# Patient Record
Sex: Female | Born: 1935
Health system: Southern US, Community
[De-identification: ages and names within clinical notes are randomized; demographics above are authoritative.]

## PROBLEM LIST (undated history)

## (undated) DIAGNOSIS — L509 Urticaria, unspecified: Secondary | ICD-10-CM

## (undated) DIAGNOSIS — Z923 Personal history of irradiation: Secondary | ICD-10-CM

## (undated) DIAGNOSIS — K635 Polyp of colon: Secondary | ICD-10-CM

## (undated) DIAGNOSIS — Z952 Presence of prosthetic heart valve: Secondary | ICD-10-CM

## (undated) DIAGNOSIS — I739 Peripheral vascular disease, unspecified: Secondary | ICD-10-CM

## (undated) DIAGNOSIS — E039 Hypothyroidism, unspecified: Secondary | ICD-10-CM

## (undated) DIAGNOSIS — I509 Heart failure, unspecified: Secondary | ICD-10-CM

## (undated) DIAGNOSIS — I350 Nonrheumatic aortic (valve) stenosis: Secondary | ICD-10-CM

## (undated) DIAGNOSIS — R319 Hematuria, unspecified: Secondary | ICD-10-CM

## (undated) DIAGNOSIS — M8580 Other specified disorders of bone density and structure, unspecified site: Secondary | ICD-10-CM

## (undated) DIAGNOSIS — I4891 Unspecified atrial fibrillation: Secondary | ICD-10-CM

## (undated) DIAGNOSIS — I10 Essential (primary) hypertension: Secondary | ICD-10-CM

## (undated) DIAGNOSIS — Z954 Presence of other heart-valve replacement: Secondary | ICD-10-CM

## (undated) DIAGNOSIS — I779 Disorder of arteries and arterioles, unspecified: Secondary | ICD-10-CM

## (undated) DIAGNOSIS — Z9981 Dependence on supplemental oxygen: Secondary | ICD-10-CM

## (undated) DIAGNOSIS — C3430 Malignant neoplasm of lower lobe, unspecified bronchus or lung: Secondary | ICD-10-CM

## (undated) DIAGNOSIS — G2581 Restless legs syndrome: Secondary | ICD-10-CM

## (undated) HISTORY — PX: KNEE ARTHROSCOPY: SUR90

## (undated) HISTORY — PX: CATARACT EXTRACTION: SUR2

## (undated) HISTORY — DX: Dependence on supplemental oxygen: Z99.81

## (undated) HISTORY — DX: Urticaria, unspecified: L50.9

## (undated) HISTORY — DX: Hematuria, unspecified: R31.9

## (undated) HISTORY — PX: FOOT SURGERY: SHX648

## (undated) HISTORY — DX: Peripheral vascular disease, unspecified: I73.9

## (undated) HISTORY — DX: Hypercalcemia: E83.52

## (undated) HISTORY — DX: Presence of other heart-valve replacement: Z95.4

## (undated) HISTORY — DX: Polyp of colon: K63.5

## (undated) HISTORY — DX: Other specified disorders of bone density and structure, unspecified site: M85.80

## (undated) HISTORY — DX: Unspecified atrial fibrillation: I48.91

## (undated) HISTORY — DX: Hypothyroidism, unspecified: E03.9

## (undated) HISTORY — DX: Restless legs syndrome: G25.81

## (undated) HISTORY — PX: CHOLECYSTECTOMY: SHX55

## (undated) HISTORY — DX: Nonrheumatic aortic (valve) stenosis: I35.0

## (undated) HISTORY — DX: Presence of prosthetic heart valve: Z95.2

## (undated) HISTORY — PX: BREAST LUMPECTOMY: SHX2

## (undated) HISTORY — DX: Essential (primary) hypertension: I10

## (undated) HISTORY — DX: Disorder of arteries and arterioles, unspecified: I77.9

---

## 1998-03-29 ENCOUNTER — Other Ambulatory Visit: Admission: RE | Admit: 1998-03-29 | Discharge: 1998-03-29 | Payer: Self-pay | Admitting: *Deleted

## 1999-05-28 ENCOUNTER — Other Ambulatory Visit: Admission: RE | Admit: 1999-05-28 | Discharge: 1999-05-28 | Payer: Self-pay | Admitting: Oral Surgery

## 1999-07-04 ENCOUNTER — Other Ambulatory Visit: Admission: RE | Admit: 1999-07-04 | Discharge: 1999-07-04 | Payer: Self-pay | Admitting: *Deleted

## 1999-12-02 ENCOUNTER — Encounter: Admission: RE | Admit: 1999-12-02 | Discharge: 1999-12-02 | Payer: Self-pay | Admitting: *Deleted

## 1999-12-02 ENCOUNTER — Encounter: Payer: Self-pay | Admitting: *Deleted

## 2001-03-07 ENCOUNTER — Ambulatory Visit (HOSPITAL_COMMUNITY): Admission: RE | Admit: 2001-03-07 | Discharge: 2001-03-07 | Payer: Self-pay | Admitting: Gastroenterology

## 2002-03-07 ENCOUNTER — Encounter: Admission: RE | Admit: 2002-03-07 | Discharge: 2002-03-07 | Payer: Self-pay | Admitting: Internal Medicine

## 2002-03-07 ENCOUNTER — Encounter: Payer: Self-pay | Admitting: Internal Medicine

## 2002-08-22 ENCOUNTER — Encounter: Payer: Self-pay | Admitting: General Surgery

## 2002-08-28 ENCOUNTER — Encounter: Payer: Self-pay | Admitting: General Surgery

## 2002-08-28 ENCOUNTER — Encounter (INDEPENDENT_AMBULATORY_CARE_PROVIDER_SITE_OTHER): Payer: Self-pay | Admitting: Specialist

## 2002-08-28 ENCOUNTER — Observation Stay (HOSPITAL_COMMUNITY): Admission: RE | Admit: 2002-08-28 | Discharge: 2002-08-29 | Payer: Self-pay | Admitting: General Surgery

## 2007-03-07 ENCOUNTER — Inpatient Hospital Stay (HOSPITAL_COMMUNITY): Admission: EM | Admit: 2007-03-07 | Discharge: 2007-03-10 | Payer: Self-pay | Admitting: Emergency Medicine

## 2007-09-15 ENCOUNTER — Encounter: Admission: RE | Admit: 2007-09-15 | Discharge: 2007-09-15 | Payer: Self-pay | Admitting: Internal Medicine

## 2007-12-08 ENCOUNTER — Encounter: Admission: RE | Admit: 2007-12-08 | Discharge: 2007-12-08 | Payer: Self-pay | Admitting: Internal Medicine

## 2008-10-20 ENCOUNTER — Emergency Department (HOSPITAL_COMMUNITY): Admission: EM | Admit: 2008-10-20 | Discharge: 2008-10-20 | Payer: Self-pay | Admitting: Emergency Medicine

## 2010-08-25 LAB — URINALYSIS, ROUTINE W REFLEX MICROSCOPIC
Glucose, UA: NEGATIVE mg/dL
Hgb urine dipstick: NEGATIVE
Ketones, ur: NEGATIVE mg/dL
Nitrite: NEGATIVE
pH: 6.5 (ref 5.0–8.0)

## 2010-09-30 NOTE — Consult Note (Signed)
[name], [name]                 ACCOUNT NO.:  [id number]   MEDICAL RECORD NO.:  [id number]          PATIENT TYPE:  INP   LOCATION:  8469                         FACILITY:  [facility]   PHYSICIAN:  Louis Meckel, M.D.DATE OF BIRTH:  1929-07-29   DATE OF CONSULTATION:  03/08/2007  DATE OF DISCHARGE:                                 CONSULTATION   REQUESTING PHYSICIAN:  Ashby Dawes. Polite, M.D.   REASON FOR REFERRAL:  Acute renal failure.   HISTORY OF PRESENT ILLNESS:  [name] is a 77 year old white female with  a past medical history only significant for hypothyroidism,  hypertension, and anemia who went to her primary care physician with  nausea and vomiting and discovered to be hypercalcemic and also with  acute renal failure with a creatinine in the mid 2s with an unknown  baseline.  She was sent to the emergency department and was admitted.  With hydration, calcium has decreased from greater than 15 down to 13.9.  Calcium remains 2.46, so we are consulted.  The patient reports at home  took 1200 mg of calcium, 800 mEq of vitamin B, unit of vitamin D, as  well as her multivitamin with calcium.  She has been having some trouble  with constipation of late and also nausea and vomiting as above.  She  had a 10 pound weight loss, but this could have been secondary to her  most recent symptoms.   PAST MEDICAL HISTORY:  1. Hypothyroidism.  2. Anemia.  3. Hypertension.   MEDICATIONS:  Given here include:  1. Baby aspirin.  2. Cipro 250 mg q.8 hours.  3. Synthroid 125 mcg daily.  4. Multivitamins.   ALLERGIES:  PENICILLIN.   SOCIAL HISTORY:  The patient is very functional.  She smokes a pack per  day and has done so for years.  She denies any alcohol.   FAMILY HISTORY:  Negative for renal disease.   REVIEW OF SYSTEMS:  Positive for abdominal pain, nausea, vomiting,  constipation, and fatigue.  Negative for shortness of breath, chest  pain, lower extremity edema, urinary  symptoms, headache, or decreased  appetite.   PHYSICAL EXAMINATION:  VITAL SIGNS:  The patient is afebrile, blood  pressure 124/66, heart rate 61, respirations 17, oxygen saturation is  97% on room air.  No urine output recorded, though patient states that  she has been voiding.  GENERAL:  Pleasant, thin, white female in no acute distress.  HEENT:  Pupils equal, round, reactive to light.  Extraocular muscles  intact.  NECK:  No jugular venous distention.  No carotid bruits.  No  lymphadenopathy.  LUNGS:  Clear to auscultation bilaterally without wheezes.  CARDIOVASCULAR:  Regular rate and rhythm with a positive systolic  ejection murmur.  ABDOMEN:  Positive bowel sounds, distended, but no particularly painful.  EXTREMITIES:  Revealed no cyanosis, clubbing, or edema.  NEURO:  The patient is alert and oriented and neuro exam is nonfocal.   LABORATORY DATA:  Hemoglobin 12.6.  BUN and creatinine 47 and 2.46,  potassium 2.9, total  protein is 5.7, albumin is 2.9,  calcium was  initially greater than 15 and now is 13.9.  Urinalysis is negative for  protein, but does have 11-20 WBCs per high power field.   IMAGING:  Renal ultrasound is pending.   ASSESSMENT:  A 77 year old white female with acute renal failure  associated with hypercalcemia.  1. Acute renal failure, most likely associated with hypercalcemia.  I      have no old creatinine values to compare this to.  Urine is without      proteinuria, but she does have pyuria and is currently on Cipro.      Urine culture is pending.  Renal ultrasound is pending to rule out      obstruction.  I will check an SPEP and UPEP as well.  2. Hypercalcemia.  Could be secondary to oral calcium vitamin D and      HCTZ at home, but agree with checking for more ominous causes.  PTH      has been checked.  Chest x-ray will be checked and also we will      check and SPEP and UPEP.  I will give a dose of pamidronate today      to speed up the process of  improving the calcium.  We will continue      fluids at 125 an hour.  3. Hypokalemia.  Possibly secondary to decreased p.o. intake.  She was      taking MiraLax for constipation.  We will attempt to replete      potassium.   Thank you very much for this consult, we will continue to follow with  you.           ______________________________  Louis Meckel, M.D.     KAG/MEDQ  D:  03/08/2007  T:  03/09/2007  Job:  [id number]

## 2010-09-30 NOTE — Discharge Summary (Signed)
[name], [name]                 ACCOUNT NO.:  [id number]   MEDICAL RECORD NO.:  [id number]          PATIENT TYPE:  INP   LOCATION:  3016                         FACILITY:  [facility]   PHYSICIAN:  Wenda Low, MD      DATE OF BIRTH:  12/21/1929   DATE OF ADMISSION:  03/07/2007  DATE OF DISCHARGE:  03/10/2007                               DISCHARGE SUMMARY   DISCHARGE DIAGNOSES:  1. Acute renal failure secondary to hypercalcemia.  2. Hypercalcemia.  3. Hypokalemia.  4. Hypertension.  5. Hypothyroidism.  6. Restless leg syndrome.  7. Tobacco use.  8. Urinary tract infection.   MEDICATIONS ON DISCHARGE:  1. Synthroid 125 mcg daily.  2. Norvasc 10 mg daily.  3. Aspirin 81 mg daily.  4. Fosamax 20 mg once a week.  5. Vitamin D 800 units per day.  6. Multivitamin 1 a day.  7. No calcium products.   LABORATORY DATA:  Calcium 10.2, normal LFTs, potassium 3.8, creatinine  1.7, white count of 9.3, hemoglobin 12.6, TSH 4.07, total protein 6.6,  protein electrophoresis pending, parathyroid level 10.8, iron 69.   PROCEDURES:  CT with renal protocol:  No evidence of any hydronephrosis  or stones.  A small 4 mm stone on the left side in the calyx.  Ultrasound of the renal showed normal renal kidney size.   CONSULTATIONS:  Nephrology and urology.   HOSPITAL COURSE:  This is a 77 year old female with a history of  hypertension, osteopenia, hypothyroidism, tobacco use admitted with  weakness, hypercalcemia, acute renal failure from the office.  1. Acute renal failure, creatinine was 2.4.  Patient was admitted and      started on IV fluids.  Renal ultrasound was obtained.  It initially      showed some questionable hydronephrosis on the right and normal      sized kidneys.  Patient had a renal consult.  Was aggressively      hydrated and thought to be renal failure secondary to      hypercalcemia.  Patient did improve renal function.  Creatinine at      this time is 1.7.  It is  continuing to improve.  2. Hypercalcemia.  Patient had been on calcium supplement with      hydrochlorothiazide and dehydration.  She had a workup including      the chest x-ray that was negative.  Renal ultrasound was normal      renal size and her PTH level was 10.8.  Serum electrophoresis and      SPEP was obtained.  SPEP was normal.  Electrophoresis proteins are      pending.  The patient did receive a dose of pamidronate 60 mg and      her calcium came back normal with IV fluids, aggressive hydration.      At the time of discharge, it was 10.2.  Patient will avoid any      calcium products for now.  3. There was questionable right hydronephrosis seen on the ultrasound.      Urology was consulted because of her history  of kidney stones in      the past.  CT renal protocol was obtained.  Did not show any      evidence of hydronephrosis and no stones on the right side.  4. As far as her UTI, patient had urine cultures with E. Coli.  She      was given a course of Cipro for 3 days from October 21 to October      23.  5. As far as hypertension, continued on Norvasc.  Hydrochlorothiazide      will be stopped.  She has already been stopped on that at home.  6. As far as her constipation, it was thought to be secondary to her      iron pills.  Required some MiraLax.  7. As far as her hypothyroidism, continue on thyroid.  Her TSH was      normal.  We will see her back in the office in 1 week and followup      on her renal function.      Wenda Low, MD  Electronically Signed     KH/MEDQ  D:  03/10/2007  T:  03/10/2007  Job:  [id number]

## 2010-09-30 NOTE — H&P (Signed)
[name], [name]                 ACCOUNT NO.:  [id number]   MEDICAL RECORD NO.:  [id number]          PATIENT TYPE:  EMS   LOCATION:  MAJO                         FACILITY:  [facility]   PHYSICIAN:  [name], M.D.DATE OF BIRTH:  07/31/1929   DATE OF ADMISSION:  03/07/2007  DATE OF DISCHARGE:                              HISTORY & PHYSICAL   PRIMARY CARE PHYSICIAN:  [name], M.D.   CHIEF COMPLAINT:  Nausea and vomiting.   HISTORY OF PRESENT ILLNESS:  The patient is a 77 year old white female  with past medical history of hypothyroidism and iron deficiency anemia  who presents to the emergency room after being sent over by her PCP.  Over the last several weeks, she is having problems with nausea and  vomiting and progressively worsening weakness.  Initially she thought  this might be a viral gastroenteritis, however, when symptoms persisted  she became concerned and came to see her doctor.  Her doctor ordered  some routine blood work which came back showing a markedly elevated  calcium level of greater than 15 as well as evidence of acute renal  failure.   Dr. Delfina Rich, her PCP was concerned about the possibility of hypercalcemia  of various etiology and had the patient go into the emergency room for  further evaluation.  In the emergency room, lab work confirmed a calcium  level of greater than 15.  Her albumin was found to be normal at 3.7.  The patient was started on aggressive IV hydration and she was also  found to be in renal failure with a creatinine of 2.3 and elevated BUN.  Currently she complains of feeling weak.  She says her nausea has  currently passed.  She denies any headache, vision changes, dysphagia.  No current chest pain, palpitations, shortness of breath, wheeze, cough.  No abdominal pain.  No hematuria or dysuria.  No diarrhea, but she does  complain of constipation, focal extremity numbness, weakness, or pain.  Overall she feels quite  fatigued.   REVIEW OF SYSTEMS:  Otherwise negative.   PAST MEDICAL HISTORY:  Hypertension and hypothyroidism, iron deficiency  anemia.   MEDICATIONS:  1. Calcium.  2. Hydrochlorothiazide.  3. Iron.  4. Synthroid.  5. Multivitamin.  6. Norvasc.   SOCIAL HISTORY:  She denies any alcohol or drug use.  She does smoke  about half pack a day.   FAMILY HISTORY:  Noncontributory.   PHYSICAL EXAMINATION:  VITAL SIGNS:  Temperature 98.6, heart rate 86,  blood pressure 166/76, respiratory rate 20, O2 saturations 98% on room  air.  GENERAL:  She is alert and oriented x3 in no acute distress.  HEENT:  Normocephalic and atraumatic.  Mucous membranes are dry.  She  has no carotid bruits.  HEART:  Regular rate and rhythm with a 3/6 holosystolic murmur.  LUNGS:  Decreased breath sounds throughout.  ABDOMEN:  Soft, nontender, and nondistended.  Positive bowel sounds.  EXTREMITIES:  No cyanosis, clubbing, or edema.   LABORATORY DATA:  White count 9.3, H&H 12.6 and 37, MCV 96, platelet  count 249, no  shift.  UA notes moderate leukocyte esterase and 11-20  white cells, but with few bacteria.  Sodium 137, potassium 2.6, chloride  95, bicarb 34, BUN 48, creatinine 2.4, glucose 133.  Her LFT's are  essentially normal with exception of a calcium level being greater than  15.   ASSESSMENT:  1. Hypercalcemia.  We will begin hypercalcemia workup including an      intact PTH, phosphorus level, and ionized calcium.  This may be      primary hyperparathyroidism versus secondary versus other etiology.      In the meantime we will aggressively hydrate and recheck levels in      the morning.  If her level is still markedly elevated and she is      better hydrated, we will consider the possibilities of calcium      binder.  2. Hypertension, currently holding her medication because of her renal      failure.  3. Acute renal failure secondary to severe volume depletion.  We will      aggressively hydrate  and follow her labs in the morning.  4. Hypothyroidism.  We will need to get her dose of Synthroid.  5. Urinary tract infection.  We will start her on Cipro.  6. Tobacco abuse.  The patient has brought her nicotine patches with      her.      [name], M.D.  Electronically Signed     SKK/MEDQ  D:  03/07/2007  T:  03/07/2007  Job:  [id number]   cc:   [name], M.D.

## 2010-10-03 NOTE — Op Note (Signed)
NAME:  [name], [name]                           ACCOUNT NO.:  [id number]   MEDICAL RECORD NO.:  [id number]                   PATIENT TYPE:  AMB   LOCATION:  DAY                                  [facility]ACILITY:  [facility]   PHYSICIAN:  Orson Ape. Rise Patience, M.D.          DATE OF BIRTH:  Jan 08, 1930   DATE OF PROCEDURE:  08/28/2002  DATE OF DISCHARGE:                                 OPERATIVE REPORT   PREOPERATIVE DIAGNOSES:  Chronic cholecystitis with stones.   POSTOPERATIVE DIAGNOSES:  Chronic cholecystitis with stones.   OPERATION:  Laparoscopic cholecystectomy with cholangiogram.   ANESTHESIA:  General.   HISTORY:  [name] is a 77 year old white female who was referred to me  by Dr. Arnoldo Morale for symptomatic gallstones. The patient's pain is a little  lower __________  detect, but she is very thin and has a low lying  gallbladder.  I think she has had a CAT scan. They repeated the ultrasound  and she has known she has had a stone for a long time and it did show that  there is a single gallstone. She was referred to me for a total cystectomy.  The patient has had a previous colonoscopy and there was some question about  an abnormal chest x-ray but the patient also says that she has had a  previous CAT scan of the chest for evaluation of the same. Her liver  function studies and CBC were normal.   DESCRIPTION OF PROCEDURE:  Preoperatively, she was given 400 mg of Cipro,  taken to the operative suite, induction of general anesthesia and  endotracheal tube. An oral tube was placed into the stomach. The abdomen  which was quite thin was prepped with Betadine surgical scrub and solution  and draped in a sterile manner. A small incision was made below the  umbilicus and fascia picked up between two Kochers, carefully opening into  the peritoneal cavity. A pursestring suture was placed and the Hasson  cannula introduced. The gallbladder is lying quite low, the upper 10 mm  trocar is placed in  the subxiphoid area and the two lateral 5 mm trocars  even though they were placed low, it was kind of having to flip the liver  and gallbladder up for exposure. The cystic duct was identified as was the  cystic artery. The cystic artery was doubly clipped proximally, singly,  distally and divided and then I encompassed the cystic duct and it was  clipped flush with the gallbladder. A small opening was made and it was  noted that the pressure within the cystic duct is a little higher than usual  and the cholangiogram catheter was inserted and an x-ray was obtained. The  dye kind of went into the intrahepatic radicles and shows kind of a dilated  common bile duct within the most distal portion with a nice gradual taper  with no evidence of any stones including flow  into the duodenum. The cystic  duct was triply clipped and then divided and then the gallbladder was  carefully dissected from its bed. There was a little spillage of bile. The  gallbladder was placed in an EndoCatch bag and then after good hemostasis  and irrigating fluid had been aspirated, a camera switched to the upper 10  mm port and the gallbladder withdrawn. It was a single stone probably about  3 mm in size in the gallbladder as noted on the ultrasound. The irrigating  fluid was aspirated, I plotted the second figure-of-eight at the umbilicus  and tied both fascial stitches and then the lateral 5 mm trocars withdrawn  after the irrigating fluid had been removed. The carbon dioxide released and  the upper 10 mm trocar withdrawn under direct vision. I did place a figure-  of-eight in the anterior fascia of #0 Vicryl and then the subcutaneous  wounds with 4-0 Vicryl and then Benzoin and Steri-Strips on the skin. The  patient tolerated the procedure nicely. I discussed with the patient's  family that if she would continue to have these episodes  of pain, that it may be a little ampullary stenosis or spasm contributing to  this  down the dilating common bile duct, but hopefully she will have no  further symptoms. The patient was extubated and taken to the recovery room  in a stable postop condition.                                               Orson Ape. Rise Patience, M.D.    WJW/MEDQ  D:  08/28/2002  T:  08/28/2002  Job:  [id number]   cc:   Deborah Chalk, M.D.  [address], [address]  [place]  [phone]  Fax: [phone]

## 2011-01-08 ENCOUNTER — Ambulatory Visit (HOSPITAL_COMMUNITY)
Admission: RE | Admit: 2011-01-08 | Discharge: 2011-01-08 | Disposition: A | Discharge status: Home / Self Care | Payer: Medicare Other | Source: Ambulatory Visit | Attending: Internal Medicine | Admitting: Internal Medicine

## 2011-01-08 DIAGNOSIS — J4489 Other specified chronic obstructive pulmonary disease: Secondary | ICD-10-CM | POA: Insufficient documentation

## 2011-01-08 DIAGNOSIS — J449 Chronic obstructive pulmonary disease, unspecified: Secondary | ICD-10-CM | POA: Insufficient documentation

## 2011-02-12 ENCOUNTER — Inpatient Hospital Stay (HOSPITAL_BASED_OUTPATIENT_CLINIC_OR_DEPARTMENT_OTHER)
Admission: RE | Admit: 2011-02-12 | Discharge: 2011-02-12 | Disposition: A | Discharge status: Home / Self Care | Payer: Medicare Other | Source: Ambulatory Visit | Attending: Interventional Cardiology | Admitting: Interventional Cardiology

## 2011-02-12 DIAGNOSIS — R0989 Other specified symptoms and signs involving the circulatory and respiratory systems: Secondary | ICD-10-CM | POA: Insufficient documentation

## 2011-02-12 DIAGNOSIS — I359 Nonrheumatic aortic valve disorder, unspecified: Secondary | ICD-10-CM | POA: Insufficient documentation

## 2011-02-12 DIAGNOSIS — R0609 Other forms of dyspnea: Secondary | ICD-10-CM | POA: Insufficient documentation

## 2011-02-12 LAB — POCT I-STAT 3, VENOUS BLOOD GAS (G3P V)
Acid-Base Excess: 2 mmol/L (ref 0.0–2.0)
O2 Saturation: 66 %
pCO2, Ven: 55.1 mmHg — ABNORMAL HIGH (ref 45.0–50.0)
pH, Ven: 7.334 — ABNORMAL HIGH (ref 7.250–7.300)
pO2, Ven: 37 mmHg (ref 30.0–45.0)

## 2011-02-12 LAB — POCT I-STAT 3, ART BLOOD GAS (G3+)
Acid-Base Excess: 4 mmol/L — ABNORMAL HIGH (ref 0.0–2.0)
Bicarbonate: 30.7 mEq/L — ABNORMAL HIGH (ref 20.0–24.0)
O2 Saturation: 95 %
pH, Arterial: 7.379 (ref 7.350–7.400)

## 2011-02-25 LAB — COMPREHENSIVE METABOLIC PANEL
ALT: 19
ALT: 22
AST: 24
AST: 24
Albumin: 2.9 — ABNORMAL LOW
Albumin: 3.7
Alkaline Phosphatase: 46
Alkaline Phosphatase: 52
BUN: 47 — ABNORMAL HIGH
BUN: 48 — ABNORMAL HIGH
CO2: 31
CO2: 34 — ABNORMAL HIGH
Calcium: 13.9
Calcium: >15
Chloride: 103
Chloride: 95 — ABNORMAL LOW
Creatinine, Ser: 2.36 — ABNORMAL HIGH
Creatinine, Ser: 2.46 — ABNORMAL HIGH
GFR calc Af Amer: 23 — ABNORMAL LOW
GFR calc Af Amer: 24 — ABNORMAL LOW
GFR calc non Af Amer: 19 — ABNORMAL LOW
GFR calc non Af Amer: 20 — ABNORMAL LOW
Glucose, Bld: 133 — ABNORMAL HIGH
Glucose, Bld: 91
Potassium: 2.4 — CL
Potassium: 2.6 — CL
Sodium: 137
Sodium: 142
Total Bilirubin: 0.5
Total Bilirubin: 0.6
Total Protein: 5.7 — ABNORMAL LOW
Total Protein: 7.2

## 2011-02-25 LAB — DIFFERENTIAL
Basophils Absolute: 0
Basophils Relative: 0
Eosinophils Absolute: 0.2
Eosinophils Relative: 2
Lymphocytes Relative: 17
Lymphs Abs: 1.6
Monocytes Absolute: 0.7
Monocytes Relative: 7
Neutro Abs: 6.8
Neutrophils Relative %: 74

## 2011-02-25 LAB — URINALYSIS, ROUTINE W REFLEX MICROSCOPIC
Bilirubin Urine: NEGATIVE
Glucose, UA: NEGATIVE
Ketones, ur: NEGATIVE
Nitrite: NEGATIVE
Protein, ur: NEGATIVE
Specific Gravity, Urine: 1.011
Urobilinogen, UA: 0.2
pH: 7

## 2011-02-25 LAB — BASIC METABOLIC PANEL
BUN: 29 — ABNORMAL HIGH
BUN: 44 — ABNORMAL HIGH
CO2: 25
CO2: 32
Calcium: 12.9 — ABNORMAL HIGH
Chloride: 111
Chloride: 99
Creatinine, Ser: 1.72 — ABNORMAL HIGH
Creatinine, Ser: 2.19 — ABNORMAL HIGH
GFR calc Af Amer: 27 — ABNORMAL LOW
GFR calc Af Amer: 35 — ABNORMAL LOW
GFR calc non Af Amer: 22 — ABNORMAL LOW
Glucose, Bld: 139 — ABNORMAL HIGH
Glucose, Bld: 99
Potassium: 2.9 — ABNORMAL LOW
Sodium: 137

## 2011-02-25 LAB — UIFE/LIGHT CHAINS/TP QN, 24-HR UR
Albumin, U: DETECTED
Alpha 1, Urine: DETECTED — AB
Alpha 2, Urine: DETECTED — AB
Beta, Urine: DETECTED — AB
Free Kappa Lt Chains,Ur: 9.82 — ABNORMAL HIGH (ref 0.04–1.51)
Free Kappa/Lambda Ratio: 5.74 — ABNORMAL HIGH (ref 0.46–4.00)
Free Lambda Excretion/Day: 52.16
Free Lambda Lt Chains,Ur: 1.71 — ABNORMAL HIGH (ref 0.08–1.01)
Free Lt Chn Excr Rate: 299.51
Gamma Globulin, Urine: DETECTED — AB
Time: 24
Total Protein, Urine-Ur/day: 406 — ABNORMAL HIGH (ref 10–140)
Total Protein, Urine: 13.3
Volume, Urine: 3050

## 2011-02-25 LAB — URINE CULTURE

## 2011-02-25 LAB — PROTEIN ELECTROPHORESIS, SERUM
Albumin ELP: 53.8 — ABNORMAL LOW
Alpha-1-Globulin: 6 — ABNORMAL HIGH
Alpha-2-Globulin: 13.4 — ABNORMAL HIGH
Beta 2: 5.8
Beta Globulin: 5.9
Gamma Globulin: 15.1
M-Spike, %: NOT DETECTED
Total Protein ELP: 6.6

## 2011-02-25 LAB — CBC
HCT: 36.7
MCHC: 34.4
WBC: 9.3

## 2011-02-25 LAB — PTH, INTACT AND CALCIUM
Calcium, Total (PTH): 14.9
PTH: 10.8 — ABNORMAL LOW

## 2011-02-25 LAB — RENAL FUNCTION PANEL
Albumin: 2.6 — ABNORMAL LOW
BUN: 39 — ABNORMAL HIGH
Calcium: 11.4 — ABNORMAL HIGH
Creatinine, Ser: 1.87 — ABNORMAL HIGH
GFR calc Af Amer: 32 — ABNORMAL LOW
Phosphorus: 2.3

## 2011-02-25 LAB — PHOSPHORUS: Phosphorus: 3.9

## 2011-02-25 LAB — CALCIUM: Calcium: 14.8

## 2011-02-25 LAB — URINE MICROSCOPIC-ADD ON

## 2011-02-26 NOTE — Cardiovascular Report (Signed)
NAMEBRAYLINN, GULDEN NO.:  [id number]  MEDICAL RECORD NO.:  [id number]  LOCATION:                                 FACILITY:  PHYSICIAN:  Belva Crome, M.D.   DATE OF BIRTH:  17-Dec-1929  DATE OF PROCEDURE:  02/12/2011 DATE OF DISCHARGE:                           CARDIAC CATHETERIZATION   INDICATION FOR THIS PROCEDURE:  Documentation of severe aortic stenosis and severe dyspnea on exertion.  The patient has pulmonary obstructive disease due to prior history of smoking.  We tried to distinguish between cardiogenic versus pulmonary dyspnea and whether or not the aortic valve should be treated with surgery.  PROCEDURE PERFORMED: 1. Left heart cath. 2. Right heart cath. 3. Coronary angiography. 4. Left ventriculography.  DESCRIPTION:  Informed consent, a 4-French sheath was placed in the right femoral artery and a 7-French sheath in the right femoral vein after 1% Xylocaine local infiltration.  The patient received 1 mg of Versed and 50 mcg of fentanyl before proceeding with the cath.  Right heart pressures were measured with a Swan-Ganz catheter.  Left heart pressures were measured with a 4-French A2 multipurpose catheter. We crossed the aortic valve for the straight wire.  We performed left ventriculography by hand injection.  We performed coronary angiography using preformed Judkins #4 4-French catheters.  The patient tolerated the procedure without complications.  RESULTS: 1. Hemodynamic data:     a.     Aortic pressure 123/61.     b.     Left ventricular pressure 214/11 mmHg.     c.     Right atrial mean pressure 3 mmHg.     d.     Right ventricular pressure 30/5 mmHg.     e.     Pulmonary artery pressure of 30/10 mmHg.     f.     Capillary wedge pressure mean 7 mmHg.     g.     Aortic valve gradient 73 mmHg (mean).  Cardiac output 3.4      liters per minute by Fick output and 3.0 liters per minute by      thermodilution output. Cardiac output  0.37 cm2 by Fick output and 0.33 cm2 by thermodilution output. 1. Left ventriculography:  LV cavity size and function are normal.  EF     is 70%. 2. Coronary angiography:  The coronaries are basically widely patent,     but very tortuous.     a.     Left main coronary:  Widely patent.     b.     Left anterior descending coronary:  LAD wraps around the      left ventricular apex.  After the second diagonal branch in the      distal segment, there is either a cake or a 50-70% stenosis before      the vessel becomes very tortuous.  It is just beyond part of the      trifurcation of the septal perforator of the diagonal.  Other than      this region in the distal LAD, no significant obstruction is      noted.     c.  Circumflex artery:  Circumflex coronary artery is very      tortuous as mild luminal irregularities and generally widely      patent.     d.     Right coronary:  The right coronary artery contains proximal      to midvessel eccentric 40-50% stenosis in the PDA and left      ventricular branch were widely patent.  Vessel is a very tortuous      distally.  No significant obstruction seen.  CONCLUSION: 1. Critical aortic stenosis, calcific. 2. Essentially widely patent coronary arteries with possible kink     versus atherosclerotic obstruction in the distal LAD beyond the     second diagonal luminal irregularities noted in the proximal LAD     and proximal RCA. 3. Normal left ventricular function.  PLAN:  Evaluate pulmonary function studies, refer to pulmonologist for presurgical evaluation.  Refer to cardiac surgeon consideration of aortic valve replacement after pulmonary evaluation is completed.     Belva Crome, M.D.     HWS/MEDQ  D:  02/12/2011  T:  02/12/2011  Job:  [id number]  cc:   Nehemiah Settle, M.D.  Electronically Signed by Daneen Schick M.D. on 02/26/2011 11:02:57 AM

## 2011-03-02 ENCOUNTER — Encounter: Payer: Self-pay | Admitting: Pulmonary Disease

## 2011-03-02 ENCOUNTER — Ambulatory Visit (INDEPENDENT_AMBULATORY_CARE_PROVIDER_SITE_OTHER): Payer: Medicare Other | Admitting: Pulmonary Disease

## 2011-03-02 VITALS — BP 102/76 | HR 88 | Temp 98.2°F | Ht 64.0 in | Wt 128.2 lb

## 2011-03-02 DIAGNOSIS — J441 Chronic obstructive pulmonary disease with (acute) exacerbation: Secondary | ICD-10-CM | POA: Insufficient documentation

## 2011-03-02 DIAGNOSIS — J449 Chronic obstructive pulmonary disease, unspecified: Secondary | ICD-10-CM | POA: Insufficient documentation

## 2011-03-02 DIAGNOSIS — J439 Emphysema, unspecified: Secondary | ICD-10-CM | POA: Insufficient documentation

## 2011-03-02 NOTE — Assessment & Plan Note (Signed)
The patient has severe airflow obstruction by her recent PFTs, but has a 24% improvement in her FEV1 with bronchodilators.  She has also quit smoking, and remains fairly functional.  She obviously needs to be started on an aggressive bronchodilator regimen in order to tune her up as much as possible in preparation for aortic valve surgery.  I have told her that she has at least moderate risk for postoperative pulmonary complications, but feel they are manageable.  She will need early mobilization, and also aggressive pulmonary toilet.  She understands that she may have a longer mechanical ventilation course, and that she is at increased risk for pneumonia postoperatively.  Will start her on Spiriva and symbicort, and will followup with her in the hospital after her surgery.

## 2011-03-02 NOTE — Progress Notes (Signed)
  Subjective:    Patient ID: [name], female    DOB: May 16, 1930, 77 y.o.   MRN: [id number]  HPI The patient is a 77 year old female who I've been asked to see for pulmonary clearance prior to aortic valve surgery.  She has a long history of smoking, but quit in February of this year.  She has had recent pulmonary function studies that show severe airflow obstruction, but there is a 24% improvement with bronchodilators.  She is currently not on an inhaler regimen.  She has had a recent chest x-ray that shows hyperinflation, as well as scarring at the left hilum which dates back at least until 2010.  The patient states that she has remained fairly functional by doing physical activities at a slower pace.  She can do paced light housework, and we'll cut her grass with a riding lawnmower.  She can get winded walking her driveway to get the mail if she tries to walk at a faster pace.  She denies any significant cough, and only occasional mucus production.  She has no chest tightness or discomfort.  She has not had a recent pulmonary infection, nor COPD exacerbation.   Review of Systems  Constitutional: Positive for unexpected weight change. Negative for fever.  HENT: Positive for sneezing. Negative for ear pain, nosebleeds, congestion, sore throat, rhinorrhea, trouble swallowing, dental problem, postnasal drip and sinus pressure.   Eyes: Negative for redness and itching.  Respiratory: Positive for shortness of breath. Negative for cough, chest tightness and wheezing.   Cardiovascular: Positive for palpitations and leg swelling.  Gastrointestinal: Negative for nausea and vomiting.  Genitourinary: Negative for dysuria.  Musculoskeletal: Negative for joint swelling.  Skin: Negative for rash.  Neurological: Negative for headaches.  Hematological: Does not bruise/bleed easily.  Psychiatric/Behavioral: Negative for dysphoric mood. The patient is nervous/anxious.        Objective:   Physical  Exam Constitutional:  Thin female, no acute distress  HENT:  Nares patent without discharge  Oropharynx without exudate, palate and uvula are normal  Eyes:  Perrla, eomi, no scleral icterus  Neck:  No JVD, no TMG  Cardiovascular:  Normal rate, regular rhythm, no rubs or gallops.  3/6 SEM        Intact distal pulses  Pulmonary :  decreased breath sounds, no stridor or respiratory distress   No rales, rhonchi, or wheezing  Abdominal:  Soft, nondistended, bowel sounds present.  No tenderness noted.   Musculoskeletal:  mild lower extremity edema noted.  Lymph Nodes:  No cervical lymphadenopathy noted  Skin:  No cyanosis noted  Neurologic:  Alert, appropriate, moves all 4 extremities without obvious deficit.         Assessment & Plan:

## 2011-03-02 NOTE — Patient Instructions (Signed)
Start on spiriva one inhalation each am everyday Start symbicort 160/4.5  2 inhalations am and pm everyday, rinse mouth well Albuterol inhaler to be carried with you for emergencies only.  2 puffs up to every 6hrs if needed for rescue.  Will followup with you at hospital after your surgery.

## 2011-03-05 ENCOUNTER — Encounter: Payer: Self-pay | Admitting: Thoracic Surgery (Cardiothoracic Vascular Surgery)

## 2011-03-05 DIAGNOSIS — K635 Polyp of colon: Secondary | ICD-10-CM | POA: Insufficient documentation

## 2011-03-06 DIAGNOSIS — I10 Essential (primary) hypertension: Secondary | ICD-10-CM | POA: Insufficient documentation

## 2011-03-06 DIAGNOSIS — I739 Peripheral vascular disease, unspecified: Secondary | ICD-10-CM

## 2011-03-09 ENCOUNTER — Other Ambulatory Visit: Payer: Self-pay | Admitting: Thoracic Surgery (Cardiothoracic Vascular Surgery)

## 2011-03-09 ENCOUNTER — Institutional Professional Consult (permissible substitution) (INDEPENDENT_AMBULATORY_CARE_PROVIDER_SITE_OTHER): Payer: Medicare Other | Admitting: Thoracic Surgery (Cardiothoracic Vascular Surgery)

## 2011-03-09 ENCOUNTER — Encounter: Payer: Self-pay | Admitting: Thoracic Surgery (Cardiothoracic Vascular Surgery)

## 2011-03-09 VITALS — BP 127/69 | HR 70 | Resp 18 | Ht 64.0 in | Wt 125.0 lb

## 2011-03-09 DIAGNOSIS — I359 Nonrheumatic aortic valve disorder, unspecified: Secondary | ICD-10-CM

## 2011-03-09 DIAGNOSIS — I350 Nonrheumatic aortic (valve) stenosis: Secondary | ICD-10-CM

## 2011-03-09 DIAGNOSIS — I7409 Other arterial embolism and thrombosis of abdominal aorta: Secondary | ICD-10-CM

## 2011-03-09 NOTE — Progress Notes (Signed)
PCP is Horton Finer, MD, MD Referring Provider is Sinclair Grooms, MD  Chief Complaint  Patient presents with  . Aortic Stenosis    Referral from Dr Daneen Schick for surgical eval,cath'ed 02/12/2011    HPI:  Patient is a 77 year old widowed white female with known history of aortic stenosis, hypertension, COPD, and hypothyroidism. The patient states that she was first noted to have a heart murmur on physical exam any years ago. She has been followed by her primary care physician and recently began complaining of worsening symptoms of exertional shortness of breath. Her most recent echocardiogram was performed 10/23/2009. By report the revealed severe aortic stenosis with normal left ventricular systolic function. Peak and mean transvalvular gradients across the valve at that time were reportedly 72 and 48 mm mercury respectively. The peak velocity across the aortic valve was 4.23 m/s at that time. No other significant abnormalities were noted. Once the patient began developing symptoms of exertional shortness of breath, she was referred to Dr. Tamala Julian who saw her in consultation last month. She subsequently underwent elective left and right heart catheterization by Dr. Tamala Julian on 02/12/2011. This revealed essentially normal coronary artery anatomy with no significant coronary artery disease. Catheterization confirmed the presence of severe aortic stenosis with a mean transvalvular gradient of 73 mm mercury was. Aortic valve area was estimated between 0.33 and 0.37 cm. Left ventricular systolic function appeared normal. Right heart catheterization data were normal with PA pressures measured 30/10 pulmonary A wedge pressure 7. The patient has now been referred for elective aortic valve replacement.   Past Medical History  Diagnosis Date  . Hypertension   . Osteopenia   . Hypothyroid   . Aortic stenosis   . Carotid artery disease   . Hypercalcemia   . Pressure urticaria   . Allergic  rhinitis   . RLS (restless legs syndrome)   . COPD (chronic obstructive pulmonary disease)   . Colon polyps   . AS (aortic stenosis) 03/06/2011    Past Surgical History  Procedure Date  . Breast lumpectomy     Left  . Foot surgery     Morton's neuroma  . Knee arthroscopy     Left  . Cholecystectomy   . Cataract extraction     Family History  Problem Relation Age of Onset  . Emphysema Maternal Aunt   . Asthma Maternal Aunt   . Asthma Maternal Uncle   . Heart disease Maternal Grandmother   . Cancer Mother     bladder  . Lung cancer Father   . Breast cancer Sister   . Uterine cancer Sister     Social History History  Substance Use Topics  . Smoking status: Former Smoker -- 0.9 packs/day for 62 years    Types: Cigarettes    Quit date: 07/14/2010  . Smokeless tobacco: Not on file  . Alcohol Use: No    Current Outpatient Prescriptions  Medication Sig Dispense Refill  . amLODipine (NORVASC) 10 MG tablet Take 10 mg by mouth daily.        Marland Kitchen aspirin 81 MG tablet Take 81 mg by mouth daily.        . beta carotene w/minerals (OCUVITE) tablet Take 1 tablet by mouth daily.        . budesonide-formoterol (SYMBICORT) 160-4.5 MCG/ACT inhaler Inhale 2 puffs into the lungs 2 (two) times daily.        . cetirizine (ZYRTEC) 10 MG tablet Take 10 mg by mouth daily.        Marland Kitchen  Ferrous Sulfate (IRON) 325 (65 FE) MG TABS Take by mouth as directed.        Marland Kitchen levothyroxine (SYNTHROID, LEVOTHROID) 125 MCG tablet Take 125 mcg by mouth daily.        . simvastatin (ZOCOR) 20 MG tablet Take 20 mg by mouth at bedtime.        Marland Kitchen tiotropium (SPIRIVA) 18 MCG inhalation capsule Place 18 mcg into inhaler and inhale daily.        . vitamin C (ASCORBIC ACID) 500 MG tablet Take 500 mg by mouth daily.        . vitamin D, CHOLECALCIFEROL, 400 UNITS tablet Take 400 Units by mouth daily.          Allergies  Allergen Reactions  . Biaxin   . Penicillins     Review of Systems  Constitutional: Positive for  unexpected weight change. Negative for fever, chills, diaphoresis, activity change and fatigue.       Patient reports gaining approximately 5 pounds since she quit smoking.  HENT: Negative.   Eyes: Negative.   Respiratory: Positive for shortness of breath.   Cardiovascular: Positive for leg swelling. Negative for chest pain and palpitations.       Patient reports recent development of exertional shortness of breath, typically only with relatively strenuous exertion.  She denies resting shortness of breath, PND, orthopnea, and syncope.  She denies any history of chest discomfort either with activity or at rest.  Gastrointestinal: Negative.   Genitourinary: Negative.   Musculoskeletal:       Occasional cramping pain in both lower legs and feet, typically at night.  Neurological: Negative.   Hematological: Negative.   Psychiatric/Behavioral: Negative.     BP 127/69  Pulse 70  Resp 18  Ht _0  (1.626 m)  Wt 125 lb (56.7 kg)  BMI 21.46 kg/m2  SpO2 96% Physical Exam  Vitals reviewed. Constitutional: She is oriented to person, place, and time. She appears well-developed and well-nourished.  HENT:  Head: Normocephalic.  Eyes: Pupils are equal, round, and reactive to light.  Neck: Normal range of motion. No JVD present. No thyromegaly present.  Cardiovascular: Normal rate and regular rhythm.   Murmur heard.      Prominent crescendo/decrescendo systolic murmur heard best at the left lower sternal border  Pulmonary/Chest: Effort normal. She has no wheezes. She has no rales.  Abdominal: Soft. Bowel sounds are normal. She exhibits no mass. There is no tenderness.  Musculoskeletal: Normal range of motion. She exhibits no edema.  Lymphadenopathy:    She has no cervical adenopathy.  Neurological: She is alert and oriented to person, place, and time.  Skin: Skin is warm and dry.  Psychiatric: She has a normal mood and affect. Her behavior is normal. Judgment and thought content normal.      Diagnostic Tests:  Report of 2-D echocardiogram performed 10/23/2009 is reviewed. By report there was severe aortic stenosis with mild concentric left ventricular hypertrophy and grade 2 diastolic dysfunction. Left ventricular ejection fraction was estimated 60-65%. There was mild mitral annular calcification but no significant mitral regurgitation. The peak velocity across the aortic valve was reportedly 4.23 m/s with corresponding peak and mean transvalvular gradients of 72 and 48 mm mercury respectively. Left ventricular outflow tract diameter was reportedly 2 cm.  Left and right heart catheterization performed 02/12/2011 is reviewed. This reveals essentially normal coronary artery anatomy with no significant flow limiting coronary artery disease. There is right dominant coronary circulation. Right heart pressures are normal.  Specifically, PA pressures measured 30/10 with pulmonary Her wedge pressure 7 central venous pressure 3. Mean aortic valve gradient was 73 mm mercury respectively corresponding to estimated aortic valve area is between 0.33 and 0.37 cm. Left ventricular function appeared normal.  Pulmonary function tests performed 01/08/2011 is reviewed. FEV1 measured 0.94 L and increased to 1.17 L following bronchodilator therapy. This corresponds to baseline 48% predicted. FEF 25-75 was 0.34 L were 23% predicted and this increased to 0.65 L or 45% predicted diffusion capacity was estimated 42% predicted. There was evidence for hyperinflation.   Impression:  The patient is 77 years old and reasonably healthy with exception of underlying severe chronic obstructive pulmonary disease. She did manage to quit smoking completely in February 2012. She has now developed worsening symptoms of exertional shortness of breath in the setting of underlying severe aortic stenosis. I agree that she needs aortic valve replacement. Overall I expect that she will do relatively well and the associated risks  of surgery should be acceptably small with her only significant comorbidity being that of her underlying COPD. She does have a reported history of carotid artery stenosis in this has not been checked in quite some time. In the absence of significant aortoiliac occlusive disease she might be a reasonable candidate for minimally invasive approach for surgery.   Plan:  I've discussed options at length with the patient and her sister here in the office today. The rationale for elective aortic valve replacement has been discussed and compared in contrast with long-term prognosis with nonsurgical therapy. Variety of surgical alternatives have also been discussed. After appropriate comparison and contrasting between mechanical valve and tissue valve, the patient specifically requests that her valve be replaced using a bioprosthetic tissue valve in effort to avoid the need for long-term anticoagulation. Based upon her age I think this is a reasonable choice. She understands a small associated risk of late structural valve deterioration in failure depending upon her longevity. We also discussed the relative risks and benefits of minimally invasive approach for surgery in contrast this to conventional sternotomy. The patient is interested in possible minimally invasive approach. We will send her for CT angiogram of the chest abdomen and pelvis to rule out the possibility of significant aortoiliac occlusive disease which might increase risks associated with minimally invasive approach for surgery. Because it has already been well over a year since her most recent echocardiogram, we will also get a followup echocardiogram prior to surgery. The patient specifically understands and accepts all associated risks of surgery including but not limited to risk of death, stroke, myocardial infarction, congestive heart failure, respiratory failure, renal failure, pneumonia, bleeding requiring blood transfusion, arrhythmia, heart  block of bradycardia requiring permanent pacemaker, late complications related to bioprosthetic tissue aortic valve replacement. All of her questions been addressed. We plan to see her back in followup next week with tentatively plans to proceed with surgery on October 31.

## 2011-03-11 ENCOUNTER — Ambulatory Visit
Admission: RE | Admit: 2011-03-11 | Discharge: 2011-03-11 | Disposition: A | Discharge status: Home / Self Care | Payer: Medicare Other | Source: Ambulatory Visit | Attending: Thoracic Surgery (Cardiothoracic Vascular Surgery) | Admitting: Thoracic Surgery (Cardiothoracic Vascular Surgery)

## 2011-03-11 DIAGNOSIS — I7409 Other arterial embolism and thrombosis of abdominal aorta: Secondary | ICD-10-CM

## 2011-03-11 MED ORDER — IOHEXOL 350 MG/ML SOLN
100.0000 mL | Freq: Once | INTRAVENOUS | Status: AC | PRN
  Administered 2011-03-11: 100 mL via INTRAVENOUS

## 2011-03-13 ENCOUNTER — Encounter: Payer: Self-pay | Admitting: Pulmonary Disease

## 2011-03-16 ENCOUNTER — Ambulatory Visit (HOSPITAL_COMMUNITY)
Admission: RE | Admit: 2011-03-16 | Discharge: 2011-03-16 | Disposition: A | Discharge status: Home / Self Care | Payer: Medicare Other | Source: Ambulatory Visit | Attending: Thoracic Surgery (Cardiothoracic Vascular Surgery) | Admitting: Thoracic Surgery (Cardiothoracic Vascular Surgery)

## 2011-03-16 ENCOUNTER — Other Ambulatory Visit: Payer: Self-pay | Admitting: Thoracic Surgery (Cardiothoracic Vascular Surgery)

## 2011-03-16 ENCOUNTER — Encounter (HOSPITAL_COMMUNITY)
Admission: RE | Admit: 2011-03-16 | Discharge: 2011-03-16 | Disposition: A | Discharge status: Home / Self Care | Payer: Medicare Other | Source: Ambulatory Visit | Attending: Thoracic Surgery (Cardiothoracic Vascular Surgery) | Admitting: Thoracic Surgery (Cardiothoracic Vascular Surgery)

## 2011-03-16 ENCOUNTER — Ambulatory Visit (INDEPENDENT_AMBULATORY_CARE_PROVIDER_SITE_OTHER): Payer: Medicare Other | Admitting: Thoracic Surgery (Cardiothoracic Vascular Surgery)

## 2011-03-16 ENCOUNTER — Encounter: Payer: Self-pay | Admitting: Thoracic Surgery (Cardiothoracic Vascular Surgery)

## 2011-03-16 VITALS — BP 129/67 | HR 72 | Resp 16 | Ht 64.0 in | Wt 125.0 lb

## 2011-03-16 DIAGNOSIS — I359 Nonrheumatic aortic valve disorder, unspecified: Secondary | ICD-10-CM | POA: Insufficient documentation

## 2011-03-16 DIAGNOSIS — Z01812 Encounter for preprocedural laboratory examination: Secondary | ICD-10-CM | POA: Insufficient documentation

## 2011-03-16 DIAGNOSIS — I350 Nonrheumatic aortic (valve) stenosis: Secondary | ICD-10-CM

## 2011-03-16 DIAGNOSIS — Z01810 Encounter for preprocedural cardiovascular examination: Secondary | ICD-10-CM

## 2011-03-16 DIAGNOSIS — I6529 Occlusion and stenosis of unspecified carotid artery: Secondary | ICD-10-CM | POA: Insufficient documentation

## 2011-03-16 DIAGNOSIS — I10 Essential (primary) hypertension: Secondary | ICD-10-CM | POA: Insufficient documentation

## 2011-03-16 LAB — URINALYSIS, ROUTINE W REFLEX MICROSCOPIC
Glucose, UA: NEGATIVE mg/dL
Hgb urine dipstick: NEGATIVE
Ketones, ur: NEGATIVE mg/dL
Protein, ur: NEGATIVE mg/dL

## 2011-03-16 LAB — COMPREHENSIVE METABOLIC PANEL
AST: 19 U/L (ref 0–37)
Albumin: 3.5 g/dL (ref 3.5–5.2)
Alkaline Phosphatase: 58 U/L (ref 39–117)
Chloride: 104 mEq/L (ref 96–112)
Potassium: 4.4 mEq/L (ref 3.5–5.1)
Total Bilirubin: 0.3 mg/dL (ref 0.3–1.2)

## 2011-03-16 LAB — BLOOD GAS, ARTERIAL
Acid-Base Excess: 0.9 mmol/L (ref 0.0–2.0)
Bicarbonate: 25 mEq/L — ABNORMAL HIGH (ref 20.0–24.0)
Patient temperature: 98.6
TCO2: 26.2 mmol/L (ref 0–100)
pH, Arterial: 7.411 — ABNORMAL HIGH (ref 7.350–7.400)

## 2011-03-16 LAB — ABO/RH: ABO/RH(D): AB POS

## 2011-03-16 LAB — SURGICAL PCR SCREEN: MRSA, PCR: NEGATIVE

## 2011-03-16 LAB — CBC
HCT: 42.6 % (ref 36.0–46.0)
Hemoglobin: 14.6 g/dL (ref 12.0–15.0)
MCHC: 34.3 g/dL (ref 30.0–36.0)

## 2011-03-16 LAB — PROTIME-INR: INR: 0.94 (ref 0.00–1.49)

## 2011-03-16 LAB — APTT: aPTT: 27 seconds (ref 24–37)

## 2011-03-16 NOTE — Progress Notes (Signed)
Patient returns to the office today with tentatively plans to proceed with elective aortic valve replacement later this week. She was originally seen in consultation last week and a full consultation note and history and physical exam were dictated at that time. Since then she has remained clinically stable. She did undergo CT angiogram of the chest abdomen and pelvis on 03/11/2011. This did reveal fairly extensive atherosclerotic disease in the descending abdominal aorta and iliac vessels. There were no height grade flow limiting lesions, but there was circumferential calcification as well as ostial stenosis of the right common iliac and left common iliac arteries. The thoracic aorta was relatively free of significant disease although there was some calcification in the aortic arch. We discussed these findings and spent in excess of 20 minutes reviewing the indications, risks, and potential benefits of surgery in the office today. Because of the aortoiliac occlusive disease I do not favor the mini thoracotomy approach with femoral cannulation for surgery. Rather, I think it would be best to proceed with either partial upper sternotomy or a full conventional sternotomy. The patient and her sister understand and desire to proceed with surgery on Wednesday as previously scheduled. We will replace her aortic valve using a bioprosthetic tissue valve. The patient understands and accepts all potential associated risks of surgery including but not limited to risk of death, stroke, myocardial infarction, congestive heart failure, respiratory failure, renal failure, pneumonia, bleeding requiring blood transfusion, arrhythmia, heart block or bradycardia requiring permanent pacemaker, possible late complications related to valve replacement. All of her questions been addressed.

## 2011-03-18 ENCOUNTER — Other Ambulatory Visit: Payer: Self-pay | Admitting: Thoracic Surgery (Cardiothoracic Vascular Surgery)

## 2011-03-18 ENCOUNTER — Inpatient Hospital Stay (HOSPITAL_COMMUNITY): Payer: Medicare Other

## 2011-03-18 ENCOUNTER — Inpatient Hospital Stay (HOSPITAL_COMMUNITY)
Admission: RE | Admit: 2011-03-18 | Discharge: 2011-03-24 | DRG: 220 | Disposition: A | Discharge status: Home / Self Care | Payer: Medicare Other | Source: Ambulatory Visit | Attending: Thoracic Surgery (Cardiothoracic Vascular Surgery) | Admitting: Thoracic Surgery (Cardiothoracic Vascular Surgery)

## 2011-03-18 DIAGNOSIS — Z954 Presence of other heart-valve replacement: Secondary | ICD-10-CM

## 2011-03-18 DIAGNOSIS — I2510 Atherosclerotic heart disease of native coronary artery without angina pectoris: Secondary | ICD-10-CM | POA: Diagnosis present

## 2011-03-18 DIAGNOSIS — I359 Nonrheumatic aortic valve disorder, unspecified: Secondary | ICD-10-CM

## 2011-03-18 DIAGNOSIS — D62 Acute posthemorrhagic anemia: Secondary | ICD-10-CM | POA: Diagnosis not present

## 2011-03-18 DIAGNOSIS — Z951 Presence of aortocoronary bypass graft: Secondary | ICD-10-CM

## 2011-03-18 DIAGNOSIS — D72829 Elevated white blood cell count, unspecified: Secondary | ICD-10-CM | POA: Diagnosis present

## 2011-03-18 DIAGNOSIS — G2581 Restless legs syndrome: Secondary | ICD-10-CM | POA: Diagnosis present

## 2011-03-18 DIAGNOSIS — D696 Thrombocytopenia, unspecified: Secondary | ICD-10-CM

## 2011-03-18 DIAGNOSIS — J4489 Other specified chronic obstructive pulmonary disease: Secondary | ICD-10-CM | POA: Diagnosis present

## 2011-03-18 DIAGNOSIS — E039 Hypothyroidism, unspecified: Secondary | ICD-10-CM | POA: Diagnosis present

## 2011-03-18 DIAGNOSIS — Z87891 Personal history of nicotine dependence: Secondary | ICD-10-CM

## 2011-03-18 DIAGNOSIS — J449 Chronic obstructive pulmonary disease, unspecified: Secondary | ICD-10-CM | POA: Diagnosis present

## 2011-03-18 DIAGNOSIS — I4891 Unspecified atrial fibrillation: Secondary | ICD-10-CM | POA: Diagnosis present

## 2011-03-18 HISTORY — PX: AORTIC ROOT REPLACEMENT: SHX1178

## 2011-03-18 HISTORY — DX: Presence of other heart-valve replacement: Z95.4

## 2011-03-18 LAB — POCT I-STAT 3, ART BLOOD GAS (G3+)
Acid-Base Excess: 1 mmol/L (ref 0.0–2.0)
O2 Saturation: 100 %
O2 Saturation: 99 %
Patient temperature: 36.3
TCO2: 31 mmol/L (ref 0–100)
pCO2 arterial: 46.7 mmHg — ABNORMAL HIGH (ref 35.0–45.0)
pH, Arterial: 7.41 — ABNORMAL HIGH (ref 7.350–7.400)
pO2, Arterial: 346 mmHg — ABNORMAL HIGH (ref 80.0–100.0)
pO2, Arterial: 162 mmHg — ABNORMAL HIGH (ref 80.0–100.0)

## 2011-03-18 LAB — POCT I-STAT, CHEM 8
BUN: 18 mg/dL (ref 6–23)
Calcium, Ion: 1.15 mmol/L (ref 1.12–1.32)
Chloride: 104 mEq/L (ref 96–112)
Creatinine, Ser: 1 mg/dL (ref 0.50–1.10)
TCO2: 24 mmol/L (ref 0–100)

## 2011-03-18 LAB — POCT I-STAT 4, (NA,K, GLUC, HGB,HCT)
Glucose, Bld: 67 mg/dL — ABNORMAL LOW (ref 70–99)
Glucose, Bld: 89 mg/dL (ref 70–99)
HCT: 40 % (ref 36.0–46.0)
HCT: 23 % — ABNORMAL LOW (ref 36.0–46.0)
HCT: 24 % — ABNORMAL LOW (ref 36.0–46.0)
HCT: 26 % — ABNORMAL LOW (ref 36.0–46.0)
Hemoglobin: 13.6 g/dL (ref 12.0–15.0)
Hemoglobin: 7.8 g/dL — ABNORMAL LOW (ref 12.0–15.0)
Hemoglobin: 7.5 g/dL — ABNORMAL LOW (ref 12.0–15.0)
Hemoglobin: 7.8 g/dL — ABNORMAL LOW (ref 12.0–15.0)
Hemoglobin: 8.8 g/dL — ABNORMAL LOW (ref 12.0–15.0)
Potassium: 4 mEq/L (ref 3.5–5.1)
Potassium: 3.4 mEq/L — ABNORMAL LOW (ref 3.5–5.1)
Potassium: 4.3 mEq/L (ref 3.5–5.1)
Potassium: 4.2 mEq/L (ref 3.5–5.1)
Potassium: 3.9 mEq/L (ref 3.5–5.1)
Sodium: 141 mEq/L (ref 135–145)
Sodium: 138 mEq/L (ref 135–145)
Sodium: 139 mEq/L (ref 135–145)
Sodium: 142 mEq/L (ref 135–145)

## 2011-03-18 LAB — CREATININE, SERUM
Creatinine, Ser: 0.72 mg/dL (ref 0.50–1.10)
GFR calc Af Amer: >90 mL/min (ref >90)
GFR calc non Af Amer: 80 mL/min — ABNORMAL LOW (ref >90)

## 2011-03-18 LAB — APTT: aPTT: 38 seconds — ABNORMAL HIGH (ref 24–37)

## 2011-03-18 LAB — CBC
HCT: 27.4 % — ABNORMAL LOW (ref 36.0–46.0)
HCT: 28.7 % — ABNORMAL LOW (ref 36.0–46.0)
MCH: 31.8 pg (ref 26.0–34.0)
MCHC: 33.6 g/dL (ref 30.0–36.0)
MCV: 95 fL (ref 78.0–100.0)
Platelets: 126 10*3/uL — ABNORMAL LOW (ref 150–400)
Platelets: 141 10*3/uL — ABNORMAL LOW (ref 150–400)
RDW: 13 % (ref 11.5–15.5)
RDW: 13.1 % (ref 11.5–15.5)
WBC: 11.9 10*3/uL — ABNORMAL HIGH (ref 4.0–10.5)
WBC: 13 10*3/uL — ABNORMAL HIGH (ref 4.0–10.5)

## 2011-03-18 LAB — PROTIME-INR
INR: 1.38 (ref 0.00–1.49)
Prothrombin Time: 17.2 seconds — ABNORMAL HIGH (ref 11.6–15.2)

## 2011-03-18 LAB — HEMOGLOBIN AND HEMATOCRIT, BLOOD: HCT: 21.8 % — ABNORMAL LOW (ref 36.0–46.0)

## 2011-03-18 LAB — PLATELET COUNT: Platelets: 74 10*3/uL — ABNORMAL LOW (ref 150–400)

## 2011-03-18 LAB — MAGNESIUM: Magnesium: 3.4 mg/dL — ABNORMAL HIGH (ref 1.5–2.5)

## 2011-03-19 ENCOUNTER — Inpatient Hospital Stay (HOSPITAL_COMMUNITY): Payer: Medicare Other

## 2011-03-19 LAB — GLUCOSE, CAPILLARY
Glucose-Capillary: 118 mg/dL — ABNORMAL HIGH (ref 70–99)
Glucose-Capillary: 143 mg/dL — ABNORMAL HIGH (ref 70–99)
Glucose-Capillary: 171 mg/dL — ABNORMAL HIGH (ref 70–99)
Glucose-Capillary: 42 mg/dL — CL (ref 70–99)
Glucose-Capillary: 84 mg/dL (ref 70–99)

## 2011-03-19 LAB — PREPARE FRESH FROZEN PLASMA: Unit division: 0

## 2011-03-19 LAB — POCT I-STAT 3, ART BLOOD GAS (G3+)
Acid-base deficit: 1 mmol/L (ref 0.0–2.0)
Acid-base deficit: 1 mmol/L (ref 0.0–2.0)
Bicarbonate: 24.6 mEq/L — ABNORMAL HIGH (ref 20.0–24.0)
Bicarbonate: 25 mEq/L — ABNORMAL HIGH (ref 20.0–24.0)
O2 Saturation: 97 %
O2 Saturation: 97 %
O2 Saturation: 99 %
Patient temperature: 37.2
Patient temperature: 37.2
TCO2: 26 mmol/L (ref 0–100)
TCO2: 26 mmol/L (ref 0–100)
TCO2: 27 mmol/L (ref 0–100)
pCO2 arterial: 43.7 mmHg (ref 35.0–45.0)
pH, Arterial: 7.295 — ABNORMAL LOW (ref 7.350–7.400)
pO2, Arterial: 101 mmHg — ABNORMAL HIGH (ref 80.0–100.0)

## 2011-03-19 LAB — CBC
HCT: 29.1 % — ABNORMAL LOW (ref 36.0–46.0)
Hemoglobin: 9.8 g/dL — ABNORMAL LOW (ref 12.0–15.0)
MCH: 32.6 pg (ref 26.0–34.0)
MCH: 32.2 pg (ref 26.0–34.0)
MCHC: 33.7 g/dL (ref 30.0–36.0)
MCHC: 33.3 g/dL (ref 30.0–36.0)
MCV: 96.5 fL (ref 78.0–100.0)
Platelets: 117 10*3/uL — ABNORMAL LOW (ref 150–400)
RDW: 13.3 % (ref 11.5–15.5)
RDW: 13.5 % (ref 11.5–15.5)

## 2011-03-19 LAB — BASIC METABOLIC PANEL
CO2: 22 mEq/L (ref 19–32)
CO2: 26 mEq/L (ref 19–32)
Calcium: 7.8 mg/dL — ABNORMAL LOW (ref 8.4–10.5)
Calcium: 7.8 mg/dL — ABNORMAL LOW (ref 8.4–10.5)
Creatinine, Ser: 0.81 mg/dL (ref 0.50–1.10)
Creatinine, Ser: 1.07 mg/dL (ref 0.50–1.10)
GFR calc Af Amer: 56 mL/min — ABNORMAL LOW (ref >90)
GFR calc non Af Amer: 68 mL/min — ABNORMAL LOW (ref >90)
GFR calc non Af Amer: 48 mL/min — ABNORMAL LOW (ref >90)
Glucose, Bld: 123 mg/dL — ABNORMAL HIGH (ref 70–99)

## 2011-03-19 LAB — PREPARE PLATELET PHERESIS: Unit division: 0

## 2011-03-19 LAB — POCT I-STAT GLUCOSE
Glucose, Bld: 111 mg/dL — ABNORMAL HIGH (ref 70–99)
Operator id: 156951

## 2011-03-19 LAB — MAGNESIUM: Magnesium: 2.3 mg/dL (ref 1.5–2.5)

## 2011-03-19 NOTE — Op Note (Signed)
NAMETALAYLA, DOYEL                 ACCOUNT NO.:  [id number]  MEDICAL RECORD NO.:  [id number]  LOCATION:  2316                         FACILITY:  [facility]  PHYSICIAN:  Ala Dach, M.D.DATE OF BIRTH:  1930/03/16  DATE OF PROCEDURE:  03/18/2011 DATE OF DISCHARGE:                              OPERATIVE REPORT   INDICATION FOR PROCEDURE:  Ms. Lennartz arrived to the holding area in the morning of surgery where under local anesthesia a  pulmonary artery catheter and  an arterial catheters have been placed.  We have been asked by Dr. Darylene Price, her attending surgeon, to place a TEE probe for evaluation of cardiac structures, both pre and post valvular replacement.  After routine induction of general anesthesia, the TEE probe was prepared and passed oropharyngeally  into the stomach and slightly withdrawn for imaging of the cardiac structures.  Precardiopulmonary bypass TEE exam: Left ventricle:  Left ventricular chamber is seen initially in the short axis view.  There is mild left ventricular hypertrophy appreciated. There is good to excellent overall contractile pattern appreciated with ejection fraction estimated to be greater than 50%.  Papillary muscles are well outlined and intact and there are no masses noted within.  The long-axis and other views are also obtained of the left ventricular chamber.  Mitral Valve:  A four-chamber view was taken of the heart, which revealed the 2 leaflets of the mitral valve.  The anterior leaflet is thickened and slightly degenerative in appearance as is the posteriorly leaflet.Both leaflets appeared to open satisfactorily during diastolic inflow and  during closure they coapt just below the level of the annulus.  There is some very mild ballooning noted in the redundant anterior leaflet area.  Color Doppler examination reveals only trivial mitral regurgitant flow.  Left Atrium:  It is a normal left atrial chamber.  The appendage  is visualized and it is clear.  The interatrial septum is interrogated and is intact.  Aortic Valve:  The aortic valve is seen initially in a short axis view. There are 3 cusps.  These cusps are remarkable for heavy calcium deposition on all areas with some fusion between cusp edges noted. Particularly there is almost a fishmouth opening of the valve itself, but this is primarily due to the severe restriction of motion and opening of this aortic valve.  Multiple 2-D images are obtained.  3D images also obtained which also reveals restricted motion of the aortic valve.  The attempt for a transgastric view and calculation of pressures was not successful, therefore, we could not calculate gradient through and across the aortic valve.  Right ventricle, right atrium, tricuspid valve are normal structures.  The patient is  placed on cardiopulmonary bypass and pericardial tissue valve was attempted to be placed, but turned out to be too large and therefore a stentless aortic valve was then put in place. Subsequently,  De-airing maneuvers were carried out.  The patient was separated from cardiopulmonary bypass with the initial attempt.  Post cardiopulmonary bypass TEE examination:  Aortic Valve: In place of the diseased aortic valve can now be  seen the homograft area with the valve leaflet edges seen easily and well in  the short axis view.  This appears to be seated well and functioning in entirely appropriate manner.  Multiple images are obtained as well as  color Doppler which again reveals no retractions and no aortic insufficiency and no obstruction to flow during systolic ejection.  This is a satisfactory repair.  Left Ventricle:  The left ventricular chamber is seen in the short-axis view on the early bypass period.  It is dynamic, contractile.  There is some flattening of the septum due to somewhat enlarged RV chamber in the early post bypass period, but with time separation of  cardiopulmonary bypass this too improved.  Ultimately, the patient is returned to the Cardiac Intensive Care Unit in stable condition.          ______________________________ Ala Dach, M.D.     JTM/MEDQ  D:  03/18/2011  T:  03/19/2011  Job:  [id number]  Electronically Signed by Greta Doom M.D. on 03/19/2011 08:42:44 PM

## 2011-03-19 NOTE — Op Note (Signed)
[name], [name]                 ACCOUNT NO.:  [id number]  MEDICAL RECORD NO.:  [id number]  LOCATION:  2316                         FACILITY:  [facility]  PHYSICIAN:  [name]. Roxy Manns, M.D. DATE OF BIRTH:  [birth year]  DATE OF PROCEDURE:  03/18/2011 DATE OF DISCHARGE:                              OPERATIVE REPORT   PREOPERATIVE DIAGNOSIS:  Severe aortic stenosis.  POSTOPERATIVE DIAGNOSIS:  Severe aortic stenosis.  PROCEDURE:  Aortic root replacement using Medtronic Freestyle aortic root porcine bioprosthetic valve with coronary artery reimplantation.  SURGEON:  [name]. Roxy Manns, MD  ASSISTANT:  Mr. Raynelle Bring, CSFA  ANESTHESIOLOGIST:  Ala Dach, MD  BRIEF CLINICAL NOTE:  The patient is a 77 year old widowed white female with known history of aortic stenosis, hypertension, COPD, and hypothyroidism.  The patient presents with worsening symptoms of exertional shortness of breath.  Echocardiogram demonstrates severe aortic stenosis with peak and mean transvalvular gradients estimated 72 and 48 mmHg respectively.  Aortic valve area is estimated 0.33 cm2. Cardiac catheterization demonstrates normal coronary artery anatomy with no significant coronary artery disease.  Catheterization also confirmed the presence of severe aortic stenosis with a mean transvalvular gradient of 73 mmHg.  Full consultation note has been dictated previously.  The patient and her family have been counseled regarding the indications, risks, and potential benefits of surgery.  Alternative treatment strategies have been discussed.  Expectations for surgical recovery have been reviewed.  The patient provides full informed consent for the procedure as described and specifically understands and accepts all potential associated risks including but not limited to risk of death, stroke, myocardial infarction, congestive heart failure, respiratory failure, renal failure, bleeding requiring  blood transfusion, arrhythmia, heart block, or bradycardia requiring permanent pacemaker, late complications related to valve replacement.  The patient specifically requests that her valve be replaced using a bioprosthetic tissue valve.  She understands there is a small but significant risk of late structural valve deterioration and failure depending upon longevity.  All of her questions have been addressed.  OPERATIVE FINDINGS: 1. Severe calcific aortic stenosis. 2. Small calcified aortic root, too small for implantation of 21 mm     stented bioprosthetic tissue valve 3. Normal left ventricular systolic function.  OPERATIVE PROCEDURE IN DETAIL:  The patient was brought to the operating room on the above-mentioned date and central monitoring was established by the anesthesia team under the care and direction of Dr. Rica Koyanagi.  Specifically, a Swan-Ganz catheter was placed through the left internal jugular approach.  A radial arterial line was placed. Intravenous antibiotics were administered.  The patient was placed in the supine position on the operating table.  General endotracheal anesthesia was induced uneventfully.  A Foley catheter was placed. Baseline transesophageal echocardiogram was performed by Dr. Orene Desanctis. This confirmed the presence of severe aortic stenosis.  There was normal left ventricular function.  There was trivial mitral regurgitation.  No other significant abnormalities were noted.  The patient's chest, abdomen, both groins, and both lower extremities were prepared and draped in a sterile manner.  A miniature partial upper sternotomy incision was performed.  Initially the sternum was divided partway down and over into the third intercostal space.  The pericardium was opened.  Exposure was felt to be suboptimal due to downward displacement of the patient's heart due to significant chronic obstructive pulmonary disease.  The sternotomy was therefore  completed to a full sternotomy.  The patient was heparinized systemically.  The ascending aorta and the right atrium were cannulated for cardiopulmonary bypass.  A retrograde cardioplegic cannula was placed through the right atrium into the coronary sinus.  Cardiopulmonary bypass was begun.  The left ventricular vent was placed through the right superior pulmonary vein.  Cardioplegic cannula was placed low in the proximal ascending aorta.  The patient was allowed to cool passively to 28 degrees systemic temperature.  The aortic crossclamp was applied and cold blood cardioplegia was delivered initially in antegrade fashion through the aortic root.  Iced saline slush was applied for topical hypothermia and supplemental cardioplegia was administered retrograde through the coronary sinus catheter.  The initial cardioplegic arrest was rapid with early diastolic arrest.  Repeat doses of cardioplegia were administered intermittently throughout the entire crossclamp portion of the operation every 20 minutes to maintain completely flat electrocardiogram.  The mediastinal field was flooded with carbon dioxide continuously.  An oblique aortotomy incision was performed.  The aortic valve was inspected.  The aortic valve was severely stenotic and heavily calcified.  The aortic valve was tricuspid.  The left main coronary artery was large and in normal anatomical position.  The right coronary artery was quite small and located close to the aortic anulus near the commissure between the right and noncoronary sinus of Valsalva.  The aortic valve leaflets were excised sharply.  The aortic anulus was decalcified.  This was somewhat tedious due to the amount of calcification.  The aortic anulus and root were irrigated with copious saline solution.  The aortic anulus was sized and felt potentially to accept a 21 mm stented bioprosthetic tissue valve.  However, the sinotubular junction is notably smaller  than this.  The aortotomy was extended further down into the noncoronary sinus of Valsalva to allow a 21 mm sizer to pass down into the aortic root.  Aortic valve replacement was performed using interrupted 2-0 Ethibond horizontal mattress pledgeted sutures with pledgets in the subannular position.  An [facility] Ease pericardial tissue valve (model #[id number], size 21 mm, serial [id number]) was lowered down into the aortic root.  However, the valve will not seat completely without partial obstruction of both coronary arteries due to flattening of aortic anulus.  It is clear that the valve is oversized and aortic root will not accept a 21 mm valve.  Furthermore, the degree of calcification in the aortic root was such that direct root enlargement could not be safely completed because of severe calcification throughout the aortic root and sinuses of Valsalva.  The Edwards valve was removed and plan was made for aortic root replacement.  The left main and the right coronary arteries were mobilized on 2 separate coronary buttons and the remainder of the sinuses of Valsalva were excised sharply.  The aortic root was sized to accept a 21 21 mm Freestyle root.  Aortic root replacement was performed using a Medtronic Freestyle porcine aortic root heart valve (size 21 mm, serial #[id number]).  The proximal suture line was constructed with interrupted simple 4-0 Ethibond sutures.  The left main and the right coronary buttons were then reimplanted onto their associated sinuses of Valsalva after trimming circular holes out of the wall of the porcine root.  The distal end of the root was then  sewn end-to-end into the distal ascending aorta with running 4-0 Prolene suture.  One final dose of warm retrograde hot shot cardioplegia was administered.  A Magoon needle was placed into the aortic root to serve as a vent.  The aortic crossclamp was removed after a total crossclamp time of 163  minutes.  The heart began to beat spontaneously without need for cardioversion. The aortotomy incision was carefully inspected for hemostasis.  The retrograde cardioplegic cannula was removed.  Epicardial pacing wires were fixed to the right ventricular outflow tract into the right atrial appendage.  The patient was rewarmed to 37 degrees centigrade temperature.  Low-dose dopamine infusion was begun.  The patient was weaned from cardiopulmonary bypass without difficulty.  The patient's rhythm at separation from bypass was AV paced.  Total cardiopulmonary bypass time for the operation was 205 minutes.  Followup transesophageal echocardiogram performed by Dr. Orene Desanctis after separation from bypass demonstrates normal left ventricular function. There was a normal functioning bioprosthetic aortic valve.  There was mild-to-moderate right ventricular dysfunction, which improves rapidly after separation from bypass.  No other abnormalities were noted. The aortic and venous cannulae were removed uneventfully.  Protamine was administered to reverse the anticoagulation.  Meticulous surgical hemostasis was ascertained.  The patient was transfused 2 packs of adult platelets and 2 units fresh frozen plasma for coagulopathy with thrombocytopenia.  The mediastinum was drained with 2 chest tubes placed through separate stab incisions inferiorly.  The pericardium and soft tissues anterior to the aorta were reapproximated loosely.  The sternum was closed with double strength sternal wire.  The soft tissues anterior to the sternum were closed in multiple layers and the skin was closed with a running subcuticular skin closure.  The patient tolerated the procedure well and was transported to the Surgical Intensive Care Unit in stable condition.  There were no intraoperative complications.  All sponge, instrument, and needle counts were verified correct at completion of the operation.     [name].  Roxy Manns, M.D.     CHO/MEDQ  D:  03/18/2011  T:  03/19/2011  Job:  [id number]  cc:   Belva Crome, M.D. Nehemiah Settle, M.D.  Electronically Signed by Darylene Price M.D. on 03/19/2011 05:19:05 PM

## 2011-03-20 ENCOUNTER — Inpatient Hospital Stay (HOSPITAL_COMMUNITY): Payer: Medicare Other

## 2011-03-20 LAB — GLUCOSE, CAPILLARY
Glucose-Capillary: 219 mg/dL — ABNORMAL HIGH (ref 70–99)
Glucose-Capillary: 181 mg/dL — ABNORMAL HIGH (ref 70–99)

## 2011-03-20 LAB — BASIC METABOLIC PANEL
BUN: 20 mg/dL (ref 6–23)
Chloride: 104 mEq/L (ref 96–112)
Creatinine, Ser: 0.99 mg/dL (ref 0.50–1.10)
Glucose, Bld: 140 mg/dL — ABNORMAL HIGH (ref 70–99)
Potassium: 3.5 mEq/L (ref 3.5–5.1)

## 2011-03-20 LAB — CBC
HCT: 25.4 % — ABNORMAL LOW (ref 36.0–46.0)
Hemoglobin: 8.5 g/dL — ABNORMAL LOW (ref 12.0–15.0)
MCHC: 33.5 g/dL (ref 30.0–36.0)
MCV: 95.8 fL (ref 78.0–100.0)
WBC: 11.1 10*3/uL — ABNORMAL HIGH (ref 4.0–10.5)

## 2011-03-21 ENCOUNTER — Inpatient Hospital Stay (HOSPITAL_COMMUNITY): Payer: Medicare Other

## 2011-03-21 LAB — GLUCOSE, CAPILLARY
Glucose-Capillary: 122 mg/dL — ABNORMAL HIGH (ref 70–99)
Glucose-Capillary: 119 mg/dL — ABNORMAL HIGH (ref 70–99)
Glucose-Capillary: 120 mg/dL — ABNORMAL HIGH (ref 70–99)
Glucose-Capillary: 128 mg/dL — ABNORMAL HIGH (ref 70–99)

## 2011-03-21 LAB — CBC
Hemoglobin: 10.5 g/dL — ABNORMAL LOW (ref 12.0–15.0)
Platelets: 127 10*3/uL — ABNORMAL LOW (ref 150–400)
RBC: 3.27 MIL/uL — ABNORMAL LOW (ref 3.87–5.11)
WBC: 15.2 10*3/uL — ABNORMAL HIGH (ref 4.0–10.5)

## 2011-03-21 LAB — BASIC METABOLIC PANEL
CO2: 30 mEq/L (ref 19–32)
Glucose, Bld: 112 mg/dL — ABNORMAL HIGH (ref 70–99)
Potassium: 4.6 mEq/L (ref 3.5–5.1)
Sodium: 136 mEq/L (ref 135–145)

## 2011-03-21 LAB — URINALYSIS, ROUTINE W REFLEX MICROSCOPIC
Bilirubin Urine: NEGATIVE
Hgb urine dipstick: NEGATIVE
Ketones, ur: NEGATIVE mg/dL
Nitrite: NEGATIVE
pH: 6.5 (ref 5.0–8.0)

## 2011-03-21 MED ORDER — FERROUS SULFATE 325 (65 FE) MG PO TABS
325.0000 mg | ORAL_TABLET | ORAL | Status: DC
  Administered 2011-03-23: 325 mg via ORAL
  Filled 2011-03-21 (×2): qty 1

## 2011-03-21 MED ORDER — PANTOPRAZOLE SODIUM 40 MG PO TBEC
40.0000 mg | DELAYED_RELEASE_TABLET | Freq: Every day | ORAL | Status: DC
  Administered 2011-03-22 – 2011-03-24 (×4): 40 mg via ORAL
  Filled 2011-03-21: qty 1

## 2011-03-21 MED ORDER — SIMVASTATIN 20 MG PO TABS
20.0000 mg | ORAL_TABLET | Freq: Every day | ORAL | Status: DC
  Administered 2011-03-22 – 2011-03-23 (×2): 20 mg via ORAL
  Filled 2011-03-21 (×4): qty 1

## 2011-03-21 MED ORDER — LEVOTHYROXINE SODIUM 125 MCG PO TABS
125.0000 ug | ORAL_TABLET | Freq: Every day | ORAL | Status: DC
  Administered 2011-03-22 – 2011-03-24 (×3): 125 ug via ORAL
  Filled 2011-03-21 (×5): qty 1

## 2011-03-21 MED ORDER — BUDESONIDE-FORMOTEROL FUMARATE 160-4.5 MCG/ACT IN AERO
2.0000 | INHALATION_SPRAY | Freq: Two times a day (BID) | RESPIRATORY_TRACT | Status: DC
  Administered 2011-03-22 – 2011-03-24 (×5): 2 via RESPIRATORY_TRACT
  Filled 2011-03-21: qty 6

## 2011-03-21 MED ORDER — ZOLPIDEM TARTRATE 5 MG PO TABS
5.0000 mg | ORAL_TABLET | Freq: Every evening | ORAL | Status: DC | PRN
  Administered 2011-03-21 – 2011-03-24 (×2): 5 mg via ORAL
  Filled 2011-03-21: qty 1

## 2011-03-21 MED ORDER — TIOTROPIUM BROMIDE MONOHYDRATE 18 MCG IN CAPS
18.0000 ug | ORAL_CAPSULE | Freq: Every day | RESPIRATORY_TRACT | Status: DC
  Administered 2011-03-22 – 2011-03-24 (×3): 18 ug via RESPIRATORY_TRACT
  Filled 2011-03-21 (×2): qty 5

## 2011-03-21 MED ORDER — POTASSIUM CHLORIDE CRYS ER 20 MEQ PO TBCR: 40.0000 meq | EXTENDED_RELEASE_TABLET | Freq: Every day | ORAL | Status: DC

## 2011-03-21 MED ORDER — FUROSEMIDE 40 MG PO TABS
40.0000 mg | ORAL_TABLET | Freq: Every day | ORAL | Status: DC
  Administered 2011-03-22: 40 mg via ORAL
  Filled 2011-03-21 (×3): qty 1

## 2011-03-21 MED ORDER — INSULIN ASPART 100 UNIT/ML ~~LOC~~ SOLN
0.0000 [IU] | Freq: Three times a day (TID) | SUBCUTANEOUS | Status: DC
  Administered 2011-03-22 – 2011-03-23 (×2): 2 [IU] via SUBCUTANEOUS

## 2011-03-21 MED ORDER — ACETAMINOPHEN 500 MG PO TABS
1000.0000 mg | ORAL_TABLET | Freq: Four times a day (QID) | ORAL | Status: AC
  Administered 2011-03-21 – 2011-03-24 (×9): 1000 mg via ORAL
  Filled 2011-03-21 (×4): qty 2

## 2011-03-21 MED ORDER — ONDANSETRON HCL 4 MG PO TABS: 4.0000 mg | ORAL_TABLET | Freq: Four times a day (QID) | ORAL | Status: DC | PRN

## 2011-03-21 MED ORDER — POTASSIUM CHLORIDE CRYS ER 20 MEQ PO TBCR
20.0000 meq | EXTENDED_RELEASE_TABLET | Freq: Every day | ORAL | Status: DC
  Administered 2011-03-22: 20 meq via ORAL
  Filled 2011-03-21: qty 1

## 2011-03-21 MED ORDER — ASPIRIN EC 325 MG PO TBEC
325.0000 mg | DELAYED_RELEASE_TABLET | Freq: Every day | ORAL | Status: DC
  Administered 2011-03-22 – 2011-03-24 (×3): 325 mg via ORAL
  Filled 2011-03-21 (×4): qty 1

## 2011-03-21 MED ORDER — TRAMADOL HCL 50 MG PO TABS: 50.0000 mg | ORAL_TABLET | ORAL | Status: DC | PRN

## 2011-03-21 MED ORDER — ACETAMINOPHEN 325 MG PO TABS
650.0000 mg | ORAL_TABLET | Freq: Four times a day (QID) | ORAL | Status: DC | PRN
  Administered 2011-03-23: 650 mg via ORAL
  Filled 2011-03-21: qty 2

## 2011-03-21 MED ORDER — ALUM & MAG HYDROXIDE-SIMETH 200-200-20 MG/5ML PO SUSP: 15.0000 mL | ORAL | Status: DC | PRN

## 2011-03-21 MED ORDER — SODIUM CHLORIDE 0.9 % IJ SOLN
3.0000 mL | Freq: Two times a day (BID) | INTRAMUSCULAR | Status: DC
  Administered 2011-03-21 – 2011-03-23 (×4): 3 mL via INTRAVENOUS

## 2011-03-21 MED ORDER — METOPROLOL TARTRATE 25 MG PO TABS
12.5000 mg | ORAL_TABLET | Freq: Two times a day (BID) | ORAL | Status: DC
  Filled 2011-03-21 (×4): qty 0.5

## 2011-03-21 MED ORDER — OXYCODONE HCL 5 MG PO TABS
5.0000 mg | ORAL_TABLET | ORAL | Status: DC | PRN
  Administered 2011-03-22 (×2): 10 mg via ORAL
  Filled 2011-03-21 (×2): qty 2

## 2011-03-21 MED ORDER — DOCUSATE SODIUM 100 MG PO CAPS: 200.0000 mg | ORAL_CAPSULE | Freq: Every day | ORAL | Status: DC

## 2011-03-21 MED ORDER — BISACODYL 5 MG PO TBEC: 10.0000 mg | DELAYED_RELEASE_TABLET | Freq: Every day | ORAL | Status: DC

## 2011-03-21 MED ORDER — SODIUM CHLORIDE 0.9 % IJ SOLN
3.0000 mL | Freq: Two times a day (BID) | INTRAMUSCULAR | Status: DC
  Administered 2011-03-21 – 2011-03-24 (×4): 3 mL via INTRAVENOUS

## 2011-03-21 MED ORDER — LORATADINE 10 MG PO TABS
10.0000 mg | ORAL_TABLET | Freq: Every day | ORAL | Status: DC
  Administered 2011-03-22 – 2011-03-24 (×3): 10 mg via ORAL
  Filled 2011-03-21 (×4): qty 1

## 2011-03-21 MED ORDER — MUPIROCIN 2 % EX OINT
TOPICAL_OINTMENT | Freq: Two times a day (BID) | CUTANEOUS | Status: DC
  Administered 2011-03-21 – 2011-03-24 (×6): via NASAL
  Filled 2011-03-21: qty 22

## 2011-03-21 MED ORDER — MAGNESIUM HYDROXIDE 400 MG/5ML PO SUSP: 30.0000 mL | Freq: Every day | ORAL | Status: DC | PRN

## 2011-03-21 MED ORDER — BISACODYL 10 MG RE SUPP: 10.0000 mg | Freq: Every day | RECTAL | Status: DC | PRN

## 2011-03-21 MED ORDER — ONDANSETRON HCL 4 MG/2ML IJ SOLN: 4.0000 mg | Freq: Four times a day (QID) | INTRAMUSCULAR | Status: DC | PRN

## 2011-03-21 MED ORDER — CHLORHEXIDINE GLUCONATE CLOTH 2 % EX PADS
6.0000 | MEDICATED_PAD | Freq: Every day | CUTANEOUS | Status: DC
  Administered 2011-03-23 – 2011-03-24 (×2): 6 via TOPICAL
  Filled 2011-03-21 (×3): qty 6

## 2011-03-21 MED ORDER — THERA M PLUS PO TABS
1.0000 | ORAL_TABLET | Freq: Every day | ORAL | Status: DC
  Administered 2011-03-22 – 2011-03-24 (×3): 1 via ORAL
  Filled 2011-03-21 (×5): qty 1

## 2011-03-21 MED ORDER — INSULIN ASPART 100 UNIT/ML ~~LOC~~ SOLN: 0.0000 [IU] | Freq: Three times a day (TID) | SUBCUTANEOUS | Status: DC

## 2011-03-22 ENCOUNTER — Other Ambulatory Visit: Payer: Self-pay

## 2011-03-22 DIAGNOSIS — I4891 Unspecified atrial fibrillation: Secondary | ICD-10-CM | POA: Diagnosis not present

## 2011-03-22 HISTORY — DX: Unspecified atrial fibrillation: I48.91

## 2011-03-22 LAB — GLUCOSE, CAPILLARY
Glucose-Capillary: 120 mg/dL — ABNORMAL HIGH (ref 70–99)
Glucose-Capillary: 160 mg/dL — ABNORMAL HIGH (ref 70–99)
Glucose-Capillary: 108 mg/dL — ABNORMAL HIGH (ref 70–99)
Glucose-Capillary: 111 mg/dL — ABNORMAL HIGH (ref 70–99)

## 2011-03-22 LAB — CBC
HCT: 27.8 % — ABNORMAL LOW (ref 36.0–46.0)
Hemoglobin: 9.1 g/dL — ABNORMAL LOW (ref 12.0–15.0)
MCH: 31.9 pg (ref 26.0–34.0)
MCHC: 32.7 g/dL (ref 30.0–36.0)
MCV: 97.5 fL (ref 78.0–100.0)
RBC: 2.85 MIL/uL — ABNORMAL LOW (ref 3.87–5.11)

## 2011-03-22 LAB — BASIC METABOLIC PANEL
CO2: 27 mEq/L (ref 19–32)
Calcium: 9 mg/dL (ref 8.4–10.5)
Creatinine, Ser: 1.01 mg/dL (ref 0.50–1.10)
GFR calc non Af Amer: 52 mL/min — ABNORMAL LOW (ref >90)

## 2011-03-22 MED ORDER — AMIODARONE HCL 200 MG PO TABS
400.0000 mg | ORAL_TABLET | Freq: Two times a day (BID) | ORAL | Status: DC
  Administered 2011-03-22 – 2011-03-24 (×5): 400 mg via ORAL
  Filled 2011-03-22 (×6): qty 2

## 2011-03-22 MED ORDER — AMIODARONE LOAD VIA INFUSION
150.0000 mg | Freq: Once | INTRAVENOUS | Status: DC
  Filled 2011-03-22: qty 151.2

## 2011-03-22 MED ORDER — DEXTROSE 5 % IV SOLN
0.5000 mg/min | INTRAVENOUS | Status: AC
  Filled 2011-03-22: qty 9

## 2011-03-22 MED ORDER — DEXTROSE 5 % IV SOLN
1.0000 mg/min | INTRAVENOUS | Status: AC
  Administered 2011-03-22: 1 mg/min via INTRAVENOUS
  Filled 2011-03-22 (×2): qty 9

## 2011-03-22 NOTE — Progress Notes (Signed)
Amiodarone IV stopped at 1230 with oral Amiodarone 431m po given 1 hr prior to DC the Amio IV. Temporary PM intact. Will continue to monitor.

## 2011-03-22 NOTE — Progress Notes (Addendum)
Subjective: Patient feeling better this am.  She had felt a very fast heart rate earlier this am and had some anxiety about it.  Only other complaint is some loose stools.  Objective: Vital signs in last 24 hours: Patient Vitals for the past 24 hrs:  BP Temp Pulse Resp SpO2 Weight  03/22/11 0630 120/63 mmHg 97.1 F (36.2 C) 64  18  96 % 145 lb (65.772 kg)  03/22/11 0200 113/43 mmHg - 77  18  93 % -  03/22/11 0130 99/63 mmHg - 140  20  93 % -  03/22/11 0055 111/71 mmHg - 160  22  94 % -  03/22/11 0047 117/100 mmHg - 170  22  80 % -      Physical Exam:  Cardiovascular: RRR, no murmurs, gallops, or rubs. Pulmonary: Clear to auscultation at apex; slightly decreased at bases;no rales, wheezes, or rhonchi. Abdomen: Soft, non tender, slight distention, bowel sounds present. Extremities: Trace bilateral lower extremity edema. Wound: Clean and dry.  No erythema or signs of infection.  Lab Results: CBC: Basename 03/22/11 0600 03/21/11 0630  WBC 10.6* 15.2*  HGB 9.1* 10.5*  HCT 27.8* 31.9*  PLT 138* 127*   BMET:  Basename 03/21/11 0630 03/20/11 0424  NA 136 139  K 4.6 3.5  CL 100 104  CO2 30 29  GLUCOSE 112* 140*  BUN 18 20  CREATININE 0.92 0.99  CALCIUM 9.1 7.7*    PT/INR: No results found for this basename: LABPROT,INR in the last 72 hours ABG:  INR: Will add last result for INR, ABG once components are confirmed Will add last 4 CBG results once components are confirmed  Assessment/Plan:  1. CV - Had AFIB with RVR about 1213 am.  Given Amiodarone bolus followed by a GTTP.              Patient converted to SR and HR in 60's this am.  Previously, patient was Apaced                and Lopressor was discontinued.  Discussed with Dr. Radford Pax, Amiodarone GTTP               to be stopped and PO to be started.  External pacemaker set at VVI 56 for back up. 2.  Pulmonary - Patient place on oxygen earlier this am when went into AFIB with RVR.                            Will be  weaned off today.  Continue with incentive spirometer. 3. Volume Overload - Continue with diuresis. 4.  ABL anemia - H/H decreased from 10.5/31.9 to 9.1/27.8.  Continue with Ferrous                                         Sulfate.    5.  Leukocytosis resolved- WBC decreased from 15.2 to 10.6.  UA negative, no signs of                                             Wound infection, no PNA on CR.  Previous leukocytosis likely  secondary to SIRS. 6.  Patient with history of hypothyroidism so will check TSH in am. 7.  Stop colace and dulcolax as patient with loose stools.  _0 @ 03/22/2011

## 2011-03-22 NOTE — Progress Notes (Signed)
Pt had 7 beats of V-tach, asymptomatic. Dr. Radford Pax notified. Will continue to monitor.Pt had 5 beats of V-tach run and the PA Tacy Dura was notified and stated to continue to monitor and notify the Cardiologist if it happens again. Dr. Radford Pax notified.

## 2011-03-22 NOTE — Progress Notes (Addendum)
Subjective:  The patient developed atrial fibrillation with RVR overnight and received IV amiodarone bolus and is now on an Amio gtt.  She subsequently converted to NSR.  She denies any chest pain, SOB.  Objective:  Vital Signs in the last 24 hours: Temp:  [97.1 F (36.2 C)] 97.1 F (36.2 C) (11/04 0630) Pulse Rate:  [64-170] 64  (11/04 0630) Resp:  [18-22] 18  (11/04 0630) BP: (99-120)/(43-100) 120/63 mmHg (11/04 0630) SpO2:  [80 %-96 %] 96 % (11/04 0630) Weight:  [65.772 kg (145 lb)] 145 lb (65.772 kg) (11/04 0630)  Intake/Output from previous day:   Intake/Output from this shift:    Physical Exam: Lungs: clear to auscultation bilaterally Heart: regular rate and rhythm, S1, S2 normal, no murmur, click, rub or gallop Extremities: extremities normal, atraumatic, no cyanosis or edema  Lab Results:  Basename 03/22/11 0600 03/21/11 0630  WBC 10.6* 15.2*  HGB 9.1* 10.5*  PLT 138* 127*    Basename 03/21/11 0630 03/20/11 0424  NA 136 139  K 4.6 3.5  CL 100 104  CO2 30 29  GLUCOSE 112* 140*  BUN 18 20  CREATININE 0.92 0.99   No results found for this basename: TROPONINI:2,CK,MB:2 in the last 72 hours Hepatic Function Panel No results found for this basename: PROT,ALBUMIN,AST,ALT,ALKPHOS,BILITOT,BILIDIR,IBILI in the last 72 hours No results found for this basename: CHOL in the last 72 hours No results found for this basename: PROTIME in the last 72 hours  Imaging:   Cardiac Studies:  Assessment:  Atrial Fibrillation CAD s/p CABG  Plan: Change to PO Amio when ok with CVTS Check BMET   LOS: 4 days    Saniyah Mondesir R 03/22/2011, 9:10 AM

## 2011-03-22 NOTE — Progress Notes (Signed)
Pt HR increased to 170 afib. BP 117/100 80% rma.  Ekg obtained, o2 applied. Called Dr. Roxan Hockey and made him aware of above. Orders given. HR now 140-160 afib, BP now 111/78 94% 4lnc. Starting amiodarone gtt. Will continue to monitor.

## 2011-03-23 DIAGNOSIS — D696 Thrombocytopenia, unspecified: Secondary | ICD-10-CM

## 2011-03-23 LAB — GLUCOSE, CAPILLARY
Glucose-Capillary: 88 mg/dL (ref 70–99)
Glucose-Capillary: 114 mg/dL — ABNORMAL HIGH (ref 70–99)
Glucose-Capillary: 123 mg/dL — ABNORMAL HIGH (ref 70–99)

## 2011-03-23 MED ORDER — AMIODARONE HCL 400 MG PO TABS: ORAL_TABLET | ORAL | Status: DC

## 2011-03-23 MED ORDER — OXYCODONE HCL 5 MG PO TABS: 5.0000 mg | ORAL_TABLET | ORAL | Status: AC | PRN

## 2011-03-23 MED ORDER — ASPIRIN 325 MG PO TBEC: 325.0000 mg | DELAYED_RELEASE_TABLET | Freq: Every day | ORAL | Status: AC

## 2011-03-23 NOTE — Progress Notes (Signed)
Patient s/p AVR on 03/18/11.  PTA, pt independent of ADLS.  Pt states sister Enid Derry to provide 24hr care at dc...son and neighbor to assist as well. Pt progressing well but still on oxygen at 3 liters.  Pt may need home O2 at discharge if unable to wean.  Please contact case manager if needed [phone].  Thanks.

## 2011-03-23 NOTE — Progress Notes (Signed)
Subjective:  Doing well no new complaints. No SOB, no CP, no F. Ambulating well.  Objective:  Vital Signs in the last 24 hours: Temp:  [97.4 F (36.3 C)-98.1 F (36.7 C)] 98.1 F (36.7 C) (11/05 0451) Pulse Rate:  [68-85] 68  (11/05 0451) Resp:  [16-18] 18  (11/05 0451) BP: (127-143)/(47-63) 127/60 mmHg (11/05 0451) SpO2:  [93 %-97 %] 96 % (11/05 0849) FiO2 (%):  [2 %] 2 % (11/04 2046) Weight:  [62.823 kg (138 lb 8 oz)] 138 lb 8 oz (62.823 kg) (11/05 0451)  Intake/Output from previous day: 11/04 0701 - 11/05 0700 In: 480 [P.O.:480] Out: 650 [Urine:650] Intake/Output from this shift:   TELE - SR. Afib was 11/3 night.   Physical Exam: General appearance: alert, cooperative and no distress Lungs: clear to auscultation bilaterally Heart: regular rate and rhythm, no S3 or S4 and systolic murmur: early systolic 1/6, crescendo at 2nd right intercostal space Abdomen: soft, non-tender; bowel sounds normal; no masses,  no organomegaly Extremities: extremities normal, atraumatic, no cyanosis or edema  Lab Results:  Basename 03/22/11 0600 03/21/11 0630  WBC 10.6* 15.2*  HGB 9.1* 10.5*  PLT 138* 127*    Basename 03/22/11 0956 03/21/11 0630  NA 136 136  K 3.7 4.6  CL 100 100  CO2 27 30  GLUCOSE 142* 112*  BUN 18 18  CREATININE 1.01 0.92   No results found for this basename: TROPONINI:2,CK,MB:2 in the last 72 hours Hepatic Function Panel No results found for this basename: PROT,ALBUMIN,AST,ALT,ALKPHOS,BILITOT,BILIDIR,IBILI in the last 72 hours No results found for this basename: CHOL in the last 72 hours No results found for this basename: PROTIME in the last 72 hours  Imaging: Imaging results have been reviewed  Cardiac Studies:  Assessment/Plan:  Aortic stenosis s/p valve replacement. - Doing well.   Atrial fibrillation - paroxsymal. On amiodarone now oral. Continue as outpt with goal of dc in a few weeks.  ASA only now. If further episodes occur, coumadin.    Leukocytosis - per primary team. Improved. 15 to 10.   Will follow. (Dr. Tamala Julian is primary cardiologist).   LOS: 5 days    [name] 03/23/2011, 9:00 AM

## 2011-03-23 NOTE — Progress Notes (Signed)
Pacing wires removed per protocol without difficulty, all ends were intact, patient tolerated procedure well, patient instructed on one hour bedrest, & will continue to monitor

## 2011-03-23 NOTE — Progress Notes (Signed)
Attempted to wean off oxygen; removed oxygen and O2 saturation down to 86% after 5 minutes.  Replaced oxygen _0  and improved to 92%. Encouraged continued use of flutter valve and incentive. Will continue to monitor oxygen needs.

## 2011-03-23 NOTE — Progress Notes (Addendum)
CARDIAC REHAB PHASE I   PRE:  Rate/Rhythm: 75 sr   BP:  Supine:   Sitting: 124/54  Standing:    SaO2: 97 2l  ----> 88 ra  MODE:  Ambulation: 250 ft   POST:  Rate/Rhythem: 84 sr   BP:  Supine:   Sitting: 104/82  Standing:    SaO2: 93 3l   Pt ambulated hall with rn tolerated fair. 02 removed in room pt desat to 88 on ra 02 reapplied 02 sat 94 on 2l prior to walk, pt 02 dropped during walk to 87 on 2l 02 denied sob, 02 increased to 3l remained 93-94 on 3l. Pt complaint of leg fatigue during walk, stated she doesn't walk much at home. Pt return to chair with feet elevated on 2l 02. rn aware [place]  Deatra Ina

## 2011-03-23 NOTE — Progress Notes (Signed)
Pt ambulated 550 ft on 3L of oxygen. Tolerated walk well. Will continue to monitor

## 2011-03-23 NOTE — Progress Notes (Signed)
  Subjective: Patient reports doing well other than soreness in her chest.  Appetite is marginal but improving. Bowels are working. She denies shortness of breath. Mobility is quite good.  Objective: Vital signs in last 24 hours: Temp:  [97.4 F (36.3 C)-98.1 F (36.7 C)] 98.1 F (36.7 C) (11/05 0451) Pulse Rate:  [68-85] 68  (11/05 0451) Cardiac Rhythm:  [-] Normal sinus rhythm (11/05 0820) Resp:  [16-18] 18  (11/05 0451) BP: (127-143)/(47-63) 127/60 mmHg (11/05 0451) SpO2:  [93 %-97 %] 96 % (11/05 0849) FiO2 (%):  [2 %] 2 % (11/04 2046) Weight:  [138 lb 8 oz (62.823 kg)] 138 lb 8 oz (62.823 kg) (11/05 0451)  Hemodynamic parameters for last 24 hours:    Intake/Output from previous day: 11/04 0701 - 11/05 0700 In: 480 [P.O.:480] Out: 650 [Urine:650] Intake/Output this shift:    Physical Exam:  Patient has been maintaining NSR for more than 24 hours.  Her incision is healing nicely.  Breath sounds are clear. Abdomen is soft. Extremities are warm and without any peripheral edema.  Lab Results:  Basename 03/22/11 0600 03/21/11 0630  WBC 10.6* 15.2*  HGB 9.1* 10.5*  HCT 27.8* 31.9*  PLT 138* 127*   BMET:  Basename 03/22/11 0956 03/21/11 0630  NA 136 136  K 3.7 4.6  CL 100 100  CO2 27 30  GLUCOSE 142* 112*  BUN 18 18  CREATININE 1.01 0.92  CALCIUM 9.0 9.1    PT/INR: No results found for this basename: LABPROT,INR in the last 72 hours ABG    Component Value Date/Time   PHART 7.359 03/19/2011 0408   HCO3 24.5* 03/19/2011 0408   TCO2 26 03/19/2011 0408   ACIDBASEDEF 1.0 03/19/2011 0408   O2SAT 99.0 03/19/2011 0408   CBG (last 3)   Basename 03/23/11 0719 03/22/11 2035 03/22/11 1508  GLUCAP 88 111* 114*    Assessment/Plan:  Patient is doing quite well s/p aortic root replacement.  She had a very brief episode of PAF over the weekend but has been maintaining NSR for more than the past 24 hours.  She is still on oxygen this morning, but I doubt that she still  needs it.  She potentially could be ready for d/c home tomorrow.      LOS: 5 days    Jamaul Heist H 03/23/2011

## 2011-03-24 ENCOUNTER — Other Ambulatory Visit: Payer: Self-pay | Admitting: Interventional Cardiology

## 2011-03-24 LAB — TYPE AND SCREEN
ABO/RH(D): AB POS
Antibody Screen: NEGATIVE
Unit division: 0

## 2011-03-24 NOTE — Progress Notes (Signed)
Chest tube sutures removed without difficulty. Steri Strips applied

## 2011-03-24 NOTE — Consult Note (Signed)
Tobacco Cessation- Pt was smoking 1ppd however says she has quit and has been using the E-cigarette since February and her MD's recommended it. Informed pt about using the E-cigarettes and its risks. Pt has a strong oral fixation and so I recommended using the Nicotrol inhaler and referred to MD for a Rx. Pt in action phase and willing to try the Nicotrol inhaler.  Referred to 1-800-quit-now for f/u and support. Discussed oral fixation substitutes, 2nd hand smoke risks and in-home smoking policies. Reviewed and gave pt written education/contact information.

## 2011-03-24 NOTE — Progress Notes (Signed)
Subjective: Patient eating breakfast without complaints.  She hopes to go home today.  Objective: Vital signs in last 24 hours: Patient Vitals for the past 24 hrs:  BP Temp Temp src Pulse Resp SpO2 Weight  03/24/11 0421 125/53 mmHg 98.1 F (36.7 C) Oral 69  18  94 % 136 lb 1.6 oz (61.735 kg)  03/23/11 2135 - - - - - 96 % -  03/23/11 1900 143/46 mmHg 97.8 F (36.6 C) Oral 77  16  95 % -  03/23/11 1500 124/59 mmHg - - 76  16  96 % -  03/23/11 1430 120/50 mmHg 98.3 F (36.8 C) Oral 75  16  93 % -  03/23/11 1402 142/50 mmHg - - 78  - - -  03/23/11 1359 152/55 mmHg - - 78  - - -  03/23/11 0849 - - - - - 96 % -       Intake/Output from previous day: 11/05 0701 - 11/06 0700 In: 723 [P.O.:720; I.V.:3] Out: 800 [Urine:800] Intake/Output this shift:    Physical Exam:  Cardiovascular: RRR, no murmurs, gallops, or rubs. Pulmonary: Slightly decreased at bases bilaterally;no rales, wheezes, or rhonchi. Abdomen: Soft, non tender, bowel sounds present. Extremities: No  lower extremity edema. Wounds: Clean and dry.  No erythema or signs of infection.  Lab Results: CBC: Basename 03/22/11 0600  WBC 10.6*  HGB 9.1*  HCT 27.8*  PLT 138*   BMET:  Basename 03/22/11 0956  NA 136  K 3.7  CL 100  CO2 27  GLUCOSE 142*  BUN 18  CREATININE 1.01  CALCIUM 9.0    PT/INR: No results found for this basename: LABPROT,INR in the last 72 hours ABG:  INR: Will add last result for INR, ABG once components are confirmed Will add last 4 CBG results once components are confirmed  Assessment/Plan:  1. CV -Maintaining SR with occasional PVC's.  Previously had afib with RVR.  Continue current medications. 2.  Pulmonary - On 1L O2 via [place] this am.  Will assess O2 sat on RA as may need need home O2 upon discharge. 4.  ABL anemia - H/H stable.  Will continue ferrous sulfate. 5.  Remove CT sutures. 6.  Will discharge home.   Nani Skillern, PA 03/24/2011

## 2011-03-24 NOTE — Plan of Care (Signed)
Problem: Discharge Progression Outcomes Goal: Barriers To Progression Addressed/Resolved Outcome: Adequate for Discharge Pt will be discharged on home oxygen @ 3L via [place]

## 2011-03-24 NOTE — Progress Notes (Signed)
Pt with room air sats 85%...will need home oxygen.  Notified Derrian with [facility] of home O2 need.  Portable tank to be delivered to room prior to dc home.  No other home needs.   Lionel December, RN, BSN Pager # [phone]

## 2011-03-24 NOTE — Progress Notes (Signed)
Subjective:  The patient is looking forward to going home today. She denies chest pain. She is concerned about using chronic oxygen. She denies palpitations. She has not had syncope. She was able to get some sleep last p.m.  Objective:  Vital Signs in the last 24 hours: Temp:  [97.8 F (36.6 C)-98.3 F (36.8 C)] 98.1 F (36.7 C) (11/06 0421) Pulse Rate:  [69-78] 69  (11/06 0421) Resp:  [16-18] 18  (11/06 0421) BP: (120-152)/(46-59) 125/53 mmHg (11/06 0421) SpO2:  [93 %-96 %] 94 % (11/06 0421) FiO2 (%):  [28 %] 28 % (11/05 2135) Weight:  [61.735 kg (136 lb 1.6 oz)] 136 lb 1.6 oz (61.735 kg) (11/06 0421)  Intake/Output from previous day: 11/05 0701 - 11/06 0700 In: 723 [P.O.:720; I.V.:3] Out: 800 [Urine:800] Intake/Output from this shift:    Physical Exam: General appearance: Noticeable pallor and expression of some depression . Lungs: Decreased breath sounds are heard bilaterally. No wheezing Heart: No aortic regurgitation is heard. Soft out of 6 systolic murmur is heard. There is no gallop no episode Extremities: No edema is noted. Pulses: 2+ and symmetric Symmetrical and present.  Lab Results:  Basename 03/22/11 0600  WBC 10.6*  HGB 9.1*  PLT 138*    Basename 03/22/11 0956  NA 136  K 3.7  CL 100  CO2 27  GLUCOSE 142*  BUN 18  CREATININE 1.01   No results found for this basename: TROPONINI:2,CK,MB:2 in the last 72 hours Hepatic Function Panel No results found for this basename: PROT,ALBUMIN,AST,ALT,ALKPHOS,BILITOT,BILIDIR,IBILI in the last 72 hours No results found for this basename: CHOL in the last 72 hours No results found for this basename: PROTIME in the last 72 hours  Imaging: Imaging results have been reviewed  Cardiac Studies:  Assessment/Plan:  Status post aortic valve replacement.  COPD now requiring chronic home to  No significant atrial fibrillation prior to discharge.  Plans: Plan to continue amiodarone at 200 mg by mouth twice daily.  This medication will be weaned and discontinued as an outpatient. She should have clinical followup with Dr. Daneen Schick in approximately 2 weeks.  LOS: 6 days    [name] 03/24/2011, 8:47 AM

## 2011-03-24 NOTE — Progress Notes (Signed)
Pt given discharge instructions and verbalized understanding of instructions and medications.

## 2011-03-24 NOTE — Progress Notes (Signed)
CARDIAC REHAB PHASE I   PRE:  Rate/Rhythm: 76 sr  BP:  Supine:   Sitting: 132/44  Standing:    SaO2: 96% 2l  MODE:  Ambulation: 550 ft   POST:  Rate/Rhythem: 88 sr  BP:  Supine:   Sitting: 167/39  Standing:    SaO2: 85%  RA  3L O2 TO KEEP PT SATS 90% AND ABOVE   0814 - 0910  PT AMBULATED IN HALL WITH MINIMAL ASSIST < 100 FEET PT O2 SAT ON ROOM AIR DROPPED TO 85%.  INCREASED O2 TO 2L 02 SAT INCREASED TO 88%. 3L TO KEEP 02 SATS @ ABOVE 90.  EDUCATION COMPLETED PT GAVE RN PERMISSION TO REFER TO CARD REHAB II [place]. D/C VIDEO STARTED FOR PT TO VIEW.   Deatra Ina

## 2011-03-25 NOTE — Discharge Summary (Addendum)
Physician Discharge Summary  Patient ID: [name] MRN: [id number] DOB/AGE: 06/27/1929 77 y.o.  Admit date: 03/18/2011 Discharge date: 03/24/2011  Admission Diagnoses:   1.Severe aortic stenosis. 2.History of hypertenstion. 3.History of COPD. 4.History of carotid artery disease. 5.History of tobacco abuse. 6.History of hypothyroidism. 7.History of restless leg syndrome.  Discharge Diagnoses:   1.Severe aortic stenosis. 2.History of hypertenstion. 3.History of COPD. 4.History of carotid artery disease. 5.History of tobacco abuse. 6.History of hypothyroidism. 7.History of restless leg syndrome. 8.ABL anemia. 9.Afib with RVR.    Surgery:  Aortic root replacement with coronary artery implantation in (using a Medtronic freestyle aortic root porcine bioprosthetic valve by Dr. Roxy Rich on 03/18/2011.   Consultants: Treatment Team:  [name]  Discharged Condition: Improved  History of Presenting Illness:  This is a 77 year old Caucasian female with the aforementioned past medical history with who was first turned to have a heart murmur on physical exam any years ago. She was initially seen by her primary care physician and began complaining of worsening symptoms of exertional shortness of breath. Echocardiogram done 10/23/2009 revealed severe left ear, normal left ventricular function, PT mean transvalvular gradient across about the tongue was reportedly 72 and 48 MM of mercury respectively. The peak velocity across her valve was 4.23 m/s. There were no other significant abnormalities noted. Patient was then seen in consultation by Dr. Tamala Rich. She underwent an elective right and left heart cardiac cardiac catheterization on 02/12/2011. Results indicated normal coronary anatomy with no significant coronary artery disease. It did also confirm the presence of severe aortic stenosis with a  mean transvalvular gradient of 73 MM Hg, aortic valve was estimated to be between 0.33 and  0.37 cm and  left ventricular systolic function normal. Right heart catheterization data indicated a PA pressure of 30/10 and a pulmonary wedge pressure of 7. The patient was then referred to Dr. Candace Rich and for consideration of aortic valve replacement. She was seen in the office by him initially on 03/09/2011.Prior to undergoing the surgery, however, the patient needed to have a CT angiogram of the chest, abdomen and pelvis. This was done on 03/11/2011. Results revealed fairly extensive atherosclerotic disease in the descending abdominal aorta and iliac vessels the circumferential calcification as well as ostial stenosis of the right common iliac and left common iliac arteries. The thoracic aorta was relatively free of significant disease although there was some calcification in aortic arch. A thorough discussion was held with the patient regarding potential risk,s complications, and benefits of undergoing aortic valve replacement. Dr. Roxy Rich also discussed that with the findings of the CAT scan that he did not fever a mini thoracotomy approach in order to replace the aortic valve (as this would  involve femoral cannulation) but rather would proceed with a partial upper sternotomy or full convention her sternotomy.  Please  note the patient specifically requested that  her valve be replaced using a bioprosthetic tissue valve and not a mechanical valve. She understood that there was a small but significant risk of late  structural valve deterioration and failure depending on her longevity.  The patient agreed to the surgery and was admitted to [facility] on 03/18/2011 in order to undergo valve replacement.  Hospital Course:   Patient remained afebrile and hemodynamically stable. Her Swan-Ganz, A-line, chest tubes and Foley were removed fairly early in her postoperative course. She did require AAI pacing initially and maintained sinus rhythm; however, she then went  into atrial fibrillation with RVR (after she had been transferred from the ICU to [facility] for further convalescence).   She was given an amiodarone bolus followed by an amiodarone drip.  She converted to sinus rhythm and her heart rate was in the 60s on 11/4. A discussion was had with  Dr. Radford Rich (cardiologist) and it was decided to stop her Lopressor and continue her on amiodarone orally. A TSH was checked and found to be 6.483. She was found to be volume overloaded and diuresed accordingly. She was found to have acute blood loss anemia. Her H&H was low as 8.5 and 25.4 She was placed was placed on iron. Her last H&H of 03/22/2011 was up to 9.1 and 27.8. She was also found to have leukocytosis postoperatively. Her white blood cell count went as high as 15,200.  She was not found to have any signs of wound infection, last chest x-ray showed some atelectasis but no signs of pneumonia, and a urinalysis that was checked  was negative. Her last white count then did normalize at 10,600. She was also found to have, thrombocytopenia postoperatively. Her platelet count low as 97,000 her last white count was up to 130,000. She then developed loose stools and her Dulcolax and Colace were stopped, and she had no further diarrhea. She continued to progress with cardiac rehabilitation. She was still requiring a couple liters oxygen via nasal cannula to keep her O2 sat greater than request to 90% and she  was able to be weaned to 2-3 L. She will require home O2 upon discharge. Epicardial pacing wires and chest tube sutures were removed. Patient has are then tolerating a diet and continues to maintain sinus rhythm. She was felt surgically stable for discharge on 03/24/2011.    Recent vital signs:  Filed Vitals:   03/24/11 0421  BP: 125/53  Pulse: 69  Temp: 98.1 F (36.7 C)  Resp: 18    Physical Exam:  Cardiovascular: RRR, no murmurs, gallops, or rubs.  Pulmonary: Slightly decreased at bases bilaterally;no rales,  wheezes, or rhonchi.  Abdomen: Soft, non tender, bowel sounds present.  Extremities: No lower extremity edema.  Wounds: Clean and dry. No erythema or signs of infection.   Discharge Medications:   Discharge Medication List as of 03/24/2011  5:41 PM    START taking these medications   Details  amiodarone (PACERONE) 400 MG tablet Take 400 mg twice daily x 1 week, then 200 mg twice daily, Print    aspirin EC 325 MG EC tablet Take 1 tablet (325 mg total) by mouth daily., Starting 03/23/2011, Until Thu 04/02/11, Normal    oxyCODONE (OXY IR/ROXICODONE) 5 MG immediate release tablet Take 1-2 tablets (5-10 mg total) by mouth every 3 (three) hours as needed., Starting 03/23/2011, Until Thu 04/02/11, Print      CONTINUE these medications which have NOT CHANGED   Details  budesonide-formoterol (SYMBICORT) 160-4.5 MCG/ACT inhaler Inhale 2 puffs into the lungs 2 (two) times daily.  , Until Discontinued, Historical Med    cetirizine (ZYRTEC) 10 MG tablet Take 10 mg by mouth daily.  , Until Discontinued, Historical Med    Ferrous Sulfate (IRON) 325 (65 FE) MG TABS Take 325 mg by mouth every other day. , Until Discontinued, Historical Med  levothyroxine (SYNTHROID, LEVOTHROID) 125 MCG tablet Take 125 mcg by mouth daily.  , Until Discontinued, Historical Med    Multiple Vitamins-Minerals (MULTIVITAMINS THER. W/MINERALS) TABS Take 1 tablet by mouth daily.  , Until Discontinued, Historical Med    simvastatin (ZOCOR) 20 MG tablet Take 20 mg by mouth at bedtime.  , Until Discontinued, Historical Med    tiotropium (SPIRIVA) 18 MCG inhalation capsule Place 18 mcg into inhaler and inhale daily.  , Until Discontinued, Historical Med    vitamin C (ASCORBIC ACID) 500 MG tablet Take 500 mg by mouth daily.  , Until Discontinued, Historical Med    vitamin D, CHOLECALCIFEROL, 400 UNITS tablet Take 400 Units by mouth daily.  , Until Discontinued, Historical Med      STOP taking these medications      amLODipine (NORVASC) 10 MG tablet      aspirin 81 MG tablet        Lab Results  Component Value Date   WBC 10.6* 03/22/2011   HGB 9.1* 03/22/2011   HCT 27.8* 03/22/2011   MCV 97.5 03/22/2011   PLT 138* 03/22/2011   Lab Results  Component Value Date   NA 136 03/22/2011   K 3.7 03/22/2011   CL 100 03/22/2011   CO2 27 03/22/2011   CREATININE 1.01 03/22/2011   GLUCOSE 142* 03/22/2011      Latest Diagnostic Studies: Dg Chest 2 View  03/21/2011  *RADIOLOGY REPORT*  Clinical Data: Postop CABG and aortic valve replacement.  CHEST - 2 VIEW  Comparison: 03/20/2011 and 03/19/2011.  Findings: The left IJ sheath has been removed.  The heart size and mediastinal contours are stable.  There is improving pulmonary edema.  There are persistent bilateral pleural effusions with associated bibasilar atelectasis.  No pneumothorax is identified. Epicardial pacing leads remain in place.  IMPRESSION: Improving edema.  Persistent pleural effusions and bibasilar atelectasis.  Original Report Authenticated By: [name], M.D.        Disposition: Home or Self Care  Discharge Orders    Future Appointments: Provider: Department: Dept Phone: Center:   04/13/2011 12:30 PM Rexene Alberts, MD [facility] [phone] [facility]      Follow-up Information    Call Sinclair Grooms. (call for appt. in 2 weeks)    Contact information:   [address] [place], [place]. [place] [address] [phone]       Follow up with Rexene Alberts, MD. Make an appointment on 04/13/2011. (at 12:30 ( chest x-ray at 11:45))    Contact information:   [place] [phone]           Signed: Nani Rich 03/25/2011, 1:52 PM    I agree with the above discharge summary and plan for follow-up.

## 2011-03-26 NOTE — Treatment Plan (Signed)
I noticed that the patient's TSH was elevated at 6.483.  I called the patient today to ask her to make a follow up appointment with her medical doctor.  She currently takes levothyroxine 125 mcg po daily and this probably needs to be increased.

## 2011-04-07 ENCOUNTER — Telehealth: Payer: Self-pay | Admitting: Pulmonary Disease

## 2011-04-07 ENCOUNTER — Other Ambulatory Visit: Payer: Self-pay | Admitting: Thoracic Surgery (Cardiothoracic Vascular Surgery)

## 2011-04-07 DIAGNOSIS — I359 Nonrheumatic aortic valve disorder, unspecified: Secondary | ICD-10-CM

## 2011-04-07 NOTE — Telephone Encounter (Signed)
Pt states breathing is better since starting Symbicort, Spiriva, and Oxygen, however, she is not sure which one is helping. Pt had surgery 03/18/11, sent home with Incentive spirometer and is at 1000 4-5 times an hour. Used last dose of Symbicort this AM and has 3 capsules of Spiriva remaining. Scheduled pt for follow up with [facility] 11/27 @ 9:30am reg slot. Offered pt appointment today at 4:15pm but declined due to no transportation. Please advise, thank you.   Pharm[facility]: [facility]

## 2011-04-07 NOTE — Telephone Encounter (Signed)
Needs to stay on symbicort and spiriva Can call in scripts for both, and keep f/u appt with me.  Give her 6refills.

## 2011-04-08 ENCOUNTER — Telehealth: Payer: Self-pay | Admitting: Pulmonary Disease

## 2011-04-08 MED ORDER — TIOTROPIUM BROMIDE MONOHYDRATE 18 MCG IN CAPS: 18.0000 ug | ORAL_CAPSULE | Freq: Every day | RESPIRATORY_TRACT | Status: DC

## 2011-04-08 NOTE — Telephone Encounter (Signed)
Called and confirmed with pt Spiriva request. States does not need Symbicort at this time. Spiriva refill sent to [facility].

## 2011-04-08 NOTE — Telephone Encounter (Signed)
I spoke with pt and is aware of kc's recs. Pt states she found a symbicort that was given to her and did not want a rx called in until she came in to see [facility].

## 2011-04-13 ENCOUNTER — Encounter: Payer: Self-pay | Admitting: Thoracic Surgery (Cardiothoracic Vascular Surgery)

## 2011-04-13 ENCOUNTER — Ambulatory Visit (INDEPENDENT_AMBULATORY_CARE_PROVIDER_SITE_OTHER): Payer: Self-pay | Admitting: Thoracic Surgery (Cardiothoracic Vascular Surgery)

## 2011-04-13 ENCOUNTER — Ambulatory Visit
Admission: RE | Admit: 2011-04-13 | Discharge: 2011-04-13 | Disposition: A | Discharge status: Home / Self Care | Payer: Medicare Other | Source: Ambulatory Visit | Attending: Thoracic Surgery (Cardiothoracic Vascular Surgery) | Admitting: Thoracic Surgery (Cardiothoracic Vascular Surgery)

## 2011-04-13 VITALS — BP 135/70 | HR 115 | Resp 16 | Ht 64.0 in | Wt 131.0 lb

## 2011-04-13 DIAGNOSIS — Z952 Presence of prosthetic heart valve: Secondary | ICD-10-CM | POA: Insufficient documentation

## 2011-04-13 DIAGNOSIS — I4892 Unspecified atrial flutter: Secondary | ICD-10-CM

## 2011-04-13 DIAGNOSIS — I359 Nonrheumatic aortic valve disorder, unspecified: Secondary | ICD-10-CM

## 2011-04-13 DIAGNOSIS — Z954 Presence of other heart-valve replacement: Secondary | ICD-10-CM

## 2011-04-13 HISTORY — DX: Presence of prosthetic heart valve: Z95.2

## 2011-04-13 MED ORDER — AMIODARONE HCL 400 MG PO TABS: 200.0000 mg | ORAL_TABLET | Freq: Two times a day (BID) | ORAL | Status: DC

## 2011-04-13 MED ORDER — DILTIAZEM HCL ER COATED BEADS 120 MG PO CP24: 120.0000 mg | ORAL_CAPSULE | Freq: Every day | ORAL | Status: DC

## 2011-04-13 NOTE — Patient Instructions (Signed)
Increase amiodarone to 200 mg twice daily and begin Cardizem CD 120 mg daily.  Schedule appt with Dr Tamala Julian early next week or sooner if symptoms worsen.

## 2011-04-13 NOTE — Progress Notes (Signed)
[address]            ,[place] [address]          [phone]     CARDIOTHORACIC SURGERY OFFICE NOTE  Referring Provider is Sinclair Grooms, MD PCP is Horton Finer, MD, MD   HPI:  Patient returns for follow-up having undergone aortic root replacement on 03/18/2011.  Their early postoperative hospital course was notable for some postop atrial fibrillation which converted to sinus rhythm on amiodarone therapy.  She also had increased oxygen requirement and was discharged with home oxygen therapy.  Since hospital discharge the patient had been doing fairly well. She was seen in the office last week at Dr. Tamala Julian in her dose of amiodarone was cut back to 200 mg daily. She states that last night she began feeling her heart pounded. She's not had any associated dizziness. She has not had shortness of breath. Her appetite reasonable in her activity level had been increasing acceptably well until the present time. The remainder of her review of systems unremarkable.   Current Outpatient Prescriptions  Medication Sig Dispense Refill  . amiodarone (PACERONE) 400 MG tablet Take 0.5 tablets (200 mg total) by mouth 2 (two) times daily.  200 tablet  1  . budesonide-formoterol (SYMBICORT) 160-4.5 MCG/ACT inhaler Inhale 2 puffs into the lungs 2 (two) times daily.        . cetirizine (ZYRTEC) 10 MG tablet Take 10 mg by mouth daily.        . Ferrous Sulfate (IRON) 325 (65 FE) MG TABS Take 325 mg by mouth every other day.       . levothyroxine (SYNTHROID, LEVOTHROID) 125 MCG tablet Take 125 mcg by mouth daily.        . Multiple Vitamins-Minerals (MULTIVITAMINS THER. W/MINERALS) TABS Take 1 tablet by mouth daily.        . simvastatin (ZOCOR) 20 MG tablet Take 20 mg by mouth at bedtime.        Marland Kitchen tiotropium (SPIRIVA) 18 MCG inhalation capsule Place 1 capsule (18 mcg total) into inhaler and inhale daily.  30 capsule  5  . vitamin C (ASCORBIC ACID) 500 MG tablet  Take 500 mg by mouth daily.        . vitamin D, CHOLECALCIFEROL, 400 UNITS tablet Take 400 Units by mouth daily.        Marland Kitchen DISCONTD: amiodarone (PACERONE) 400 MG tablet Take 400 mg twice daily x 1 week, then 200 mg twice daily  75 tablet  1  . DISCONTD: amiodarone (PACERONE) 400 MG tablet 200 mg daily. Take 400 mg twice daily x 1 week, then 200 mg twice daily       . diltiazem (DILT-CD) 120 MG 24 hr capsule Take 1 capsule (120 mg total) by mouth daily.  60 capsule  1      Physical Exam:   BP 135/70  Pulse 115  Resp 16  Ht _0  (1.626 m)  Wt 131 lb (59.421 kg)  BMI 22.49 kg/m2  SpO2 95%  On physical exam the patient looks good. Breath sounds are clear to auscultation and symmetrical bilaterally with slightly diminished breath sounds at both lung bases. No wheezes rales or rhonchi are noted. Her sternotomy incision is healing nicely. Her vascular exam is notable for elevated heart rate. No murmurs rubs or gallops are noted. The abdomen is soft nontender. Extremities  are warm and well-perfused. There is no lower extremity edema.  Diagnostic Tests:  Telemetry rhythm strip demonstrates atrial flutter.  Chest x-ray performed today reveals small bilateral pleural effusions left greater than right. All of the sternal wires appear intact.  Impression:  Patient had been doing fairly well 3 weeks following aortic root replacement although it now appears that she has gone into atrial flutter. Heart rate is approximately 115 to 120 and her blood pressure is stable.  Plan:  Discussed the patient's management over the telephone with Dr. Tamala Julian. We will increase her dose of amiodarone to 200 mg twice daily and begin her on Cardizem CD 120 mg daily. She will followup with Dr. Tamala Julian in the office early next week or sooner if her symptoms worsen during the interim period of time. She is been advised to avoid any sort of strenuous physical activity for the time being. We will plan to see her back in 3  weeks.    [name]. Roxy Manns, MD

## 2011-04-14 ENCOUNTER — Encounter: Payer: Self-pay | Admitting: Pulmonary Disease

## 2011-04-14 ENCOUNTER — Ambulatory Visit (INDEPENDENT_AMBULATORY_CARE_PROVIDER_SITE_OTHER): Payer: Medicare Other | Admitting: Pulmonary Disease

## 2011-04-14 VITALS — BP 100/62 | HR 126 | Temp 97.9°F | Ht 64.0 in | Wt 131.2 lb

## 2011-04-14 DIAGNOSIS — J449 Chronic obstructive pulmonary disease, unspecified: Secondary | ICD-10-CM

## 2011-04-14 NOTE — Patient Instructions (Signed)
Stay on symbicort, but can come off spiriva for a 4 week trial to see if you really need it. Will have your oxygen company check your levels off oxygen at rest and with exertion. Go ahead and start cardiac rehab If doing well, followup with me in 30mo.

## 2011-04-14 NOTE — Progress Notes (Signed)
  Subjective:    Patient ID: [name], female    DOB: 02/22/1930, 77 y.o.   MRN: [id number]  HPI The patient comes in today for followup of her moderate to severe COPD.  She is status post valve replacement surgery, and did fairly well with this.  She was sent home on oxygen after the surgery.  She comes in today where she feels that her breathing is much improved.  She is wondering whether she needs to stay on Spiriva, and admits that it is a cost issue for her.  She denies any significant cough, congestion, or wheezing.  She has been referred for cardiac rehabilitation.   Review of Systems  Constitutional: Negative for fever and unexpected weight change.  HENT: Positive for rhinorrhea. Negative for ear pain, nosebleeds, congestion, sore throat, sneezing, trouble swallowing, dental problem, postnasal drip and sinus pressure.   Eyes: Negative for redness and itching.  Respiratory: Negative for cough, chest tightness, shortness of breath and wheezing.   Cardiovascular: Negative for palpitations and leg swelling.  Gastrointestinal: Negative for nausea and vomiting.  Genitourinary: Negative for dysuria.  Musculoskeletal: Negative for joint swelling.  Skin: Negative for rash.  Neurological: Negative for headaches.  Hematological: Does not bruise/bleed easily.  Psychiatric/Behavioral: Negative for dysphoric mood. The patient is not nervous/anxious.        Objective:   Physical Exam Thin female in no acute distress Nose without purulence or discharge noted Chest with decreased breath sounds, but no wheezes or rhonchi Cardiac exam was tachycardic but regular rhythm Lower extremities without significant edema, no cyanosis noted Alert and oriented, moves all 4 extremities.       Assessment & Plan:

## 2011-04-14 NOTE — Assessment & Plan Note (Signed)
The patient is doing fairly well from a pulmonary standpoint, and got through her valvular heart surgery without significant pulmonary complications.  She is having a hard time affording her Spiriva, and have asked her to discontinue the medication for the next 4 weeks to see if she can get by on symbicort alone.  Will also reevaluate her oxygen saturations to see if she still needs oxygen.  I have stressed to her the importance of conditioning, and she has already been referred to cardiac rehabilitation.

## 2011-04-15 ENCOUNTER — Telehealth: Payer: Self-pay | Admitting: Pulmonary Disease

## 2011-04-15 MED ORDER — BUDESONIDE-FORMOTEROL FUMARATE 160-4.5 MCG/ACT IN AERO: 2.0000 | INHALATION_SPRAY | Freq: Two times a day (BID) | RESPIRATORY_TRACT | Status: DC

## 2011-04-15 NOTE — Telephone Encounter (Signed)
I spoke with pt and she states she needs a refill on her symbicort to [address]. I advised will send rx for pt and nothing further was needed

## 2011-04-28 ENCOUNTER — Other Ambulatory Visit: Payer: Self-pay | Admitting: Pulmonary Disease

## 2011-04-28 DIAGNOSIS — J449 Chronic obstructive pulmonary disease, unspecified: Secondary | ICD-10-CM

## 2011-05-04 ENCOUNTER — Ambulatory Visit (INDEPENDENT_AMBULATORY_CARE_PROVIDER_SITE_OTHER): Payer: Self-pay | Admitting: Thoracic Surgery (Cardiothoracic Vascular Surgery)

## 2011-05-04 ENCOUNTER — Encounter: Payer: Self-pay | Admitting: Thoracic Surgery (Cardiothoracic Vascular Surgery)

## 2011-05-04 VITALS — BP 152/86 | HR 110 | Resp 18 | Ht 64.0 in | Wt 125.0 lb

## 2011-05-04 DIAGNOSIS — Z954 Presence of other heart-valve replacement: Secondary | ICD-10-CM

## 2011-05-04 DIAGNOSIS — Z952 Presence of prosthetic heart valve: Secondary | ICD-10-CM

## 2011-05-04 NOTE — Progress Notes (Signed)
[address]            [place],[name] [address]          [phone]     CARDIOTHORACIC SURGERY OFFICE NOTE  Referring Provider is Daneen Schick, MD PCP is Horton Finer, MD, MD   HPI:  Patient returns for follow-up status post aortic root replacement. She was last seen in the office 3 weeks ago at which time she was noted to be in atrial flutter with rapid ventricular rate. She was started on Cardizem CD at that time. She was seen in followup by Dr. Tamala Julian earlier today. She remains in atrial flutter with elevated pulse rate. He is instructed her to increase her dose of Cardizem 120 mg twice daily instead of once daily. He plans DC cardioversion the first week of January if she does not convert spontaneously between now and then.  The patient reports that associated with her elevated pulse rate she is a little more dyspneic with activity. However, she is still ambulating quite a bit and overall getting around fairly well. She has minimal residual soreness in her chest. Her appetite is still marginal. She think she is eating enough however. She has no other complaints.   Current Outpatient Prescriptions  Medication Sig Dispense Refill  . amiodarone (PACERONE) 400 MG tablet Take 200 mg by mouth daily.        . budesonide-formoterol (SYMBICORT) 160-4.5 MCG/ACT inhaler Inhale 2 puffs into the lungs 2 (two) times daily.  1 Inhaler  3  . cetirizine (ZYRTEC) 10 MG tablet Take 10 mg by mouth daily.        Marland Kitchen diltiazem (CARDIZEM CD) 120 MG 24 hr capsule Take 120 mg by mouth 2 (two) times daily.        . Ferrous Sulfate (IRON) 325 (65 FE) MG TABS Take 325 mg by mouth every other day.       . levothyroxine (SYNTHROID, LEVOTHROID) 150 MCG tablet Take 150 mcg by mouth daily.        . Multiple Vitamins-Minerals (MULTIVITAMINS THER. W/MINERALS) TABS Take 1 tablet by mouth daily.        . simvastatin (ZOCOR) 20 MG tablet Take 20 mg by mouth at bedtime.        Marland Kitchen  tiotropium (SPIRIVA) 18 MCG inhalation capsule Place 1 capsule (18 mcg total) into inhaler and inhale daily.  30 capsule  5  . vitamin D, CHOLECALCIFEROL, 400 UNITS tablet Take 400 Units by mouth daily.        Marland Kitchen DISCONTD: amiodarone (PACERONE) 400 MG tablet Take 0.5 tablets (200 mg total) by mouth 2 (two) times daily.  200 tablet  1      Physical Exam:   BP 152/86  Pulse 110  Resp 18  Ht _0  (1.626 m)  Wt 56.7 kg (125 lb)  BMI 21.46 kg/m2  SpO2 93%  HEENT:  Unremarkable  Chest:   Clear to auscultation with symmetrical breath sounds. Sternal incision is healing nicely. The sternum is stable on palpation.  CV:   Elevated heart rate was irregular pulse  Abdomen:  Soft and nontender  Extremities:  Warm and well-perfused with no edema  Diagnostic Tests:  Not applicable   Impression:  Rapid atrial fibrillation with in atrial flutter for which the patient was seen just a few hours ago by Dr. Tamala Julian. He has instructed the patient to increase  her dose of Cardizem CD to improve her rate control. He plans DC cardioversion the first week of January. The patient otherwise seems to be getting along reasonably well.  Plan:  We will plan to see the back for further followup in 2 months. All of her questions been addressed.   [name]. Roxy Manns, MD 05/04/2011 3:30 PM

## 2011-05-04 NOTE — Patient Instructions (Signed)
Continue to follow up with Dr. Tamala Julian for management of irregular heart rhythm.  No driving an automobile until the heart rhythm problem has been resolved.

## 2011-05-06 ENCOUNTER — Encounter (HOSPITAL_COMMUNITY): Payer: Self-pay

## 2011-05-13 ENCOUNTER — Encounter (HOSPITAL_COMMUNITY): Payer: Self-pay | Admitting: Emergency Medicine

## 2011-05-13 ENCOUNTER — Emergency Department (HOSPITAL_COMMUNITY): Payer: Medicare Other

## 2011-05-13 ENCOUNTER — Other Ambulatory Visit: Payer: Self-pay

## 2011-05-13 ENCOUNTER — Observation Stay (HOSPITAL_COMMUNITY)
Admission: EM | Admit: 2011-05-13 | Discharge: 2011-05-14 | Disposition: A | Discharge status: Home / Self Care | Payer: Medicare Other | Source: Ambulatory Visit | Attending: Interventional Cardiology | Admitting: Interventional Cardiology

## 2011-05-13 DIAGNOSIS — Z954 Presence of other heart-valve replacement: Secondary | ICD-10-CM | POA: Insufficient documentation

## 2011-05-13 DIAGNOSIS — R0789 Other chest pain: Principal | ICD-10-CM | POA: Insufficient documentation

## 2011-05-13 DIAGNOSIS — I4892 Unspecified atrial flutter: Secondary | ICD-10-CM

## 2011-05-13 DIAGNOSIS — R079 Chest pain, unspecified: Secondary | ICD-10-CM

## 2011-05-13 LAB — URINALYSIS, ROUTINE W REFLEX MICROSCOPIC
Glucose, UA: NEGATIVE mg/dL
Leukocytes, UA: NEGATIVE
Protein, ur: NEGATIVE mg/dL
pH: 7 (ref 5.0–8.0)

## 2011-05-13 LAB — POCT I-STAT TROPONIN I: Troponin i, poc: 0.01 ng/mL (ref 0.00–0.08)

## 2011-05-13 LAB — BASIC METABOLIC PANEL
BUN: 18 mg/dL (ref 6–23)
Chloride: 100 mEq/L (ref 96–112)
Creatinine, Ser: 1.05 mg/dL (ref 0.50–1.10)
GFR calc Af Amer: 57 mL/min — ABNORMAL LOW (ref >90)
Glucose, Bld: 104 mg/dL — ABNORMAL HIGH (ref 70–99)

## 2011-05-13 LAB — DIFFERENTIAL
Basophils Relative: 0 % (ref 0–1)
Eosinophils Absolute: 0.3 10*3/uL (ref 0.0–0.7)
Lymphs Abs: 1.5 10*3/uL (ref 0.7–4.0)
Monocytes Absolute: 1 10*3/uL (ref 0.1–1.0)
Monocytes Relative: 11 % (ref 3–12)
Neutro Abs: 6.6 10*3/uL (ref 1.7–7.7)

## 2011-05-13 LAB — CBC
HCT: 45.6 % (ref 36.0–46.0)
Hemoglobin: 14.5 g/dL (ref 12.0–15.0)
MCH: 30.5 pg (ref 26.0–34.0)
MCHC: 31.8 g/dL (ref 30.0–36.0)

## 2011-05-13 LAB — URINE MICROSCOPIC-ADD ON

## 2011-05-13 MED ORDER — LORATADINE 10 MG PO TABS
10.0000 mg | ORAL_TABLET | Freq: Every day | ORAL | Status: DC
  Administered 2011-05-14: 10 mg via ORAL
  Filled 2011-05-13: qty 1

## 2011-05-13 MED ORDER — WARFARIN SODIUM 2.5 MG PO TABS
2.5000 mg | ORAL_TABLET | Freq: Every day | ORAL | Status: DC
  Filled 2011-05-13: qty 2

## 2011-05-13 MED ORDER — TIOTROPIUM BROMIDE MONOHYDRATE 18 MCG IN CAPS
18.0000 ug | ORAL_CAPSULE | Freq: Every day | RESPIRATORY_TRACT | Status: DC
  Administered 2011-05-14: 18 ug via RESPIRATORY_TRACT
  Filled 2011-05-13: qty 5

## 2011-05-13 MED ORDER — SIMVASTATIN 20 MG PO TABS
20.0000 mg | ORAL_TABLET | Freq: Every day | ORAL | Status: DC
  Administered 2011-05-14: 20 mg via ORAL
  Filled 2011-05-13 (×2): qty 1

## 2011-05-13 MED ORDER — ONDANSETRON HCL 4 MG/2ML IJ SOLN: 4.0000 mg | Freq: Four times a day (QID) | INTRAMUSCULAR | Status: DC | PRN

## 2011-05-13 MED ORDER — ACETAMINOPHEN 325 MG PO TABS: 650.0000 mg | ORAL_TABLET | ORAL | Status: DC | PRN

## 2011-05-13 MED ORDER — DILTIAZEM HCL 50 MG/10ML IV SOLN
10.0000 mg | Freq: Once | INTRAVENOUS | Status: AC
  Administered 2011-05-13: 10 mg via INTRAVENOUS
  Filled 2011-05-13: qty 2

## 2011-05-13 MED ORDER — NITROGLYCERIN 0.4 MG SL SUBL: 0.4000 mg | SUBLINGUAL_TABLET | SUBLINGUAL | Status: DC | PRN

## 2011-05-13 MED ORDER — ASPIRIN 81 MG PO CHEW
324.0000 mg | CHEWABLE_TABLET | Freq: Once | ORAL | Status: AC
  Administered 2011-05-13: 324 mg via ORAL
  Filled 2011-05-13: qty 4

## 2011-05-13 MED ORDER — DILTIAZEM HCL 100 MG IV SOLR
5.0000 mg/h | INTRAVENOUS | Status: DC
  Filled 2011-05-13 (×2): qty 100

## 2011-05-13 MED ORDER — DILTIAZEM HCL 100 MG IV SOLR
5.0000 mg/h | INTRAVENOUS | Status: DC
  Administered 2011-05-13: 5 mg/h via INTRAVENOUS
  Filled 2011-05-13: qty 100

## 2011-05-13 MED ORDER — AMIODARONE HCL 200 MG PO TABS
200.0000 mg | ORAL_TABLET | Freq: Every day | ORAL | Status: DC
  Administered 2011-05-14 (×2): 200 mg via ORAL
  Filled 2011-05-13 (×2): qty 1

## 2011-05-13 MED ORDER — SODIUM CHLORIDE 0.9 % IV SOLN
INTRAVENOUS | Status: DC
  Administered 2011-05-13: 20:00:00 via INTRAVENOUS

## 2011-05-13 MED ORDER — CHOLECALCIFEROL 10 MCG (400 UNIT) PO TABS
400.0000 [IU] | ORAL_TABLET | Freq: Every day | ORAL | Status: DC
  Administered 2011-05-14: 400 [IU] via ORAL
  Filled 2011-05-13: qty 1

## 2011-05-13 MED ORDER — FERROUS SULFATE 325 (65 FE) MG PO TABS
325.0000 mg | ORAL_TABLET | ORAL | Status: DC
  Administered 2011-05-14: 325 mg via ORAL
  Filled 2011-05-13: qty 1

## 2011-05-13 MED ORDER — WARFARIN SODIUM 5 MG PO TABS: 5.0000 mg | ORAL_TABLET | ORAL | Status: DC

## 2011-05-13 MED ORDER — BUDESONIDE-FORMOTEROL FUMARATE 160-4.5 MCG/ACT IN AERO
2.0000 | INHALATION_SPRAY | Freq: Two times a day (BID) | RESPIRATORY_TRACT | Status: DC
  Administered 2011-05-14: 2 via RESPIRATORY_TRACT
  Filled 2011-05-13: qty 6

## 2011-05-13 MED ORDER — LEVOTHYROXINE SODIUM 150 MCG PO TABS
150.0000 ug | ORAL_TABLET | ORAL | Status: DC
  Administered 2011-05-14: 150 ug via ORAL
  Filled 2011-05-13 (×2): qty 1

## 2011-05-13 MED ORDER — WARFARIN SODIUM 2.5 MG PO TABS
2.5000 mg | ORAL_TABLET | ORAL | Status: DC
  Administered 2011-05-14: 2.5 mg via ORAL
  Filled 2011-05-13 (×2): qty 1

## 2011-05-13 MED ORDER — THERA M PLUS PO TABS
1.0000 | ORAL_TABLET | Freq: Every day | ORAL | Status: DC
  Filled 2011-05-13: qty 1

## 2011-05-13 NOTE — Progress Notes (Signed)
Spiritual Care Note:  Pt arrived under a CODE STEMI in ED. Pt's husband was escorted to ED 4 by volunteer before Chaplain could meet with him. From husband I was told pt had open heart procedure in Oct and a relative that works at [facility] took her blood pressure and found it very high, so she was sent to [facility] ED for evaluation. CODE STEMI downgraded/cancelled after medical evaluation.   Pt very anxious about being told her heart is out of rhythm. Part of her elevated blood pressure may be the result of this anxiety. Using a calming treatment, pt seemed to relax. Recommend calming and comforting continue.  Pt does not wish a hospital stay. Chaplain support may be required should news of a stay be for several days.  Clyda Hurdle Chaplain 8:03 PM 05/13/11

## 2011-05-13 NOTE — ED Notes (Addendum)
Report called to 2000; pt denies CP; HR controlled on 66m/hr cardizem with hr 80-90's; pt reports that she does have to void, offered to take pt to bathroom, but wanted to wait until got to room; BMortimer FriesRN transporting pt on zoll with family at bedside & RN transporting aware that pt needed UA when she voids upstairs

## 2011-05-13 NOTE — H&P (Signed)
Cardiology H&P  Primary Care Povider: Horton Finer, MD, MD Primary Cardiologist: Daneen Schick, [facility]   HPI: [name] is a 77 y.o.female with COPD and AV disease s/p AVR and root replacement in October of 2012 who presents tonight with a brief episode of atypical chest pain and aflutter with RVR. She developed aflutter a few weeks ago and was started on coumadin by Dr. Tamala Julian with plans for DCCV during the first week of January.  She is also on amiodarone 25m daily. She reports that she really came to the hospital due to elevated BP and the chest discomfort.  Yesterday she noted her BP to be as high as 150/110 without symptoms. Today she developed chest discomfort for about 30 minutes at rest that resolved spontaneously.  It was not exertional.  She is currently asymptomatic but tachycardic at 120-130bpm.  She has not had any SOB or DOE.  Her family is very concerned about the heart rate and would like to proceed with DCCV as soon as possible.  Past Medical History  Diagnosis Date  . Hypertension   . Osteopenia   . Hypothyroid   . Aortic stenosis   . Carotid artery disease   . Hypercalcemia   . Pressure urticaria   . Allergic rhinitis   . RLS (restless legs syndrome)   . COPD (chronic obstructive pulmonary disease)   . Colon polyps   . AS (aortic stenosis) 03/06/2011    Past Surgical History  Procedure Date  . Breast lumpectomy     Left  . Foot surgery     Morton's neuroma  . Knee arthroscopy     Left  . Cholecystectomy   . Cataract extraction   . Aortic root replacement 03/18/2011    227mMedtronic Freestyle Porcine aortic root with reimplantation of coronary arteries    Family History  Problem Relation Age of Onset  . Emphysema Maternal Aunt   . Asthma Maternal Aunt   . Asthma Maternal Uncle   . Heart disease Maternal Grandmother   . Cancer Mother     bladder  . Lung cancer Father   . Breast cancer Sister   . Uterine cancer Sister     Social  History:  reports that she quit smoking about 9 months ago. Her smoking use included Cigarettes. She has a 55.8 pack-year smoking history. She does not have any smokeless tobacco history on file. She reports that she does not drink alcohol or use illicit drugs.  Allergies:  Allergies  Allergen Reactions  . Biaxin     Mouth/throat sore  . Penicillins Itching and Rash    Current Facility-Administered Medications  Medication Dose Route Frequency Provider Last Rate Last Dose  . 0.9 %  sodium chloride infusion   Intravenous Continuous StEzequiel EssexMD 100 mL/hr at 05/13/11 2002    . aspirin chewable tablet 324 mg  324 mg Oral Once StEzequiel EssexMD   324 mg at 05/13/11 1938  . diltiazem (CARDIZEM) 100 mg in dextrose 5 % 100 mL infusion  5-15 mg/hr Intravenous Titrated StEzequiel EssexMD 15 mL/hr at 05/13/11 2044 15 mg/hr at 05/13/11 2044  . diltiazem (CARDIZEM) injection SOLN 10 mg  10 mg Intravenous Once StEzequiel EssexMD   10 mg at 05/13/11 2000   Current Outpatient Prescriptions  Medication Sig Dispense Refill  . amiodarone (PACERONE) 200 MG tablet Take 200 mg by mouth daily.        . budesonide-formoterol (SYMBICORT) 160-4.5 MCG/ACT inhaler Inhale 2 puffs  into the lungs 2 (two) times daily.  1 Inhaler  3  . cetirizine (ZYRTEC) 10 MG tablet Take 10 mg by mouth daily.        Marland Kitchen diltiazem (CARDIZEM CD) 120 MG 24 hr capsule Take 120 mg by mouth 2 (two) times daily.        . Ferrous Sulfate (IRON) 325 (65 FE) MG TABS Take 325 mg by mouth every other day.       . levothyroxine (SYNTHROID, LEVOTHROID) 150 MCG tablet Take 150 mcg by mouth daily.        . Multiple Vitamins-Minerals (MULTIVITAMINS THER. W/MINERALS) TABS Take 1 tablet by mouth daily.        . simvastatin (ZOCOR) 20 MG tablet Take 20 mg by mouth at bedtime.        Marland Kitchen tiotropium (SPIRIVA) 18 MCG inhalation capsule Place 1 capsule (18 mcg total) into inhaler and inhale daily.  30 capsule  5  . vitamin D, CHOLECALCIFEROL, 400 UNITS  tablet Take 400 Units by mouth daily.        Marland Kitchen warfarin (COUMADIN) 2.5 MG tablet Take 2.5-5 mg by mouth daily. On Tuesday, Wednesday, Thursday, Saturday, and Sunday, take 1 tablet (2.5 MG). On Mondays and Fridays, take 2 tablets (5 MG)         ROS: A full review of systems is obtained and is negative except as noted in the HPI.  Physical Exam: Blood pressure 156/95, pulse 114, temperature 98.1 F (36.7 C), temperature source Oral, resp. rate 18, SpO2 95.00%.  GENERAL: no acute distress.  EYES: Extra ocular movements are intact. There is no lid lag. Sclera is anicteric.  ENT: Oropharynx is clear. Dentition is within normal limits.  NECK: Supple. The thyroid is not enlarged.  LYMPH: There are no masses or lymphadenopathy present.  HEART: Regular rate and rhythm with no m/g/r.  Normal S1/S2. No JVD LUNGS: Decreased BS at left base There are no rales, rhonchi, or wheezes.  ABDOMEN: Soft, non-tender, and non-distended with normoactive bowel sounds. There is no hepatosplenomegaly.  EXTREMITIES: No clubbing, cyanosis, or edema.  PULSES: Carotids were +2 and equal bilaterally with no bruits. Femoral pulses were +2 and equal bilaterally. DP/PT pulses were +1 and equal bilaterally.  SKIN: Warm, dry, and intact.  NEUROLOGIC: The patient was oriented to person, place, and time. No overt neurologic deficits were detected.  PSYCH: Normal judgment and insight, mood is appropriate.   Results:  Results for orders placed during the hospital encounter of 05/13/11 (from the past 24 hour(s))  CBC     Status: Normal   Collection Time   05/13/11  7:27 PM      Component Value Range   WBC 9.4  4.0 - 10.5 (K/uL)   RBC 4.75  3.87 - 5.11 (MIL/uL)   Hemoglobin 14.5  12.0 - 15.0 (g/dL)   HCT 45.6  36.0 - 46.0 (%)   MCV 96.0  78.0 - 100.0 (fL)   MCH 30.5  26.0 - 34.0 (pg)   MCHC 31.8  30.0 - 36.0 (g/dL)   RDW 14.2  11.5 - 15.5 (%)   Platelets 236  150 - 400 (K/uL)  DIFFERENTIAL     Status: Normal    Collection Time   05/13/11  7:27 PM      Component Value Range   Neutrophils Relative 70  43 - 77 (%)   Neutro Abs 6.6  1.7 - 7.7 (K/uL)   Lymphocytes Relative 16  12 - 46 (%)  Lymphs Abs 1.5  0.7 - 4.0 (K/uL)   Monocytes Relative 11  3 - 12 (%)   Monocytes Absolute 1.0  0.1 - 1.0 (K/uL)   Eosinophils Relative 3  0 - 5 (%)   Eosinophils Absolute 0.3  0.0 - 0.7 (K/uL)   Basophils Relative 0  0 - 1 (%)   Basophils Absolute 0.0  0.0 - 0.1 (K/uL)  BASIC METABOLIC PANEL     Status: Abnormal   Collection Time   05/13/11  7:27 PM      Component Value Range   Sodium 138  135 - 145 (mEq/L)   Potassium 4.1  3.5 - 5.1 (mEq/L)   Chloride 100  96 - 112 (mEq/L)   CO2 29  19 - 32 (mEq/L)   Glucose, Bld 104 (*) 70 - 99 (mg/dL)   BUN 18  6 - 23 (mg/dL)   Creatinine, Ser 1.05  0.50 - 1.10 (mg/dL)   Calcium 10.9 (*) 8.4 - 10.5 (mg/dL)   GFR calc non Af Amer 50 (*) >90 (mL/min)   GFR calc Af Amer 57 (*) >90 (mL/min)  PROTIME-INR     Status: Abnormal   Collection Time   05/13/11  7:27 PM      Component Value Range   Prothrombin Time 27.7 (*) 11.6 - 15.2 (seconds)   INR 2.53 (*) 0.00 - 1.49   POCT I-STAT TROPONIN I     Status: Normal   Collection Time   05/13/11  7:39 PM      Component Value Range   Troponin i, poc 0.01  0.00 - 0.08 (ng/mL)   Comment 3            CXR: improved LLL effusion, otherwise clear  EKG: atypical aflutter with 2:1 AV conduction  Assessment/Plan: 77 yo WF with COPD and AS s/p AVR/root replacement in 02/2011 now here with aflutter with RVR and subsequent atypical chest discomfort. She is scheduled for elective DCCV on Jan 4th.  Given that she is now having some symptoms likely related to RVR, will bring her in for DCCV tomorrow. 1. Aflutter with RVR: atypical - tentatively plan for DCCV in AM, will need to review outpatient INR's as she has been on coumadin for 4 weeks, may need TEE - continue warfarin and amiodarone - continue diltiazem ggt tonight  2. HTN:  recently elevated, pt reports previously on HCTZ and norvasc, currently just taking diltiazem at home - restart HCTZ 38m daily   Hy Swiatek 05/13/2011, 9:18 PM

## 2011-05-13 NOTE — ED Notes (Signed)
Pt c/o midsternal CP 3 hours ago with SOB; pt denies N/V or diaphoresis; pt went to [facility] in clinic and sent here in private vehicle; pt with recent valve replacement

## 2011-05-13 NOTE — ED Provider Notes (Signed)
History     CSN: [id number]  Arrival date & time 05/13/11  2951   First MD Initiated Contact with Patient 05/13/11 1932      Chief Complaint  Patient presents with  . Chest Pain    (Consider location/radiation/quality/duration/timing/severity/associated sxs/prior treatment) HPI Comments: Seen at PCP's office earlier today. Instructed to f/u at ER immediately for heart problems.  Patient is a 78 y.o. female presenting with chest pain. The history is provided by the patient. No language interpreter was used.  Chest Pain The chest pain began 3 - 5 hours ago. Duration of episode(s) is 5 minutes. Chest pain occurs rarely. The chest pain is resolved. The pain is associated with breathing. At its most intense, the pain is at 7/10. The pain is currently at 0/10. The severity of the pain is moderate. The quality of the pain is described as sharp. The pain does not radiate. Pertinent negatives for primary symptoms include no fever, no cough, no wheezing, no abdominal pain, no nausea and no vomiting. She tried nothing for the symptoms.     Past Medical History  Diagnosis Date  . Hypertension   . Osteopenia   . Hypothyroid   . Aortic stenosis   . Carotid artery disease   . Hypercalcemia   . Pressure urticaria   . Allergic rhinitis   . RLS (restless legs syndrome)   . COPD (chronic obstructive pulmonary disease)   . Colon polyps   . AS (aortic stenosis) 03/06/2011    Past Surgical History  Procedure Date  . Breast lumpectomy     Left  . Foot surgery     Morton's neuroma  . Knee arthroscopy     Left  . Cholecystectomy   . Cataract extraction   . Aortic root replacement 03/18/2011    56m Medtronic Freestyle Porcine aortic root with reimplantation of coronary arteries    Family History  Problem Relation Age of Onset  . Emphysema Maternal Aunt   . Asthma Maternal Aunt   . Asthma Maternal Uncle   . Heart disease Maternal Grandmother   . Cancer Mother     bladder  . Lung  cancer Father   . Breast cancer Sister   . Uterine cancer Sister     History  Substance Use Topics  . Smoking status: Former Smoker -- 0.9 packs/day for 62 years    Types: Cigarettes    Quit date: 07/14/2010  . Smokeless tobacco: Not on file  . Alcohol Use: No    OB History    Grav Para Term Preterm Abortions TAB SAB Ect Mult Living                  Review of Systems  Constitutional: Negative for fever.  Respiratory: Negative for cough and wheezing.   Cardiovascular: Positive for chest pain.  Gastrointestinal: Negative for nausea, vomiting and abdominal pain.  All other systems reviewed and are negative.    Allergies  Biaxin and Penicillins  Home Medications   Current Outpatient Rx  Name Route Sig Dispense Refill  . AMIODARONE HCL 200 MG PO TABS Oral Take 200 mg by mouth daily.      . BUDESONIDE-FORMOTEROL FUMARATE 160-4.5 MCG/ACT IN AERO Inhalation Inhale 2 puffs into the lungs 2 (two) times daily. 1 Inhaler 3  . CETIRIZINE HCL 10 MG PO TABS Oral Take 10 mg by mouth daily.      .Marland KitchenDILTIAZEM HCL ER COATED BEADS 120 MG PO CP24 Oral Take 120 mg by  mouth 2 (two) times daily.      . IRON 325 (65 FE) MG PO TABS Oral Take 325 mg by mouth every other day.     Marland Kitchen LEVOTHYROXINE SODIUM 150 MCG PO TABS Oral Take 150 mcg by mouth daily.      Creed Copper M PLUS PO TABS Oral Take 1 tablet by mouth daily.      Marland Kitchen SIMVASTATIN 20 MG PO TABS Oral Take 20 mg by mouth at bedtime.      Marland Kitchen TIOTROPIUM BROMIDE MONOHYDRATE 18 MCG IN CAPS Inhalation Place 1 capsule (18 mcg total) into inhaler and inhale daily. 30 capsule 5  . VITAMIN D 400 UNITS PO TABS Oral Take 400 Units by mouth daily.      . WARFARIN SODIUM 2.5 MG PO TABS Oral Take 2.5-5 mg by mouth daily. On Tuesday, Wednesday, Thursday, Saturday, and Sunday, take 1 tablet (2.5 MG). On Mondays and Fridays, take 2 tablets (5 MG)       BP 156/95  Pulse 114  Temp(Src) 98.1 F (36.7 C) (Oral)  Resp 18  SpO2 95%  Physical Exam  Nursing note  and vitals reviewed. Constitutional: She is oriented to person, place, and time. She appears well-developed and well-nourished. No distress.  HENT:  Head: Normocephalic and atraumatic.  Eyes: EOM are normal. Pupils are equal, round, and reactive to light.  Neck: Normal range of motion. Neck supple.  Cardiovascular: Regular rhythm.  Tachycardia present.  Exam reveals no friction rub.   No murmur heard. Pulmonary/Chest: Effort normal and breath sounds normal. No respiratory distress. She has no wheezes. She has no rales.  Abdominal: Soft. She exhibits no distension. There is no tenderness. There is no rebound.  Musculoskeletal: Normal range of motion. She exhibits no edema.  Neurological: She is alert and oriented to person, place, and time.  Skin: She is not diaphoretic.    ED Course  Procedures (including critical care time)   Labs Reviewed  CBC  DIFFERENTIAL  BASIC METABOLIC PANEL  PROTIME-INR  URINALYSIS, ROUTINE W REFLEX MICROSCOPIC  I-STAT TROPONIN I   No results found. Basic metabolic panel (Final result)  Abnormal  Component (Lab Inquiry)      Result Time  NA  K  CL  CO2  GLUCOSE    05/13/11 2010  138  4.1  100  29  104 (H)           Result Time  BUN  Creatinine, Ser  CALCIUM  GFR calc non Af Amer  GFR calc Af Amer    05/13/11 2010  18  1.05  10.9 (H)  50 (L)  57 (L) The eGFR has been calculated using the CKD EPI equation. This calculation has not been validated in all clinical situations. eGFR's persistently <90 mL/min signify possible Chronic Kidney Disease.         Protime-INR (Final result)  Abnormal  Component (Lab Inquiry)      Result Time  Prothrombin Time  INR    05/13/11 1956  27.7 (H)  2.53 (H)         CBC (Final result)   Component (Lab Inquiry)      Result Time  WBC  RBC  HGB  HCT  MCV    05/13/11 1951  9.4  4.75  14.5  45.6  96.0           Result Time  MCH  MCHC  RDW  PLT    05/13/11 1951  30.5  31.8  14.2  236         Differential  (Final result)   Component (Lab Inquiry)      Result Time  NEUTRO PCT  AB NEUTRO  LYMPHO PCT  AB LYM  MONO PCT    05/13/11 1951  70  6.6  16  1.5  11           Result Time  MONO ABS  EOS PCT  EOSINO ABS  BASOS PCT  BASOS ABS    05/13/11 1951  1.0  3  0.3  0  0.0         POCT i-Stat troponin I (Final result)   Component (Lab Inquiry)      Result Time  Troponin i, poc  Comment 3    05/13/11 1950  0.01   Due to the release kinetics of cTnI, a negative result within the first hours of the onset of symptoms does not rule out myocardial infarction with certainty. If myocardial infarction is still suspected, repeat the test at appropriate intervals      1. Atrial flutter     Date: 05/13/2011  Rate:  118   Rhythm: atrial flutter  QRS Axis: normal  Intervals: normal  ST/T Wave abnormalities: normal  Conduction Disutrbances:none  Narrative Interpretation:   Old EKG Reviewed: changes noted and noted uncontrolled atrail flutter on new EKG. T     MDM  The patient is a 77 year old female who presents from her primary care clinic with tachycardia. Patient went to her primary care physician because she had an episode of a few minutes of substernal chest pain. She has been chest pain-free since 5 PM which is 2-1/2 hours ago. Was sent to the ER for further evaluation. Patient was initially seen in our triage area where an EKG was obtained. EKG computer analysis reads possible inferior STEMI. On my review and Dr. Vevelyn Francois review of the EKG, the rhythm appears to be atrial flutter as compared to a STEMI. There is no ST elevation in the inferior leads. There is a regular rhythm with a sawtooth pattern of P waves in the inferior leads. The code STEMI was canceled after speaking with the patient's cardiologist. Dr. Wyvonnia Dusky is sending the patient EKG to the patient's cardiologist. Patient had previous cardiac cath 3 months ago with a 50-70% stenosis of the LAD versus possible kinking of the  LAD. Patient is well-appearing and is currently chest pain-free. Heart sounds are normal, belly is soft and nontender. Lungs are free of wheezes rhonchi or rales. Labs ordered. Chest x-ray ordered. Patient started on diltiazem drip for her atrial flutter.  Labs show normal electrolytes. Initial troponin is negative. Patient is therapeutic on her Coumadin for her arrhythmias, with an INR of 2.3 On her examination patient's heart rate is not yet controlled on diltiazem at 5 mg an hour. Drip increased to 10 mg an hour. Cardiology consult in and is admitting for symptomatic atrial flutter.  Evelina Bucy, MD 05/13/11 [phone]

## 2011-05-13 NOTE — ED Provider Notes (Signed)
Pt seen and evaluated in ekg room.  Pt had midsternal chest pain earlier today with sob.  Denies current chest pain.  EKG showed elevations in inferior leads, changed compared to EKG of 03/22/11.  Code stemi activated, patient of Dr. Tamala Julian, [facility] cardiology.  Discussed pt with Dr. Wyvonnia Dusky who will assume care.   Threasa Beards, MD 05/13/11 [phone]

## 2011-05-13 NOTE — ED Notes (Signed)
Diltiazem increased to 103m/hr per MD.

## 2011-05-14 ENCOUNTER — Encounter (HOSPITAL_COMMUNITY)
Admission: EM | Disposition: A | Discharge status: Home / Self Care | Payer: Self-pay | Source: Ambulatory Visit | Attending: Emergency Medicine

## 2011-05-14 ENCOUNTER — Other Ambulatory Visit: Payer: Self-pay

## 2011-05-14 LAB — CARDIAC PANEL(CRET KIN+CKTOT+MB+TROPI)
Relative Index: INVALID (ref 0.0–2.5)
Relative Index: INVALID (ref 0.0–2.5)
Relative Index: INVALID (ref 0.0–2.5)
Total CK: 47 U/L (ref 7–177)
Total CK: 42 U/L (ref 7–177)

## 2011-05-14 SURGERY — CARDIOVERSION
Anesthesia: Monitor Anesthesia Care

## 2011-05-14 MED ORDER — DILTIAZEM HCL ER COATED BEADS 180 MG PO CP24: ORAL_CAPSULE | ORAL | Status: DC

## 2011-05-14 MED ORDER — HYDROCHLOROTHIAZIDE 25 MG PO TABS
25.0000 mg | ORAL_TABLET | Freq: Every day | ORAL | Status: DC
  Filled 2011-05-14: qty 1

## 2011-05-14 MED ORDER — DILTIAZEM HCL 100 MG IV SOLR
5.0000 mg/h | INTRAVENOUS | Status: DC
  Administered 2011-05-14: 20 mg/h via INTRAVENOUS

## 2011-05-14 MED ORDER — HYDROCHLOROTHIAZIDE 25 MG PO TABS: 25.0000 mg | ORAL_TABLET | Freq: Every day | ORAL | Status: DC

## 2011-05-14 MED ORDER — NITROGLYCERIN 0.4 MG SL SUBL: 0.4000 mg | SUBLINGUAL_TABLET | SUBLINGUAL | Status: DC | PRN

## 2011-05-14 MED ORDER — DILTIAZEM HCL 90 MG PO TABS
90.0000 mg | ORAL_TABLET | Freq: Four times a day (QID) | ORAL | Status: DC
  Administered 2011-05-14: 90 mg via ORAL
  Filled 2011-05-14 (×4): qty 1

## 2011-05-14 NOTE — Progress Notes (Signed)
Patient [name] is a 77 year old white female from [place], [place].  Patient came to hospital yesterday with chest pains.  Today [birth year] her birthday, and she expects to go home later today.  Patient enjoys the emotional support of her two sons, grandchildren, and great-grandchildren.  Patient expressed appreciation for Chaplain's provisions of prayer, presence, and conversation.  No follow-up needed.

## 2011-05-14 NOTE — ED Provider Notes (Signed)
I saw and evaluated the patient, reviewed the resident's note and I agree with the findings and plan.  Episode of substernal chest pain, SOB that lasted a few minutes 2 hours PTA.  Chest pain free now.  STEMI activated from triage.  On my review, EKG appears to be Atrial flutter with saw tooth pattern in inferior leads mimicking ST elevation.  Patient in no distress, no pain.  D/w Dr. Christ Kick, on call for [facility], agrees likely A-flutter and STEMI cancelled.  Sent image of EKG to Dr. Christ Kick as well.  Enzymes sent. Rate control with diltiazem begun.  CRITICAL CARE Performed by: Ezequiel Essex   Total critical care time: 45  Critical care time was exclusive of separately billable procedures and treating other patients.  Critical care was necessary to treat or prevent imminent or life-threatening deterioration.  Critical care was time spent personally by me on the following activities: development of treatment plan with patient and/or surrogate as well as nursing, discussions with consultants, evaluation of patient's response to treatment, examination of patient, obtaining history from patient or surrogate, ordering and performing treatments and interventions, ordering and review of laboratory studies, ordering and review of radiographic studies, pulse oximetry and re-evaluation of patient's condition.   Ezequiel Essex, MD 05/14/11 [phone]

## 2011-05-14 NOTE — Discharge Summary (Addendum)
Patient ID: [name] MRN: [id number] DOB/AGE: October 18, 1929 77 y.o.  Admit date: 05/13/2011 Discharge date: 05/14/2011  Primary Discharge Diagnosis Atypical chest pain Secondary Discharge Diagnosis Atrial flutter; aortic valve replacement  Significant Diagnostic Studies: ECG  Consults: none  Hospital Course: The patient came in due to chest discomfort.  Her initial ECG showed atrial flutter.  There is some concern for ST elevation so she was initially called a code STEMI.  Glucose then was called off.  She ultimately ruled out for MI with enzymes.  She did not have any chest pain while in the hospital or in the emergency room.  Her heart rate was noted to be fast.  She was started on IV diltiazem.  Her Coumadin was therapeutic.  Her IV diltiazem was changed over to oral Cardizem.  We did discuss doing a cardioversion on her.  She is also scheduled for cardioversion on January 4.  After review, we found that she has only been anticoagulated for less than 3 weeks.  Therefore, we would've had to perform TEE to safely do the cardioversion.  Since she was close to not needing a TEE prior to cardioversion, we elected to increase rate control, continue anticoagulation and some swelling keep the appointment for the January 4 cardioversion.  Her Cardizem was weaned off in her heart rate is monitor on telemetry.  At the time of discharge, her resting heart rate was in the low 80s.  This is a significant improvement from earlier today when she was in the 110s.  She wanted to go home.  She felt well ambulating.  She also stated that her heart rate has been high for several weeks since she has been in the atrial flutter.  At doctor's appointments it runs about 100 many times.  Today is her birthday and she really wanted to go home.  This is also discussed with her family.   Discharge Exam: Blood pressure 96/51, pulse 85, temperature 96.6 F (35.9 C), temperature source Oral, resp. rate 18, height _0   (1.626 m), weight 55.8 kg (123 lb 0.3 oz), SpO2 90.00%.   Irregularly irregular, rate controlled No wheezing Soft nontender No edema Labs:   Lab Results  Component Value Date   WBC 9.4 05/13/2011   HGB 14.5 05/13/2011   HCT 45.6 05/13/2011   MCV 96.0 05/13/2011   PLT 236 05/13/2011    Lab 05/13/11 1927  NA 138  K 4.1  CL 100  CO2 29  BUN 18  CREATININE 1.05  CALCIUM 10.9*  PROT --  BILITOT --  ALKPHOS --  ALT --  AST --  GLUCOSE 104*   Lab Results  Component Value Date   CKTOTAL 42 05/14/2011   CKMB 2.5 05/14/2011   TROPONINI <0.30 05/14/2011    No results found for this basename: CHOL   No results found for this basename: HDL   No results found for this basename: LDLCALC   No results found for this basename: TRIG   No results found for this basename: CHOLHDL   No results found for this basename: LDLDIRECT       EKG: Atrial flutter with rapid ventricular response  FOLLOW UP PLANS AND APPOINTMENTS Discharge Orders    Future Appointments: Provider: Department: Dept Phone: Center:   05/28/2011 8:00 AM [facility] [phone] None   06/01/2011 11:15 AM [facility] [phone] None   06/03/2011 11:15 AM [facility] [phone] None   06/05/2011  11:15 AM [facility] [phone] None   06/08/2011 11:15 AM [facility] [phone] None   06/10/2011 11:15 AM [facility] [phone] None   06/12/2011 11:15 AM [facility] [phone] None   06/15/2011 11:15 AM [facility] [phone] None   06/17/2011 11:15 AM [facility] [phone] None   06/19/2011 11:15 AM [facility] [phone] None   06/22/2011 11:15 AM [facility] [phone] None   06/24/2011 11:15 AM [facility] 15  [facility] [phone] None   06/26/2011 11:15 AM [facility] [phone] None   06/29/2011 11:15 AM [facility] [phone] None   07/01/2011 11:15 AM [facility] [phone] None   07/03/2011 11:15 AM [facility] [phone] None   07/06/2011 11:15 AM [facility] [phone] None   07/08/2011 11:15 AM [facility] [phone] None   07/10/2011 11:15 AM [facility] [phone] None   07/13/2011 10:00 AM Rexene Alberts, MD [facility] [phone] [facility]   07/13/2011 11:15 AM [facility] [phone] None   07/15/2011 11:15 AM [facility] [phone] None   07/17/2011 11:15 AM [facility] [phone] None   07/20/2011 11:15 AM [facility] [phone] None   07/22/2011 11:15 AM [facility] [phone] None   07/24/2011 11:15 AM [facility] [phone] None   07/27/2011 11:15 AM [facility] [phone] None   07/29/2011 11:15 AM [facility] [phone] None   07/31/2011 11:15 AM [facility] [phone] None   08/03/2011 11:15 AM [facility] [phone] None   08/05/2011 11:15 AM [facility] [phone] None   08/07/2011 11:15 AM [facility] [phone] None   08/10/2011 11:15 AM [facility] [phone] None   08/12/2011 11:15 AM [facility] [phone] None   08/14/2011 11:15 AM [facility] [phone] None   08/17/2011 11:15 AM [facility] [phone] None   08/19/2011 11:15 AM [facility] [phone] None   08/21/2011 11:15 AM [facility] [phone] None   08/24/2011 11:15 AM [facility] [phone] None   08/26/2011 11:15 AM [facility] [phone] None   08/28/2011 11:15 AM [facility] [phone] None   08/31/2011 11:15 AM [facility] [phone] None   09/02/2011 11:15 AM [facility] [phone] None   09/04/2011 11:15 AM [facility] [phone] None   10/13/2011 11:30 AM Kathee Delton, MD [facility] [phone] None     Current Discharge Medication List    START taking these medications   Details  hydrochlorothiazide (HYDRODIURIL) 25 MG tablet Take 1 tablet (25 mg total) by mouth daily.    nitroGLYCERIN (NITROSTAT) 0.4 MG SL tablet Place 1 tablet (0.4 mg total) under the tongue every 5 (five) minutes as needed for chest pain.      CONTINUE these medications which have CHANGED   Details  diltiazem (CARDIZEM CD) 180 MG 24 hr capsule 180 mg tablet PO twice a day Qty: 60 capsule, Refills: 11  CONTINUE these medications which have NOT CHANGED   Details  amiodarone (PACERONE) 200 MG tablet Take 200 mg by mouth daily.      budesonide-formoterol (SYMBICORT) 160-4.5 MCG/ACT inhaler Inhale 2 puffs into the lungs 2 (two) times daily. Qty: 1 Inhaler, Refills: 3    cetirizine (ZYRTEC) 10 MG tablet Take 10 mg by mouth daily.      Ferrous Sulfate (IRON) 325 (65 FE) MG TABS Take 325 mg by mouth every other day.     levothyroxine (SYNTHROID, LEVOTHROID) 150 MCG tablet Take 150 mcg by mouth daily.      Multiple Vitamins-Minerals (MULTIVITAMINS THER. W/MINERALS) TABS Take 1 tablet by mouth daily.      simvastatin (ZOCOR) 20 MG tablet Take 20 mg by mouth at bedtime.      tiotropium (SPIRIVA) 18 MCG inhalation capsule Place 1 capsule (18 mcg total) into inhaler and inhale  daily. Qty: 30 capsule, Refills: 5    vitamin D, CHOLECALCIFEROL, 400 UNITS tablet Take 400 Units by mouth daily.      warfarin (COUMADIN) 2.5 MG tablet Take 2.5-5 mg by mouth daily. On Tuesday, Wednesday, Thursday, Saturday, and Sunday, take 1 tablet (2.5 MG). On Mondays and Fridays, take 2 tablets (5 MG)        Follow-up Information    Follow up with Sinclair Grooms, MD on 05/22/2011. (cardioversion)    Contact information:   [address] [place] [address] [phone]          BRING [place] UP APPOINTMENTS  Time spent with patient to include physician time: 40 minutes in terms of coming up with plan regarding possible cardioversion, as well as ultimately setting up discharge Signed: VARANASI,JAYADEEP S. 05/14/2011, 2:31 PM  There was some confusion as to whether she was on HCTZ at home.  She will check this once she gets home.  If she was on it, she can resume.  Otherwise, she will not take this medicine.

## 2011-05-14 NOTE — Progress Notes (Signed)
SUBJECTIVE:  Feels better.  OBJECTIVE:   Vitals:   Filed Vitals:   05/13/11 2315 05/14/11 0207 05/14/11 0320 05/14/11 0530  BP: 122/58 98/54 106/64 105/62  Pulse:  73  81  Temp:    97.7 F (36.5 C)  TempSrc:    Oral  Resp:    20  Height:      Weight:      SpO2:    93%   I&O's:   Intake/Output Summary (Last 24 hours) at 05/14/11 1005 Last data filed at 05/13/11 2300  Gross per 24 hour  Intake      0 ml  Output    625 ml  Net   -625 ml   TELEMETRY: Reviewed telemetry pt in atrial flutter     PHYSICAL EXAM General: Well developed, well nourished, in no acute distress Head: Eyes PERRLA, No xanthomas.   Normal cephalic and atramatic  Lungs:   Clear bilaterally to auscultation and percussion. Heart:  Tachycardic, irregular            No carotid bruit. No JVD.  No abdominal bruits. No femoral bruits. Abdomen: Bowel sounds are positive, abdomen soft and non-tender without masses or                  Hernia's noted. Extremities: No clubbing, cyanosis or edema.   Neuro: Alert and oriented X 3. Psych:  Good affect, responds appropriately   LABS: Basic Metabolic Panel:  Basename 05/13/11 1927  NA 138  K 4.1  CL 100  CO2 29  GLUCOSE 104*  BUN 18  CREATININE 1.05  CALCIUM 10.9*  MG --  PHOS --   Liver Function Tests: No results found for this basename: AST:2,ALT:2,ALKPHOS:2,BILITOT:2,PROT:2,ALBUMIN:2 in the last 72 hours No results found for this basename: LIPASE:2,AMYLASE:2 in the last 72 hours CBC:  Basename 05/13/11 1927  WBC 9.4  NEUTROABS 6.6  HGB 14.5  HCT 45.6  MCV 96.0  PLT 236   Cardiac Enzymes:  Basename 05/14/11 0615 05/13/11 2312  CKTOTAL 38 47  CKMB 2.6 2.8  CKMBINDEX -- --  TROPONINI <0.30 <0.30   BNP: No components found with this basename: POCBNP:3 D-Dimer: No results found for this basename: DDIMER:2 in the last 72 hours Hemoglobin A1C: No results found for this basename: HGBA1C in the last 72 hours Fasting Lipid Panel: No results  found for this basename: CHOL,HDL,LDLCALC,TRIG,CHOLHDL,LDLDIRECT in the last 72 hours Thyroid Function Tests: No results found for this basename: TSH,T4TOTAL,FREET3,T3FREE,THYROIDAB in the last 72 hours Anemia Panel: No results found for this basename: VITAMINB12,FOLATE,FERRITIN,TIBC,IRON,RETICCTPCT in the last 72 hours Coag Panel:   Lab Results  Component Value Date   INR 2.83* 05/14/2011   INR 2.53* 05/13/2011   INR 1.38 03/18/2011    RADIOLOGY: Dg Chest Portable 1 View  05/13/2011  *RADIOLOGY REPORT*  Clinical Data: Shortness of breath.  Heart surgery October.  PORTABLE CHEST - 1 VIEW  Comparison: 04/13/2011.  Findings: Interval partial clearing of pleural effusions. Parenchymal changes left base/lower lingula may represent scarring and / or atelectasis rather than infiltrate.  Stability and/or clearing can be confirmed on follow-up.  Calcified aorta.  Post mediastinotomy.  Mild cardiomegaly.  No gross pneumothorax.  IMPRESSION: Interval partial clearing of pleural effusions.  Parenchymal changes left base/lower lingula may represent scarring and / or atelectasis rather than infiltrate.  Stability and/or clearing can be confirmed on follow-up.  Original Report Authenticated By: Doug Sou, M.D.      ASSESSMENT: Atrial flutter  PLAN:  1)  Discussed cardioversion but she has only been therapeutic on coumadin for <20 days.  She is close to being in the window where she can be cardioverted.  We discussed that there would be additional risk of stroke.  She could have a TEE but since she is so close to being able to avoid TEE, we decided to just have herincrease rate control.  Will plan for cardioversion in a week or so as scheduled.  Wean IV cardizem.  Start oral 90 mg q 6 hours of cardizem.  COntinue coumadin for stroke prevention.  COntinue amiodarone.  CP resolved.  Jettie Booze., MD  05/14/2011  10:05 AM

## 2011-05-21 ENCOUNTER — Other Ambulatory Visit: Payer: Self-pay | Admitting: Interventional Cardiology

## 2011-05-22 ENCOUNTER — Encounter (HOSPITAL_COMMUNITY): Payer: Self-pay | Admitting: Anesthesiology

## 2011-05-22 ENCOUNTER — Encounter (HOSPITAL_COMMUNITY)
Admission: RE | Disposition: A | Discharge status: Home / Self Care | Payer: Self-pay | Source: Ambulatory Visit | Attending: Interventional Cardiology

## 2011-05-22 ENCOUNTER — Other Ambulatory Visit: Payer: Self-pay

## 2011-05-22 ENCOUNTER — Ambulatory Visit (HOSPITAL_COMMUNITY): Payer: Medicare Other | Admitting: Anesthesiology

## 2011-05-22 ENCOUNTER — Ambulatory Visit (HOSPITAL_COMMUNITY)
Admission: RE | Admit: 2011-05-22 | Discharge: 2011-05-22 | Disposition: A | Discharge status: Home / Self Care | Payer: Medicare Other | Source: Ambulatory Visit | Attending: Interventional Cardiology | Admitting: Interventional Cardiology

## 2011-05-22 DIAGNOSIS — I4892 Unspecified atrial flutter: Secondary | ICD-10-CM | POA: Insufficient documentation

## 2011-05-22 LAB — PROTIME-INR
INR: 1.8 — ABNORMAL HIGH (ref 0.00–1.49)
Prothrombin Time: 21.2 seconds — ABNORMAL HIGH (ref 11.6–15.2)

## 2011-05-22 SURGERY — CARDIOVERSION
Anesthesia: General | Wound class: Clean

## 2011-05-22 MED ORDER — HYDROCORTISONE 1 % EX CREA
1.0000 "application " | TOPICAL_CREAM | Freq: Three times a day (TID) | CUTANEOUS | Status: DC | PRN
  Administered 2011-05-22: 1 via TOPICAL

## 2011-05-22 MED ORDER — PROPOFOL 10 MG/ML IV EMUL
INTRAVENOUS | Status: DC | PRN
  Administered 2011-05-22: 90 mg via INTRAVENOUS

## 2011-05-22 MED ORDER — HYDROCORTISONE 1 % EX CREA
TOPICAL_CREAM | CUTANEOUS | Status: AC
  Filled 2011-05-22: qty 28

## 2011-05-22 MED ORDER — SODIUM CHLORIDE 0.9 % IV SOLN: INTRAVENOUS | Status: DC

## 2011-05-22 MED ORDER — SODIUM CHLORIDE 0.9 % IV SOLN
INTRAVENOUS | Status: DC | PRN
  Administered 2011-05-22: 13:00:00 via INTRAVENOUS

## 2011-05-22 NOTE — Anesthesia Preprocedure Evaluation (Addendum)
Anesthesia Evaluation  Patient identified by MRN, date of birth, ID band Patient awake    Reviewed: Allergy & Precautions, H&P , NPO status , Patient's Chart, lab work & pertinent test results  Airway Mallampati: I  Neck ROM: Full    Dental   Pulmonary COPD clear to auscultation        Cardiovascular hypertension, Pt. on medications + dysrhythmias Atrial Fibrillation Irregular Tachycardia    Neuro/Psych    GI/Hepatic   Endo/Other  Hypothyroidism   Renal/GU      Musculoskeletal   Abdominal   Peds  Hematology   Anesthesia Other Findings   Reproductive/Obstetrics                          Anesthesia Physical Anesthesia Plan  ASA: III  Anesthesia Plan: MAC   Post-op Pain Management:    Induction: Intravenous  Airway Management Planned: Mask  Additional Equipment:   Intra-op Plan:   Post-operative Plan:   Informed Consent: I have reviewed the patients History and Physical, chart, labs and discussed the procedure including the risks, benefits and alternatives for the proposed anesthesia with the patient or authorized representative who has indicated his/her understanding and acceptance.   Dental advisory given  Plan Discussed with: CRNA, Anesthesiologist and Surgeon  Anesthesia Plan Comments:         Anesthesia Quick Evaluation

## 2011-05-22 NOTE — Discharge Summary (Signed)
Physician Discharge Summary  Patient ID: ANESSA CHARLEY MRN: [id number] DOB/AGE: 76-May-1931 76 y.o.  Admit date: 05/22/2011 Discharge date: 05/22/2011  Admission Diagnoses: Postop atrial flutter  Discharge Diagnoses: Reverted to normal sinus rhythm following cardioversion. Please see procedure note.  Discharged Condition: good  Hospital Course: Electrical cardioversion performed successfully.  Consults: Dr. Bea Laura, anesthesia  Significant Diagnostic Studies: Post cardioversion EKG  Treatments: Electrical cardioversion  Discharge Exam: Blood pressure 125/64, resp. rate 18, SpO2 99.00%. Normal neurological exam  Disposition: Home or Self Care  Medication List  As of 05/22/2011  1:39 PM   ASK your doctor about these medications         amiodarone 200 MG tablet   Commonly known as: PACERONE      budesonide-formoterol 160-4.5 MCG/ACT inhaler   Commonly known as: SYMBICORT   Inhale 2 puffs into the lungs 2 (two) times daily.      cetirizine 10 MG tablet   Commonly known as: ZYRTEC      diltiazem 180 MG 24 hr capsule   Commonly known as: CARDIZEM CD   180 mg tablet PO twice a day      hydrochlorothiazide 25 MG tablet   Commonly known as: HYDRODIURIL   Take 1 tablet (25 mg total) by mouth daily.      Iron 325 (65 FE) MG Tabs      levothyroxine 150 MCG tablet   Commonly known as: SYNTHROID, LEVOTHROID      multivitamins ther. w/minerals Tabs      nitroGLYCERIN 0.4 MG SL tablet   Commonly known as: NITROSTAT   Place 1 tablet (0.4 mg total) under the tongue every 5 (five) minutes as needed for chest pain.      simvastatin 20 MG tablet   Commonly known as: ZOCOR      tiotropium 18 MCG inhalation capsule   Commonly known as: SPIRIVA   Place 1 capsule (18 mcg total) into inhaler and inhale daily.      vitamin D (CHOLECALCIFEROL) 400 UNITS tablet      warfarin 2.5 MG tablet   Commonly known as: COUMADIN           Follow-up Information    Follow up with Sinclair Grooms, MD on 05/25/2011. (11:15A will see C. Glo Herring)    Contact information:   [address] [place] [address] [phone]          Signed: Sinclair Grooms 05/22/2011, 1:39 PM

## 2011-05-22 NOTE — Anesthesia Postprocedure Evaluation (Signed)
  Anesthesia Post-op Note  Patient: [name]  Procedure(s) Performed: * No procedures listed *  Patient Location: PACU  Anesthesia Type: MAC  Level of Consciousness: awake, alert , oriented and patient cooperative  Airway and Oxygen Therapy: Patient Spontanous Breathing  Post-op Pain: none  Post-op Assessment: Post-op Vital signs reviewed, Patient's Cardiovascular Status Stable, Respiratory Function Stable, Patent Airway, No signs of Nausea or vomiting and Pain level controlled  Post-op Vital Signs: Reviewed and stable  Complications: No apparent anesthesia complications

## 2011-05-22 NOTE — Preoperative (Signed)
Beta Blockers   Reason not to administer Beta Blockers:Not Applicable 

## 2011-05-22 NOTE — Progress Notes (Signed)
Reviewed D/C instructions with pt and family, who both verbalized understanding. Pt d/c home with family via wheelchair.

## 2011-05-22 NOTE — Progress Notes (Signed)
Dr. Tamala Julian made aware of INR of 1.8. No new orders received.

## 2011-05-22 NOTE — Transfer of Care (Signed)
Immediate Anesthesia Transfer of Care Note  Patient: [name]  Procedure(s) Performed: * No procedures listed *  Patient Location: PACU  Anesthesia Type: MAC  Level of Consciousness: awake and alert   Airway & Oxygen Therapy: Patient Spontanous Breathing and Patient connected to nasal cannula oxygen  Post-op Assessment: Report given to PACU RN and Post -op Vital signs reviewed and stable  Post vital signs: Reviewed and stable  Complications: No apparent anesthesia complications

## 2011-05-22 NOTE — Procedures (Signed)
Electrical Cardioversion Procedure Note 05/22/2011 [facility] [id number] 1930/03/30 Primary care physician: Nehemiah Settle M.D.  Cardiologist: Cheral Almas Blenda Bridegroom, M.D.  Cardiac surgeon: Darylene Price, MD  Procedure: Electrical Cardioversion Indications:  Atrial Flutter  Procedure Details Consent: Risks of procedure as well as the alternatives and risks of each were explained to the (patient/caregiver).  Consent for procedure obtained. Procedure was discussed with the patient on an outpatient basis as well including the likelihood of success, the risks, and the possible natural history following cardioversion.  Time Out: Verified patient identification, verified procedure, site/side was marked, verified correct patient position, special equipment/implants available, medications/allergies/relevent history reviewed, required imaging and test results available.  Performed  Patient placed on cardiac monitor, pulse oximetry, supplemental oxygen as necessary.  Sedation given: Propofol 80 mg IV Pacer pads placed anterior and posterior chest.  Cardioverted 1 time(s) successfully.  Cardioverted at 100 J of biphasic energy, normal sinus rhythm without pauses or ventricular ectopy..  Evaluation Findings: Post procedure EKG shows: NSR Complications: None Patient did tolerate procedure well.   Sinclair Grooms 05/22/2011, 1:21 PM

## 2011-05-25 ENCOUNTER — Encounter (HOSPITAL_COMMUNITY): Payer: Self-pay | Admitting: Interventional Cardiology

## 2011-05-28 ENCOUNTER — Encounter (HOSPITAL_COMMUNITY)
Admission: RE | Admit: 2011-05-28 | Discharge: 2011-05-28 | Disposition: A | Discharge status: Home / Self Care | Payer: Medicare Other | Source: Ambulatory Visit | Attending: Interventional Cardiology | Admitting: Interventional Cardiology

## 2011-05-28 DIAGNOSIS — I10 Essential (primary) hypertension: Secondary | ICD-10-CM | POA: Insufficient documentation

## 2011-05-28 DIAGNOSIS — J4489 Other specified chronic obstructive pulmonary disease: Secondary | ICD-10-CM | POA: Insufficient documentation

## 2011-05-28 DIAGNOSIS — I779 Disorder of arteries and arterioles, unspecified: Secondary | ICD-10-CM | POA: Insufficient documentation

## 2011-05-28 DIAGNOSIS — I4892 Unspecified atrial flutter: Secondary | ICD-10-CM | POA: Insufficient documentation

## 2011-05-28 DIAGNOSIS — I359 Nonrheumatic aortic valve disorder, unspecified: Secondary | ICD-10-CM | POA: Insufficient documentation

## 2011-05-28 DIAGNOSIS — I4891 Unspecified atrial fibrillation: Secondary | ICD-10-CM | POA: Insufficient documentation

## 2011-05-28 DIAGNOSIS — D696 Thrombocytopenia, unspecified: Secondary | ICD-10-CM | POA: Insufficient documentation

## 2011-05-28 DIAGNOSIS — Z5189 Encounter for other specified aftercare: Secondary | ICD-10-CM | POA: Insufficient documentation

## 2011-05-28 DIAGNOSIS — Z954 Presence of other heart-valve replacement: Secondary | ICD-10-CM | POA: Insufficient documentation

## 2011-05-28 DIAGNOSIS — J449 Chronic obstructive pulmonary disease, unspecified: Secondary | ICD-10-CM | POA: Insufficient documentation

## 2011-06-01 ENCOUNTER — Encounter (HOSPITAL_COMMUNITY)
Admission: RE | Admit: 2011-06-01 | Discharge: 2011-06-01 | Disposition: A | Discharge status: Home / Self Care | Payer: Medicare Other | Source: Ambulatory Visit | Attending: Interventional Cardiology | Admitting: Interventional Cardiology

## 2011-06-01 NOTE — Progress Notes (Signed)
1115 - Pt started cardiac rehab today.  Pt tolerated light exercise without difficulty. Monitor showed Sr with st depression, multifocal pvc, rare pac's.  Continue to monitor pt.

## 2011-06-03 ENCOUNTER — Encounter (HOSPITAL_COMMUNITY)
Admission: RE | Admit: 2011-06-03 | Discharge: 2011-06-03 | Disposition: A | Discharge status: Home / Self Care | Payer: Medicare Other | Source: Ambulatory Visit | Attending: Interventional Cardiology | Admitting: Interventional Cardiology

## 2011-06-03 NOTE — Progress Notes (Signed)
[name] 78 y.o. female       Nutrition Screen                                                                    YES  NO Do you live in a nursing home?  X   Do you eat out more than 3 times/week?    X If yes, how many times per week do you eat out?  Do you have food allergies?   X If yes, what are you allergic to?  Have you gained or lost more than 10 lbs without trying?               X If yes, how much weight have you lost and over what time period?  lbs gained or lost over  weeks/month  Do you want to lose weight?     X If yes, what is a goal weight or amount of weight you would like to lose?  Do you eat alone most of the time?  X    Do you eat less than 2 meals/day?  X If yes, how many meals do you eat?  Do you drink more than 3 alcohol drinks/day? X  If yes, how many drinks per day?  Are you having trouble with constipation? *  X If yes, what are you doing to help relieve constipation?  Do you have financial difficulties with buying food?*    X   Are you experiencing regular nausea/ vomiting?*     X   Do you have a poor appetite? *                                        X   Do you have trouble chewing/swallowing? *   X    Pt with diagnoses of:  X COPD X AVR/MVR/ICD      X Dyslipidemia  / HDL< 15 / LDL>70 / High TG      X %  Body fat >goal / Body Mass Index >25 X HTN / BP >120/80 X Pre-diabetes       Pt Risk Score   7       Diagnosis Risk Score  30       Total Risk Score   37                         High Risk               X Low Risk              HT: 64.5" Ht Readings from Last 1 Encounters:  05/28/11 5' 4.5" (1.638 m)    WT:   123.2 lb (56.0 kg) Wt Readings from Last 3 Encounters:  05/28/11 123 lb 7.3 oz (56 kg)  05/13/11 123 lb 0.3 oz (55.8 kg)  05/13/11 123 lb 0.3 oz (55.8 kg)     IBW 55.7 101%IBW BMI 20.9 33.7%body fat   Meds reviewed: Warfarin, Vitamin D, MVI, Iron  Past Medical History  Diagnosis Date  . Hypertension   . Osteopenia   . Hypothyroid    .  Aortic stenosis   . Carotid artery disease   . Hypercalcemia   . Pressure urticaria   . Allergic rhinitis   . RLS (restless legs syndrome)   . COPD (chronic obstructive pulmonary disease)   . Colon polyps   . AS (aortic stenosis) 03/06/2011        Activity level: Pt is sedentary  Wt goal: 123.2 lb ( 56.0 kg) Current tobacco use? No      Food/Drug Interaction? Yes If Y, which med(s)? Warfarin   If yes, pt given Food/Drug Interaction handout? Yes  Labs:  Lipid Panel  No results found for this basename: chol, trig, hdl, cholhdl, vldl, ldlcalc   Lab Results  Component Value Date   HGBA1C 5.7* 03/16/2011   05/13/11 Glucose 104   LDL goal: < 100      MI, DM, Carotid or PVD and > 2:      HTN, >76 yo female   Estimated Daily Nutrition Needs for: ? wt maintenance  1450-1650 Kcal , Total Fat 45-55gm, Saturated Fat 11-13 gm, Trans Fat 1.6-1.8 gm,  Sodium less than 1500 mg

## 2011-06-05 ENCOUNTER — Encounter (HOSPITAL_COMMUNITY)
Admission: RE | Admit: 2011-06-05 | Discharge: 2011-06-05 | Disposition: A | Discharge status: Home / Self Care | Payer: Medicare Other | Source: Ambulatory Visit | Attending: Interventional Cardiology | Admitting: Interventional Cardiology

## 2011-06-08 ENCOUNTER — Encounter (HOSPITAL_COMMUNITY)
Admission: RE | Admit: 2011-06-08 | Discharge: 2011-06-08 | Disposition: A | Discharge status: Home / Self Care | Payer: Medicare Other | Source: Ambulatory Visit | Attending: Interventional Cardiology | Admitting: Interventional Cardiology

## 2011-06-08 NOTE — Progress Notes (Signed)
Reviewed home exercise with pt today.  Pt plans to walk at home for exercise.  Reviewed THR, pulse, RPE, sign and symptoms, and when to call 911 or MD.  Pt voiced understanding. Alberteen Sam, MA, ACSM RCEP

## 2011-06-10 ENCOUNTER — Encounter (HOSPITAL_COMMUNITY)
Admission: RE | Admit: 2011-06-10 | Discharge: 2011-06-10 | Disposition: A | Discharge status: Home / Self Care | Payer: Medicare Other | Source: Ambulatory Visit | Attending: Interventional Cardiology | Admitting: Interventional Cardiology

## 2011-06-12 ENCOUNTER — Encounter (HOSPITAL_COMMUNITY): Payer: Medicare Other

## 2011-06-15 ENCOUNTER — Encounter (HOSPITAL_COMMUNITY)
Admission: RE | Admit: 2011-06-15 | Discharge: 2011-06-15 | Disposition: A | Discharge status: Home / Self Care | Payer: Medicare Other | Source: Ambulatory Visit | Attending: Interventional Cardiology | Admitting: Interventional Cardiology

## 2011-06-15 NOTE — Progress Notes (Signed)
[name] 78 y.o. female Nutrition Note  Spoke with pt.  Nutrition Plan and Nutrition Survey reviewed with pt.  There are many ways pt can improve her diet to become heart healthier.  Pt consumed 23 gm of saturated fat and 0 servings of fruits/veggies on her 24 hour food survey.  Pre-diabetes and consistent vitamin K intake while on coumadin discussed.  Pt expressed understanding of above.  Nutrition Diagnosis   Food-and nutrition-related knowledge deficit related to lack of exposure to information as related to diagnosis of: ? CVD ? Pre-DM (A1c 5.7)   Nutrition RX/ Estimated Daily Nutrition Needs for: wt maintenance 1450-1650 Kcal, 45-55 gm fat, 11-13 gm sat fat, 1.6-1.8 gm trans-fat, <1500 mg sodium Nutrition Intervention   Pt's individual nutrition plan including cholesterol goals reviewed with pt.   Benefits of adopting Therapeutic Lifestyle Changes discussed when Medficts reviewed.   Pt to attend the Portion Distortion class   Pt to attend the  ? Nutrition I class                     ? Nutrition II class    Pt given handouts for: ? Consistent vit K diet   Continue client-centered nutrition education by RD, as part of interdisciplinary care. Goal(s)   Pt to identify and limit food sources of saturated fat, trans fat, and cholesterol   Pt to describe the benefit of including fruits, vegetables, whole grains, and low-fat dairy products in a heart healthy meal plan.   Pt able to name foods rich in vitamin K. (Pt taking Coumadin/Warfarin). Monitor and Evaluate progress toward nutrition goal with team.

## 2011-06-17 ENCOUNTER — Encounter (HOSPITAL_COMMUNITY)
Admission: RE | Admit: 2011-06-17 | Discharge: 2011-06-17 | Disposition: A | Discharge status: Home / Self Care | Payer: Medicare Other | Source: Ambulatory Visit | Attending: Interventional Cardiology | Admitting: Interventional Cardiology

## 2011-06-19 ENCOUNTER — Encounter (HOSPITAL_COMMUNITY)
Admission: RE | Admit: 2011-06-19 | Discharge: 2011-06-19 | Disposition: A | Discharge status: Home / Self Care | Payer: Medicare Other | Source: Ambulatory Visit | Attending: Interventional Cardiology | Admitting: Interventional Cardiology

## 2011-06-19 DIAGNOSIS — D696 Thrombocytopenia, unspecified: Secondary | ICD-10-CM | POA: Insufficient documentation

## 2011-06-19 DIAGNOSIS — I10 Essential (primary) hypertension: Secondary | ICD-10-CM | POA: Insufficient documentation

## 2011-06-19 DIAGNOSIS — J4489 Other specified chronic obstructive pulmonary disease: Secondary | ICD-10-CM | POA: Insufficient documentation

## 2011-06-19 DIAGNOSIS — Z954 Presence of other heart-valve replacement: Secondary | ICD-10-CM | POA: Insufficient documentation

## 2011-06-19 DIAGNOSIS — I359 Nonrheumatic aortic valve disorder, unspecified: Secondary | ICD-10-CM | POA: Insufficient documentation

## 2011-06-19 DIAGNOSIS — I779 Disorder of arteries and arterioles, unspecified: Secondary | ICD-10-CM | POA: Insufficient documentation

## 2011-06-19 DIAGNOSIS — Z5189 Encounter for other specified aftercare: Secondary | ICD-10-CM | POA: Insufficient documentation

## 2011-06-19 DIAGNOSIS — J449 Chronic obstructive pulmonary disease, unspecified: Secondary | ICD-10-CM | POA: Insufficient documentation

## 2011-06-19 DIAGNOSIS — I4891 Unspecified atrial fibrillation: Secondary | ICD-10-CM | POA: Insufficient documentation

## 2011-06-19 DIAGNOSIS — I4892 Unspecified atrial flutter: Secondary | ICD-10-CM | POA: Insufficient documentation

## 2011-06-22 ENCOUNTER — Encounter (HOSPITAL_COMMUNITY)
Admission: RE | Admit: 2011-06-22 | Discharge: 2011-06-22 | Disposition: A | Discharge status: Home / Self Care | Payer: Medicare Other | Source: Ambulatory Visit | Attending: Interventional Cardiology | Admitting: Interventional Cardiology

## 2011-06-24 ENCOUNTER — Encounter (HOSPITAL_COMMUNITY): Payer: Medicare Other

## 2011-06-26 ENCOUNTER — Encounter (HOSPITAL_COMMUNITY)
Admission: RE | Admit: 2011-06-26 | Discharge: 2011-06-26 | Disposition: A | Discharge status: Home / Self Care | Payer: Medicare Other | Source: Ambulatory Visit | Attending: Interventional Cardiology | Admitting: Interventional Cardiology

## 2011-06-29 ENCOUNTER — Encounter (HOSPITAL_COMMUNITY)
Admission: RE | Admit: 2011-06-29 | Discharge: 2011-06-29 | Disposition: A | Discharge status: Home / Self Care | Payer: Medicare Other | Source: Ambulatory Visit | Attending: Interventional Cardiology | Admitting: Interventional Cardiology

## 2011-06-29 ENCOUNTER — Encounter (HOSPITAL_COMMUNITY): Payer: Medicare Other

## 2011-07-01 ENCOUNTER — Encounter (HOSPITAL_COMMUNITY)
Admission: RE | Admit: 2011-07-01 | Discharge: 2011-07-01 | Disposition: A | Discharge status: Home / Self Care | Payer: Medicare Other | Source: Ambulatory Visit | Attending: Interventional Cardiology | Admitting: Interventional Cardiology

## 2011-07-03 ENCOUNTER — Encounter (HOSPITAL_COMMUNITY)
Admission: RE | Admit: 2011-07-03 | Discharge: 2011-07-03 | Disposition: A | Discharge status: Home / Self Care | Payer: Medicare Other | Source: Ambulatory Visit | Attending: Interventional Cardiology | Admitting: Interventional Cardiology

## 2011-07-06 ENCOUNTER — Encounter (HOSPITAL_COMMUNITY)
Admission: RE | Admit: 2011-07-06 | Discharge: 2011-07-06 | Disposition: A | Discharge status: Home / Self Care | Payer: Medicare Other | Source: Ambulatory Visit | Attending: Interventional Cardiology | Admitting: Interventional Cardiology

## 2011-07-08 ENCOUNTER — Encounter (HOSPITAL_COMMUNITY)
Admission: RE | Admit: 2011-07-08 | Discharge: 2011-07-08 | Disposition: A | Discharge status: Home / Self Care | Payer: Medicare Other | Source: Ambulatory Visit | Attending: Interventional Cardiology | Admitting: Interventional Cardiology

## 2011-07-10 ENCOUNTER — Encounter (HOSPITAL_COMMUNITY)
Admission: RE | Admit: 2011-07-10 | Discharge: 2011-07-10 | Disposition: A | Discharge status: Home / Self Care | Payer: Medicare Other | Source: Ambulatory Visit | Attending: Interventional Cardiology | Admitting: Interventional Cardiology

## 2011-07-13 ENCOUNTER — Encounter (HOSPITAL_COMMUNITY)
Admission: RE | Admit: 2011-07-13 | Discharge: 2011-07-13 | Disposition: A | Discharge status: Home / Self Care | Payer: Medicare Other | Source: Ambulatory Visit | Attending: Interventional Cardiology | Admitting: Interventional Cardiology

## 2011-07-13 ENCOUNTER — Ambulatory Visit (INDEPENDENT_AMBULATORY_CARE_PROVIDER_SITE_OTHER): Payer: Medicare Other | Admitting: Thoracic Surgery (Cardiothoracic Vascular Surgery)

## 2011-07-13 ENCOUNTER — Encounter: Payer: Self-pay | Admitting: Thoracic Surgery (Cardiothoracic Vascular Surgery)

## 2011-07-13 DIAGNOSIS — Z09 Encounter for follow-up examination after completed treatment for conditions other than malignant neoplasm: Secondary | ICD-10-CM

## 2011-07-13 DIAGNOSIS — Z954 Presence of other heart-valve replacement: Secondary | ICD-10-CM

## 2011-07-13 NOTE — Progress Notes (Signed)
[address]            [place] [address]          [phone]     CARDIOTHORACIC SURGERY OFFICE NOTE  Referring Provider is Sinclair Grooms, MD PCP is Horton Finer, MD, MD   HPI:  Patient returns for follow-up status post aortic root replacement using a porcine stentless valve conduit for severe aortic stenosis on 03/18/2011. The patient was last seen here in the office in December at which time she remained in atrial flutter. Since then she underwent DC cardioversion by Dr. Tamala Julian. She is maintaining sinus rhythm since then and done quite well. She reports that she is now continuing in the cardiac rehabilitation program and getting along very nicely. Her exercise tolerance is good. She has no complaints.   Current Outpatient Prescriptions  Medication Sig Dispense Refill  . amiodarone (PACERONE) 200 MG tablet Take 200 mg by mouth daily.        . cetirizine (ZYRTEC) 10 MG tablet Take 10 mg by mouth daily.        Marland Kitchen diltiazem (CARDIZEM CD) 180 MG 24 hr capsule 180 mg tablet PO twice a day  60 capsule  11  . Ferrous Sulfate (IRON) 325 (65 FE) MG TABS Take 325 mg by mouth every other day.       . hydrochlorothiazide (HYDRODIURIL) 25 MG tablet Take 1 tablet (25 mg total) by mouth daily.      Marland Kitchen levothyroxine (SYNTHROID, LEVOTHROID) 150 MCG tablet Take 150 mcg by mouth daily.        . Multiple Vitamins-Minerals (MULTIVITAMINS THER. W/MINERALS) TABS Take 1 tablet by mouth daily.        . simvastatin (ZOCOR) 20 MG tablet Take 20 mg by mouth at bedtime.        . vitamin D, CHOLECALCIFEROL, 400 UNITS tablet Take 400 Units by mouth daily.        Marland Kitchen warfarin (COUMADIN) 2.5 MG tablet Take 2.5-5 mg by mouth daily. On Tuesday, Wednesday, Thursday, Saturday, and Sunday, take 1 tablet (2.5 MG). On Mondays and Fridays, take 2 tablets (5 MG)       . budesonide-formoterol (SYMBICORT) 160-4.5 MCG/ACT inhaler Inhale 2 puffs into the lungs 2 (two) times daily.  1  Inhaler  3  . nitroGLYCERIN (NITROSTAT) 0.4 MG SL tablet Place 1 tablet (0.4 mg total) under the tongue every 5 (five) minutes as needed for chest pain.      Marland Kitchen tiotropium (SPIRIVA) 18 MCG inhalation capsule Place 1 capsule (18 mcg total) into inhaler and inhale daily.  30 capsule  5  . DISCONTD: diltiazem (DILT-CD) 120 MG 24 hr capsule Take 1 capsule (120 mg total) by mouth daily.  60 capsule  1      Physical Exam:   BP 120/67  Pulse 80  Resp 16  Ht _0  (1.626 m)  Wt 121 lb (54.885 kg)  BMI 20.77 kg/m2  SpO2 92%  General:  Well-appearing  Chest:   Clear to auscultation. Sternotomy has healed nicely.  CV:   Regular rate and rhythm without murmur  Abdomen:  Soft and nontender  Extremities:  Warm and well-perfused  Diagnostic Tests:  n/a   Impression:  Patient is doing quite well more than 3 months following aortic valve replacement using a stentless porcine aortic root.  Plan:  In the future the patient  will call and return to see [place] as needed. All of her questions been addressed.   [name]. Roxy Manns, MD 07/13/2011 10:28 AM

## 2011-07-15 ENCOUNTER — Encounter (HOSPITAL_COMMUNITY)
Admission: RE | Admit: 2011-07-15 | Discharge: 2011-07-15 | Disposition: A | Discharge status: Home / Self Care | Payer: Medicare Other | Source: Ambulatory Visit | Attending: Interventional Cardiology | Admitting: Interventional Cardiology

## 2011-07-17 ENCOUNTER — Encounter (HOSPITAL_COMMUNITY)
Admission: RE | Admit: 2011-07-17 | Discharge: 2011-07-17 | Disposition: A | Discharge status: Home / Self Care | Payer: Medicare Other | Source: Ambulatory Visit | Attending: Interventional Cardiology | Admitting: Interventional Cardiology

## 2011-07-17 DIAGNOSIS — J4489 Other specified chronic obstructive pulmonary disease: Secondary | ICD-10-CM | POA: Insufficient documentation

## 2011-07-17 DIAGNOSIS — I779 Disorder of arteries and arterioles, unspecified: Secondary | ICD-10-CM | POA: Insufficient documentation

## 2011-07-17 DIAGNOSIS — Z954 Presence of other heart-valve replacement: Secondary | ICD-10-CM | POA: Insufficient documentation

## 2011-07-17 DIAGNOSIS — J449 Chronic obstructive pulmonary disease, unspecified: Secondary | ICD-10-CM | POA: Insufficient documentation

## 2011-07-17 DIAGNOSIS — Z5189 Encounter for other specified aftercare: Secondary | ICD-10-CM | POA: Insufficient documentation

## 2011-07-17 DIAGNOSIS — I4891 Unspecified atrial fibrillation: Secondary | ICD-10-CM | POA: Insufficient documentation

## 2011-07-17 DIAGNOSIS — I10 Essential (primary) hypertension: Secondary | ICD-10-CM | POA: Insufficient documentation

## 2011-07-17 DIAGNOSIS — I4892 Unspecified atrial flutter: Secondary | ICD-10-CM | POA: Insufficient documentation

## 2011-07-17 DIAGNOSIS — I359 Nonrheumatic aortic valve disorder, unspecified: Secondary | ICD-10-CM | POA: Insufficient documentation

## 2011-07-17 DIAGNOSIS — D696 Thrombocytopenia, unspecified: Secondary | ICD-10-CM | POA: Insufficient documentation

## 2011-07-20 ENCOUNTER — Encounter (HOSPITAL_COMMUNITY)
Admission: RE | Admit: 2011-07-20 | Discharge: 2011-07-20 | Disposition: A | Discharge status: Home / Self Care | Payer: Medicare Other | Source: Ambulatory Visit | Attending: Interventional Cardiology | Admitting: Interventional Cardiology

## 2011-07-22 ENCOUNTER — Encounter (HOSPITAL_COMMUNITY)
Admission: RE | Admit: 2011-07-22 | Discharge: 2011-07-22 | Disposition: A | Discharge status: Home / Self Care | Payer: Medicare Other | Source: Ambulatory Visit | Attending: Interventional Cardiology | Admitting: Interventional Cardiology

## 2011-07-24 ENCOUNTER — Encounter (HOSPITAL_COMMUNITY)
Admission: RE | Admit: 2011-07-24 | Discharge: 2011-07-24 | Disposition: A | Discharge status: Home / Self Care | Payer: Medicare Other | Source: Ambulatory Visit | Attending: Interventional Cardiology | Admitting: Interventional Cardiology

## 2011-07-27 ENCOUNTER — Encounter (HOSPITAL_COMMUNITY)
Admission: RE | Admit: 2011-07-27 | Discharge: 2011-07-27 | Disposition: A | Discharge status: Home / Self Care | Payer: Medicare Other | Source: Ambulatory Visit | Attending: Interventional Cardiology | Admitting: Interventional Cardiology

## 2011-07-29 ENCOUNTER — Encounter (HOSPITAL_COMMUNITY)
Admission: RE | Admit: 2011-07-29 | Discharge: 2011-07-29 | Disposition: A | Discharge status: Home / Self Care | Payer: Medicare Other | Source: Ambulatory Visit | Attending: Interventional Cardiology | Admitting: Interventional Cardiology

## 2011-07-31 ENCOUNTER — Encounter (HOSPITAL_COMMUNITY)
Admission: RE | Admit: 2011-07-31 | Discharge: 2011-07-31 | Disposition: A | Discharge status: Home / Self Care | Payer: Medicare Other | Source: Ambulatory Visit | Attending: Interventional Cardiology | Admitting: Interventional Cardiology

## 2011-08-03 ENCOUNTER — Encounter (HOSPITAL_COMMUNITY)
Admission: RE | Admit: 2011-08-03 | Discharge: 2011-08-03 | Disposition: A | Discharge status: Home / Self Care | Payer: Medicare Other | Source: Ambulatory Visit | Attending: Interventional Cardiology | Admitting: Interventional Cardiology

## 2011-08-05 ENCOUNTER — Encounter (HOSPITAL_COMMUNITY)
Admission: RE | Admit: 2011-08-05 | Discharge: 2011-08-05 | Disposition: A | Discharge status: Home / Self Care | Payer: Medicare Other | Source: Ambulatory Visit | Attending: Interventional Cardiology | Admitting: Interventional Cardiology

## 2011-08-07 ENCOUNTER — Encounter (HOSPITAL_COMMUNITY)
Admission: RE | Admit: 2011-08-07 | Discharge: 2011-08-07 | Disposition: A | Discharge status: Home / Self Care | Payer: Medicare Other | Source: Ambulatory Visit | Attending: Interventional Cardiology | Admitting: Interventional Cardiology

## 2011-08-10 ENCOUNTER — Encounter (HOSPITAL_COMMUNITY)
Admission: RE | Admit: 2011-08-10 | Discharge: 2011-08-10 | Disposition: A | Discharge status: Home / Self Care | Payer: Medicare Other | Source: Ambulatory Visit | Attending: Interventional Cardiology | Admitting: Interventional Cardiology

## 2011-08-12 ENCOUNTER — Encounter (HOSPITAL_COMMUNITY)
Admission: RE | Admit: 2011-08-12 | Discharge: 2011-08-12 | Disposition: A | Discharge status: Home / Self Care | Payer: Medicare Other | Source: Ambulatory Visit | Attending: Interventional Cardiology | Admitting: Interventional Cardiology

## 2011-08-14 ENCOUNTER — Encounter (HOSPITAL_COMMUNITY)
Admission: RE | Admit: 2011-08-14 | Discharge: 2011-08-14 | Disposition: A | Discharge status: Home / Self Care | Payer: Medicare Other | Source: Ambulatory Visit | Attending: Interventional Cardiology | Admitting: Interventional Cardiology

## 2011-08-17 ENCOUNTER — Encounter (HOSPITAL_COMMUNITY)
Admission: RE | Admit: 2011-08-17 | Discharge: 2011-08-17 | Disposition: A | Discharge status: Home / Self Care | Payer: Medicare Other | Source: Ambulatory Visit | Attending: Interventional Cardiology | Admitting: Interventional Cardiology

## 2011-08-17 DIAGNOSIS — I779 Disorder of arteries and arterioles, unspecified: Secondary | ICD-10-CM | POA: Insufficient documentation

## 2011-08-17 DIAGNOSIS — Z5189 Encounter for other specified aftercare: Secondary | ICD-10-CM | POA: Insufficient documentation

## 2011-08-17 DIAGNOSIS — I359 Nonrheumatic aortic valve disorder, unspecified: Secondary | ICD-10-CM | POA: Insufficient documentation

## 2011-08-17 DIAGNOSIS — I4892 Unspecified atrial flutter: Secondary | ICD-10-CM | POA: Insufficient documentation

## 2011-08-17 DIAGNOSIS — I4891 Unspecified atrial fibrillation: Secondary | ICD-10-CM | POA: Insufficient documentation

## 2011-08-17 DIAGNOSIS — I10 Essential (primary) hypertension: Secondary | ICD-10-CM | POA: Insufficient documentation

## 2011-08-17 DIAGNOSIS — J4489 Other specified chronic obstructive pulmonary disease: Secondary | ICD-10-CM | POA: Insufficient documentation

## 2011-08-17 DIAGNOSIS — J449 Chronic obstructive pulmonary disease, unspecified: Secondary | ICD-10-CM | POA: Insufficient documentation

## 2011-08-17 DIAGNOSIS — D696 Thrombocytopenia, unspecified: Secondary | ICD-10-CM | POA: Insufficient documentation

## 2011-08-17 DIAGNOSIS — Z954 Presence of other heart-valve replacement: Secondary | ICD-10-CM | POA: Insufficient documentation

## 2011-08-19 ENCOUNTER — Encounter (HOSPITAL_COMMUNITY)
Admission: RE | Admit: 2011-08-19 | Discharge: 2011-08-19 | Disposition: A | Discharge status: Home / Self Care | Payer: Medicare Other | Source: Ambulatory Visit | Attending: Interventional Cardiology | Admitting: Interventional Cardiology

## 2011-08-21 ENCOUNTER — Encounter (HOSPITAL_COMMUNITY): Payer: Medicare Other

## 2011-08-24 ENCOUNTER — Encounter (HOSPITAL_COMMUNITY)
Admission: RE | Admit: 2011-08-24 | Discharge: 2011-08-24 | Disposition: A | Discharge status: Home / Self Care | Payer: Medicare Other | Source: Ambulatory Visit | Attending: Interventional Cardiology | Admitting: Interventional Cardiology

## 2011-08-26 ENCOUNTER — Encounter (HOSPITAL_COMMUNITY)
Admission: RE | Admit: 2011-08-26 | Discharge: 2011-08-26 | Disposition: A | Discharge status: Home / Self Care | Payer: Medicare Other | Source: Ambulatory Visit | Attending: Interventional Cardiology | Admitting: Interventional Cardiology

## 2011-08-28 ENCOUNTER — Encounter (HOSPITAL_COMMUNITY)
Admission: RE | Admit: 2011-08-28 | Discharge: 2011-08-28 | Disposition: A | Discharge status: Home / Self Care | Payer: Medicare Other | Source: Ambulatory Visit | Attending: Interventional Cardiology | Admitting: Interventional Cardiology

## 2011-08-31 ENCOUNTER — Encounter (HOSPITAL_COMMUNITY): Payer: Medicare Other

## 2011-09-02 ENCOUNTER — Encounter (HOSPITAL_COMMUNITY): Payer: Medicare Other

## 2011-09-04 ENCOUNTER — Encounter (HOSPITAL_COMMUNITY): Payer: Medicare Other

## 2011-10-13 ENCOUNTER — Ambulatory Visit (INDEPENDENT_AMBULATORY_CARE_PROVIDER_SITE_OTHER): Payer: Medicare Other | Admitting: Pulmonary Disease

## 2011-10-13 ENCOUNTER — Encounter: Payer: Self-pay | Admitting: Pulmonary Disease

## 2011-10-13 VITALS — BP 114/60 | HR 72 | Temp 97.6°F | Ht 64.0 in | Wt 123.2 lb

## 2011-10-13 DIAGNOSIS — J449 Chronic obstructive pulmonary disease, unspecified: Secondary | ICD-10-CM

## 2011-10-13 NOTE — Assessment & Plan Note (Signed)
The patient is doing well from a pulmonary standpoint on Spiriva alone.  I would prefer her to be on Spiriva and symbicort, but she has issues with the expense of her medications.  She is no longer requiring oxygen, and is staying very active.  She has not had a recent acute exacerbation or chest infection.

## 2011-10-13 NOTE — Progress Notes (Signed)
  Subjective:    Patient ID: [name], female    DOB: 1929/12/05, 78 y.o.   MRN: [id number]  HPI The patient comes in today for followup of her known moderate to severe COPD.  She has discontinued symbicort and maintained on Spiriva alone because of expense, and feels that she is doing very well with this.  She would like to stop the Spiriva as well, but I have strongly encouraged her to continue in light of her significant obstructive lung disease.  She feels that her exertional tolerance is excellent, and is staying very active.  She denies any significant cough or mucus.   Review of Systems  Constitutional: Negative.  Negative for fever and unexpected weight change.  HENT: Negative.  Negative for ear pain, nosebleeds, congestion, sore throat, rhinorrhea, sneezing, trouble swallowing, dental problem, postnasal drip and sinus pressure.   Eyes: Negative.  Negative for redness and itching.  Respiratory: Negative.  Negative for cough, chest tightness, shortness of breath and wheezing.   Cardiovascular: Negative.  Negative for palpitations and leg swelling.  Gastrointestinal: Negative.  Negative for nausea and vomiting.  Genitourinary: Negative.  Negative for dysuria.  Musculoskeletal: Negative.  Negative for joint swelling.  Skin: Negative.  Negative for rash.  Neurological: Negative.  Negative for headaches.  Hematological: Negative.  Does not bruise/bleed easily.  Psychiatric/Behavioral: Negative.  Negative for dysphoric mood. The patient is not nervous/anxious.        Objective:   Physical Exam Thin female in no acute distress Nose without purulence or discharge noted Chest with mild decrease in breath sounds, otherwise totally clear.  No wheezing or rhonchi Cardiac exam with regular rate and rhythm Lower extremities with very minimal edema, no cyanosis Alert and oriented, moves all 4 extremities.       Assessment & Plan:

## 2011-10-13 NOTE — Patient Instructions (Signed)
Stay on spiriva daily Stay as active as possible. If doing well, followup with me in 29mo.

## 2011-10-14 ENCOUNTER — Emergency Department (INDEPENDENT_AMBULATORY_CARE_PROVIDER_SITE_OTHER)
Admission: EM | Admit: 2011-10-14 | Discharge: 2011-10-14 | Disposition: A | Discharge status: Home Health | Payer: Medicare Other | Source: Home / Self Care | Attending: Emergency Medicine | Admitting: Emergency Medicine

## 2011-10-14 ENCOUNTER — Encounter (HOSPITAL_COMMUNITY): Payer: Self-pay

## 2011-10-14 DIAGNOSIS — M899 Disorder of bone, unspecified: Secondary | ICD-10-CM

## 2011-10-14 DIAGNOSIS — M898X8 Other specified disorders of bone, other site: Secondary | ICD-10-CM

## 2011-10-14 LAB — POCT URINALYSIS DIP (DEVICE)
Glucose, UA: NEGATIVE mg/dL
Hgb urine dipstick: NEGATIVE
Nitrite: NEGATIVE
Protein, ur: NEGATIVE mg/dL
Urobilinogen, UA: 0.2 mg/dL (ref 0.0–1.0)

## 2011-10-14 MED ORDER — HYDROCODONE-ACETAMINOPHEN 5-500 MG PO TABS: 1.0000 | ORAL_TABLET | Freq: Four times a day (QID) | ORAL | Status: AC | PRN

## 2011-10-14 NOTE — ED Provider Notes (Signed)
History     CSN: [id number]  Arrival date & time 10/14/11  64   First MD Initiated Contact with Patient 10/14/11 1800      Chief Complaint  Patient presents with  . Flank Pain    (Consider location/radiation/quality/duration/timing/severity/associated sxs/prior treatment) HPI Comments: Was working on a house project, at one point i stood up, and felt this sudden pain on my left lower back", it hurts right here (see illustration), it hurts the most when I bend over and move"  "Do you think is a kidney stone", patient denies any urinary symptoms, no increased frequency and no blood in urine. No fevers, no abdominal pain, no nausea or vomitting  Patient is a 78 y.o. female presenting with flank pain. The history is provided by the patient.  Flank Pain This is a new problem. The current episode started more than 2 days ago. The problem occurs constantly. The problem has not changed since onset.Pertinent negatives include no abdominal pain and no shortness of breath. The symptoms are aggravated by bending. The symptoms are relieved by nothing.    Past Medical History  Diagnosis Date  . Hypertension   . Osteopenia   . Hypothyroid   . Aortic stenosis   . Carotid artery disease   . Hypercalcemia   . Pressure urticaria   . Allergic rhinitis   . RLS (restless legs syndrome)   . COPD (chronic obstructive pulmonary disease)   . Colon polyps   . AS (aortic stenosis) 03/06/2011  . S/P aortic valve replacement with stentless valve 03/18/2011    Past Surgical History  Procedure Date  . Breast lumpectomy     Left  . Foot surgery     Morton's neuroma  . Knee arthroscopy     Left  . Cholecystectomy   . Cataract extraction   . Aortic root replacement 03/18/2011    29m Medtronic Freestyle Porcine aortic root with reimplantation of coronary arteries  . Cardioversion 05/22/2011    Procedure: CARDIOVERSION;  Surgeon: HSinclair Grooms MD;  [facility];  Service: Cardiovascular;   Laterality: N/A;    Family History  Problem Relation Age of Onset  . Emphysema Maternal Aunt   . Asthma Maternal Aunt   . Asthma Maternal Uncle   . Heart disease Maternal Grandmother   . Cancer Mother     bladder  . Lung cancer Father   . Breast cancer Sister   . Uterine cancer Sister     History  Substance Use Topics  . Smoking status: Former Smoker -- 0.9 packs/day for 62 years    Types: Cigarettes    Quit date: 07/14/2010  . Smokeless tobacco: Not on file  . Alcohol Use: No    OB History    Grav Para Term Preterm Abortions TAB SAB Ect Mult Living                  Review of Systems  Constitutional: Negative for fever, chills and fatigue.  Respiratory: Negative for shortness of breath.   Gastrointestinal: Negative for abdominal pain.  Genitourinary: Negative for dysuria, urgency, frequency, flank pain, decreased urine volume, vaginal bleeding, vaginal discharge and dyspareunia.  Musculoskeletal: Positive for back pain.    Allergies  Clarithromycin and Penicillins  Home Medications   Current Outpatient Rx  Name Route Sig Dispense Refill  . CETIRIZINE HCL 10 MG PO TABS Oral Take 10 mg by mouth daily.      . IRON 325 (65 FE) MG PO TABS  Oral Take 325 mg by mouth every other day.     Marland Kitchen HYDROCHLOROTHIAZIDE 25 MG PO TABS Oral Take 1 tablet (25 mg total) by mouth daily.    Marland Kitchen HYDROCODONE-ACETAMINOPHEN 5-500 MG PO TABS Oral Take 1-2 tablets by mouth every 6 (six) hours as needed for pain. 15 tablet 0  . LEVOTHYROXINE SODIUM 125 MCG PO TABS Oral Take 125 mcg by mouth daily.    [employer] M PLUS PO TABS Oral Take 1 tablet by mouth daily.      Marland Kitchen SIMVASTATIN 20 MG PO TABS Oral Take 20 mg by mouth at bedtime.      Marland Kitchen TIOTROPIUM BROMIDE MONOHYDRATE 18 MCG IN CAPS Inhalation Place 1 capsule (18 mcg total) into inhaler and inhale daily. 30 capsule 5  . VITAMIN D 400 UNITS PO TABS Oral Take 400 Units by mouth daily.        BP 126/66  Pulse 71  Temp(Src) 98.5 F (36.9 C) (Oral)   Resp 18  SpO2 95%  Physical Exam  Nursing note and vitals reviewed. Constitutional: She appears well-developed and well-nourished.  Non-toxic appearance. She does not have a sickly appearance. She does not appear ill. No distress.    Abdominal: Soft. She exhibits no distension and no mass. There is no tenderness. There is no rebound and no guarding.  Musculoskeletal: She exhibits tenderness.  Neurological: She is alert.  Skin: No rash noted. No erythema.    ED Course  Procedures (including critical care time)  Labs Reviewed  POCT URINALYSIS DIP (DEVICE) - Abnormal; Notable for the following:    Leukocytes, UA SMALL (*) Biochemical Testing Only. Please order routine urinalysis from main lab if confirmatory testing is needed.   All other components within normal limits   No results found.   1. Iliac crest bone pain       MDM  Patient with R iliac selective tenderness post/yard work.        Rosana Hoes, MD 10/14/11 2216

## 2011-10-14 NOTE — ED Notes (Addendum)
Pt c/o L flank pain onset yesterday.  Pt states she was DX with kidney stones years ago but has never passed one.  Pt denies any urinary SX.  Pt states pain increases with movement.  Pts pain appears to be more in L buttock upon assessment than L flank.

## 2011-10-14 NOTE — Discharge Instructions (Signed)
Be. precautions with this medicine as it can make you feel drowsy or dizzy. Take extra precautions     Musculoskeletal Pain Musculoskeletal pain is muscle and boney aches and pains. These pains can occur in any part of the body. Your caregiver may treat you without knowing the cause of the pain. They may treat you if blood or urine tests, X-rays, and other tests were normal.  CAUSES There is often not a definite cause or reason for these pains. These pains may be caused by a type of germ (virus). The discomfort may also come from overuse. Overuse includes working out too hard when your body is not fit. Boney aches also come from weather changes. Bone is sensitive to atmospheric pressure changes. HOME CARE INSTRUCTIONS   Ask when your test results will be ready. Make sure you get your test results.   Only take over-the-counter or prescription medicines for pain, discomfort, or fever as directed by your caregiver. If you were given medications for your condition, do not drive, operate machinery or power tools, or sign legal documents for 24 hours. Do not drink alcohol. Do not take sleeping pills or other medications that may interfere with treatment.   Continue all activities unless the activities cause more pain. When the pain lessens, slowly resume normal activities. Gradually increase the intensity and duration of the activities or exercise.   During periods of severe pain, bed rest may be helpful. Lay or sit in any position that is comfortable.   Putting ice on the injured area.   Put ice in a bag.   Place a towel between your skin and the bag.   Leave the ice on for 15 to 20 minutes, 3 to 4 times a day.   Follow up with your caregiver for continued problems and no reason can be found for the pain. If the pain becomes worse or does not go away, it may be necessary to repeat tests or do additional testing. Your caregiver may need to look further for a possible cause.  SEEK IMMEDIATE  MEDICAL CARE IF:  You have pain that is getting worse and is not relieved by medications.   You develop chest pain that is associated with shortness or breath, sweating, feeling sick to your stomach (nauseous), or throw up (vomit).   Your pain becomes localized to the abdomen.   You develop any new symptoms that seem different or that concern you.  MAKE SURE YOU:   Understand these instructions.   Will watch your condition.   Will get help right away if you are not doing well or get worse.  Document Released: 05/04/2005 Document Revised: 04/23/2011 Document Reviewed: 12/23/2007 [facility] Patient Information 2012 [place].

## 2011-10-22 NOTE — Progress Notes (Signed)
Cardiac Rehab Program outcome report :  Agree with the assessment.  Pt had zero hospitalizations during her participation. Medication list reconciled.

## 2011-12-22 ENCOUNTER — Other Ambulatory Visit: Payer: Self-pay | Admitting: Pulmonary Disease

## 2012-02-02 ENCOUNTER — Other Ambulatory Visit: Payer: Self-pay | Admitting: Gastroenterology

## 2012-04-18 ENCOUNTER — Encounter: Payer: Self-pay | Admitting: Pulmonary Disease

## 2012-04-18 ENCOUNTER — Ambulatory Visit (INDEPENDENT_AMBULATORY_CARE_PROVIDER_SITE_OTHER): Payer: Medicare Other | Admitting: Pulmonary Disease

## 2012-04-18 VITALS — BP 138/72 | HR 80 | Temp 97.1°F | Ht 64.5 in | Wt 125.2 lb

## 2012-04-18 DIAGNOSIS — J449 Chronic obstructive pulmonary disease, unspecified: Secondary | ICD-10-CM

## 2012-04-18 MED ORDER — ALBUTEROL SULFATE HFA 108 (90 BASE) MCG/ACT IN AERS: 2.0000 | INHALATION_SPRAY | Freq: Four times a day (QID) | RESPIRATORY_TRACT | Status: DC | PRN

## 2012-04-18 MED ORDER — ACLIDINIUM BROMIDE 400 MCG/ACT IN AEPB: 1.0000 | INHALATION_SPRAY | Freq: Two times a day (BID) | RESPIRATORY_TRACT | Status: DC

## 2012-04-18 NOTE — Patient Instructions (Addendum)
Will change you to [place] for insurance purposes, and take it 1 puff in am and pm each day.  In the meantime, see if symbicort 160/4.5 or dulera 100/5 are on a better tier (less expensive) for you.  Let us know what you find out.  Keep albuterol handy for emergencies, and can take 2 puffs every 6 hrs only if needed for rescue.  followup with me in 76mo

## 2012-04-18 NOTE — Progress Notes (Signed)
  Subjective:    Patient ID: [name], female    DOB: 02/01/1930, 78 y.o.   MRN: [id number]  HPI The patient comes in today for followup of her known COPD.  She's been doing well on Spiriva alone since the last visit, with no acute exacerbation or pulmonary infection.  She feels that her exertional tolerance is stable.  Unfortunately, insurance is not going to cover her Spiriva any further, and has listed tudorza as an alternative.  However, it is on tier 4.    Review of Systems  Constitutional: Negative for fever and unexpected weight change.  HENT: Positive for congestion. Negative for ear pain, nosebleeds, sore throat, rhinorrhea, sneezing, trouble swallowing, dental problem, postnasal drip and sinus pressure.   Eyes: Negative for redness and itching.  Respiratory: Positive for cough, shortness of breath ( upon activity ) and wheezing. Negative for chest tightness. Stridor: upon activity    Cardiovascular: Positive for leg swelling. Negative for palpitations.  Gastrointestinal: Negative for nausea and vomiting.  Genitourinary: Negative for dysuria.  Musculoskeletal: Positive for joint swelling.  Skin: Negative for rash.  Neurological: Negative for headaches.  Hematological: Does not bruise/bleed easily.  Psychiatric/Behavioral: Negative for dysphoric mood. The patient is not nervous/anxious.        Objective:   Physical Exam Thin female in no acute distress Nose without purulence or discharge noted Neck without lymphadenopathy or thyromegaly Chest with decreased breath sounds, no wheezes or rhonchi Cardiac exam with regular rate and rhythm Lower extremities minimal ankle edema, no cyanosis alert and oriented, moves all 4 extremities       Assessment & Plan:

## 2012-04-18 NOTE — Addendum Note (Signed)
Addended by: Danella Maiers on: 04/18/2012 03:32 PM   Modules accepted: Orders

## 2012-04-18 NOTE — Assessment & Plan Note (Signed)
The patient has done well on Spiriva alone, and has had an adequate exertional tolerance and no acute exacerbation.  Unfortunately, we want to change this because of her insurance coverage.  We'll start her on tudorza, but also given her a list of other medications to check and see if they are less expensive.  I've also encouraged her to continue staying as active as possible.

## 2012-06-21 ENCOUNTER — Telehealth: Payer: Self-pay | Admitting: Pulmonary Disease

## 2012-06-21 MED ORDER — ACLIDINIUM BROMIDE 400 MCG/ACT IN AEPB: 1.0000 | INHALATION_SPRAY | Freq: Two times a day (BID) | RESPIRATORY_TRACT | Status: DC

## 2012-06-21 NOTE — Telephone Encounter (Signed)
Pt is aware rx for tudorza has been sent. Nothing further was needed

## 2012-10-16 ENCOUNTER — Other Ambulatory Visit: Payer: Self-pay | Admitting: Pulmonary Disease

## 2012-10-17 ENCOUNTER — Encounter: Payer: Self-pay | Admitting: Pulmonary Disease

## 2012-10-17 ENCOUNTER — Ambulatory Visit (INDEPENDENT_AMBULATORY_CARE_PROVIDER_SITE_OTHER): Payer: Medicare Other | Admitting: Pulmonary Disease

## 2012-10-17 VITALS — BP 118/60 | HR 64 | Temp 98.0°F | Ht 64.0 in | Wt 126.4 lb

## 2012-10-17 DIAGNOSIS — J449 Chronic obstructive pulmonary disease, unspecified: Secondary | ICD-10-CM

## 2012-10-17 NOTE — Patient Instructions (Addendum)
Stop tudorza since it is not helping you as much as would have hoped. Will give you samples of Anoro to take, one inhalation each am.  Rinse mouth well Check with your insurance to see if anoro is covered.  Let us know and we can send in a prescription.  Other alternatives would be advair, symbicort, dulera. Stay as active as possible Use albuterol for rescue if needed.  followup with me in 31mo.

## 2012-10-17 NOTE — Progress Notes (Signed)
  Subjective:    Patient ID: [name], female    DOB: [birth year], 79 y.o.   MRN: [id number]  HPI The patient comes in today for followup of her known COPD.  She has been maintained on tudorza since her insurance did not cover Spiriva, but she does not feel that she is doing as well with this medication.  She also feels this leaves a bad taste in her mouth.  She is trying to stay as active as possible, and has dyspnea on exertion at her usual baseline.  She denies any significant cough or congestion, and has not had an acute exacerbation since her last visit.   Review of Systems  Constitutional: Negative for fever and unexpected weight change.  HENT: Negative for ear pain, nosebleeds, congestion, sore throat, rhinorrhea, sneezing, trouble swallowing, dental problem, postnasal drip and sinus pressure.   Eyes: Positive for redness and itching.  Respiratory: Positive for cough and shortness of breath. Negative for chest tightness and wheezing.   Cardiovascular: Positive for leg swelling. Negative for palpitations.  Gastrointestinal: Negative for nausea and vomiting.  Genitourinary: Negative for dysuria.  Musculoskeletal: Negative for joint swelling.  Skin: Negative for rash.  Neurological: Negative for headaches.  Hematological: Does not bruise/bleed easily.  Psychiatric/Behavioral: Negative for dysphoric mood. The patient is not nervous/anxious.        Objective:   Physical Exam Thin female in no acute distress Nose without purulence or discharge noted Neck without lymphadenopathy or thyromegaly Chest with decreased breath sounds, no wheezes Cardiac exam is regular rate and rhythm Lower extremities without edema, no cyanosis Alert and oriented, moves all 4 extremities.       Assessment & Plan:

## 2012-10-17 NOTE — Assessment & Plan Note (Signed)
The patient feels that she is at her usual baseline currently, but is not satisfied with her response to the medications.  I will give her samples of anoro to try, and she will check with her insurance company to see if it is covered.  If it is not, she can consider other LABA/ICS.  She is to let me know what she finds out.  In the meantime, I have asked her to stay as active as possible, and to consider pulmonary rehabilitation.

## 2012-11-21 ENCOUNTER — Telehealth: Payer: Self-pay | Admitting: Pulmonary Disease

## 2012-11-21 MED ORDER — TIOTROPIUM BROMIDE MONOHYDRATE 18 MCG IN CAPS: 18.0000 ug | ORAL_CAPSULE | Freq: Every day | RESPIRATORY_TRACT | Status: DC

## 2012-11-21 NOTE — Telephone Encounter (Signed)
Spoke with pt and notified of recs per [facility] She states prefers to try the spiriva again Rx was sent to pharm  Nothing further needed per pt

## 2012-11-21 NOTE — Telephone Encounter (Signed)
ATC patient x 2 [employer]

## 2012-11-21 NOTE — Telephone Encounter (Signed)
ATC patient, no answer [id number]

## 2012-11-21 NOTE — Telephone Encounter (Signed)
Pt returned call. Mariann Laster

## 2012-11-21 NOTE — Telephone Encounter (Signed)
If I remember correctly, she didn't have a great response to spiriva either.   See if she is willing to try symbicort 160/4.5  2 inhalations am and pm.  Rinse mouth well. If she thinks she did well with spiriva, can try that again first, and then symbicort if she doesn't improve.

## 2012-11-21 NOTE — Telephone Encounter (Signed)
Pt reports that the Anoro is not covered by her insurance but in the mean time she called her insurance company and they will cover Spiriva.  Pt was on this last year but had to stop because insurance said it would not be covered.  Since they say that it will now be covered please advise if ok to send in rx for spiriva.

## 2013-04-18 ENCOUNTER — Ambulatory Visit: Payer: Medicare Other | Admitting: Pulmonary Disease

## 2013-04-19 ENCOUNTER — Encounter: Payer: Self-pay | Admitting: Pulmonary Disease

## 2013-04-19 ENCOUNTER — Ambulatory Visit (INDEPENDENT_AMBULATORY_CARE_PROVIDER_SITE_OTHER): Payer: Medicare Other | Admitting: Pulmonary Disease

## 2013-04-19 VITALS — BP 110/60 | HR 76 | Temp 97.5°F | Ht 64.0 in | Wt 126.8 lb

## 2013-04-19 DIAGNOSIS — J449 Chronic obstructive pulmonary disease, unspecified: Secondary | ICD-10-CM

## 2013-04-19 NOTE — Progress Notes (Signed)
Subjective:    Patient ID: [name], female    DOB: 02-23-1930, 79 y.o.   MRN: [id number]  HPI The patient comes in today for followup of her known COPD. She has decided to stay on Spiriva, but is willing to consider coming off the medication. I have explained to her that maintenance medication is usually prescribed to improve daily symptoms, and also to prevent acute exacerbation. The patient has not had issues with either of these. She feels her exertional tolerance is at baseline, and rarely uses her rescue inhaler.   Review of Systems  Constitutional: Negative for fever and unexpected weight change.  HENT: Negative for congestion, dental problem, ear pain, nosebleeds, postnasal drip, rhinorrhea, sinus pressure, sneezing, sore throat and trouble swallowing.   Eyes: Negative for redness and itching.  Respiratory: Positive for shortness of breath. Negative for cough, chest tightness and wheezing.   Cardiovascular: Negative for palpitations and leg swelling.  Gastrointestinal: Negative for nausea and vomiting.  Genitourinary: Negative for dysuria.  Musculoskeletal: Negative for joint swelling.  Skin: Negative for rash.  Neurological: Negative for headaches.  Hematological: Does not bruise/bleed easily.  Psychiatric/Behavioral: Negative for dysphoric mood. The patient is not nervous/anxious.        Objective:   Physical Exam Thin female in no acute distress Nose without purulence or discharge noted Neck without lymphadenopathy or thyromegaly Chest with mildly decreased breath sounds, no wheezing Cardiac exam with regular rate and rhythm Lower extremities without edema, no cyanosis Alert and oriented, moves all 4 extremities.       Assessment & Plan:

## 2013-04-19 NOTE — Assessment & Plan Note (Signed)
Despite having severe COPD, the patient has done very well from a pulmonary standpoint. She would like to come off the Spiriva, and she has not had increased symptoms for an acute exacerbation. I am okay with this, provided she gets back on the medication if she has difficulties. I have also encouraged her to stay as active as possible.

## 2013-04-19 NOTE — Patient Instructions (Signed)
Ok with me to come off spiriva to see how you do.  If you have increased shortness of breath, or a flare up of your lung disease, you will need to get back on medication. Keep rescue inhaler available if needed. followup with me in one year since you are doing so well.

## 2013-08-02 ENCOUNTER — Telehealth: Payer: Self-pay | Admitting: Pulmonary Disease

## 2013-08-02 NOTE — Telephone Encounter (Signed)
Called spoke with pt. She reports she had stopped her spiriva after her last OV bc she was doing better in december. She just restarted the spiriva 2 weeks d/t SOB came back. She feels better now since being on spiriva and will continue to take this. FYI for [place].

## 2013-08-07 ENCOUNTER — Other Ambulatory Visit: Payer: Self-pay | Admitting: Pulmonary Disease

## 2014-01-09 ENCOUNTER — Other Ambulatory Visit: Payer: Self-pay

## 2014-01-09 MED ORDER — AMLODIPINE BESYLATE 10 MG PO TABS: 10.0000 mg | ORAL_TABLET | Freq: Every day | ORAL | Status: DC

## 2014-02-06 ENCOUNTER — Ambulatory Visit (INDEPENDENT_AMBULATORY_CARE_PROVIDER_SITE_OTHER): Payer: Medicare Other | Admitting: Interventional Cardiology

## 2014-02-06 ENCOUNTER — Encounter: Payer: Self-pay | Admitting: Interventional Cardiology

## 2014-02-06 VITALS — BP 128/68 | HR 75 | Ht 64.0 in | Wt 123.4 lb

## 2014-02-06 DIAGNOSIS — I4891 Unspecified atrial fibrillation: Secondary | ICD-10-CM

## 2014-02-06 DIAGNOSIS — I480 Paroxysmal atrial fibrillation: Secondary | ICD-10-CM

## 2014-02-06 DIAGNOSIS — Z952 Presence of prosthetic heart valve: Secondary | ICD-10-CM

## 2014-02-06 DIAGNOSIS — J438 Other emphysema: Secondary | ICD-10-CM

## 2014-02-06 DIAGNOSIS — I10 Essential (primary) hypertension: Secondary | ICD-10-CM

## 2014-02-06 DIAGNOSIS — J432 Centrilobular emphysema: Secondary | ICD-10-CM

## 2014-02-06 DIAGNOSIS — Z954 Presence of other heart-valve replacement: Secondary | ICD-10-CM

## 2014-02-06 NOTE — Patient Instructions (Signed)
Your physician recommends that you continue on your current medications as directed. Please refer to the Current Medication list given to you today.  Your physician wants you to follow-up in: 1 year with Dr.Smith You will receive a reminder letter in the mail two months in advance. If you don't receive a letter, please call our office to schedule the follow-up appointment.

## 2014-02-06 NOTE — Progress Notes (Signed)
Patient ID: [name], female   DOB: [birth year], 80 y.o.   MRN: [id number] Past Medical History  Hypertension   osteopenia (bisphosphonates 1999-2009; stable BMD 2011; slightly worse in 2013 but T score -1.1; recheck planned 2015)   Hypothyroidism   aortic stenosis, severe by Echo 2011 with peak AV velocity > 4 m/sec; replaced 02/2011   COPD - Dr. Gwenette Greet; now stopped smoking   Colon polyp (1987; 10/02 hyperplastic polyp; 10/07 hyperplastic;10/13 adenoma) - no repeat recommended   carotid artery disease - 2005 swooshing sound in ear - dopplers severe left ECA and mild bilat ICA stenoses   hypercalcemia with N/V and dehydration (? etiology, 2008)   Pressure urticaria   Asx kidney stones   Allergic rhinitis   possible PUD (2009; abd pain with abnl UGI, pos H pylori Ab, rx'ed with PPI and Biaxin)   Rib fx and sprained hip (from fall in 6/10)   RLS improved with iron   ophth - Dr. Marylene Land, Dr. Katy Fitch    post AVR Afib with RVR, resolved, 02/2011   LE edema possibly related to amlodipine      [address], [place]  [address] Phone: [phone] Fax:  [phone]  Date:  02/06/2014   ID:  [name], DOB November 07, 1929, MRN [id number]  PCP:  Horton Finer, MD   ASSESSMENT:  1. Stentless bioprosthetic aortic valve for aortic stenosis, asymptomatic 2. History of paroxysmal atrial fibrillation postop. No recurrence since surgery. 3. Hypertension under control 4. COPD, limiting exertional activity  PLAN:  1. Clinical followup in one year, status post aortic valve replacement 2012 2. Encouraged to physical activity 3. Encouraged notification if palpitations   SUBJECTIVE: [name] is a 80 y.o. female who is doing well. She is limited by dyspnea on exertion related to COPD. She has not had syncope, angina, orthopnea, or edema. She denies palpitations.   Wt Readings from Last 3 Encounters:  02/06/14 123 lb 6.4 oz (55.974 kg)    04/19/13 126 lb 12.8 oz (57.516 kg)  10/17/12 126 lb 6.4 oz (57.335 kg)     Past Medical History  Diagnosis Date  . Hypertension   . Osteopenia   . Hypothyroid   . Aortic stenosis   . Carotid artery disease   . Hypercalcemia   . Pressure urticaria   . Allergic rhinitis   . RLS (restless legs syndrome)   . COPD (chronic obstructive pulmonary disease)   . Colon polyps   . AS (aortic stenosis) 03/06/2011  . S/P aortic valve replacement with stentless valve 03/18/2011    Current Outpatient Prescriptions  Medication Sig Dispense Refill  . albuterol (PROVENTIL HFA;VENTOLIN HFA) 108 (90 BASE) MCG/ACT inhaler Inhale 2 puffs into the lungs every 6 (six) hours as needed for wheezing.  1 Inhaler  0  . amLODipine (NORVASC) 10 MG tablet Take 1 tablet (10 mg total) by mouth daily.  30 tablet  1  . aspirin 81 MG tablet Take 81 mg by mouth daily.      . cetirizine (ZYRTEC) 10 MG tablet Take 10 mg by mouth daily.        . Ferrous Sulfate (IRON) 325 (65 FE) MG TABS Take 325 mg by mouth every other day.       . hydrochlorothiazide (HYDRODIURIL) 25 MG tablet Take 1 tablet (25 mg total) by mouth daily.      Marland Kitchen levothyroxine (SYNTHROID, LEVOTHROID) 125 MCG tablet Take 125 mcg by mouth daily.      Marland Kitchen  Multiple Vitamins-Minerals (MULTIVITAMINS THER. W/MINERALS) TABS Take 1 tablet by mouth daily.        . simvastatin (ZOCOR) 20 MG tablet Take 20 mg by mouth at bedtime.        Marland Kitchen SPIRIVA HANDIHALER 18 MCG inhalation capsule PLACE ONE CAPSULE INTO INHALER AND INHALE DAILY  30 capsule  8  . vitamin D, CHOLECALCIFEROL, 400 UNITS tablet Take 400 Units by mouth daily.         No current facility-administered medications for this visit.    Allergies:    Allergies  Allergen Reactions  . Clarithromycin     Mouth/throat sore  . Penicillins Itching and Rash    Social History:  The patient  reports that she quit smoking about 3 years ago. Her smoking use included Cigarettes. She has a 55.8 pack-year smoking  history. She has never used smokeless tobacco. She reports that she does not drink alcohol or use illicit drugs.   ROS:  Please see the history of present illness.   Appetite is stable. Weight is been stable. No blood in urine or stool. No neurological complaints.   All other systems reviewed and negative.   OBJECTIVE: VS:  BP 128/68  Pulse 75  Ht _0  (1.626 m)  Wt 123 lb 6.4 oz (55.974 kg)  BMI 21.17 kg/m2 Well nourished, well developed, in no acute distress, elderly and frail HEENT: normal Neck: JVD flat. Carotid bruit absent  Cardiac:  normal S1, S2; RRR;  soft 1/6 right upper sternal border systolic murmur Lungs:  clear to auscultation bilaterally, no wheezing, rhonchi or rales Abd: soft, nontender, no hepatomegaly Ext: Edema none. Pulses 2+ and symmetric Skin: warm and dry Neuro:  CNs 2-12 intact, no focal abnormalities noted  EKG:  Normal sinus rhythm, left atrial abnormality, right atrial abnormality, incomplete right bundle branch block. No change from prior       Signed, [place], MD 02/06/2014 8:21 AM

## 2014-02-26 ENCOUNTER — Encounter: Payer: Self-pay | Admitting: *Deleted

## 2014-03-05 ENCOUNTER — Other Ambulatory Visit: Payer: Self-pay | Admitting: Interventional Cardiology

## 2014-04-18 ENCOUNTER — Ambulatory Visit (INDEPENDENT_AMBULATORY_CARE_PROVIDER_SITE_OTHER): Payer: Medicare Other | Admitting: Pulmonary Disease

## 2014-04-18 ENCOUNTER — Encounter: Payer: Self-pay | Admitting: Pulmonary Disease

## 2014-04-18 VITALS — BP 122/70 | HR 75 | Temp 97.1°F | Ht 64.25 in | Wt 131.4 lb

## 2014-04-18 DIAGNOSIS — J438 Other emphysema: Secondary | ICD-10-CM

## 2014-04-18 NOTE — Patient Instructions (Signed)
Will try stiolto 2 inhalations each am for the next 4 weeks.  Stop the spiriva while you are trying this. Please call me in 4 weeks with your response to the new medication, and if you would like to stay on this.  followup with me in one year if doing well.

## 2014-04-18 NOTE — Assessment & Plan Note (Signed)
The patient has found that she does much better when she takes Spiriva on a regular basis. Despite this, she still has significant dyspnea on exertion, and is not satisfied with her exertional tolerance. I would like to keep her on Spiriva, but will add a long-acting beta agonist to her regimen. We will need to keep in mind that beta agonists can sometimes aggravate atrial fibrillation.  Will try her on stiolto for a period of time, and she will let us know if she feels this helps her breathing more than Spiriva alone.

## 2014-04-18 NOTE — Progress Notes (Signed)
   Subjective:    Patient ID: [name], female    DOB: [birth year], 80 y.o.   MRN: [id number]  HPI The patient comes in today for follow-up of her known COPD. She has found that her breathing is much better when she stays on Spiriva on a regular basis. She has not had an acute exacerbation or pulmonary infection since the last visit. However, she is not satisfied with her exertional tolerance. She denies any significant cough, chest congestion, or purulence.   Review of Systems  Constitutional: Negative for fever and unexpected weight change.  HENT: Negative for congestion, dental problem, ear pain, nosebleeds, postnasal drip, rhinorrhea, sinus pressure, sneezing, sore throat and trouble swallowing.   Eyes: Negative for redness and itching.  Respiratory: Positive for cough and shortness of breath. Negative for chest tightness and wheezing.   Cardiovascular: Negative for palpitations and leg swelling.  Gastrointestinal: Negative for nausea and vomiting.  Genitourinary: Negative for dysuria.  Musculoskeletal: Negative for joint swelling.  Skin: Negative for rash.  Neurological: Negative for headaches.  Hematological: Does not bruise/bleed easily.  Psychiatric/Behavioral: Negative for dysphoric mood. The patient is not nervous/anxious.        Objective:   Physical Exam Thin female in no acute distress Nose without purulence or discharge noted Neck without lymphadenopathy or thyroid Chest with mildly decreased breath sounds, no wheezing Cardiac exam with regular rate and rhythm Lower extremities without edema, no cyanosis Alert and oriented, moves all 4 extremities.       Assessment & Plan:

## 2014-05-15 ENCOUNTER — Telehealth: Payer: Self-pay | Admitting: Pulmonary Disease

## 2014-05-15 MED ORDER — TIOTROPIUM BROMIDE-OLODATEROL 2.5-2.5 MCG/ACT IN AERS: 2.0000 | INHALATION_SPRAY | Freq: Every morning | RESPIRATORY_TRACT | Status: DC

## 2014-05-15 NOTE — Telephone Encounter (Signed)
Pt returning call - [phone]

## 2014-05-15 NOTE — Telephone Encounter (Signed)
Stiolto Rx sent to [facility]. Pt aware. Nothing further needed.

## 2014-05-15 NOTE — Telephone Encounter (Signed)
lmtcb for pt.  

## 2014-05-31 ENCOUNTER — Encounter (HOSPITAL_COMMUNITY): Payer: Self-pay | Admitting: Interventional Cardiology

## 2014-08-24 ENCOUNTER — Ambulatory Visit (INDEPENDENT_AMBULATORY_CARE_PROVIDER_SITE_OTHER): Payer: Medicare Other | Admitting: Family Medicine

## 2014-08-24 VITALS — BP 118/66 | HR 84 | Temp 98.3°F | Resp 17 | Ht 63.0 in | Wt 126.0 lb

## 2014-08-24 DIAGNOSIS — S39012A Strain of muscle, fascia and tendon of lower back, initial encounter: Secondary | ICD-10-CM | POA: Diagnosis not present

## 2014-08-24 MED ORDER — HYDROCODONE-ACETAMINOPHEN 5-325 MG PO TABS: 1.0000 | ORAL_TABLET | Freq: Four times a day (QID) | ORAL | Status: DC | PRN

## 2014-08-24 MED ORDER — PREDNISONE 20 MG PO TABS: 40.0000 mg | ORAL_TABLET | Freq: Every day | ORAL | Status: DC

## 2014-08-24 NOTE — Patient Instructions (Signed)
Please let us know if your back is not significantly better in 48 hours

## 2014-08-24 NOTE — Progress Notes (Signed)
Subjective:  This chart was scribed for Robyn Haber, MD by Donato Schultz, Medical Scribe. This patient was seen in Room 10 and the patient's care was started at 2:54 PM.   Patient ID: [name], female    DOB: [birth year], 81 y.o.   MRN: [id number]  HPI HPI Comments: [name] is a 81 y.o. female who presents to the [facility] complaining of constant back pain and right knee pain that started 2 days ago.  She states that she may have pulled a muscle in her back when trying to pull something heavy over her shoulder.  Both her knee and back pain started immediately after the accident.  She rates her pain 9/10 currently and has not seen any difference in her pain between yesterday and today despite applying ice to both her back and knee. Getting up to walk aggravates her pain however she is able to ambulate.   She has taken Extra Strength Tylenol with no relief to her symptoms.  She has taken Hydrocodone in the past when she had her open heart surgery.  She denies bowel and bladder incontinence and fever as associated symptoms.  Past Medical History  Diagnosis Date   Hypertension    Osteopenia    Hypothyroid    Aortic stenosis    Carotid artery disease    Hypercalcemia    Pressure urticaria    Allergic rhinitis    RLS (restless legs syndrome)    COPD (chronic obstructive pulmonary disease)    Colon polyps    AS (aortic stenosis) 03/06/2011   S/P aortic valve replacement with stentless valve 03/18/2011   Past Surgical History  Procedure Laterality Date   Breast lumpectomy      Left   Foot surgery      Morton's neuroma   Knee arthroscopy      Left   Cholecystectomy     Cataract extraction     Aortic root replacement  03/18/2011    1m Medtronic Freestyle Porcine aortic root with reimplantation of coronary arteries   Family History  Problem Relation Age of Onset   Emphysema Maternal Aunt    Asthma Maternal Aunt    Asthma  Maternal Uncle    Heart disease Maternal Grandmother    Cancer Mother     bladder   Lung cancer Father    Breast cancer Sister    Uterine cancer Sister    History   Social History   Marital Status: Widowed    Spouse Name: N/A   Number of Children: N   Years of Education: N/A   Occupational History   housewife    Social History Main Topics   Smoking status: Former Smoker -- 0.90 packs/day for 69years    Types: Cigarettes    Quit date: 07/14/2010   Smokeless tobacco: Never Used   Alcohol Use: No   Drug Use: No   Sexual Activity: Not on file   Other Topics Concern   Not on file   Social History Narrative   Allergies  Allergen Reactions   Clarithromycin     Mouth/throat sore   Penicillins Itching and Rash    Review of Systems  Constitutional: Negative for fever.  Gastrointestinal: Negative for vomiting and diarrhea.  Endocrine: Negative for polyuria.  Genitourinary: Negative for decreased urine volume, enuresis and difficulty urinating.  Musculoskeletal: Positive for back pain and arthralgias. Negative for gait problem.    Objective:  Physical Exam  Constitutional: She  is oriented to person, place, and time. She appears well-developed and well-nourished.  HENT:  Head: Normocephalic and atraumatic.  Eyes: EOM are normal.  Neck: Normal range of motion.  Cardiovascular: Normal rate.   Pulmonary/Chest: Effort normal.  Musculoskeletal: Normal range of motion.  Neurological: She is alert and oriented to person, place, and time.  Skin: Skin is warm and dry.  Psychiatric: She has a normal mood and affect. Her behavior is normal.  Nursing note and vitals reviewed.   Knee reflexes are normal ankle jerks are normal, patient has mild tenderness in the right lower lumbar region, straight leg raising is negative BP 118/66 mmHg   Pulse 84   Temp(Src) 98.3 F (36.8 C) (Oral)   Resp 17   Ht _0  (1.6 m)   Wt 126 lb (57.153 kg)   BMI 22.33 kg/m2   SpO2  91% Assessment & Plan:   This chart was scribed in my presence and reviewed by me personally.    ICD-9-CM ICD-10-CM   1. Lumbar strain, initial encounter 847.2 S39.012A predniSONE (DELTASONE) 20 MG tablet     HYDROcodone-acetaminophen (NORCO) 5-325 MG per tablet     Signed, Robyn Haber, MD

## 2014-08-29 ENCOUNTER — Ambulatory Visit (INDEPENDENT_AMBULATORY_CARE_PROVIDER_SITE_OTHER): Payer: Medicare Other | Admitting: Family Medicine

## 2014-08-29 VITALS — BP 112/72 | HR 72 | Temp 97.3°F | Resp 18 | Ht 64.0 in | Wt 129.0 lb

## 2014-08-29 DIAGNOSIS — S39012D Strain of muscle, fascia and tendon of lower back, subsequent encounter: Secondary | ICD-10-CM | POA: Diagnosis not present

## 2014-08-29 DIAGNOSIS — G8929 Other chronic pain: Secondary | ICD-10-CM | POA: Diagnosis not present

## 2014-08-29 DIAGNOSIS — M533 Sacrococcygeal disorders, not elsewhere classified: Secondary | ICD-10-CM

## 2014-08-29 MED ORDER — DICLOFENAC SODIUM 50 MG PO TBEC: 50.0000 mg | DELAYED_RELEASE_TABLET | Freq: Two times a day (BID) | ORAL | Status: DC

## 2014-08-29 MED ORDER — METHOCARBAMOL 500 MG PO TABS: 500.0000 mg | ORAL_TABLET | Freq: Three times a day (TID) | ORAL | Status: DC

## 2014-08-29 NOTE — Progress Notes (Signed)
Subjective: 81 year old lady who was here last week. Her problems started when she pulled out her [employer]ow. It has continued to hurt her in the right lower back, with reporting to the sacroiliac area. It also hurts in the left buttock. She has pain just below and lateral to the right knee also. This all happened at the same time. She has not had a history of back pain problems in the past. She was treated with prednisone and hydrocodone and really has not improved. The hydrocodone conservators some but she is taking care of that with taking some MiraLAX. She is a widow, lives independently, has a couple of sons.  Objective: Pleasant alert lady in no major distress until she moves around and she is slow moving because she hurts. Her left SI joint is tender and she has some tenderness in the deep muscle of the upper buttock. Her right knee has some pain distal and lateral to it. Straight leg raising test was negative until full 90 flexion of the left hip brings a little bit of pain in the SI region. Abduction of hip also causes a little pain.  Assessment: Lumbar and sacroiliac strain and pain Right knee strain  Plan: Robaxin low dose and diclofenac 50 mg. Cautioned her about potential side effects. Return if worse or not improving or not tolerating the medicines.

## 2014-08-29 NOTE — Patient Instructions (Signed)
Take the muscle relaxant one pill maximum of 3 times daily for muscle relaxation. This is a lower dose, but can cause drowsiness in older individuals so if you have problems with that you may have to split it in half, but a whole pill we'll do a better job.  Take the anti-inflammatory pain reliever one at breakfast and one at supper. If it causes stomach problems discontinue use  If not improving return  Continue to move around, but avoid lifting and straining

## 2014-10-03 ENCOUNTER — Ambulatory Visit (INDEPENDENT_AMBULATORY_CARE_PROVIDER_SITE_OTHER): Payer: Medicare Other | Admitting: Emergency Medicine

## 2014-10-03 ENCOUNTER — Ambulatory Visit (INDEPENDENT_AMBULATORY_CARE_PROVIDER_SITE_OTHER): Payer: Medicare Other

## 2014-10-03 VITALS — BP 110/60 | HR 73 | Temp 97.5°F | Resp 12 | Ht 64.25 in | Wt 125.6 lb

## 2014-10-03 DIAGNOSIS — M5432 Sciatica, left side: Secondary | ICD-10-CM | POA: Diagnosis not present

## 2014-10-03 DIAGNOSIS — S39012A Strain of muscle, fascia and tendon of lower back, initial encounter: Secondary | ICD-10-CM

## 2014-10-03 DIAGNOSIS — M4726 Other spondylosis with radiculopathy, lumbar region: Secondary | ICD-10-CM

## 2014-10-03 DIAGNOSIS — M4120 Other idiopathic scoliosis, site unspecified: Secondary | ICD-10-CM

## 2014-10-03 DIAGNOSIS — S39012S Strain of muscle, fascia and tendon of lower back, sequela: Secondary | ICD-10-CM | POA: Diagnosis not present

## 2014-10-03 MED ORDER — MELOXICAM 15 MG PO TABS: 15.0000 mg | ORAL_TABLET | Freq: Every day | ORAL | Status: DC

## 2014-10-03 MED ORDER — HYDROCODONE-ACETAMINOPHEN 5-325 MG PO TABS: 1.0000 | ORAL_TABLET | Freq: Four times a day (QID) | ORAL | Status: DC | PRN

## 2014-10-03 NOTE — Progress Notes (Signed)
Subjective:  Patient ID: [name], female    DOB: 04-26-1930  Age: 81 y.o. MRN: [id number]  CC: Hip Pain   HPI [name] presents for continued pain in her left hip. He was seen initially on 08/24/2014 and again on 08/29/2014. Her initial visit she was treated with Vicodin and prednisone and failed to improve. She then returned and was put on Voltaren and Robaxin and once again failed to improve.  She has no history of injury or overuse has no history of falling. Has no radicular symptoms or neurologic symptoms involving the low back. She is most uncomfortable sitting and laying in her pain goes away when she stands and walks. She has received no benefit from medication she's been put on so far.  No improvement with over the counter medications or other home remedies. Denies other complaint or health concern today.   Outpatient Prescriptions Prior to Visit  Medication Sig Dispense Refill  . albuterol (PROVENTIL HFA;VENTOLIN HFA) 108 (90 BASE) MCG/ACT inhaler Inhale 2 puffs into the lungs every 6 (six) hours as needed for wheezing. 1 Inhaler 0  . amLODipine (NORVASC) 10 MG tablet TAKE ONE TABLET BY MOUTH ONCE DAILY 30 tablet 11  . aspirin 81 MG tablet Take 81 mg by mouth daily.    . cetirizine (ZYRTEC) 10 MG tablet Take 10 mg by mouth daily.      . diclofenac (VOLTAREN) 50 MG EC tablet Take 1 tablet (50 mg total) by mouth 2 (two) times daily. 20 tablet 0  . Ferrous Sulfate (IRON) 325 (65 FE) MG TABS Take 325 mg by mouth every other day.     . levothyroxine (SYNTHROID, LEVOTHROID) 125 MCG tablet Take 125 mcg by mouth daily.    . Multiple Vitamins-Minerals (MULTIVITAMINS THER. W/MINERALS) TABS Take 1 tablet by mouth daily.      . simvastatin (ZOCOR) 20 MG tablet Take 20 mg by mouth at bedtime.      . Tiotropium Bromide-Olodaterol (STIOLTO RESPIMAT) 2.5-2.5 MCG/ACT AERS Inhale 2 puffs into the lungs every morning. 1 Inhaler 3  . vitamin D, CHOLECALCIFEROL, 400 UNITS tablet Take 400  Units by mouth daily.      . hydrochlorothiazide (HYDRODIURIL) 25 MG tablet Take 1 tablet (25 mg total) by mouth daily.    . methocarbamol (ROBAXIN) 500 MG tablet Take 1 tablet (500 mg total) by mouth 3 (three) times daily. (Patient not taking: Reported on 10/03/2014) 30 tablet 0  . predniSONE (DELTASONE) 20 MG tablet Take 2 tablets (40 mg total) by mouth daily. (Patient not taking: Reported on 08/29/2014) 10 tablet 0  . HYDROcodone-acetaminophen (NORCO) 5-325 MG per tablet Take 1 tablet by mouth every 6 (six) hours as needed for moderate pain. (Patient not taking: Reported on 10/03/2014) 30 tablet 0   No facility-administered medications prior to visit.    ROS Review of Systems  Constitutional: Negative for fever, chills and appetite change.  HENT: Negative for congestion, ear pain, postnasal drip, sinus pressure and sore throat.   Eyes: Negative for pain and redness.  Respiratory: Negative for cough, shortness of breath and wheezing.   Cardiovascular: Negative for leg swelling.  Gastrointestinal: Negative for nausea, vomiting, abdominal pain, diarrhea, constipation and blood in stool.  Endocrine: Negative for polyuria.  Genitourinary: Negative for dysuria, urgency, frequency and flank pain.  Musculoskeletal: Positive for arthralgias. Negative for gait problem.  Skin: Negative for rash.  Neurological: Negative for weakness and headaches.  Psychiatric/Behavioral: Negative for confusion and decreased concentration. The patient  is not nervous/anxious.     Objective:  BP 110/60 mmHg  Pulse 73  Temp(Src) 97.5 F (36.4 C) (Oral)  Resp 12  Ht 5' 4.25" (1.632 m)  Wt 125 lb 9.6 oz (56.972 kg)  BMI 21.39 kg/m2  SpO2 91%  BP Readings from Last 3 Encounters:  10/03/14 110/60  08/29/14 112/72  08/24/14 118/66    Wt Readings from Last 3 Encounters:  10/03/14 125 lb 9.6 oz (56.972 kg)  08/29/14 129 lb (58.514 kg)  08/24/14 126 lb (57.153 kg)    Physical Exam  Constitutional: She is  oriented to person, place, and time. She appears well-developed and well-nourished. No distress.  HENT:  Head: Normocephalic and atraumatic.  Right Ear: External ear normal.  Left Ear: External ear normal.  Nose: Nose normal.  Eyes: Conjunctivae and EOM are normal. Pupils are equal, round, and reactive to light. No scleral icterus.  Neck: Normal range of motion. Neck supple. No tracheal deviation present.  Cardiovascular: Normal rate, regular rhythm and normal heart sounds.   Pulmonary/Chest: Effort normal. No respiratory distress. She has no wheezes. She has no rales.  Abdominal: She exhibits no mass. There is no tenderness. There is no rebound and no guarding.  Musculoskeletal: She exhibits tenderness. She exhibits no edema.       Left hip: She exhibits normal range of motion, no tenderness and no bony tenderness.       Legs: Lymphadenopathy:    She has no cervical adenopathy.  Neurological: She is alert and oriented to person, place, and time. Coordination normal.  Skin: Skin is warm and dry. No rash noted.  Psychiatric: She has a normal mood and affect. Her behavior is normal.    Lab Results  Component Value Date   WBC 9.4 05/13/2011   HGB 14.5 05/13/2011   HCT 45.6 05/13/2011   PLT 236 05/13/2011   GLUCOSE 104* 05/13/2011   ALT 21 03/16/2011   AST 19 03/16/2011   NA 138 05/13/2011   K 4.1 05/13/2011   CL 100 05/13/2011   CREATININE 1.05 05/13/2011   BUN 18 05/13/2011   CO2 29 05/13/2011   TSH 1.443 05/13/2011   INR 1.80* 05/22/2011   HGBA1C 5.7* 03/16/2011      Assessment & Plan:   [name] was seen today for hip pain.  Diagnoses and all orders for this visit:  Sciatic neuritis, left Orders: -     DG Lumbar Spine Complete; Future -     Ambulatory referral to Orthopedic Surgery  Idiopathic scoliosis Orders: -     Ambulatory referral to Orthopedic Surgery  Osteoarthritis of spine with radiculopathy, lumbar region Orders: -     Ambulatory referral to  Orthopedic Surgery  Lumbar strain, initial encounter Orders: -     HYDROcodone-acetaminophen (NORCO) 5-325 MG per tablet; Take 1-2 tablets by mouth every 6 (six) hours as needed for moderate pain.  Other orders -     meloxicam (MOBIC) 15 MG tablet; Take 1 tablet (15 mg total) by mouth daily.   I have changed [name]'s HYDROcodone-acetaminophen. I am also having her start on meloxicam. Additionally, I am having her maintain her Iron, vitamin D (CHOLECALCIFEROL), cetirizine, simvastatin, multivitamins ther. w/minerals, hydrochlorothiazide, levothyroxine, albuterol, aspirin, amLODipine, Tiotropium Bromide-Olodaterol, predniSONE, diclofenac, and methocarbamol.  Meds ordered this encounter  Medications  . HYDROcodone-acetaminophen (NORCO) 5-325 MG per tablet    Sig: Take 1-2 tablets by mouth every 6 (six) hours as needed for moderate pain.    Dispense:  40  tablet    Refill:  0  . meloxicam (MOBIC) 15 MG tablet    Sig: Take 1 tablet (15 mg total) by mouth daily.    Dispense:  30 tablet    Refill:  0     Follow-up: follow up as needed.  Roselee Culver, MD   [facility] reading (PRIMARY) by  Dr. Ouida Sills.  Scoliosis and DJD.

## 2014-10-15 ENCOUNTER — Other Ambulatory Visit: Payer: Self-pay | Admitting: Pulmonary Disease

## 2014-11-20 ENCOUNTER — Telehealth: Payer: Self-pay | Admitting: Pulmonary Disease

## 2014-11-20 MED ORDER — TIOTROPIUM BROMIDE-OLODATEROL 2.5-2.5 MCG/ACT IN AERS: 2.0000 | INHALATION_SPRAY | Freq: Every day | RESPIRATORY_TRACT | Status: DC

## 2014-11-20 NOTE — Telephone Encounter (Signed)
Rx has been sent in. Pt is aware. Nothing further was needed. 

## 2015-02-18 ENCOUNTER — Encounter: Payer: Self-pay | Admitting: Interventional Cardiology

## 2015-02-18 ENCOUNTER — Ambulatory Visit (INDEPENDENT_AMBULATORY_CARE_PROVIDER_SITE_OTHER): Payer: Medicare Other | Admitting: Interventional Cardiology

## 2015-02-18 VITALS — BP 146/70 | HR 81 | Wt 122.4 lb

## 2015-02-18 DIAGNOSIS — J432 Centrilobular emphysema: Secondary | ICD-10-CM

## 2015-02-18 DIAGNOSIS — Z954 Presence of other heart-valve replacement: Secondary | ICD-10-CM | POA: Diagnosis not present

## 2015-02-18 DIAGNOSIS — I480 Paroxysmal atrial fibrillation: Secondary | ICD-10-CM

## 2015-02-18 DIAGNOSIS — I779 Disorder of arteries and arterioles, unspecified: Secondary | ICD-10-CM

## 2015-02-18 DIAGNOSIS — I10 Essential (primary) hypertension: Secondary | ICD-10-CM

## 2015-02-18 DIAGNOSIS — I739 Peripheral vascular disease, unspecified: Secondary | ICD-10-CM

## 2015-02-18 DIAGNOSIS — Z952 Presence of prosthetic heart valve: Secondary | ICD-10-CM

## 2015-02-18 MED ORDER — AMLODIPINE BESYLATE 10 MG PO TABS: 10.0000 mg | ORAL_TABLET | Freq: Every day | ORAL | Status: DC

## 2015-02-18 MED ORDER — HYDROCHLOROTHIAZIDE 25 MG PO TABS: 25.0000 mg | ORAL_TABLET | Freq: Every day | ORAL | Status: DC

## 2015-02-18 MED ORDER — PROPRANOLOL HCL ER 60 MG PO CP24: 60.0000 mg | ORAL_CAPSULE | Freq: Every day | ORAL | Status: DC

## 2015-02-18 NOTE — Patient Instructions (Signed)
Medication Instructions:  Your physician has recommended you make the following change in your medication: START Inderal LA 46m daily. An Rx has been sent to your pharmacy   Labwork: None orderd  Testing/Procedures: Your physician has requested that you have an echocardiogram. Echocardiography is a painless test that uses sound waves to create images of your heart. It provides your doctor with information about the size and shape of your heart and how well your heart's chambers and valves are working. This procedure takes approximately one hour. There are no restrictions for this procedure.  Your physician has requested that you have a carotid duplex. This test is an ultrasound of the carotid arteries in your neck. It looks at blood flow through these arteries that supply the brain with blood. Allow one hour for this exam. There are no restrictions or special instructions.    Follow-Up: Your physician wants you to follow-up in: 1 year with Dr.Smith You will receive a reminder letter in the mail two months in advance. If you don't receive a letter, please call our office to schedule the follow-up appointment.  Any Other Special Instructions Will Be Listed Below (If Applicable). Call the office in 4 weeks to let [place] if the tremors have improved.

## 2015-02-18 NOTE — Progress Notes (Signed)
Cardiology Office Note   Date:  02/18/2015   ID:  REEYA Rich, DOB [birth year], MRN [id number]  PCP:  Horton Finer, MD  Cardiologist:  Sinclair Grooms, MD   Chief Complaint  Patient presents with  . Cardiac Valve Problem      History of Present Illness: [name] is a 81 y.o. female who presents for  Hypertension, aortic valve bioprosthesis, carotid disease, COPD, and hyperlipidemia.   Pulsatile tinnitus has become an issue when she sleeps at night. She is having no difficulty with ambulation. She denies orthopnea.    There is difficulty with intentional tremor. This is preventing her from doing things such as riding. She is having no difficulty with gait. She denies falls and loss of memory.    Past Medical History  Diagnosis Date  . Hypertension   . Osteopenia   . Hypothyroid   . Aortic stenosis   . Carotid artery disease ([place])   . Hypercalcemia   . Pressure urticaria   . Allergic rhinitis   . RLS (restless legs syndrome)   . COPD (chronic obstructive pulmonary disease) ([place])   . Colon polyps   . AS (aortic stenosis) 03/06/2011  . S/P aortic valve replacement with stentless valve 03/18/2011    Past Surgical History  Procedure Laterality Date  . Breast lumpectomy      Left  . Foot surgery      Morton's neuroma  . Knee arthroscopy      Left  . Cholecystectomy    . Cataract extraction    . Aortic root replacement  03/18/2011    16m Medtronic Freestyle Porcine aortic root with reimplantation of coronary arteries     Current Outpatient Prescriptions  Medication Sig Dispense Refill  . albuterol (PROVENTIL HFA;VENTOLIN HFA) 108 (90 BASE) MCG/ACT inhaler Inhale 2 puffs into the lungs every 6 (six) hours as needed for wheezing. 1 Inhaler 0  . amLODipine (NORVASC) 10 MG tablet Take 1 tablet (10 mg total) by mouth daily. 90 tablet 3  . aspirin 81 MG tablet Take 81 mg by mouth daily.    . cetirizine (ZYRTEC) 10 MG tablet Take 10 mg by mouth  daily.      . Ferrous Sulfate (IRON) 325 (65 FE) MG TABS Take 325 mg by mouth every other day.     . levothyroxine (SYNTHROID, LEVOTHROID) 125 MCG tablet Take 125 mcg by mouth daily.    . methocarbamol (ROBAXIN) 500 MG tablet Take 1 tablet (500 mg total) by mouth 3 (three) times daily. 30 tablet 0  . Multiple Vitamins-Minerals (MULTIVITAMINS THER. W/MINERALS) TABS Take 1 tablet by mouth daily.      . simvastatin (ZOCOR) 20 MG tablet Take 20 mg by mouth at bedtime.      . Tiotropium Bromide-Olodaterol (STIOLTO RESPIMAT) 2.5-2.5 MCG/ACT AERS Inhale 2 puffs into the lungs daily. 4 g 5  . vitamin D, CHOLECALCIFEROL, 400 UNITS tablet Take 400 Units by mouth daily.      . hydrochlorothiazide (HYDRODIURIL) 25 MG tablet Take 1 tablet (25 mg total) by mouth daily. 90 tablet 3  . propranolol ER (INDERAL LA) 60 MG 24 hr capsule Take 1 capsule (60 mg total) by mouth daily. 180 capsule 3   No current facility-administered medications for this visit.    Allergies:   Clarithromycin and Penicillins    Social History:  The patient  reports that she quit smoking about 4 years ago. Her smoking use included Cigarettes. She has a 55.8  pack-year smoking history. She has never used smokeless tobacco. She reports that she does not drink alcohol or use illicit drugs.   Family History:  The patient's family history includes Asthma in her maternal aunt and maternal uncle; Breast cancer in her sister; Cancer in her mother; Emphysema in her maternal aunt; Heart disease in her maternal grandmother; Lung cancer in her father; Uterine cancer in her sister.    ROS:  Please see the history of present illness.   Otherwise, review of systems are positive for  Tremor, palpitations, dyspnea, back discomfort, and anxiety..   All other systems are reviewed and negative.    PHYSICAL EXAM: VS:  BP 146/70 mmHg  Pulse 81  Wt 55.52 kg (122 lb 6.4 oz) , BMI Body mass index is 20.85 kg/(m^2). GEN: Well nourished, well developed, in  no acute distress HEENT: normal Neck: no JVD, carotid bruits, or masses Cardiac: RRR.  There  is no murmur, rub, or gallop. There is  no edema. Respiratory:  clear to auscultation bilaterally, normal work of breathing. GI: soft, nontender, nondistended, + BS MS: no deformity or atrophy Skin: warm and dry, no rash Neuro:  Strength and sensation are intact Psych: euthymic mood, full affect   EKG:  EKG  is ordered today. The ekg reveals  Normal sinus rhythm, left axis deviation, ectopic atrial pacemaker rhythm , left atrial abnormality,   Recent Labs: No results found for requested labs within last 365 days.    Lipid Panel No results found for: CHOL, TRIG, HDL, CHOLHDL, VLDL, LDLCALC, LDLDIRECT    Wt Readings from Last 3 Encounters:  02/18/15 55.52 kg (122 lb 6.4 oz)  10/03/14 56.972 kg (125 lb 9.6 oz)  08/29/14 58.514 kg (129 lb)      Other studies Reviewed: Additional studies/ records that were reviewed today include:  none.    ASSESSMENT AND PLAN:  1. Paroxysmal atrial fibrillation (HCC)  no clinical recurrence  2. S/P aortic valve replacement and aortoplasty  normal by auscultation  3. Centrilobular emphysema ([place])  asymptomatic  4. Essential hypertension  mildly elevated  5. Bilateral carotid artery disease ([place])  right greater than left carotid bruit   6. Tremor on intention  Current medicines are reviewed at length with the patient today.  The patient has the following concerns regarding medicines:  none.  The following changes/actions have been instituted:     low-dose nonselective beta blocker therapy For tremor   the low-dose beta blocker will also help improve blood pressure  Labs/ tests ordered today include:   Orders Placed This Encounter  Procedures  . EKG 12-Lead  . Echocardiogram     Disposition:   FU with HS in 1 year  Signed, Sinclair Grooms, MD  02/18/2015 10:19 AM    [facility] [address],  [place], [place]  [address] Phone: [phone]; Fax: [phone]

## 2015-02-26 ENCOUNTER — Ambulatory Visit (HOSPITAL_COMMUNITY)
Admission: RE | Admit: 2015-02-26 | Discharge: 2015-02-26 | Disposition: A | Discharge status: Home / Self Care | Payer: Medicare Other | Source: Ambulatory Visit | Attending: Interventional Cardiology | Admitting: Interventional Cardiology

## 2015-02-26 ENCOUNTER — Other Ambulatory Visit: Payer: Self-pay

## 2015-02-26 ENCOUNTER — Ambulatory Visit (HOSPITAL_BASED_OUTPATIENT_CLINIC_OR_DEPARTMENT_OTHER): Payer: Medicare Other

## 2015-02-26 DIAGNOSIS — I6523 Occlusion and stenosis of bilateral carotid arteries: Secondary | ICD-10-CM | POA: Diagnosis not present

## 2015-02-26 DIAGNOSIS — I779 Disorder of arteries and arterioles, unspecified: Secondary | ICD-10-CM | POA: Diagnosis not present

## 2015-02-26 DIAGNOSIS — J449 Chronic obstructive pulmonary disease, unspecified: Secondary | ICD-10-CM | POA: Insufficient documentation

## 2015-02-26 DIAGNOSIS — Z87891 Personal history of nicotine dependence: Secondary | ICD-10-CM | POA: Insufficient documentation

## 2015-02-26 DIAGNOSIS — I071 Rheumatic tricuspid insufficiency: Secondary | ICD-10-CM | POA: Diagnosis not present

## 2015-02-26 DIAGNOSIS — Z954 Presence of other heart-valve replacement: Secondary | ICD-10-CM | POA: Diagnosis not present

## 2015-02-26 DIAGNOSIS — I10 Essential (primary) hypertension: Secondary | ICD-10-CM | POA: Insufficient documentation

## 2015-02-26 DIAGNOSIS — E785 Hyperlipidemia, unspecified: Secondary | ICD-10-CM | POA: Diagnosis not present

## 2015-02-26 DIAGNOSIS — I4891 Unspecified atrial fibrillation: Secondary | ICD-10-CM | POA: Insufficient documentation

## 2015-02-26 DIAGNOSIS — Z952 Presence of prosthetic heart valve: Secondary | ICD-10-CM

## 2015-02-26 DIAGNOSIS — I739 Peripheral vascular disease, unspecified: Secondary | ICD-10-CM

## 2015-03-01 ENCOUNTER — Telehealth: Payer: Self-pay | Admitting: Interventional Cardiology

## 2015-03-01 NOTE — Telephone Encounter (Signed)
Gave patient results of her test

## 2015-03-01 NOTE — Telephone Encounter (Signed)
New Message   Pt states that she is returning Dr. Darliss Ridgel call

## 2015-03-21 ENCOUNTER — Telehealth: Payer: Self-pay | Admitting: Interventional Cardiology

## 2015-03-21 NOTE — Telephone Encounter (Signed)
New message     Pt c/o medication issue:  1. Name of Medication: propranolol  2. How are you currently taking this medication (dosage and times per day)?  71m daily 3. Are you having a reaction (difficulty breathing--STAT)? no 4. What is your medication issue? Calling to let the doctor know medication is working great----no problems

## 2015-03-21 NOTE — Telephone Encounter (Signed)
Noted. Update FYI fwd to Dr.Smith

## 2015-04-19 ENCOUNTER — Ambulatory Visit: Payer: Medicare Other | Admitting: Pulmonary Disease

## 2015-04-19 ENCOUNTER — Encounter: Payer: Self-pay | Admitting: Emergency Medicine

## 2015-04-19 ENCOUNTER — Ambulatory Visit (INDEPENDENT_AMBULATORY_CARE_PROVIDER_SITE_OTHER): Payer: Medicare Other | Admitting: Emergency Medicine

## 2015-04-19 VITALS — BP 104/72 | HR 53 | Ht 64.25 in | Wt 124.0 lb

## 2015-04-19 DIAGNOSIS — J438 Other emphysema: Secondary | ICD-10-CM | POA: Diagnosis not present

## 2015-04-19 MED ORDER — ALBUTEROL SULFATE (2.5 MG/3ML) 0.083% IN NEBU: 2.5000 mg | INHALATION_SOLUTION | RESPIRATORY_TRACT | Status: DC | PRN

## 2015-04-19 NOTE — Addendum Note (Signed)
Addended by: Desmond Dike C on: 04/19/2015 11:54 AM   Modules accepted: Orders

## 2015-04-19 NOTE — Assessment & Plan Note (Signed)
She describes progressive exertional dyspnea, does not sound exacerbated at this time. I believe this is likely due to slow progression of her COPD. Certainly cardiac isues contribute but she has been well compensated from cardiac standpoint. She did recently start propranolol which could influence bronchospasm and effectiveness of her BD's but her sx seem to pre-date the addition of this med. She may be approaching the need for oxygen - we will assess walking oximetry today. Also she wonders if she would get better BD delivery with a nebulizer. I will order albuterol nebs prn so she can try this. Continue stiolto for now.

## 2015-04-19 NOTE — Progress Notes (Signed)
Subjective:    Patient ID: [name], female    DOB: July 29, 1929, 81 y.o.   MRN: [id number]  HPI 81 year old woman, former smoker (50  Pack years), followed by Dr. Gwenette Greet in the past for COPD. Also with a history of hypertension, aortic valve bioprosthesis. She has been managed on Spiriva, was changed to [place] over a year to see if she would benefit more. She reports exertional SOB, difficulty getting a deep breath walking through the house or on an incline. She has to rest when doing vacuuming. She feels the breathing has been slowly worse.  She has uses albuterol at these times with some improvement.  She was started on propranolol recently, ? Whether this could be a factor.     Review of Systems  Constitutional: Positive for activity change. Negative for fever and unexpected weight change.  HENT: Negative for congestion, postnasal drip, rhinorrhea and sneezing.   Respiratory: Positive for shortness of breath. Negative for apnea, cough, chest tightness, wheezing and stridor.    As per HPI  Past Medical History  Diagnosis Date  . Hypertension   . Osteopenia   . Hypothyroid   . Aortic stenosis   . Carotid artery disease ([place])   . Hypercalcemia   . Pressure urticaria   . Allergic rhinitis   . RLS (restless legs syndrome)   . COPD (chronic obstructive pulmonary disease) ([place])   . Colon polyps   . AS (aortic stenosis) 03/06/2011  . S/P aortic valve replacement with stentless valve 03/18/2011     Family History  Problem Relation Age of Onset  . Emphysema Maternal Aunt   . Asthma Maternal Aunt   . Asthma Maternal Uncle   . Heart disease Maternal Grandmother   . Cancer Mother     bladder  . Lung cancer Father   . Breast cancer Sister   . Uterine cancer Sister      Social History   Social History  . Marital Status: Widowed    Spouse Name: N/A  . Number of Children: N  . Years of Education: N/A   Occupational History  . housewife    Social History Main Topics    . Smoking status: Former Smoker -- 0.90 packs/day for 62 years    Types: Cigarettes    Quit date: 07/14/2010  . Smokeless tobacco: Never Used  . Alcohol Use: No  . Drug Use: No  . Sexual Activity: Not on file   Other Topics Concern  . Not on file   Social History Narrative     Allergies  Allergen Reactions  . Clarithromycin     Mouth/throat sore  . Penicillins Itching and Rash     Outpatient Prescriptions Prior to Visit  Medication Sig Dispense Refill  . amLODipine (NORVASC) 10 MG tablet Take 1 tablet (10 mg total) by mouth daily. 90 tablet 3  . aspirin 81 MG tablet Take 81 mg by mouth daily.    . cetirizine (ZYRTEC) 10 MG tablet Take 10 mg by mouth daily.      . Ferrous Sulfate (IRON) 325 (65 FE) MG TABS Take 325 mg by mouth every other day.     . hydrochlorothiazide (HYDRODIURIL) 25 MG tablet Take 1 tablet (25 mg total) by mouth daily. 90 tablet 3  . levothyroxine (SYNTHROID, LEVOTHROID) 125 MCG tablet Take 125 mcg by mouth daily.    . methocarbamol (ROBAXIN) 500 MG tablet Take 1 tablet (500 mg total) by mouth 3 (three) times daily. 30 tablet  0  . Multiple Vitamins-Minerals (MULTIVITAMINS THER. W/MINERALS) TABS Take 1 tablet by mouth daily.      . propranolol ER (INDERAL LA) 60 MG 24 hr capsule Take 1 capsule (60 mg total) by mouth daily. 180 capsule 3  . simvastatin (ZOCOR) 20 MG tablet Take 20 mg by mouth at bedtime.      . Tiotropium Bromide-Olodaterol (STIOLTO RESPIMAT) 2.5-2.5 MCG/ACT AERS Inhale 2 puffs into the lungs daily. 4 g 5  . vitamin D, CHOLECALCIFEROL, 400 UNITS tablet Take 400 Units by mouth daily.      Marland Kitchen albuterol (PROVENTIL HFA;VENTOLIN HFA) 108 (90 BASE) MCG/ACT inhaler Inhale 2 puffs into the lungs every 6 (six) hours as needed for wheezing. (Patient not taking: Reported on 04/19/2015) 1 Inhaler 0   No facility-administered medications prior to visit.          Objective:   Physical Exam Filed Vitals:   04/19/15 1113 04/19/15 1114  BP:  104/72   Pulse:  53  Height: 5' 4.25" (1.632 m)   Weight: 124 lb (56.246 kg)   SpO2:  90%   Gen: Pleasant, thin woman,  in no distress,  normal affect  ENT: No lesions,  mouth clear,  oropharynx clear, no postnasal drip  Neck: No JVD, no TMG, no carotid bruits  Lungs: No use of accessory muscles, distant, clear without rales or rhonchi  Cardiovascular: RRR, heart sounds normal, no murmur or gallops, no peripheral edema  Musculoskeletal: No deformities, no cyanosis or clubbing  Neuro: alert, non focal  Skin: Warm, no lesions or rashes      Assessment & Plan:  COPD (chronic obstructive pulmonary disease) with emphysema She describes progressive exertional dyspnea, does not sound exacerbated at this time. I believe this is likely due to slow progression of her COPD. Certainly cardiac isues contribute but she has been well compensated from cardiac standpoint. She did recently start propranolol which could influence bronchospasm and effectiveness of her BD's but her sx seem to pre-date the addition of this med. She may be approaching the need for oxygen - we will assess walking oximetry today. Also she wonders if she would get better BD delivery with a nebulizer. I will order albuterol nebs prn so she can try this. Continue stiolto for now.

## 2015-04-19 NOTE — Patient Instructions (Signed)
Please continue using Stiolto once a day We will obtain a nebulizer machine for you to use albuterol up to every 4 hours if needed for shortness of breath You may continue to keep you her albuterol inhaler available to use as needed as well Walking oximetry today on room air Follow with Dr Lamonte Sakai in 3 months or sooner if you have any problems.

## 2015-04-22 ENCOUNTER — Telehealth: Payer: Self-pay | Admitting: Emergency Medicine

## 2015-04-22 NOTE — Telephone Encounter (Signed)
Called spoke with [facility]. Was advised that on pt albuterol nebulizer they will not cover it since it states PRN 4 HRS. The PRN will need to be taken off. Please advise RB if this is okay?

## 2015-04-23 NOTE — Telephone Encounter (Signed)
Not sure I understand this - should be able to order as prn, give instructions regarding how much to dispense.

## 2015-04-24 MED ORDER — ALBUTEROL SULFATE (2.5 MG/3ML) 0.083% IN NEBU: 2.5000 mg | INHALATION_SOLUTION | RESPIRATORY_TRACT | Status: DC

## 2015-04-24 NOTE — Telephone Encounter (Signed)
OK write it as "q4h and prn for SOB"

## 2015-04-24 NOTE — Telephone Encounter (Signed)
Medicare will not cover PRN medications-it either needs to be written "q4h" or "q4h AND as needed" for medicare to cover the rx.

## 2015-04-24 NOTE — Telephone Encounter (Signed)
Called spoke with [facility]. Gave VO for below. Nothing further needed

## 2015-06-08 ENCOUNTER — Ambulatory Visit (INDEPENDENT_AMBULATORY_CARE_PROVIDER_SITE_OTHER): Payer: Medicare Other | Admitting: Family Medicine

## 2015-06-08 VITALS — BP 110/64 | HR 76 | Temp 98.4°F | Resp 18 | Ht 63.75 in | Wt 127.0 lb

## 2015-06-08 DIAGNOSIS — R059 Cough, unspecified: Secondary | ICD-10-CM

## 2015-06-08 DIAGNOSIS — R05 Cough: Secondary | ICD-10-CM | POA: Diagnosis not present

## 2015-06-08 DIAGNOSIS — J209 Acute bronchitis, unspecified: Secondary | ICD-10-CM | POA: Diagnosis not present

## 2015-06-08 DIAGNOSIS — R062 Wheezing: Secondary | ICD-10-CM

## 2015-06-08 MED ORDER — PREDNISONE 20 MG PO TABS: ORAL_TABLET | ORAL | Status: DC

## 2015-06-08 MED ORDER — CEFDINIR 300 MG PO CAPS: 300.0000 mg | ORAL_CAPSULE | Freq: Two times a day (BID) | ORAL | Status: DC

## 2015-06-08 NOTE — Progress Notes (Signed)
Patient ID: Zada Girt, female    DOB: 1930/05/06  Age: 82 y.o. MRN: [id number]  Chief Complaint  Patient presents with  . Cough    3 days, productive cough with yellow phlegm.     Subjective:   82 year old lady who lives independently. She does not smoke.he has had a cough for the last 3 days. It is productive of some yellow mucus. She coughs a lot at night. She is gotten worse the last day or so. She has not had a lot of head congestion or drainage. She has not had fever. She does have a history of emphysema, and is followed by a pulmonologist She has a nebulizer machine that has not helped a lot.  Current allergies, medications, problem list, past/family and social histories reviewed.  Objective:  BP 110/64 mmHg  Pulse 76  Temp(Src) 98.4 F (36.9 C) (Oral)  Resp 18  Ht 5' 3.75" (1.619 m)  Wt 127 lb (57.607 kg)  BMI 21.98 kg/m2  SpO2 96%  pleasant lady, alert and oriented.TMs normal. Throat clear. Neck supple without nodes. Chest has bilateral expiratory wheezes. Heart regular without murmurs.  Assessment & Plan:   Assessment: 1. Wheezing   2. Cough   3. Acute bronchitis, unspecified organism       Plan: Will treat for the wheezing and bronchitis If she is not improving she needs to return for further assessment.  No orders of the defined types were placed in this encounter.    Meds ordered this encounter  Medications  . predniSONE (DELTASONE) 20 MG tablet    Sig: Take 2 daily for 3 days, then 1 daily. Best taken after her morning meal    Dispense:  9 tablet    Refill:  0  . cefdinir (OMNICEF) 300 MG capsule    Sig: Take 1 capsule (300 mg total) by mouth 2 (two) times daily.    Dispense:  20 capsule    Refill:  0         Patient Instructions  Continue using your albuterol nebulizer every 4-6 hours  Take the Omnicef (cefdinir) one twice daily for infection  Take prednisone 20 mg 2 tablets daily for 3 days, then for 3 days to try and break the  wheezing.  Return if not improving  Drink plenty of fluids and get enough rest      Return if symptoms worsen or fail to improve.   HOPPER,DAVID, MD 06/08/2015

## 2015-06-08 NOTE — Patient Instructions (Signed)
Continue using your albuterol nebulizer every 4-6 hours  Take the Omnicef (cefdinir) one twice daily for infection  Take prednisone 20 mg 2 tablets daily for 3 days, then for 3 days to try and break the wheezing.  Return if not improving  Drink plenty of fluids and get enough rest

## 2015-06-10 ENCOUNTER — Other Ambulatory Visit: Payer: Self-pay | Admitting: Emergency Medicine

## 2015-06-17 LAB — HM MAMMOGRAPHY

## 2015-06-21 ENCOUNTER — Ambulatory Visit (INDEPENDENT_AMBULATORY_CARE_PROVIDER_SITE_OTHER): Payer: Medicare Other | Admitting: Physician Assistant

## 2015-06-21 VITALS — BP 120/80 | HR 60 | Temp 98.6°F | Resp 19 | Ht 63.78 in | Wt 125.4 lb

## 2015-06-21 DIAGNOSIS — H6691 Otitis media, unspecified, right ear: Secondary | ICD-10-CM | POA: Diagnosis not present

## 2015-06-21 MED ORDER — CEFDINIR 300 MG PO CAPS: 300.0000 mg | ORAL_CAPSULE | Freq: Two times a day (BID) | ORAL | Status: DC

## 2015-06-21 NOTE — Patient Instructions (Signed)
Take cefdinir twice a day for 10 days. Eat yogurt or take a probiotic daily. Return in 1 week if symptoms not improving.

## 2015-06-21 NOTE — Progress Notes (Signed)
[facility] [address], [place] [address] [phone]  Date:  06/21/2015   Name:  [name]   DOB:  01-16-1930   MRN:  [id number]  PCP:  No PCP Per Patient    Chief Complaint: Ear Pain and Jaw Pain   History of Present Illness:  This is a 82 y.o. female with PMH HTN, A fib, COPD, s/p aortic valve replacement who is presenting with right ear and jaw pain since waking this morning. States there is a swollen knot in front of her right ear. She is getting sharp pain extending from anterior ear into right jaw and down into right side of neck. She is otherwise feeling ok. She had bronchitis 2 weeks ago. Treated with cefdinir. Finished 4 days ago. Those symptoms have completely resolved. She denies current cough, nasal congestion, sore throat, fever or chills. No hx env allergies.  Review of Systems:  Review of Systems See HPI  Patient Active Problem List   Diagnosis Date Noted  . S/P aortic valve replacement and aortoplasty 04/13/2011  . Thrombocytopenia ([place]) 03/23/2011  . Atrial fibrillation ([place]) 03/22/2011  . Carotid artery disease ([place])   . Hypertension   . Colon polyps   . COPD (chronic obstructive pulmonary disease) with emphysema ([place]) 03/02/2011    Prior to Admission medications   Medication Sig Start Date End Date Taking? Authorizing Provider  albuterol (PROVENTIL) (2.5 MG/3ML) 0.083% nebulizer solution Take 3 mLs (2.5 mg total) by nebulization every 4 (four) hours. And as needed for SOB 04/24/15  Yes Collene Gobble, MD  amLODipine (NORVASC) 10 MG tablet Take 1 tablet (10 mg total) by mouth daily. 02/18/15  Yes Belva Crome, MD  aspirin 81 MG tablet Take 81 mg by mouth daily.   Yes Historical Provider, MD  cefdinir (OMNICEF) 300 MG capsule Take 1 capsule (300 mg total) by mouth 2 (two) times daily. 06/08/15  Yes Posey Boyer, MD  cetirizine (ZYRTEC) 10 MG tablet Take 10 mg by mouth daily.     Yes Historical Provider, MD  Ferrous Sulfate  (IRON) 325 (65 FE) MG TABS Take 325 mg by mouth every other day.    Yes Historical Provider, MD  hydrochlorothiazide (HYDRODIURIL) 25 MG tablet Take 1 tablet (25 mg total) by mouth daily. 02/18/15 01/23/18 Yes Belva Crome, MD  levothyroxine (SYNTHROID, LEVOTHROID) 125 MCG tablet Take 125 mcg by mouth daily.   Yes Historical Provider, MD  Multiple Vitamins-Minerals (MULTIVITAMINS THER. W/MINERALS) TABS Take 1 tablet by mouth daily.     Yes Historical Provider, MD  propranolol ER (INDERAL LA) 60 MG 24 hr capsule Take 1 capsule (60 mg total) by mouth daily. 02/18/15  Yes Belva Crome, MD  simvastatin (ZOCOR) 20 MG tablet Take 20 mg by mouth at bedtime.     Yes Historical Provider, MD  STIOLTO RESPIMAT 2.5-2.5 MCG/ACT AERS INHALE TWO PUFFS INTO THE LUNGS ONCE DAILY 06/10/15  Yes Collene Gobble, MD  vitamin D, CHOLECALCIFEROL, 400 UNITS tablet Take 400 Units by mouth daily.     Yes Historical Provider, MD  albuterol (PROVENTIL HFA;VENTOLIN HFA) 108 (90 BASE) MCG/ACT inhaler Inhale 2 puffs into the lungs every 6 (six) hours as needed for wheezing. Patient not taking: Reported on 04/19/2015 04/18/12   Kathee Delton, MD    Allergies  Allergen Reactions  . Clarithromycin     Mouth/throat sore  . Penicillins Itching and Rash    Past Surgical History  Procedure Laterality Date  . Breast lumpectomy      Left  . Foot surgery      Morton's neuroma  . Knee arthroscopy      Left  . Cholecystectomy    . Cataract extraction    . Aortic root replacement  03/18/2011    38m Medtronic Freestyle Porcine aortic root with reimplantation of coronary arteries    Social History  Substance Use Topics  . Smoking status: Former Smoker -- 0.90 packs/day for 62 years    Types: Cigarettes    Quit date: 07/14/2010  . Smokeless tobacco: Never Used  . Alcohol Use: No    Family History  Problem Relation Age of Onset  . Emphysema Maternal Aunt   . Asthma Maternal Aunt   . Asthma Maternal Uncle   . Heart  disease Maternal Grandmother   . Cancer Mother     bladder  . Lung cancer Father   . Breast cancer Sister   . Uterine cancer Sister     Medication list has been reviewed and updated.  Physical Examination:  Physical Exam  Constitutional: She is oriented to person, place, and time. She appears well-developed and well-nourished. No distress.  HENT:  Head: Normocephalic and atraumatic.  Right Ear: Hearing, external ear and ear canal normal.  Left Ear: Hearing, tympanic membrane, external ear and ear canal normal.  Nose: Nose normal.  Mouth/Throat: Uvula is midline, oropharynx is clear and moist and mucous membranes are normal.  Right TM bulging and cloudy. Superior erythema present. Canal normal. No tenderness with manipulation auricle or tragus. Preauricular node swollen and tender. Left TM with scarring only. No pain along gingiva  Eyes: Conjunctivae and lids are normal. Right eye exhibits no discharge. Left eye exhibits no discharge. No scleral icterus.  Cardiovascular: Normal rate, regular rhythm and normal pulses.   Pulmonary/Chest: Effort normal and breath sounds normal. No respiratory distress. She has no wheezes. She has no rhonchi. She has no rales.  Musculoskeletal: Normal range of motion.  Lymphadenopathy:       Head (right side): Preauricular adenopathy present. No submental, no submandibular, no tonsillar and no posterior auricular adenopathy present.       Head (left side): No submental, no submandibular, no tonsillar, no preauricular and no posterior auricular adenopathy present.    She has no cervical adenopathy.  Neurological: She is alert and oriented to person, place, and time.  Skin: Skin is warm, dry and intact. No lesion and no rash noted.  Psychiatric: She has a normal mood and affect. Her speech is normal and behavior is normal. Thought content normal.   BP 120/80 mmHg  Pulse 60  Temp(Src) 98.6 F (37 C) (Oral)  Resp 19  Ht 5' 3.78" (1.62 m)  Wt 125 lb  6.4 oz (56.881 kg)  BMI 21.67 kg/m2  SpO2 94%  Assessment and Plan:  1. Acute right otitis media, recurrence not specified, unspecified otitis media type Will place back on cefdinir for OM. Advised taking probiotic daily to protect gut. Tylenol/ibuprofen for pain. Return in 1 week if symptoms do not improve or at any time if symptoms worsen.  - cefdinir (OMNICEF) 300 MG capsule; Take 1 capsule (300 mg total) by mouth 2 (two) times daily.  Dispense: 20 capsule; Refill: 0   NBenjaman Pott BDrenda Freeze [facility]  06/21/2015

## 2015-07-22 ENCOUNTER — Ambulatory Visit (INDEPENDENT_AMBULATORY_CARE_PROVIDER_SITE_OTHER): Payer: Medicare Other | Admitting: Emergency Medicine

## 2015-07-22 ENCOUNTER — Encounter: Payer: Self-pay | Admitting: Emergency Medicine

## 2015-07-22 VITALS — BP 104/60 | HR 56 | Ht 64.0 in | Wt 121.0 lb

## 2015-07-22 DIAGNOSIS — J441 Chronic obstructive pulmonary disease with (acute) exacerbation: Secondary | ICD-10-CM | POA: Diagnosis not present

## 2015-07-22 MED ORDER — ALBUTEROL SULFATE HFA 108 (90 BASE) MCG/ACT IN AERS: 2.0000 | INHALATION_SPRAY | Freq: Four times a day (QID) | RESPIRATORY_TRACT | Status: DC | PRN

## 2015-07-22 MED ORDER — PREDNISONE 10 MG PO TABS: ORAL_TABLET | ORAL | Status: DC

## 2015-07-22 NOTE — Assessment & Plan Note (Signed)
Acute wheezing and dyspnea consistent with an exacerbation in the setting of a probable upper respirator infection. She empirically took antibiotics that she had at home without any resolution of symptoms. I believe she needs prednisone taper. I'll defer any further antibiotics since she took some already. Continue Stiolto. Order albuterol prn. rov 4 mo or prn.

## 2015-07-22 NOTE — Patient Instructions (Signed)
Please take prednisone as directed until completely gone.  Continue your Stiolto daily We will order albuterol through your pharmacy - Take albuterol 2 puffs up to every 4 hours if needed for shortness of breath.  Please call our office if not getting better in the next week.  Follow with Dr Lamonte Sakai in 4 months or sooner if you have any problems.

## 2015-07-22 NOTE — Progress Notes (Signed)
Subjective:    Patient ID: [name], female    DOB: 1929/05/25, 82 y.o.   MRN: [id number]  HPI 82 year old woman, former smoker (50  Pack years), followed by Dr. Gwenette Greet in the past for COPD. Also with a history of hypertension, aortic valve bioprosthesis. She has been managed on Spiriva, was changed to [place] over a year to see if she would benefit more. She reports exertional SOB, difficulty getting a deep breath walking through the house or on an incline. She has to rest when doing vacuuming. She feels the breathing has been slowly worse.  She has uses albuterol at these times with some improvement.  She was started on propranolol recently, ? Whether this could be a factor.    ROV 07/22/15 -- follow-up visit for COPD, also with a history of aortic valve replacement and hypertension. She was treated with abx for a bronchitis in January. Was well until about 8 days ago. She developed more cough, prod of clear thick. More dyspnea and wheeze. No known sick contacts. Fever once. No aches, but is fatigued.  She is on stiolto, does not have a SABA   Review of Systems  Constitutional: Positive for activity change. Negative for fever and unexpected weight change.  HENT: Negative for congestion, postnasal drip, rhinorrhea and sneezing.   Respiratory: Positive for shortness of breath. Negative for apnea, cough, chest tightness, wheezing and stridor.    As per HPI  Past Medical History  Diagnosis Date  . Hypertension   . Osteopenia   . Hypothyroid   . Aortic stenosis   . Carotid artery disease ([place])   . Hypercalcemia   . Pressure urticaria   . Allergic rhinitis   . RLS (restless legs syndrome)   . COPD (chronic obstructive pulmonary disease) ([place])   . Colon polyps   . AS (aortic stenosis) 03/06/2011  . S/P aortic valve replacement with stentless valve 03/18/2011     Family History  Problem Relation Age of Onset  . Emphysema Maternal Aunt   . Asthma Maternal Aunt   . Asthma Maternal  Uncle   . Heart disease Maternal Grandmother   . Cancer Mother     bladder  . Lung cancer Father   . Breast cancer Sister   . Uterine cancer Sister      Social History   Social History  . Marital Status: Widowed    Spouse Name: N/A  . Number of Children: N  . Years of Education: N/A   Occupational History  . housewife    Social History Main Topics  . Smoking status: Former Smoker -- 0.90 packs/day for 62 years    Types: Cigarettes    Quit date: 07/14/2010  . Smokeless tobacco: Never Used  . Alcohol Use: No  . Drug Use: No  . Sexual Activity: Not on file   Other Topics Concern  . Not on file   Social History Narrative     Allergies  Allergen Reactions  . Clarithromycin     Mouth/throat sore  . Penicillins Itching and Rash     Outpatient Prescriptions Prior to Visit  Medication Sig Dispense Refill  . albuterol (PROVENTIL) (2.5 MG/3ML) 0.083% nebulizer solution Take 3 mLs (2.5 mg total) by nebulization every 4 (four) hours. And as needed for SOB 300 mL 5  . amLODipine (NORVASC) 10 MG tablet Take 1 tablet (10 mg total) by mouth daily. 90 tablet 3  . aspirin 81 MG tablet Take 81 mg by mouth daily.    Marland Kitchen  cefdinir (OMNICEF) 300 MG capsule Take 1 capsule (300 mg total) by mouth 2 (two) times daily. 20 capsule 0  . cetirizine (ZYRTEC) 10 MG tablet Take 10 mg by mouth daily.      . Ferrous Sulfate (IRON) 325 (65 FE) MG TABS Take 325 mg by mouth every other day.     . hydrochlorothiazide (HYDRODIURIL) 25 MG tablet Take 1 tablet (25 mg total) by mouth daily. 90 tablet 3  . levothyroxine (SYNTHROID, LEVOTHROID) 125 MCG tablet Take 125 mcg by mouth daily.    . Multiple Vitamins-Minerals (MULTIVITAMINS THER. W/MINERALS) TABS Take 1 tablet by mouth daily.      . propranolol ER (INDERAL LA) 60 MG 24 hr capsule Take 1 capsule (60 mg total) by mouth daily. 180 capsule 3  . simvastatin (ZOCOR) 20 MG tablet Take 20 mg by mouth at bedtime.      Marland Kitchen STIOLTO RESPIMAT 2.5-2.5 MCG/ACT AERS  INHALE TWO PUFFS INTO THE LUNGS ONCE DAILY 4 g 5  . vitamin D, CHOLECALCIFEROL, 400 UNITS tablet Take 400 Units by mouth daily.      Marland Kitchen albuterol (PROVENTIL HFA;VENTOLIN HFA) 108 (90 BASE) MCG/ACT inhaler Inhale 2 puffs into the lungs every 6 (six) hours as needed for wheezing. (Patient not taking: Reported on 04/19/2015) 1 Inhaler 0   No facility-administered medications prior to visit.          Objective:   Physical Exam Filed Vitals:   07/22/15 1111  BP: 104/60  Pulse: 56  Height: _0  (1.626 m)  Weight: 121 lb (54.885 kg)  SpO2: 90%   Gen: Pleasant, thin woman,  in no distress,  normal affect  ENT: No lesions,  mouth clear,  oropharynx clear, no postnasal drip  Neck: No JVD, no TMG, no carotid bruits  Lungs: No use of accessory muscles, distant, clear without rales or rhonchi  Cardiovascular: RRR, heart sounds normal, no murmur or gallops, no peripheral edema  Musculoskeletal: No deformities, no cyanosis or clubbing  Neuro: alert, non focal  Skin: Warm, no lesions or rashes      Assessment & Plan:  COPD with acute exacerbation (HCC) Acute wheezing and dyspnea consistent with an exacerbation in the setting of a probable upper respirator infection. She empirically took antibiotics that she had at home without any resolution of symptoms. I believe she needs prednisone taper. I'll defer any further antibiotics since she took some already. Continue Stiolto. Order albuterol prn. rov 4 mo or prn.

## 2015-08-15 ENCOUNTER — Telehealth: Payer: Self-pay | Admitting: Emergency Medicine

## 2015-08-15 NOTE — Telephone Encounter (Signed)
Increased SOB x2-3 days, with wheezing and chest tightness.  Some cough with clear bubbly mucus.  Pt denies any f/c/s, hemoptysis. Appt scheduled with SG for tomorrow 3.31 @ 1030 Pt is aware to seek emergency attention if her symptoms worsen overnight Nothing further needed; will sign off

## 2015-08-16 ENCOUNTER — Encounter: Payer: Self-pay | Admitting: Acute Care

## 2015-08-16 ENCOUNTER — Ambulatory Visit (INDEPENDENT_AMBULATORY_CARE_PROVIDER_SITE_OTHER): Payer: Medicare Other | Admitting: Acute Care

## 2015-08-16 ENCOUNTER — Ambulatory Visit (INDEPENDENT_AMBULATORY_CARE_PROVIDER_SITE_OTHER)
Admission: RE | Admit: 2015-08-16 | Discharge: 2015-08-16 | Disposition: A | Discharge status: Home / Self Care | Payer: Medicare Other | Source: Ambulatory Visit | Attending: Acute Care | Admitting: Acute Care

## 2015-08-16 ENCOUNTER — Telehealth: Payer: Self-pay | Admitting: Acute Care

## 2015-08-16 VITALS — BP 100/60 | HR 60 | Temp 97.4°F | Ht 63.75 in | Wt 123.0 lb

## 2015-08-16 DIAGNOSIS — J438 Other emphysema: Secondary | ICD-10-CM

## 2015-08-16 DIAGNOSIS — J441 Chronic obstructive pulmonary disease with (acute) exacerbation: Secondary | ICD-10-CM | POA: Diagnosis not present

## 2015-08-16 MED ORDER — PREDNISONE 10 MG PO TABS: ORAL_TABLET | ORAL | Status: DC

## 2015-08-16 NOTE — Telephone Encounter (Signed)
New order has been placed. Looks like Judson Roch has also closed OV note from today. Called made Melissa aware. Nothing further needed

## 2015-08-16 NOTE — Assessment & Plan Note (Signed)
Continued worsening exertional dyspnea despite use of Nebs for better BD delivery. Saturation drop to 87% with walking today Plan: We will call in a Prednisone taper; 10 mg tablets: 4 tabs x 2 days, 3 tabs x 2 days, 2 tabs x 2 days 1 tab x 2 days then stop. We will do a walk in the office today to check for desaturation. We will set you up for home oxygen. Schedule for O and O Continue your Stiato 2 puffs into your lungs once daily. Continue using your nebulizer and rescue inhaler every 4 hours as needed for shortness of breath. Follow up in 1 month ( With Dr. Lamonte Sakai) or sooner as needed  Please contact office for sooner follow up if symptoms do not improve or worsen or seek emergency care

## 2015-08-16 NOTE — Telephone Encounter (Signed)
Called spoke with Melissa. She is needing OV note from today before they can process pt portable O2 referral from today. i advised will inform Judson Roch. Please advise thanks

## 2015-08-16 NOTE — Patient Instructions (Addendum)
It is nice to meet you today. I am sorry you are not feeling well. We will call in a Prednisone taper; 10 mg tablets: 4 tabs x 2 days, 3 tabs x 2 days, 2 tabs x 2 days 1 tab x 2 days then stop. We will do a walk in the office today to check for desaturation. We will set you up for home oxygen. Continue your Stiato 2 puffs into your lungs once daily. Continue using your nebulizer and rescue inhaler every 4 hours as needed for shortness of breath. Please contact office for sooner follow up if symptoms do not improve or worsen or seek emergency care

## 2015-08-16 NOTE — Progress Notes (Signed)
Subjective:    Patient ID: [name], female    DOB: 04-13-1930, 82 y.o.   MRN: [id number]  HPI 82 year old woman, former smoker (50 Pack years), followed by Dr. Gwenette Greet in the past for COPD. Also with a history of hypertension, aortic valve bioprosthesis. She has been managed on Spiriva, was changed to [place] over a year to see if she would benefit more. She reports exertional SOB, difficulty getting a deep breath walking through the house or on an incline. She has to rest when doing vacuuming. She feels the breathing has been slowly worse. She has uses albuterol at these times with some improvement. She was started on propranolol recently, ? Whether this could be a factor.  08/16/15:   EXAM: CHEST 2 VIEW ( reviewed personally by me)  COMPARISON: 05/13/2011. 03/18/2011.  FINDINGS: Mediastinum hilar structures are normal. Prior CABG. Cardiomegaly. No focal infiltrate. COPD . Mild basilar pleural thickening noted consistent scarring. Stable apical thickening noted consistent scarring. Diffuse osteopenia degenerative change thoracic spine.  IMPRESSION: 1. COPD. No acute pulmonary disease.  2. Prior CABG. Stable cardiomegaly. No CHF.  08/16/15: Acute OV:  Pt. Presents to the office today with no significant improvement in her SOB on exertion. She states she rarely coughs, and any secretions she has are clear. She has been using her nebulized albuterol with no significant difference in her symptoms. Suspect worsening of her underlying COPD. Dr. Lamonte Sakai had stated in his last note that she may be nearing need for oxygen. We walked her in the office today where she desaturated to 87%, which qualifies her for home oxygen. We will plan on ordering oxygen for her dyspnea/ exertional SOB. Additionally will try a prednisone taper.  . Current outpatient prescriptions:  .  albuterol (PROVENTIL HFA;VENTOLIN HFA) 108 (90 Base) MCG/ACT inhaler, Inhale 2 puffs into the lungs every 6  (six) hours as needed for wheezing or shortness of breath., Disp: 1 Inhaler, Rfl: 2 .  albuterol (PROVENTIL) (2.5 MG/3ML) 0.083% nebulizer solution, Take 3 mLs (2.5 mg total) by nebulization every 4 (four) hours. And as needed for SOB, Disp: 300 mL, Rfl: 5 .  amLODipine (NORVASC) 10 MG tablet, Take 1 tablet (10 mg total) by mouth daily., Disp: 90 tablet, Rfl: 3 .  aspirin 81 MG tablet, Take 81 mg by mouth daily., Disp: , Rfl:  .  cetirizine (ZYRTEC) 10 MG tablet, Take 10 mg by mouth daily.  , Disp: , Rfl:  .  Ferrous Sulfate (IRON) 325 (65 FE) MG TABS, Take 325 mg by mouth every other day. , Disp: , Rfl:  .  hydrochlorothiazide (HYDRODIURIL) 25 MG tablet, Take 1 tablet (25 mg total) by mouth daily., Disp: 90 tablet, Rfl: 3 .  levothyroxine (SYNTHROID, LEVOTHROID) 125 MCG tablet, Take 125 mcg by mouth daily., Disp: , Rfl:  .  Multiple Vitamins-Minerals (MULTIVITAMINS THER. W/MINERALS) TABS, Take 1 tablet by mouth daily.  , Disp: , Rfl:  .  propranolol ER (INDERAL LA) 60 MG 24 hr capsule, Take 1 capsule (60 mg total) by mouth daily., Disp: 180 capsule, Rfl: 3 .  simvastatin (ZOCOR) 20 MG tablet, Take 20 mg by mouth at bedtime.  , Disp: , Rfl:  .  STIOLTO RESPIMAT 2.5-2.5 MCG/ACT AERS, INHALE TWO PUFFS INTO THE LUNGS ONCE DAILY, Disp: 4 g, Rfl: 5 .  Vitamin D, Cholecalciferol, 400 units TABS, Take 1 tablet by mouth daily., Disp: , Rfl:  .  predniSONE (DELTASONE) 10 MG tablet, Take 4 tabs x 2 days,  3 tabs x 2 days, 2 tabs x 2 days, 1 tab x 2 days, Disp: 20 tablet, Rfl: 0   Past Medical History  Diagnosis Date  . Hypertension   . Osteopenia   . Hypothyroid   . Aortic stenosis   . Carotid artery disease ([place])   . Hypercalcemia   . Pressure urticaria   . Allergic rhinitis   . RLS (restless legs syndrome)   . COPD (chronic obstructive pulmonary disease) ([place])   . Colon polyps   . AS (aortic stenosis) 03/06/2011  . S/P aortic valve replacement with stentless valve 03/18/2011    Allergies    Allergen Reactions  . Clarithromycin     Mouth/throat sore  . Penicillins Itching and Rash    Review of Systems Constitutional:   No  weight loss, night sweats,  Fevers, chills, fatigue, or  lassitude.  HEENT:   No headaches,  Difficulty swallowing,  Tooth/dental problems, or  Sore throat,                No sneezing, itching, ear ache, nasal congestion, post nasal drip,   CV:  No chest pain,  Orthopnea, PND, swelling in lower extremities, anasarca, dizziness, palpitations, syncope.   GI  No heartburn, indigestion, abdominal pain, nausea, vomiting, diarrhea, change in bowel habits, loss of appetite, bloody stools.   Resp: + shortness of breath with exertion or at rest.  No excess mucus, no productive cough,  No non-productive cough,  No coughing up of blood.  No change in color of mucus.  + wheezing.  No chest wall deformity  Skin: no rash or lesions.  GU: no dysuria, change in color of urine, no urgency or frequency.  No flank pain, no hematuria   MS:  No joint pain or swelling.  No decreased range of motion.  No back pain.  Psych:  No change in mood or affect. No depression or anxiety.  No memory loss.        Objective:   Physical Exam  BP 100/60 mmHg  Pulse 60  Temp(Src) 97.4 F (36.3 C) (Oral)  Ht 5' 3.75" (1.619 m)  Wt 123 lb (55.792 kg)  BMI 21.29 kg/m2  SpO2 90%  Physical Exam:  General- No distress,  A&Ox3, SOB on exertion ENT: No sinus tenderness, TM clear, pale nasal mucosa, no oral exudate,no post nasal drip, no LAN Cardiac: S1, S2, regular rate and rhythm, no murmur Chest: + wheeze/ no rales/ dullness; no accessory muscle use, no nasal flaring, no sternal retractions Abd.: Soft Non-tender Ext: + clubbing cyanosis, edema Neuro:  normal strength Skin: No rashes, warm and dry Psych: normal mood and behavior    Magdalen Spatz, AGACNP-BC [place] Pager # [phone] 08/16/15 Assessment & Plan:

## 2015-08-19 NOTE — Telephone Encounter (Signed)
Spoke with Melissa, states ov note has been completed and she has everything she needs for this pt's 02 order.  Nothing further needed.

## 2015-09-09 ENCOUNTER — Telehealth: Payer: Self-pay | Admitting: Emergency Medicine

## 2015-09-09 MED ORDER — ALBUTEROL SULFATE (2.5 MG/3ML) 0.083% IN NEBU: 2.5000 mg | INHALATION_SOLUTION | RESPIRATORY_TRACT | Status: DC

## 2015-09-09 NOTE — Telephone Encounter (Signed)
Spoke with pt and clarified that she would like alb nebs sent to [facility]. Rx's sent. Nothing further needed.

## 2015-09-11 ENCOUNTER — Telehealth: Payer: Self-pay | Admitting: Emergency Medicine

## 2015-09-11 MED ORDER — ALBUTEROL SULFATE (2.5 MG/3ML) 0.083% IN NEBU: 2.5000 mg | INHALATION_SOLUTION | RESPIRATORY_TRACT | Status: DC | PRN

## 2015-09-11 NOTE — Telephone Encounter (Signed)
Called and spoke to pt. Pt states she has not received the albuterol neb med from the pharmacy and is requesting a new rx. Advised pt this was phoned in on 4.24.17, pt states she called the pharmacy this morning and they have not received this. New rx sent in to preferred pharmacy. Pt verbalized understanding and denied any further questions or concerns at this time.

## 2015-09-16 ENCOUNTER — Telehealth: Payer: Self-pay | Admitting: Emergency Medicine

## 2015-09-16 NOTE — Telephone Encounter (Signed)
As she is feeling well and has adequate sats on 2.5 lt there is no need to change anything. Please instruct her to call back with any dyspnea, wheezing cough, sputum, fevers, chills. She can keep the Thursday appt with RB.

## 2015-09-16 NOTE — Telephone Encounter (Signed)
Spoke with pt,aware of recs.  Nothing further needed.  

## 2015-09-16 NOTE — Telephone Encounter (Signed)
Called, spoke with pt.  Pt reports o2 sat dropped to 78% on 2lpm last night.  Pt reports she had been moving around her home prior to this reading being taken.  Reports she had chest heaviness, increased SOB, and wheezing during that episode.  No cough. Pt states o2 was increased to 2.5 lpm and she took albuterol neb treatment.  With increased o2, neb tx, and rest, o2 sat increased to 89-91%.  Pt states she is feeling "fine" today.  No chest heaviness, increased SOB, or wheezing now.  Still using o2 at 2.5 lpm with o2 sat 90-93%.  Offered appt today.  Pt states she has a pending appt with RB on Thursday and since she is feeling better today, she is hoping for further recs until appt on Thursday.  Advised RB is off - pt understands and would like msg sent to [facility] - Dr. Vaughan Browner, please advise.  Thank you.

## 2015-09-19 ENCOUNTER — Other Ambulatory Visit (INDEPENDENT_AMBULATORY_CARE_PROVIDER_SITE_OTHER): Payer: Medicare Other

## 2015-09-19 ENCOUNTER — Encounter: Payer: Self-pay | Admitting: Emergency Medicine

## 2015-09-19 ENCOUNTER — Ambulatory Visit (INDEPENDENT_AMBULATORY_CARE_PROVIDER_SITE_OTHER): Payer: Medicare Other | Admitting: Emergency Medicine

## 2015-09-19 VITALS — BP 112/64 | HR 60 | Ht 64.75 in | Wt 120.0 lb

## 2015-09-19 DIAGNOSIS — J438 Other emphysema: Secondary | ICD-10-CM

## 2015-09-19 LAB — IBC PANEL
IRON: 64 ug/dL (ref 42–145)
Saturation Ratios: 19.7 % — ABNORMAL LOW (ref 20.0–50.0)
TRANSFERRIN: 232 mg/dL (ref 212.0–360.0)

## 2015-09-19 LAB — CBC WITH DIFFERENTIAL/PLATELET
BASOS ABS: 0 10*3/uL (ref 0.0–0.1)
BASOS PCT: 0.4 % (ref 0.0–3.0)
EOS ABS: 0.3 10*3/uL (ref 0.0–0.7)
Eosinophils Relative: 3.3 % (ref 0.0–5.0)
HEMATOCRIT: 44.7 % (ref 36.0–46.0)
HEMOGLOBIN: 15.1 g/dL — AB (ref 12.0–15.0)
LYMPHS PCT: 19.8 % (ref 12.0–46.0)
Lymphs Abs: 1.9 10*3/uL (ref 0.7–4.0)
MCHC: 33.8 g/dL (ref 30.0–36.0)
MCV: 95.7 fl (ref 78.0–100.0)
MONO ABS: 1 10*3/uL (ref 0.1–1.0)
Monocytes Relative: 10.3 % (ref 3.0–12.0)
Neutro Abs: 6.4 10*3/uL (ref 1.4–7.7)
Neutrophils Relative %: 66.2 % (ref 43.0–77.0)
Platelets: 266 10*3/uL (ref 150.0–400.0)
RBC: 4.67 Mil/uL (ref 3.87–5.11)
RDW: 13.2 % (ref 11.5–15.5)
WBC: 9.6 10*3/uL (ref 4.0–10.5)

## 2015-09-19 NOTE — Assessment & Plan Note (Addendum)
With continued dyspnea and with some documented exertional desaturations at home even on 2-3 L/m. Not clear to me whether there are other triggers to her dyspnea. She does believe that Stiolto helps her. She has been experiencing dark stools and I'm concerned that she may have melanoma (note she is on iron ). We will check a CBC and an iron panel and have her follow-up here in 2 weeks. I will also check an exertional oximetry to ensure that 3 L/m pulse is adequate to keep her saturations greater than 90%. It may be that she needs to have this increased or she needs to be on continuous flow. Otherwise continue her current medical regimen. We also discussed possible pulmonary rehabilitation. She is still thinking about whether she wants to do this.

## 2015-09-19 NOTE — Patient Instructions (Addendum)
Walking oximetry today on 3L/min pulsed.  Please continue your Stiolto as you are using it Keep albuterol nebulizers available to use as needed for shortness of breath.  We will perform blood work today.   Follow with S Groce in 2 weeks.  Follow with Dr Lamonte Sakai in 2 months or sooner if you have any problems.

## 2015-09-19 NOTE — Addendum Note (Signed)
Addended by: Desmond Dike C on: 09/19/2015 04:20 PM   Modules accepted: Orders, SmartSet

## 2015-09-19 NOTE — Progress Notes (Signed)
Subjective:    Patient ID: [name], female    DOB: Feb 22, 1930, 82 y.o.   MRN: [id number]  HPI 82 year old woman, former smoker (50  Pack years), followed by Dr. Gwenette Greet in the past for COPD. Also with a history of hypertension, aortic valve bioprosthesis. She has been managed on Spiriva, was changed to [place] over a year to see if she would benefit more. She reports exertional SOB, difficulty getting a deep breath walking through the house or on an incline. She has to rest when doing vacuuming. She feels the breathing has been slowly worse.  She has uses albuterol at these times with some improvement.  She was started on propranolol recently, ? Whether this could be a factor.    ROV 07/22/15 -- follow-up visit for COPD, also with a history of aortic valve replacement and hypertension. She was treated with abx for a bronchitis in January. Was well until about 8 days ago. She developed more cough, prod of clear thick. More dyspnea and wheeze. No known sick contacts. Fever once. No aches, but is fatigued.  She is on stiolto, does not have a SABA  ROV 09/19/15 -- Follow-up visit for COPD with frequent exacerbations. She was seen at the end of March in our office with continued dyspnea and found to have exertional hypoxemia. She was started on oxygen at that time. She is currently managed on Stiolto and albuterol as needed. She was treated with pred end of March. She is wearing O2 reliably at 3L/min, has had some desaturations. Remains dyspneic, has had some wheeze. Minimal cough. No mucous. No resting dyspnea. Uses albuterol several times a day.    Review of Systems  Constitutional: Positive for activity change. Negative for fever and unexpected weight change.  HENT: Negative for congestion, postnasal drip, rhinorrhea and sneezing.   Respiratory: Positive for shortness of breath. Negative for apnea, cough, chest tightness, wheezing and stridor.    As per HPI  Past Medical History  Diagnosis Date   . Hypertension   . Osteopenia   . Hypothyroid   . Aortic stenosis   . Carotid artery disease ([place])   . Hypercalcemia   . Pressure urticaria   . Allergic rhinitis   . RLS (restless legs syndrome)   . COPD (chronic obstructive pulmonary disease) ([place])   . Colon polyps   . AS (aortic stenosis) 03/06/2011  . S/P aortic valve replacement with stentless valve 03/18/2011     Family History  Problem Relation Age of Onset  . Emphysema Maternal Aunt   . Asthma Maternal Aunt   . Asthma Maternal Uncle   . Heart disease Maternal Grandmother   . Cancer Mother     bladder  . Lung cancer Father   . Breast cancer Sister   . Uterine cancer Sister      Social History   Social History  . Marital Status: Widowed    Spouse Name: N/A  . Number of Children: N  . Years of Education: N/A   Occupational History  . housewife    Social History Main Topics  . Smoking status: Former Smoker -- 0.90 packs/day for 62 years    Types: Cigarettes    Quit date: 07/14/2010  . Smokeless tobacco: Never Used  . Alcohol Use: No  . Drug Use: No  . Sexual Activity: Not on file   Other Topics Concern  . Not on file   Social History Narrative     Allergies  Allergen Reactions  .  Clarithromycin     Mouth/throat sore  . Penicillins Itching and Rash     Outpatient Prescriptions Prior to Visit  Medication Sig Dispense Refill  . albuterol (PROVENTIL HFA;VENTOLIN HFA) 108 (90 Base) MCG/ACT inhaler Inhale 2 puffs into the lungs every 6 (six) hours as needed for wheezing or shortness of breath. 1 Inhaler 2  . albuterol (PROVENTIL) (2.5 MG/3ML) 0.083% nebulizer solution Take 3 mLs (2.5 mg total) by nebulization every 4 (four) hours as needed for wheezing or shortness of breath. 300 mL 5  . amLODipine (NORVASC) 10 MG tablet Take 1 tablet (10 mg total) by mouth daily. 90 tablet 3  . aspirin 81 MG tablet Take 81 mg by mouth daily.    . cetirizine (ZYRTEC) 10 MG tablet Take 10 mg by mouth daily.      .  Ferrous Sulfate (IRON) 325 (65 FE) MG TABS Take 325 mg by mouth every other day.     . hydrochlorothiazide (HYDRODIURIL) 25 MG tablet Take 1 tablet (25 mg total) by mouth daily. 90 tablet 3  . levothyroxine (SYNTHROID, LEVOTHROID) 125 MCG tablet Take 125 mcg by mouth daily.    . Multiple Vitamins-Minerals (MULTIVITAMINS THER. W/MINERALS) TABS Take 1 tablet by mouth daily.      . propranolol ER (INDERAL LA) 60 MG 24 hr capsule Take 1 capsule (60 mg total) by mouth daily. 180 capsule 3  . simvastatin (ZOCOR) 20 MG tablet Take 20 mg by mouth at bedtime.      Marland Kitchen STIOLTO RESPIMAT 2.5-2.5 MCG/ACT AERS INHALE TWO PUFFS INTO THE LUNGS ONCE DAILY 4 g 5  . Vitamin D, Cholecalciferol, 400 units TABS Take 1 tablet by mouth daily.    . predniSONE (DELTASONE) 10 MG tablet Take 4 tabs x 2 days, 3 tabs x 2 days, 2 tabs x 2 days, 1 tab x 2 days 20 tablet 0   No facility-administered medications prior to visit.          Objective:   Physical Exam Filed Vitals:   09/19/15 1515 09/19/15 1516  BP:  112/64  Pulse:  60  Height: 5' 4.75" (1.645 m)   Weight: 120 lb (54.432 kg)   SpO2:  96%   Gen: Pleasant, thin woman,  in no distress,  normal affect  ENT: No lesions,  mouth clear,  oropharynx clear, no postnasal drip  Neck: No JVD, no TMG, no carotid bruits  Lungs: No use of accessory muscles, distant, some focal inspiratory squeaks and expiratory wheezes more on the left than on the right  Cardiovascular: RRR, heart sounds normal, no murmur or gallops, no peripheral edema  Musculoskeletal: No deformities, no cyanosis or clubbing  Neuro: alert, non focal  Skin: Warm, no lesions or rashes      Assessment & Plan:  COPD (chronic obstructive pulmonary disease) with emphysema With continued dyspnea and with some documented exertional desaturations at home even on 2-3 L/m. Not clear to me whether there are other triggers to her dyspnea. She does believe that Stiolto helps her. She has been  experiencing dark stools and I'm concerned that she may have melanoma (note she is on iron ). We will check a CBC and an iron panel and have her follow-up here in 2 weeks. I will also check an exertional oximetry to ensure that 3 L/m pulse is adequate to keep her saturations greater than 90%. It may be that she needs to have this increased or she needs to be on continuous flow. Otherwise continue her  current medical regimen. We also discussed possible pulmonary rehabilitation. She is still thinking about whether she wants to do this.

## 2015-09-20 ENCOUNTER — Telehealth: Payer: Self-pay | Admitting: Emergency Medicine

## 2015-09-20 NOTE — Telephone Encounter (Signed)
Signed.

## 2015-09-20 NOTE — Telephone Encounter (Signed)
Will send to [facility] to ensure that order is sent to [facility].

## 2015-09-20 NOTE — Telephone Encounter (Signed)
Per chart order is waiting to be signed. Spoke with pt and she was concerned that [facility] did not have order yet. Advised her that we were waiting on RB to sign and we would send it right over. Pt was concerned that she would not have the correct O2 if she wanted to go out. Advise pt that she could continue to use the 3L pulsed until it was replaced with the 2L continuous. Pt voiced understanding. Routed to [facility] as reminder to sign order.  RB please sign pending order for pt.  Thanks!!!

## 2015-09-23 ENCOUNTER — Telehealth: Payer: Self-pay | Admitting: Emergency Medicine

## 2015-09-23 MED ORDER — ALBUTEROL SULFATE HFA 108 (90 BASE) MCG/ACT IN AERS: 2.0000 | INHALATION_SPRAY | Freq: Four times a day (QID) | RESPIRATORY_TRACT | Status: DC | PRN

## 2015-09-23 NOTE — Telephone Encounter (Signed)
Spoke with pt.  She needs Albuterol refilled but needs Ventolin or Proair.  Rx sent with request  To fill as Ventolin.

## 2015-09-23 NOTE — Telephone Encounter (Signed)
Order given to [facility] ° °

## 2015-10-03 ENCOUNTER — Ambulatory Visit (INDEPENDENT_AMBULATORY_CARE_PROVIDER_SITE_OTHER): Payer: Medicare Other | Admitting: Acute Care

## 2015-10-03 ENCOUNTER — Encounter: Payer: Self-pay | Admitting: Acute Care

## 2015-10-03 VITALS — BP 106/60 | HR 57 | Temp 98.2°F | Ht 64.75 in | Wt 119.0 lb

## 2015-10-03 DIAGNOSIS — J438 Other emphysema: Secondary | ICD-10-CM | POA: Diagnosis not present

## 2015-10-03 NOTE — Progress Notes (Signed)
History of Present Illness [name] is a 82 y.o. female former smoker ( 50 pack years) with COPD,a history of hypertension, aortic valve bioprosthesis.    10/03/2015  Follow Up visit. Patient presents to the office today for follow-up of visit with Dr. Lamonte Sakai 2 weeks ago. She is better on 2.5 L of continuous oxygen. She has a saturation monitor that she is using to monitor her sats At home.She does continue to have some exertional dyspnea, but states it is better now that she is on the continuous flow oxygen. She has discontinued the supplemental iron that she was taking, and has noted that her stools are no longer dark. She continues to use her Stialto  daily, and is using her rescue albuterol inhaler approximately one to two times daily. She states she uses this only when wheezing. She denies chest pain, fever, increase in sputum, change in color sputum, orthopnea, or hemoptysis. She denies leg or calf pain.She is interested in participation in Pulmonary Rehab.   Past medical hx Past Medical History  Diagnosis Date  . Hypertension   . Osteopenia   . Hypothyroid   . Aortic stenosis   . Carotid artery disease ([place])   . Hypercalcemia   . Pressure urticaria   . Allergic rhinitis   . RLS (restless legs syndrome)   . COPD (chronic obstructive pulmonary disease) ([place])   . Colon polyps   . AS (aortic stenosis) 03/06/2011  . S/P aortic valve replacement with stentless valve 03/18/2011     Past surgical hx, Family hx, Social hx all reviewed.  Current Outpatient Prescriptions on File Prior to Visit  Medication Sig  . albuterol (PROVENTIL HFA;VENTOLIN HFA) 108 (90 Base) MCG/ACT inhaler Inhale 2 puffs into the lungs every 6 (six) hours as needed for wheezing or shortness of breath.  Marland Kitchen albuterol (PROVENTIL) (2.5 MG/3ML) 0.083% nebulizer solution Take 3 mLs (2.5 mg total) by nebulization every 4 (four) hours as needed for wheezing or shortness of breath.  Marland Kitchen amLODipine (NORVASC) 10 MG tablet  Take 1 tablet (10 mg total) by mouth daily.  Marland Kitchen aspirin 81 MG tablet Take 81 mg by mouth daily.  . cetirizine (ZYRTEC) 10 MG tablet Take 10 mg by mouth daily.    . hydrochlorothiazide (HYDRODIURIL) 25 MG tablet Take 1 tablet (25 mg total) by mouth daily.  Marland Kitchen levothyroxine (SYNTHROID, LEVOTHROID) 125 MCG tablet Take 125 mcg by mouth daily.  . Multiple Vitamins-Minerals (MULTIVITAMINS THER. W/MINERALS) TABS Take 1 tablet by mouth daily.    . propranolol ER (INDERAL LA) 60 MG 24 hr capsule Take 1 capsule (60 mg total) by mouth daily.  . simvastatin (ZOCOR) 20 MG tablet Take 20 mg by mouth at bedtime.    Marland Kitchen STIOLTO RESPIMAT 2.5-2.5 MCG/ACT AERS INHALE TWO PUFFS INTO THE LUNGS ONCE DAILY  . UNABLE TO FIND Take 1 tablet by mouth daily. Med Name: Red Chinese Ginseng 634m   No current facility-administered medications on file prior to visit.     Allergies  Allergen Reactions  . Clarithromycin     Mouth/throat sore  . Penicillins Itching and Rash    Review Of Systems:  Constitutional:   No  weight loss, night sweats,  Fevers, chills, fatigue, or  lassitude.  HEENT:   No headaches,  Difficulty swallowing,  Tooth/dental problems, or  Sore throat,                No sneezing, itching, ear ache, nasal congestion, post nasal drip,  CV:  No chest pain,  Orthopnea, PND, swelling in lower extremities, anasarca, dizziness, palpitations, syncope.   GI  No heartburn, indigestion, abdominal pain, nausea, vomiting, diarrhea, change in bowel habits, loss of appetite, bloody stools.   Resp: + shortness of breath with exertion Not  at rest.  No excess mucus, no productive cough,  No non-productive cough,  No coughing up of blood.  No change in color of mucus.  No wheezing.  No chest wall deformity  Skin: no rash or lesions.  GU: no dysuria, change in color of urine, no urgency or frequency.  No flank pain, no hematuria   MS:  No joint pain or swelling.  No decreased range of motion.  No back  pain.  Psych:  No change in mood or affect. No depression or anxiety.  No memory loss.   Vital Signs BP 106/60 mmHg  Pulse 57  Temp(Src) 98.2 F (36.8 C) (Oral)  Ht 5' 4.75" (1.645 m)  Wt 119 lb (53.978 kg)  BMI 19.95 kg/m2  SpO2 95%   Physical Exam:  General- No distress,  A&Ox3, very pleasant ENT: No sinus tenderness, TM clear, pale nasal mucosa, no oral exudate,no post nasal drip, no LAN Cardiac: S1, S2, regular rate and rhythm, no murmur Chest: No wheeze/ rales/ dullness; no accessory muscle use, no nasal flaring, no sternal retractions, + diminished per bases. Abd.: Soft Non-tender Ext: No clubbing cyanosis, edema Neuro:  normal strength Skin: No rashes, warm and dry Psych: normal mood and behavior   Assessment/Plan  COPD (chronic obstructive pulmonary disease) with emphysema Resolving dyspnea with change from pulsed to continuous oxygen Pt. Is using continuous oxygen at 2.5 L, Saturation today was 95% on 2.5 L. Plan: Continue wearing continuous oxygen at 2.5 L/ min. We will refer you for pulmonary rehab If your need repeat PFT's we will order them. Follow up with Dr. Lamonte Sakai July 17th, 2017 as is already scheduled. Please contact office for sooner follow up if symptoms do not improve or worsen or seek emergency care      Magdalen Spatz, NP 10/03/2015  1:24 PM

## 2015-10-03 NOTE — Patient Instructions (Addendum)
It is nice to see you again. I am glad the continuous oxygen has helped with your shortness of breath. Continue wearing continuous oxygen at 2.5 L/m. We will refer you for pulmonary rehab If your need repeat PFT's we will order them. Follow up with Dr. Lamonte Sakai July 17th, 2017 as is already scheduled. Please contact office for sooner follow up if symptoms do not improve or worsen or seek emergency care

## 2015-10-03 NOTE — Assessment & Plan Note (Addendum)
Resolving dyspnea with change from pulsed to continuous oxygen Pt. Is using continuous oxygen at 2.5 L, Saturation today was 95% on 2.5 L. Plan: Continue wearing continuous oxygen at 2.5 L/ min. We will refer you for pulmonary rehab If your need repeat PFT's we will order them. Follow up with Dr. Lamonte Sakai July 17th, 2017 as is already scheduled. Please contact office for sooner follow up if symptoms do not improve or worsen or seek emergency care

## 2015-10-08 ENCOUNTER — Encounter: Payer: Self-pay | Admitting: Family Medicine

## 2015-10-08 ENCOUNTER — Ambulatory Visit (INDEPENDENT_AMBULATORY_CARE_PROVIDER_SITE_OTHER): Payer: Medicare Other | Admitting: Family Medicine

## 2015-10-08 VITALS — BP 109/56 | HR 62 | Ht 64.25 in | Wt 120.0 lb

## 2015-10-08 DIAGNOSIS — N3001 Acute cystitis with hematuria: Secondary | ICD-10-CM

## 2015-10-08 DIAGNOSIS — R350 Frequency of micturition: Secondary | ICD-10-CM | POA: Diagnosis not present

## 2015-10-08 LAB — POCT URINALYSIS DIPSTICK
BILIRUBIN UA: NEGATIVE
Glucose, UA: NEGATIVE
Ketones, UA: NEGATIVE
NITRITE UA: NEGATIVE
PH UA: 7
PROTEIN UA: NEGATIVE
Spec Grav, UA: 1.02
UROBILINOGEN UA: 0.2

## 2015-10-08 MED ORDER — NITROFURANTOIN MONOHYD MACRO 100 MG PO CAPS: 100.0000 mg | ORAL_CAPSULE | Freq: Two times a day (BID) | ORAL | Status: DC

## 2015-10-08 NOTE — Assessment & Plan Note (Addendum)
Take Macrobid antibiotics, increase water intake, routine couseling done regarding illness and treatments.  May use Tylenol or AZO OTC as needed if she develops dysuria.  Red flag symptoms discussed with patient.  Should have urine rechecked in 4-6 weeks for clearance of hematuria.

## 2015-10-08 NOTE — Patient Instructions (Signed)
Take all antibiotics Nitrofurantoin tablets or capsules What is this medicine? NITROFURANTOIN (nye troe fyoor AN toyn) is an antibiotic. It is used to treat urinary tract infections. This medicine may be used for other purposes; ask your health care provider or pharmacist if you have questions. What should I tell my health care provider before I take this medicine? They need to know if you have any of these conditions: -anemia -diabetes -glucose-6-phosphate dehydrogenase deficiency -kidney disease -liver disease -lung disease -other chronic illness -an unusual or allergic reaction to nitrofurantoin, other antibiotics, other medicines, foods, dyes or preservatives -pregnant or trying to get pregnant -breast-feeding How should I use this medicine? Take this medicine by mouth with a glass of water. Follow the directions on the prescription label. Take this medicine with food or milk. Take your doses at regular intervals. Do not take your medicine more often than directed. Do not stop taking except on your doctor's advice. Talk to your pediatrician regarding the use of this medicine in children. While this drug may be prescribed for selected conditions, precautions do apply. Overdosage: If you think you have taken too much of this medicine contact a poison control center or emergency room at once. NOTE: This medicine is only for you. Do not share this medicine with others. What if I miss a dose? If you miss a dose, take it as soon as you can. If it is almost time for your next dose, take only that dose. Do not take double or extra doses. What may interact with this medicine? -antacids containing magnesium trisilicate -probenecid -quinolone antibiotics like ciprofloxacin, lomefloxacin, norfloxacin and ofloxacin -sulfinpyrazone This list may not describe all possible interactions. Give your health care provider a list of all the medicines, herbs, non-prescription drugs, or dietary supplements  you use. Also tell them if you smoke, drink alcohol, or use illegal drugs. Some items may interact with your medicine. What should I watch for while using this medicine? Tell your doctor or health care professional if your symptoms do not improve or if you get new symptoms. Drink several glasses of water a day. If you are taking this medicine for a long time, visit your doctor for regular checks on your progress. If you are diabetic, you may get a false positive result for sugar in your urine with certain brands of urine tests. Check with your doctor. What side effects may I notice from receiving this medicine? Side effects that you should report to your doctor or health care professional as soon as possible: -allergic reactions like skin rash or hives, swelling of the face, lips, or tongue -chest pain -cough -difficulty breathing -dizziness, drowsiness -fever or infection -joint aches or pains -pale or blue-tinted skin -redness, blistering, peeling or loosening of the skin, including inside the mouth -tingling, burning, pain, or numbness in hands or feet -unusual bleeding or bruising -unusually weak or tired -yellowing of eyes or skin Side effects that usually do not require medical attention (report to your doctor or health care professional if they continue or are bothersome): -dark urine -diarrhea -headache -loss of appetite -nausea or vomiting -temporary hair loss This list may not describe all possible side effects. Call your doctor for medical advice about side effects. You may report side effects to FDA at 1-800-FDA-1088. Where should I keep my medicine? Keep out of the reach of children. Store at room temperature between 15 and 30 degrees C (59 and 86 degrees F). Protect from light. Throw away any unused medicine after the  expiration date. NOTE: This sheet is a summary. It may not cover all possible information. If you have questions about this medicine, talk to your doctor,  pharmacist, or health care provider.    2016, Elsevier/Gold Standard. (2007-11-23 15:56:47) Urinary Tract Infection Urinary tract infections (UTIs) can develop anywhere along your urinary tract. Your urinary tract is your body's drainage system for removing wastes and extra water. Your urinary tract includes two kidneys, two ureters, a bladder, and a urethra. Your kidneys are a pair of bean-shaped organs. Each kidney is about the size of your fist. They are located below your ribs, one on each side of your spine. CAUSES Infections are caused by microbes, which are microscopic organisms, including fungi, viruses, and bacteria. These organisms are so small that they can only be seen through a microscope. Bacteria are the microbes that most commonly cause UTIs. SYMPTOMS  Symptoms of UTIs may vary by age and gender of the patient and by the location of the infection. Symptoms in young women typically include a frequent and intense urge to urinate and a painful, burning feeling in the bladder or urethra during urination. Older women and men are more likely to be tired, shaky, and weak and have muscle aches and abdominal pain. A fever may mean the infection is in your kidneys. Other symptoms of a kidney infection include pain in your back or sides below the ribs, nausea, and vomiting. DIAGNOSIS To diagnose a UTI, your caregiver will ask you about your symptoms. Your caregiver will also ask you to provide a urine sample. The urine sample will be tested for bacteria and white blood cells. White blood cells are made by your body to help fight infection. TREATMENT  Typically, UTIs can be treated with medication. Because most UTIs are caused by a bacterial infection, they usually can be treated with the use of antibiotics. The choice of antibiotic and length of treatment depend on your symptoms and the type of bacteria causing your infection. HOME CARE INSTRUCTIONS  If you were prescribed antibiotics, take them  exactly as your caregiver instructs you. Finish the medication even if you feel better after you have only taken some of the medication.  Drink enough water and fluids to keep your urine clear or pale yellow.  Avoid caffeine, tea, and carbonated beverages. They tend to irritate your bladder.  Empty your bladder often. Avoid holding urine for long periods of time.  Empty your bladder before and after sexual intercourse.  After a bowel movement, women should cleanse from front to back. Use each tissue only once. SEEK MEDICAL CARE IF:   You have back pain.  You develop a fever.  Your symptoms do not begin to resolve within 3 days. SEEK IMMEDIATE MEDICAL CARE IF:   You have severe back pain or lower abdominal pain.  You develop chills.  You have nausea or vomiting.  You have continued burning or discomfort with urination. MAKE SURE YOU:   Understand these instructions.  Will watch your condition.  Will get help right away if you are not doing well or get worse.   This information is not intended to replace advice given to you by your health care provider. Make sure you discuss any questions you have with your health care provider.   Document Released: 02/11/2005 Document Revised: 01/23/2015 Document Reviewed: 06/12/2011 Elsevier Interactive Patient Education [employer].

## 2015-10-09 DIAGNOSIS — R350 Frequency of micturition: Secondary | ICD-10-CM | POA: Insufficient documentation

## 2015-10-09 NOTE — Progress Notes (Signed)
[name], D.O. [facility] Location: Primary Care at [facility]     Subjective:    CC: New pt, here to establish care.   HPI: [name] is a pleasant 82 y.o. female who presents to [facility] today for 2 weeks of increased frequency of small amounts of urine and lower abdominal pressure.  Denies urgency, burning, fevers, chills, nausea, vomiting, abdominal pain, flank pain, back pain; has not seen any gross blood.  No history of frequent UTIs, last one as patient recalls was many, many years ago.   Patient sees Dr. Daneen Rich of cardiology, Dr. West Rich of pulmonology on a regular basis. Has PCP currently at [facility] in [place] on [address] but patient would like to switch.  Reviewing patient's chart it appears her blood pressure runs on the lower end of normal and today it is in a "normal" range for her.  She denies any dizziness, or feelings of lightheadedness.  Pulmonary status is stable patient was recently placed on 24 7 oxygen therapy by her pulmonologist. She stopped smoking in 2012 and did smoke a half pack for 60 years. She denies any breathing difficulties or pulmonary symptoms today.  Allergies  Allergen Reactions  . Clarithromycin     Mouth/throat sore  . Penicillins Itching and Rash    Past Medical History  Diagnosis Date  . Hypertension   . Osteopenia   . Hypothyroid   . Aortic stenosis   . Carotid artery disease ([place])   . Hypercalcemia   . Pressure urticaria   . Allergic rhinitis   . RLS (restless legs syndrome)   . COPD (chronic obstructive pulmonary disease) ([place])   . Colon polyps   . AS (aortic stenosis) 03/06/2011  . S/P aortic valve replacement with stentless valve 03/18/2011    Past Surgical History  Procedure Laterality Date  . Breast lumpectomy      Left  . Foot surgery      Morton's neuroma  . Knee arthroscopy      Left  . Cholecystectomy    . Cataract  extraction    . Aortic root replacement  03/18/2011    44m Medtronic Freestyle Porcine aortic root with reimplantation of coronary arteries    History  Drug Use No  ,  History  Alcohol Use No  ,  History  Smoking status  . Former Smoker -- 0.90 packs/day for 62 years  . Types: Cigarettes  . Quit date: 07/14/2010  Smokeless tobacco  . Never Used  ,  History  Sexual Activity  . Sexual Activity: Not on file    Patient's Medications  New Prescriptions   NITROFURANTOIN, MACROCRYSTAL-MONOHYDRATE, (MACROBID) 100 MG CAPSULE    Take 1 capsule (100 mg total) by mouth 2 (two) times daily.  Previous Medications   ALBUTEROL (PROVENTIL HFA;VENTOLIN HFA) 108 (90 BASE) MCG/ACT INHALER    Inhale 2 puffs into the lungs every 6 (six) hours as needed for wheezing or shortness of breath.   ALBUTEROL (PROVENTIL) (2.5 MG/3ML) 0.083% NEBULIZER SOLUTION    Take 3 mLs (2.5 mg total) by nebulization every 4 (four) hours as needed for wheezing or shortness of breath.   AMLODIPINE (NORVASC) 10 MG TABLET    Take 1 tablet (10 mg total) by mouth daily.   ASPIRIN 81 MG TABLET    Take 81 mg by mouth daily.   CETIRIZINE (ZYRTEC) 10 MG TABLET    Take 10 mg by  mouth daily.     HYDROCHLOROTHIAZIDE (HYDRODIURIL) 25 MG TABLET    Take 1 tablet (25 mg total) by mouth daily.   LEVOTHYROXINE (SYNTHROID, LEVOTHROID) 125 MCG TABLET    Take 125 mcg by mouth daily.   MULTIPLE VITAMINS-MINERALS (MULTIVITAMINS THER. W/MINERALS) TABS    Take 1 tablet by mouth daily.     PROPRANOLOL ER (INDERAL LA) 60 MG 24 HR CAPSULE    Take 1 capsule (60 mg total) by mouth daily.   SIMVASTATIN (ZOCOR) 20 MG TABLET    Take 20 mg by mouth at bedtime.     STIOLTO RESPIMAT 2.5-2.5 MCG/ACT AERS    INHALE TWO PUFFS INTO THE LUNGS ONCE DAILY   UNABLE TO FIND    Take 1 tablet by mouth daily. Med Name: Red Chinese Ginseng 628m  Modified Medications   No medications on file  Discontinued Medications   No medications on file    Clarithromycin  and Penicillins    Review of Systems: Full 14 point ROS performed via "adult medical history form".  Negative except for As noted above..   No headache, visual changes, nausea, vomiting, diarrhea, constipation, dizziness, abdominal pain, skin rash, fevers, chills, night sweats, swollen lymph nodes, weight loss, chest pain, body aches, joint swelling, muscle aches, shortness of breath, mood changes, visual or auditory hallucinations.  Objective:   Blood pressure 109/56, pulse 62, height 5' 4.25" (1.632 m), weight 120 lb (54.432 kg), SpO2 93 %.  General: Well Developed, well nourished, and in no acute distress.  Neuro: Alert and oriented x3, extra-ocular muscles intact, sensation grossly intact.  HEENT: Normocephalic, atraumatic, pupils equal round reactive to light, neck supple, No carotid bruit or JVD Skin: no gross suspicious lesions or rashes  Cardiac: Regular rate and rhythm, S1, S2 normal, no murmurs.  Respiratory: Essentially clear to auscultation bilaterally, but significantly decreased aeration globally and increased exhalation phase.. Not using accessory muscles, speaking in full sentences.  Abdominal: Soft, not grossly distended, bowel sounds 4, no guarding rigidity rebound, no suprapubic tenderness Musculoskeletal: No flank tenderness to palpation, back exam- normal for age, Ambulates w/o diff, FROM * 4 ext.  Vasc: less 2 sec cap RF, warm and pink  Psych:  No HI/SI, judgement and insight good.    Impression and Recommendations:    The patient was counselled, risk factors were discussed, anticipatory guidance given. 1) Acute cystitis with hematuria Take Macrobid antibiotics, increase water intake, routine couseling done regarding illness and treatments.  May use Tylenol or AZO OTC as needed if she develops dysuria.  Red flag symptoms discussed with patient.  Should have urine rechecked in 4-6 weeks for clearance of hematuria.

## 2015-10-11 LAB — URINE CULTURE: Colony Count: >=100000

## 2015-10-15 ENCOUNTER — Telehealth: Payer: Self-pay

## 2015-10-15 NOTE — Telephone Encounter (Signed)
Pt informed of results.  Pt expressed understanding and is agreeable.  Charyl Bigger, CMA

## 2015-10-15 NOTE — Telephone Encounter (Signed)
-----  Message from Mellody Dance, DO sent at 10/15/2015 11:16 AM EDT ----- Please call pt and tell her to take all the meds as written. She has +UTI and bacteria in it that will be covered by the meds I gave her.

## 2015-11-01 ENCOUNTER — Ambulatory Visit (INDEPENDENT_AMBULATORY_CARE_PROVIDER_SITE_OTHER): Payer: Medicare Other | Admitting: Adult Health

## 2015-11-01 ENCOUNTER — Encounter: Payer: Self-pay | Admitting: Adult Health

## 2015-11-01 VITALS — BP 90/60 | HR 58 | Ht 64.25 in | Wt 114.0 lb

## 2015-11-01 DIAGNOSIS — J441 Chronic obstructive pulmonary disease with (acute) exacerbation: Secondary | ICD-10-CM | POA: Diagnosis not present

## 2015-11-01 MED ORDER — PREDNISONE 10 MG PO TABS: ORAL_TABLET | ORAL | Status: DC

## 2015-11-01 NOTE — Patient Instructions (Signed)
Prednisone taper over next week.  Mucinex DM Twice daily  As needed  Cough/congestion  Saline nasal rinses .As needed   Please contact office for sooner follow up if symptoms do not improve or worsen or seek emergency care  Follow up Dr. Lamonte Sakai  As planned and As needed

## 2015-11-01 NOTE — Assessment & Plan Note (Signed)
Flare   Plan  Prednisone taper over next week.  Mucinex DM Twice daily  As needed  Cough/congestion  Saline nasal rinses .As needed   Please contact office for sooner follow up if symptoms do not improve or worsen or seek emergency care  Follow up Dr. Lamonte Sakai  As planned and As needed

## 2015-11-01 NOTE — Progress Notes (Signed)
   Subjective:    Patient ID: [name], female    DOB: 04/12/1930, 82 y.o.   MRN: [id number]  HPI 82 year old female former smoker with COPD, oxygen dependent   11/01/2015 Acute OV  Patient presents for an acute office visit. Patient complains of a four-day history of increased wheezing, cough, and minimally productive, chest tightness and shortness of breath  She is and using Mucinex DM without much help. She denies any fever or discolored mucus, chest pain, near PND, calf pain, tenderness, or hemoptysis. Was on antibiotics 3 weeks ago for urinary tract infection. Chest x-ray in March of this year showed COPD changes. On Stiolto daily  Remains on oxygen at 2 L.  Past Medical History  Diagnosis Date  . Hypertension   . Osteopenia   . Hypothyroid   . Aortic stenosis   . Carotid artery disease ([place])   . Hypercalcemia   . Pressure urticaria   . Allergic rhinitis   . RLS (restless legs syndrome)   . COPD (chronic obstructive pulmonary disease) ([place])   . Colon polyps   . AS (aortic stenosis) 03/06/2011  . S/P aortic valve replacement with stentless valve 03/18/2011      Review of Systems Constitutional:   No  weight loss, night sweats,  Fevers, chills,  +fatigue, or  lassitude.  HEENT:   No headaches,  Difficulty swallowing,  Tooth/dental problems, or  Sore throat,                No sneezing, itching, ear ache,  +nasal congestion, post nasal drip,   CV:  No chest pain,  Orthopnea, PND, swelling in lower extremities, anasarca, dizziness, palpitations, syncope.   GI  No heartburn, indigestion, abdominal pain, nausea, vomiting, diarrhea, change in bowel habits, loss of appetite, bloody stools.   Resp:  .  No chest wall deformity  Skin: no rash or lesions.  GU: no dysuria, change in color of urine, no urgency or frequency.  No flank pain, no hematuria   MS:  No joint pain or swelling.  No decreased range of motion.  No back pain.  Psych:  No change in mood or affect.  No depression or anxiety.  No memory loss.          Objective:   Physical Exam   Filed Vitals:   11/01/15 1435  BP: 90/60  Pulse: 58  Height: 5' 4.25" (1.632 m)  Weight: 114 lb (51.71 kg)  SpO2: 100%  GEN: A/Ox3; pleasant , NAD, elderly , on O2   HEENT:  [place]/AT,  EACs-clear, TMs-wnl, NOSE-clear, THROAT-clear, no lesions, no postnasal drip or exudate noted.   NECK:  Supple w/ fair ROM; no JVD; normal carotid impulses w/o bruits; no thyromegaly or nodules palpated; no lymphadenopathy.  RESP  Exp wheeze , no accessory muscle use, no dullness to percussion  CARD:  RRR, no m/r/g  , no peripheral edema, pulses intact, no cyanosis or clubbing., neg calf pain /tenderness   GI:   Soft & nt; nml bowel sounds; no organomegaly or masses detected.  Musco: Warm bil, no deformities or joint swelling noted.   Neuro: alert, no focal deficits noted.    Skin: Warm, no lesions or rashes  Tammy Parrett NP-C  [facility]  11/01/2015      Assessment & Plan:

## 2015-11-05 ENCOUNTER — Ambulatory Visit (INDEPENDENT_AMBULATORY_CARE_PROVIDER_SITE_OTHER): Payer: Medicare Other | Admitting: Family Medicine

## 2015-11-05 ENCOUNTER — Encounter: Payer: Self-pay | Admitting: Family Medicine

## 2015-11-05 ENCOUNTER — Other Ambulatory Visit: Payer: Self-pay

## 2015-11-05 VITALS — BP 116/57 | HR 60 | Ht 64.25 in | Wt 116.9 lb

## 2015-11-05 DIAGNOSIS — J309 Allergic rhinitis, unspecified: Secondary | ICD-10-CM | POA: Insufficient documentation

## 2015-11-05 DIAGNOSIS — W19XXXA Unspecified fall, initial encounter: Secondary | ICD-10-CM | POA: Insufficient documentation

## 2015-11-05 DIAGNOSIS — I10 Essential (primary) hypertension: Secondary | ICD-10-CM

## 2015-11-05 DIAGNOSIS — G2581 Restless legs syndrome: Secondary | ICD-10-CM

## 2015-11-05 DIAGNOSIS — I739 Peripheral vascular disease, unspecified: Secondary | ICD-10-CM

## 2015-11-05 DIAGNOSIS — R319 Hematuria, unspecified: Secondary | ICD-10-CM

## 2015-11-05 DIAGNOSIS — I4891 Unspecified atrial fibrillation: Secondary | ICD-10-CM

## 2015-11-05 DIAGNOSIS — M8580 Other specified disorders of bone density and structure, unspecified site: Secondary | ICD-10-CM | POA: Insufficient documentation

## 2015-11-05 DIAGNOSIS — Z87442 Personal history of urinary calculi: Secondary | ICD-10-CM

## 2015-11-05 DIAGNOSIS — D696 Thrombocytopenia, unspecified: Secondary | ICD-10-CM

## 2015-11-05 DIAGNOSIS — Y92009 Unspecified place in unspecified non-institutional (private) residence as the place of occurrence of the external cause: Secondary | ICD-10-CM

## 2015-11-05 DIAGNOSIS — E039 Hypothyroidism, unspecified: Secondary | ICD-10-CM | POA: Insufficient documentation

## 2015-11-05 DIAGNOSIS — Z9981 Dependence on supplemental oxygen: Secondary | ICD-10-CM

## 2015-11-05 DIAGNOSIS — E038 Other specified hypothyroidism: Secondary | ICD-10-CM

## 2015-11-05 DIAGNOSIS — K635 Polyp of colon: Secondary | ICD-10-CM

## 2015-11-05 DIAGNOSIS — J438 Other emphysema: Secondary | ICD-10-CM

## 2015-11-05 DIAGNOSIS — R350 Frequency of micturition: Secondary | ICD-10-CM

## 2015-11-05 DIAGNOSIS — I779 Disorder of arteries and arterioles, unspecified: Secondary | ICD-10-CM

## 2015-11-05 DIAGNOSIS — G252 Other specified forms of tremor: Secondary | ICD-10-CM | POA: Insufficient documentation

## 2015-11-05 HISTORY — DX: Hematuria, unspecified: R31.9

## 2015-11-05 HISTORY — DX: Dependence on supplemental oxygen: Z99.81

## 2015-11-05 LAB — POCT URINALYSIS DIPSTICK
BILIRUBIN UA: NEGATIVE
Glucose, UA: NEGATIVE
KETONES UA: NEGATIVE
Nitrite, UA: NEGATIVE
PH UA: 7
PROTEIN UA: NEGATIVE
Spec Grav, UA: 1.015
Urobilinogen, UA: 0.2

## 2015-11-05 NOTE — Assessment & Plan Note (Signed)
We'll obtain fasting lipid profile in near future.  Patient remains asymptomatic.

## 2015-11-05 NOTE — Assessment & Plan Note (Signed)
Patient had a history of A. fib.  Just briefly postoperatively after her aortic valve replacement in October 2012.     I do not believe nor does patient think that the propranolol was given for this reason, and according to cardiology notes in October 2016, appears it was given for an intention tremor.  Patient will follow-up with them in Sept/ Oct '17.  Stable o/w; No Sx

## 2015-11-05 NOTE — Assessment & Plan Note (Signed)
Referring patient to urology for her persistent hematuria and sense of incomplete bladder emptying.  She denies that she has ever been seen by urology for these issues.  Referral placed.

## 2015-11-05 NOTE — Assessment & Plan Note (Signed)
Patient will keep visual and understands if we decrease her propranolol.  Her symptoms may return.  We'll address in future as needed.

## 2015-11-05 NOTE — Progress Notes (Signed)
Subjective:    CC:  Here for f/up Urinary Sx and get labs   HPI: [name] is a 82 y.o. female who presents to [facility] today  F/up chronic dx.   Recently seen pt for the 1st time for UTI last visit.  Sx much better- no burning but still just "just doesn't feel right", feel like bladder not emptying completely.  Still inc frequency.  Here for repeat UA to check for clearance of blood.  Pt denies h/o hematuria   BP-  Patient's blood pressure has been running low the last few times I seen her. She denies any dizziness or lightheadedness. She has no orthopnea or history syncope.  Patient sees Dr. Daneen Schick of cardiology,-   Last seen October 2016 and at that time patient was put on propranolol for intentional tremors. 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 Tremors- better.   Dr. Lamonte Sakai of pulmonology on a regular basis- recently seen for COPD exacerbation now still on prednisone.  Patient's breathing is much improved since going on the meds. Pt wants to take it all the time.     Past Medical History  Diagnosis Date  . Hypertension   . Osteopenia   . Hypothyroid   . Aortic stenosis   . Carotid artery disease ([place])   . Hypercalcemia   . Pressure urticaria   . Allergic rhinitis   . RLS (restless legs syndrome)   . COPD (chronic obstructive pulmonary disease) ([place])   . Colon polyps   . AS (aortic stenosis) 03/06/2011  . S/P aortic valve replacement with stentless valve 03/18/2011    Past Surgical History  Procedure Laterality Date  . Breast lumpectomy      Left  . Foot surgery      Morton's neuroma  . Knee arthroscopy      Left  . Cholecystectomy    . Cataract extraction    . Aortic root replacement  03/18/2011    26m Medtronic Freestyle Porcine aortic root with reimplantation of coronary arteries    Family History  Problem Relation Age of Onset  . Emphysema Maternal Aunt     . Asthma Maternal Aunt   . Asthma Maternal Uncle   . Heart disease Maternal Grandmother   . Cancer Mother     bladder  . Lung cancer Father   . Breast cancer Sister   . Uterine cancer Sister     History  Drug Use No  ,  History  Alcohol Use No  ,  History  Smoking status  . Former Smoker -- 0.90 packs/day for 62 years  . Types: Cigarettes  . Quit date: 07/14/2010  Smokeless tobacco  . Never Used  ,  History  Sexual Activity  . Sexual Activity: Not on file    Current Outpatient Prescriptions on File Prior to Visit  Medication Sig Dispense Refill  . albuterol (PROVENTIL HFA;VENTOLIN HFA) 108 (90 Base) MCG/ACT inhaler Inhale 2 puffs into the lungs every 6 (six) hours as needed for wheezing or shortness of breath. 1 Inhaler 2  . albuterol (PROVENTIL) (2.5 MG/3ML) 0.083% nebulizer solution Take 3 mLs (2.5 mg total) by nebulization every 4 (four) hours as needed for wheezing or shortness of breath. 300 mL 5  . amLODipine (NORVASC) 10 MG tablet Take 1 tablet (10 mg total) by mouth daily. 90 tablet 3  . aspirin 81  MG tablet Take 81 mg by mouth daily.    . DiphenhydrAMINE HCl (BENADRYL ALLERGY PO) Take 1 capsule by mouth at bedtime.    . hydrochlorothiazide (HYDRODIURIL) 25 MG tablet Take 1 tablet (25 mg total) by mouth daily. 90 tablet 3  . levothyroxine (SYNTHROID, LEVOTHROID) 125 MCG tablet Take 125 mcg by mouth daily.    . Melatonin 3 MG TABS Take 2 tablets by mouth at bedtime.    . propranolol ER (INDERAL LA) 60 MG 24 hr capsule Take 1 capsule (60 mg total) by mouth daily. 180 capsule 3  . simvastatin (ZOCOR) 20 MG tablet Take 20 mg by mouth at bedtime.      Marland Kitchen STIOLTO RESPIMAT 2.5-2.5 MCG/ACT AERS INHALE TWO PUFFS INTO THE LUNGS ONCE DAILY 4 g 5   No current facility-administered medications on file prior to visit.    Allergies  Allergen Reactions  . Clarithromycin     Mouth/throat sore  . Penicillins Itching and Rash     Review of Systems:  ( Completed via her  adult medical history intake form today ) General:  Denies fever, chills, appetite changes, unexplained weight loss.  Respiratory: Denies SOB, DOE, cough, wheezing any more than her usual.  Cardiovascular: Denies chest pain, palpitations.  Gastrointestinal: Denies nausea, vomiting, diarrhea, abdominal pain.  Genitourinary: Denies dysuria, increased frequency, flank pain. Endocrine: Denies hot or cold intolerance, polyuria, polydipsia. Musculoskeletal: Denies myalgias, back pain, joint swelling, arthralgias, gait problems.  Skin: Denies pallor, rash, suspicious lesions.  Neurological: Denies dizziness, seizures, syncope, unexplained weakness, lightheadedness, numbness and headaches.  Psychiatric/Behavioral: Denies mood changes, suicidal or homicidal ideations, hallucinations, sleep disturbances.   Objective:     Filed Vitals:   11/05/15 1323  Height: 5' 4.25" (1.632 m)  Weight: 116 lb 14.4 oz (53.025 kg)   Blood pressure 116/57, pulse 60, height 5' 4.25" (1.632 m), weight 116 lb 14.4 oz (53.025 kg). Body mass index is 19.91 kg/(m^2).   Reck by me in office manually- 100 /56 General: Well Developed, well nourished, and in no acute distress.  HEENT: Normocephalic, atraumatic, pupils equal round reactive to light, neck supple, No carotid bruits no JVD Skin: Warm and dry, cap RF less 2 sec Cardiac: Regular rate and rhythm, S1, S2 WNL's, no murmurs rubs or gallops Respiratory: ECTA B/L, Not using accessory muscles, speaking in full sentences. NeuroM-Sk: Ambulates w/o assistance, moves ext * 4 w/o difficulty, sensation grossly intact.  Psych: A and O *3, judgement and insight good.   Recent Results (from the past 2160 hour(s))  IBC panel     Status: Abnormal   Collection Time: 09/19/15  4:22 PM  Result Value Ref Range   Iron 64 42 - 145 ug/dL   Transferrin 232.0 212.0 - 360.0 mg/dL   Saturation Ratios 19.7 (L) 20.0 - 50.0 %  CBC w/Diff     Status: Abnormal   Collection Time:  09/19/15  4:22 PM  Result Value Ref Range   WBC 9.6 4.0 - 10.5 K/uL   RBC 4.67 3.87 - 5.11 Mil/uL   Hemoglobin 15.1 (H) 12.0 - 15.0 g/dL   HCT 44.7 36.0 - 46.0 %   MCV 95.7 78.0 - 100.0 fl   MCHC 33.8 30.0 - 36.0 g/dL   RDW 13.2 11.5 - 15.5 %   Platelets 266.0 150.0 - 400.0 K/uL   Neutrophils Relative % 66.2 43.0 - 77.0 %   Lymphocytes Relative 19.8 12.0 - 46.0 %   Monocytes Relative 10.3 3.0 - 12.0 %  Eosinophils Relative 3.3 0.0 - 5.0 %   Basophils Relative 0.4 0.0 - 3.0 %   Neutro Abs 6.4 1.4 - 7.7 K/uL   Lymphs Abs 1.9 0.7 - 4.0 K/uL   Monocytes Absolute 1.0 0.1 - 1.0 K/uL   Eosinophils Absolute 0.3 0.0 - 0.7 K/uL   Basophils Absolute 0.0 0.0 - 0.1 K/uL  POCT Urinalysis Dipstick     Status: Abnormal   Collection Time: 10/08/15  2:38 PM  Result Value Ref Range   Color, UA yellow    Clarity, UA cloudy    Glucose, UA neg    Bilirubin, UA neg    Ketones, UA neg    Spec Grav, UA 1.020    Blood, UA Moderate    pH, UA 7.0    Protein, UA negative    Urobilinogen, UA 0.2    Nitrite, UA negative    Leukocytes, UA large (3+) (A) Negative  Urine Culture     Status: None   Collection Time: 10/08/15  2:44 PM  Result Value Ref Range   Culture ESCHERICHIA COLI    Colony Count >=100,000 COLONIES/ML    Organism ID, Bacteria ESCHERICHIA COLI       Susceptibility   Escherichia coli -  (no method available)    AMPICILLIN <=2 Sensitive     AMOX/CLAVULANIC 4 Sensitive     AMPICILLIN/SULBACTAM <=2 Sensitive     PIP/TAZO <=4 Sensitive     IMIPENEM <=0.25 Sensitive     CEFAZOLIN <=4 Not Reportable     CEFTRIAXONE <=1 Sensitive     CEFTAZIDIME <=1 Sensitive     CEFEPIME <=1 Sensitive     GENTAMICIN <=1 Sensitive     TOBRAMYCIN <=1 Sensitive     CIPROFLOXACIN <=0.25 Sensitive     LEVOFLOXACIN <=0.12 Sensitive     NITROFURANTOIN <=16 Sensitive     TRIMETH/SULFA* <=20 Sensitive      * NR=NOT REPORTABLE,SEE COMMENTORAL therapy:A cefazolin MIC of <32 predicts susceptibility to the oral  agents cefaclor,cefdinir,cefpodoxime,cefprozil,cefuroxime,cephalexin,and loracarbef when used for therapy of uncomplicated UTIs due to E.coli,K.pneumomiae,and P.mirabilis. PARENTERAL therapy: A cefazolinMIC of >8 indicates resistance to parenteralcefazolin. An alternate test method must beperformed to confirm susceptibility to parenteralcefazolin.    Impression and Recommendations:    MMM:  - Fasting blood work in the near future including b12, vit D, etc  Hematuria/ Freq: - refer pt to Urology b/c of her persistent hematuria and sense of incomplete urinating; UA today showed persistent blood  HTN:  -stop propanolol since BP so low and we want to avoid falls - pt understands intentional tremor might come back. We will monitor pt BP at home- write it down.  Cont other BP meds  - drink 1/2 of your weight in ounces of water ( 60oz/d)  Hypertension Continue all blood pressure medicines, stop propranolol. Monitor at home. Routine counseling done  h/o Atrial fibrillation [facility]) Patient had a history of A. fib.  Just briefly postoperatively after her aortic valve replacement in October 2012.     I do not believe nor does patient think that the propranolol was given for this reason, and according to cardiology notes in October 2016, appears it was given for an intention tremor.  Patient will follow-up with them in Sept/ Oct '17.  Stable o/w; No Sx  COPD (chronic obstructive pulmonary disease) with emphysema ([facility]) Patient recently seen by pulmonology and treated for COPD exacerbation.  Tolerated prednisone well and breathing is much improved.  Meds per Pulm  Hypothyroidism; on  synthroid Continue medication, will obtain TSH in near future.  Carotid artery disease ([place]) We'll obtain fasting lipid profile in near future.  Patient remains asymptomatic.  Hematuria Referring patient to urology for her persistent hematuria and sense of incomplete bladder emptying.  She denies that she has ever been seen  by urology for these issues.  Referral placed.  h/o Intention tremor  Patient will keep visual and understands if we decrease her propranolol.  Her symptoms may return.  We'll address in future as needed.   Gross side effects, risk and benefits, and alternatives of medications discussed with patient.  Patient is aware that all medications have potential side effects and we are unable to predict every sideeffect or drug-drug interaction that may occur.  Expresses verbal understanding and consents to current therapy plan and treatment regiment.  Note: This document was prepared using Dragon voice recognition software and may include unintentional dictation errors.

## 2015-11-05 NOTE — Assessment & Plan Note (Signed)
Patient recently seen by pulmonology and treated for COPD exacerbation.  Tolerated prednisone well and breathing is much improved.  Meds per [facility]

## 2015-11-05 NOTE — Assessment & Plan Note (Signed)
Continue medication, will obtain TSH in near future.

## 2015-11-05 NOTE — Assessment & Plan Note (Addendum)
Continue all blood pressure medicines, stop propranolol. Monitor at home. Routine counseling done

## 2015-11-05 NOTE — Patient Instructions (Addendum)
-  Fasting blood work in the near future including b12, vit D, etc - refer pt to Urology b/c of her persistent hematuria(blood in urine) and sense of incomplete urinating/ doesn't feel normal down there.  -stop propanolol since BP so low and we want to avoid falls - pt understands intentional tremor might come back. We will monitor pt BP at home- write it down. - drink 1/2 of your weight in ounces of water ( 60oz/d)     Hypotension (low BP)   As your heart beats, it forces blood through your arteries. This force is your blood pressure. If your blood pressure is too low for you to go about your normal activities or to support the organs of your body, you have hypotension. Hypotension is also referred to as low blood pressure. When your blood pressure becomes too low, you may not get enough blood to your brain. As a result, you may feel weak, feel lightheaded, or develop a rapid heart rate. In a more severe case, you may faint. CAUSES Various conditions can cause hypotension. These include:  Blood loss.  Dehydration.  Heart or endocrine problems.  Pregnancy.  Severe infection.  Not having a well-balanced diet filled with needed nutrients.  Severe allergic reactions (anaphylaxis). Some medicines, such as blood pressure medicine or water pills (diuretics), may lower your blood pressure below normal. Sometimes taking too much medicine or taking medicine not as directed can cause hypotension. TREATMENT  Hospitalization is sometimes required for hypotension if fluid or blood replacement is needed, if time is needed for medicines to wear off, or if further monitoring is needed. Treatment might include changing your diet, changing your medicines (including medicines aimed at raising your blood pressure), and use of support stockings. HOME CARE INSTRUCTIONS   Drink enough fluids to keep your urine clear or pale yellow.  Take your medicines as directed by your health care provider.  Get up  slowly from reclining or sitting positions. This gives your blood pressure a chance to adjust.  Wear support stockings as directed by your health care provider.  Maintain a healthy diet by including nutritious food, such as fruits, vegetables, nuts, whole grains, and lean meats. SEEK MEDICAL CARE IF:  You have vomiting or diarrhea.  You have a fever for more than 2-3 days.  You feel more thirsty than usual.  You feel weak and tired. SEEK IMMEDIATE MEDICAL CARE IF:   You have chest pain or a fast or irregular heartbeat.  You have a loss of feeling in some part of your body, or you lose movement in your arms or legs.  You have trouble speaking.  You become sweaty or feel lightheaded.  You faint. MAKE SURE YOU:   Understand these instructions.  Will watch your condition.  Will get help right away if you are not doing well or get worse.   This information is not intended to replace advice given to you by your health care provider. Make sure you discuss any questions you have with your health care provider.   Document Released: 05/04/2005 Document Revised: 02/22/2013 Document Reviewed: 11/04/2012 Elsevier Interactive Patient Education [employer].

## 2015-11-08 ENCOUNTER — Other Ambulatory Visit (INDEPENDENT_AMBULATORY_CARE_PROVIDER_SITE_OTHER): Payer: Medicare Other

## 2015-11-08 DIAGNOSIS — Z9981 Dependence on supplemental oxygen: Secondary | ICD-10-CM

## 2015-11-08 DIAGNOSIS — I10 Essential (primary) hypertension: Secondary | ICD-10-CM | POA: Diagnosis not present

## 2015-11-08 DIAGNOSIS — M8580 Other specified disorders of bone density and structure, unspecified site: Secondary | ICD-10-CM

## 2015-11-08 DIAGNOSIS — G252 Other specified forms of tremor: Secondary | ICD-10-CM

## 2015-11-08 DIAGNOSIS — I739 Peripheral vascular disease, unspecified: Secondary | ICD-10-CM

## 2015-11-08 DIAGNOSIS — R350 Frequency of micturition: Secondary | ICD-10-CM

## 2015-11-08 DIAGNOSIS — D696 Thrombocytopenia, unspecified: Secondary | ICD-10-CM

## 2015-11-08 DIAGNOSIS — G2581 Restless legs syndrome: Secondary | ICD-10-CM

## 2015-11-08 DIAGNOSIS — I779 Disorder of arteries and arterioles, unspecified: Secondary | ICD-10-CM

## 2015-11-08 DIAGNOSIS — Z87442 Personal history of urinary calculi: Secondary | ICD-10-CM

## 2015-11-08 DIAGNOSIS — R319 Hematuria, unspecified: Secondary | ICD-10-CM

## 2015-11-08 DIAGNOSIS — J438 Other emphysema: Secondary | ICD-10-CM

## 2015-11-08 DIAGNOSIS — E038 Other specified hypothyroidism: Secondary | ICD-10-CM

## 2015-11-08 LAB — CBC
HEMATOCRIT: 43.2 % (ref 35.0–45.0)
HEMOGLOBIN: 14.8 g/dL (ref 11.7–15.5)
MCH: 32.5 pg (ref 27.0–33.0)
MCHC: 34.3 g/dL (ref 32.0–36.0)
MCV: 94.7 fL (ref 80.0–100.0)
MPV: 9.2 fL (ref 7.5–12.5)
Platelets: 219 10*3/uL (ref 140–400)
RBC: 4.56 MIL/uL (ref 3.80–5.10)
RDW: 13.2 % (ref 11.0–15.0)
WBC: 8.8 10*3/uL (ref 3.8–10.8)

## 2015-11-08 LAB — TSH: TSH: 0.91 m[IU]/L

## 2015-11-08 LAB — VITAMIN B12: Vitamin B-12: 933 pg/mL (ref 200–1100)

## 2015-11-09 LAB — COMPREHENSIVE METABOLIC PANEL
ALBUMIN: 3.5 g/dL — AB (ref 3.6–5.1)
ALK PHOS: 49 U/L (ref 33–130)
ALT: 11 U/L (ref 6–29)
AST: 14 U/L (ref 10–35)
BILIRUBIN TOTAL: 0.6 mg/dL (ref 0.2–1.2)
BUN: 14 mg/dL (ref 7–25)
CALCIUM: 9.3 mg/dL (ref 8.6–10.4)
CO2: 33 mmol/L — AB (ref 20–31)
Chloride: 96 mmol/L — ABNORMAL LOW (ref 98–110)
Creat: 0.88 mg/dL (ref 0.60–0.88)
Glucose, Bld: 98 mg/dL (ref 65–99)
POTASSIUM: 3.3 mmol/L — AB (ref 3.5–5.3)
Sodium: 141 mmol/L (ref 135–146)
Total Protein: 6.5 g/dL (ref 6.1–8.1)

## 2015-11-09 LAB — LIPID PANEL
CHOLESTEROL: 133 mg/dL (ref 125–200)
HDL: 63 mg/dL (ref >=46)
LDL Cholesterol: 53 mg/dL (ref <130)
TRIGLYCERIDES: 84 mg/dL (ref <150)
Total CHOL/HDL Ratio: 2.1 Ratio (ref <=5.0)
VLDL: 17 mg/dL (ref <30)

## 2015-11-09 LAB — HEMOGLOBIN A1C
Hgb A1c MFr Bld: 6 % — ABNORMAL HIGH (ref <5.7)
MEAN PLASMA GLUCOSE: 126 mg/dL

## 2015-11-09 LAB — VITAMIN D 25 HYDROXY (VIT D DEFICIENCY, FRACTURES): VIT D 25 HYDROXY: 54 ng/mL (ref 30–100)

## 2015-11-11 ENCOUNTER — Encounter (HOSPITAL_COMMUNITY): Payer: Self-pay

## 2015-11-11 ENCOUNTER — Encounter (HOSPITAL_COMMUNITY)
Admission: RE | Admit: 2015-11-11 | Discharge: 2015-11-11 | Disposition: A | Discharge status: Home / Self Care | Payer: Medicare Other | Source: Ambulatory Visit | Attending: Emergency Medicine | Admitting: Emergency Medicine

## 2015-11-11 VITALS — BP 110/39 | HR 55 | Ht 64.0 in | Wt 112.0 lb

## 2015-11-11 DIAGNOSIS — J438 Other emphysema: Secondary | ICD-10-CM | POA: Insufficient documentation

## 2015-11-11 NOTE — Progress Notes (Signed)
[name] 82 y.o. female Pulmonary Rehab Orientation Note Patient arrived today in Cardiac and Pulmonary Rehab for orientation to Pulmonary Rehab. She was transported from [employer] via wheel chair. She does carry portable oxygen. Per pt, she uses oxygen continuously. Color good, skin warm and dry. Patient is oriented to time and place. Patient's medical history, psychosocial health, and medications reviewed. Psychosocial assessment reveals pt lives alone. Her husband died 55 years ago.  Her 2 sons and granddaughter visit her on a weekly basis on various days.  Pt is currently retired. 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  She feels her exposure to dust and particular matter effected her emphysema.  Pt hobbies include crocheting, crossword puzzles, and solitaire. Pt reports her stress level is low. She says she has no stress in her life. Pt does not exhibit signs of depression. PHQ2/9 score 0/0. Pt shows good  coping skills with positive outlook .Will continue to monitor and evaluate progress toward psychosocial goal(s) of wanting to be able to increase her ability to perform ADL's more more ease and with less effort. Physical assessment reveals heart rate is normal, breath sounds clear to auscultation, no wheezes, rales, or rhonchi. She does have a history of A. Fib.   Grip strength equal, strong. Distal pulses 2+ bilateral posterior tibial pulses present without peripheral edema. Patient reports she does take medications as prescribed. Patient states she follows a Regular diet. The patient reports no specific efforts to gain or lose weight.. She reports losing 14 pounds over the past year or so.  She lives alone and does not enjoy cooking for just herself.  I suggested drinking Boost and Ensure to increase calories and protein sources.   Patient's weight will be monitored closely. Demonstration and practice of PLB using pulse oximeter. Patient able to return  demonstration satisfactorily. Safety and hand hygiene in the exercise area reviewed with patient. Patient voices understanding of the information reviewed. Department expectations discussed with patient and achievable goals were set. The patient shows enthusiasm about attending the program and we look forward to working with this nice lady. The patient is scheduled for a 6 min walk test on Tuesday, November 12, 2015 @ 3:345 pm and to begin exercise on Thursday, November 21, 2015 in the 1:30 pm class.  [phone]

## 2015-11-12 ENCOUNTER — Encounter (HOSPITAL_COMMUNITY)
Admission: RE | Admit: 2015-11-12 | Discharge: 2015-11-12 | Disposition: A | Discharge status: Home / Self Care | Payer: Medicare Other | Source: Ambulatory Visit | Attending: Emergency Medicine | Admitting: Emergency Medicine

## 2015-11-12 DIAGNOSIS — J438 Other emphysema: Secondary | ICD-10-CM

## 2015-11-12 NOTE — Progress Notes (Signed)
Pulmonary Individual Treatment Plan  Patient Details  Name: [name] MRN: [id number] Date of Birth: 11-Feb-1930 Referring Provider:        Pulmonary Rehab Walk Test from 11/12/2015 in [place]   Referring Provider  Dr. Lamonte Sakai      Initial Encounter Date:       Pulmonary Rehab Walk Test from 11/12/2015 in [place]   Date  11/12/15   Referring Provider  Dr. Lamonte Sakai      Visit Diagnosis: Other emphysema ([place])  Patient's Home Medications on Admission:   Current outpatient prescriptions:  .  albuterol (PROVENTIL HFA;VENTOLIN HFA) 108 (90 Base) MCG/ACT inhaler, Inhale 2 puffs into the lungs every 6 (six) hours as needed for wheezing or shortness of breath., Disp: 1 Inhaler, Rfl: 2 .  albuterol (PROVENTIL) (2.5 MG/3ML) 0.083% nebulizer solution, Take 3 mLs (2.5 mg total) by nebulization every 4 (four) hours as needed for wheezing or shortness of breath., Disp: 300 mL, Rfl: 5 .  amLODipine (NORVASC) 10 MG tablet, Take 1 tablet (10 mg total) by mouth daily., Disp: 90 tablet, Rfl: 3 .  aspirin 81 MG tablet, Take 81 mg by mouth daily., Disp: , Rfl:  .  DiphenhydrAMINE HCl (BENADRYL ALLERGY PO), Take 1 capsule by mouth at bedtime., Disp: , Rfl:  .  hydrochlorothiazide (HYDRODIURIL) 25 MG tablet, Take 1 tablet (25 mg total) by mouth daily., Disp: 90 tablet, Rfl: 3 .  levothyroxine (SYNTHROID, LEVOTHROID) 125 MCG tablet, Take 125 mcg by mouth daily., Disp: , Rfl:  .  Melatonin 3 MG TABS, Take 2 tablets by mouth at bedtime., Disp: , Rfl:  .  Multiple Vitamins-Minerals (MULTIVITAMIN WITH MINERALS) tablet, Take 1 tablet by mouth daily., Disp: , Rfl:  .  predniSONE (DELTASONE) 10 MG tablet, Reported on 11/11/2015, Disp: , Rfl:  .  simvastatin (ZOCOR) 20 MG tablet, Take 20 mg by mouth at bedtime.  , Disp: , Rfl:  .  STIOLTO RESPIMAT 2.5-2.5 MCG/ACT AERS, INHALE TWO PUFFS INTO THE LUNGS ONCE DAILY, Disp: 4 g, Rfl: 5  Past Medical  History: Past Medical History  Diagnosis Date  . Hypertension   . Osteopenia   . Hypothyroid   . Aortic stenosis   . Carotid artery disease ([place])   . Hypercalcemia   . Pressure urticaria   . Allergic rhinitis   . RLS (restless legs syndrome)   . Colon polyps   . S/P aortic valve replacement with stentless valve 03/18/2011  . h/o Atrial fibrillation ([place]) 03/22/2011    Brief post op   . Hematuria 11/05/2015  . 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 S/P aortic valve replacement and aortoplasty 04/13/2011    41m Medtronic Freestyle porcine aortic root     Tobacco Use: History  Smoking status  . Former Smoker -- 0.90 packs/day for 62 years  . Types: Cigarettes  . Quit date: 07/14/2010  Smokeless tobacco  . Never Used    Labs: Recent Review Flowsheet Data    Labs for ITP Cardiac and Pulmonary Rehab Latest Ref Rng 03/18/2011 03/19/2011 03/19/2011 03/19/2011 11/08/2015   Cholestrol 125 - 200 mg/dL - - - - 133   LDLCALC <130 mg/dL - - - - 53   HDL >=46 mg/dL - - - - 63   Trlycerides <150 mg/dL - - - - 84   Hemoglobin A1c <5.7 % - - - - 6.0(H)   PHART 7.350 - 7.400 - 7.316(L) 7.295(L) 7.359 -   PCO2ART  35.0 - 45.0 mmHg - 49.2(H) 50.8(H) 43.7 -   HCO3 20.0 - 24.0 mEq/L - 25.0(H) 24.6(H) 24.5(H) -   TCO2 0 - 100 mmol/L _0 -   ACIDBASEDEF 0.0 - 2.0 mmol/L - 1.0 2.0 1.0 -   O2SAT - - 97.0 97.0 99.0 -      Capillary Blood Glucose: Lab Results  Component Value Date   GLUCAP 86 03/24/2011   GLUCAP 123* 03/23/2011   GLUCAP 114* 03/23/2011   GLUCAP 111* 03/23/2011   GLUCAP 88 03/23/2011     ADL UCSD:     Pulmonary Assessment Scores      11/12/15 1442       ADL UCSD   ADL Phase Entry     SOB Score total 61        Pulmonary Function Assessment:   Exercise Target Goals: Date: 11/12/15  Exercise Program Goal: Individual exercise prescription set with THRR, safety & activity barriers. Participant demonstrates ability to understand and report RPE using BORG scale,  to self-measure pulse accurately, and to acknowledge the importance of the exercise prescription.  Exercise Prescription Goal: Starting with aerobic activity 30 plus minutes a day, 3 days per week for initial exercise prescription. Provide home exercise prescription and guidelines that participant acknowledges understanding prior to discharge.  Activity Barriers & Risk Stratification:     Activity Barriers & Cardiac Risk Stratification - 11/11/15 1252    Activity Barriers & Cardiac Risk Stratification   Activity Barriers Shortness of Breath;Deconditioning  has knee pain with peddling a bicycle      6 Minute Walk:     6 Minute Walk      11/12/15 1623       6 Minute Walk   Phase Initial     Distance 1005 feet     Walk Time 6 minutes     # of Rest Breaks 0     MPH 1.9     METS 2.45     RPE 11     Perceived Dyspnea  1     Symptoms No     Resting HR 72 bpm     Resting BP 96/60 mmHg     Max Ex. HR 68 bpm     Max Ex. BP 120/50 mmHg     Interval HR   Baseline HR 72     1 Minute HR 64     2 Minute HR 67     3 Minute HR 67     4 Minute HR 68     5 Minute HR 68     6 Minute HR 68     2 Minute Post HR 60     Interval Heart Rate? Yes     Interval Oxygen   Interval Oxygen? Yes     Baseline Oxygen Saturation % 95 %     Baseline Liters of Oxygen 3 L     1 Minute Oxygen Saturation % 96 %     1 Minute Liters of Oxygen 3 L     2 Minute Oxygen Saturation % 97 %     2 Minute Liters of Oxygen 3 L     3 Minute Oxygen Saturation % 94 %     3 Minute Liters of Oxygen 3 L     4 Minute Oxygen Saturation % 96 %     4 Minute Liters of Oxygen 3 L     5 Minute Oxygen Saturation % 96 %  5 Minute Liters of Oxygen 3 L     6 Minute Oxygen Saturation % 96 %     6 Minute Liters of Oxygen 3 L     2 Minute Post Oxygen Saturation % 97 %     2 Minute Post Liters of Oxygen 3 L        Initial Exercise Prescription:     Initial Exercise Prescription - 11/12/15 1600    Date of Initial  Exercise RX and Referring Provider   Date 11/12/15   Referring Provider Dr. Lamonte Sakai   Oxygen   Oxygen Continuous   Liters 3   NuStep   Level 1   Minutes 15   METs 1.3   Arm/Foot Ergometer   Level 1   Minutes 15   Track   Laps 5   Minutes 15   Prescription Details   Frequency (times per week) 2   Duration Progress to 45 minutes of aerobic exercise without signs/symptoms of physical distress   Intensity   THRR 40-80% of Max Heartrate 55-110   Ratings of Perceived Exertion 11-13   Perceived Dyspnea 0-4   Progression   Progression Continue progressive overload as per policy without signs/symptoms or physical distress.   Resistance Training   Training Prescription Yes   Weight Orange Bands   Reps 10-12      Perform Capillary Blood Glucose checks as needed.  Exercise Prescription Changes:   Exercise Comments:   Discharge Exercise Prescription (Final Exercise Prescription Changes):    Nutrition:  Target Goals: Understanding of nutrition guidelines, daily intake of sodium <151m, cholesterol <2069m calories 30% from fat and 7% or less from saturated fats, daily to have 5 or more servings of fruits and vegetables.  Biometrics:     Pre Biometrics - 11/11/15 1256    Pre Biometrics   Grip Strength 24 kg       Nutrition Therapy Plan and Nutrition Goals:   Nutrition Discharge: Rate Your Plate Scores:   Psychosocial: Target Goals: Acknowledge presence or absence of depression, maximize coping skills, provide positive support system. Participant is able to verbalize types and ability to use techniques and skills needed for reducing stress and depression.  Initial Review & Psychosocial Screening:     Initial Psych Review & Screening - 11/11/15 1302    Initial Review   Current issues with --  none identified   Family Dynamics   Good Support System? Yes   Barriers   Psychosocial barriers to participate in program There are no identifiable barriers or  psychosocial needs.   Screening Interventions   Interventions Encouraged to exercise      Quality of Life Scores:     Quality of Life - 11/12/15 1443    Quality of Life Scores   Health/Function Pre 12.32 %   Socioeconomic Pre 22.5 %   Psych/Spiritual Pre 19.92 %   Family Pre 2 %   GLOBAL Pre 15.18 %      PHQ-9:     Recent Review Flowsheet Data    [facility] 11/11/2015 06/21/2015 06/08/2015 08/10/2011   Decreased Interest 0 0 0 0   Down, Depressed, Hopeless 0 0 0 0   PHQ - 2 Score 0 0 0 0      Psychosocial Evaluation and Intervention:     Psychosocial Evaluation - 11/11/15 1303    Psychosocial Evaluation & Interventions   Interventions Encouraged to exercise with the program and follow exercise prescription   Comments none identified  Continued Psychosocial Services Needed No      Psychosocial Re-Evaluation:  Education: Education Goals: Education classes will be provided on a weekly basis, covering required topics. Participant will state understanding/return demonstration of topics presented.  Learning Barriers/Preferences:     Learning Barriers/Preferences - 11/11/15 1254    Learning Barriers/Preferences   Learning Barriers None   Learning Preferences Written Material;Group Instruction;Skilled Demonstration      Education Topics: Risk Factor Reduction:  -Group instruction that is supported by a PowerPoint presentation. Instructor discusses the definition of a risk factor, different risk factors for pulmonary disease, and how the heart and lungs work together.     Nutrition for Pulmonary Patient:  -Group instruction provided by PowerPoint slides, verbal discussion, and written materials to support subject matter. The instructor gives an explanation and review of healthy diet recommendations, which includes a discussion on weight management, recommendations for fruit and vegetable consumption, as well as protein, fluid, caffeine, fiber, sodium,  sugar, and alcohol. Tips for eating when patients are short of breath are discussed.   Pursed Lip Breathing:  -Group instruction that is supported by demonstration and informational handouts. Instructor discusses the benefits of pursed lip and diaphragmatic breathing and detailed demonstration on how to preform both.     Oxygen Safety:  -Group instruction provided by PowerPoint, verbal discussion, and written material to support subject matter. There is an overview of "What is Oxygen" and "Why do we need it".  Instructor also reviews how to create a safe environment for oxygen use, the importance of using oxygen as prescribed, and the risks of noncompliance. There is a brief discussion on traveling with oxygen and resources the patient may utilize.   Oxygen Equipment:  -Group instruction provided by [facility] Staff utilizing handouts, written materials, and equipment demonstrations.   Signs and Symptoms:  -Group instruction provided by written material and verbal discussion to support subject matter. Warning signs and symptoms of infection, stroke, and heart attack are reviewed and when to call the physician/911 reinforced. Tips for preventing the spread of infection discussed.   Advanced Directives:  -Group instruction provided by verbal instruction and written material to support subject matter. Instructor reviews Advanced Directive laws and proper instruction for filling out document.   Pulmonary Video:  -Group video education that reviews the importance of medication and oxygen compliance, exercise, good nutrition, pulmonary hygiene, and pursed lip and diaphragmatic breathing for the pulmonary patient.   Exercise for the Pulmonary Patient:  -Group instruction that is supported by a PowerPoint presentation. Instructor discusses benefits of exercise, core components of exercise, frequency, duration, and intensity of an exercise routine, importance of utilizing pulse oximetry during  exercise, safety while exercising, and options of places to exercise outside of rehab.     Pulmonary Medications:  -Verbally interactive group education provided by instructor with focus on inhaled medications and proper administration.   Anatomy and Physiology of the Respiratory System and Intimacy:  -Group instruction provided by PowerPoint, verbal discussion, and written material to support subject matter. Instructor reviews respiratory cycle and anatomical components of the respiratory system and their functions. Instructor also reviews differences in obstructive and restrictive respiratory diseases with examples of each. Intimacy, Sex, and Sexuality differences are reviewed with a discussion on how relationships can change when diagnosed with pulmonary disease. Common sexual concerns are reviewed.   Knowledge Questionnaire Score:     Knowledge Questionnaire Score - 11/12/15 1442    Knowledge Questionnaire Score   Pre Score 6/13  Core Components/Risk Factors/Patient Goals at Admission:     Personal Goals and Risk Factors at Admission - 11/11/15 1258    Core Components/Risk Factors/Patient Goals on Admission   Increase Strength and Stamina Yes   Intervention Provide advice, education, support and counseling about physical activity/exercise needs.;Develop an individualized exercise prescription for aerobic and resistive training based on initial evaluation findings, risk stratification, comorbidities and participant's personal goals.   Expected Outcomes Achievement of increased cardiorespiratory fitness and enhanced flexibility, muscular endurance and strength shown through measurements of functional capacity and personal statement of participant.   Improve shortness of breath with ADL's Yes   Intervention Provide education, individualized exercise plan and daily activity instruction to help decrease symptoms of SOB with activities of daily living.   Expected Outcomes Short Term:  Achieves a reduction of symptoms when performing activities of daily living.      Core Components/Risk Factors/Patient Goals Review:      Goals and Risk Factor Review      11/11/15 1300           Core Components/Risk Factors/Patient Goals Review   Personal Goals Review Increase Strength and Stamina;Improve shortness of breath with ADL's       Review increase strength, stamina with exercise in program       Expected Outcomes Improved QOL and ability to perform ADL's with more ease          Core Components/Risk Factors/Patient Goals at Discharge (Final Review):      Goals and Risk Factor Review - 11/11/15 1300    Core Components/Risk Factors/Patient Goals Review   Personal Goals Review Increase Strength and Stamina;Improve shortness of breath with ADL's   Review increase strength, stamina with exercise in program   Expected Outcomes Improved QOL and ability to perform ADL's with more ease      ITP Comments:   Comments:

## 2015-11-13 ENCOUNTER — Encounter: Payer: Self-pay | Admitting: Family Medicine

## 2015-11-13 ENCOUNTER — Ambulatory Visit (INDEPENDENT_AMBULATORY_CARE_PROVIDER_SITE_OTHER): Payer: Medicare Other | Admitting: Family Medicine

## 2015-11-13 VITALS — BP 110/53 | HR 57 | Ht 64.25 in | Wt 114.0 lb

## 2015-11-13 DIAGNOSIS — E038 Other specified hypothyroidism: Secondary | ICD-10-CM | POA: Diagnosis not present

## 2015-11-13 DIAGNOSIS — E559 Vitamin D deficiency, unspecified: Secondary | ICD-10-CM | POA: Diagnosis not present

## 2015-11-13 DIAGNOSIS — I10 Essential (primary) hypertension: Secondary | ICD-10-CM

## 2015-11-13 DIAGNOSIS — R7309 Other abnormal glucose: Secondary | ICD-10-CM | POA: Diagnosis not present

## 2015-11-13 DIAGNOSIS — E569 Vitamin deficiency, unspecified: Secondary | ICD-10-CM | POA: Insufficient documentation

## 2015-11-13 DIAGNOSIS — J438 Other emphysema: Secondary | ICD-10-CM | POA: Diagnosis not present

## 2015-11-13 DIAGNOSIS — R319 Hematuria, unspecified: Secondary | ICD-10-CM

## 2015-11-13 MED ORDER — AMLODIPINE BESYLATE 10 MG PO TABS: 10.0000 mg | ORAL_TABLET | Freq: Every day | ORAL | Status: DC

## 2015-11-13 NOTE — Assessment & Plan Note (Signed)
Stop HCTZ due to too low of BP and also low K. , patient aware that taking off the thiazide diuretic and continuing the amlodipine and B-bl may cause her lower extremity edema to recur.

## 2015-11-13 NOTE — Assessment & Plan Note (Signed)
Likely due to all recent steroids given, and still only 6.0

## 2015-11-13 NOTE — Assessment & Plan Note (Signed)
Stable- no acute changes, starts Pulm Rehab soon

## 2015-11-13 NOTE — Progress Notes (Signed)
Subjective:    Chief Complaint  Patient presents with  . Annual Exam   CC: cpe  HPI: [name] is a 82 y.o. female who presents to [place] at [facility] today a yearly health maintenance exam.    All recent labs drawn on 11/09/2015 were reviewed in person with patient.    Starts pulmonary rehab next week.  She is on 2 L of oxygen consistently throughout the day and night in it is bumped up to 3 at times with activity and as needed.    Patient's blood pressure has been running low for some time now.  Home blood pressures run in the 90s over 40s to the 100s.  Patient continues to be completely asymptomatic with no orthostatic hypotension symptoms.  We are still awaiting the urology consult which will not eat until August. She has no acute urinary complaints today.    Health Maintenance Summary Reviewed and updated, unless pt declines services.  History  Smoking status  . Former Smoker -- 0.90 packs/day for 62 years  . Types: Cigarettes  . Quit date: 07/14/2010  Smokeless tobacco  . Never Used   Alcohol:        No concerns, no excessive use Exercise Habits:     rare STD concerns:     none Drug Use:     None Birth control method:    none Menses regular:      yes Lumps or breast concerns:      no Breast Cancer Family History:      no   Past Medical History  Diagnosis Date  . Hypertension   . Osteopenia   . Hypothyroid   . Aortic stenosis   . Carotid artery disease ([place])   . Hypercalcemia   . Pressure urticaria   . Allergic rhinitis   . RLS (restless legs syndrome)   . Colon polyps   . S/P aortic valve replacement with stentless valve 03/18/2011  . h/o Atrial fibrillation ([place]) 03/22/2011    Brief post op   . Hematuria 11/05/2015  . 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 S/P aortic valve replacement and aortoplasty 04/13/2011    29m Medtronic Freestyle porcine aortic root       Past Surgical History  Procedure Laterality Date  . Breast  lumpectomy      Left  . Foot surgery      Morton's neuroma  . Knee arthroscopy      Left  . Cholecystectomy    . Cataract extraction    . Aortic root replacement  03/18/2011    2101mMedtronic Freestyle Porcine aortic root with reimplantation of coronary arteries      Family History  Problem Relation Age of Onset  . Emphysema Maternal Aunt   . Asthma Maternal Aunt   . Asthma Maternal Uncle   . Heart disease Maternal Grandmother   . Cancer Mother     bladder  . Lung cancer Father   . Breast cancer Sister   . Uterine cancer Sister       History  Drug Use No  ,   History  Alcohol Use No  ,   History  Smoking status  . Former Smoker -- 0.90 packs/day for 62 years  . Types: Cigarettes  . Quit date: 07/14/2010  Smokeless tobacco  . Never Used  ,   History  Sexual Activity  . Sexual Activity: Not on file    Current Outpatient Prescriptions on File  Prior to Visit  Medication Sig Dispense Refill  . albuterol (PROVENTIL HFA;VENTOLIN HFA) 108 (90 Base) MCG/ACT inhaler Inhale 2 puffs into the lungs every 6 (six) hours as needed for wheezing or shortness of breath. 1 Inhaler 2  . albuterol (PROVENTIL) (2.5 MG/3ML) 0.083% nebulizer solution Take 3 mLs (2.5 mg total) by nebulization every 4 (four) hours as needed for wheezing or shortness of breath. 300 mL 5  . aspirin 81 MG tablet Take 81 mg by mouth daily.    . DiphenhydrAMINE HCl (BENADRYL ALLERGY PO) Take 1 capsule by mouth at bedtime.    Marland Kitchen levothyroxine (SYNTHROID, LEVOTHROID) 125 MCG tablet Take 125 mcg by mouth daily.    . Melatonin 3 MG TABS Take 2 tablets by mouth at bedtime.    . Multiple Vitamins-Minerals (MULTIVITAMIN WITH MINERALS) tablet Take 1 tablet by mouth daily.    . predniSONE (DELTASONE) 10 MG tablet Reported on 11/13/2015    . simvastatin (ZOCOR) 20 MG tablet Take 20 mg by mouth at bedtime.      Marland Kitchen STIOLTO RESPIMAT 2.5-2.5 MCG/ACT AERS INHALE TWO PUFFS INTO THE LUNGS ONCE DAILY 4 g 5   No current  facility-administered medications on file prior to visit.     Clarithromycin and Penicillins    Review of Systems:  ( Completed via her adult medical history intake form today ) General:  Denies fever, chills, appetite changes, unexplained weight loss.  Respiratory: Denies SOB, DOE, cough, wheezing.  Cardiovascular: Denies chest pain, palpitations.  Gastrointestinal: Denies nausea, vomiting, diarrhea, abdominal pain.  Genitourinary: Denies dysuria, increased frequency, flank pain. Endocrine: Denies hot or cold intolerance, polyuria, polydipsia. Musculoskeletal: Denies myalgias, back pain, joint swelling, arthralgias, gait problems.  Skin: Denies pallor, rash, suspicious lesions.  Neurological: Denies dizziness, seizures, syncope, unexplained weakness, lightheadedness, numbness and headaches.  Psychiatric/Behavioral: Denies mood changes, suicidal or homicidal ideations, hallucinations, sleep disturbances.   Objective:     Filed Vitals:   11/13/15 1428  Height: 5' 4.25" (1.632 m)  Weight: 114 lb (51.71 kg)   Blood pressure 110/53, pulse 57, height 5' 4.25" (1.632 m), weight 114 lb (51.71 kg). Body mass index is 19.41 kg/(m^2). General Appearance:    Alert, cooperative, no distress, appears stated age  Head:    Normocephalic, without obvious abnormality, atraumatic  Eyes:    PERRL, conjunctiva/corneas clear, EOM's intact, fundi    benign, both eyes  Ears:    Normal TM's and external ear canals, both ears  Nose:   Nares normal, septum midline, mucosa normal, no drainage    or sinus tenderness  Throat:   Lips w/o lesion, mucosa moist, and tongue normal; teeth and   gums normal  Neck:   Supple, symmetrical, trachea midline, no adenopathy;    thyroid:  no enlargement/tenderness/nodules; no carotid   bruit or JVD  Back:     Symmetric, no curvature, ROM normal, no CVA tenderness  Lungs:     Distant, diminished BS with diffusely scattered occ end-exp wh, respirations slightly  labored, no Wh/ R/ R  Chest Wall:    No tenderness or gross deformity; normal excursion   Heart:    Regular rate and rhythm, S1 and S2 normal, no murmur, rub   or gallop  Breast Exam:  deferred  Abdomen:     Soft, non-tender, bowel sounds active all four quadrants, NO   G/R/R, no masses, no organomegaly  Genitalia:   pt declined  Rectal:    Pt declined  Extremities:   Extremities normal, atraumatic,  no cyanosis or gross edema  Pulses:   2+ and symmetric all extremities  Skin:   Warm, dry, Skin color, texture, turgor normal, no obvious rashes or lesions  Neurologic:   CNII-XII intact, normal strength, sensation and reflexes    Throughout     Recent Results (from the past 2160 hour(s))  IBC panel     Status: Abnormal   Collection Time: 09/19/15  4:22 PM  Result Value Ref Range   Iron 64 42 - 145 ug/dL   Transferrin 232.0 212.0 - 360.0 mg/dL   Saturation Ratios 19.7 (L) 20.0 - 50.0 %  CBC w/Diff     Status: Abnormal   Collection Time: 09/19/15  4:22 PM  Result Value Ref Range   WBC 9.6 4.0 - 10.5 K/uL   RBC 4.67 3.87 - 5.11 Mil/uL   Hemoglobin 15.1 (H) 12.0 - 15.0 g/dL   HCT 44.7 36.0 - 46.0 %   MCV 95.7 78.0 - 100.0 fl   MCHC 33.8 30.0 - 36.0 g/dL   RDW 13.2 11.5 - 15.5 %   Platelets 266.0 150.0 - 400.0 K/uL   Neutrophils Relative % 66.2 43.0 - 77.0 %   Lymphocytes Relative 19.8 12.0 - 46.0 %   Monocytes Relative 10.3 3.0 - 12.0 %   Eosinophils Relative 3.3 0.0 - 5.0 %   Basophils Relative 0.4 0.0 - 3.0 %   Neutro Abs 6.4 1.4 - 7.7 K/uL   Lymphs Abs 1.9 0.7 - 4.0 K/uL   Monocytes Absolute 1.0 0.1 - 1.0 K/uL   Eosinophils Absolute 0.3 0.0 - 0.7 K/uL   Basophils Absolute 0.0 0.0 - 0.1 K/uL  POCT Urinalysis Dipstick     Status: Abnormal   Collection Time: 10/08/15  2:38 PM  Result Value Ref Range   Color, UA yellow    Clarity, UA cloudy    Glucose, UA neg    Bilirubin, UA neg    Ketones, UA neg    Spec Grav, UA 1.020    Blood, UA Moderate    pH, UA 7.0    Protein, UA  negative    Urobilinogen, UA 0.2    Nitrite, UA negative    Leukocytes, UA large (3+) (A) Negative  Urine Culture     Status: None   Collection Time: 10/08/15  2:44 PM  Result Value Ref Range   Culture ESCHERICHIA COLI    Colony Count >=100,000 COLONIES/ML    Organism ID, Bacteria ESCHERICHIA COLI       Susceptibility   Escherichia coli -  (no method available)    AMPICILLIN <=2 Sensitive     AMOX/CLAVULANIC 4 Sensitive     AMPICILLIN/SULBACTAM <=2 Sensitive     PIP/TAZO <=4 Sensitive     IMIPENEM <=0.25 Sensitive     CEFAZOLIN <=4 Not Reportable     CEFTRIAXONE <=1 Sensitive     CEFTAZIDIME <=1 Sensitive     CEFEPIME <=1 Sensitive     GENTAMICIN <=1 Sensitive     TOBRAMYCIN <=1 Sensitive     CIPROFLOXACIN <=0.25 Sensitive     LEVOFLOXACIN <=0.12 Sensitive     NITROFURANTOIN <=16 Sensitive     TRIMETH/SULFA* <=20 Sensitive      * NR=NOT REPORTABLE,SEE COMMENTORAL therapy:A cefazolin MIC of <32 predicts susceptibility to the oral agents cefaclor,cefdinir,cefpodoxime,cefprozil,cefuroxime,cephalexin,and loracarbef when used for therapy of uncomplicated UTIs due to E.coli,K.pneumomiae,and P.mirabilis. PARENTERAL therapy: A cefazolinMIC of >8 indicates resistance to parenteralcefazolin. An alternate test method must beperformed to confirm susceptibility to parenteralcefazolin.  POCT  Urinalysis Dipstick     Status: Abnormal   Collection Time: 11/05/15  1:36 PM  Result Value Ref Range   Color, UA light yellow    Clarity, UA slightly cloudy    Glucose, UA negative    Bilirubin, UA negative    Ketones, UA negative    Spec Grav, UA 1.015    Blood, UA small    pH, UA 7.0    Protein, UA negative    Urobilinogen, UA 0.2    Nitrite, UA negative    Leukocytes, UA large (3+) (A) Negative  CBC     Status: None   Collection Time: 11/08/15 10:11 AM  Result Value Ref Range   WBC 8.8 3.8 - 10.8 K/uL   RBC 4.56 3.80 - 5.10 MIL/uL   Hemoglobin 14.8 11.7 - 15.5 g/dL   HCT 43.2 35.0 - 45.0 %     MCV 94.7 80.0 - 100.0 fL   MCH 32.5 27.0 - 33.0 pg   MCHC 34.3 32.0 - 36.0 g/dL   RDW 13.2 11.0 - 15.0 %   Platelets 219 140 - 400 K/uL   MPV 9.2 7.5 - 12.5 fL    Comment: ** Please note change in unit of measure and reference range(s). **  Comprehensive metabolic panel     Status: Abnormal   Collection Time: 11/08/15 10:11 AM  Result Value Ref Range   Sodium 141 135 - 146 mmol/L   Potassium 3.3 (L) 3.5 - 5.3 mmol/L   Chloride 96 (L) 98 - 110 mmol/L   CO2 33 (H) 20 - 31 mmol/L   Glucose, Bld 98 65 - 99 mg/dL   BUN 14 7 - 25 mg/dL   Creat 0.88 0.60 - 0.88 mg/dL    Comment:   For patients > or = 80 years of age: The upper reference limit for Creatinine is approximately 13% higher for people identified as African-American.      Total Bilirubin 0.6 0.2 - 1.2 mg/dL   Alkaline Phosphatase 49 33 - 130 U/L   AST 14 10 - 35 U/L   ALT 11 6 - 29 U/L   Total Protein 6.5 6.1 - 8.1 g/dL   Albumin 3.5 (L) 3.6 - 5.1 g/dL   Calcium 9.3 8.6 - 10.4 mg/dL  Hemoglobin A1c     Status: Abnormal   Collection Time: 11/08/15 10:11 AM  Result Value Ref Range   Hgb A1c MFr Bld 6.0 (H) <5.7 %    Comment:   For someone without known diabetes, a hemoglobin A1c value between 5.7% and 6.4% is consistent with prediabetes and should be confirmed with a follow-up test.   For someone with known diabetes, a value <7% indicates that their diabetes is well controlled. A1c targets should be individualized based on duration of diabetes, age, co-morbid conditions and other considerations.   This assay result is consistent with an increased risk of diabetes.   Currently, no consensus exists regarding use of hemoglobin A1c for diagnosis of diabetes in children.      Mean Plasma Glucose 126 mg/dL  Lipid panel     Status: None   Collection Time: 11/08/15 10:11 AM  Result Value Ref Range   Cholesterol 133 125 - 200 mg/dL   Triglycerides 84 <150 mg/dL   HDL 63 >=46 mg/dL   Total CHOL/HDL Ratio 2.1 <=5.0  Ratio   VLDL 17 <30 mg/dL   LDL Cholesterol 53 <130 mg/dL    Comment:   Total Cholesterol/HDL Ratio:CHD Risk  Coronary Heart Disease Risk Table                                        Men       Women          1/2 Average Risk              3.4        3.3              Average Risk              5.0        4.4           2X Average Risk              9.6        7.1           3X Average Risk             23.4       11.0 Use the calculated Patient Ratio above and the CHD Risk table  to determine the patient's CHD Risk.   Vitamin B12     Status: None   Collection Time: 11/08/15 10:11 AM  Result Value Ref Range   Vitamin B-12 933 200 - 1100 pg/mL  VITAMIN D 25 Hydroxy (Vit-D Deficiency, Fractures)     Status: None   Collection Time: 11/08/15 10:11 AM  Result Value Ref Range   Vit D, 25-Hydroxy 54 30 - 100 ng/mL    Comment: Vitamin D Status           25-OH Vitamin D        Deficiency                <20 ng/mL        Insufficiency         20 - 29 ng/mL        Optimal             > or = 30 ng/mL   For 25-OH Vitamin D testing on patients on D2-supplementation and patients for whom quantitation of D2 and D3 fractions is required, the QuestAssureD 25-OH VIT D, (D2,D3), LC/MS/MS is recommended: order code [id number] (patients > 2 yrs).   TSH     Status: None   Collection Time: 11/08/15 10:11 AM  Result Value Ref Range   TSH 0.91 mIU/L    Comment:   Reference Range   > or = 20 Years  0.40-4.50   Pregnancy Range First trimester  0.26-2.66 Second trimester 0.55-2.73 Third trimester  0.43-2.91        Impression and Recommendations:    Elevated hemoglobin A1c Likely due to all recent steroids given, and still only 6.0  Vitamin D insufficiency Cont daily supplementation  COPD (chronic obstructive pulmonary disease) with emphysema (HCC) Stable- no acute changes, starts Pulm Rehab soon  Hypothyroidism; on synthroid Stable, denies signs/ sx hyperthyr, cont same  dose  Hematuria Await Urology input  Hypertension Stop HCTZ due to too low of BP and also low K. , patient aware that taking off the thiazide diuretic and continuing the amlodipine and B-bl may cause her lower extremity edema to recur.   1) Anticipatory Guidance: Discussed importance of wearing a seatbelt while driving, changing batteries in smoke detector at least once annually; sunscreen when outside along with skin surveillance; eating a  balanced and modest diet- and increase her protein due to low albumin on recent lab work; inc physical activity as tol .  2) Immunizations / Screenings / Labs:  All immunizations are up-to-date per recommendations or will be updated today.   3) Weight:  BMI does indicate N.  Improve nutrient density of diet through increasing intake of fruits and vegetables and decreasing saturated fats, white flour products and refined sugars. More protein  Gross side effects, risk and benefits, and alternatives of medications discussed with patient.  Patient is aware that all medications have potential side effects and we are unable to predict every sideeffect or drug-drug interaction that may occur.  Expresses verbal understanding and consents to current therapy plan and treatment regiment.  Follow-up preventative CPE in 1 year. F/up sooner for chronic care management and/or prn  Please see orders placed and AVS handed out to patient at the end of our visit for further patient instructions/ counseling done pertaining to today's office visit.

## 2015-11-13 NOTE — Assessment & Plan Note (Signed)
Await Urology input

## 2015-11-13 NOTE — Assessment & Plan Note (Signed)
Cont daily supplementation

## 2015-11-13 NOTE — Patient Instructions (Addendum)
Protein shakes- 30 grams extra of protein per day- at least one extra shake per day  Stop HCTZ (the hydrochlorothiazide blood pressure pill), you may notice your legs swell a little more than usual with this change.  - cont to monitor BP at home every couple days.   have [name],CMA check your BP every couple days.  Write it down and keep a log of it.   If BP not improving, going up some with this new medication change, return to clinic sooner than planned.        Preventing Dehydration  What is dehydration?  Dehydration is a lack of fluid in the body. Fluid fills almost every space in our bodies and even helps form the structure of some larger molecules like protein. Dehydration can be dangerous and sometimes even fatal.  . Water is the primary fluid in the body and serves a key role in the digestion, absorption, and transport of nutrients in food.  . Water aids in the removal of toxins and waste products from our bodies.  . Water helps the proper regulation of body temperature and joint lubrication.  . The human body cannot store fluid, so it must be replaced every day.   When fluid intake is adequate, the body has the right amount of fluid and electrolytes for proper functioning.  Is dehydration more common in older adults?  Yes, older adults are more vulnerable to dehydration. Reduced muscle mass, decreased sensitivity to thirst and less efficient kidney function all can contribute to dehydration. Studies show that one in three older adults may not get enough fluid. Declines in physical condition and mental sharpness can also contribute to dehydration.  What other things affect hydration status?  Dehydration can result because of weather or health status. For example, in a hot environment we perspire more, resulting in greater fluid loss. Dry winter air can increase fluid loss. Running a fever increases fluid needs. Medications, especially diuretics and laxatives also can increase fluid  loss.  What are the signs and symptoms of dehydration?  Dehydration is identified many different ways. The first warning sign of dehydration is thirst. Other symptoms include:  Headache  Fatigue  Dark urine  Weight loss  Increased heart rate  Low blood pressure  Decreased urination  Dry mouth, tongue, and eyes  Constipation  Sunken eyeballs  Decreased functional ability  Decreased skin turgor In some cases, problems such as weakness, trembling, lethargy, or confusion can result from dehydration.  How much fluid does a person need every day?  Fluid needs are based on a person's body size, the weather, activity, and medical history. The old recommendation of eight 8-ounce (oz.) glasses of water per day is a good place to start in figuring out how much water you need. Evaluating the color of your urine while eating a balanced diet and drinking eight 8-ounce glasses of fluid in a day can help indicate if you need daily fluid. A registered dietitian can help you figure out how much fluid is needed. Milk, juice, coffee, tea, and  water all count as sources of fluids. Discuss the amount of fluids that you drink with your doctor or healthcare professional. Some conditions call for a person to limit or restrict fluids.  Are there fluids in foods?  Yes. Individuals who eat a balanced diet can get the equivalent of two to three 8-oz glasses of water from the fluids found in the foods they eat. Many fruits and vegetables have high water content,  as do soups, gelatin, and pudding. Dry foods, such as snack foods, cookies, and cheeses, have low water content. If a person is not eating, or is not eating well, additional fluids may be needed to make up for the fluids missed by not eating well.  Tips to stay hydrated  . Drink water and other beverages on a schedule to help reach goal.  . Set a daily fluid goal. Individual needs vary but a minimum of six to  eight 8-oz glasses can be a starting point Include  soup and other high fluid content foods in your diet regularly.  . Eat five to nine servings of fruits and vegetables each day. Limit fruit juice.  . Drink a glass of water or other beverage with meals, snacks, and medications.  Marland Kitchen Keep a glass of water nearby and take frequent sips.  . Drink warm beverages in the winter and cool beverages in the summer.  . Add flavor to water with lemon, cucumber slices, or mint leaves.  . Select beverages that are low in calories to prevent weight gain.  . Remind elderly friends and loved ones to drink, offer the glass rather than just asking.  . Be alert for signs and symptoms of dehydration.  . Limit, or avoid, alcohol which is a diuretic (increases urine output).  . Look at your urine, if it is dark (like apple juice) then drink more liquids. Pale yellow urine indicates good hydration status.

## 2015-11-13 NOTE — Assessment & Plan Note (Signed)
Stable, denies signs/ sx hyperthyr, cont same dose

## 2015-11-18 ENCOUNTER — Other Ambulatory Visit: Payer: Self-pay

## 2015-11-18 MED ORDER — LEVOTHYROXINE SODIUM 125 MCG PO TABS: 125.0000 ug | ORAL_TABLET | Freq: Every day | ORAL | Status: DC

## 2015-11-18 MED ORDER — PROPRANOLOL HCL ER 60 MG PO CP24
60.0000 mg | ORAL_CAPSULE | Freq: Every day | ORAL | Status: DC
Start: 2015-11-18 — End: 2016-02-13

## 2015-11-21 ENCOUNTER — Encounter (HOSPITAL_COMMUNITY)
Admission: RE | Admit: 2015-11-21 | Discharge: 2015-11-21 | Disposition: A | Discharge status: Home / Self Care | Payer: Medicare Other | Source: Ambulatory Visit | Attending: Emergency Medicine | Admitting: Emergency Medicine

## 2015-11-21 DIAGNOSIS — J438 Other emphysema: Secondary | ICD-10-CM | POA: Insufficient documentation

## 2015-11-21 NOTE — Progress Notes (Signed)
Daily Session Note  Patient Details  Name: [name] MRN: [id number] Date of Birth: 21-May-1929 Referring Provider:        Pulmonary Rehab Walk Test from 11/12/2015 in [place]   Referring Provider  Dr. Lamonte Sakai      Encounter Date: 11/21/2015  Check In:     Session Check In - 11/21/15 1357    Check-In   Location [facility]   Staff Present Rosebud Poles, RN, BSN;Lisa Ysidro Evert, Felipe Drone, RN, MHA;Molly diVincenzo, MS, ACSM RCEP, Exercise Physiologist   Supervising physician immediately available to respond to emergencies [facility] immediately available   Physician(s) Dr. Cruzita Lederer   Medication changes reported     No   Fall or balance concerns reported    No   Warm-up and Cool-down Performed as group-led instruction   Resistance Training Performed Yes   VAD Patient? No   Pain Assessment   Currently in Pain? No/denies   Multiple Pain Sites No      Capillary Blood Glucose: No results found for this or any previous visit (from the past 24 hour(s)).      Exercise Prescription Changes - 11/21/15 1600    Response to Exercise   Blood Pressure (Admit) 116/56 mmHg   Blood Pressure (Exercise) 108/56 mmHg   Blood Pressure (Exit) 104/70 mmHg   Heart Rate (Admit) 61 bpm   Heart Rate (Exercise) 59 bpm   Heart Rate (Exit) 58 bpm   Oxygen Saturation (Admit) 97 %   Oxygen Saturation (Exercise) 96 %   Oxygen Saturation (Exit) 94 %   Rating of Perceived Exertion (Exercise) 11   Perceived Dyspnea (Exercise) 1   Duration Progress to 45 minutes of aerobic exercise without signs/symptoms of physical distress   Intensity THRR New   Progression   Progression Continue to progress workloads to maintain intensity without signs/symptoms of physical distress.   Resistance Training   Training Prescription Yes   Weight Orange Bands   Reps 10-12   Interval Training   Interval Training No   NuStep   Level 1   Minutes 17   METs 1.8   Track   Laps 11   Minutes 17     Goals Met:  Exercise tolerated well Strength training completed today  Goals Unmet:  Not Applicable  Comments: Service time is from 1330 to 1530    Dr. Rush Farmer is Medical Director for Pulmonary Rehab at [facility].

## 2015-11-26 ENCOUNTER — Encounter (HOSPITAL_COMMUNITY)
Admission: RE | Admit: 2015-11-26 | Discharge: 2015-11-26 | Disposition: A | Discharge status: Home / Self Care | Payer: Medicare Other | Source: Ambulatory Visit | Attending: Emergency Medicine | Admitting: Emergency Medicine

## 2015-11-26 VITALS — Wt 116.0 lb

## 2015-11-26 DIAGNOSIS — J438 Other emphysema: Secondary | ICD-10-CM | POA: Diagnosis not present

## 2015-11-26 NOTE — Progress Notes (Signed)
Daily Session Note  Patient Details  Name: [name] MRN: [id number] Date of Birth: 03-23-1930 Referring Provider:        Pulmonary Rehab Walk Test from 11/12/2015 in [place]   Referring Provider  Dr. Lamonte Sakai      Encounter Date: 11/26/2015  Check In:     Session Check In - 11/26/15 1330    Check-In   Location [facility]   Staff Present Rosebud Poles, RN, BSN;Lisa Ysidro Evert, RN;Portia Rollene Rotunda, RN, BSN;Ramon Dredge, RN, [facility]   Supervising physician immediately available to respond to emergencies [facility] immediately available   Physician(s) Dr. Marily Memos   Medication changes reported     No   Fall or balance concerns reported    No   Warm-up and Cool-down Performed as group-led instruction   Resistance Training Performed Yes   VAD Patient? No   Pain Assessment   Currently in Pain? No/denies   Multiple Pain Sites No      Capillary Blood Glucose: No results found for this or any previous visit (from the past 24 hour(s)).      Exercise Prescription Changes - 11/26/15 1600    Response to Exercise   Blood Pressure (Admit) 94/50 mmHg   Blood Pressure (Exercise) 96/50 mmHg   Blood Pressure (Exit) 90/48 mmHg   Heart Rate (Admit) 67 bpm   Heart Rate (Exercise) 69 bpm   Heart Rate (Exit) 58 bpm   Oxygen Saturation (Admit) 90 %   Oxygen Saturation (Exercise) 89 %  chest felt tight, used rescue inhaler, increased O2 to 3 L   Oxygen Saturation (Exit) 99 %   Rating of Perceived Exertion (Exercise) 13   Perceived Dyspnea (Exercise) 1   Duration Progress to 45 minutes of aerobic exercise without signs/symptoms of physical distress   Intensity THRR New   Progression   Progression Continue to progress workloads to maintain intensity without signs/symptoms of physical distress.   Resistance Training   Training Prescription Yes   Weight Orange Bands   Reps 10-12   Interval Training   Interval Training No   Oxygen   Oxygen Continuous   Liters 3   NuStep   Level 1   Minutes 17   METs 2.2   Arm Ergometer   Level 1   Minutes 17   Track   Laps 3  used rescue inhaler    Minutes 17     Goals Met:  Exercise tolerated well Strength training completed today  Goals Unmet:  Not Applicable  Comments: Service time is from 1330 to 1515    Dr. Rush Farmer is Medical Director for Pulmonary Rehab at [facility].

## 2015-11-28 ENCOUNTER — Telehealth: Payer: Self-pay

## 2015-11-28 ENCOUNTER — Encounter (HOSPITAL_COMMUNITY)
Admission: RE | Admit: 2015-11-28 | Discharge: 2015-11-28 | Disposition: A | Discharge status: Home / Self Care | Payer: Medicare Other | Source: Ambulatory Visit | Attending: Emergency Medicine | Admitting: Emergency Medicine

## 2015-11-28 NOTE — Progress Notes (Signed)
Pulmonary Individual Treatment Plan  Patient Details  Name: [name] MRN: [id number] Date of Birth: 1929-10-31 Referring Provider:        Pulmonary Rehab Walk Test from 11/12/2015 in [place]   Referring Provider  Dr. Lamonte Sakai      Initial Encounter Date:       Pulmonary Rehab Walk Test from 11/12/2015 in [place]   Date  11/12/15   Referring Provider  Dr. Lamonte Sakai      Visit Diagnosis: No diagnosis found.  Patient's Home Medications on Admission:   Current outpatient prescriptions:  .  albuterol (PROVENTIL HFA;VENTOLIN HFA) 108 (90 Base) MCG/ACT inhaler, Inhale 2 puffs into the lungs every 6 (six) hours as needed for wheezing or shortness of breath., Disp: 1 Inhaler, Rfl: 2 .  albuterol (PROVENTIL) (2.5 MG/3ML) 0.083% nebulizer solution, Take 3 mLs (2.5 mg total) by nebulization every 4 (four) hours as needed for wheezing or shortness of breath., Disp: 300 mL, Rfl: 5 .  amLODipine (NORVASC) 10 MG tablet, Take 1 tablet (10 mg total) by mouth daily., Disp: 90 tablet, Rfl: 3 .  aspirin 81 MG tablet, Take 81 mg by mouth daily., Disp: , Rfl:  .  DiphenhydrAMINE HCl (BENADRYL ALLERGY PO), Take 1 capsule by mouth at bedtime., Disp: , Rfl:  .  levothyroxine (SYNTHROID, LEVOTHROID) 125 MCG tablet, Take 1 tablet (125 mcg total) by mouth daily., Disp: 90 tablet, Rfl: 0 .  Melatonin 3 MG TABS, Take 2 tablets by mouth at bedtime., Disp: , Rfl:  .  Multiple Vitamins-Minerals (MULTIVITAMIN WITH MINERALS) tablet, Take 1 tablet by mouth daily., Disp: , Rfl:  .  predniSONE (DELTASONE) 10 MG tablet, Reported on 11/13/2015, Disp: , Rfl:  .  propranolol ER (INDERAL LA) 60 MG 24 hr capsule, Take 1 capsule (60 mg total) by mouth daily., Disp: 90 capsule, Rfl: 0 .  simvastatin (ZOCOR) 20 MG tablet, Take 20 mg by mouth at bedtime.  , Disp: , Rfl:  .  STIOLTO RESPIMAT 2.5-2.5 MCG/ACT AERS, INHALE TWO PUFFS INTO THE LUNGS ONCE DAILY, Disp: 4 g,  Rfl: 5  Past Medical History: Past Medical History  Diagnosis Date  . Hypertension   . Osteopenia   . Hypothyroid   . Aortic stenosis   . Carotid artery disease ([place])   . Hypercalcemia   . Pressure urticaria   . Allergic rhinitis   . RLS (restless legs syndrome)   . Colon polyps   . S/P aortic valve replacement with stentless valve 03/18/2011  . h/o Atrial fibrillation ([place]) 03/22/2011    Brief post op   . Hematuria 11/05/2015  . 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 S/P aortic valve replacement and aortoplasty 04/13/2011    70m Medtronic Freestyle porcine aortic root     Tobacco Use: History  Smoking status  . Former Smoker -- 0.90 packs/day for 62 years  . Types: Cigarettes  . Quit date: 07/14/2010  Smokeless tobacco  . Never Used    Labs: Recent Review Flowsheet Data    Labs for ITP Cardiac and Pulmonary Rehab Latest Ref Rng 03/18/2011 03/19/2011 03/19/2011 03/19/2011 11/08/2015   Cholestrol 125 - 200 mg/dL - - - - 133   LDLCALC <130 mg/dL - - - - 53   HDL >=46 mg/dL - - - - 63   Trlycerides <150 mg/dL - - - - 84   Hemoglobin A1c <5.7 % - - - - 6.0(H)   PHART 7.350 - 7.400 -  7.316(L) 7.295(L) 7.359 -   PCO2ART 35.0 - 45.0 mmHg - 49.2(H) 50.8(H) 43.7 -   HCO3 20.0 - 24.0 mEq/L - 25.0(H) 24.6(H) 24.5(H) -   TCO2 0 - 100 mmol/L _0 -   ACIDBASEDEF 0.0 - 2.0 mmol/L - 1.0 2.0 1.0 -   O2SAT - - 97.0 97.0 99.0 -      Capillary Blood Glucose: Lab Results  Component Value Date   GLUCAP 86 03/24/2011   GLUCAP 123* 03/23/2011   GLUCAP 114* 03/23/2011   GLUCAP 111* 03/23/2011   GLUCAP 88 03/23/2011     ADL UCSD:     Pulmonary Assessment Scores      11/12/15 1442       ADL UCSD   ADL Phase Entry     SOB Score total 61        Pulmonary Function Assessment:   Exercise Target Goals:    Exercise Program Goal: Individual exercise prescription set with THRR, safety & activity barriers. Participant demonstrates ability to understand and report RPE using  BORG scale, to self-measure pulse accurately, and to acknowledge the importance of the exercise prescription.  Exercise Prescription Goal: Starting with aerobic activity 30 plus minutes a day, 3 days per week for initial exercise prescription. Provide home exercise prescription and guidelines that participant acknowledges understanding prior to discharge.  Activity Barriers & Risk Stratification:     Activity Barriers & Cardiac Risk Stratification - 11/11/15 1252    Activity Barriers & Cardiac Risk Stratification   Activity Barriers Shortness of Breath;Deconditioning  has knee pain with peddling a bicycle      6 Minute Walk:     6 Minute Walk      11/12/15 1623       6 Minute Walk   Phase Initial     Distance 1005 feet     Walk Time 6 minutes     # of Rest Breaks 0     MPH 1.9     METS 2.45     RPE 11     Perceived Dyspnea  1     Symptoms No     Resting HR 72 bpm     Resting BP 96/60 mmHg     Max Ex. HR 68 bpm     Max Ex. BP 120/50 mmHg     Interval HR   Baseline HR 72     1 Minute HR 64     2 Minute HR 67     3 Minute HR 67     4 Minute HR 68     5 Minute HR 68     6 Minute HR 68     2 Minute Post HR 60     Interval Heart Rate? Yes     Interval Oxygen   Interval Oxygen? Yes     Baseline Oxygen Saturation % 95 %     Baseline Liters of Oxygen 3 L     1 Minute Oxygen Saturation % 96 %     1 Minute Liters of Oxygen 3 L     2 Minute Oxygen Saturation % 97 %     2 Minute Liters of Oxygen 3 L     3 Minute Oxygen Saturation % 94 %     3 Minute Liters of Oxygen 3 L     4 Minute Oxygen Saturation % 96 %     4 Minute Liters of Oxygen 3 L     5 Minute  Oxygen Saturation % 96 %     5 Minute Liters of Oxygen 3 L     6 Minute Oxygen Saturation % 96 %     6 Minute Liters of Oxygen 3 L     2 Minute Post Oxygen Saturation % 97 %     2 Minute Post Liters of Oxygen 3 L        Initial Exercise Prescription:     Initial Exercise Prescription - 11/12/15 1600    Date of  Initial Exercise RX and Referring Provider   Date 11/12/15   Referring Provider Dr. Lamonte Sakai   Oxygen   Oxygen Continuous   Liters 3   NuStep   Level 1   Minutes 15   METs 1.3   Arm/Foot Ergometer   Level 1   Minutes 15   Track   Laps 5   Minutes 15   Prescription Details   Frequency (times per week) 2   Duration Progress to 45 minutes of aerobic exercise without signs/symptoms of physical distress   Intensity   THRR 40-80% of Max Heartrate 55-110   Ratings of Perceived Exertion 11-13   Perceived Dyspnea 0-4   Progression   Progression Continue progressive overload as per policy without signs/symptoms or physical distress.   Resistance Training   Training Prescription Yes   Weight Orange Bands   Reps 10-12      Perform Capillary Blood Glucose checks as needed.  Exercise Prescription Changes:     Exercise Prescription Changes      11/21/15 1600 11/26/15 1600         Response to Exercise   Blood Pressure (Admit) 116/56 mmHg 94/50 mmHg      Blood Pressure (Exercise) 108/56 mmHg 96/50 mmHg      Blood Pressure (Exit) 104/70 mmHg 90/48 mmHg      Heart Rate (Admit) 61 bpm 67 bpm      Heart Rate (Exercise) 59 bpm 69 bpm      Heart Rate (Exit) 58 bpm 58 bpm      Oxygen Saturation (Admit) 97 % 90 %      Oxygen Saturation (Exercise) 96 % 89 %  chest felt tight, used rescue inhaler, increased O2 to 3 L      Oxygen Saturation (Exit) 94 % 99 %      Rating of Perceived Exertion (Exercise) 11 13      Perceived Dyspnea (Exercise) 1 1      Duration Progress to 45 minutes of aerobic exercise without signs/symptoms of physical distress Progress to 45 minutes of aerobic exercise without signs/symptoms of physical distress      Intensity THRR New THRR New      Progression   Progression Continue to progress workloads to maintain intensity without signs/symptoms of physical distress. Continue to progress workloads to maintain intensity without signs/symptoms of physical distress.       Resistance Training   Training Prescription Yes Yes      Weight Orange Bands Orange Bands      Reps 10-12 10-12      Interval Training   Interval Training No No      Oxygen   Oxygen  Continuous      Liters  3      NuStep   Level 1 1      Minutes 17 17      METs 1.8 2.2      Arm Ergometer   Level  1  Minutes  17      Track   Laps 11 3  used rescue inhaler       Minutes 17 17         Exercise Comments:     Exercise Comments      11/28/15 0832           Exercise Comments Just started program, has only exercised 2 sessions, too eary to see any progress, tolerating exercise well.          Discharge Exercise Prescription (Final Exercise Prescription Changes):     Exercise Prescription Changes - 11/26/15 1600    Response to Exercise   Blood Pressure (Admit) 94/50 mmHg   Blood Pressure (Exercise) 96/50 mmHg   Blood Pressure (Exit) 90/48 mmHg   Heart Rate (Admit) 67 bpm   Heart Rate (Exercise) 69 bpm   Heart Rate (Exit) 58 bpm   Oxygen Saturation (Admit) 90 %   Oxygen Saturation (Exercise) 89 %  chest felt tight, used rescue inhaler, increased O2 to 3 L   Oxygen Saturation (Exit) 99 %   Rating of Perceived Exertion (Exercise) 13   Perceived Dyspnea (Exercise) 1   Duration Progress to 45 minutes of aerobic exercise without signs/symptoms of physical distress   Intensity THRR New   Progression   Progression Continue to progress workloads to maintain intensity without signs/symptoms of physical distress.   Resistance Training   Training Prescription Yes   Weight Orange Bands   Reps 10-12   Interval Training   Interval Training No   Oxygen   Oxygen Continuous   Liters 3   NuStep   Level 1   Minutes 17   METs 2.2   Arm Ergometer   Level 1   Minutes 17   Track   Laps 3  used rescue inhaler    Minutes 17       Nutrition:  Target Goals: Understanding of nutrition guidelines, daily intake of sodium <156m, cholesterol <2073m calories 30% from fat  and 7% or less from saturated fats, daily to have 5 or more servings of fruits and vegetables.  Biometrics:     Pre Biometrics - 11/11/15 1256    Pre Biometrics   Grip Strength 24 kg       Nutrition Therapy Plan and Nutrition Goals:   Nutrition Discharge: Rate Your Plate Scores:   Psychosocial: Target Goals: Acknowledge presence or absence of depression, maximize coping skills, provide positive support system. Participant is able to verbalize types and ability to use techniques and skills needed for reducing stress and depression.  Initial Review & Psychosocial Screening:     Initial Psych Review & Screening - 11/11/15 1302    Initial Review   Current issues with --  none identified   Family Dynamics   Good Support System? Yes   Barriers   Psychosocial barriers to participate in program There are no identifiable barriers or psychosocial needs.   Screening Interventions   Interventions Encouraged to exercise      Quality of Life Scores:     Quality of Life - 11/12/15 1443    Quality of Life Scores   Health/Function Pre 12.32 %   Socioeconomic Pre 22.5 %   Psych/Spiritual Pre 19.92 %   Family Pre 2 %   GLOBAL Pre 15.18 %      PHQ-9:     Recent Review Flowsheet Data    [facility] 11/13[facility]2015 11/11/2015 06/21/2015 06/08/2015 08/10/2011   Decreased Interest 0  0 0 0 0   Down, Depressed, Hopeless 0 0 0 0 0   PHQ - 2 Score 0 0 0 0 0      Psychosocial Evaluation and Intervention:     Psychosocial Evaluation - 11/11/15 1303    Psychosocial Evaluation & Interventions   Interventions Encouraged to exercise with the program and follow exercise prescription   Comments none identified   Continued Psychosocial Services Needed No      Psychosocial Re-Evaluation:     Psychosocial Re-Evaluation      11/25/15 1133           Psychosocial Re-Evaluation   Interventions Encouraged to attend Pulmonary Rehabilitation for the exercise       Comments No  psychosocial concerns identified at this time.       Continued Psychosocial Services Needed No         Education: Education Goals: Education classes will be provided on a weekly basis, covering required topics. Participant will state understanding/return demonstration of topics presented.  Learning Barriers/Preferences:     Learning Barriers/Preferences - 11/11/15 1254    Learning Barriers/Preferences   Learning Barriers None   Learning Preferences Written Material;Group Instruction;Skilled Demonstration      Education Topics: Risk Factor Reduction:  -Group instruction that is supported by a PowerPoint presentation. Instructor discusses the definition of a risk factor, different risk factors for pulmonary disease, and how the heart and lungs work together.     Nutrition for Pulmonary Patient:  -Group instruction provided by PowerPoint slides, verbal discussion, and written materials to support subject matter. The instructor gives an explanation and review of healthy diet recommendations, which includes a discussion on weight management, recommendations for fruit and vegetable consumption, as well as protein, fluid, caffeine, fiber, sodium, sugar, and alcohol. Tips for eating when patients are short of breath are discussed.   Pursed Lip Breathing:  -Group instruction that is supported by demonstration and informational handouts. Instructor discusses the benefits of pursed lip and diaphragmatic breathing and detailed demonstration on how to preform both.     Oxygen Safety:  -Group instruction provided by PowerPoint, verbal discussion, and written material to support subject matter. There is an overview of "What is Oxygen" and "Why do we need it".  Instructor also reviews how to create a safe environment for oxygen use, the importance of using oxygen as prescribed, and the risks of noncompliance. There is a brief discussion on traveling with oxygen and resources the patient may  utilize.   Oxygen Equipment:  -Group instruction provided by [facility] Staff utilizing handouts, written materials, and equipment demonstrations.   Signs and Symptoms:  -Group instruction provided by written material and verbal discussion to support subject matter. Warning signs and symptoms of infection, stroke, and heart attack are reviewed and when to call the physician/911 reinforced. Tips for preventing the spread of infection discussed.   Advanced Directives:  -Group instruction provided by verbal instruction and written material to support subject matter. Instructor reviews Advanced Directive laws and proper instruction for filling out document.   Pulmonary Video:  -Group video education that reviews the importance of medication and oxygen compliance, exercise, good nutrition, pulmonary hygiene, and pursed lip and diaphragmatic breathing for the pulmonary patient.   Exercise for the Pulmonary Patient:  -Group instruction that is supported by a PowerPoint presentation. Instructor discusses benefits of exercise, core components of exercise, frequency, duration, and intensity of an exercise routine, importance of utilizing pulse oximetry during exercise, safety while exercising, and options  of places to exercise outside of rehab.            PULMONARY REHAB OTHER RESPIRATORY from 11/21/2015 in [place]   Date  11/21/15   Educator  EP   Instruction Review Code  2- meets goals/outcomes      Pulmonary Medications:  -Verbally interactive group education provided by instructor with focus on inhaled medications and proper administration.   Anatomy and Physiology of the Respiratory System and Intimacy:  -Group instruction provided by PowerPoint, verbal discussion, and written material to support subject matter. Instructor reviews respiratory cycle and anatomical components of the respiratory system and their functions. Instructor also reviews  differences in obstructive and restrictive respiratory diseases with examples of each. Intimacy, Sex, and Sexuality differences are reviewed with a discussion on how relationships can change when diagnosed with pulmonary disease. Common sexual concerns are reviewed.   Knowledge Questionnaire Score:     Knowledge Questionnaire Score - 11/12/15 1442    Knowledge Questionnaire Score   Pre Score 6/13      Core Components/Risk Factors/Patient Goals at Admission:     Personal Goals and Risk Factors at Admission - 11/11/15 1258    Core Components/Risk Factors/Patient Goals on Admission   Increase Strength and Stamina Yes   Intervention Provide advice, education, support and counseling about physical activity/exercise needs.;Develop an individualized exercise prescription for aerobic and resistive training based on initial evaluation findings, risk stratification, comorbidities and participant's personal goals.   Expected Outcomes Achievement of increased cardiorespiratory fitness and enhanced flexibility, muscular endurance and strength shown through measurements of functional capacity and personal statement of participant.   Improve shortness of breath with ADL's Yes   Intervention Provide education, individualized exercise plan and daily activity instruction to help decrease symptoms of SOB with activities of daily living.   Expected Outcomes Short Term: Achieves a reduction of symptoms when performing activities of daily living.      Core Components/Risk Factors/Patient Goals Review:      Goals and Risk Factor Review      11/11/15 1300 11/25/15 1131         Core Components/Risk Factors/Patient Goals Review   Personal Goals Review Increase Strength and Stamina;Improve shortness of breath with ADL's Increase Strength and Stamina;Improve shortness of breath with ADL's      Review increase strength, stamina with exercise in program Has attended less than 5 exercise sessions, too soon to  see improvement.      Expected Outcomes Improved QOL and ability to perform ADL's with more ease Should see an improvement in the next 30 days in strength and stamina         Core Components/Risk Factors/Patient Goals at Discharge (Final Review):      Goals and Risk Factor Review - 11/25/15 1131    Core Components/Risk Factors/Patient Goals Review   Personal Goals Review Increase Strength and Stamina;Improve shortness of breath with ADL's   Review Has attended less than 5 exercise sessions, too soon to see improvement.   Expected Outcomes Should see an improvement in the next 30 days in strength and stamina      ITP Comments:   Comments: ITP REVIEW Pt is making expected progress toward personal goals after completing 2sessions.   Recommend continued exercise, life style modification, education, and utilization of breathing techniques to increase stamina and strength and decrease shortness of breath with exertion.

## 2015-11-28 NOTE — Telephone Encounter (Signed)
Blood pressure readings:  110/50 1_0 90/48  Per Dr. Raliegh Scarlet, pt advised to DC amlodipine and OV in 2 weeks to re-evaluate blood pressure.  Pt expressed understanding and is agreeable.  Charyl Bigger, CMA

## 2015-12-02 ENCOUNTER — Ambulatory Visit (INDEPENDENT_AMBULATORY_CARE_PROVIDER_SITE_OTHER): Payer: Medicare Other | Admitting: Emergency Medicine

## 2015-12-02 ENCOUNTER — Encounter: Payer: Self-pay | Admitting: Emergency Medicine

## 2015-12-02 VITALS — BP 100/62 | HR 53 | Ht 62.25 in | Wt 117.0 lb

## 2015-12-02 DIAGNOSIS — J449 Chronic obstructive pulmonary disease, unspecified: Secondary | ICD-10-CM | POA: Diagnosis not present

## 2015-12-02 DIAGNOSIS — J438 Other emphysema: Secondary | ICD-10-CM

## 2015-12-02 NOTE — Progress Notes (Signed)
History of Present Illness [name] is a 82 y.o. female with Emphysema on oxygen day and night.  82 year old woman, former smoker (50 Pack years), followed by Dr. Gwenette Rich in the past for COPD. Also with a history of hypertension, aortic valve bioprosthesis. She has been managed on Spiriva, was changed to Ravia over a year to see if she would benefit more. She reports exertional SOB, difficulty getting a deep breath walking through the house or on an incline. She has to rest when doing vacuuming. She feels the breathing has been slowly worse. She has uses albuterol at these times with some improvement. She was started on propranolol recently, ? Whether this could be a factor.   7/17/20174 Month Follow Up Appointment: Pt. Presents to the office today for her  follow up appointment. She  states she has started on home oxygen in May of this year. She has subsequently been seen 11/01/2015 for COPD Flare. She was treated with prednisone taper and Mucinex at that time.She states this resolved her flare.. She is currently being treated for a bladder infection, but she is not taking the medication prescribed due to the fact it caused her to itch.She is compliant with her pulmonary medications. She is compliant with her Stialto and her albuterol neb treatments. She has not had to use her neb treatments today. She does complain of chest tightness that makes it diffucult to get her breath. She states this has been going on for about 3 weeks.She states it does wake her from sleep at times.She denies chest pain, fever, very little cough, no purulent secretions, orthopnea of hemoptysis. She is currently participating in Pulmonary Rehab at [facility].  Tests IMPRESSION: 1. COPD. No acute pulmonary disease.  2. Prior CABG. Stable cardiomegaly. No CHF.  Past medical hx Past Medical History  Diagnosis Date  . Hypertension   . Osteopenia   . Hypothyroid   . Aortic stenosis   . Carotid artery disease ([place])    . Hypercalcemia   . Pressure urticaria   . Allergic rhinitis   . RLS (restless legs syndrome)   . Colon polyps   . S/P aortic valve replacement with stentless valve 03/18/2011  . h/o Atrial fibrillation ([place]) 03/22/2011    Brief post op   . Hematuria 11/05/2015  . 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 S/P aortic valve replacement and aortoplasty 04/13/2011    19m Medtronic Freestyle porcine aortic root      Past surgical hx, Family hx, Social hx all reviewed.  Current Outpatient Prescriptions on File Prior to Visit  Medication Sig  . albuterol (PROVENTIL HFA;VENTOLIN HFA) 108 (90 Base) MCG/ACT inhaler Inhale 2 puffs into the lungs every 6 (six) hours as needed for wheezing or shortness of breath.  .Marland Kitchenalbuterol (PROVENTIL) (2.5 MG/3ML) 0.083% nebulizer solution Take 3 mLs (2.5 mg total) by nebulization every 4 (four) hours as needed for wheezing or shortness of breath.  .Marland Kitchenaspirin 81 MG tablet Take 81 mg by mouth daily.  . DiphenhydrAMINE HCl (BENADRYL ALLERGY PO) Take 1 capsule by mouth at bedtime.  .Marland Kitchenlevothyroxine (SYNTHROID, LEVOTHROID) 125 MCG tablet Take 1 tablet (125 mcg total) by mouth daily.  . Melatonin 3 MG TABS Take 2 tablets by mouth at bedtime.  . Multiple Vitamins-Minerals (MULTIVITAMIN WITH MINERALS) tablet Take 1 tablet by mouth daily.  . propranolol ER (INDERAL LA) 60 MG 24 hr capsule Take 1 capsule (60 mg total) by mouth daily.  . simvastatin (ZOCOR) 20 MG tablet Take 20  mg by mouth at bedtime.    Marland Kitchen STIOLTO RESPIMAT 2.5-2.5 MCG/ACT AERS INHALE TWO PUFFS INTO THE LUNGS ONCE DAILY   No current facility-administered medications on file prior to visit.     Allergies  Allergen Reactions  . Clarithromycin     Mouth/throat sore  . Penicillins Itching and Rash    Review Of Systems:  Constitutional:   No  weight loss, night sweats,  Fevers, chills, fatigue, or  lassitude.  HEENT:   No headaches,  Difficulty swallowing,  Tooth/dental problems, or  Sore throat,                 No sneezing, itching, ear ache, nasal congestion, post nasal drip,   CV:  No chest pain,  Orthopnea, PND, swelling in lower extremities, anasarca, dizziness, palpitations, syncope.   GI  No heartburn, indigestion, abdominal pain, nausea, vomiting, diarrhea, change in bowel habits, loss of appetite, bloody stools.   Resp: + shortness of breath with exertion not  at rest.  No excess mucus, no productive cough,  No non-productive cough,  No coughing up of blood.  No change in color of mucus.  No wheezing.  No chest wall deformity  Skin: no rash or lesions.  GU: no dysuria, change in color of urine, no urgency or frequency.  No flank pain, no hematuria   MS:  No joint pain or swelling.  No decreased range of motion.  No back pain.  Psych:  No change in mood or affect. No depression or anxiety.  No memory loss.   Vital Signs BP 100/62 mmHg  Pulse 53  Ht 5' 2.25" (1.581 m)  Wt 117 lb (53.071 kg)  BMI 21.23 kg/m2  SpO2 96%   Physical Exam:  General- No distress,  A&Ox3, thin pleasant female ENT: No sinus tenderness, TM clear, pale nasal mucosa, no oral exudate,no post nasal drip, no LAN Cardiac: S1, S2, regular rate and rhythm, no murmur Chest: No wheeze/ rales/ dullness; diminished bilaterally per bases, no accessory muscle use, no nasal flaring, no sternal retractions Abd.: Soft Non-tender Ext: No clubbing cyanosis, edema Neuro:  normal strength Skin: No rashes, warm and dry Psych: normal mood and behavior   Assessment/Plan  COPD (chronic obstructive pulmonary disease) with emphysema (HCC) We will consider doing PFT's since you have not done them since 2012. Please consider pepcid 20 mg daily  ( Famatodine) for your heartburn. Continue Stialto once daily as you have been doing. Rinse mouth after use. Continue to use your nebulizer treatments as needed up to 4 times daily. Continue your Anti histamine at night for allergies. Remember to get flu shot in the fall. Follow up  with Dr. Lamonte Rich in 4 months with PFT's prior to appointment same day. Please contact office for sooner follow up if symptoms do not improve or worsen or seek emergency care       Magdalen Spatz, NP 12/02/2015  3:08 PM    Attending Note:  I have examined patient, reviewed labs, studies and notes. I have discussed the case with Gladstone Pih, and I agree with the data and plans as amended above.   Ms Tellez returns for follow-up for her severe COPD. She clinically stable on her current regimen of Stiolto + albuterol nebs prn. She has started pulmonary rehabilitation, believes it's too early for her to determine whether it's going to be beneficial. She does note that she's benefited from the addition of exertional oxygen. She endorses some GERD symptoms which may be  making her COPD more labile. We discussed starting therapy for this today. She will consider this. Overall I agree with the plans of continuing her current bronchodilator regimen, continuous oxygen. We will recheck her pulmonary function testing at her next visit to compare with 2012.  Baltazar Apo, MD, PhD 12/02/2015, 3:28 PM [facility] [phone] or if no answer [phone]

## 2015-12-02 NOTE — Patient Instructions (Addendum)
It is good to see you again today. We will consider doing PFT's since you have not done them since 2012. Please consider pepcid 20 mg daily  ( Famatodine) for your heartburn. Continue Stialto once daily as you have been doing. Rinse mouth after use. Continue to use your nebulizer treatments as needed up to 4 times daily. Continue your Anti histamine at night for allergies. Remember to get flu shot in the fall. Follow up with Dr. Lamonte Sakai in 4 months with PFT's prior to appointment same day. Please contact office for sooner follow up if symptoms do not improve or worsen or seek emergency care

## 2015-12-02 NOTE — Assessment & Plan Note (Addendum)
We will consider doing PFT's since you have not done them since 2012. Please consider pepcid 20 mg daily  ( Famatodine) for your heartburn. Continue Stialto once daily as you have been doing. Rinse mouth after use. Continue to use your nebulizer treatments as needed up to 4 times daily. Continue your Anti histamine at night for allergies. Remember to get flu shot in the fall. Follow up with Dr. Lamonte Sakai in 4 months with PFT's prior to appointment same day. Please contact office for sooner follow up if symptoms do not improve or worsen or seek emergency care

## 2015-12-03 ENCOUNTER — Encounter (HOSPITAL_COMMUNITY)
Admission: RE | Admit: 2015-12-03 | Discharge: 2015-12-03 | Disposition: A | Discharge status: Home / Self Care | Payer: Medicare Other | Source: Ambulatory Visit | Attending: Emergency Medicine | Admitting: Emergency Medicine

## 2015-12-03 VITALS — Wt 114.6 lb

## 2015-12-03 DIAGNOSIS — J438 Other emphysema: Secondary | ICD-10-CM | POA: Diagnosis not present

## 2015-12-03 NOTE — Progress Notes (Signed)
Daily Session Note  Patient Details  Name: [name] MRN: [id number] Date of Birth: March 04, 1930 Referring Provider:        Pulmonary Rehab Walk Test from 11/12/2015 in [place]   Referring Provider  Dr. Lamonte Sakai      Encounter Date: 12/03/2015  Check In:     Session Check In - 12/03/15 1552    Check-In   Location [facility]   Staff Present Rosebud Poles, RN, BSN;Lisa Ysidro Evert, RN;Portia Rollene Rotunda, RN, BSN;Ramon Dredge, RN, MHA;Molly diVincenzo, MS, ACSM RCEP, Exercise Physiologist   Supervising physician immediately available to respond to emergencies [facility] immediately available   Physician(s) Dr. Marily Memos   Medication changes reported     No   Fall or balance concerns reported    No   Warm-up and Cool-down Performed as group-led instruction   Resistance Training Performed Yes   VAD Patient? No   Pain Assessment   Currently in Pain? No/denies   Multiple Pain Sites No      Capillary Blood Glucose: No results found for this or any previous visit (from the past 24 hour(s)).      Exercise Prescription Changes - 12/03/15 1500    Exercise Review   Progression Yes   Response to Exercise   Blood Pressure (Admit) 100/60 mmHg   Blood Pressure (Exercise) 130/56 mmHg   Blood Pressure (Exit) 110/40 mmHg   Heart Rate (Admit) 66 bpm   Heart Rate (Exercise) 61 bpm   Heart Rate (Exit) 57 bpm   Oxygen Saturation (Admit) 94 %   Oxygen Saturation (Exercise) 94 %   Oxygen Saturation (Exit) 97 %   Rating of Perceived Exertion (Exercise) 11   Perceived Dyspnea (Exercise) 1   Duration Progress to 45 minutes of aerobic exercise without signs/symptoms of physical distress   Intensity THRR New   Progression   Progression Continue to progress workloads to maintain intensity without signs/symptoms of physical distress.   Resistance Training   Training Prescription Yes   Weight Orange Bands   Reps 10-12  10 minutes of  strength training   Interval Training   Interval Training No   Oxygen   Oxygen Continuous   Liters 3   NuStep   Level 2   Minutes 17   METs 2.3   Arm Ergometer   Level 1   Minutes 17   Track   Laps 11   Minutes 17     Goals Met:  Exercise tolerated well Strength training completed today  Goals Unmet:  Not Applicable  Comments: Service time is from 1330 to 1500.   Dr. Rush Farmer is Medical Director for Pulmonary Rehab at [facility].

## 2015-12-05 ENCOUNTER — Encounter (HOSPITAL_COMMUNITY)
Admission: RE | Admit: 2015-12-05 | Discharge: 2015-12-05 | Disposition: A | Discharge status: Home / Self Care | Payer: Medicare Other | Source: Ambulatory Visit | Attending: Emergency Medicine | Admitting: Emergency Medicine

## 2015-12-05 VITALS — Wt 114.0 lb

## 2015-12-05 DIAGNOSIS — J438 Other emphysema: Secondary | ICD-10-CM

## 2015-12-05 NOTE — Progress Notes (Signed)
Daily Session Note  Patient Details  Name: [name] MRN: [id number] Date of Birth: 27-Sep-1929 Referring Provider:        Pulmonary Rehab Walk Test from 11/12/2015 in [place]   Referring Provider  Dr. Lamonte Sakai      Encounter Date: 12/05/2015  Check In:     Session Check In - 12/05/15 1330    Check-In   Location [facility]   Staff Present Rosebud Poles, RN, BSN;Jacie Tristan Ysidro Evert, RN;Portia Rollene Rotunda, RN, BSN;Ramon Dredge, RN, MHA;Molly diVincenzo, MS, ACSM RCEP, Exercise Physiologist   Supervising physician immediately available to respond to emergencies [facility] immediately available   Physician(s) Dr. Cruzita Lederer   Medication changes reported     No   Fall or balance concerns reported    No   Warm-up and Cool-down Performed as group-led instruction   Resistance Training Performed Yes   VAD Patient? No   Pain Assessment   Currently in Pain? No/denies   Multiple Pain Sites No      Capillary Blood Glucose: No results found for this or any previous visit (from the past 24 hour(s)).      Exercise Prescription Changes - 12/05/15 1600    Exercise Review   Progression Yes   Response to Exercise   Blood Pressure (Admit) 98/50 mmHg   Blood Pressure (Exercise) 124/72 mmHg   Blood Pressure (Exit) 102/60 mmHg   Heart Rate (Admit) 60 bpm   Heart Rate (Exercise) 63 bpm   Heart Rate (Exit) 53 bpm   Oxygen Saturation (Admit) 97 %   Oxygen Saturation (Exercise) 96 %   Oxygen Saturation (Exit) 97 %   Rating of Perceived Exertion (Exercise) 12   Perceived Dyspnea (Exercise) 1   Duration Progress to 45 minutes of aerobic exercise without signs/symptoms of physical distress   Intensity THRR unchanged   Progression   Progression Continue to progress workloads to maintain intensity without signs/symptoms of physical distress.   Resistance Training   Training Prescription Yes   Weight orange bands   Reps 10-12  10 minutes of  strength training   Interval Training   Interval Training No   Oxygen   Oxygen Continuous   Liters 3   NuStep   Level 3   Minutes 17   METs 2.1   Track   Laps 15   Minutes 17     Goals Met:  Exercise tolerated well No report of cardiac concerns or symptoms Strength training completed today  Goals Unmet:  Not Applicable  Comments: Service time is from 1330 to 1540    Dr. Rush Farmer is Medical Director for Pulmonary Rehab at [facility].

## 2015-12-06 ENCOUNTER — Encounter: Payer: Self-pay | Admitting: Family Medicine

## 2015-12-10 ENCOUNTER — Encounter (HOSPITAL_COMMUNITY)
Admission: RE | Admit: 2015-12-10 | Discharge: 2015-12-10 | Disposition: A | Discharge status: Home / Self Care | Payer: Medicare Other | Source: Ambulatory Visit | Attending: Emergency Medicine | Admitting: Emergency Medicine

## 2015-12-10 ENCOUNTER — Telehealth: Payer: Self-pay | Admitting: Emergency Medicine

## 2015-12-10 VITALS — Wt 115.5 lb

## 2015-12-10 DIAGNOSIS — J438 Other emphysema: Secondary | ICD-10-CM | POA: Diagnosis not present

## 2015-12-10 NOTE — Telephone Encounter (Signed)
ATC pt, received fast busy signal. [facility]

## 2015-12-10 NOTE — Telephone Encounter (Signed)
POC OK with me

## 2015-12-10 NOTE — Progress Notes (Signed)
Daily Session Note  Patient Details  Name: [name] MRN: [id number] Date of Birth: 1930/04/21 Referring Provider:   April Manson Pulmonary Rehab Walk Test from 11/12/2015 in [place]  Referring Provider  Dr. Lamonte Sakai      Encounter Date: 12/10/2015  Check In:     Session Check In - 12/10/15 1538      Check-In   Location [facility]   Staff Present Rosebud Poles, RN, BSN;Molly diVincenzo, MS, ACSM RCEP, Exercise Physiologist;Lisa Ysidro Evert, RN;Henessy Rohrer Rollene Rotunda, RN, BSN   Supervising physician immediately available to respond to emergencies [facility] immediately available   Physician(s) Dr. Marily Memos   Medication changes reported     No   Fall or balance concerns reported    No   Warm-up and Cool-down Performed as group-led instruction   Resistance Training Performed Yes   VAD Patient? No     Pain Assessment   Currently in Pain? No/denies   Multiple Pain Sites No      Capillary Blood Glucose: No results found for this or any previous visit (from the past 24 hour(s)).      Exercise Prescription Changes - 12/10/15 1550      Exercise Review   Progression Yes     Response to Exercise   Blood Pressure (Admit) 92/50   Blood Pressure (Exercise) 110/70   Blood Pressure (Exit) 106/60   Heart Rate (Admit) 57 bpm   Heart Rate (Exercise) 64 bpm   Heart Rate (Exit) 60 bpm   Oxygen Saturation (Admit) 97 %   Oxygen Saturation (Exercise) 98 %   Oxygen Saturation (Exit) 97 %   Rating of Perceived Exertion (Exercise) 13   Perceived Dyspnea (Exercise) 2   Duration Progress to 45 minutes of aerobic exercise without signs/symptoms of physical distress   Intensity THRR unchanged     Progression   Progression Continue to progress workloads to maintain intensity without signs/symptoms of physical distress.     Resistance Training   Training Prescription Yes   Weight orange bands   Reps 10-12  10 minutes of strength training     Interval Training   Interval Training No     Oxygen   Oxygen Continuous   Liters 3     NuStep   Level 4   Minutes 17   METs 2.1     Arm Ergometer   Level 2   Minutes 17     Track   Laps 11   Minutes 17     Goals Met:  Improved SOB with ADL's Exercise tolerated well No report of cardiac concerns or symptoms Strength training completed today  Goals Unmet:  Not Applicable  Comments: Service time is from 1330 to 1515   Dr. Rush Farmer is Medical Director for Pulmonary Rehab at [facility].

## 2015-12-10 NOTE — Telephone Encounter (Signed)
Called spoke with [facility] at pulmonary rehab. She states the pt has been showing interest in a POC. She states the patient is currently using 2L at rest 3L with activity at pulmonary rehab. She request a message be sent to RB to address a POC order. I explained to her that I would send the message for RB's approval and return her call. She voiced understanding and had no further questions.   RB please advise

## 2015-12-11 ENCOUNTER — Encounter: Payer: Self-pay | Admitting: Family Medicine

## 2015-12-11 ENCOUNTER — Ambulatory Visit (INDEPENDENT_AMBULATORY_CARE_PROVIDER_SITE_OTHER): Payer: Medicare Other | Admitting: Family Medicine

## 2015-12-11 VITALS — BP 127/61 | HR 61 | Wt 115.7 lb

## 2015-12-11 DIAGNOSIS — G479 Sleep disorder, unspecified: Secondary | ICD-10-CM

## 2015-12-11 DIAGNOSIS — J438 Other emphysema: Secondary | ICD-10-CM | POA: Diagnosis not present

## 2015-12-11 DIAGNOSIS — G252 Other specified forms of tremor: Secondary | ICD-10-CM | POA: Diagnosis not present

## 2015-12-11 DIAGNOSIS — E569 Vitamin deficiency, unspecified: Secondary | ICD-10-CM | POA: Diagnosis not present

## 2015-12-11 DIAGNOSIS — I10 Essential (primary) hypertension: Secondary | ICD-10-CM | POA: Diagnosis not present

## 2015-12-11 NOTE — Progress Notes (Signed)
F/up OV: Impression and Recommendations:    1. Essential hypertension   2. h/o Intention tremor    3. Sleep disorder   4. Other emphysema ([place])   5. Vitamin deficiency      We have taken patient off her hydrochlorothiazide and amlodipine within the past month. Her blood pressures continue to still be on the low side but she is asymptomatic. We will continue her propranolol for now as she is on the lowest dose and tells me she can't go w/o the b-blocker.    If her blood pressures continue, we can switch her to a different beta blocker especially one that may be better for her lungs like atenolol or Lopressor.  Cont to monitor BP at home.   Drink more water!   Switch to melatonin XR- 3-42m nightly.  Pt with awakenings in middle of the nite at times- ext release may work better for pt  Cont cardio-pulm rehab    Patient's Medications  New Prescriptions   No medications on file  Previous Medications   ALBUTEROL (PROVENTIL HFA;VENTOLIN HFA) 108 (90 BASE) MCG/ACT INHALER    Inhale 2 puffs into the lungs every 6 (six) hours as needed for wheezing or shortness of breath.   ALBUTEROL (PROVENTIL) (2.5 MG/3ML) 0.083% NEBULIZER SOLUTION    Take 3 mLs (2.5 mg total) by nebulization every 4 (four) hours as needed for wheezing or shortness of breath.   ASPIRIN 81 MG TABLET    Take 81 mg by mouth daily.   DIPHENHYDRAMINE HCL (BENADRYL ALLERGY PO)    Take 1 capsule by mouth at bedtime.   LEVOTHYROXINE (SYNTHROID, LEVOTHROID) 125 MCG TABLET    Take 1 tablet (125 mcg total) by mouth daily.   MELATONIN 3 MG TABS    Take 2 tablets by mouth at bedtime.   MULTIPLE VITAMINS-MINERALS (MULTIVITAMIN WITH MINERALS) TABLET    Take 1 tablet by mouth daily.   PROPRANOLOL ER (INDERAL LA) 60 MG 24 HR CAPSULE    Take 1 capsule (60 mg total) by mouth daily.   SIMVASTATIN (ZOCOR) 20 MG TABLET    Take 20 mg by mouth at bedtime.     STIOLTO RESPIMAT 2.5-2.5 MCG/ACT AERS    INHALE TWO PUFFS INTO THE LUNGS ONCE  DAILY  Modified Medications   No medications on file  Discontinued Medications   No medications on file    Return in about 4 months (around 04/12/2016) for f/up BP.  The patient was counseled, risk factors were discussed, anticipatory guidance given.  Gross side effects, risk and benefits, and alternatives of medications discussed with patient.  Patient is aware that all medications have potential side effects and we are unable to predict every side effect or drug-drug interaction that may occur.  Expresses verbal understanding and consents to current therapy plan and treatment regimen.  Please see AVS handed out to patient at the end of our visit for further patient instructions/ counseling done pertaining to today's office visit.    Note: This document was prepared using Dragon voice recognition software and may include unintentional dictation errors.   --------------------------------------------------------------------------------------------------------------------------------------------------------------------------------------------------------------------------------------------    Subjective:    CC:  Chief Complaint  Patient presents with  . Hypotension    HPI: [name] a 82y.o. [place] [facility]   HTN: BP has been low ever since she has been coming to me. Completely ASx- no lightheaded/  dizziness etc.   Off amlodipine since 11/28/15 and off HCTZ since  6/28.  Patient could not tolerate going off her beta blocker because her tremors were too bad.  The propranolol per patient does not make her pulmonary status worse.   Went to urologist due to recurrent Urinary burning sx w/o evidence infxn.  They put on tamsulosin-  she got itchy from and had to stop it due to S-E and macrobid abx.  They got CT scan per pt which I do not have those records.   Pt says they told her to come back in 3  mo--> planned to do a cystoscopy it sounds like.  Pt w/o sx now.  Told her we could prescribe abx in future.   Continues to do Pulm rehab twice wkly.  Helping   Patient Active Problem List   Diagnosis Date Noted  . S/P aortic valve replacement with stentless valve 03/18/2011    Priority: High  . Carotid artery disease ([place])     Priority: High  . Hypertension     Priority: High  . COPD (chronic obstructive pulmonary disease) with emphysema ([place]) 03/02/2011    Priority: High  . Osteopenia 11/05/2015    Priority: Medium  . h/o Intention tremor  11/05/2015    Priority: Medium  . Sleep disorder 12/11/2015  . Elevated hemoglobin A1c 11/13/2015  . Vitamin deficiency 11/13/2015  . 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 Allergic rhinitis 11/05/2015  . Hematuria 11/05/2015  . Hypothyroidism; on synthroid 11/05/2015  . h/o Hypercalcemia 11/05/2015  . h/o RLS (restless legs syndrome) 11/05/2015  . Fall at home 11/05/2015  . History of nephrolithiasis 11/05/2015  . Frequent urination 10/09/2015  . S/P aortic valve replacement and aortoplasty 04/13/2011  . h/o Thrombocytopenia ([place]) 03/23/2011  . h/o Atrial fibrillation ([place]) 03/22/2011  . Colon polyps     Past Medical history, Surgical history, Family history, Social history, Allergies and Medications have been entered into the medical record, reviewed and changed as needed.   Allergies:  Allergies  Allergen Reactions  . Clarithromycin     Mouth/throat sore  . Tamsulosin Itching  . Penicillins Itching and Rash    Review of Systems: No fever/ chills, night sweats, no unintended weight loss, No chest pain, or increased shortness of breath. No N/V/D.  Pertinent positives and negatives noted in HPI above    Objective:   Blood pressure 127/61, pulse 61, weight 115 lb 11.2 oz (52.5 kg). Body mass index is 20.99 kg/m.  General: Well Developed, well nourished, appropriate for stated age.  Neuro: Alert and oriented x3, extra-ocular  muscles intact, sensation grossly intact.  HEENT: Normocephalic, atraumatic, neck supple   Skin: Warm and dry, no gross rash. Cardiac: RRR, S1 S2,  no murmurs rubs or gallops.  Respiratory:  Inc length of exh phase and dec sounds of exhalation; Not using accessory muscles, speaking in full sentences- minimally labored breathing. Vascular:  No gross lower ext edema, cap RF less 2 sec. Psych: No SI/HI, Insight and judgement good

## 2015-12-11 NOTE — Patient Instructions (Addendum)
Cont pulm rehab 2/wk  Look into the silver sneakers- at [facility] or where ever.  Long acting melatonin- " extended release"   Dehydration Dehydration is when you lose more fluids from the body than you take in. Vital organs such as the kidneys, brain, and heart cannot function without a proper amount of fluids and salt. Any loss of fluids from the body can cause dehydration.  Older adults are at a higher risk of dehydration than younger adults. As we age, our bodies are less able to conserve water and do not respond to temperature changes as well. Also, older adults do not become thirsty as easily or quickly. Because of this, older adults often do not realize they need to increase fluids to avoid dehydration.  CAUSES   Vomiting.  Diarrhea.  Excessive sweating.  Excessive urination.  Fever.  Certain medicines, such as blood pressure medicines called diuretics.  Poorly controlled blood sugars. SIGNS AND SYMPTOMS  Mild dehydration:  Thirst.  Dry lips.  Slightly dry mouth. Moderate dehydration:  Very dry mouth.  Sunken eyes.  Skin does not bounce back quickly when lightly pinched and released.  Dark urine and decreased urine production.  Decreased tear production.  Headache. Severe dehydration:  Very dry mouth.  Extreme thirst.  Rapid, weak pulse (more than 100 beats per minute at rest).  Cold hands and feet.  Not able to sweat in spite of heat.  Rapid breathing.  Blue lips.  Confusion and lethargy.  Difficulty being awakened.  Minimal urine production.  No tears. DIAGNOSIS  Your health care provider will diagnose dehydration based on your symptoms and your exam. Blood and urine tests will help confirm the diagnosis. The diagnostic evaluation should also identify the cause of dehydration. TREATMENT  Treatment of mild or moderate dehydration can often be done at home by increasing the amount of fluids that you drink. It is best to drink small amounts of  fluid more often. Drinking too much at one time can make vomiting worse. Severe dehydration needs to be treated at the hospital. You may be given IV fluids that contain water and electrolytes. HOME CARE INSTRUCTIONS   Ask your health care provider about specific rehydration instructions.  Drink enough fluids to keep your urine clear or pale yellow.  Drink small amounts frequently if you have nausea and vomiting.  Eat as you normally do.  Avoid:  Foods or drinks high in sugar.  Carbonated drinks.  Juice.  Extremely hot or cold fluids.  Drinks with caffeine.  Fatty, greasy foods.  Alcohol.  Tobacco.  Overeating.  Gelatin desserts.  Wash your hands well to avoid spreading bacteria and viruses.  Only take over-the-counter or prescription medicines for pain, discomfort, or fever as directed by your health care provider.  Ask your health care provider if you should continue all prescribed and over-the-counter medicines.  Keep all follow-up appointments with your health care provider. SEEK MEDICAL CARE IF:  You have abdominal pain, and it increases or stays in one area (localizes).  You have a rash, stiff neck, or severe headache.  You are irritable, sleepy, or difficult to awaken.  You are weak, dizzy, or extremely thirsty.  You have a fever. SEEK IMMEDIATE MEDICAL CARE IF:   You are unable to keep fluids down, or you get worse despite treatment.  You have frequent episodes of vomiting or diarrhea.  You have blood or green matter (bile) in your vomit.  You have blood in your stool, or your stool looks  black and tarry.  You have not urinated in 6-8 hours, or you have only urinated a small amount of very dark urine.  You faint. MAKE SURE YOU:   Understand these instructions.  Will watch your condition.  Will get help right away if you are not doing well or get worse.   This information is not intended to replace advice given to you by your health care  provider. Make sure you discuss any questions you have with your health care provider.   Document Released: 07/25/2003 Document Revised: 05/09/2013 Document Reviewed: 01/09/2013 Elsevier Interactive Patient Education [employer].

## 2015-12-12 ENCOUNTER — Encounter (HOSPITAL_COMMUNITY)
Admission: RE | Admit: 2015-12-12 | Discharge: 2015-12-12 | Disposition: A | Discharge status: Home / Self Care | Payer: Medicare Other | Source: Ambulatory Visit | Attending: Emergency Medicine | Admitting: Emergency Medicine

## 2015-12-12 VITALS — Wt 113.8 lb

## 2015-12-12 DIAGNOSIS — J438 Other emphysema: Secondary | ICD-10-CM

## 2015-12-12 NOTE — Progress Notes (Signed)
Daily Session Note  Patient Details  Name: [name] MRN: [id number] Date of Birth: 11/28/1929 Referring Provider:   April Manson Pulmonary Rehab Walk Test from 11/12/2015 in [place]  Referring Provider  Dr. Lamonte Sakai      Encounter Date: 12/12/2015  Check In:     Session Check In - 12/12/15 1349      Check-In   Location [facility]   Staff Present Rosebud Poles, RN, BSN;Molly diVincenzo, MS, ACSM RCEP, Exercise Physiologist;Annedrea Rosezella [place], RN, MHA;Portia Rollene Rotunda, RN, BSN   Supervising physician immediately available to respond to emergencies [facility] immediately available   Physician(s) Dr. Waldron Labs   Medication changes reported     No   Fall or balance concerns reported    No   Warm-up and Cool-down Performed as group-led instruction   Resistance Training Performed Yes   VAD Patient? No     Pain Assessment   Currently in Pain? No/denies   Multiple Pain Sites No      Capillary Blood Glucose: No results found for this or any previous visit (from the past 24 hour(s)).      Exercise Prescription Changes - 12/12/15 1500      Exercise Review   Progression Yes     Response to Exercise   Blood Pressure (Admit) 102/56   Blood Pressure (Exercise) 108/66   Blood Pressure (Exit) 110/60   Heart Rate (Admit) 59 bpm   Heart Rate (Exercise) 71 bpm   Heart Rate (Exit) 56 bpm   Oxygen Saturation (Admit) 97 %   Oxygen Saturation (Exercise) 95 %   Oxygen Saturation (Exit) 98 %   Rating of Perceived Exertion (Exercise) 11   Perceived Dyspnea (Exercise) 2   Duration Progress to 45 minutes of aerobic exercise without signs/symptoms of physical distress   Intensity THRR unchanged     Progression   Progression Continue to progress workloads to maintain intensity without signs/symptoms of physical distress.     Resistance Training   Training Prescription Yes   Weight orange bands   Reps 10-12  10 minutes of  strength training     Interval Training   Interval Training No     Oxygen   Oxygen Continuous   Liters 3     NuStep   Level 4   Minutes 17   METs 2.8     Arm Ergometer   Level 3   Minutes 17     Goals Met:  Exercise tolerated well Strength training completed today  Goals Unmet:  Not Applicable  Comments: Service time is from 1330 to 1505    Dr. Rush Farmer is Medical Director for Pulmonary Rehab at [facility].

## 2015-12-12 NOTE — Telephone Encounter (Signed)
Attempted to contact pt. No answer, no option to leave a message. Will try back.  

## 2015-12-13 NOTE — Telephone Encounter (Signed)
lmtcb x1 for pt. 

## 2015-12-16 NOTE — Telephone Encounter (Signed)
Spoke with pt. She is aware that we will be ordering this POC for her. Order has been placed. Nothing further was needed.

## 2015-12-17 ENCOUNTER — Encounter (HOSPITAL_COMMUNITY)
Admission: RE | Admit: 2015-12-17 | Discharge: 2015-12-17 | Disposition: A | Discharge status: Home / Self Care | Payer: Medicare Other | Source: Ambulatory Visit | Attending: Emergency Medicine | Admitting: Emergency Medicine

## 2015-12-17 VITALS — Wt 114.9 lb

## 2015-12-17 DIAGNOSIS — J438 Other emphysema: Secondary | ICD-10-CM | POA: Diagnosis present

## 2015-12-17 NOTE — Progress Notes (Signed)
I have reviewed a Home Exercise Prescription with [name] . [name] is not currently exercising at home.  The patient was advised to walk 2-3 days a week for 30 minutes.  Vinita and I discussed how to progress their exercise prescription.  The patient stated that their goals were to be less short of breath and be able to get out of the house more.  The patient stated that they understand the exercise prescription.  We reviewed exercise guidelines, target heart rate during exercise, oxygen use, weather, home pulse oximeter, endpoints for exercise, and goals.  Patient is encouraged to come to me with any questions. I will continue to follow up with the patient to assist them with progression and safety.

## 2015-12-17 NOTE — Progress Notes (Signed)
Daily Session Note  Patient Details  Name: [name] MRN: [id number] Date of Birth: 04/15/1930 Referring Provider:   April Manson Pulmonary Rehab Walk Test from 11/12/2015 in [place]  Referring Provider  Dr. Lamonte Sakai      Encounter Date: 12/17/2015  Check In:     Session Check In - 12/17/15 1343      Check-In   Location [facility]   Staff Present Rosebud Poles, RN, BSN;Lisa Ysidro Evert, RN;Portia Rollene Rotunda, RN, BSN;Molly diVincenzo, MS, ACSM RCEP, Exercise Physiologist   Supervising physician immediately available to respond to emergencies [facility] immediately available   Physician(s) Dr. Marily Memos   Medication changes reported     No   Fall or balance concerns reported    No   Warm-up and Cool-down Performed as group-led instruction   Resistance Training Performed Yes   VAD Patient? No     Pain Assessment   Currently in Pain? No/denies   Multiple Pain Sites No      Capillary Blood Glucose: No results found for this or any previous visit (from the past 24 hour(s)).      Exercise Prescription Changes - 12/17/15 1500      Response to Exercise   Blood Pressure (Admit) 92/40   Blood Pressure (Exercise) 100/50   Blood Pressure (Exit) 112/70   Heart Rate (Admit) 60 bpm   Heart Rate (Exercise) 66 bpm   Heart Rate (Exit) 60 bpm   Oxygen Saturation (Admit) 96 %   Oxygen Saturation (Exercise) 94 %   Oxygen Saturation (Exit) 96 %   Rating of Perceived Exertion (Exercise) 13   Perceived Dyspnea (Exercise) 2   Duration Progress to 45 minutes of aerobic exercise without signs/symptoms of physical distress   Intensity THRR unchanged     Progression   Progression Continue to progress workloads to maintain intensity without signs/symptoms of physical distress.     Resistance Training   Training Prescription Yes   Weight orange bands   Reps 10-12  10 minutes of strength training     Interval Training   Interval Training  No     Oxygen   Oxygen Continuous   Liters 3     NuStep   Level 4   Minutes 17   METs 2.4     Arm Ergometer   Level 2   Minutes 17     Track   Laps 9   Minutes 17     Home Exercise Plan   Plans to continue exercise at Home   Frequency Add 3 additional days to program exercise sessions.     Goals Met:  Improved SOB with ADL's Using PLB without cueing & demonstrates good technique Exercise tolerated well Strength training completed today  Goals Unmet:  Not Applicable  Comments: Service time is from 1330 to 1500    Dr. Rush Farmer is Medical Director for Pulmonary Rehab at [facility].

## 2015-12-19 ENCOUNTER — Encounter (HOSPITAL_COMMUNITY)
Admission: RE | Admit: 2015-12-19 | Discharge: 2015-12-19 | Disposition: A | Discharge status: Home / Self Care | Payer: Medicare Other | Source: Ambulatory Visit | Attending: Emergency Medicine | Admitting: Emergency Medicine

## 2015-12-19 VITALS — Wt 114.9 lb

## 2015-12-19 DIAGNOSIS — J438 Other emphysema: Secondary | ICD-10-CM

## 2015-12-19 NOTE — Progress Notes (Signed)
Daily Session Note  Patient Details  Name: [name] MRN: [id number] Date of Birth: 1929-08-31 Referring Provider:   April Manson Pulmonary Rehab Walk Test from 11/12/2015 in [place]  Referring Provider  Dr. Lamonte Sakai      Encounter Date: 12/19/2015  Check In:     Session Check In - 12/19/15 1349      Check-In   Location [facility]   Staff Present Rosebud Poles, RN, BSN;Molly diVincenzo, MS, ACSM RCEP, Exercise Physiologist;Kendyl Bissonnette Ysidro Evert, RN;Portia Rollene Rotunda, RN, BSN   Supervising physician immediately available to respond to emergencies [facility] immediately available   Physician(s) Dr. Marily Memos   Medication changes reported     No   Fall or balance concerns reported    No   Warm-up and Cool-down Performed as group-led instruction   Resistance Training Performed Yes   VAD Patient? No     Pain Assessment   Currently in Pain? No/denies   Multiple Pain Sites No      Capillary Blood Glucose: No results found for this or any previous visit (from the past 24 hour(s)).      Exercise Prescription Changes - 12/19/15 1500      Response to Exercise   Blood Pressure (Admit) 104/60   Blood Pressure (Exercise) 170/60   Blood Pressure (Exit) 110/80   Heart Rate (Admit) 55 bpm   Heart Rate (Exercise) 65 bpm   Heart Rate (Exit) 61 bpm   Oxygen Saturation (Admit) 96 %   Oxygen Saturation (Exercise) 96 %   Oxygen Saturation (Exit) 97 %   Rating of Perceived Exertion (Exercise) 13   Perceived Dyspnea (Exercise) 1   Duration Progress to 45 minutes of aerobic exercise without signs/symptoms of physical distress   Intensity THRR unchanged     Progression   Progression Continue to progress workloads to maintain intensity without signs/symptoms of physical distress.     Resistance Training   Training Prescription Yes   Weight orange bands   Reps 10-12  10 minutes of strenght training     Interval Training   Interval  Training No     Oxygen   Oxygen Continuous   Liters 2     Arm Ergometer   Level 3   Minutes 17     Track   Laps 10   Minutes 17     Goals Met:  Exercise tolerated well No report of cardiac concerns or symptoms Strength training completed today  Goals Unmet:  Not Applicable  Comments: Service time is from 1330 to 1510. She attended Meditation class today with Jeanella Craze today.    Dr. Rush Farmer is Medical Director for Pulmonary Rehab at [facility].

## 2015-12-19 NOTE — Progress Notes (Signed)
[name] 82 y.o. female Nutrition Note Spoke with pt. Pt is at a normal wt. Pt with slow, chronic wt loss noted over several years. Per discussion, pt wt has decreased more rapidly since she started on oxygen. Pt reports her appetite decreased significantly with O2 supplementation. Pt feels her appetite has returned to "about normal." There are some ways the pt can make her eating habits healthier according to pt's Rate Your Plate results. Age-appropriate nutrition recommendations discussed in lieu of recommending dietary changes at this time. Pt does not avoid salty food "because my doctor told me I needed to eat more salt." Pt is aware that she is pre-diabetic. Pt expressed understanding of the information reviewed via feedback method.    Lab Results  Component Value Date   HGBA1C 6.0 (H) 11/08/2015   Nutrition Diagnosis ? Food-and nutrition-related knowledge deficit related to lack of exposure to information as related to diagnosis of pulmonary disease Nutrition Intervention ? Pt's individual nutrition plan and goals reviewed with pt. ? Benefits of adopting healthy eating habits discussed when pt's Rate Your Plate reviewed. ? Pt to attend the Nutrition and Lung Disease class ? Continual client-centered nutrition education by RD, as part of interdisciplinary care. Goal(s) 1. Identify food quantities necessary to maintain current wt or promote wt gain towards UBW range of 122-129 lb at graduation from pulmonary rehab. Monitor and Evaluate progress toward nutrition goal with team.   Derek Mound, M.Ed, RD, LDN, CDE 12/19/2015 3:06 PM

## 2015-12-24 ENCOUNTER — Encounter (HOSPITAL_COMMUNITY)
Admission: RE | Admit: 2015-12-24 | Discharge: 2015-12-24 | Disposition: A | Discharge status: Home / Self Care | Payer: Medicare Other | Source: Ambulatory Visit | Attending: Emergency Medicine | Admitting: Emergency Medicine

## 2015-12-24 VITALS — Wt 114.4 lb

## 2015-12-24 DIAGNOSIS — J438 Other emphysema: Secondary | ICD-10-CM | POA: Diagnosis not present

## 2015-12-24 NOTE — Progress Notes (Signed)
Daily Session Note  Patient Details  Name: [name] MRN: [id number] Date of Birth: 1929-06-07 Referring Provider:   April Manson Pulmonary Rehab Walk Test from 11/12/2015 in [place]  Referring Provider  Dr. Lamonte Sakai      Encounter Date: 12/24/2015  Check In:     Session Check In - 12/24/15 1329      Check-In   Location [facility]   Staff Present Rosebud Poles, RN, BSN;Clarisse Rodriges Ysidro Evert, Felipe Drone, RN, MHA;Molly diVincenzo, MS, ACSM RCEP, Exercise Physiologist   Supervising physician immediately available to respond to emergencies [facility] immediately available   Physician(s) Dr. Marily Memos   Medication changes reported     No   Fall or balance concerns reported    No   Warm-up and Cool-down Performed as group-led instruction   Resistance Training Performed Yes   VAD Patient? No     Pain Assessment   Currently in Pain? No/denies   Multiple Pain Sites No      Capillary Blood Glucose: No results found for this or any previous visit (from the past 24 hour(s)).      Exercise Prescription Changes - 12/24/15 1500      Response to Exercise   Blood Pressure (Admit) 104/64   Blood Pressure (Exit) 92/50   Heart Rate (Admit) 61 bpm   Heart Rate (Exercise) 72 bpm   Heart Rate (Exit) 65 bpm   Oxygen Saturation (Admit) 97 %   Oxygen Saturation (Exercise) 91 %   Oxygen Saturation (Exit) 97 %   Rating of Perceived Exertion (Exercise) 13   Perceived Dyspnea (Exercise) 2   Duration Progress to 45 minutes of aerobic exercise without signs/symptoms of physical distress   Intensity THRR unchanged     Progression   Progression Continue to progress workloads to maintain intensity without signs/symptoms of physical distress.     Resistance Training   Training Prescription Yes   Weight orange bands   Reps 10-12  10 minutes of strength training     Interval Training   Interval Training No     Oxygen   Oxygen  Continuous   Liters 2     NuStep   Level 4   Minutes 17   METs 2.5     Arm Ergometer   Level 3   Minutes 17     Track   Laps 9   Minutes 17     Goals Met:  Exercise tolerated well No report of cardiac concerns or symptoms Strength training completed today  Goals Unmet:  Not Applicable  Comments: Service time is from 1330 to 1500    Dr. Rush Farmer is Medical Director for Pulmonary Rehab at [facility].

## 2015-12-26 ENCOUNTER — Encounter (HOSPITAL_COMMUNITY)
Admission: RE | Admit: 2015-12-26 | Discharge: 2015-12-26 | Disposition: A | Discharge status: Home / Self Care | Payer: Medicare Other | Source: Ambulatory Visit | Attending: Emergency Medicine | Admitting: Emergency Medicine

## 2015-12-26 VITALS — Wt 115.5 lb

## 2015-12-26 DIAGNOSIS — J438 Other emphysema: Secondary | ICD-10-CM

## 2015-12-26 NOTE — Progress Notes (Signed)
Daily Session Note  Patient Details  Name: [name] MRN: [id number] Date of Birth: August 02, 1929 Referring Provider:   April Manson Pulmonary Rehab Walk Test from 11/12/2015 in [place]  Referring Provider  Dr. Lamonte Sakai      Encounter Date: 12/26/2015  Check In:     Session Check In - 12/26/15 1400      Check-In   Location [facility]   Staff Present Rosebud Poles, RN, BSN;Molly diVincenzo, MS, ACSM RCEP, Exercise Physiologist;Lisa Ysidro Evert, RN   Supervising physician immediately available to respond to emergencies [facility] immediately available   Physician(s) Dr. Marthenia Rolling   Medication changes reported     No   Fall or balance concerns reported    No   Warm-up and Cool-down Performed as group-led instruction   Resistance Training Performed Yes   VAD Patient? No     Pain Assessment   Currently in Pain? No/denies   Multiple Pain Sites No      Capillary Blood Glucose: No results found for this or any previous visit (from the past 24 hour(s)).      Exercise Prescription Changes - 12/26/15 1600      Response to Exercise   Blood Pressure (Admit) 94/60   Blood Pressure (Exercise) 120/60   Blood Pressure (Exit) 104/50   Heart Rate (Admit) 58 bpm   Heart Rate (Exercise) 70 bpm   Heart Rate (Exit) 58 bpm   Oxygen Saturation (Admit) 97 %   Oxygen Saturation (Exercise) 94 %   Oxygen Saturation (Exit) 98 %   Rating of Perceived Exertion (Exercise) 12   Perceived Dyspnea (Exercise) 3   Duration Progress to 45 minutes of aerobic exercise without signs/symptoms of physical distress   Intensity THRR unchanged     Progression   Progression Continue to progress workloads to maintain intensity without signs/symptoms of physical distress.     Resistance Training   Training Prescription Yes   Weight orange bands   Reps 10-12  10 minutes of strength training     Interval Training   Interval Training No     Oxygen   Oxygen Continuous   Liters 2     NuStep   Level 4   Minutes 17   METs 2.4     Track   Laps 9   Minutes 17     Goals Met:  Exercise tolerated well Strength training completed today  Goals Unmet:  Not Applicable  Comments: Service time is from 1330 to 1530    Dr. Rush Farmer is Medical Director for Pulmonary Rehab at [facility].

## 2015-12-26 NOTE — Progress Notes (Signed)
Pulmonary Individual Treatment Plan  Patient Details  Name: [name] MRN: [id number] Date of Birth: 02/23/1930 Referring Provider:   April Manson Pulmonary Rehab Walk Test from 11/12/2015 in [place]  Referring Provider  Dr. Lamonte Sakai      Initial Encounter Date:  Flowsheet Row Pulmonary Rehab Walk Test from 11/12/2015 in [place]  Date  11/12/15  Referring Provider  Dr. Lamonte Sakai      Visit Diagnosis: Other emphysema ([place])  Patient's Home Medications on Admission:   Current Outpatient Prescriptions:  .  albuterol (PROVENTIL HFA;VENTOLIN HFA) 108 (90 Base) MCG/ACT inhaler, Inhale 2 puffs into the lungs every 6 (six) hours as needed for wheezing or shortness of breath., Disp: 1 Inhaler, Rfl: 2 .  albuterol (PROVENTIL) (2.5 MG/3ML) 0.083% nebulizer solution, Take 3 mLs (2.5 mg total) by nebulization every 4 (four) hours as needed for wheezing or shortness of breath., Disp: 300 mL, Rfl: 5 .  aspirin 81 MG tablet, Take 81 mg by mouth daily., Disp: , Rfl:  .  DiphenhydrAMINE HCl (BENADRYL ALLERGY PO), Take 1 capsule by mouth at bedtime., Disp: , Rfl:  .  levothyroxine (SYNTHROID, LEVOTHROID) 125 MCG tablet, Take 1 tablet (125 mcg total) by mouth daily., Disp: 90 tablet, Rfl: 0 .  Melatonin 3 MG TABS, Take 2 tablets by mouth at bedtime., Disp: , Rfl:  .  Multiple Vitamins-Minerals (MULTIVITAMIN WITH MINERALS) tablet, Take 1 tablet by mouth daily., Disp: , Rfl:  .  propranolol ER (INDERAL LA) 60 MG 24 hr capsule, Take 1 capsule (60 mg total) by mouth daily., Disp: 90 capsule, Rfl: 0 .  simvastatin (ZOCOR) 20 MG tablet, Take 20 mg by mouth at bedtime.  , Disp: , Rfl:  .  STIOLTO RESPIMAT 2.5-2.5 MCG/ACT AERS, INHALE TWO PUFFS INTO THE LUNGS ONCE DAILY, Disp: 4 g, Rfl: 5  Past Medical History: Past Medical History:  Diagnosis Date  . Allergic rhinitis   . Aortic stenosis   . Carotid artery disease ([place])   . Colon polyps    . h/o Atrial fibrillation ([place]) 03/22/2011   Brief post op   . Hematuria 11/05/2015  . Hypercalcemia   . Hypertension   . Hypothyroid   . Osteopenia   . 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 Pressure urticaria   . RLS (restless legs syndrome)   . S/P aortic valve replacement and aortoplasty 04/13/2011   63m Medtronic Freestyle porcine aortic root   . S/P aortic valve replacement with stentless valve 03/18/2011    Tobacco Use: History  Smoking Status  . Former Smoker  . Packs/day: 0.90  . Years: 62.00  . Types: Cigarettes  . Quit date: 07/14/2010  Smokeless Tobacco  . Never Used    Labs: Recent Review Flowsheet Data    Labs for ITP Cardiac and Pulmonary Rehab Latest Ref Rng & Units 03/18/2011 03/19/2011 03/19/2011 03/19/2011 11/08/2015   Cholestrol 125 - 200 mg/dL - - - - 133   LDLCALC <130 mg/dL - - - - 53   HDL >=46 mg/dL - - - - 63   Trlycerides <150 mg/dL - - - - 84   Hemoglobin A1c <5.7 % - - - - 6.0(H)   PHART 7.350 - 7.400 - 7.316(L) 7.295(L) 7.359 -   PCO2ART 35.0 - 45.0 mmHg - 49.2(H) 50.8(H) 43.7 -   HCO3 20.0 - 24.0 mEq/L - 25.0(H) 24.6(H) 24.5(H) -   TCO2 0 - 100 mmol/L _0 -  ACIDBASEDEF 0.0 - 2.0 mmol/L - 1.0 2.0 1.0 -   O2SAT % - 97.0 97.0 99.0 -      Capillary Blood Glucose: Lab Results  Component Value Date   GLUCAP 86 03/24/2011   GLUCAP 123 (H) 03/23/2011   GLUCAP 114 (H) 03/23/2011   GLUCAP 111 (H) 03/23/2011   GLUCAP 88 03/23/2011     ADL UCSD:     Pulmonary Assessment Scores    Row Name 11/12/15 1442         ADL UCSD   ADL Phase Entry     SOB Score total 61        Pulmonary Function Assessment:   Exercise Target Goals:    Exercise Program Goal: Individual exercise prescription set with THRR, safety & activity barriers. Participant demonstrates ability to understand and report RPE using BORG scale, to self-measure pulse accurately, and to acknowledge the importance of the exercise prescription.  Exercise Prescription  Goal: Starting with aerobic activity 30 plus minutes a day, 3 days per week for initial exercise prescription. Provide home exercise prescription and guidelines that participant acknowledges understanding prior to discharge.  Activity Barriers & Risk Stratification:     Activity Barriers & Cardiac Risk Stratification - 11/11/15 1252      Activity Barriers & Cardiac Risk Stratification   Activity Barriers Shortness of Breath;Deconditioning  has knee pain with peddling a bicycle      6 Minute Walk:     6 Minute Walk    Row Name 11/12/15 1623         6 Minute Walk   Phase Initial     Distance 1005 feet     Walk Time 6 minutes     # of Rest Breaks 0     MPH 1.9     METS 2.45     RPE 11     Perceived Dyspnea  1     Symptoms No     Resting HR 72 bpm     Resting BP 96/60     Max Ex. HR 68 bpm     Max Ex. BP 120/50       Interval HR   Baseline HR 72     1 Minute HR 64     2 Minute HR 67     3 Minute HR 67     4 Minute HR 68     5 Minute HR 68     6 Minute HR 68     2 Minute Post HR 60     Interval Heart Rate? Yes       Interval Oxygen   Interval Oxygen? Yes     Baseline Oxygen Saturation % 95 %     Baseline Liters of Oxygen 3 L     1 Minute Oxygen Saturation % 96 %     1 Minute Liters of Oxygen 3 L     2 Minute Oxygen Saturation % 97 %     2 Minute Liters of Oxygen 3 L     3 Minute Oxygen Saturation % 94 %     3 Minute Liters of Oxygen 3 L     4 Minute Oxygen Saturation % 96 %     4 Minute Liters of Oxygen 3 L     5 Minute Oxygen Saturation % 96 %     5 Minute Liters of Oxygen 3 L     6 Minute Oxygen Saturation % 96 %  6 Minute Liters of Oxygen 3 L     2 Minute Post Oxygen Saturation % 97 %     2 Minute Post Liters of Oxygen 3 L        Initial Exercise Prescription:     Initial Exercise Prescription - 11/12/15 1600      Date of Initial Exercise RX and Referring Provider   Date 11/12/15   Referring Provider Dr. Lamonte Sakai     Oxygen   Oxygen  Continuous   Liters 3     NuStep   Level 1   Minutes 15   METs 1.3     Arm/Foot Ergometer   Level 1   Minutes 15     Track   Laps 5   Minutes 15     Prescription Details   Frequency (times per week) 2   Duration Progress to 45 minutes of aerobic exercise without signs/symptoms of physical distress     Intensity   THRR 40-80% of Max Heartrate 55-110   Ratings of Perceived Exertion 11-13   Perceived Dyspnea 0-4     Progression   Progression Continue progressive overload as per policy without signs/symptoms or physical distress.     Resistance Training   Training Prescription Yes   Weight Orange Bands   Reps 10-12      Perform Capillary Blood Glucose checks as needed.  Exercise Prescription Changes:     Exercise Prescription Changes    Row Name 11/21/15 1600 11/26/15 1600 12/03/15 1500 12/05/15 1600 12/10/15 1550     Exercise Review   Progression  -  - Yes Yes Yes     Response to Exercise   Blood Pressure (Admit) 116/56 94/50 1_0   Blood Pressure (Exercise) 108/56 96/50 130/56 124/72 110/70   Blood Pressure (Exit) 104/70 90/48 110/40 102/60 106/60   Heart Rate (Admit) 61 bpm 67 bpm 66 bpm 60 bpm 57 bpm   Heart Rate (Exercise) 59 bpm 69 bpm 61 bpm 63 bpm 64 bpm   Heart Rate (Exit) 58 bpm 58 bpm 57 bpm 53 bpm 60 bpm   Oxygen Saturation (Admit) 97 % 90 % 94 % 97 % 97 %   Oxygen Saturation (Exercise) 96 % 89 %  chest felt tight, used rescue inhaler, increased O2 to 3 L 94 % 96 % 98 %   Oxygen Saturation (Exit) 94 % 99 % 97 % 97 % 97 %   Rating of Perceived Exertion (Exercise) _1 Perceived Dyspnea (Exercise) _2 Duration Progress to 45 minutes of aerobic exercise without signs/symptoms of physical distress Progress to 45 minutes of aerobic exercise without signs/symptoms of physical distress Progress to 45 minutes of aerobic exercise without signs/symptoms of physical distress Progress to 45 minutes of aerobic exercise without  signs/symptoms of physical distress Progress to 45 minutes of aerobic exercise without signs/symptoms of physical distress   Intensity THRR New THRR New THRR New THRR unchanged THRR unchanged     Progression   Progression Continue to progress workloads to maintain intensity without signs/symptoms of physical distress. Continue to progress workloads to maintain intensity without signs/symptoms of physical distress. Continue to progress workloads to maintain intensity without signs/symptoms of physical distress. Continue to progress workloads to maintain intensity without signs/symptoms of physical distress. Continue to progress workloads to maintain intensity without signs/symptoms of physical distress.     Resistance Training   Training Prescription _3   Weight _0    Reps 10-12 10-12 10-12  10 minutes of strength training 10-12  10 minutes of strength training 10-12  10 minutes of strength training     Interval Training   Interval Training _1      Oxygen   Oxygen  - Continuous Continuous Continuous Continuous   Liters  - _2 NuStep   Level _3 Minutes _4 METs 1.8 2.2 2.3 2.1 2.1     Arm Ergometer   Level  - 1 1  - 2   Minutes  - 17 17  - 17     Track   Laps 11 3  used rescue inhaler  _5 Minutes _6 Row Name 12/12/15 1500 12/17/15 1500 12/19/15 1500 12/24/15 1500       Exercise Review   Progression Yes  -  -  -      Response to Exercise   Blood Pressure (Admit) 102/56 92/40 104/60 104/64    Blood Pressure (Exercise) 108/66 100/50 170/60  -    Blood Pressure (Exit) 110/60 112/70 110/80 92/50    Heart Rate (Admit) 59 bpm 60 bpm 55 bpm 61 bpm    Heart Rate (Exercise) 71 bpm 66 bpm 65 bpm 72 bpm    Heart Rate (Exit) 56 bpm 60 bpm 61 bpm 65 bpm    Oxygen Saturation (Admit) 97 % 96 % 96 % 97 %    Oxygen Saturation (Exercise) 95 % 94 % 96  % 91 %    Oxygen Saturation (Exit) 98 % 96 % 97 % 97 %    Rating of Perceived Exertion (Exercise) _7 Perceived Dyspnea (Exercise) _8 Duration Progress to 45 minutes of aerobic exercise without signs/symptoms of physical distress Progress to 45 minutes of aerobic exercise without signs/symptoms of physical distress Progress to 45 minutes of aerobic exercise without signs/symptoms of physical distress Progress to 45 minutes of aerobic exercise without signs/symptoms of physical distress    Intensity THRR unchanged THRR unchanged THRR unchanged THRR unchanged      Progression   Progression Continue to progress workloads to maintain intensity without signs/symptoms of physical distress. Continue to progress workloads to maintain intensity without signs/symptoms of physical distress. Continue to progress workloads to maintain intensity without signs/symptoms of physical distress. Continue to progress workloads to maintain intensity without signs/symptoms of physical distress.      Resistance Training   Training Prescription Yes Yes Yes Yes    Weight orange bands orange bands orange bands orange bands    Reps 10-12  10 minutes of strength training 10-12  10 minutes of strength training 10-12  10 minutes of strenght training 10-12  10 minutes of strength training      Interval Training   Interval Training No No No No      Oxygen   Oxygen Continuous Continuous Continuous Continuous    Liters _9 NuStep   Level 4 4  - 4    Minutes 17 17  - 17    METs 2.8 2.4  - 2.5      Arm Ergometer   Level _10 Minutes 94 49 67  [place]  - _0 Minutes  - _1 Home Exercise Plan   Plans to continue exercise at  - Home  -  -    Frequency  - Add 3 additional days to program exercise sessions.  -  -       Exercise Comments:     Exercise Comments    Row Name 11/28/15 3736 12/17/15 1540 12/26/15 0819       Exercise Comments Just  started program, has only exercised 2 sessions, too eary to see any progress, tolerating exercise well. Completed home exercise prescription today Patient is cont. to increase exercise intensity and improve stamina        Discharge Exercise Prescription (Final Exercise Prescription Changes):     Exercise Prescription Changes - 12/24/15 1500      Response to Exercise   Blood Pressure (Admit) 104/64   Blood Pressure (Exit) 92/50   Heart Rate (Admit) 61 bpm   Heart Rate (Exercise) 72 bpm   Heart Rate (Exit) 65 bpm   Oxygen Saturation (Admit) 97 %   Oxygen Saturation (Exercise) 91 %   Oxygen Saturation (Exit) 97 %   Rating of Perceived Exertion (Exercise) 13   Perceived Dyspnea (Exercise) 2   Duration Progress to 45 minutes of aerobic exercise without signs/symptoms of physical distress   Intensity THRR unchanged     Progression   Progression Continue to progress workloads to maintain intensity without signs/symptoms of physical distress.     Resistance Training   Training Prescription Yes   Weight orange bands   Reps 10-12  10 minutes of strength training     Interval Training   Interval Training No     Oxygen   Oxygen Continuous   Liters 2     NuStep   Level 4   Minutes 17   METs 2.5     Arm Ergometer   Level 3   Minutes 17     Track   Laps 9   Minutes 17       Nutrition:  Target Goals: Understanding of nutrition guidelines, daily intake of sodium <1591m, cholesterol <2032m calories 30% from fat and 7% or less from saturated fats, daily to have 5 or more servings of fruits and vegetables.  Biometrics:     Pre Biometrics - 11/11/15 1256      Pre Biometrics   Grip Strength 24 kg       Nutrition Therapy Plan and Nutrition Goals:     Nutrition Therapy & Goals - 12/19/15 1504      Nutrition Therapy   Diet General, Healthful     Personal Nutrition Goals   Personal Goal #1 Maintain current wt or gain wt to UBW range of 122-129 lb      Intervention Plan   Intervention Prescribe, educate and counsel regarding individualized specific dietary modifications aiming towards targeted core components such as weight, hypertension, lipid management, diabetes, heart failure and other comorbidities.   Expected Outcomes Short Term Goal: Understand basic principles of dietary content, such as calories, fat, sodium, cholesterol and nutrients.;Long Term Goal: Adherence to prescribed nutrition plan.      Nutrition Discharge: Rate Your Plate Scores:     Nutrition Assessments - 12/19/15 1504      Rate Your Plate Scores   Pre Score 50      Psychosocial: Target Goals: Acknowledge presence or absence  of depression, maximize coping skills, provide positive support system. Participant is able to verbalize types and ability to use techniques and skills needed for reducing stress and depression.  Initial Review & Psychosocial Screening:     Initial Psych Review & Screening - 11/11/15 1302      Initial Review   Current issues with --  none identified     Family Dynamics   Good Support System? Yes     Barriers   Psychosocial barriers to participate in program There are no identifiable barriers or psychosocial needs.     Screening Interventions   Interventions Encouraged to exercise      Quality of Life Scores:     Quality of Life - 11/12/15 1443      Quality of Life Scores   Health/Function Pre 12.32 %   Socioeconomic Pre 22.5 %   Psych/Spiritual Pre 19.92 %   Family Pre 2 %   GLOBAL Pre 15.18 %      PHQ-9: Recent Review Flowsheet Data    Depression screen [facility] 2/9 11/13/2015 11/11/2015 06/21/2015 06/08/2015 08/10/2011   Decreased Interest 0 0 0 0 0   Down, Depressed, Hopeless 0 0 0 0 0   PHQ - 2 Score 0 0 0 0 0      Psychosocial Evaluation and Intervention:     Psychosocial Evaluation - 11/11/15 1303      Psychosocial Evaluation & Interventions   Interventions Encouraged to exercise with the program and follow  exercise prescription   Comments none identified   Continued Psychosocial Services Needed No      Psychosocial Re-Evaluation:     Psychosocial Re-Evaluation    [place] Name 11/25/15 1133 12/24/15 0841           Psychosocial Re-Evaluation   Interventions Encouraged to attend Pulmonary Rehabilitation for the exercise Encouraged to attend Pulmonary Rehabilitation for the exercise      Comments No psychosocial concerns identified at this time. -  no psychosocial concerns identified      Continued Psychosocial Services Needed No No        Education: Education Goals: Education classes will be provided on a weekly basis, covering required topics. Participant will state understanding/return demonstration of topics presented.  Learning Barriers/Preferences:     Learning Barriers/Preferences - 11/11/15 1254      Learning Barriers/Preferences   Learning Barriers None   Learning Preferences Written Material;Group Instruction;Skilled Demonstration      Education Topics: Risk Factor Reduction:  -Group instruction that is supported by a PowerPoint presentation. Instructor discusses the definition of a risk factor, different risk factors for pulmonary disease, and how the heart and lungs work together.     Nutrition for Pulmonary Patient:  -Group instruction provided by PowerPoint slides, verbal discussion, and written materials to support subject matter. The instructor gives an explanation and review of healthy diet recommendations, which includes a discussion on weight management, recommendations for fruit and vegetable consumption, as well as protein, fluid, caffeine, fiber, sodium, sugar, and alcohol. Tips for eating when patients are short of breath are discussed.   Pursed Lip Breathing:  -Group instruction that is supported by demonstration and informational handouts. Instructor discusses the benefits of pursed lip and diaphragmatic breathing and detailed demonstration on how to  preform both.   Flowsheet Row PULMONARY REHAB OTHER RESPIRATORY from 12/12/2015 in [place]  Date  12/12/15  Educator  EP  Instruction Review Code  2- meets goals/outcomes  Oxygen Safety:  -Group instruction provided by PowerPoint, verbal discussion, and written material to support subject matter. There is an overview of "What is Oxygen" and "Why do we need it".  Instructor also reviews how to create a safe environment for oxygen use, the importance of using oxygen as prescribed, and the risks of noncompliance. There is a brief discussion on traveling with oxygen and resources the patient may utilize.   Oxygen Equipment:  -Group instruction provided by [facility] Staff utilizing handouts, written materials, and equipment demonstrations.   Signs and Symptoms:  -Group instruction provided by written material and verbal discussion to support subject matter. Warning signs and symptoms of infection, stroke, and heart attack are reviewed and when to call the physician/911 reinforced. Tips for preventing the spread of infection discussed.   Advanced Directives:  -Group instruction provided by verbal instruction and written material to support subject matter. Instructor reviews Advanced Directive laws and proper instruction for filling out document.   Pulmonary Video:  -Group video education that reviews the importance of medication and oxygen compliance, exercise, good nutrition, pulmonary hygiene, and pursed lip and diaphragmatic breathing for the pulmonary patient.   Exercise for the Pulmonary Patient:  -Group instruction that is supported by a PowerPoint presentation. Instructor discusses benefits of exercise, core components of exercise, frequency, duration, and intensity of an exercise routine, importance of utilizing pulse oximetry during exercise, safety while exercising, and options of places to exercise outside of rehab.   Flowsheet Row PULMONARY  REHAB OTHER RESPIRATORY from 12/12/2015 in [place]  Date  11/21/15  Educator  EP  Instruction Review Code  2- meets goals/outcomes      Pulmonary Medications:  -Verbally interactive group education provided by instructor with focus on inhaled medications and proper administration.   Anatomy and Physiology of the Respiratory System and Intimacy:  -Group instruction provided by PowerPoint, verbal discussion, and written material to support subject matter. Instructor reviews respiratory cycle and anatomical components of the respiratory system and their functions. Instructor also reviews differences in obstructive and restrictive respiratory diseases with examples of each. Intimacy, Sex, and Sexuality differences are reviewed with a discussion on how relationships can change when diagnosed with pulmonary disease. Common sexual concerns are reviewed. Flowsheet Row PULMONARY REHAB OTHER RESPIRATORY from 12/12/2015 in [place]  Date  12/05/15  Educator  RN  Instruction Review Code  2- meets goals/outcomes      Knowledge Questionnaire Score:     Knowledge Questionnaire Score - 11/12/15 1442      Knowledge Questionnaire Score   Pre Score 6/13      Core Components/Risk Factors/Patient Goals at Admission:     Personal Goals and Risk Factors at Admission - 11/11/15 1258      Core Components/Risk Factors/Patient Goals on Admission   Increase Strength and Stamina Yes   Intervention Provide advice, education, support and counseling about physical activity/exercise needs.;Develop an individualized exercise prescription for aerobic and resistive training based on initial evaluation findings, risk stratification, comorbidities and participant's personal goals.   Expected Outcomes Achievement of increased cardiorespiratory fitness and enhanced flexibility, muscular endurance and strength shown through measurements of functional  capacity and personal statement of participant.   Improve shortness of breath with ADL's Yes   Intervention Provide education, individualized exercise plan and daily activity instruction to help decrease symptoms of SOB with activities of daily living.   Expected Outcomes Short Term: Achieves a reduction of symptoms when performing activities  of daily living.      Core Components/Risk Factors/Patient Goals Review:      Goals and Risk Factor Review    Row Name 11/11/15 1300 11/25/15 1131 12/24/15 0839         Core Components/Risk Factors/Patient Goals Review   Personal Goals Review Increase Strength and Stamina;Improve shortness of breath with ADL's Increase Strength and Stamina;Improve shortness of breath with ADL's  -     Review increase strength, stamina with exercise in program Has attended less than 5 exercise sessions, too soon to see improvement. Is making progress, nustep up to level 4, arm ergometer up to level 3, walking 9-15 laps on track     Expected Outcomes Improved QOL and ability to perform ADL's with more ease Should see an improvement in the next 30 days in strength and stamina Continue to see increase in strength and stamina with workload increase        Core Components/Risk Factors/Patient Goals at Discharge (Final Review):      Goals and Risk Factor Review - 12/24/15 0839      Core Components/Risk Factors/Patient Goals Review   Review Is making progress, nustep up to level 4, arm ergometer up to level 3, walking 9-15 laps on track   Expected Outcomes Continue to see increase in strength and stamina with workload increase      ITP Comments:   Comments: ITP REVIEW Pt is making expected progress toward personal goals after completing 9 sessions.   Recommend continued exercise, life style modification, education, and utilization of breathing techniques to increase stamina and strength and decrease shortness of breath with exertion.

## 2015-12-31 ENCOUNTER — Encounter (HOSPITAL_COMMUNITY)
Admission: RE | Admit: 2015-12-31 | Discharge: 2015-12-31 | Disposition: A | Discharge status: Home / Self Care | Payer: Medicare Other | Source: Ambulatory Visit | Attending: Emergency Medicine | Admitting: Emergency Medicine

## 2015-12-31 VITALS — Wt 115.5 lb

## 2015-12-31 DIAGNOSIS — J438 Other emphysema: Secondary | ICD-10-CM | POA: Diagnosis not present

## 2015-12-31 NOTE — Progress Notes (Signed)
Daily Session Note  Patient Details  Name: [name] MRN: [id number] Date of Birth: [birth year] Referring Provider:   April Manson Pulmonary Rehab Walk Test from 11/12/2015 in [place]  Referring Provider  Dr. Lamonte Sakai      Encounter Date: 12/31/2015  Check In:     Session Check In - 12/31/15 1349      Check-In   Location [facility]   Staff Present Rosebud Poles, RN, BSN;Molly diVincenzo, MS, ACSM RCEP, Exercise Physiologist;Gawain Crombie Ysidro Evert, Felipe Drone, RN, MHA;Portia Rollene Rotunda, RN, BSN   Supervising physician immediately available to respond to emergencies [facility] immediately available   Physician(s) Dr. Marily Memos   Medication changes reported     No   Fall or balance concerns reported    No   Warm-up and Cool-down Performed as group-led instruction   Resistance Training Performed Yes   VAD Patient? No     Pain Assessment   Currently in Pain? No/denies   Multiple Pain Sites No      Capillary Blood Glucose: No results found for this or any previous visit (from the past 24 hour(s)).      Exercise Prescription Changes - 12/31/15 1500      Response to Exercise   Blood Pressure (Admit) 100/52   Blood Pressure (Exercise) 132/52   Blood Pressure (Exit) 110/62   Heart Rate (Admit) 62 bpm   Heart Rate (Exercise) 72 bpm   Heart Rate (Exit) 59 bpm   Oxygen Saturation (Admit) 96 %   Oxygen Saturation (Exercise) 94 %   Oxygen Saturation (Exit) 97 %   Rating of Perceived Exertion (Exercise) 11   Perceived Dyspnea (Exercise) 1   Duration Progress to 45 minutes of aerobic exercise without signs/symptoms of physical distress   Intensity THRR unchanged     Progression   Progression Continue to progress workloads to maintain intensity without signs/symptoms of physical distress.     Resistance Training   Training Prescription Yes   Weight orange bands   Reps 10-12  10 minutes of strength training     Interval Training   Interval Training No     Oxygen   Oxygen Continuous   Liters 2     NuStep   Level 4   Minutes 17   METs 2.4     Arm/Foot Ergometer   Level 3   Minutes 17     Track   Laps 10   Minutes 17     Goals Met:  Exercise tolerated well No report of cardiac concerns or symptoms Strength training completed today  Goals Unmet:  Not Applicable  Comments: Service time is from 1330 to 1510    Dr. Rush Farmer is Medical Director for Pulmonary Rehab at [facility].

## 2016-01-02 ENCOUNTER — Encounter (HOSPITAL_COMMUNITY)
Admission: RE | Admit: 2016-01-02 | Discharge: 2016-01-02 | Disposition: A | Discharge status: Home / Self Care | Payer: Medicare Other | Source: Ambulatory Visit | Attending: Emergency Medicine | Admitting: Emergency Medicine

## 2016-01-02 VITALS — Wt 116.6 lb

## 2016-01-02 DIAGNOSIS — J438 Other emphysema: Secondary | ICD-10-CM | POA: Diagnosis not present

## 2016-01-02 NOTE — Progress Notes (Addendum)
Daily Session Note  Patient Details  Name: [name] MRN: [id number] Date of Birth: Feb 27, 1930 Referring Provider:   April Manson Pulmonary Rehab Walk Test from 11/12/2015 in [place]  Referring Provider  Dr. Lamonte Sakai      Encounter Date: 01/02/2016  Check In:     Session Check In - 01/02/16 1503      Check-In   Location [facility]   Staff Present Rosebud Poles, RN, BSN;Ramon Dredge, RN, MHA;Avamae Dehaan, RN;Molly diVincenzo, MS, ACSM RCEP, Exercise Physiologist;Portia Rollene Rotunda, RN, BSN   Supervising physician immediately available to respond to emergencies [facility] immediately available   Physician(s) Dr. Marthenia Rolling   Medication changes reported     No   Fall or balance concerns reported    No   Warm-up and Cool-down Performed as group-led instruction   Resistance Training Performed Yes   VAD Patient? No     Pain Assessment   Currently in Pain? No/denies   Multiple Pain Sites No      Capillary Blood Glucose: No results found for this or any previous visit (from the past 24 hour(s)).      Exercise Prescription Changes - 01/02/16 1500      Exercise Review   Progression Yes     Response to Exercise   Blood Pressure (Admit) 142/60   Blood Pressure (Exercise) 130/64   Blood Pressure (Exit) 120/60   Heart Rate (Admit) 63 bpm   Heart Rate (Exercise) 73 bpm   Heart Rate (Exit) 60 bpm   Oxygen Saturation (Admit) 95 %   Oxygen Saturation (Exercise) 95 %   Oxygen Saturation (Exit) 97 %   Rating of Perceived Exertion (Exercise) 12   Perceived Dyspnea (Exercise) 2   Duration Progress to 45 minutes of aerobic exercise without signs/symptoms of physical distress   Intensity THRR unchanged     Progression   Progression Continue to progress workloads to maintain intensity without signs/symptoms of physical distress.     Resistance Training   Training Prescription Yes   Weight orange bands   Reps 10-12  10  minutes of strength training     Interval Training   Interval Training No     Oxygen   Oxygen Continuous   Liters 2     Arm/Foot Ergometer   Level 4   Minutes 17     Track   Laps 10   Minutes 17     Goals Met:  Exercise tolerated well No report of cardiac concerns or symptoms Strength training completed today  Goals Unmet:  Not Applicable  Comments: Service time is from 1330 to 1445    Dr. Rush Farmer is Medical Director for Pulmonary Rehab at [facility].

## 2016-01-07 ENCOUNTER — Encounter (HOSPITAL_COMMUNITY): Payer: Medicare Other

## 2016-01-08 ENCOUNTER — Other Ambulatory Visit: Payer: Self-pay | Admitting: Emergency Medicine

## 2016-01-09 ENCOUNTER — Encounter (HOSPITAL_COMMUNITY)
Admission: RE | Admit: 2016-01-09 | Discharge: 2016-01-09 | Disposition: A | Discharge status: Home / Self Care | Payer: Medicare Other | Source: Ambulatory Visit | Attending: Emergency Medicine | Admitting: Emergency Medicine

## 2016-01-09 VITALS — Wt 116.2 lb

## 2016-01-09 DIAGNOSIS — J438 Other emphysema: Secondary | ICD-10-CM

## 2016-01-09 NOTE — Progress Notes (Signed)
Daily Session Note  Patient Details  Name: [name] MRN: [id number] Date of Birth: May 01, 1930 Referring Provider:   April Manson Pulmonary Rehab Walk Test from 11/12/2015 in [place]  Referring Provider  Dr. Lamonte Sakai      Encounter Date: 01/09/2016  Check In:     Session Check In - 01/09/16 1349      Check-In   [facility]   Staff Present Rosebud Poles, RN, BSN;Ramon Dredge, RN, MHA;Delaila Nand, RN;Molly diVincenzo, MS, ACSM RCEP, Exercise Physiologist;Portia Rollene Rotunda, RN, BSN   Supervising physician immediately available to respond to emergencies [facility] immediately available   Physician(s) Dr. Allyson Sabal   Medication changes reported     No   Fall or balance concerns reported    No   Warm-up and Cool-down Performed as group-led instruction   Resistance Training Performed Yes   VAD Patient? No     Pain Assessment   Currently in Pain? No/denies   Multiple Pain Sites No      Capillary Blood Glucose: No results found for this or any previous visit (from the past 24 hour(s)).      Exercise Prescription Changes - 01/09/16 1600      Response to Exercise   Blood Pressure (Admit) 104/54   Blood Pressure (Exercise) 132/64   Blood Pressure (Exit) 112/68   Heart Rate (Admit) 55 bpm   Heart Rate (Exercise) 60 bpm   Heart Rate (Exit) 52 bpm   Oxygen Saturation (Admit) 96 %   Oxygen Saturation (Exercise) 90 %   Oxygen Saturation (Exit) 98 %   Rating of Perceived Exertion (Exercise) 11   Perceived Dyspnea (Exercise) 1   Duration Progress to 45 minutes of aerobic exercise without signs/symptoms of physical distress   Intensity THRR unchanged     Progression   Progression Continue to progress workloads to maintain intensity without signs/symptoms of physical distress.     Resistance Training   Training Prescription Yes   Weight orange bands   Reps 10-12  10 minutes of strength training     Interval  Training   Interval Training No     Oxygen   Oxygen Continuous   Liters 2     Arm/Foot Ergometer   Level 4   Minutes 17     Track   Laps 10   Minutes 17     Goals Met:  Exercise tolerated well No report of cardiac concerns or symptoms Strength training completed today  Goals Unmet:  Not Applicable  Comments: Service time is from 1330 to 1530    Dr. Rush Farmer is Medical Director for Pulmonary Rehab at [facility].

## 2016-01-14 ENCOUNTER — Encounter (HOSPITAL_COMMUNITY)
Admission: RE | Admit: 2016-01-14 | Discharge: 2016-01-14 | Disposition: A | Discharge status: Home / Self Care | Payer: Medicare Other | Source: Ambulatory Visit | Attending: Emergency Medicine | Admitting: Emergency Medicine

## 2016-01-14 VITALS — Wt 117.7 lb

## 2016-01-14 DIAGNOSIS — J438 Other emphysema: Secondary | ICD-10-CM

## 2016-01-14 NOTE — Progress Notes (Signed)
Daily Session Note  Patient Details  Name: [name] MRN: [id number] Date of Birth: 09/01/1929 Referring Provider:   April Manson Pulmonary Rehab Walk Test from 11/12/2015 in [place]  Referring Provider  Dr. Lamonte Sakai      Encounter Date: 01/14/2016  Check In:     Session Check In - 01/14/16 1332      Check-In   Location [facility]   Staff Present Rosebud Poles, RN, BSN;Monterrio Gerst Ysidro Evert, RN;Portia Rollene Rotunda, RN, BSN;Molly diVincenzo, MS, ACSM RCEP, Exercise Physiologist   Supervising physician immediately available to respond to emergencies [facility] immediately available   Physician(s) Dr. Marily Memos   Medication changes reported     No   Fall or balance concerns reported    No   Warm-up and Cool-down Performed as group-led instruction   Resistance Training Performed Yes     Pain Assessment   Currently in Pain? No/denies   Multiple Pain Sites No      Capillary Blood Glucose: No results found for this or any previous visit (from the past 24 hour(s)).      Exercise Prescription Changes - 01/14/16 1500      Response to Exercise   Blood Pressure (Admit) 118/64   Blood Pressure (Exercise) 104/64   Blood Pressure (Exit) 110/60   Heart Rate (Admit) 59 bpm   Heart Rate (Exercise) 68 bpm   Heart Rate (Exit) 59 bpm   Oxygen Saturation (Admit) 97 %   Oxygen Saturation (Exercise) 92 %   Oxygen Saturation (Exit) 97 %   Rating of Perceived Exertion (Exercise) 15   Perceived Dyspnea (Exercise) 2   Duration Progress to 45 minutes of aerobic exercise without signs/symptoms of physical distress   Intensity THRR unchanged     Progression   Progression Continue to progress workloads to maintain intensity without signs/symptoms of physical distress.     Resistance Training   Training Prescription Yes   Weight orange bands   Reps 10-12  10 minutes of strength training     Interval Training   Interval Training No     Oxygen    Oxygen Continuous   Liters 2     NuStep   Level 4   Minutes 17   METs 2.4     Arm/Foot Ergometer   Level 4   Minutes 17     Track   Laps 9   Minutes 17     Goals Met:  Exercise tolerated well No report of cardiac concerns or symptoms Strength training completed today  Goals Unmet:  Not Applicable  Comments: Service time is from 1330 to 1500    Dr. Rush Farmer is Medical Director for Pulmonary Rehab at [facility].

## 2016-01-16 ENCOUNTER — Encounter (HOSPITAL_COMMUNITY)
Admission: RE | Admit: 2016-01-16 | Discharge: 2016-01-16 | Disposition: A | Discharge status: Home / Self Care | Payer: Medicare Other | Source: Ambulatory Visit | Attending: Emergency Medicine | Admitting: Emergency Medicine

## 2016-01-16 VITALS — Wt 116.2 lb

## 2016-01-16 DIAGNOSIS — J438 Other emphysema: Secondary | ICD-10-CM

## 2016-01-16 NOTE — Progress Notes (Signed)
Daily Session Note  Patient Details  Name: [name] MRN: [id number] Date of Birth: 06-Apr-1930 Referring Provider:   April Manson Pulmonary Rehab Walk Test from 11/12/2015 in [place]  Referring Provider  Dr. Lamonte Sakai      Encounter Date: 01/16/2016  Check In:     Session Check In - 01/16/16 1333      Check-In   Location [facility]   Staff Present Rosebud Poles, RN, BSN;Merrianne Mccumbers, MS, ACSM RCEP, Exercise Physiologist;Lisa Ysidro Evert, Felipe Drone, RN, MHA;Portia Rollene Rotunda, RN, BSN   Supervising physician immediately available to respond to emergencies [facility] immediately available   Physician(s) Dr. Marily Memos   Medication changes reported     No   Fall or balance concerns reported    No   Warm-up and Cool-down Performed as group-led instruction   Resistance Training Performed Yes   VAD Patient? No     Pain Assessment   Currently in Pain? No/denies   Multiple Pain Sites No      Capillary Blood Glucose: No results found for this or any previous visit (from the past 24 hour(s)).      Exercise Prescription Changes - 01/16/16 1500      Response to Exercise   Blood Pressure (Admit) 98/54   Blood Pressure (Exercise) 136/60   Blood Pressure (Exit) 100/60   Heart Rate (Admit) 58 bpm   Heart Rate (Exercise) 67 bpm   Heart Rate (Exit) 59 bpm   Oxygen Saturation (Admit) 97 %   Oxygen Saturation (Exercise) 97 %   Oxygen Saturation (Exit) 98 %   Rating of Perceived Exertion (Exercise) 13   Perceived Dyspnea (Exercise) 2   Duration Progress to 45 minutes of aerobic exercise without signs/symptoms of physical distress   Intensity THRR unchanged     Progression   Progression Continue to progress workloads to maintain intensity without signs/symptoms of physical distress.     Resistance Training   Training Prescription Yes   Weight orange bands   Reps 10-12  10 minutes of strength training     Interval  Training   Interval Training No     Oxygen   Oxygen Continuous   Liters 2     NuStep   Level 4   Minutes 17   METs 2.6     Arm Ergometer   Level 4   Minutes 17     Goals Met:  Exercise tolerated well No report of cardiac concerns or symptoms Strength training completed today  Goals Unmet:  Not Applicable  Comments: Service time is from 1:30pm to 3:05pm    Dr. Rush Farmer is Medical Director for Pulmonary Rehab at [facility].

## 2016-01-16 NOTE — Progress Notes (Signed)
Daily Session Note  Patient Details  Name: [name] MRN: [id number] Date of Birth: 01/17/1930 Referring Provider:   Flowsheet Row Pulmonary Rehab Walk Test from 11/12/2015 in [facility]  Referring Provider  Dr. Byrum      Encounter Date: 01/16/2016  Check In:     Session Check In - 01/16/16 1333      Check-In   Location [facility]   Staff Present Joan Behrens, RN, BSN;Wille Aubuchon, MS, ACSM RCEP, Exercise Physiologist;Lisa Hughes, RN;Annedrea Stackhouse, RN, MHA;Portia Payne, RN, BSN   Supervising physician immediately available to respond to emergencies [facility] immediately available   Physician(s) Dr. Merrell   Medication changes reported     No   Fall or balance concerns reported    No   Warm-up and Cool-down Performed as group-led instruction   Resistance Training Performed Yes   VAD Patient? No     Pain Assessment   Currently in Pain? No/denies   Multiple Pain Sites No      Capillary Blood Glucose: No results found for this or any previous visit (from the past 24 hour(s)).      Exercise Prescription Changes - 01/16/16 1500      Response to Exercise   Blood Pressure (Admit) 98/54   Blood Pressure (Exercise) 136/60   Blood Pressure (Exit) 100/60   Heart Rate (Admit) 58 bpm   Heart Rate (Exercise) 67 bpm   Heart Rate (Exit) 59 bpm   Oxygen Saturation (Admit) 97 %   Oxygen Saturation (Exercise) 97 %   Oxygen Saturation (Exit) 98 %   Rating of Perceived Exertion (Exercise) 13   Perceived Dyspnea (Exercise) 2   Duration Progress to 45 minutes of aerobic exercise without signs/symptoms of physical distress   Intensity THRR unchanged     Progression   Progression Continue to progress workloads to maintain intensity without signs/symptoms of physical distress.     Resistance Training   Training Prescription Yes   Weight orange bands   Reps 10-12  10 minutes of strength training     Interval  Training   Interval Training No     Oxygen   Oxygen Continuous   Liters 2     NuStep   Level 4   Minutes 17   METs 2.6     Arm Ergometer   Level 4   Minutes 17     Goals Met:  Exercise tolerated well No report of cardiac concerns or symptoms Strength training completed today  Goals Unmet:  Not Applicable  Comments: Service time is from 1:30pm to 3:05pm    Dr. Wesam G. Yacoub is Medical Director for Pulmonary Rehab at [facility]. 

## 2016-01-21 ENCOUNTER — Encounter (HOSPITAL_COMMUNITY)
Admission: RE | Admit: 2016-01-21 | Discharge: 2016-01-21 | Disposition: A | Discharge status: Home / Self Care | Payer: Medicare Other | Source: Ambulatory Visit | Attending: Emergency Medicine | Admitting: Emergency Medicine

## 2016-01-21 VITALS — Wt 116.0 lb

## 2016-01-21 DIAGNOSIS — J438 Other emphysema: Secondary | ICD-10-CM | POA: Diagnosis not present

## 2016-01-21 NOTE — Progress Notes (Signed)
Daily Session Note  Patient Details  Name: [name] MRN: [id number] Date of Birth: Feb 18, 1930 Referring Provider:   April Manson Pulmonary Rehab Walk Test from 11/12/2015 in [place]  Referring Provider  Dr. Lamonte Sakai      Encounter Date: 01/21/2016  Check In:     Session Check In - 01/21/16 1340      Check-In   Location [facility]   Staff Present Rosebud Poles, RN, BSN;Lakiyah Arntson Ysidro Evert, Felipe Drone, RN, MHA;Portia Rollene Rotunda, RN, BSN   Supervising physician immediately available to respond to emergencies [facility] immediately available   Physician(s) Dr. Marily Memos   Medication changes reported     No   Fall or balance concerns reported    No   Warm-up and Cool-down Performed as group-led instruction   Resistance Training Performed Yes   VAD Patient? No     Pain Assessment   Currently in Pain? No/denies   Multiple Pain Sites No      Capillary Blood Glucose: No results found for this or any previous visit (from the past 24 hour(s)).      Exercise Prescription Changes - 01/21/16 1600      Response to Exercise   Blood Pressure (Admit) 118/64   Blood Pressure (Exercise) 148/70   Blood Pressure (Exit) 110/64   Heart Rate (Admit) 59 bpm   Heart Rate (Exercise) 67 bpm   Heart Rate (Exit) 54 bpm   Oxygen Saturation (Admit) 97 %   Oxygen Saturation (Exercise) 96 %   Oxygen Saturation (Exit) 98 %   Rating of Perceived Exertion (Exercise) 13   Perceived Dyspnea (Exercise) 1   Duration Progress to 45 minutes of aerobic exercise without signs/symptoms of physical distress   Intensity THRR unchanged     Progression   Progression Continue to progress workloads to maintain intensity without signs/symptoms of physical distress.     Resistance Training   Training Prescription Yes   Weight orange bands   Reps 10-12  10 minutes of strength training     Interval Training   Interval Training No     Oxygen   Oxygen Continuous   Liters 2     NuStep   Level 4   Minutes 17   METs 2.8     Arm Ergometer   Level 4   Minutes 17     Track   Laps 13   Minutes 17     Goals Met:  Exercise tolerated well No report of cardiac concerns or symptoms Strength training completed today  Goals Unmet:  Not Applicable  Comments: Service time is from 1330 to 1515     Dr. Rush Farmer is Medical Director for Pulmonary Rehab at [facility].

## 2016-01-23 ENCOUNTER — Encounter (HOSPITAL_COMMUNITY)
Admission: RE | Admit: 2016-01-23 | Discharge: 2016-01-23 | Disposition: A | Discharge status: Home / Self Care | Payer: Medicare Other | Source: Ambulatory Visit | Attending: Emergency Medicine | Admitting: Emergency Medicine

## 2016-01-23 DIAGNOSIS — J438 Other emphysema: Secondary | ICD-10-CM

## 2016-01-23 NOTE — Progress Notes (Signed)
Daily Session Note  Patient Details  Name: [name] MRN: [id number] Date of Birth: [birth year] Referring Provider:   April Manson Pulmonary Rehab Walk Test from 11/12/2015 in [place]  Referring Provider  Dr. Lamonte Sakai      Encounter Date: 01/23/2016  Check In:     Session Check In - 01/23/16 1330      Check-In   Location [facility]   Staff Present Rosebud Poles, RN, BSN;Molly diVincenzo, MS, ACSM RCEP, Exercise Physiologist;Annedrea Rosezella [place], RN, MHA;Portia Rollene Rotunda, RN, BSN   Supervising physician immediately available to respond to emergencies [facility] immediately available   Physician(s) Dr. Tor Netters   Medication changes reported     No   Fall or balance concerns reported    No   Warm-up and Cool-down Performed as group-led instruction   Resistance Training Performed Yes   VAD Patient? No     Pain Assessment   Currently in Pain? No/denies   Multiple Pain Sites No      Capillary Blood Glucose: No results found for this or any previous visit (from the past 24 hour(s)).      Exercise Prescription Changes - 01/23/16 1600      Response to Exercise   Blood Pressure (Admit) 100/52   Blood Pressure (Exercise) 150/78   Blood Pressure (Exit) 96/60   Heart Rate (Admit) 62 bpm   Heart Rate (Exercise) 69 bpm   Heart Rate (Exit) 61 bpm   Oxygen Saturation (Admit) 97 %   Oxygen Saturation (Exercise) 98 %   Oxygen Saturation (Exit) 98 %   Rating of Perceived Exertion (Exercise) 13   Perceived Dyspnea (Exercise) 2   Duration Progress to 45 minutes of aerobic exercise without signs/symptoms of physical distress   Intensity THRR unchanged     Progression   Progression Continue to progress workloads to maintain intensity without signs/symptoms of physical distress.     Resistance Training   Training Prescription Yes   Weight orange bands   Reps 10-12  10 minutes of strength training     Interval Training   Interval Training No     Oxygen   Oxygen Continuous   Liters 2     NuStep   Level 4   Minutes 17   METs 2.6     Track   Laps 10   Minutes 17     Goals Met:  Independence with exercise equipment Improved SOB with ADL's Using PLB without cueing & demonstrates good technique Exercise tolerated well Strength training completed today  Goals Unmet:  Not Applicable  Comments: Service time is from 1330 to 1515    Dr. Rush Farmer is Medical Director for Pulmonary Rehab at [facility].

## 2016-01-28 ENCOUNTER — Encounter (HOSPITAL_COMMUNITY)
Admission: RE | Admit: 2016-01-28 | Discharge: 2016-01-28 | Disposition: A | Discharge status: Home / Self Care | Payer: Medicare Other | Source: Ambulatory Visit | Attending: Emergency Medicine | Admitting: Emergency Medicine

## 2016-01-28 DIAGNOSIS — J438 Other emphysema: Secondary | ICD-10-CM | POA: Diagnosis not present

## 2016-01-28 NOTE — Progress Notes (Signed)
Daily Session Note  Patient Details  Name: [name] MRN: [id number] Date of Birth: [birth year] Referring Provider:   April Manson Pulmonary Rehab Walk Test from 11/12/2015 in [place]  Referring Provider  Dr. Lamonte Sakai      Encounter Date: 01/28/2016  Check In:     Session Check In - 01/28/16 1340      Check-In   Location [facility]   Staff Present Su Hilt, MS, ACSM RCEP, Exercise Physiologist;Blyss Lugar Rollene Rotunda, RN, BSN;Lisa Ysidro Evert, Felipe Drone, RN, [facility]   Supervising physician immediately available to respond to emergencies [facility] immediately available   Physician(s) Dr. Marily Memos   Medication changes reported     No   Fall or balance concerns reported    No   Warm-up and Cool-down Performed as group-led instruction   Resistance Training Performed Yes   VAD Patient? No     Pain Assessment   Currently in Pain? No/denies   Multiple Pain Sites No      Capillary Blood Glucose: No results found for this or any previous visit (from the past 24 hour(s)).      Exercise Prescription Changes - 01/28/16 1547      Exercise Review   Progression Yes     Response to Exercise   Blood Pressure (Admit) 102/54   Blood Pressure (Exercise) 106/54   Blood Pressure (Exit) 90/40   Heart Rate (Admit) 64 bpm   Heart Rate (Exercise) 66 bpm   Heart Rate (Exit) 61 bpm   Oxygen Saturation (Admit) 96 %   Oxygen Saturation (Exercise) 96 %   Oxygen Saturation (Exit) 94 %   Rating of Perceived Exertion (Exercise) 15   Perceived Dyspnea (Exercise) 1   Duration Progress to 45 minutes of aerobic exercise without signs/symptoms of physical distress   Intensity THRR unchanged     Progression   Progression Continue to progress workloads to maintain intensity without signs/symptoms of physical distress.     Resistance Training   Training Prescription Yes   Weight orange bands   Reps 10-12  10 minutes of strength  training     Interval Training   Interval Training No     Oxygen   Oxygen Continuous   Liters 2     NuStep   Level 4   Minutes 17   METs 2.6     Arm Ergometer   Level 5   Minutes 17     Track   Laps 10   Minutes 17     Goals Met:  Improved SOB with ADL's Using PLB without cueing & demonstrates good technique Changing diet to healthy choices, watching portion sizes Exercise tolerated well No report of cardiac concerns or symptoms Strength training completed today  Goals Unmet:  Not Applicable  Comments: Service time is from 1330 to 1500   Dr. Rush Farmer is Medical Director for Pulmonary Rehab at [facility].

## 2016-01-30 ENCOUNTER — Encounter (HOSPITAL_COMMUNITY)
Admission: RE | Admit: 2016-01-30 | Discharge: 2016-01-30 | Disposition: A | Discharge status: Home / Self Care | Payer: Medicare Other | Source: Ambulatory Visit | Attending: Emergency Medicine | Admitting: Emergency Medicine

## 2016-01-30 VITALS — Wt 117.1 lb

## 2016-01-30 DIAGNOSIS — J438 Other emphysema: Secondary | ICD-10-CM | POA: Diagnosis not present

## 2016-01-30 NOTE — Progress Notes (Signed)
Pulmonary Individual Treatment Plan  Patient Details  Name: [name] MRN: [id number] Date of Birth: [birth year] Referring Provider:   April Manson Pulmonary Rehab Walk Test from 11/12/2015 in [place]  Referring Provider  Dr. Lamonte Sakai      Initial Encounter Date:  Flowsheet Row Pulmonary Rehab Walk Test from 11/12/2015 in [place]  Date  11/12/15  Referring Provider  Dr. Lamonte Sakai      Visit Diagnosis: Other emphysema ([place])  Patient's Home Medications on Admission:   Current Outpatient Prescriptions:  .  albuterol (PROVENTIL HFA;VENTOLIN HFA) 108 (90 Base) MCG/ACT inhaler, Inhale 2 puffs into the lungs every 6 (six) hours as needed for wheezing or shortness of breath., Disp: 1 Inhaler, Rfl: 2 .  albuterol (PROVENTIL) (2.5 MG/3ML) 0.083% nebulizer solution, Take 3 mLs (2.5 mg total) by nebulization every 4 (four) hours as needed for wheezing or shortness of breath., Disp: 300 mL, Rfl: 5 .  aspirin 81 MG tablet, Take 81 mg by mouth daily., Disp: , Rfl:  .  DiphenhydrAMINE HCl (BENADRYL ALLERGY PO), Take 1 capsule by mouth at bedtime., Disp: , Rfl:  .  levothyroxine (SYNTHROID, LEVOTHROID) 125 MCG tablet, Take 1 tablet (125 mcg total) by mouth daily., Disp: 90 tablet, Rfl: 0 .  Melatonin 3 MG TABS, Take 2 tablets by mouth at bedtime., Disp: , Rfl:  .  Multiple Vitamins-Minerals (MULTIVITAMIN WITH MINERALS) tablet, Take 1 tablet by mouth daily., Disp: , Rfl:  .  propranolol ER (INDERAL LA) 60 MG 24 hr capsule, Take 1 capsule (60 mg total) by mouth daily., Disp: 90 capsule, Rfl: 0 .  simvastatin (ZOCOR) 20 MG tablet, Take 20 mg by mouth at bedtime.  , Disp: , Rfl:  .  STIOLTO RESPIMAT 2.5-2.5 MCG/ACT AERS, INHALE TWO PUFFS INTO THE LUNGS ONCE DAILY, Disp: 1 Inhaler, Rfl: 5  Past Medical History: Past Medical History:  Diagnosis Date  . Allergic rhinitis   . Aortic stenosis   . Carotid artery disease ([place])   . Colon  polyps   . h/o Atrial fibrillation ([place]) 03/22/2011   Brief post op   . Hematuria 11/05/2015  . Hypercalcemia   . Hypertension   . Hypothyroid   . Osteopenia   . 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 Pressure urticaria   . RLS (restless legs syndrome)   . S/P aortic valve replacement and aortoplasty 04/13/2011   65m Medtronic Freestyle porcine aortic root   . S/P aortic valve replacement with stentless valve 03/18/2011    Tobacco Use: History  Smoking Status  . Former Smoker  . Packs/day: 0.90  . Years: 62.00  . Types: Cigarettes  . Quit date: 07/14/2010  Smokeless Tobacco  . Never Used    Labs: Recent Review Flowsheet Data    Labs for ITP Cardiac and Pulmonary Rehab Latest Ref Rng & Units 03/18/2011 03/19/2011 03/19/2011 03/19/2011 11/08/2015   Cholestrol 125 - 200 mg/dL - - - - 133   LDLCALC <130 mg/dL - - - - 53   HDL >=46 mg/dL - - - - 63   Trlycerides <150 mg/dL - - - - 84   Hemoglobin A1c <5.7 % - - - - 6.0(H)   PHART 7.350 - 7.400 - 7.316(L) 7.295(L) 7.359 -   PCO2ART 35.0 - 45.0 mmHg - 49.2(H) 50.8(H) 43.7 -   HCO3 20.0 - 24.0 mEq/L - 25.0(H) 24.6(H) 24.5(H) -   TCO2 0 - 100 mmol/L _0 -  ACIDBASEDEF 0.0 - 2.0 mmol/L - 1.0 2.0 1.0 -   O2SAT % - 97.0 97.0 99.0 -      Capillary Blood Glucose: Lab Results  Component Value Date   GLUCAP 86 03/24/2011   GLUCAP 123 (H) 03/23/2011   GLUCAP 114 (H) 03/23/2011   GLUCAP 111 (H) 03/23/2011   GLUCAP 88 03/23/2011     ADL UCSD:     Pulmonary Assessment Scores    Row Name 11/12/15 1442         ADL UCSD   ADL Phase Entry     SOB Score total 61        Pulmonary Function Assessment:   Exercise Target Goals:    Exercise Program Goal: Individual exercise prescription set with THRR, safety & activity barriers. Participant demonstrates ability to understand and report RPE using BORG scale, to self-measure pulse accurately, and to acknowledge the importance of the exercise prescription.  Exercise  Prescription Goal: Starting with aerobic activity 30 plus minutes a day, 3 days per week for initial exercise prescription. Provide home exercise prescription and guidelines that participant acknowledges understanding prior to discharge.  Activity Barriers & Risk Stratification:     Activity Barriers & Cardiac Risk Stratification - 11/11/15 1252      Activity Barriers & Cardiac Risk Stratification   Activity Barriers Shortness of Breath;Deconditioning  has knee pain with peddling a bicycle      6 Minute Walk:     6 Minute Walk    Row Name 11/12/15 1623         6 Minute Walk   Phase Initial     Distance 1005 feet     Walk Time 6 minutes     # of Rest Breaks 0     MPH 1.9     METS 2.45     RPE 11     Perceived Dyspnea  1     Symptoms No     Resting HR 72 bpm     Resting BP 96/60     Max Ex. HR 68 bpm     Max Ex. BP 120/50       Interval HR   Baseline HR 72     1 Minute HR 64     2 Minute HR 67     3 Minute HR 67     4 Minute HR 68     5 Minute HR 68     6 Minute HR 68     2 Minute Post HR 60     Interval Heart Rate? Yes       Interval Oxygen   Interval Oxygen? Yes     Baseline Oxygen Saturation % 95 %     Baseline Liters of Oxygen 3 L     1 Minute Oxygen Saturation % 96 %     1 Minute Liters of Oxygen 3 L     2 Minute Oxygen Saturation % 97 %     2 Minute Liters of Oxygen 3 L     3 Minute Oxygen Saturation % 94 %     3 Minute Liters of Oxygen 3 L     4 Minute Oxygen Saturation % 96 %     4 Minute Liters of Oxygen 3 L     5 Minute Oxygen Saturation % 96 %     5 Minute Liters of Oxygen 3 L     6 Minute Oxygen Saturation % 96 %  6 Minute Liters of Oxygen 3 L     2 Minute Post Oxygen Saturation % 97 %     2 Minute Post Liters of Oxygen 3 L        Initial Exercise Prescription:     Initial Exercise Prescription - 11/12/15 1600      Date of Initial Exercise RX and Referring Provider   Date 11/12/15   Referring Provider Dr. Lamonte Sakai     Oxygen    Oxygen Continuous   Liters 3     NuStep   Level 1   Minutes 15   METs 1.3     Arm/Foot Ergometer   Level 1   Minutes 15     Track   Laps 5   Minutes 15     Prescription Details   Frequency (times per week) 2   Duration Progress to 45 minutes of aerobic exercise without signs/symptoms of physical distress     Intensity   THRR 40-80% of Max Heartrate 55-110   Ratings of Perceived Exertion 11-13   Perceived Dyspnea 0-4     Progression   Progression Continue progressive overload as per policy without signs/symptoms or physical distress.     Resistance Training   Training Prescription Yes   Weight Orange Bands   Reps 10-12      Perform Capillary Blood Glucose checks as needed.  Exercise Prescription Changes:     Exercise Prescription Changes    Row Name 11/21/15 1600 11/26/15 1600 12/03/15 1500 12/05/15 1600 12/10/15 1550     Exercise Review   Progression  -  - Yes Yes Yes     Response to Exercise   Blood Pressure (Admit) 116/56 94/50 1_0   Blood Pressure (Exercise) 108/56 96/50 130/56 124/72 110/70   Blood Pressure (Exit) 104/70 90/48 110/40 102/60 106/60   Heart Rate (Admit) 61 bpm 67 bpm 66 bpm 60 bpm 57 bpm   Heart Rate (Exercise) 59 bpm 69 bpm 61 bpm 63 bpm 64 bpm   Heart Rate (Exit) 58 bpm 58 bpm 57 bpm 53 bpm 60 bpm   Oxygen Saturation (Admit) 97 % 90 % 94 % 97 % 97 %   Oxygen Saturation (Exercise) 96 % 89 %  chest felt tight, used rescue inhaler, increased O2 to 3 L 94 % 96 % 98 %   Oxygen Saturation (Exit) 94 % 99 % 97 % 97 % 97 %   Rating of Perceived Exertion (Exercise) _1 Perceived Dyspnea (Exercise) _2 Duration Progress to 45 minutes of aerobic exercise without signs/symptoms of physical distress Progress to 45 minutes of aerobic exercise without signs/symptoms of physical distress Progress to 45 minutes of aerobic exercise without signs/symptoms of physical distress Progress to 45 minutes of aerobic exercise  without signs/symptoms of physical distress Progress to 45 minutes of aerobic exercise without signs/symptoms of physical distress   Intensity THRR New THRR New THRR New THRR unchanged THRR unchanged     Progression   Progression Continue to progress workloads to maintain intensity without signs/symptoms of physical distress. Continue to progress workloads to maintain intensity without signs/symptoms of physical distress. Continue to progress workloads to maintain intensity without signs/symptoms of physical distress. Continue to progress workloads to maintain intensity without signs/symptoms of physical distress. Continue to progress workloads to maintain intensity without signs/symptoms of physical distress.     Resistance Training   Training Prescription _3   Weight _0    Reps 10-12 10-12 10-12  10 minutes of strength training 10-12  10 minutes of strength training 10-12  10 minutes of strength training     Interval Training   Interval Training _1      Oxygen   Oxygen  - Continuous Continuous Continuous Continuous   Liters  - _2 NuStep   Level _3 Minutes _4 METs 1.8 2.2 2.3 2.1 2.1     Arm Ergometer   Level  - 1 1  - 2   Minutes  - 17 17  - 17     Track   Laps 11 3  used rescue inhaler  _5 Minutes _6 Row Name 12/12/15 1500 12/17/15 1500 12/19/15 1500 12/24/15 1500 12/26/15 1600     Exercise Review   Progression Yes  -  -  -  -     Response to Exercise   Blood Pressure (Admit) 102/56 92/40 104/60 104/64 94/60   Blood Pressure (Exercise) 108/66 100/50 170/60  - 120/60   Blood Pressure (Exit) 110/60 112/70 110/80 92/50 104/50   Heart Rate (Admit) 59 bpm 60 bpm 55 bpm 61 bpm 58 bpm   Heart Rate (Exercise) 71 bpm 66 bpm 65 bpm 72 bpm 70 bpm   Heart Rate (Exit) 56 bpm 60 bpm 61 bpm 65 bpm 58 bpm   Oxygen Saturation (Admit) 97 % 96 %  96 % 97 % 97 %   Oxygen Saturation (Exercise) 95 % 94 % 96 % 91 % 94 %   Oxygen Saturation (Exit) 98 % 96 % 97 % 97 % 98 %   Rating of Perceived Exertion (Exercise) _7 Perceived Dyspnea (Exercise) _8 Duration Progress to 45 minutes of aerobic exercise without signs/symptoms of physical distress Progress to 45 minutes of aerobic exercise without signs/symptoms of physical distress Progress to 45 minutes of aerobic exercise without signs/symptoms of physical distress Progress to 45 minutes of aerobic exercise without signs/symptoms of physical distress Progress to 45 minutes of aerobic exercise without signs/symptoms of physical distress   Intensity _9      Progression   Progression Continue to progress workloads to maintain intensity without signs/symptoms of physical distress. Continue to progress workloads to maintain intensity without signs/symptoms of physical distress. Continue to progress workloads to maintain intensity without signs/symptoms of physical distress. Continue to progress workloads to maintain intensity without signs/symptoms of physical distress. Continue to progress workloads to maintain intensity without signs/symptoms of physical distress.     Resistance Training   Training Prescription _10    Weight _11    Reps 10-12  10 minutes of strength training 10-12  10 minutes of strength training 10-12  10 minutes of strenght training 10-12  10 minutes of strength training 10-12  10 minutes of strength training     Interval Training   Interval Training _12      Oxygen   Oxygen _13    Liters _14 NuStep   Level 4 4  - 4 4  Minutes 17 17  - 17 17   METs 2.8 2.4  - 2.5 2.4     Arm Ergometer   Level _0 -   Minutes _1 -      Track   Laps  - _2 Minutes  - _3 Home Exercise Plan   Plans to continue exercise at  - Home  -  -  -   Frequency  - Add 3 additional days to program exercise sessions.  -  -  -   Row Name 12/31/15 1500 01/02/16 1500 01/09/16 1600 01/14/16 1500 01/16/16 1500     Exercise Review   Progression  - Yes  -  -  -     Response to Exercise   Blood Pressure (Admit) 100/52 142/60 104/54 118/64 98/54   Blood Pressure (Exercise) 132/52 130/64 132/64 104/64 136/60   Blood Pressure (Exit) 110/62 120/60 112/68 110/60 100/60   Heart Rate (Admit) 62 bpm 63 bpm 55 bpm 59 bpm 58 bpm   Heart Rate (Exercise) 72 bpm 73 bpm 60 bpm 68 bpm 67 bpm   Heart Rate (Exit) 59 bpm 60 bpm 52 bpm 59 bpm 59 bpm   Oxygen Saturation (Admit) 96 % 95 % 96 % 97 % 97 %   Oxygen Saturation (Exercise) 94 % 95 % 90 % 92 % 97 %   Oxygen Saturation (Exit) 97 % 97 % 98 % 97 % 98 %   Rating of Perceived Exertion (Exercise) _4 Perceived Dyspnea (Exercise) _5 Duration Progress to 45 minutes of aerobic exercise without signs/symptoms of physical distress Progress to 45 minutes of aerobic exercise without signs/symptoms of physical distress Progress to 45 minutes of aerobic exercise without signs/symptoms of physical distress Progress to 45 minutes of aerobic exercise without signs/symptoms of physical distress Progress to 45 minutes of aerobic exercise without signs/symptoms of physical distress   Intensity _6      Progression   Progression Continue to progress workloads to maintain intensity without signs/symptoms of physical distress. Continue to progress workloads to maintain intensity without signs/symptoms of physical distress. Continue to progress workloads to maintain intensity without signs/symptoms of physical distress. Continue to progress workloads to maintain intensity without signs/symptoms of physical distress.  Continue to progress workloads to maintain intensity without signs/symptoms of physical distress.     Resistance Training   Training Prescription _7    Weight _8    Reps 10-12  10 minutes of strength training 10-12  10 minutes of strength training 10-12  10 minutes of strength training 10-12  10 minutes of strength training 10-12  10 minutes of strength training     Interval Training   Interval Training _9      Oxygen   Oxygen _10    Liters _11 NuStep   Level 4  -  - 4 4   Minutes 17  -  - 17 17   METs 2.4  -  - 2.4 2.6     Arm Ergometer   Level  -  -  -  - 4   Minutes  -  -  -  - 17  Arm/Foot Ergometer   Level _0 -   Minutes _1 -     Track   Laps _2 -   Minutes _3 -   Row Name 01/21/16 1600 01/23/16 1600 01/28/16 1547         Exercise Review   Progression  -  - Yes       Response to Exercise   Blood Pressure (Admit) 118/64 100/52 102/54     Blood Pressure (Exercise) 148/70 150/78 106/54     Blood Pressure (Exit) 1_4     Heart Rate (Admit) 59 bpm 62 bpm 64 bpm     Heart Rate (Exercise) 67 bpm 69 bpm 66 bpm     Heart Rate (Exit) 54 bpm 61 bpm 61 bpm     Oxygen Saturation (Admit) 97 % 97 % 96 %     Oxygen Saturation (Exercise) 96 % 98 % 96 %     Oxygen Saturation (Exit) 98 % 98 % 94 %     Rating of Perceived Exertion (Exercise) _5 Perceived Dyspnea (Exercise) _6 Duration Progress to 45 minutes of aerobic exercise without signs/symptoms of physical distress Progress to 45 minutes of aerobic exercise without signs/symptoms of physical distress Progress to 45 minutes of aerobic exercise without signs/symptoms of physical distress     Intensity THRR unchanged THRR unchanged THRR unchanged       Progression   Progression Continue to progress workloads  to maintain intensity without signs/symptoms of physical distress. Continue to progress workloads to maintain intensity without signs/symptoms of physical distress. Continue to progress workloads to maintain intensity without signs/symptoms of physical distress.       Resistance Training   Training Prescription Yes Yes Yes     Weight orange bands orange bands orange bands     Reps 10-12  10 minutes of strength training 10-12  10 minutes of strength training 10-12  10 minutes of strength training       Interval Training   Interval Training No No No       Oxygen   Oxygen Continuous Continuous Continuous     Liters _7 NuStep   Level _8 Minutes _9 METs 2.8 2.6 2.6       Arm Ergometer   Level 4  - 5     Minutes 17  - 17       Track   Laps _10 Minutes _11 Exercise Comments:     Exercise Comments    Row Name 11/28/15 7829 12/17/15 1540 12/26/15 0819 01/27/16 1354     Exercise Comments Just started program, has only exercised 2 sessions, too eary to see any progress, tolerating exercise well. Completed home exercise prescription today Patient is cont. to increase exercise intensity and improve stamina Patient is cont. to progress in exercise intensity. Will cont. to monitor.        Discharge Exercise Prescription (Final Exercise Prescription Changes):     Exercise Prescription Changes - 01/28/16 1547      Exercise Review   Progression Yes     Response to Exercise   Blood Pressure (Admit) 102/54   Blood Pressure (Exercise)  106/54   Blood Pressure (Exit) 90/40   Heart Rate (Admit) 64 bpm   Heart Rate (Exercise) 66 bpm   Heart Rate (Exit) 61 bpm   Oxygen Saturation (Admit) 96 %   Oxygen Saturation (Exercise) 96 %   Oxygen Saturation (Exit) 94 %   Rating of Perceived Exertion (Exercise) 15   Perceived Dyspnea (Exercise) 1   Duration Progress to 45 minutes of aerobic exercise without signs/symptoms of physical distress    Intensity THRR unchanged     Progression   Progression Continue to progress workloads to maintain intensity without signs/symptoms of physical distress.     Resistance Training   Training Prescription Yes   Weight orange bands   Reps 10-12  10 minutes of strength training     Interval Training   Interval Training No     Oxygen   Oxygen Continuous   Liters 2     NuStep   Level 4   Minutes 17   METs 2.6     Arm Ergometer   Level 5   Minutes 17     Track   Laps 10   Minutes 17       Nutrition:  Target Goals: Understanding of nutrition guidelines, daily intake of sodium <1543m, cholesterol <2071m calories 30% from fat and 7% or less from saturated fats, daily to have 5 or more servings of fruits and vegetables.  Biometrics:     Pre Biometrics - 11/11/15 1256      Pre Biometrics   Grip Strength 24 kg       Nutrition Therapy Plan and Nutrition Goals:     Nutrition Therapy & Goals - 12/19/15 1504      Nutrition Therapy   Diet General, Healthful     Personal Nutrition Goals   Personal Goal #1 Maintain current wt or gain wt to UBW range of 122-129 lb     Intervention Plan   Intervention Prescribe, educate and counsel regarding individualized specific dietary modifications aiming towards targeted core components such as weight, hypertension, lipid management, diabetes, heart failure and other comorbidities.   Expected Outcomes Short Term Goal: Understand basic principles of dietary content, such as calories, fat, sodium, cholesterol and nutrients.;Long Term Goal: Adherence to prescribed nutrition plan.      Nutrition Discharge: Rate Your Plate Scores:     Nutrition Assessments - 12/19/15 1504      Rate Your Plate Scores   Pre Score 50      Psychosocial: Target Goals: Acknowledge presence or absence of depression, maximize coping skills, provide positive support system. Participant is able to verbalize types and ability to use techniques and skills  needed for reducing stress and depression.  Initial Review & Psychosocial Screening:     Initial Psych Review & Screening - 11/11/15 1302      Initial Review   Current issues with --  none identified     Family Dynamics   Good Support System? Yes     Barriers   Psychosocial barriers to participate in program There are no identifiable barriers or psychosocial needs.     Screening Interventions   Interventions Encouraged to exercise      Quality of Life Scores:     Quality of Life - 11/12/15 1443      Quality of Life Scores   Health/Function Pre 12.32 %   Socioeconomic Pre 22.5 %   Psych/Spiritual Pre 19.92 %   Family Pre 2 %   GLOBAL Pre 15.18 %  PHQ-9: Recent Review Flowsheet Data    Depression screen [facility] 2/9 11/13/2015 11/11/2015 06/21/2015 06/08/2015 08/10/2011   Decreased Interest 0 0 0 0 0   Down, Depressed, Hopeless 0 0 0 0 0   PHQ - 2 Score 0 0 0 0 0      Psychosocial Evaluation and Intervention:     Psychosocial Evaluation - 11/11/15 1303      Psychosocial Evaluation & Interventions   Interventions Encouraged to exercise with the program and follow exercise prescription   Comments none identified   Continued Psychosocial Services Needed No      Psychosocial Re-Evaluation:     Psychosocial Re-Evaluation    [place] Name 11/25/15 1133 12/24/15 0841 01/23/16 0803         Psychosocial Re-Evaluation   Interventions Encouraged to attend Pulmonary Rehabilitation for the exercise Encouraged to attend Pulmonary Rehabilitation for the exercise Encouraged to attend Pulmonary Rehabilitation for the exercise     Comments No psychosocial concerns identified at this time. -  no psychosocial concerns identified no psychosocial issues identified at this time     Continued Psychosocial Services Needed No No No       Education: Education Goals: Education classes will be provided on a weekly basis, covering required topics. Participant will state  understanding/return demonstration of topics presented.  Learning Barriers/Preferences:     Learning Barriers/Preferences - 11/11/15 1254      Learning Barriers/Preferences   Learning Barriers None   Learning Preferences Written Material;Group Instruction;Skilled Demonstration      Education Topics: Risk Factor Reduction:  -Group instruction that is supported by a PowerPoint presentation. Instructor discusses the definition of a risk factor, different risk factors for pulmonary disease, and how the heart and lungs work together.     Nutrition for Pulmonary Patient:  -Group instruction provided by PowerPoint slides, verbal discussion, and written materials to support subject matter. The instructor gives an explanation and review of healthy diet recommendations, which includes a discussion on weight management, recommendations for fruit and vegetable consumption, as well as protein, fluid, caffeine, fiber, sodium, sugar, and alcohol. Tips for eating when patients are short of breath are discussed. Flowsheet Row PULMONARY REHAB OTHER RESPIRATORY from 01/23/2016 in [place]  Date  12/26/15  Educator  RD  Instruction Review Code  2- meets goals/outcomes      Pursed Lip Breathing:  -Group instruction that is supported by demonstration and informational handouts. Instructor discusses the benefits of pursed lip and diaphragmatic breathing and detailed demonstration on how to preform both.   Flowsheet Row PULMONARY REHAB OTHER RESPIRATORY from 01/23/2016 in [place]  Date  01/23/16  Educator  RT  Instruction Review Code  2- meets goals/outcomes      Oxygen Safety:  -Group instruction provided by PowerPoint, verbal discussion, and written material to support subject matter. There is an overview of "What is Oxygen" and "Why do we need it".  Instructor also reviews how to create a safe environment for oxygen use, the importance  of using oxygen as prescribed, and the risks of noncompliance. There is a brief discussion on traveling with oxygen and resources the patient may utilize. Flowsheet Row PULMONARY REHAB OTHER RESPIRATORY from 01/23/2016 in [place]  Date  01/09/16  Educator  rn  Instruction Review Code  2- meets goals/outcomes      Oxygen Equipment:  -Group instruction provided by [facility] Staff utilizing handouts, written materials, and equipment  demonstrations.   Signs and Symptoms:  -Group instruction provided by written material and verbal discussion to support subject matter. Warning signs and symptoms of infection, stroke, and heart attack are reviewed and when to call the physician/911 reinforced. Tips for preventing the spread of infection discussed.   Advanced Directives:  -Group instruction provided by verbal instruction and written material to support subject matter. Instructor reviews Advanced Directive laws and proper instruction for filling out document.   Pulmonary Video:  -Group video education that reviews the importance of medication and oxygen compliance, exercise, good nutrition, pulmonary hygiene, and pursed lip and diaphragmatic breathing for the pulmonary patient. Flowsheet Row PULMONARY REHAB OTHER RESPIRATORY from 01/23/2016 in [place]  Date  01/16/16  Instruction Review Code  2- meets goals/outcomes      Exercise for the Pulmonary Patient:  -Group instruction that is supported by a PowerPoint presentation. Instructor discusses benefits of exercise, core components of exercise, frequency, duration, and intensity of an exercise routine, importance of utilizing pulse oximetry during exercise, safety while exercising, and options of places to exercise outside of rehab.   Flowsheet Row PULMONARY REHAB OTHER RESPIRATORY from 01/23/2016 in [place]  Date  11/21/15  Educator  EP   Instruction Review Code  2- meets goals/outcomes      Pulmonary Medications:  -Verbally interactive group education provided by instructor with focus on inhaled medications and proper administration.   Anatomy and Physiology of the Respiratory System and Intimacy:  -Group instruction provided by PowerPoint, verbal discussion, and written material to support subject matter. Instructor reviews respiratory cycle and anatomical components of the respiratory system and their functions. Instructor also reviews differences in obstructive and restrictive respiratory diseases with examples of each. Intimacy, Sex, and Sexuality differences are reviewed with a discussion on how relationships can change when diagnosed with pulmonary disease. Common sexual concerns are reviewed. Flowsheet Row PULMONARY REHAB OTHER RESPIRATORY from 01/23/2016 in [place]  Date  12/05/15  Educator  RN  Instruction Review Code  2- meets goals/outcomes      Knowledge Questionnaire Score:     Knowledge Questionnaire Score - 11/12/15 1442      Knowledge Questionnaire Score   Pre Score 6/13      Core Components/Risk Factors/Patient Goals at Admission:     Personal Goals and Risk Factors at Admission - 11/11/15 1258      Core Components/Risk Factors/Patient Goals on Admission   Increase Strength and Stamina Yes   Intervention Provide advice, education, support and counseling about physical activity/exercise needs.;Develop an individualized exercise prescription for aerobic and resistive training based on initial evaluation findings, risk stratification, comorbidities and participant's personal goals.   Expected Outcomes Achievement of increased cardiorespiratory fitness and enhanced flexibility, muscular endurance and strength shown through measurements of functional capacity and personal statement of participant.   Improve shortness of breath with ADL's Yes   Intervention Provide  education, individualized exercise plan and daily activity instruction to help decrease symptoms of SOB with activities of daily living.   Expected Outcomes Short Term: Achieves a reduction of symptoms when performing activities of daily living.      Core Components/Risk Factors/Patient Goals Review:      Goals and Risk Factor Review    Row Name 11/11/15 1300 11/25/15 1131 12/24/15 0839 01/23/16 0800       Core Components/Risk Factors/Patient Goals Review   Personal Goals Review Increase Strength and Stamina;Improve shortness of breath  with ADL's Increase Strength and Stamina;Improve shortness of breath with ADL's  - Increase Strength and Stamina;Improve shortness of breath with ADL's    Review increase strength, stamina with exercise in program Has attended less than 5 exercise sessions, too soon to see improvement. Is making progress, nustep up to level 4, arm ergometer up to level 3, walking 9-15 laps on track Continues to progress, arm ergometer level increased to 4, walking track laps have not increased, nustep increased to level 4.    Expected Outcomes Improved QOL and ability to perform ADL's with more ease Should see an improvement in the next 30 days in strength and stamina Continue to see increase in strength and stamina with workload increase Continue to increase workloads as tolerated       Core Components/Risk Factors/Patient Goals at Discharge (Final Review):      Goals and Risk Factor Review - 01/23/16 0800      Core Components/Risk Factors/Patient Goals Review   Personal Goals Review Increase Strength and Stamina;Improve shortness of breath with ADL's   Review Continues to progress, arm ergometer level increased to 4, walking track laps have not increased, nustep increased to level 4.   Expected Outcomes Continue to increase workloads as tolerated      ITP Comments:   Comments:ITP REVIEW Pt is making expected progress toward pulmonary rehab goals after completing 18  sessions. Recommend continued exercise, life style modification, education, and utilization of breathing techniques to increase stamina and strength and decrease shortness of breath with exertion.  **

## 2016-01-30 NOTE — Progress Notes (Signed)
Daily Session Note  Patient Details  Name: [name] MRN: [id number] Date of Birth: 03-02-1930 Referring Provider:   April Manson Pulmonary Rehab Walk Test from 11/12/2015 in [place]  Referring Provider  Dr. Lamonte Sakai      Encounter Date: 01/30/2016  Check In:     Session Check In - 01/30/16 1329      Check-In   Location [facility]   Staff Present Su Hilt, MS, ACSM RCEP, Exercise Physiologist;Joan Leonia Reeves, RN, BSN;Lisa Hughes, RN;Nolon Yellin Rollene Rotunda, RN, BSN   Supervising physician immediately available to respond to emergencies [facility] immediately available   Physician(s) Dr. Waldron Labs   Medication changes reported     No   Fall or balance concerns reported    No   Warm-up and Cool-down Performed as group-led Location manager Performed Yes   VAD Patient? No     Pain Assessment   Currently in Pain? No/denies   Multiple Pain Sites No      Capillary Blood Glucose: No results found for this or any previous visit (from the past 24 hour(s)).      Exercise Prescription Changes - 01/30/16 1519      Response to Exercise   Blood Pressure (Admit) 100/56   Blood Pressure (Exercise) 134/70   Blood Pressure (Exit) 104/66   Heart Rate (Admit) 62 bpm   Heart Rate (Exercise) 70 bpm   Heart Rate (Exit) 61 bpm   Oxygen Saturation (Admit) 97 %   Oxygen Saturation (Exercise) 93 %   Oxygen Saturation (Exit) 94 %   Rating of Perceived Exertion (Exercise) 15   Perceived Dyspnea (Exercise) 1   Duration Progress to 45 minutes of aerobic exercise without signs/symptoms of physical distress   Intensity THRR unchanged     Progression   Progression Continue to progress workloads to maintain intensity without signs/symptoms of physical distress.     Resistance Training   Training Prescription Yes   Weight orange bands   Reps 10-12  10 minutes of strength training     Interval Training   Interval  Training No     Oxygen   Oxygen Continuous   Liters 2     NuStep   Level 4   Minutes 17   METs 2.9     Arm Ergometer   Level 5   Minutes 17     Track   Laps 10   Minutes 17     Goals Met:  Independence with exercise equipment Improved SOB with ADL's Using PLB without cueing & demonstrates good technique Exercise tolerated well No report of cardiac concerns or symptoms Strength training completed today  Goals Unmet:  Not Applicable  Comments: Service time is from 1330 to 1500   Dr. Rush Farmer is Medical Director for Pulmonary Rehab at [facility].

## 2016-02-04 ENCOUNTER — Encounter (HOSPITAL_COMMUNITY)
Admission: RE | Admit: 2016-02-04 | Discharge: 2016-02-04 | Disposition: A | Discharge status: Home / Self Care | Payer: Medicare Other | Source: Ambulatory Visit | Attending: Emergency Medicine | Admitting: Emergency Medicine

## 2016-02-04 VITALS — Wt 115.1 lb

## 2016-02-04 DIAGNOSIS — J438 Other emphysema: Secondary | ICD-10-CM

## 2016-02-04 NOTE — Progress Notes (Signed)
Daily Session Note  Patient Details  Name: [name] MRN: [id number] Date of Birth: 08/27/1929 Referring Provider:   April Manson Pulmonary Rehab Walk Test from 11/12/2015 in [place]  Referring Provider  Dr. Lamonte Sakai      Encounter Date: 02/04/2016  Check In:     Session Check In - 02/04/16 1353      Check-In   Location [facility]   Staff Present Rosebud Poles, RN, BSN;Espiridion Supinski Ysidro Evert, RN;Molly diVincenzo, MS, ACSM RCEP, Exercise Physiologist;Portia Rollene Rotunda, RN, BSN   Supervising physician immediately available to respond to emergencies [facility] immediately available   Physician(s) Dr. Waldron Labs   Medication changes reported     No   Fall or balance concerns reported    No   Warm-up and Cool-down Performed as group-led instruction   Resistance Training Performed Yes   VAD Patient? No     Pain Assessment   Currently in Pain? No/denies   Multiple Pain Sites No      Capillary Blood Glucose: No results found for this or any previous visit (from the past 24 hour(s)).      Exercise Prescription Changes - 02/04/16 1500      Response to Exercise   Blood Pressure (Admit) 110/56   Blood Pressure (Exercise) 122/70   Blood Pressure (Exit) 124/72   Heart Rate (Admit) 67 bpm   Heart Rate (Exercise) 76 bpm   Heart Rate (Exit) 60 bpm   Oxygen Saturation (Admit) 97 %   Oxygen Saturation (Exercise) 95 %   Oxygen Saturation (Exit) 95 %   Rating of Perceived Exertion (Exercise) 13   Perceived Dyspnea (Exercise) 1   Duration Progress to 45 minutes of aerobic exercise without signs/symptoms of physical distress   Intensity THRR unchanged     Progression   Progression Continue to progress workloads to maintain intensity without signs/symptoms of physical distress.     Resistance Training   Training Prescription Yes   Weight orange bands   Reps 10-12  10 minutes of strength training     Interval Training   Interval  Training No     Oxygen   Oxygen Continuous   Liters 2     NuStep   Level 4   Minutes 17   METs 2.6     Arm Ergometer   Level 5   Minutes 17     Track   Laps 9   Minutes 17     Goals Met:  Exercise tolerated well No report of cardiac concerns or symptoms Strength training completed today  Goals Unmet:  Not Applicable  Comments: Service time is from 1330 to 1500    Dr. Rush Farmer is Medical Director for Pulmonary Rehab at [facility].

## 2016-02-06 ENCOUNTER — Encounter (HOSPITAL_COMMUNITY)
Admission: RE | Admit: 2016-02-06 | Discharge: 2016-02-06 | Disposition: A | Discharge status: Home / Self Care | Payer: Medicare Other | Source: Ambulatory Visit | Attending: Emergency Medicine | Admitting: Emergency Medicine

## 2016-02-06 VITALS — Wt 116.4 lb

## 2016-02-06 DIAGNOSIS — J438 Other emphysema: Secondary | ICD-10-CM

## 2016-02-06 NOTE — Progress Notes (Signed)
Daily Session Note  Patient Details  Name: [name] MRN: [id number] Date of Birth: [birth year] Referring Provider:   April Manson Pulmonary Rehab Walk Test from 11/12/2015 in [place]  Referring Provider  Dr. Lamonte Sakai      Encounter Date: 02/06/2016  Check In:     Session Check In - 02/06/16 1330      Check-In   [facility]   Staff Present Rosebud Poles, RN, BSN;Wyman Meschke Ysidro Evert, RN;Molly diVincenzo, MS, ACSM RCEP, Exercise Physiologist;Annedrea Rosezella [place], RN, MHA;Portia Rollene Rotunda, RN, BSN   Supervising physician immediately available to respond to emergencies [facility] immediately available   Physician(s) Dr. Dyann Kief   Medication changes reported     No   Fall or balance concerns reported    No   Warm-up and Cool-down Performed as group-led instruction   Resistance Training Performed Yes   VAD Patient? No     Pain Assessment   Currently in Pain? No/denies   Multiple Pain Sites No      Capillary Blood Glucose: No results found for this or any previous visit (from the past 24 hour(s)).      Exercise Prescription Changes - 02/06/16 1500      Response to Exercise   Blood Pressure (Admit) 104/60   Blood Pressure (Exercise) 134/70   Blood Pressure (Exit) 126/74   Heart Rate (Admit) 58 bpm   Heart Rate (Exercise) 76 bpm   Heart Rate (Exit) 72 bpm   Oxygen Saturation (Admit) 97 %   Oxygen Saturation (Exercise) 98 %   Oxygen Saturation (Exit) 95 %   Rating of Perceived Exertion (Exercise) 13   Perceived Dyspnea (Exercise) 1   Duration Progress to 45 minutes of aerobic exercise without signs/symptoms of physical distress   Intensity THRR unchanged     Progression   Progression Continue to progress workloads to maintain intensity without signs/symptoms of physical distress.     Resistance Training   Training Prescription Yes   Weight orange bands   Reps 10-12  10 minutes of strength training     Interval  Training   Interval Training No     Oxygen   Oxygen Continuous   Liters 2     NuStep   Level 4   Minutes 17   METs 2.4     Arm Ergometer   Level 5   Minutes 17     Goals Met:  Exercise tolerated well No report of cardiac concerns or symptoms Strength training completed today  Goals Unmet:  Not Applicable  Comments: Service time is from 1330 to 1515    Dr. Rush Farmer is Medical Director for Pulmonary Rehab at [facility].

## 2016-02-11 ENCOUNTER — Encounter (HOSPITAL_COMMUNITY)
Admission: RE | Admit: 2016-02-11 | Discharge: 2016-02-11 | Disposition: A | Discharge status: Home / Self Care | Payer: Medicare Other | Source: Ambulatory Visit | Attending: Emergency Medicine | Admitting: Emergency Medicine

## 2016-02-11 VITALS — Wt 113.8 lb

## 2016-02-11 DIAGNOSIS — J438 Other emphysema: Secondary | ICD-10-CM

## 2016-02-11 NOTE — Progress Notes (Signed)
Daily Session Note  Patient Details  Name: GIABELLA DUHART MRN: [id number] Date of Birth: 04-09-1930 Referring Provider:   April Manson Pulmonary Rehab Walk Test from 11/12/2015 in [place]  Referring Provider  Dr. Lamonte Sakai      Encounter Date: 02/11/2016  Check In:     Session Check In - 02/11/16 1333      Check-In   [facility]   Staff Present Rosebud Poles, RN, BSN;Lisa Ysidro Evert, RN;Molly diVincenzo, MS, ACSM RCEP, Exercise Physiologist;Portia Rollene Rotunda, RN, BSN   Supervising physician immediately available to respond to emergencies [facility] immediately available   Physician(s) Dr. Dyann Kief   Medication changes reported     No   Fall or balance concerns reported    No   Warm-up and Cool-down Performed as group-led instruction   Resistance Training Performed Yes   VAD Patient? No     Pain Assessment   Currently in Pain? No/denies   Multiple Pain Sites No      Capillary Blood Glucose: No results found for this or any previous visit (from the past 24 hour(s)).      Exercise Prescription Changes - 02/11/16 1500      Response to Exercise   Blood Pressure (Admit) 100/48   Blood Pressure (Exercise) 120/70   Blood Pressure (Exit) 138/90   Heart Rate (Admit) 65 bpm   Heart Rate (Exercise) 70 bpm   Heart Rate (Exit) 72 bpm   Oxygen Saturation (Admit) 96 %   Oxygen Saturation (Exercise) 94 %   Oxygen Saturation (Exit) 95 %   Rating of Perceived Exertion (Exercise) 13   Perceived Dyspnea (Exercise) 1   Duration Progress to 45 minutes of aerobic exercise without signs/symptoms of physical distress   Intensity THRR unchanged     Progression   Progression Continue to progress workloads to maintain intensity without signs/symptoms of physical distress.     Resistance Training   Training Prescription Yes   Weight orange bands   Reps 10-12  10 minutes of strength training     Interval Training   Interval  Training No     Oxygen   Oxygen Continuous   Liters 2     NuStep   Level 4   Minutes 17   METs 2.4     Arm Ergometer   Level 5     Track   Laps 8   Minutes 17     Goals Met:  Exercise tolerated well Strength training completed today  Goals Unmet:  Not Applicable  Comments: Service time is from 1330 to 1500    Dr. Rush Farmer is Medical Director for Pulmonary Rehab at [facility].

## 2016-02-13 ENCOUNTER — Encounter (HOSPITAL_COMMUNITY)
Admission: RE | Admit: 2016-02-13 | Discharge: 2016-02-13 | Disposition: A | Discharge status: Home / Self Care | Payer: Medicare Other | Source: Ambulatory Visit | Attending: Emergency Medicine | Admitting: Emergency Medicine

## 2016-02-13 ENCOUNTER — Other Ambulatory Visit: Payer: Self-pay | Admitting: Family Medicine

## 2016-02-13 VITALS — Wt 117.3 lb

## 2016-02-13 DIAGNOSIS — J438 Other emphysema: Secondary | ICD-10-CM | POA: Diagnosis not present

## 2016-02-13 NOTE — Progress Notes (Signed)
Daily Session Note  Patient Details  Name: [name] MRN: [id number] Date of Birth: 03-21-1930 Referring Provider:   April Manson Pulmonary Rehab Walk Test from 11/12/2015 in [place]  Referring Provider  Dr. Lamonte Sakai      Encounter Date: 02/13/2016  Check In:     Session Check In - 02/13/16 1342      Check-In   [facility]   Staff Present Rosebud Poles, RN, BSN;Molly diVincenzo, MS, ACSM RCEP, Exercise Physiologist;Lisa Ysidro Evert, RN;Portia Rollene Rotunda, RN, BSN   Supervising physician immediately available to respond to emergencies [facility] immediately available   Physician(s) Dr. Cathlean Sauer   Medication changes reported     No   Fall or balance concerns reported    No   Warm-up and Cool-down Performed as group-led instruction   Resistance Training Performed Yes   VAD Patient? No     Pain Assessment   Currently in Pain? No/denies   Multiple Pain Sites No      Capillary Blood Glucose: No results found for this or any previous visit (from the past 24 hour(s)).      Exercise Prescription Changes - 02/13/16 1500      Response to Exercise   Blood Pressure (Admit) 110/60   Blood Pressure (Exercise) 140/80   Blood Pressure (Exit) 120/60   Heart Rate (Admit) 73 bpm   Heart Rate (Exercise) 94 bpm   Heart Rate (Exit) 69 bpm   Oxygen Saturation (Admit) 95 %   Oxygen Saturation (Exercise) 93 %   Oxygen Saturation (Exit) 96 %   Rating of Perceived Exertion (Exercise) 13   Perceived Dyspnea (Exercise) 2   Duration Progress to 45 minutes of aerobic exercise without signs/symptoms of physical distress   Intensity THRR unchanged     Progression   Progression Continue to progress workloads to maintain intensity without signs/symptoms of physical distress.     Resistance Training   Training Prescription Yes   Weight orange bands   Reps 10-12  10 minutes of strength training     Interval Training   Interval  Training No     Oxygen   Oxygen Continuous   Liters 2     Arm Ergometer   Level 5   Minutes 17     Track   Laps 8   Minutes 17     Goals Met:  Exercise tolerated well Strength training completed today  Goals Unmet:  Not Applicable  Comments: Service time is from 1330 to 1515    Dr. Rush Farmer is Medical Director for Pulmonary Rehab at [facility].

## 2016-02-14 ENCOUNTER — Ambulatory Visit (INDEPENDENT_AMBULATORY_CARE_PROVIDER_SITE_OTHER): Payer: Medicare Other | Admitting: Physician Assistant

## 2016-02-14 VITALS — BP 110/68 | HR 60 | Temp 98.3°F | Resp 18 | Ht 62.25 in | Wt 116.0 lb

## 2016-02-14 DIAGNOSIS — H65191 Other acute nonsuppurative otitis media, right ear: Secondary | ICD-10-CM | POA: Diagnosis not present

## 2016-02-14 MED ORDER — AZITHROMYCIN 250 MG PO TABS: ORAL_TABLET | ORAL | 0 refills | Status: DC

## 2016-02-14 NOTE — Patient Instructions (Signed)
     IF you received an x-ray today, you will receive an invoice from [facility]. Please contact  Radiology at [phone] with questions or concerns regarding your invoice.   IF you received labwork today, you will receive an invoice from [facility]. Please contact [facility] at [phone] with questions or concerns regarding your invoice.   Our billing staff will not be able to assist you with questions regarding bills from these companies.  You will be contacted with the lab results as soon as they are available. The fastest way to get your results is to activate your My Chart account. Instructions are located on the last page of this paperwork. If you have not heard from us regarding the results in 2 weeks, please contact this office.      

## 2016-02-14 NOTE — Progress Notes (Signed)
02/14/2016 4:04 PM   DOB: [birth year] / MRN: [id number]  SUBJECTIVE:  [name] is a 82 y.o. female presenting for early morning ear ache that started this week.  Reports the pain is constant and moderate in severity. She has tried tylenol and this has helped with the pain.  She deneis jaw pain and a change in hearing.  She has not been taking any new medications.  She has recently stopped amlodipine and another BP med which she does not know the name of.  She denies vision changes and scotoma.  She denies neck pain.        She is allergic to clarithromycin; tamsulosin; and penicillins.   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    She  reports that she quit smoking about 5 years ago. Her smoking use included Cigarettes. She has a 55.80 pack-year smoking history. She has never used smokeless tobacco. She reports that she does not drink alcohol or use drugs. She  reports that she does not engage in sexual activity. The patient  has a past surgical history that includes Breast lumpectomy; Foot surgery; Knee arthroscopy; Cholecystectomy; Cataract extraction; and Aortic root replacement (03/18/2011).  Her family history includes Asthma in her maternal aunt and maternal uncle; Breast cancer in her sister; Cancer in her mother; Emphysema in her maternal aunt; Heart disease in her maternal grandmother; Lung cancer in her father; Uterine cancer in her sister.  Review of Systems  Constitutional: Negative for chills and fever.  HENT: Positive for ear pain. Negative for ear discharge.   Respiratory: Negative  for cough.   Gastrointestinal: Negative for heartburn.  Musculoskeletal: Negative for myalgias.  Skin: Negative for rash.  Neurological: Negative for dizziness.  Psychiatric/Behavioral: Negative for depression.    The problem list and medications were reviewed and updated by myself where necessary and exist elsewhere in the encounter.   OBJECTIVE:  BP 110/68 (BP Location: Right Arm, Patient Position: Sitting, Cuff Size: Small)   Pulse 60   Temp 98.3 F (36.8 C) (Oral)   Resp 18   Ht 5' 2.25" (1.581 m)   Wt 116 lb (52.6 kg)   SpO2 94%   BMI 21.05 kg/m   Physical Exam  Constitutional: She is oriented to person, place, and time.  HENT:  Right Ear: External ear normal.  Left Ear: External ear normal.  Ears:  Nose: Mucosal edema present. Right sinus exhibits no maxillary sinus tenderness and no frontal sinus tenderness. Left sinus exhibits no maxillary sinus tenderness and no frontal sinus tenderness.  Mouth/Throat: Oropharynx is clear and moist. No oropharyngeal exudate.  Eyes: Conjunctivae are normal. Pupils are equal, round, and reactive to light.  Cardiovascular: Regular rhythm and normal heart sounds.   Pulmonary/Chest: Effort normal and breath sounds normal. She has no rales.  Neurological: She is alert and oriented to person, place, and time.  Skin: Skin is warm and dry. No rash noted. She is not diaphoretic. No erythema.  Psychiatric: Her behavior is normal.    Lab Results  Component Value Date   CREATININE 0.88 11/08/2015     No results found for this or any previous visit (from the past 72 hour(s)).  No results found.  ASSESSMENT  AND PLAN  Kelbie was seen today for headache.  Diagnoses and all orders for this visit:  Acute nonsuppurative otitis media of right ear -     azithromycin (ZITHROMAX) 250 MG tablet; Take one daily for 5 days.    The patient is advised to call or return to clinic if she does not see an improvement in symptoms, or to seek the  care of the closest emergency department if she worsens with the above plan.   Philis Fendt, MHS, PA-C [facility] 02/14/2016 4:04 PM

## 2016-02-18 ENCOUNTER — Encounter (HOSPITAL_COMMUNITY): Payer: Medicare Other

## 2016-02-20 ENCOUNTER — Encounter (HOSPITAL_COMMUNITY): Payer: Medicare Other

## 2016-02-25 ENCOUNTER — Encounter (HOSPITAL_COMMUNITY)
Admission: RE | Admit: 2016-02-25 | Discharge: 2016-02-25 | Disposition: A | Discharge status: Home / Self Care | Payer: Medicare Other | Source: Ambulatory Visit | Attending: Emergency Medicine | Admitting: Emergency Medicine

## 2016-02-25 ENCOUNTER — Telehealth (HOSPITAL_COMMUNITY): Payer: Self-pay | Admitting: *Deleted

## 2016-02-25 DIAGNOSIS — J438 Other emphysema: Secondary | ICD-10-CM | POA: Insufficient documentation

## 2016-02-25 NOTE — Progress Notes (Signed)
Pulmonary Individual Treatment Plan  Patient Details  Name: [name] MRN: [id number] Date of Birth: 10-21-1929 Referring Provider:   April Manson Pulmonary Rehab Walk Test from 11/12/2015 in [facility]  Referring Provider  Dr. Lamonte Sakai      Initial Encounter Date:  Flowsheet Row Pulmonary Rehab Walk Test from 11/12/2015 in [facility]  Date  11/12/15  Referring Provider  Dr. Lamonte Sakai      Visit Diagnosis: Other emphysema ([place])  Patient's Home Medications on Admission:   Current Outpatient Prescriptions:  .  albuterol (PROVENTIL HFA;VENTOLIN HFA) 108 (90 Base) MCG/ACT inhaler, Inhale 2 puffs into the lungs every 6 (six) hours as needed for wheezing or shortness of breath., Disp: 1 Inhaler, Rfl: 2 .  albuterol (PROVENTIL) (2.5 MG/3ML) 0.083% nebulizer solution, Take 3 mLs (2.5 mg total) by nebulization every 4 (four) hours as needed for wheezing or shortness of breath., Disp: 300 mL, Rfl: 5 .  aspirin 81 MG tablet, Take 81 mg by mouth daily., Disp: , Rfl:  .  azithromycin (ZITHROMAX) 250 MG tablet, Take one daily for 5 days., Disp: 6 tablet, Rfl: 0 .  DiphenhydrAMINE HCl (BENADRYL ALLERGY PO), Take 1 capsule by mouth at bedtime., Disp: , Rfl:  .  levothyroxine (SYNTHROID, LEVOTHROID) 125 MCG tablet, TAKE ONE TABLET BY MOUTH ONCE DAILY, Disp: 90 tablet, Rfl: 0 .  Melatonin 3 MG TABS, Take 2 tablets by mouth at bedtime., Disp: , Rfl:  .  Multiple Vitamins-Minerals (MULTIVITAMIN WITH MINERALS) tablet, Take 1 tablet by mouth daily., Disp: , Rfl:  .  propranolol ER (INDERAL LA) 60 MG 24 hr capsule, TAKE ONE CAPSULE BY MOUTH ONCE DAILY, Disp: 90 capsule, Rfl: 0 .  simvastatin (ZOCOR) 20 MG tablet, Take 20 mg by mouth at bedtime.  , Disp: , Rfl:  .  STIOLTO RESPIMAT 2.5-2.5 MCG/ACT AERS, INHALE TWO PUFFS INTO THE LUNGS ONCE DAILY, Disp: 1 Inhaler, Rfl: 5  Past Medical History: Past Medical History:  Diagnosis Date  . Allergic  rhinitis   . Aortic stenosis   . Carotid artery disease ([place])   . Colon polyps   . h/o Atrial fibrillation ([place]) 03/22/2011   Brief post op   . Hematuria 11/05/2015  . Hypercalcemia   . Hypertension   . Hypothyroid   . Osteopenia   . 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 Pressure urticaria   . RLS (restless legs syndrome)   . S/P aortic valve replacement and aortoplasty 04/13/2011   74m Medtronic Freestyle porcine aortic root   . S/P aortic valve replacement with stentless valve 03/18/2011    Tobacco Use: History  Smoking Status  . Former Smoker  . Packs/day: 0.90  . Years: 62.00  . Types: Cigarettes  . Quit date: 07/14/2010  Smokeless Tobacco  . Never Used    Labs: Recent Review Flowsheet Data    Labs for ITP Cardiac and Pulmonary Rehab Latest Ref Rng & Units 03/18/2011 03/19/2011 03/19/2011 03/19/2011 11/08/2015   Cholestrol 125 - 200 mg/dL - - - - 133   LDLCALC <130 mg/dL - - - - 53   HDL >=46 mg/dL - - - - 63   Trlycerides <150 mg/dL - - - - 84   Hemoglobin A1c <5.7 % - - - - 6.0(H)   PHART 7.350 - 7.400 - 7.316(L) 7.295(L) 7.359 -   PCO2ART 35.0 - 45.0 mmHg - 49.2(H) 50.8(H) 43.7 -   HCO3 20.0 - 24.0 mEq/L - 25.0(H) 24.6(H) 24.5(H) -  TCO2 0 - 100 mmol/L _0 -   ACIDBASEDEF 0.0 - 2.0 mmol/L - 1.0 2.0 1.0 -   O2SAT % - 97.0 97.0 99.0 -      Capillary Blood Glucose: Lab Results  Component Value Date   GLUCAP 86 03/24/2011   GLUCAP 123 (H) 03/23/2011   GLUCAP 114 (H) 03/23/2011   GLUCAP 111 (H) 03/23/2011   GLUCAP 88 03/23/2011     ADL UCSD:     Pulmonary Assessment Scores    Row Name 11/12/15 1442         ADL UCSD   ADL Phase Entry     SOB Score total 61        Pulmonary Function Assessment:   Exercise Target Goals:    Exercise Program Goal: Individual exercise prescription set with THRR, safety & activity barriers. Participant demonstrates ability to understand and report RPE using BORG scale, to self-measure pulse accurately, and to  acknowledge the importance of the exercise prescription.  Exercise Prescription Goal: Starting with aerobic activity 30 plus minutes a day, 3 days per week for initial exercise prescription. Provide home exercise prescription and guidelines that participant acknowledges understanding prior to discharge.  Activity Barriers & Risk Stratification:     Activity Barriers & Cardiac Risk Stratification - 11/11/15 1252      Activity Barriers & Cardiac Risk Stratification   Activity Barriers Shortness of Breath;Deconditioning  has knee pain with peddling a bicycle      6 Minute Walk:     6 Minute Walk    Row Name 11/12/15 1623         6 Minute Walk   Phase Initial     Distance 1005 feet     Walk Time 6 minutes     # of Rest Breaks 0     MPH 1.9     METS 2.45     RPE 11     Perceived Dyspnea  1     Symptoms No     Resting HR 72 bpm     Resting BP 96/60     Max Ex. HR 68 bpm     Max Ex. BP 120/50       Interval HR   Baseline HR 72     1 Minute HR 64     2 Minute HR 67     3 Minute HR 67     4 Minute HR 68     5 Minute HR 68     6 Minute HR 68     2 Minute Post HR 60     Interval Heart Rate? Yes       Interval Oxygen   Interval Oxygen? Yes     Baseline Oxygen Saturation % 95 %     Baseline Liters of Oxygen 3 L     1 Minute Oxygen Saturation % 96 %     1 Minute Liters of Oxygen 3 L     2 Minute Oxygen Saturation % 97 %     2 Minute Liters of Oxygen 3 L     3 Minute Oxygen Saturation % 94 %     3 Minute Liters of Oxygen 3 L     4 Minute Oxygen Saturation % 96 %     4 Minute Liters of Oxygen 3 L     5 Minute Oxygen Saturation % 96 %     5 Minute Liters of Oxygen 3 L  6 Minute Oxygen Saturation % 96 %     6 Minute Liters of Oxygen 3 L     2 Minute Post Oxygen Saturation % 97 %     2 Minute Post Liters of Oxygen 3 L        Initial Exercise Prescription:     Initial Exercise Prescription - 11/12/15 1600      Date of Initial Exercise RX and Referring  Provider   Date 11/12/15   Referring Provider Dr. Lamonte Sakai     Oxygen   Oxygen Continuous   Liters 3     NuStep   Level 1   Minutes 15   METs 1.3     Arm/Foot Ergometer   Level 1   Minutes 15     Track   Laps 5   Minutes 15     Prescription Details   Frequency (times per week) 2   Duration Progress to 45 minutes of aerobic exercise without signs/symptoms of physical distress     Intensity   THRR 40-80% of Max Heartrate 55-110   Ratings of Perceived Exertion 11-13   Perceived Dyspnea 0-4     Progression   Progression Continue progressive overload as per policy without signs/symptoms or physical distress.     Resistance Training   Training Prescription Yes   Weight Orange Bands   Reps 10-12      Perform Capillary Blood Glucose checks as needed.  Exercise Prescription Changes:     Exercise Prescription Changes    Row Name 11/21/15 1600 11/26/15 1600 12/03/15 1500 12/05/15 1600 12/10/15 1550     Exercise Review   Progression  -  - Yes Yes Yes     Response to Exercise   Blood Pressure (Admit) 116/56 94/50 100/60 98/50 92/50   Blood Pressure (Exercise) 108/56 96/50 130/56 124/72 110/70   Blood Pressure (Exit) 104/70 90/48 110/40 102/60 106/60   Heart Rate (Admit) 61 bpm 67 bpm 66 bpm 60 bpm 57 bpm   Heart Rate (Exercise) 59 bpm 69 bpm 61 bpm 63 bpm 64 bpm   Heart Rate (Exit) 58 bpm 58 bpm 57 bpm 53 bpm 60 bpm   Oxygen Saturation (Admit) 97 % 90 % 94 % 97 % 97 %   Oxygen Saturation (Exercise) 96 % 89 %  chest felt tight, used rescue inhaler, increased O2 to 3 L 94 % 96 % 98 %   Oxygen Saturation (Exit) 94 % 99 % 97 % 97 % 97 %   Rating of Perceived Exertion (Exercise) _0 Perceived Dyspnea (Exercise) _1 Duration Progress to 45 minutes of aerobic exercise without signs/symptoms of physical distress Progress to 45 minutes of aerobic exercise without signs/symptoms of physical distress Progress to 45 minutes of aerobic exercise without  signs/symptoms of physical distress Progress to 45 minutes of aerobic exercise without signs/symptoms of physical distress Progress to 45 minutes of aerobic exercise without signs/symptoms of physical distress   Intensity THRR New THRR New THRR New THRR unchanged THRR unchanged     Progression   Progression Continue to progress workloads to maintain intensity without signs/symptoms of physical distress. Continue to progress workloads to maintain intensity without signs/symptoms of physical distress. Continue to progress workloads to maintain intensity without signs/symptoms of physical distress. Continue to progress workloads to maintain intensity without signs/symptoms of physical distress. Continue to progress workloads to maintain intensity without signs/symptoms of physical distress.     Resistance  Training   Training Prescription _0    Weight _1    Reps 10-12 10-12 10-12  10 minutes of strength training 10-12  10 minutes of strength training 10-12  10 minutes of strength training     Interval Training   Interval Training _2      Oxygen   Oxygen  - Continuous Continuous Continuous Continuous   Liters  - _3 NuStep   Level _4 Minutes _5 METs 1.8 2.2 2.3 2.1 2.1     Arm Ergometer   Level  - 1 1  - 2   Minutes  - 17 17  - 17     Track   Laps 11 3  used rescue inhaler  _6 Minutes _7 Row Name 12/12/15 1500 12/17/15 1500 12/19/15 1500 12/24/15 1500 12/26/15 1600     Exercise Review   Progression Yes  -  -  -  -     Response to Exercise   Blood Pressure (Admit) 102/56 92/40 104/60 104/64 94/60   Blood Pressure (Exercise) 108/66 100/50 170/60  - 120/60   Blood Pressure (Exit) 110/60 112/70 110/80 92/50 104/50   Heart Rate (Admit) 59 bpm 60 bpm 55 bpm 61 bpm 58 bpm   Heart Rate (Exercise) 71 bpm 66 bpm 65 bpm 72 bpm 70 bpm   Heart Rate  (Exit) 56 bpm 60 bpm 61 bpm 65 bpm 58 bpm   Oxygen Saturation (Admit) 97 % 96 % 96 % 97 % 97 %   Oxygen Saturation (Exercise) 95 % 94 % 96 % 91 % 94 %   Oxygen Saturation (Exit) 98 % 96 % 97 % 97 % 98 %   Rating of Perceived Exertion (Exercise) _8 Perceived Dyspnea (Exercise) _9 Duration Progress to 45 minutes of aerobic exercise without signs/symptoms of physical distress Progress to 45 minutes of aerobic exercise without signs/symptoms of physical distress Progress to 45 minutes of aerobic exercise without signs/symptoms of physical distress Progress to 45 minutes of aerobic exercise without signs/symptoms of physical distress Progress to 45 minutes of aerobic exercise without signs/symptoms of physical distress   Intensity _10      Progression   Progression Continue to progress workloads to maintain intensity without signs/symptoms of physical distress. Continue to progress workloads to maintain intensity without signs/symptoms of physical distress. Continue to progress workloads to maintain intensity without signs/symptoms of physical distress. Continue to progress workloads to maintain intensity without signs/symptoms of physical distress. Continue to progress workloads to maintain intensity without signs/symptoms of physical distress.     Resistance Training   Training Prescription _11    Weight _12    Reps 10-12  10 minutes of strength training 10-12  10 minutes of strength training 10-12  10 minutes of strenght training 10-12  10 minutes of strength training 10-12  10 minutes of strength training     Interval Training   Interval Training _13      Oxygen   Oxygen _14    Liters _15 2  NuStep   Level 4 4  - 4 4   Minutes 17 17  - 17 17   METs 2.8  2.4  - 2.5 2.4     Arm Ergometer   Level _0 -   Minutes _1 -     Track   Laps  - _2 Minutes  - _3 Home Exercise Plan   Plans to continue exercise at  - Home  -  -  -   Frequency  - Add 3 additional days to program exercise sessions.  -  -  -   Row Name 12/31/15 1500 01/02/16 1500 01/09/16 1600 01/14/16 1500 01/16/16 1500     Exercise Review   Progression  - Yes  -  -  -     Response to Exercise   Blood Pressure (Admit) 100/52 142/60 104/54 118/64 98/54   Blood Pressure (Exercise) 132/52 130/64 132/64 104/64 136/60   Blood Pressure (Exit) 110/62 120/60 112/68 110/60 100/60   Heart Rate (Admit) 62 bpm 63 bpm 55 bpm 59 bpm 58 bpm   Heart Rate (Exercise) 72 bpm 73 bpm 60 bpm 68 bpm 67 bpm   Heart Rate (Exit) 59 bpm 60 bpm 52 bpm 59 bpm 59 bpm   Oxygen Saturation (Admit) 96 % 95 % 96 % 97 % 97 %   Oxygen Saturation (Exercise) 94 % 95 % 90 % 92 % 97 %   Oxygen Saturation (Exit) 97 % 97 % 98 % 97 % 98 %   Rating of Perceived Exertion (Exercise) _4 Perceived Dyspnea (Exercise) _5 Duration Progress to 45 minutes of aerobic exercise without signs/symptoms of physical distress Progress to 45 minutes of aerobic exercise without signs/symptoms of physical distress Progress to 45 minutes of aerobic exercise without signs/symptoms of physical distress Progress to 45 minutes of aerobic exercise without signs/symptoms of physical distress Progress to 45 minutes of aerobic exercise without signs/symptoms of physical distress   Intensity _6      Progression   Progression Continue to progress workloads to maintain intensity without signs/symptoms of physical distress. Continue to progress workloads to maintain intensity without signs/symptoms of physical distress. Continue to progress workloads to maintain intensity without signs/symptoms of physical distress. Continue to  progress workloads to maintain intensity without signs/symptoms of physical distress. Continue to progress workloads to maintain intensity without signs/symptoms of physical distress.     Resistance Training   Training Prescription _7    Weight _8    Reps 10-12  10 minutes of strength training 10-12  10 minutes of strength training 10-12  10 minutes of strength training 10-12  10 minutes of strength training 10-12  10 minutes of strength training     Interval Training   Interval Training _9      Oxygen   Oxygen _10    Liters _11 NuStep   Level 4  -  - 4 4   Minutes 17  -  - 17 17   METs 2.4  -  - 2.4 2.6     Arm Ergometer   Level  -  -  -  - 4  Minutes  -  -  -  - 17     Arm/Foot Ergometer   Level _0 -   Minutes _1 -     Track   Laps _2 -   Minutes _3 -   [employer] Name 01/21/16 1600 01/23/16 1600 01/28/16 1547 01/30/16 1519 02/04/16 1500     Exercise Review   Progression  -  - Yes  -  -     Response to Exercise   Blood Pressure (Admit) 118/64 100/52 102/54 100/56 110/56   Blood Pressure (Exercise) 148/70 150/78 106/54 134/70 122/70   Blood Pressure (Exit) 110/64 96/60 90/40 104/66 124/72   Heart Rate (Admit) 59 bpm 62 bpm 64 bpm 62 bpm 67 bpm   Heart Rate (Exercise) 67 bpm 69 bpm 66 bpm 70 bpm 76 bpm   Heart Rate (Exit) 54 bpm 61 bpm 61 bpm 61 bpm 60 bpm   Oxygen Saturation (Admit) 97 % 97 % 96 % 97 % 97 %   Oxygen Saturation (Exercise) 96 % 98 % 96 % 93 % 95 %   Oxygen Saturation (Exit) 98 % 98 % 94 % 94 % 95 %   Rating of Perceived Exertion (Exercise) _4 Perceived Dyspnea (Exercise) _5 Duration Progress to 45 minutes of aerobic exercise without signs/symptoms of physical distress Progress to 45 minutes of aerobic exercise without signs/symptoms of physical distress  Progress to 45 minutes of aerobic exercise without signs/symptoms of physical distress Progress to 45 minutes of aerobic exercise without signs/symptoms of physical distress Progress to 45 minutes of aerobic exercise without signs/symptoms of physical distress   Intensity _6      Progression   Progression Continue to progress workloads to maintain intensity without signs/symptoms of physical distress. Continue to progress workloads to maintain intensity without signs/symptoms of physical distress. Continue to progress workloads to maintain intensity without signs/symptoms of physical distress. Continue to progress workloads to maintain intensity without signs/symptoms of physical distress. Continue to progress workloads to maintain intensity without signs/symptoms of physical distress.     Resistance Training   Training Prescription _7    Weight _8    Reps 10-12  10 minutes of strength training 10-12  10 minutes of strength training 10-12  10 minutes of strength training 10-12  10 minutes of strength training 10-12  10 minutes of strength training     Interval Training   Interval Training _9      Oxygen   Oxygen _10    Liters _11 NuStep   Level _12 Minutes _13 METs 2.8 2.6 2.6 2.9 2.6     Arm Ergometer   Level 4  - _14 Minutes 17  - _15 Track   Laps _16 Minutes _17 Row Name 02/06/16 1500 02/11/16 1500 02/13/16 1500         Response to Exercise   Blood Pressure (Admit) 104/60 100/48 110/60     Blood Pressure (Exercise) 134/70 120/70 140/80  Blood Pressure (Exit) 126/74 138/90 120/60     Heart Rate (Admit) 58 bpm 65 bpm 73 bpm     Heart Rate (Exercise) 76 bpm 70 bpm 94 bpm     Heart Rate (Exit) 72  bpm 72 bpm 69 bpm     Oxygen Saturation (Admit) 97 % 96 % 95 %     Oxygen Saturation (Exercise) 98 % 94 % 93 %     Oxygen Saturation (Exit) 95 % 95 % 96 %     Rating of Perceived Exertion (Exercise) _0 Perceived Dyspnea (Exercise) _1 Duration Progress to 45 minutes of aerobic exercise without signs/symptoms of physical distress Progress to 45 minutes of aerobic exercise without signs/symptoms of physical distress Progress to 45 minutes of aerobic exercise without signs/symptoms of physical distress     Intensity THRR unchanged THRR unchanged THRR unchanged       Progression   Progression Continue to progress workloads to maintain intensity without signs/symptoms of physical distress. Continue to progress workloads to maintain intensity without signs/symptoms of physical distress. Continue to progress workloads to maintain intensity without signs/symptoms of physical distress.       Resistance Training   Training Prescription Yes Yes Yes     Weight orange bands orange bands orange bands     Reps 10-12  10 minutes of strength training 10-12  10 minutes of strength training 10-12  10 minutes of strength training       Interval Training   Interval Training No No No       Oxygen   Oxygen Continuous Continuous Continuous     Liters _2 NuStep   Level 4 4  -     Minutes 17 17  -     METs 2.4 2.4  -       Arm Ergometer   Level _3 Minutes 17  - 17       Track   Laps  - 8 8     Minutes  - 17 17        Exercise Comments:     Exercise Comments    Row Name 11/28/15 0093 12/17/15 1540 12/26/15 0819 01/27/16 1354     Exercise Comments Just started program, has only exercised 2 sessions, too eary to see any progress, tolerating exercise well. Completed home exercise prescription today Patient is cont. to increase exercise intensity and improve stamina Patient is cont. to progress in exercise intensity. Will cont. to monitor.        Discharge  Exercise Prescription (Final Exercise Prescription Changes):     Exercise Prescription Changes - 02/13/16 1500      Response to Exercise   Blood Pressure (Admit) 110/60   Blood Pressure (Exercise) 140/80   Blood Pressure (Exit) 120/60   Heart Rate (Admit) 73 bpm   Heart Rate (Exercise) 94 bpm   Heart Rate (Exit) 69 bpm   Oxygen Saturation (Admit) 95 %   Oxygen Saturation (Exercise) 93 %   Oxygen Saturation (Exit) 96 %   Rating of Perceived Exertion (Exercise) 13   Perceived Dyspnea (Exercise) 2   Duration Progress to 45 minutes of aerobic exercise without signs/symptoms of physical distress   Intensity THRR unchanged     Progression   Progression Continue to progress workloads to maintain intensity without signs/symptoms of physical distress.  Resistance Training   Training Prescription Yes   Weight orange bands   Reps 10-12  10 minutes of strength training     Interval Training   Interval Training No     Oxygen   Oxygen Continuous   Liters 2     Arm Ergometer   Level 5   Minutes 17     Track   Laps 8   Minutes 17       Nutrition:  Target Goals: Understanding of nutrition guidelines, daily intake of sodium <15109m, cholesterol <2074m calories 30% from fat and 7% or less from saturated fats, daily to have 5 or more servings of fruits and vegetables.  Biometrics:     Pre Biometrics - 11/11/15 1256      Pre Biometrics   Grip Strength 24 kg       Nutrition Therapy Plan and Nutrition Goals:     Nutrition Therapy & Goals - 12/19/15 1504      Nutrition Therapy   Diet General, Healthful     Personal Nutrition Goals   Personal Goal #1 Maintain current wt or gain wt to UBW range of 122-129 lb     Intervention Plan   Intervention Prescribe, educate and counsel regarding individualized specific dietary modifications aiming towards targeted core components such as weight, hypertension, lipid management, diabetes, heart failure and other comorbidities.    Expected Outcomes Short Term Goal: Understand basic principles of dietary content, such as calories, fat, sodium, cholesterol and nutrients.;Long Term Goal: Adherence to prescribed nutrition plan.      Nutrition Discharge: Rate Your Plate Scores:     Nutrition Assessments - 12/19/15 1504      Rate Your Plate Scores   Pre Score 50      Psychosocial: Target Goals: Acknowledge presence or absence of depression, maximize coping skills, provide positive support system. Participant is able to verbalize types and ability to use techniques and skills needed for reducing stress and depression.  Initial Review & Psychosocial Screening:     Initial Psych Review & Screening - 11/11/15 1302      Initial Review   Current issues with --  none identified     Family Dynamics   Good Support System? Yes     Barriers   Psychosocial barriers to participate in program There are no identifiable barriers or psychosocial needs.     Screening Interventions   Interventions Encouraged to exercise      Quality of Life Scores:     Quality of Life - 11/12/15 1443      Quality of Life Scores   Health/Function Pre 12.32 %   Socioeconomic Pre 22.5 %   Psych/Spiritual Pre 19.92 %   Family Pre 2 %   GLOBAL Pre 15.18 %      PHQ-9: Recent Review Flowsheet Data    [facility] 02/14/2016 11/13/2015 11/11/2015 06/21/2015 06/08/2015   Decreased Interest 0 0 0 0 0   Down, Depressed, Hopeless 0 0 0 0 0   PHQ - 2 Score 0 0 0 0 0      Psychosocial Evaluation and Intervention:     Psychosocial Evaluation - 11/11/15 1303      Psychosocial Evaluation & Interventions   Interventions Encouraged to exercise with the program and follow exercise prescription   Comments none identified   Continued Psychosocial Services Needed No      Psychosocial Re-Evaluation:     Psychosocial Re-Evaluation    [place] 11/25/15 1133 12/24/15 [id number]  01/23/16 0803 02/24/16 0854       Psychosocial  Re-Evaluation   Interventions Encouraged to attend Pulmonary Rehabilitation for the exercise Encouraged to attend Pulmonary Rehabilitation for the exercise Encouraged to attend Pulmonary Rehabilitation for the exercise Encouraged to attend Pulmonary Rehabilitation for the exercise    Comments No psychosocial concerns identified at this time. -  no psychosocial concerns identified no psychosocial issues identified at this time No psychosocial issues identified while in program.    Continued Psychosocial Services Needed No No No No      Education: Education Goals: Education classes will be provided on a weekly basis, covering required topics. Participant will state understanding/return demonstration of topics presented.  Learning Barriers/Preferences:     Learning Barriers/Preferences - 11/11/15 1254      Learning Barriers/Preferences   Learning Barriers None   Learning Preferences Written Material;Group Instruction;Skilled Demonstration      Education Topics: Risk Factor Reduction:  -Group instruction that is supported by a PowerPoint presentation. Instructor discusses the definition of a risk factor, different risk factors for pulmonary disease, and how the heart and lungs work together.     Nutrition for Pulmonary Patient:  -Group instruction provided by PowerPoint slides, verbal discussion, and written materials to support subject matter. The instructor gives an explanation and review of healthy diet recommendations, which includes a discussion on weight management, recommendations for fruit and vegetable consumption, as well as protein, fluid, caffeine, fiber, sodium, sugar, and alcohol. Tips for eating when patients are short of breath are discussed. Flowsheet Row PULMONARY REHAB OTHER RESPIRATORY from 02/13/2016 in [place]  Date  12/26/15  Educator  RD  Instruction Review Code  2- meets goals/outcomes      Pursed Lip Breathing:  -Group  instruction that is supported by demonstration and informational handouts. Instructor discusses the benefits of pursed lip and diaphragmatic breathing and detailed demonstration on how to preform both.   Flowsheet Row PULMONARY REHAB OTHER RESPIRATORY from 02/13/2016 in [place]  Date  01/23/16  Educator  RT  Instruction Review Code  2- meets goals/outcomes      Oxygen Safety:  -Group instruction provided by PowerPoint, verbal discussion, and written material to support subject matter. There is an overview of "What is Oxygen" and "Why do we need it".  Instructor also reviews how to create a safe environment for oxygen use, the importance of using oxygen as prescribed, and the risks of noncompliance. There is a brief discussion on traveling with oxygen and resources the patient may utilize. Flowsheet Row PULMONARY REHAB OTHER RESPIRATORY from 02/13/2016 in [place]  Date  01/09/16  Educator  rn  Instruction Review Code  2- meets goals/outcomes      Oxygen Equipment:  -Group instruction provided by [employer] Staff utilizing handouts, written materials, and equipment demonstrations.   Signs and Symptoms:  -Group instruction provided by written material and verbal discussion to support subject matter. Warning signs and symptoms of infection, stroke, and heart attack are reviewed and when to call the physician/911 reinforced. Tips for preventing the spread of infection discussed. Flowsheet Row PULMONARY REHAB OTHER RESPIRATORY from 02/13/2016 in [place]  Date  02/13/16  Educator  rn  Instruction Review Code  2- meets goals/outcomes      Advanced Directives:  -Group instruction provided by verbal instruction and written material to support subject matter. Instructor reviews Advanced Directive laws and proper instruction for  filling out document.   Pulmonary Video:  -Group video education  that reviews the importance of medication and oxygen compliance, exercise, good nutrition, pulmonary hygiene, and pursed lip and diaphragmatic breathing for the pulmonary patient. Flowsheet Row PULMONARY REHAB OTHER RESPIRATORY from 02/13/2016 in [place]  Date  01/16/16  Instruction Review Code  2- meets goals/outcomes      Exercise for the Pulmonary Patient:  -Group instruction that is supported by a PowerPoint presentation. Instructor discusses benefits of exercise, core components of exercise, frequency, duration, and intensity of an exercise routine, importance of utilizing pulse oximetry during exercise, safety while exercising, and options of places to exercise outside of rehab.   Flowsheet Row PULMONARY REHAB OTHER RESPIRATORY from 02/13/2016 in [place]  Date  02/06/16  Educator  EP  Instruction Review Code  2- meets goals/outcomes      Pulmonary Medications:  -Verbally interactive group education provided by instructor with focus on inhaled medications and proper administration.   Anatomy and Physiology of the Respiratory System and Intimacy:  -Group instruction provided by PowerPoint, verbal discussion, and written material to support subject matter. Instructor reviews respiratory cycle and anatomical components of the respiratory system and their functions. Instructor also reviews differences in obstructive and restrictive respiratory diseases with examples of each. Intimacy, Sex, and Sexuality differences are reviewed with a discussion on how relationships can change when diagnosed with pulmonary disease. Common sexual concerns are reviewed. Flowsheet Row PULMONARY REHAB OTHER RESPIRATORY from 02/13/2016 in [place]  Date  12/05/15  Educator  RN  Instruction Review Code  2- meets goals/outcomes      Knowledge Questionnaire Score:     Knowledge Questionnaire Score - 11/12/15  1442      Knowledge Questionnaire Score   Pre Score 6/13      Core Components/Risk Factors/Patient Goals at Admission:     Personal Goals and Risk Factors at Admission - 11/11/15 1258      Core Components/Risk Factors/Patient Goals on Admission   Increase Strength and Stamina Yes   Intervention Provide advice, education, support and counseling about physical activity/exercise needs.;Develop an individualized exercise prescription for aerobic and resistive training based on initial evaluation findings, risk stratification, comorbidities and participant's personal goals.   Expected Outcomes Achievement of increased cardiorespiratory fitness and enhanced flexibility, muscular endurance and strength shown through measurements of functional capacity and personal statement of participant.   Improve shortness of breath with ADL's Yes   Intervention Provide education, individualized exercise plan and daily activity instruction to help decrease symptoms of SOB with activities of daily living.   Expected Outcomes Short Term: Achieves a reduction of symptoms when performing activities of daily living.      Core Components/Risk Factors/Patient Goals Review:      Goals and Risk Factor Review    Row Name 11/11/15 1300 11/25/15 1131 12/24/15 0839 01/23/16 0800 02/24/16 9935     Core Components/Risk Factors/Patient Goals Review   Personal Goals Review Increase Strength and Stamina;Improve shortness of breath with ADL's Increase Strength and Stamina;Improve shortness of breath with ADL's  - Increase Strength and Stamina;Improve shortness of breath with ADL's Increase Strength and Stamina;Improve shortness of breath with ADL's   Review increase strength, stamina with exercise in program Has attended less than 5 exercise sessions, too soon to see improvement. Is making progress, nustep up to level 4, arm ergometer up to level 3, walking 9-15 laps on track Continues to  progress, arm ergometer level  increased to 4, walking track laps have not increased, nustep increased to level 4. Has 1 exercise session left, she was absent last week due to an illness, has made great progress with strength and stamina,   Expected Outcomes Improved QOL and ability to perform ADL's with more ease Should see an improvement in the next 30 days in strength and stamina Continue to see increase in strength and stamina with workload increase Continue to increase workloads as tolerated Has met her program goals.      Core Components/Risk Factors/Patient Goals at Discharge (Final Review):      Goals and Risk Factor Review - 02/24/16 0852      Core Components/Risk Factors/Patient Goals Review   Personal Goals Review Increase Strength and Stamina;Improve shortness of breath with ADL's   Review Has 1 exercise session left, she was absent last week due to an illness, has made great progress with strength and stamina,   Expected Outcomes Has met her program goals.      ITP Comments:   Comments: ITP REVIEW Pt is making expected progress toward pulmonary rehab goals after completing 23 sessions. Recommend continued exercise, life style modification, education, and utilization of breathing techniques to increase stamina and strength and decrease shortness of breath with exertion.

## 2016-02-27 ENCOUNTER — Encounter (HOSPITAL_COMMUNITY): Payer: Medicare Other

## 2016-03-02 ENCOUNTER — Ambulatory Visit (INDEPENDENT_AMBULATORY_CARE_PROVIDER_SITE_OTHER): Payer: Medicare Other | Admitting: Interventional Cardiology

## 2016-03-02 ENCOUNTER — Encounter: Payer: Self-pay | Admitting: Interventional Cardiology

## 2016-03-02 VITALS — BP 122/60 | HR 58 | Ht 64.0 in | Wt 116.6 lb

## 2016-03-02 DIAGNOSIS — I739 Peripheral vascular disease, unspecified: Secondary | ICD-10-CM

## 2016-03-02 DIAGNOSIS — I779 Disorder of arteries and arterioles, unspecified: Secondary | ICD-10-CM

## 2016-03-02 DIAGNOSIS — I10 Essential (primary) hypertension: Secondary | ICD-10-CM | POA: Diagnosis not present

## 2016-03-02 DIAGNOSIS — I480 Paroxysmal atrial fibrillation: Secondary | ICD-10-CM | POA: Diagnosis not present

## 2016-03-02 DIAGNOSIS — Z954 Presence of other heart-valve replacement: Secondary | ICD-10-CM

## 2016-03-02 DIAGNOSIS — J439 Emphysema, unspecified: Secondary | ICD-10-CM

## 2016-03-02 NOTE — Progress Notes (Signed)
Cardiology Office Note    Date:  03/02/2016   ID:  [name], DOB Sep 06, 1929, MRN [id number]  PCP:  Mellody Dance, DO  Cardiologist: Sinclair Grooms, MD   Chief Complaint  Patient presents with  . Cardiac Valve Problem    History of Present Illness:  [name] is a 82 y.o. female who presents for Hypertension, aortic valve bioprosthesis 2012, carotid disease, COPD, and hyperlipidemia.  Overall feels well. Oxygen therapy was started initially because of chronic dyspnea. She has not had syncope, or chest pain. She has chronic dyspnea at rest and with minimal exertion. There is also some orthopnea. She denies lower extremity swelling. She has had no sustained arrhythmias. She denies transient neurological symptoms.   Past Medical History:  Diagnosis Date  . Allergic rhinitis   . Aortic stenosis   . Carotid artery disease ([place])   . Colon polyps   . h/o Atrial fibrillation ([employer]) 03/22/2011   Brief post op   . Hematuria 11/05/2015  . Hypercalcemia   . Hypertension   . Hypothyroid   . Osteopenia   . 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 Pressure urticaria   . RLS (restless legs syndrome)   . S/P aortic valve replacement and aortoplasty 04/13/2011   78m Medtronic Freestyle porcine aortic root   . S/P aortic valve replacement with stentless valve 03/18/2011    Past Surgical History:  Procedure Laterality Date  . AORTIC ROOT REPLACEMENT  03/18/2011   257mMedtronic Freestyle Porcine aortic root with reimplantation of coronary arteries  . BREAST LUMPECTOMY     Left  . CATARACT EXTRACTION    . CHOLECYSTECTOMY    . FOOT SURGERY     Morton's neuroma  . KNEE ARTHROSCOPY     Left    Current Medications: Outpatient Medications Prior to Visit  Medication Sig Dispense Refill  . albuterol (PROVENTIL HFA;VENTOLIN HFA) 108 (90 Base) MCG/ACT inhaler Inhale 2 puffs into the lungs every 6 (six) hours as needed for wheezing or shortness of breath. 1 Inhaler 2  . albuterol  (PROVENTIL) (2.5 MG/3ML) 0.083% nebulizer solution Take 3 mLs (2.5 mg total) by nebulization every 4 (four) hours as needed for wheezing or shortness of breath. 300 mL 5  . aspirin 81 MG tablet Take 81 mg by mouth daily.    . DiphenhydrAMINE HCl (BENADRYL ALLERGY PO) Take 1 capsule by mouth at bedtime.    . Marland Kitchenevothyroxine (SYNTHROID, LEVOTHROID) 125 MCG tablet TAKE ONE TABLET BY MOUTH ONCE DAILY 90 tablet 0  . Multiple Vitamins-Minerals (MULTIVITAMIN WITH MINERALS) tablet Take 1 tablet by mouth daily.    . propranolol ER (INDERAL LA) 60 MG 24 hr capsule TAKE ONE CAPSULE BY MOUTH ONCE DAILY 90 capsule 0  . simvastatin (ZOCOR) 20 MG tablet Take 20 mg by mouth at bedtime.      . Marland KitchenTIOLTO RESPIMAT 2.5-2.5 MCG/ACT AERS INHALE TWO PUFFS INTO THE LUNGS ONCE DAILY 1 Inhaler 5  . azithromycin (ZITHROMAX) 250 MG tablet Take one daily for 5 days. (Patient not taking: Reported on 03/02/2016) 6 tablet 0  . Melatonin 3 MG TABS Take 2 tablets by mouth at bedtime.     No facility-administered medications prior to visit.      Allergies:   Clarithromycin; Tamsulosin; and Penicillins   Social History   Social History  . Marital status: Widowed    Spouse name: N/A  . Number of children: N  . Years of education: N/A   Occupational History  .  housewife    Social History Main Topics  . Smoking status: Former Smoker    Packs/day: 0.90    Years: 62.00    Types: Cigarettes    Quit date: 07/14/2010  . Smokeless tobacco: Never Used  . Alcohol use No  . Drug use: No  . Sexual activity: No   Other Topics Concern  . None   Social History Narrative  . None     Family History:  The patient's family history includes Asthma in her maternal aunt and maternal uncle; Breast cancer in her sister; Cancer in her mother; Emphysema in her maternal aunt; Heart disease in her maternal grandmother; Lung cancer in her father; Uterine cancer in her sister.   ROS:   Please see the history of present illness.      Progressed to chronic oxygen therapy. This is managed by Dr. Leonor Liv about. She has not noticed lower extremity swelling. She is unable to lie flat because of dyspnea. She awakens each morning with coughing and tight chest feeling that improves after arising. All other systems reviewed and are negative.   PHYSICAL EXAM:   VS:  BP 122/60   Pulse (!) 58   Ht _0  (1.626 m)   Wt 116 lb 9.6 oz (52.9 kg)   BMI 20.01 kg/m    GEN: Well nourished, well developed, in no acute distress  HEENT: normal  Neck: no JVD, carotid bruits, or masses Cardiac: RRR; no murmurs, rubs, or gallops,no edema. Increased P2 component of the second heart sound.  Respiratory:  clear to auscultation bilaterally, normal work of breathing GI: soft, nontender, nondistended, + BS MS: no deformity or atrophy  Skin: warm and dry, no rash Neuro:  Alert and Oriented x 3, Strength and sensation are intact Psych: euthymic mood, full affect  Wt Readings from Last 3 Encounters:  03/02/16 116 lb 9.6 oz (52.9 kg)  02/14/16 116 lb (52.6 kg)  02/13/16 117 lb 4.6 oz (53.2 kg)      Studies/Labs Reviewed:   EKG:  EKG  By atrial abnormality, prominent voltage consistent with LVH, otherwise no abnormality noted. No change compared to prior tracing done in October 2016.  Recent Labs: 11/08/2015: ALT 11; BUN 14; Creat 0.88; Hemoglobin 14.8; Platelets 219; Potassium 3.3; Sodium 141; TSH 0.91   Lipid Panel    Component Value Date/Time   CHOL 133 11/08/2015 1011   TRIG 84 11/08/2015 1011   HDL 63 11/08/2015 1011   CHOLHDL 2.1 11/08/2015 1011   VLDL 17 11/08/2015 1011   LDLCALC 53 11/08/2015 1011    Additional studies/ records that were reviewed today include:  Echocardiogram from 1 year ago, 02/2015 ------------------------------------------------------------------- Study Conclusions   - Left ventricle: Abnormal septal motion. The cavity size was   normal. Wall thickness was normal. Systolic function was normal.   The  estimated ejection fraction was in the range of 55% to 60%.   Wall motion was normal; there were no regional wall motion   abnormalities. Doppler parameters are consistent with elevated   ventricular end-diastolic filling pressure. - Aortic valve: Normal appearing tissue AVR. Valve area (VTI): 2.25   cm^2. Valve area (Vmean): 2.26 cm^2. - Atrial septum: No defect or patent foramen ovale was identified.      ASSESSMENT:    1. S/P aortic valve replacement with stentless valve   2. Essential hypertension   3. Paroxysmal atrial fibrillation ([facility])   4. Bilateral carotid artery disease ([place])   5. Pulmonary emphysema, unspecified emphysema type (  [place])      PLAN:  In order of problems listed above:  1. Valve auscultation is normal. No aortic regurgitation is heard. 2. No issues with blood pressure control. Not sure why she is on propranolol LA but I do not believe it is prescribed by cardiology. 3. Postoperative PAF has not recurred. 4. Statin therapy and aspirin are being used. 5. Severe and chronic O2 is now being used. This is followed by Dr. Lamonte Sakai.    Medication Adjustments/Labs and Tests Ordered: Current medicines are reviewed at length with the patient today.  Concerns regarding medicines are outlined above.  Medication changes, Labs and Tests ordered today are listed in the Patient Instructions below. Patient Instructions  Medication Instructions:  Your physician recommends that you continue on your current medications as directed. Please refer to the Current Medication list given to you today.   Labwork: none  Testing/Procedures: none  Follow-Up Your physician wants you to follow-up in: 12 months.  You will receive a reminder letter in the mail two months in advance. If you don't receive a letter, please call our office to schedule the follow-up appointment.   Any Other Special Instructions Will Be Listed Below (If Applicable).     If you need a refill on your  cardiac medications before your next appointment, please call your pharmacy.      Signed, Sinclair Grooms, MD  03/02/2016 2:16 PM    [facility] [address], [place], [place]  [address] Phone: [phone]; Fax: [phone]

## 2016-03-02 NOTE — Patient Instructions (Signed)
Medication Instructions:  Your physician recommends that you continue on your current medications as directed. Please refer to the Current Medication list given to you today.   Labwork: none  Testing/Procedures: none  Follow-Up Your physician wants you to follow-up in: 12 months.  You will receive a reminder letter in the mail two months in advance. If you don't receive a letter, please call our office to schedule the follow-up appointment.   Any Other Special Instructions Will Be Listed Below (If Applicable).     If you need a refill on your cardiac medications before your next appointment, please call your pharmacy.

## 2016-03-03 ENCOUNTER — Encounter (HOSPITAL_COMMUNITY)
Admission: RE | Admit: 2016-03-03 | Discharge: 2016-03-03 | Disposition: A | Discharge status: Home / Self Care | Payer: Medicare Other | Source: Ambulatory Visit | Attending: Emergency Medicine | Admitting: Emergency Medicine

## 2016-03-03 DIAGNOSIS — J438 Other emphysema: Secondary | ICD-10-CM

## 2016-03-04 ENCOUNTER — Other Ambulatory Visit: Payer: Self-pay

## 2016-03-04 MED ORDER — SIMVASTATIN 20 MG PO TABS: 20.0000 mg | ORAL_TABLET | Freq: Every day | ORAL | 0 refills | Status: DC

## 2016-03-05 NOTE — Progress Notes (Signed)
I was concerned about pulmonary impact. Plan switch to Bisoprolol 2.5 mg daily.

## 2016-03-10 ENCOUNTER — Encounter (HOSPITAL_COMMUNITY): Payer: Self-pay | Admitting: *Deleted

## 2016-03-11 ENCOUNTER — Telehealth: Payer: Self-pay | Admitting: *Deleted

## 2016-03-11 ENCOUNTER — Encounter: Payer: Self-pay | Admitting: Family Medicine

## 2016-03-11 ENCOUNTER — Ambulatory Visit (INDEPENDENT_AMBULATORY_CARE_PROVIDER_SITE_OTHER): Payer: Medicare Other | Admitting: Family Medicine

## 2016-03-11 VITALS — BP 144/60 | HR 67 | Ht 64.0 in | Wt 115.6 lb

## 2016-03-11 DIAGNOSIS — R7309 Other abnormal glucose: Secondary | ICD-10-CM

## 2016-03-11 DIAGNOSIS — I779 Disorder of arteries and arterioles, unspecified: Secondary | ICD-10-CM

## 2016-03-11 DIAGNOSIS — I10 Essential (primary) hypertension: Secondary | ICD-10-CM | POA: Diagnosis not present

## 2016-03-11 DIAGNOSIS — R0600 Dyspnea, unspecified: Secondary | ICD-10-CM | POA: Insufficient documentation

## 2016-03-11 DIAGNOSIS — Z954 Presence of other heart-valve replacement: Secondary | ICD-10-CM | POA: Diagnosis not present

## 2016-03-11 DIAGNOSIS — J439 Emphysema, unspecified: Secondary | ICD-10-CM

## 2016-03-11 DIAGNOSIS — J441 Chronic obstructive pulmonary disease with (acute) exacerbation: Secondary | ICD-10-CM | POA: Diagnosis not present

## 2016-03-11 DIAGNOSIS — R0609 Other forms of dyspnea: Secondary | ICD-10-CM

## 2016-03-11 DIAGNOSIS — Z79899 Other long term (current) drug therapy: Secondary | ICD-10-CM

## 2016-03-11 DIAGNOSIS — I739 Peripheral vascular disease, unspecified: Secondary | ICD-10-CM

## 2016-03-11 LAB — POCT GLYCOSYLATED HEMOGLOBIN (HGB A1C): Hemoglobin A1C: 5.7

## 2016-03-11 MED ORDER — CHLORTHALIDONE 25 MG PO TABS: 12.5000 mg | ORAL_TABLET | Freq: Every day | ORAL | 0 refills | Status: DC

## 2016-03-11 MED ORDER — PREDNISONE 10 MG PO TABS: ORAL_TABLET | ORAL | 0 refills | Status: DC

## 2016-03-11 MED ORDER — AMLODIPINE BESYLATE 5 MG PO TABS: 2.5000 mg | ORAL_TABLET | Freq: Every day | ORAL | 0 refills | Status: DC

## 2016-03-11 NOTE — Telephone Encounter (Signed)
-----  Message from Belva Crome, MD sent at 03/08/2016  4:05 PM EDT ----- There is question of Propranolol being switched to another beta blocker. I recommend Metoprolol tartrate 12.5 mg BID or Metoprolol succinate 25 mg daily. ----- Message ----- From: Loren Racer, LPN Sent: [id number]/54/[id number]  10:08 AM To: Belva Crome, MD  Hey Dr. Tamala Julian,  I got the [place] chart you send me with the note from Dr. Raliegh Scarlet.  I am unable to see anything you wrote though.  Did you want to change her BB or did you have a plan for her?  I'm not sure why nothing is showing up.  Thanks Maude Leriche

## 2016-03-11 NOTE — Telephone Encounter (Signed)
Okay. We will let her handle manage.

## 2016-03-11 NOTE — Telephone Encounter (Signed)
Spoke with pt and she just seen Dr. Raliegh Scarlet this morning.  Dr. Raliegh Scarlet d/c'ed BB due to worsening pulm state.  Asked if Dr. Raliegh Scarlet mentioned Metoprolol and pt said she did but decided not to start a BB but did start Amlodipine 2.56m QD.  Pt is seeing Dr. ORaliegh Scarletagain on 03/16/16.  Will forward to Dr. STamala Julianto make him aware.

## 2016-03-11 NOTE — Patient Instructions (Signed)
Tonya, my CMA, is aware of plan of care for her GM.  All questions answered.

## 2016-03-11 NOTE — Progress Notes (Signed)
Impression and Recommendations:    1. COPD with acute exacerbation ([place])   2. Pulmonary emphysema, unspecified emphysema type ([place])   3. Essential hypertension   4. S/P aortic valve replacement with stentless valve   5. Bilateral carotid artery disease ([place])   6. Dyspnea on exertion   7. Elevated hemoglobin A1c     Stop beta blocker due to worsening pulm state   Restart amlodipine ( slowly titrate up) she used to be on and start chlorthalidone.  For acute exacerbation COPD we'll do a taper of prednisone and then continue 10 mg daily until she is seen in f/up by Pulm.  Pt and granddaughter, who is in healthcare, both feel this worked great in past, pt tolerated it well and due to many of her sx being secondary to not being able to eat much because of increasing SOB despite current txmnt, is needed  With her elevated A1c in June of 6.0, we discussed the risk and benefits of prednisone use,  all questions were answered and patient consents to regimen.  Obtain Labs today.     A1c down to 5.7--> great.  Have your granddaughter frequently follow your blood pressures- at least once daily  follow-up in 3-4 days with [place].   New Prescriptions   AMLODIPINE (NORVASC) 5 MG TABLET    Take 0.5 tablets (2.5 mg total) by mouth daily.   CHLORTHALIDONE (HYGROTON) 25 MG TABLET    Take 0.5 tablets (12.5 mg total) by mouth daily.   PREDNISONE (DELTASONE) 10 MG TABLET    Take 4 a day for 3 days, 3 a day for 3 days, 2 a day for 3 days, then 1 tab QD    Modified Medications   No medications on file    Discontinued Medications   PROPRANOLOL ER (INDERAL LA) 60 MG 24 HR CAPSULE    TAKE ONE CAPSULE BY MOUTH ONCE DAILY    The patient was counseled, risk factors were discussed, anticipatory guidance given.  Gross side effects, risk and benefits, and alternatives of medications and treatment plan in general discussed with patient.  Patient is aware that all medications have potential side effects  and we are unable to predict every side effect or drug-drug interaction that may occur.   Patient will call with any questions prior to using medication if they have concerns.  Expresses verbal understanding and consents to current therapy and treatment regimen.  No barriers to understanding were identified.  Red flag symptoms and signs discussed in detail.  Patient expressed understanding regarding what to do in case of emergency\urgent symptoms  Return in about 3 days (around 03/14/2016) for Follow-up of current medical issues.  Please see AVS handed out to patient at the end of our visit for further patient instructions/ counseling done pertaining to today's office visit.    Note: This document was prepared using Dragon voice recognition software and may include unintentional dictation errors.   --------------------------------------------------------------------------------------------------------------------------------------------------------------------------------------------------------------------------------------------    Subjective:    CC: DOE/ Tired  HPI: [name] is a 82 y.o. female who presents to [place] at [facility] today for issues as discussed below.  " I feel like I'm going down." "no energy",  "don't want to eat, move or anything."  2 wks now at least of worsening SOB/ doe--> much more so than usual. Grad onset.   Blood O2 drops lower than 88--> gets HA's.  Drops as low as 82- nothing new.   "Chest  tightness" like she is in a vice, difficult to breath--> on and off for wks now- never mentioned prior.   Usually just sitting around when occurs.   Can walk around [employer] 30mn- no CP.    Not eating much at all b/c "too much of a struggle for air", diarrhea one day last c of wks, now this week--> past 2 days ( going 2-3 times/ d and even in panties).   transient episode of Dizziness one day last week only--> it was after she took mucinex one day--> gone  away ever since.  No orthostatic sx.    For Bp--- pt used to be on amlodipine 121m and HCTZ  2512mn past.   Pt has been on propranolol per Cards-  Since 10/16--  And it seems pulm fxn has dec significantly since then.  Worsening COPD after and O2 therapy started the following May.      Patient weighed 125 pounds at office visit on 2\3\17.  PHQ-2  Negative.    Wt Readings from Last 3 Encounters:  03/11/16 115 lb 9.6 oz (52.4 kg)  03/02/16 116 lb 9.6 oz (52.9 kg)  02/14/16 116 lb (52.6 kg)   BP Readings from Last 3 Encounters:  03/11/16 (!) 144/60  03/02/16 122/60  02/14/16 110/68   Pulse Readings from Last 3 Encounters:  03/11/16 67  03/02/16 (!) 58  02/14/16 60   BMI Readings from Last 3 Encounters:  03/11/16 19.84 kg/m  03/02/16 20.01 kg/m  02/14/16 21.05 kg/m     Patient Care Team    Relationship Specialty Notifications Start End  DebMellody DanceO PCP - General Family Medicine  10/08/15   ClaRexene AlbertsD  Cardiothoracic Surgery  03/05/11   HenBelva CromeD  Cardiology  03/05/11   RobCollene GobbleD Consulting Physician Pulmonary Disease  11/05/15   SarMagdalen SpatzP Nurse Practitioner Pulmonary Disease  11/05/15     Patient Active Problem List   Diagnosis Date Noted  . S/P aortic valve replacement with stentless valve 03/18/2011    Priority: High  . Carotid artery disease ([place]   Priority: High  . Hypertension     Priority: High  . COPD (chronic obstructive pulmonary disease) with emphysema ([facility]/15/2012    Priority: High  . Osteopenia 11/05/2015    Priority: Medium  . h/o Intention tremor  11/05/2015    Priority: Medium  . Sleep disorder 12/11/2015  . Elevated hemoglobin A1c 11/13/2015  . Vitamin deficiency 11/13/2015  . 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 Allergic rhinitis 11/05/2015  . Hematuria 11/05/2015  . Hypothyroidism; on synthroid 11/05/2015  . h/o Hypercalcemia 11/05/2015  . h/o RLS (restless legs syndrome) 11/05/2015  . History  of nephrolithiasis 11/05/2015  . S/P aortic valve replacement and aortoplasty 04/13/2011  . h/o Atrial fibrillation ([facility]/08/2010    Past Medical history, Surgical history, Family history, Social history, Allergies and Medications have been entered into the medical record, reviewed and changed as needed.   Allergies:  Allergies  Allergen Reactions  . Clarithromycin     Mouth/throat sore  . Tamsulosin Itching  . Penicillins Itching and Rash    Review of Systems  Constitutional: Positive for malaise/fatigue. Negative for chills, diaphoresis, fever and weight loss.       2-3 wks  HENT: Positive for congestion. Negative for nosebleeds, sore throat and tinnitus.        HA come on when hypoxic  Eyes: Negative for blurred vision  and photophobia.  Respiratory: Positive for cough, shortness of breath and wheezing. Negative for sputum production and stridor.        Chest tightness  Cardiovascular: Positive for chest pain. Negative for palpitations, orthopnea, leg swelling and PND.       "tight chest"/ can';t breath  Gastrointestinal: Positive for diarrhea. Negative for blood in stool, nausea and vomiting.       Having one day of diarrhea every week for the last 3 weeks  Genitourinary: Negative.  Negative for dysuria, frequency and urgency.  Musculoskeletal: Negative.  Negative for joint pain and myalgias.  Skin: Negative.  Negative for itching and rash.  Neurological: Positive for dizziness, weakness and headaches. Negative for tremors, sensory change, speech change, focal weakness and loss of consciousness.       Only one episode; only after taking new medicine-Mucinex over-the-counter.  Endo/Heme/Allergies: Negative.  Negative for environmental allergies and polydipsia. Does not bruise/bleed easily.  Psychiatric/Behavioral: Negative.  Negative for depression and memory loss. The patient is not nervous/anxious and does not have insomnia.      Objective:   Blood pressure (!) 144/60,  pulse 67, height _0  (1.626 m), weight 115 lb 9.6 oz (52.4 kg), SpO2 95 %. Body mass index is 19.84 kg/m. General: Appears cachectic , poorly nourished, appropriate for stated age.  Neuro: Alert and oriented x3, extra-ocular muscles intact, sensation grossly intact.  HEENT: Normocephalic, atraumatic, neck supple   Skin: Warm and dry, no gross rash. Cardiac: RRR, S1 S2,  +M, No rubs or gallops.  Respiratory: + dec aeration globally, ++ Wh on inhalation> Exh and dec BS exhalation;, Not using accessory muscles, speaking in full sentences, but somewhat labored. Vascular:  No gross lower ext edema, cap RF less 2 sec. Psych: No HI/SI, judgement and insight good, Euthymic mood. Full Affect.

## 2016-03-12 LAB — COMPLETE METABOLIC PANEL WITH GFR
ALBUMIN: 3.7 g/dL (ref 3.6–5.1)
ALK PHOS: 47 U/L (ref 33–130)
ALT: 14 U/L (ref 6–29)
AST: 17 U/L (ref 10–35)
BILIRUBIN TOTAL: 0.4 mg/dL (ref 0.2–1.2)
BUN: 11 mg/dL (ref 7–25)
CO2: 39 mmol/L — AB (ref 20–31)
CREATININE: 0.9 mg/dL — AB (ref 0.60–0.88)
Calcium: 9.9 mg/dL (ref 8.6–10.4)
Chloride: 96 mmol/L — ABNORMAL LOW (ref 98–110)
GFR, EST AFRICAN AMERICAN: 68 mL/min (ref >=60)
GFR, Est Non African American: 59 mL/min — ABNORMAL LOW (ref >=60)
GLUCOSE: 102 mg/dL — AB (ref 65–99)
Potassium: 4.1 mmol/L (ref 3.5–5.3)
Sodium: 139 mmol/L (ref 135–146)
TOTAL PROTEIN: 6.4 g/dL (ref 6.1–8.1)

## 2016-03-12 LAB — CBC WITH DIFFERENTIAL/PLATELET
BASOS ABS: 0 {cells}/uL (ref 0–200)
BASOS PCT: 0 %
EOS ABS: 675 {cells}/uL — AB (ref 15–500)
Eosinophils Relative: 9 %
HEMATOCRIT: 42 % (ref 35.0–45.0)
HEMOGLOBIN: 14.1 g/dL (ref 11.7–15.5)
LYMPHS ABS: 1200 {cells}/uL (ref 850–3900)
Lymphocytes Relative: 16 %
MCH: 32.3 pg (ref 27.0–33.0)
MCHC: 33.6 g/dL (ref 32.0–36.0)
MCV: 96.3 fL (ref 80.0–100.0)
MONO ABS: 750 {cells}/uL (ref 200–950)
MONOS PCT: 10 %
MPV: 9.2 fL (ref 7.5–12.5)
NEUTROS ABS: 4875 {cells}/uL (ref 1500–7800)
Neutrophils Relative %: 65 %
PLATELETS: 157 10*3/uL (ref 140–400)
RBC: 4.36 MIL/uL (ref 3.80–5.10)
RDW: 13.1 % (ref 11.0–15.0)
WBC: 7.5 10*3/uL (ref 3.8–10.8)

## 2016-03-12 LAB — TSH: TSH: 0.92 mIU/L

## 2016-03-16 ENCOUNTER — Encounter: Payer: Self-pay | Admitting: Family Medicine

## 2016-03-16 ENCOUNTER — Ambulatory Visit (INDEPENDENT_AMBULATORY_CARE_PROVIDER_SITE_OTHER): Payer: Medicare Other | Admitting: Family Medicine

## 2016-03-16 VITALS — BP 124/66 | HR 87 | Ht 64.0 in | Wt 115.0 lb

## 2016-03-16 DIAGNOSIS — I10 Essential (primary) hypertension: Secondary | ICD-10-CM | POA: Diagnosis not present

## 2016-03-16 DIAGNOSIS — M8580 Other specified disorders of bone density and structure, unspecified site: Secondary | ICD-10-CM

## 2016-03-16 DIAGNOSIS — Z9981 Dependence on supplemental oxygen: Secondary | ICD-10-CM

## 2016-03-16 DIAGNOSIS — Z79899 Other long term (current) drug therapy: Secondary | ICD-10-CM

## 2016-03-16 DIAGNOSIS — J439 Emphysema, unspecified: Secondary | ICD-10-CM

## 2016-03-16 DIAGNOSIS — E569 Vitamin deficiency, unspecified: Secondary | ICD-10-CM | POA: Diagnosis not present

## 2016-03-16 DIAGNOSIS — G252 Other specified forms of tremor: Secondary | ICD-10-CM

## 2016-03-16 DIAGNOSIS — E038 Other specified hypothyroidism: Secondary | ICD-10-CM

## 2016-03-16 DIAGNOSIS — R7309 Other abnormal glucose: Secondary | ICD-10-CM

## 2016-03-16 NOTE — Assessment & Plan Note (Signed)
Was 5.7 earlier this month.  Stable. We'll continue to monitor

## 2016-03-16 NOTE — Patient Instructions (Addendum)
Continue taper of the prednisone and then once you're done with that, cut your tabs in half and just take 5 mg prednisone daily, and then even every other day after-  if you can tolerate that.    - Pt declines 59m tabs today b/c "she has enough" of the 153mtabs.

## 2016-03-16 NOTE — Assessment & Plan Note (Signed)
Patient denies symptoms as of today

## 2016-03-16 NOTE — Assessment & Plan Note (Signed)
Stable

## 2016-03-16 NOTE — Assessment & Plan Note (Addendum)
-  Clinical state very much improved from prior.  I would like her to be on less than 5 mg prednisone daily or when necessary.   -Told patient to discuss with her pulmonologist regarding better long-term control of her emphysema and goals to deal with acute flares in future.  - Scheduled for repeat PFT's-  April 06 2016 with follow-up with her pulmonologist after.  Further management per [facility]

## 2016-03-16 NOTE — Assessment & Plan Note (Addendum)
We will need repeat bone density near future. Order placed.  Continue calcium and vitamin D supplements

## 2016-03-16 NOTE — Assessment & Plan Note (Addendum)
Blood pressure stable and improved since off beta blocker.   Asymptomatic

## 2016-03-16 NOTE — Progress Notes (Signed)
Impression and Recommendations:    1. Pulmonary emphysema, unspecified emphysema type ([place])   2. Hypertension, unspecified type   3. Osteopenia, unspecified location   4. Vitamin deficiency   5. Other specified hypothyroidism   6. Medication management   7. Elevated hemoglobin A1c   8. Oxygen dependent   9. h/o Intention tremor     COPD (chronic obstructive pulmonary disease) with emphysema (HCC) -Clinical state very much improved from prior.  I would like her to be on less than 5 mg prednisone daily or when necessary.   -Told patient to discuss with her pulmonologist regarding better long-term control of her emphysema and goals to deal with acute flares in future.  - Scheduled for repeat PFT's-  April 06 2016 with follow-up with her pulmonologist after.  Further management per Pulm  Hypertension Blood pressure stable and improved since off beta blocker.   Asymptomatic  Elevated hemoglobin A1c Was 5.7 earlier this month.  Stable. We'll continue to monitor  Osteopenia We will need repeat bone density near future. Order placed.  Continue calcium and vitamin D supplements  h/o Intention tremor  Patient denies symptoms as of today  Oxygen dependent Stable  New Prescriptions   No medications on file    Modified Medications   No medications on file    Discontinued Medications   No medications on file    The patient was counseled, risk factors were discussed, anticipatory guidance given.  Return in about 3 months (around 06/16/2016) for Follow-up of current medical issues.  Please see AVS handed out to patient at the end of our visit for further patient instructions/ counseling done pertaining to today's office visit.    Note: This document was prepared using Dragon voice recognition software and may include unintentional dictation  errors.   --------------------------------------------------------------------------------------------------------------------------------------------------------------------------------------------------------------------------------------------    Subjective:    CC:  Chief Complaint  Patient presents with  . COPD  . Hypertension    HPI: [name] is a 82 y.o. female who presents to [place] at [facility] today for issues as discussed below.  Last OV with me 39\43\20 A/P Notes:    Stop beta blocker due to worsening pulm state   Restart amlodipine ( slowly titrate up) she used to be on and start chlorthalidone.  For acute exacerbation COPD we'll do a taper of prednisone and then continue 5 mg daily until she is seen in f/up by Pulm.  Pt and granddaughter, who is in healthcare, both feel this worked great in past, pt tolerated it well and due to many of her sx being secondary to not being able to eat much because of increasing SOB despite current txmnt, is needed  With her elevated A1c in June of 6.0, we discussed the risk and benefits of prednisone use, all questions were answered and patient consents to regimen.  Obtain Labs today.     A1c down to 5.7--> great.  Have your granddaughter frequently follow your blood pressures- at least once daily  follow-up in 3-4 days with [place].   Today:  -Emphysema: Patient presents to clinic today with at least 80% improvement with breathing. She looks much more spunky, improved color and not as cachectic appearing.  She actually is able to eat, and to be quite active again as she once was.  Able to go shopping and walk around [place], appetite is back, was able to clean house and even do a little yardwork like she normally does.  Pulse ox was in the low 90s last office visit-now 97 and 98 when I rechecked.   - She has upcoming office visit with pulmonology on November 20. She will be getting  PFTs and seeing her  pulmonologist then.  - Blood pressure and pulse much improved now off the beta blocker,  and HCTZ changed to chlorthalidone.  Tolerating restart of amlodipine well.  No peripheral edema noted.     Wt Readings from Last 3 Encounters:  03/16/16 115 lb (52.2 kg)  03/11/16 115 lb 9.6 oz (52.4 kg)  03/02/16 116 lb 9.6 oz (52.9 kg)   BP Readings from Last 3 Encounters:  03/16/16 124/66  03/11/16 (!) 144/60  03/02/16 122/60   Pulse Readings from Last 3 Encounters:  03/16/16 87  03/11/16 67  03/02/16 (!) 58   BMI Readings from Last 3 Encounters:  03/16/16 19.74 kg/m  03/11/16 19.84 kg/m  03/02/16 20.01 kg/m     Patient Care Team    Relationship Specialty Notifications Start End  Mellody Dance, DO PCP - General Family Medicine  10/08/15   Rexene Alberts, MD  Cardiothoracic Surgery  03/05/11   Belva Crome, MD  Cardiology  03/05/11   Collene Gobble, MD Consulting Physician Pulmonary Disease  11/05/15   Magdalen Spatz, NP Nurse Practitioner Pulmonary Disease  11/05/15     Patient Active Problem List   Diagnosis Date Noted  . S/P aortic valve replacement with stentless valve 03/18/2011    Priority: High  . Carotid artery disease ([place])     Priority: High  . Hypertension     Priority: High  . COPD (chronic obstructive pulmonary disease) with emphysema ([place]) 03/02/2011    Priority: High  . Osteopenia 11/05/2015    Priority: Medium  . h/o Intention tremor  11/05/2015    Priority: Medium  . Medication management 03/11/2016  . Dyspnea on exertion 03/11/2016  . Sleep disorder 12/11/2015  . Elevated hemoglobin A1c 11/13/2015  . Vitamin deficiency 11/13/2015  . 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 Allergic rhinitis 11/05/2015  . Hematuria 11/05/2015  . Hypothyroidism; on synthroid 11/05/2015  . h/o Hypercalcemia 11/05/2015  . h/o RLS (restless legs syndrome) 11/05/2015  . History of nephrolithiasis 11/05/2015  . S/P aortic valve replacement and aortoplasty 04/13/2011  . h/o  Atrial fibrillation ([place]) 03/22/2011    Past Medical history, Surgical history, Family history, Social history, Allergies and Medications have been entered into the medical record, reviewed and changed as needed.   Allergies:  Allergies  Allergen Reactions  . Clarithromycin     Mouth/throat sore  . Tamsulosin Itching  . Penicillins Itching and Rash    Review of Systems  Constitutional: Negative.  Negative for chills, diaphoresis, fever, malaise/fatigue and weight loss.  HENT: Negative.  Negative for congestion, sore throat and tinnitus.   Eyes: Negative.  Negative for blurred vision, double vision and photophobia.  Respiratory: Negative.  Negative for cough, hemoptysis, sputum production, shortness of breath and wheezing.        Denies SOB - much improved from her baseline  Cardiovascular: Negative.  Negative for chest pain and palpitations.  Gastrointestinal: Negative.  Negative for blood in stool, diarrhea, nausea and vomiting.  Genitourinary: Negative.  Negative for dysuria, frequency and urgency.  Musculoskeletal: Negative.  Negative for joint pain and myalgias.  Skin: Negative.  Negative for itching and rash.  Neurological: Negative.  Negative for dizziness, focal weakness, weakness and headaches.  Endo/Heme/Allergies: Negative.  Negative for environmental allergies and polydipsia.  Does not bruise/bleed easily.  Psychiatric/Behavioral: Negative.  Negative for depression and memory loss. The patient is not nervous/anxious and does not have insomnia.      Objective:   Blood pressure 124/66, pulse 87, height _0  (1.626 m), weight 115 lb (52.2 kg), SpO2 97 %. Body mass index is 19.74 kg/m. General: Well Developed, well nourished, appropriate for stated age.  Neuro: Alert and oriented x3, extra-ocular muscles intact, sensation grossly intact.  HEENT: Normocephalic, atraumatic, neck supple  Skin: Warm and dry, no gross rash. Cardiac: RRR, S1 S2,  + M, No rubs or gallops.   Respiratory: ECTA B/L- improved aeration but decreased globally with decrease exhalation phase. Occasional wheeze now, Not using accessory muscles, speaking in full sentences-unlabored. Vascular:  No gross lower ext edema, cap RF less 2 sec. Psych: No HI/SI, judgement and insight good, Euthymic mood. Full Affect.

## 2016-03-17 NOTE — Addendum Note (Signed)
Encounter addended by: Jewel Baize, RD on: 03/17/2016 10:14 AM<BR>    Actions taken: Flowsheet data copied forward, Visit Navigator Flowsheet section accepted

## 2016-03-24 LAB — HM DEXA SCAN

## 2016-04-06 ENCOUNTER — Ambulatory Visit: Payer: Medicare Other | Admitting: Emergency Medicine

## 2016-04-21 NOTE — Addendum Note (Signed)
Encounter addended by: Jewel Baize, RD on: 04/21/2016  1:40 PM<BR>    Actions taken: Flowsheet data copied forward, Visit Navigator Flowsheet section accepted

## 2016-04-23 ENCOUNTER — Other Ambulatory Visit: Payer: Medicare Other

## 2016-04-27 NOTE — Addendum Note (Signed)
Encounter addended by: Lance Morin, RN on: 04/27/2016 11:25 AM<BR>    Actions taken: Sign clinical note, Episode resolved

## 2016-04-27 NOTE — Progress Notes (Signed)
Discharge Summary  Patient Details  Name: [name] MRN: [id number] Date of Birth: 12-13-1929 Referring Provider:   [facility] Walk Test from 11/12/2015 in [place]  Referring Provider  Dr. Lamonte Sakai       Number of Visits: 23  Reason for Discharge:  Patient independent in their exercise.  Smoking History:  History  Smoking Status  . Former Smoker  . Packs/day: 0.90  . Years: 62.00  . Types: Cigarettes  . Quit date: 07/14/2010  Smokeless Tobacco  . Never Used    Diagnosis:  Other emphysema ([place])  ADL UCSD:     Pulmonary Assessment Scores    Row Name 11/12/15 1442 03/10/16 1628       ADL UCSD   ADL Phase Entry Exit    SOB Score total 61 61       Initial Exercise Prescription:     Initial Exercise Prescription - 11/12/15 1600      Date of Initial Exercise RX and Referring Provider   Date 11/12/15   Referring Provider Dr. Lamonte Sakai     Oxygen   Oxygen Continuous   Liters 3     NuStep   Level 1   Minutes 15   METs 1.3     Arm/Foot Ergometer   Level 1   Minutes 15     Track   Laps 5   Minutes 15     Prescription Details   Frequency (times per week) 2   Duration Progress to 45 minutes of aerobic exercise without signs/symptoms of physical distress     Intensity   THRR 40-80% of Max Heartrate 55-110   Ratings of Perceived Exertion 11-13   Perceived Dyspnea 0-4     Progression   Progression Continue progressive overload as per policy without signs/symptoms or physical distress.     Resistance Training   Training Prescription Yes   Weight Orange Bands   Reps 10-12      Discharge Exercise Prescription (Final Exercise Prescription Changes):     Exercise Prescription Changes - 02/13/16 1500      Response to Exercise   Blood Pressure (Admit) 110/60   Blood Pressure (Exercise) 140/80   Blood Pressure (Exit) 120/60   Heart Rate (Admit) 73 bpm   Heart Rate (Exercise) 94 bpm   Heart Rate  (Exit) 69 bpm   Oxygen Saturation (Admit) 95 %   Oxygen Saturation (Exercise) 93 %   Oxygen Saturation (Exit) 96 %   Rating of Perceived Exertion (Exercise) 13   Perceived Dyspnea (Exercise) 2   Duration Progress to 45 minutes of aerobic exercise without signs/symptoms of physical distress   Intensity THRR unchanged     Progression   Progression Continue to progress workloads to maintain intensity without signs/symptoms of physical distress.     Resistance Training   Training Prescription Yes   Weight orange bands   Reps 10-12  10 minutes of strength training     Interval Training   Interval Training No     Oxygen   Oxygen Continuous   Liters 2     Arm Ergometer   Level 5   Minutes 17     Track   Laps 8   Minutes 17      Functional Capacity:     6 Minute Walk    Row Name 11/12/15 1623 03/05/16 0726       6 Minute Walk   Phase Initial Discharge    Distance  1005 feet 1200 feet    Walk Time 6 minutes 6 minutes    # of Rest Breaks 0 0    MPH 1.9 2.27    METS 2.45 2.68    RPE 11 11    Perceived Dyspnea  1 1    Symptoms No No    Resting HR 72 bpm 65 bpm    Resting BP 96/60 102/50    Max Ex. HR 68 bpm 89 bpm    Max Ex. BP 120/50 108/60      Interval HR   Baseline HR 72 65    1 Minute HR 64 69    2 Minute HR 67 73    3 Minute HR 67 78    4 Minute HR 68 89    5 Minute HR 68 89    6 Minute HR 68 88    2 Minute Post HR 60 68    Interval Heart Rate? Yes Yes      Interval Oxygen   Interval Oxygen? Yes Yes    Baseline Oxygen Saturation % 95 % 92 %    Baseline Liters of Oxygen 3 L 3 L    1 Minute Oxygen Saturation % 96 % 93 %    1 Minute Liters of Oxygen 3 L 3 L    2 Minute Oxygen Saturation % 97 % 94 %    2 Minute Liters of Oxygen 3 L 3 L    3 Minute Oxygen Saturation % 94 % 93 %    3 Minute Liters of Oxygen 3 L 3 L    4 Minute Oxygen Saturation % 96 % 93 %    4 Minute Liters of Oxygen 3 L 3 L    5 Minute Oxygen Saturation % 96 % 89 %    5 Minute  Liters of Oxygen 3 L 3 L    6 Minute Oxygen Saturation % 96 % 89 %    6 Minute Liters of Oxygen 3 L 3 L    2 Minute Post Oxygen Saturation % 97 % 96 %    2 Minute Post Liters of Oxygen 3 L 3 L       Psychological, QOL, Others - Outcomes: PHQ 2/9: Depression screen [facility] 2/9 03/03/2016 02/14/2016 11/13/2015 11/11/2015 06/21/2015  Decreased Interest 0 0 0 0 0  Down, Depressed, Hopeless 0 0 0 0 0  PHQ - 2 Score 0 0 0 0 0    Quality of Life:     Quality of Life - 03/10/16 1628      Quality of Life Scores   Health/Function Post 22.75 %   Socioeconomic Post 23.39 %   Psych/Spiritual Post 27.07 %   Family Post 24 %   GLOBAL Post 23.95 %      Personal Goals: Goals established at orientation with interventions provided to work toward goal.     Personal Goals and Risk Factors at Admission - 11/11/15 1258      Core Components/Risk Factors/Patient Goals on Admission   Increase Strength and Stamina Yes   Intervention Provide advice, education, support and counseling about physical activity/exercise needs.;Develop an individualized exercise prescription for aerobic and resistive training based on initial evaluation findings, risk stratification, comorbidities and participant's personal goals.   Expected Outcomes Achievement of increased cardiorespiratory fitness and enhanced flexibility, muscular endurance and strength shown through measurements of functional capacity and personal statement of participant.   Improve shortness of breath with ADL's Yes   Intervention Provide  education, individualized exercise plan and daily activity instruction to help decrease symptoms of SOB with activities of daily living.   Expected Outcomes Short Term: Achieves a reduction of symptoms when performing activities of daily living.       Personal Goals Discharge:     Goals and Risk Factor Review    Row Name 11/11/15 1300 11/25/15 1131 12/24/15 0839 01/23/16 0800 02/24/16 2761     Core Components/Risk  Factors/Patient Goals Review   Personal Goals Review Increase Strength and Stamina;Improve shortness of breath with ADL's Increase Strength and Stamina;Improve shortness of breath with ADL's  - Increase Strength and Stamina;Improve shortness of breath with ADL's Increase Strength and Stamina;Improve shortness of breath with ADL's   Review increase strength, stamina with exercise in program Has attended less than 5 exercise sessions, too soon to see improvement. Is making progress, nustep up to level 4, arm ergometer up to level 3, walking 9-15 laps on track Continues to progress, arm ergometer level increased to 4, walking track laps have not increased, nustep increased to level 4. Has 1 exercise session left, she was absent last week due to an illness, has made great progress with strength and stamina,   Expected Outcomes Improved QOL and ability to perform ADL's with more ease Should see an improvement in the next 30 days in strength and stamina Continue to see increase in strength and stamina with workload increase Continue to increase workloads as tolerated Has met her program goals.      Nutrition & Weight - Outcomes:     Pre Biometrics - 11/11/15 1256      Pre Biometrics   Grip Strength 24 kg       Nutrition:     Nutrition Therapy & Goals - 12/19/15 1504      Nutrition Therapy   Diet General, Healthful     Personal Nutrition Goals   Personal Goal #1 Maintain current wt or gain wt to UBW range of 122-129 lb     Intervention Plan   Intervention Prescribe, educate and counsel regarding individualized specific dietary modifications aiming towards targeted core components such as weight, hypertension, lipid management, diabetes, heart failure and other comorbidities.   Expected Outcomes Short Term Goal: Understand basic principles of dietary content, such as calories, fat, sodium, cholesterol and nutrients.;Long Term Goal: Adherence to prescribed nutrition plan.      Nutrition  Discharge:     Nutrition Assessments - 03/17/16 0959      Rate Your Plate Scores   Pre Score 50   Post Score 47      Education Questionnaire Score:     Knowledge Questionnaire Score - 03/10/16 1628      Knowledge Questionnaire Score   Post Score 11/13      Goals reviewed with patient; copy given to patient.

## 2016-04-30 ENCOUNTER — Other Ambulatory Visit: Payer: Medicare Other

## 2016-05-12 ENCOUNTER — Other Ambulatory Visit: Payer: Self-pay | Admitting: Family Medicine

## 2016-05-18 DIAGNOSIS — J449 Chronic obstructive pulmonary disease, unspecified: Secondary | ICD-10-CM | POA: Diagnosis not present

## 2016-05-19 ENCOUNTER — Encounter: Payer: Self-pay | Admitting: Family Medicine

## 2016-05-19 ENCOUNTER — Ambulatory Visit (INDEPENDENT_AMBULATORY_CARE_PROVIDER_SITE_OTHER): Payer: Medicare Other | Admitting: Family Medicine

## 2016-05-19 VITALS — BP 109/61 | HR 75 | Temp 98.1°F | Wt 120.0 lb

## 2016-05-19 DIAGNOSIS — R05 Cough: Secondary | ICD-10-CM | POA: Diagnosis not present

## 2016-05-19 DIAGNOSIS — J439 Emphysema, unspecified: Secondary | ICD-10-CM | POA: Diagnosis not present

## 2016-05-19 DIAGNOSIS — J209 Acute bronchitis, unspecified: Secondary | ICD-10-CM | POA: Diagnosis not present

## 2016-05-19 DIAGNOSIS — R059 Cough, unspecified: Secondary | ICD-10-CM

## 2016-05-19 MED ORDER — AMOXICILLIN-POT CLAVULANATE 875-125 MG PO TABS: 1.0000 | ORAL_TABLET | Freq: Two times a day (BID) | ORAL | 0 refills | Status: DC

## 2016-05-19 MED ORDER — HYDROCOD POLST-CPM POLST ER 10-8 MG/5ML PO SUER: 5.0000 mL | Freq: Two times a day (BID) | ORAL | 0 refills | Status: DC | PRN

## 2016-05-19 NOTE — Progress Notes (Signed)
Acute Care Office visit  Assessment and plan:  1. Acute bronchitis, unspecified organism   2. Cough   3. Chronic emphysema     - Augmentin, Tussionex, albuterol prn, cont with current dose of prednisone as no inc sx from baseline - Supportive care and various OTC medications discussed in addition to any prescribed. - Call or RTC if new symptoms, or if no improvement or worse over next couple days.   - ABX if sx continue past 7-10 days and worsening.    Anticipatory guidance and routine counseling done re: condition, txmnt options and need for follow up. All questions of patient's were answered.    New Prescriptions   AMOXICILLIN-CLAVULANATE (AUGMENTIN) 875-125 MG TABLET    Take 1 tablet by mouth 2 (two) times daily.   CHLORPHENIRAMINE-HYDROCODONE (TUSSIONEX) 10-8 MG/5ML SUER    Take 5 mLs by mouth every 12 (twelve) hours as needed for cough (cough, will cause drowsiness.).   TRAMADOL (ULTRAM) 50 MG TABLET    Take 1 tablet (50 mg total) by mouth every 6 (six) hours as needed.    Modified Medications   Modified Medication Previous Medication   AMLODIPINE (NORVASC) 5 MG TABLET amLODipine (NORVASC) 5 MG tablet      TAKE ONE-HALF TABLET BY MOUTH ONCE DAILY    Take 0.5 tablets (2.5 mg total) by mouth daily.   CHLORTHALIDONE (HYGROTON) 25 MG TABLET chlorthalidone (HYGROTON) 25 MG tablet      TAKE ONE-HALF TABLET BY MOUTH ONCE DAILY    Take 0.5 tablets (12.5 mg total) by mouth daily.   NAPROXEN (NAPROSYN) 250 MG TABLET naproxen (NAPROSYN) 250 MG tablet      Take 1 tablet (250 mg total) by mouth 2 (two) times daily with a meal.    Take 1 tablet (250 mg total) by mouth 2 (two) times daily with a meal.   PREDNISONE (DELTASONE) 10 MG TABLET predniSONE (DELTASONE) 10 MG tablet      Take 0.5 tablets (5 mg total) by mouth daily.    Take 4 a day for 3 days, 3 a day for 3 days, 2 a day for 3 days, then 1 tab QD   SIMVASTATIN (ZOCOR) 20 MG TABLET simvastatin (ZOCOR) 20 MG tablet      TAKE ONE  TABLET BY MOUTH AT BEDTIME    Take 1 tablet (20 mg total) by mouth at bedtime.    Discontinued Medications   TAMSULOSIN (FLOMAX) 0.4 MG CAPS CAPSULE    Take 0.4 mg by mouth daily.     Gross side effects, risk and benefits, and alternatives of medications discussed with patient.  Patient is aware that all medications have potential side effects and we are unable to predict every sideeffect or drug-drug interaction that may occur.  Expresses verbal understanding and consents to current therapy plan and treatment regiment.  Return if symptoms worsen or fail to improve.  Please see AVS handed out to patient at the end of our visit for additional patient instructions/ counseling done pertaining to today's office visit.  Note: This document was prepared using Dragon voice recognition software and may include unintentional dictation errors.    Subjective:    Chief Complaint  Patient presents with  . URI    HPI:  Pt presents with URI sx for 6 days started with ST and sinus as well as chest congestion.    C/o rhinorrhea, No wheezing but 'rattles sometimes", and cough with yellow sputum production occ.     Denies objective  F/C, No face pain or ear pain, No N/V/D, No SOB/DIB more than usual, No Rash.     Mucinex taken anything for sx.    Overall unchanged.   Chronic COPD:  Taking prednisone- she recetnly cut dose in half and cut O2 down as well-  now 1 L - in half as well. Gained 5 lbs over holidays.  She is doing very well and very pleased.  Doesn't want to slip backwrds and wants to catch things early    Patient Active Problem List   Diagnosis Date Noted  . S/P aortic valve replacement with stentless valve 03/18/2011    Priority: High  . Carotid artery disease ([place])     Priority: High  . Hypertension     Priority: High  . COPD (chronic obstructive pulmonary disease) with emphysema ([place]) 03/02/2011    Priority: High  . Osteopenia 11/05/2015    Priority: Medium  . h/o  Intention tremor  11/05/2015    Priority: Medium  . Medication management 03/11/2016  . Dyspnea on exertion 03/11/2016  . Sleep disorder 12/11/2015  . Elevated hemoglobin A1c 11/13/2015  . Vitamin deficiency 11/13/2015  . 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 Allergic rhinitis 11/05/2015  . Hematuria 11/05/2015  . Hypothyroidism; on synthroid 11/05/2015  . h/o Hypercalcemia 11/05/2015  . h/o RLS (restless legs syndrome) 11/05/2015  . History of nephrolithiasis 11/05/2015  . S/P aortic valve replacement and aortoplasty 04/13/2011  . h/o Atrial fibrillation ([place]) 03/22/2011    Past medical history, Surgical history, Family history reviewed and noted below, Social history, Allergies, and Medications have been entered into the medical record, reviewed and changed as needed.   Allergies  Allergen Reactions  . Clarithromycin     Mouth/throat sore  . Tamsulosin Itching  . Penicillins Itching and Rash    Review of Systems  Constitutional: Positive for malaise/fatigue. Negative for chills and fever.  HENT: Positive for congestion. Negative for ear discharge, ear pain, sinus pain and sore throat.   Eyes: Negative for pain and discharge.  Respiratory: Positive for cough, sputum production and wheezing. Negative for shortness of breath and stridor.   Cardiovascular: Negative for chest pain and palpitations.  Gastrointestinal: Negative for constipation, diarrhea, nausea and vomiting.  Musculoskeletal: Negative for neck pain.  Skin: Negative for rash.  Neurological: Positive for headaches. Negative for dizziness.  Endo/Heme/Allergies: Positive for environmental allergies.  Psychiatric/Behavioral: The patient does not have insomnia.     Objective:   Blood pressure 109/61, pulse 75, temperature 98.1 F (36.7 C), temperature source Oral, weight 120 lb (54.4 kg), SpO2 94 %. Body mass index is 20.6 kg/m. General: Well Developed, well nourished, appropriate for stated age.  Neuro: Alert and  oriented x3, extra-ocular muscles intact, sensation grossly intact.  HEENT: Normocephalic, atraumatic, pupils equal round reactive to light, neck supple, no masses, no painful lymphadenopathy, TM's intact B/L, no acute findings. Nares- patent, clear d/c, OP- clear, mild erythema, No TTP sinuses Skin: Warm and dry, no gross rash. Cardiac: RRR, S1 S2,  no murmurs rubs or gallops.  Respiratory: ECTA B/L and A/P with coarse BS throughout, occ end-exp wh, Not using accessory muscles, speaking in full sentences- unlabored. Vascular:  No gross lower ext edema, cap RF less 2 sec. Psych: No HI/SI, judgement and insight good, Euthymic mood. Full Affect.   Patient Care Team    Relationship Specialty Notifications Start End  Mellody Dance, DO PCP - General Family Medicine  10/08/15   Rexene Alberts, MD  Cardiothoracic Surgery  03/05/11   Belva Crome, MD  Cardiology  03/05/11   Collene Gobble, MD Consulting Physician Pulmonary Disease  11/05/15   Magdalen Spatz, NP Nurse Practitioner Pulmonary Disease  11/05/15

## 2016-05-19 NOTE — Patient Instructions (Signed)

## 2016-06-01 ENCOUNTER — Other Ambulatory Visit: Payer: Self-pay | Admitting: Family Medicine

## 2016-06-08 ENCOUNTER — Other Ambulatory Visit: Payer: Self-pay | Admitting: Family Medicine

## 2016-06-10 ENCOUNTER — Ambulatory Visit (HOSPITAL_COMMUNITY)
Admission: EM | Admit: 2016-06-10 | Discharge: 2016-06-10 | Disposition: A | Discharge status: Home / Self Care | Payer: Medicare Other | Attending: Emergency Medicine | Admitting: Emergency Medicine

## 2016-06-10 ENCOUNTER — Encounter (HOSPITAL_COMMUNITY): Payer: Self-pay | Admitting: Family Medicine

## 2016-06-10 DIAGNOSIS — R0789 Other chest pain: Secondary | ICD-10-CM | POA: Diagnosis not present

## 2016-06-10 MED ORDER — NAPROXEN 250 MG PO TABS: 250.0000 mg | ORAL_TABLET | Freq: Two times a day (BID) | ORAL | 0 refills | Status: DC

## 2016-06-10 NOTE — ED Triage Notes (Signed)
Pt here for upper abd pain around her rib cage, raditing into back. The pain is worse with taking a deep breath and with lying flat. Denies any injury, chest pain, SOB, N,V,D, dizziness. Denies falling or heavy lifting. Denies coughing fever.

## 2016-06-10 NOTE — ED Provider Notes (Signed)
CSN: [id number]     Arrival date & time 06/10/16  1045 History   First MD Initiated Contact with Patient 06/10/16 1151     Chief Complaint  Patient presents with  . Abdominal Pain  . Back Pain   (Consider location/radiation/quality/duration/timing/severity/associated sxs/prior Treatment) C/o left intercostal muscle tenderness.   The history is provided by the patient.  Abdominal Pain  Pain location:  L flank Pain quality: aching   Pain radiates to:  Back Pain severity:  Moderate Onset quality:  Sudden Duration:  2 days Timing:  Constant Chronicity:  New Relieved by:  None tried Ineffective treatments:  None tried Back Pain  Associated symptoms: abdominal pain     Past Medical History:  Diagnosis Date  . Allergic rhinitis   . Aortic stenosis   . Carotid artery disease ([place])   . Colon polyps   . h/o Atrial fibrillation ([place]) 03/22/2011   Brief post op   . Hematuria 11/05/2015  . Hypercalcemia   . Hypertension   . Hypothyroid   . Osteopenia   . 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 Pressure urticaria   . RLS (restless legs syndrome)   . S/P aortic valve replacement and aortoplasty 04/13/2011   35m Medtronic Freestyle porcine aortic root   . S/P aortic valve replacement with stentless valve 03/18/2011   Past Surgical History:  Procedure Laterality Date  . AORTIC ROOT REPLACEMENT  03/18/2011   282mMedtronic Freestyle Porcine aortic root with reimplantation of coronary arteries  . BREAST LUMPECTOMY     Left  . CATARACT EXTRACTION    . CHOLECYSTECTOMY    . FOOT SURGERY     Morton's neuroma  . KNEE ARTHROSCOPY     Left   Family History  Problem Relation Age of Onset  . Cancer Mother     bladder  . Lung cancer Father   . Heart disease Maternal Grandmother   . Emphysema Maternal Aunt   . Asthma Maternal Aunt   . Asthma Maternal Uncle   . Breast cancer Sister   . Uterine cancer Sister    Social History  Substance Use Topics  . Smoking status: Former Smoker   Packs/day: 0.90    Years: 62.00    Types: Cigarettes    Quit date: 07/14/2010  . Smokeless tobacco: Never Used  . Alcohol use No   OB History    No data available     Review of Systems  Constitutional: Negative.   HENT: Negative.   Eyes: Negative.   Respiratory: Negative.   Cardiovascular: Negative.   Gastrointestinal: Positive for abdominal pain.  Endocrine: Negative.   Genitourinary: Negative.   Musculoskeletal: Positive for back pain.  Skin: Negative.   Allergic/Immunologic: Negative.   Neurological: Negative.   Hematological: Negative.   Psychiatric/Behavioral: Negative.     Allergies  Clarithromycin; Tamsulosin; and Penicillins  Home Medications   Prior to Admission medications   Medication Sig Start Date End Date Taking? Authorizing Provider  albuterol (PROVENTIL HFA;VENTOLIN HFA) 108 (90 Base) MCG/ACT inhaler Inhale 2 puffs into the lungs every 6 (six) hours as needed for wheezing or shortness of breath. 09/23/15   RoCollene GobbleMD  albuterol (PROVENTIL) (2.5 MG/3ML) 0.083% nebulizer solution Take 3 mLs (2.5 mg total) by nebulization every 4 (four) hours as needed for wheezing or shortness of breath. 09/11/15   RoCollene GobbleMD  amLODipine (NORVASC) 5 MG tablet Take 0.5 tablets (2.5 mg total) by mouth daily. 03/11/16   DeMellody DanceDO  amoxicillin-clavulanate (  AUGMENTIN) 875-125 MG tablet Take 1 tablet by mouth 2 (two) times daily. 05/19/16   Mellody Dance, DO  aspirin 81 MG tablet Take 81 mg by mouth daily.    Historical Provider, MD  chlorpheniramine-HYDROcodone (TUSSIONEX) 10-8 MG/5ML SUER Take 5 mLs by mouth every 12 (twelve) hours as needed for cough (cough, will cause drowsiness.). 05/19/16   Mellody Dance, DO  chlorthalidone (HYGROTON) 25 MG tablet TAKE ONE-HALF TABLET BY MOUTH ONCE DAILY 06/08/16   Mellody Dance, DO  DiphenhydrAMINE HCl (BENADRYL ALLERGY PO) Take 1 capsule by mouth at bedtime.    Historical Provider, MD  levothyroxine (SYNTHROID,  LEVOTHROID) 125 MCG tablet TAKE ONE TABLET BY MOUTH ONCE DAILY 05/12/16   Mellody Dance, DO  Melatonin 10 MG CAPS Take 10 mg by mouth at bedtime.    Historical Provider, MD  Multiple Vitamins-Minerals (MULTIVITAMIN WITH MINERALS) tablet Take 1 tablet by mouth daily.    Historical Provider, MD  naproxen (NAPROSYN) 250 MG tablet Take 1 tablet (250 mg total) by mouth 2 (two) times daily with a meal. 06/10/16   Lysbeth Penner, FNP  predniSONE (DELTASONE) 10 MG tablet Take 5 mg by mouth daily with breakfast.    Historical Provider, MD  simvastatin (ZOCOR) 20 MG tablet TAKE ONE TABLET BY MOUTH AT BEDTIME 06/01/16   Deborah Opalski, DO  STIOLTO RESPIMAT 2.5-2.5 MCG/ACT AERS INHALE TWO PUFFS INTO THE LUNGS ONCE DAILY 01/08/16   Collene Gobble, MD  tamsulosin (FLOMAX) 0.4 MG CAPS capsule Take 0.4 mg by mouth daily. 02/13/16   Historical Provider, MD   Meds Ordered and Administered this Visit  Medications - No data to display  BP 147/70   Pulse 85   Temp 97.9 F (36.6 C)   Resp 20   SpO2 98%  No data found.   Physical Exam  Constitutional: She appears well-developed and well-nourished.  HENT:  Head: Normocephalic and atraumatic.  Right Ear: External ear normal.  Left Ear: External ear normal.  Mouth/Throat: Oropharynx is clear and moist.  Eyes: Conjunctivae and EOM are normal. Pupils are equal, round, and reactive to light.  Neck: Normal range of motion. Neck supple.  Cardiovascular: Normal rate, regular rhythm and normal heart sounds.   Pulmonary/Chest: Effort normal and breath sounds normal.  Abdominal: Soft. Bowel sounds are normal.  Musculoskeletal: She exhibits tenderness.  Left lateral chest wall and intercostals tender.  No crepitus, stepping off, or paradoxidal chest wall movement appreciated.  Nursing note and vitals reviewed.   Urgent Care Course     Procedures (including critical care time)  Labs Review Labs Reviewed - No data to display  Imaging Review No results  found.   Visual Acuity Review  Right Eye Distance:   Left Eye Distance:   Bilateral Distance:    Right Eye Near:   Left Eye Near:    Bilateral Near:         MDM   1. Acute chest wall pain    Naprosy 265m one po bid x 7 days May Take tylenol otc as directed.  Follow up with PCP.     WLysbeth Penner FNP 06/10/16 1154

## 2016-06-15 ENCOUNTER — Other Ambulatory Visit: Payer: Self-pay

## 2016-06-15 MED ORDER — TRAMADOL HCL 50 MG PO TABS: 50.0000 mg | ORAL_TABLET | Freq: Four times a day (QID) | ORAL | 0 refills | Status: DC | PRN

## 2016-06-15 NOTE — Progress Notes (Signed)
Pt c/o persistent pain between shoulder blades.  Pt states that naproxen is ot helping with the pain.  Per Lillard Anes, RX for tramadol and pt to f/u in 3 days if no improvement for possible xray.  Pt informed.  Pt expressed understanding and is agreeable.  Charyl Bigger, CMA

## 2016-06-18 ENCOUNTER — Encounter: Payer: Self-pay | Admitting: Emergency Medicine

## 2016-06-18 ENCOUNTER — Ambulatory Visit (INDEPENDENT_AMBULATORY_CARE_PROVIDER_SITE_OTHER): Payer: Medicare Other | Admitting: Emergency Medicine

## 2016-06-18 DIAGNOSIS — J438 Other emphysema: Secondary | ICD-10-CM

## 2016-06-18 DIAGNOSIS — J439 Emphysema, unspecified: Secondary | ICD-10-CM | POA: Diagnosis not present

## 2016-06-18 DIAGNOSIS — J449 Chronic obstructive pulmonary disease, unspecified: Secondary | ICD-10-CM | POA: Diagnosis not present

## 2016-06-18 LAB — PULMONARY FUNCTION TEST
DL/VA % PRED: 71 %
DL/VA: 3.36 ml/min/mmHg/L
DLCO COR % PRED: 46 %
DLCO COR: 10.84 ml/min/mmHg
DLCO UNC % PRED: 47 %
DLCO unc: 11.12 ml/min/mmHg
FEF 25-75 POST: 0.66 L/s
FEF 25-75 PRE: 0.26 L/s
FEF2575-%CHANGE-POST: 151 %
FEF2575-%PRED-POST: 57 %
FEF2575-%PRED-PRE: 22 %
FEV1-%Change-Post: 28 %
FEV1-%PRED-PRE: 42 %
FEV1-%Pred-Post: 54 %
FEV1-Post: 0.93 L
FEV1-Pre: 0.73 L
FEV1FVC-%CHANGE-POST: 0 %
FEV1FVC-%PRED-PRE: 72 %
FEV6-%CHANGE-POST: 33 %
FEV6-%Pred-Post: 77 %
FEV6-%Pred-Pre: 58 %
FEV6-Post: 1.71 L
FEV6-Pre: 1.28 L
FEV6FVC-%Change-Post: 0 %
FEV6FVC-%Pred-Post: 103 %
FEV6FVC-%Pred-Pre: 102 %
FVC-%Change-Post: 29 %
FVC-%Pred-Post: 75 %
FVC-%Pred-Pre: 58 %
FVC-PRE: 1.36 L
FVC-Post: 1.76 L
POST FEV1/FVC RATIO: 53 %
POST FEV6/FVC RATIO: 97 %
PRE FEV1/FVC RATIO: 53 %
Pre FEV6/FVC Ratio: 97 %
RV % pred: 135 %
RV: 3.3 L
TLC % pred: 101 %
TLC: 5 L

## 2016-06-18 NOTE — Progress Notes (Signed)
Subjective:    Patient ID: [name], female    DOB: 29-Jun-1929, 83 y.o.   MRN: [id number]  HPI 83 year old woman, former smoker (50  Pack years), followed by Dr. Gwenette Greet in the past for COPD. Also with a history of hypertension, aortic valve bioprosthesis. She has been managed on Spiriva, was changed to [place] over a year to see if she would benefit more. She reports exertional SOB, difficulty getting a deep breath walking through the house or on an incline. She has to rest when doing vacuuming. She feels the breathing has been slowly worse.  She has uses albuterol at these times with some improvement.  She was started on propranolol recently, ? Whether this could be a factor.    ROV 07/22/15 -- follow-up visit for COPD, also with a history of aortic valve replacement and hypertension. She was treated with abx for a bronchitis in January. Was well until about 8 days ago. She developed more cough, prod of clear thick. More dyspnea and wheeze. No known sick contacts. Fever once. No aches, but is fatigued.  She is on stiolto, does not have a SABA  ROV 09/19/15 -- Follow-up visit for COPD with frequent exacerbations. She was seen at the end of March in our office with continued dyspnea and found to have exertional hypoxemia. She was started on oxygen at that time. She is currently managed on Stiolto and albuterol as needed. She was treated with pred end of March. She is wearing O2 reliably at 3L/min, has had some desaturations. Remains dyspneic, has had some wheeze. Minimal cough. No mucous. No resting dyspnea. Uses albuterol several times a day.   ROV 06/18/16 -- [name] has a history of severe COPD with frequent exacerbations, hypoxemic respiratory failure since May 2017. She had an acute exacerbation in June 2017. She has been managed on stiolto, is on pred 69m daily since about 3 weeks. She uses albuterol much less now that the chronic pred has been stopped. She underwent pulmonary function testing  today. Spirometry reveals very severe obstruction, FEV1 0.73 L (42% predicted) w a positive bronchodilator response, borderline hyperinflation on lung volumes, and a decreased diffusion capacity.    Review of Systems  Constitutional: Positive for activity change. Negative for fever and unexpected weight change.  HENT: Negative for congestion, postnasal drip, rhinorrhea and sneezing.   Respiratory: Positive for shortness of breath. Negative for apnea, cough, chest tightness, wheezing and stridor.    As per HPI  Past Medical History:  Diagnosis Date  . Allergic rhinitis   . Aortic stenosis   . Carotid artery disease ([place]   . Colon polyps   . h/o Atrial fibrillation ([place] 03/22/2011   Brief post op   . Hematuria 11/05/2015  . Hypercalcemia   . Hypertension   . Hypothyroid   . Osteopenia   . 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 Pressure urticaria   . RLS (restless legs syndrome)   . S/P aortic valve replacement and aortoplasty 04/13/2011   284mMedtronic Freestyle porcine aortic root   . S/P aortic valve replacement with stentless valve 03/18/2011     Family History  Problem Relation Age of Onset  . Cancer Mother     bladder  . Lung cancer Father   . Heart disease Maternal Grandmother   . Emphysema Maternal Aunt   . Asthma Maternal Aunt   . Asthma Maternal Uncle   . Breast cancer Sister   . Uterine cancer Sister  Social History   Social History  . Marital status: Widowed    Spouse name: N/A  . Number of children: N  . Years of education: N/A   Occupational History  . housewife    Social History Main Topics  . Smoking status: Former Smoker    Packs/day: 0.90    Years: 62.00    Types: Cigarettes    Quit date: 07/14/2010  . Smokeless tobacco: Never Used  . Alcohol use No  . Drug use: No  . Sexual activity: No   Other Topics Concern  . Not on file   Social History Narrative  . No narrative on file     Allergies  Allergen Reactions  . Clarithromycin      Mouth/throat sore  . Tamsulosin Itching  . Penicillins Itching and Rash     Outpatient Medications Prior to Visit  Medication Sig Dispense Refill  . albuterol (PROVENTIL HFA;VENTOLIN HFA) 108 (90 Base) MCG/ACT inhaler Inhale 2 puffs into the lungs every 6 (six) hours as needed for wheezing or shortness of breath. 1 Inhaler 2  . albuterol (PROVENTIL) (2.5 MG/3ML) 0.083% nebulizer solution Take 3 mLs (2.5 mg total) by nebulization every 4 (four) hours as needed for wheezing or shortness of breath. 300 mL 5  . amLODipine (NORVASC) 5 MG tablet Take 0.5 tablets (2.5 mg total) by mouth daily. 45 tablet 0  . aspirin 81 MG tablet Take 81 mg by mouth daily.    . chlorpheniramine-HYDROcodone (TUSSIONEX) 10-8 MG/5ML SUER Take 5 mLs by mouth every 12 (twelve) hours as needed for cough (cough, will cause drowsiness.). 120 mL 0  . chlorthalidone (HYGROTON) 25 MG tablet TAKE ONE-HALF TABLET BY MOUTH ONCE DAILY 45 tablet 0  . DiphenhydrAMINE HCl (BENADRYL ALLERGY PO) Take 1 capsule by mouth at bedtime.    Marland Kitchen levothyroxine (SYNTHROID, LEVOTHROID) 125 MCG tablet TAKE ONE TABLET BY MOUTH ONCE DAILY 90 tablet 1  . Melatonin 10 MG CAPS Take 10 mg by mouth at bedtime.    . Multiple Vitamins-Minerals (MULTIVITAMIN WITH MINERALS) tablet Take 1 tablet by mouth daily.    . predniSONE (DELTASONE) 10 MG tablet Take 5 mg by mouth daily with breakfast.    . simvastatin (ZOCOR) 20 MG tablet TAKE ONE TABLET BY MOUTH AT BEDTIME 90 tablet 0  . STIOLTO RESPIMAT 2.5-2.5 MCG/ACT AERS INHALE TWO PUFFS INTO THE LUNGS ONCE DAILY 1 Inhaler 5  . traMADol (ULTRAM) 50 MG tablet Take 1 tablet (50 mg total) by mouth every 6 (six) hours as needed. 45 tablet 0  . amoxicillin-clavulanate (AUGMENTIN) 875-125 MG tablet Take 1 tablet by mouth 2 (two) times daily. (Patient not taking: Reported on 06/18/2016) 20 tablet 0  . naproxen (NAPROSYN) 250 MG tablet Take 1 tablet (250 mg total) by mouth 2 (two) times daily with a meal. (Patient not taking:  Reported on 06/18/2016) 14 tablet 0   No facility-administered medications prior to visit.           Objective:   Physical Exam Vitals:   06/18/16 1220  BP: 112/62  Pulse: 70  SpO2: 94%   Gen: Pleasant, thin woman,  in no distress,  normal affect  ENT: No lesions,  mouth clear,  oropharynx clear, no postnasal drip  Neck: No JVD, no TMG, no carotid bruits  Lungs: No use of accessory muscles, distant, no wheezes.   Cardiovascular: RRR, heart sounds normal, no murmur or gallops, no peripheral edema  Musculoskeletal: No deformities, no cyanosis or clubbing  Neuro: alert,  non focal  Skin: Warm, no lesions or rashes      Assessment & Plan:  COPD (chronic obstructive pulmonary disease) with emphysema ([place]) She had been quite symptomatic even on good bronchodilator therapy. This prompted her primary care physician to start scheduled prednisone. She appears to significant sleep benefited. She is using 5 mg daily and I think that this is a good maintenance dose for her. We will continue her current regimen.. As above her pulmonary function testing was consistent with severe obstruction.   Please continue your Stiolto 2 puffs every day.  Keep your albuterol nebulizer available to use up to every 4 hours if needed for shortness of breath.  We will continue Prednisone 58m daily for now.  Continue your oxygen at 1 - 2 L/min at all times.  Flu shot and pneumonia shot are up to date.  Follow with Dr BLamonte Sakaiin 4 months or sooner if you have any problems.   RBaltazar Apo MD, PhD 06/18/2016, 12:36 PM [facility] [phone] if no answer [phone]

## 2016-06-18 NOTE — Assessment & Plan Note (Signed)
She had been quite symptomatic even on good bronchodilator therapy. This prompted her primary care physician to start scheduled prednisone. She appears to significant sleep benefited. She is using 5 mg daily and I think that this is a good maintenance dose for her. We will continue her current regimen.. As above her pulmonary function testing was consistent with severe obstruction.   Please continue your Stiolto 2 puffs every day.  Keep your albuterol nebulizer available to use up to every 4 hours if needed for shortness of breath.  We will continue Prednisone 47m daily for now.  Continue your oxygen at 1 - 2 L/min at all times.  Flu shot and pneumonia shot are up to date.  Follow with Dr BLamonte Sakaiin 4 months or sooner if you have any problems.

## 2016-06-18 NOTE — Patient Instructions (Addendum)
Please continue your Stiolto 2 puffs every day.  Keep your albuterol nebulizer available to use up to every 4 hours if needed for shortness of breath.  We will continue Prednisone 81m daily for now.  Continue your oxygen at 1 - 2 L/min at all times.  Flu shot and pneumonia shot are up to date.  Follow with Dr BLamonte Sakaiin 4 months or sooner if you have any problems.

## 2016-06-23 ENCOUNTER — Other Ambulatory Visit: Payer: Self-pay | Admitting: Family Medicine

## 2016-06-26 DIAGNOSIS — Z1231 Encounter for screening mammogram for malignant neoplasm of breast: Secondary | ICD-10-CM | POA: Diagnosis not present

## 2016-06-26 DIAGNOSIS — Z803 Family history of malignant neoplasm of breast: Secondary | ICD-10-CM | POA: Diagnosis not present

## 2016-06-26 LAB — HM MAMMOGRAPHY

## 2016-07-15 ENCOUNTER — Other Ambulatory Visit: Payer: Self-pay | Admitting: Family Medicine

## 2016-07-16 DIAGNOSIS — J449 Chronic obstructive pulmonary disease, unspecified: Secondary | ICD-10-CM | POA: Diagnosis not present

## 2016-08-16 DIAGNOSIS — J449 Chronic obstructive pulmonary disease, unspecified: Secondary | ICD-10-CM | POA: Diagnosis not present

## 2016-09-01 ENCOUNTER — Other Ambulatory Visit: Payer: Self-pay | Admitting: Family Medicine

## 2016-09-06 ENCOUNTER — Other Ambulatory Visit: Payer: Self-pay | Admitting: Family Medicine

## 2016-09-08 ENCOUNTER — Other Ambulatory Visit: Payer: Self-pay | Admitting: Emergency Medicine

## 2016-09-15 DIAGNOSIS — J449 Chronic obstructive pulmonary disease, unspecified: Secondary | ICD-10-CM | POA: Diagnosis not present

## 2016-09-23 ENCOUNTER — Other Ambulatory Visit: Payer: Self-pay | Admitting: Family Medicine

## 2016-10-16 DIAGNOSIS — J449 Chronic obstructive pulmonary disease, unspecified: Secondary | ICD-10-CM | POA: Diagnosis not present

## 2016-10-19 ENCOUNTER — Ambulatory Visit (INDEPENDENT_AMBULATORY_CARE_PROVIDER_SITE_OTHER): Payer: Medicare Other | Admitting: Emergency Medicine

## 2016-10-19 ENCOUNTER — Encounter: Payer: Self-pay | Admitting: Emergency Medicine

## 2016-10-19 DIAGNOSIS — J439 Emphysema, unspecified: Secondary | ICD-10-CM

## 2016-10-19 MED ORDER — ALBUTEROL SULFATE HFA 108 (90 BASE) MCG/ACT IN AERS: 2.0000 | INHALATION_SPRAY | Freq: Four times a day (QID) | RESPIRATORY_TRACT | 2 refills | Status: DC | PRN

## 2016-10-19 MED ORDER — ALBUTEROL SULFATE (2.5 MG/3ML) 0.083% IN NEBU: 2.5000 mg | INHALATION_SOLUTION | RESPIRATORY_TRACT | 5 refills | Status: DC | PRN

## 2016-10-19 NOTE — Assessment & Plan Note (Signed)
Please continue your oxygen at all times Please continue Stiolto once a day Keep albuterol available to use [place] as needed. We will refill your Proventil today Continue prednisone 5 mg daily Follow with Dr Lamonte Sakai in 6 months or sooner if you have any problems

## 2016-10-19 NOTE — Progress Notes (Signed)
Subjective:    Patient ID: [name], female    DOB: 1929-11-10, 83 y.o.   MRN: [id number]  HPI 83 year old woman, former smoker (50  Pack years), followed by Dr. Gwenette Greet in the past for COPD. Also with a history of hypertension, aortic valve bioprosthesis.   ROV 06/18/16 -- Mrs. Mcgahan has a history of severe COPD with frequent exacerbations, hypoxemic respiratory failure since May 2017. She had an acute exacerbation in June 2017. She has been managed on stiolto, is on pred 22m daily since about 3 weeks. She uses albuterol much less now that the chronic pred has been stopped. She underwent pulmonary function testing today. Spirometry reveals very severe obstruction, FEV1 0.73 L (42% predicted) w a positive bronchodilator response, borderline hyperinflation on lung volumes, and a decreased diffusion capacity.   ROV 10/19/16 -- This follow-up visit for severe COPD and hypoxemic respiratory failure. Chronic prednisone 5 mg daily. She reports that she is doing well. She does still get dyspnea with exertion. She is active and is able to do yard work. She does house work and shopping. She remains on Stiolto. She rarely needs albuterol. She has not had any flares. Minimal cough. No wheeze. She paces herself. Wears her o2 reliably.     Review of Systems  Constitutional: Negative for activity change, fever and unexpected weight change.  HENT: Negative for congestion, postnasal drip, rhinorrhea and sneezing.   Respiratory: Negative for apnea, cough, chest tightness, shortness of breath, wheezing and stridor.    As per HPI      Objective:   Physical Exam Vitals:   10/19/16 1223  BP: 110/60  Pulse: 70  SpO2: 92%  Weight: 121 lb (54.9 kg)  Height: 5' 4" (1.626 m)   Gen: Pleasant, thin woman,  in no distress,  normal affect  ENT: No lesions,  mouth clear,  oropharynx clear, no postnasal drip  Neck: No JVD, no stridor  Lungs: No use of accessory muscles, distant, no wheezes.    Cardiovascular: RRR, heart sounds normal, no murmur or gallops, no peripheral edema  Musculoskeletal: No deformities, no cyanosis or clubbing  Neuro: alert, non focal  Skin: Warm, no lesions or rashes      Assessment & Plan:  COPD (chronic obstructive pulmonary disease) with [place]ysema ([place] Please continue your oxygen at all times Please continue Stiolto once a day Keep albuterol available to use u[place] needed. We will refill your Proventil today Continue prednisone 5 mg daily Follow with Dr BLamonte Sakaiin 6 months or sooner if you have any problems  RBaltazar Apo MD, PhD 10/19/2016, 12:38 PM [facility] [phone] if no answer [phone]

## 2016-10-19 NOTE — Addendum Note (Signed)
Addended by: Clayborne Dana C on: 10/19/2016 12:49 PM   Modules accepted: Orders

## 2016-10-19 NOTE — Patient Instructions (Signed)
Please continue your oxygen at all times Please continue Stiolto once a day Keep albuterol available to use [place] as needed. We will refill your Proventil today Continue prednisone 5 mg daily Follow with Dr Lamonte Sakai in 6 months or sooner if you have any problems

## 2016-10-21 ENCOUNTER — Other Ambulatory Visit: Payer: Self-pay

## 2016-10-21 MED ORDER — ALBUTEROL SULFATE HFA 108 (90 BASE) MCG/ACT IN AERS: 2.0000 | INHALATION_SPRAY | Freq: Four times a day (QID) | RESPIRATORY_TRACT | 3 refills | Status: DC | PRN

## 2016-11-03 ENCOUNTER — Ambulatory Visit (INDEPENDENT_AMBULATORY_CARE_PROVIDER_SITE_OTHER): Payer: Medicare Other | Admitting: Family Medicine

## 2016-11-03 ENCOUNTER — Encounter: Payer: Self-pay | Admitting: Family Medicine

## 2016-11-03 VITALS — BP 91/49 | HR 75 | Ht 64.0 in | Wt 123.2 lb

## 2016-11-03 DIAGNOSIS — Z79899 Other long term (current) drug therapy: Secondary | ICD-10-CM

## 2016-11-03 DIAGNOSIS — R0609 Other forms of dyspnea: Secondary | ICD-10-CM

## 2016-11-03 DIAGNOSIS — J438 Other emphysema: Secondary | ICD-10-CM

## 2016-11-03 DIAGNOSIS — I10 Essential (primary) hypertension: Secondary | ICD-10-CM

## 2016-11-03 DIAGNOSIS — R7309 Other abnormal glucose: Secondary | ICD-10-CM | POA: Diagnosis not present

## 2016-11-03 DIAGNOSIS — Z9981 Dependence on supplemental oxygen: Secondary | ICD-10-CM

## 2016-11-03 LAB — POCT GLYCOSYLATED HEMOGLOBIN (HGB A1C): HEMOGLOBIN A1C: 5.7

## 2016-11-03 MED ORDER — AMLODIPINE BESYLATE 2.5 MG PO TABS: 2.5000 mg | ORAL_TABLET | Freq: Every day | ORAL | 1 refills | Status: DC

## 2016-11-03 MED ORDER — CHLORTHALIDONE 25 MG PO TABS: 12.5000 mg | ORAL_TABLET | ORAL | 0 refills | Status: DC

## 2016-11-03 MED ORDER — PREDNISONE 5 MG PO TABS: 5.0000 mg | ORAL_TABLET | Freq: Every day | ORAL | 1 refills | Status: DC

## 2016-11-03 NOTE — Progress Notes (Signed)
Impression and Recommendations:    1. Essential hypertension   2. Elevated hemoglobin A1c   3. Other emphysema ([place])   4. Dyspnea on exertion   5. Medication management   6. Oxygen dependent       Elevated hemoglobin A1c 5.7  7 mo ago, but 12 mo ago A1c was 6.0 and up.  --> check today   The patient was counseled, risk factors were discussed, anticipatory guidance given.   New Prescriptions   AMLODIPINE (NORVASC) 2.5 MG TABLET    Take 1 tablet (2.5 mg total) by mouth daily.   PREDNISONE (DELTASONE) 5 MG TABLET    Take 1 tablet (5 mg total) by mouth daily with breakfast.     Discontinued Medications   AMLODIPINE (NORVASC) 5 MG TABLET    TAKE ONE-HALF TABLET BY MOUTH ONCE DAILY   CHLORPHENIRAMINE-HYDROCODONE (TUSSIONEX) 10-8 MG/5ML SUER    Take 5 mLs by mouth every 12 (twelve) hours as needed for cough (cough, will cause drowsiness.).   PREDNISONE (DELTASONE) 10 MG TABLET    Take 0.5 tablets (5 mg total) by mouth daily.      No orders of the defined types were placed in this encounter.    Gross side effects, risk and benefits, and alternatives of medications and treatment plan in general discussed with patient.  Patient is aware that all medications have potential side effects and we are unable to predict every side effect or drug-drug interaction that may occur.   Patient will call with any questions prior to using medication if they have concerns.  Expresses verbal understanding and consents to current therapy and treatment regimen.  No barriers to understanding were identified.  Red flag symptoms and signs discussed in detail.  Patient expressed understanding regarding what to do in case of emergency\urgent symptoms  Please see AVS handed out to patient at the end of our visit for further patient instructions/ counseling done pertaining to today's office visit.   Return for 3-4 mo- Oct for OV and fasting labs.     Note: This document was prepared using  Dragon voice recognition software and may include unintentional dictation errors.  Mellody Dance 2:58 PM --------------------------------------------------------------------------------------------------------------------------------------------------------------------------------------------------------------------------------------------    Subjective:    CC:  Chief Complaint  Patient presents with  . Hypertension  . COPD    HPI: [name] is a 83 y.o. female who presents to [facility] today for issues as discussed below.   Lower back pain- L lower back- started 1 week ago.  Has h/o back pain with L sciatica - known h/o in 2016- had xray showed  Degenerative disc disease at L5-S1 and a partial compresion deformity of the L3 superior endplate.  7/10 worst; 1/10 at best- feels little soreness in L hip.   Pt was trying to start mower and felt bad pop in back--> that spresumably when compression fx at L3 occurred.   At home O2 at 2.5L/hr and portable one- 1L /hr  RIding mows 3 yards once wkly.   Tends to plants in yrad- watering them, does housework, laundry  BP- no dizziness, no   Problem  Elevated Hemoglobin A1c     Wt Readings from Last 3 Encounters:  11/03/16 123 lb 3.2 oz (55.9 kg)  10/19/16 121 lb (54.9 kg)  05/19/16 120 lb (54.4 kg)   BP Readings from Last 3 Encounters:  11/03/16 (!) 91/49  10/19/16 110/60  06/18/16 112/62   Pulse Readings  from Last 3 Encounters:  11/03/16 75  10/19/16 70  06/18/16 70   BMI Readings from Last 3 Encounters:  11/03/16 21.15 kg/m  10/19/16 20.77 kg/m  05/19/16 20.60 kg/m     Patient Care Team    Relationship Specialty Notifications Start End  Mellody Dance, DO PCP - General Family Medicine  10/08/15   Rexene Alberts, MD  Cardiothoracic Surgery  03/05/11   Belva Crome, MD  Cardiology  03/05/11   Collene Gobble, MD Consulting Physician Pulmonary Disease  11/05/15   Magdalen Spatz, NP Nurse Practitioner Pulmonary Disease  11/05/15      Patient Active Problem List   Diagnosis Date Noted  . S/P aortic valve replacement with stentless valve 03/18/2011    Priority: High  . Carotid artery disease ([place])     Priority: High  . Hypertension     Priority: High  . COPD (chronic obstructive pulmonary disease) with emphysema ([place]) 03/02/2011    Priority: High  . Osteopenia 11/05/2015    Priority: Medium  . h/o Intention tremor  11/05/2015    Priority: Medium  . Medication management 03/11/2016  . Dyspnea on exertion 03/11/2016  . Sleep disorder 12/11/2015  . Elevated hemoglobin A1c 11/13/2015  . Vitamin deficiency 11/13/2015  . 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 Allergic rhinitis 11/05/2015  . Hematuria 11/05/2015  . Hypothyroidism; on synthroid 11/05/2015  . h/o Hypercalcemia 11/05/2015  . h/o RLS (restless legs syndrome) 11/05/2015  . History of nephrolithiasis 11/05/2015  . S/P aortic valve replacement and aortoplasty 04/13/2011  . h/o Atrial fibrillation ([place]) 03/22/2011    Past Medical history, Surgical history, Family history, Social history, Allergies and Medications have been entered into the medical record, reviewed and changed as needed.    Current Meds  Medication Sig  . albuterol (PROAIR HFA) 108 (90 Base) MCG/ACT inhaler Inhale 2 puffs into the lungs every 6 (six) hours as needed for wheezing or shortness of breath.  Marland Kitchen albuterol (PROVENTIL) (2.5 MG/3ML) 0.083% nebulizer solution Take 3 mLs (2.5 mg total) by nebulization every 4 (four) hours as needed for wheezing or shortness of breath.  Marland Kitchen aspirin 81 MG tablet Take 81 mg by mouth daily.  . chlorthalidone (HYGROTON) 25 MG tablet Take 0.5 tablets (12.5 mg total) by mouth every other day.  . DiphenhydrAMINE HCl (BENADRYL ALLERGY PO) Take 1 capsule by mouth at bedtime.  Marland Kitchen levothyroxine (SYNTHROID, LEVOTHROID) 125 MCG tablet TAKE ONE TABLET BY MOUTH ONCE DAILY  . Melatonin 10 MG CAPS Take 10 mg by mouth at  bedtime.  . Multiple Vitamins-Minerals (MULTIVITAMIN WITH MINERALS) tablet Take 1 tablet by mouth daily.  . simvastatin (ZOCOR) 20 MG tablet TAKE ONE TABLET BY MOUTH AT BEDTIME  . STIOLTO RESPIMAT 2.5-2.5 MCG/ACT AERS INHALE 2 PUFFS INTO THE LUNGS DAILY  . traMADol (ULTRAM) 50 MG tablet Take 1 tablet (50 mg total) by mouth every 6 (six) hours as needed.  . [DISCONTINUED] amLODipine (NORVASC) 5 MG tablet TAKE ONE-HALF TABLET BY MOUTH ONCE DAILY  . [DISCONTINUED] chlorthalidone (HYGROTON) 25 MG tablet TAKE ONE-HALF TABLET BY MOUTH ONCE DAILY  . [DISCONTINUED] predniSONE (DELTASONE) 10 MG tablet Take 0.5 tablets (5 mg total) by mouth daily.    Allergies:  Allergies  Allergen Reactions  . Clarithromycin     Mouth/throat sore  . Tamsulosin Itching  . Penicillins Itching and Rash     Review of Systems: General:   Denies fever, chills, unexplained weight loss.  Optho/Auditory:   Denies visual changes,  blurred vision/LOV Respiratory:   Denies wheeze, DOE more than baseline levels.  Cardiovascular:   Denies chest pain, palpitations, new onset peripheral edema  Gastrointestinal:   Denies nausea, vomiting, diarrhea, abd pain.  Genitourinary: Denies dysuria, freq/ urgency, flank pain or discharge from genitals.  Endocrine:     Denies hot or cold intolerance, polyuria, polydipsia. Musculoskeletal:   Denies unexplained myalgias, joint swelling, unexplained arthralgias, gait problems.  Skin:  Denies new onset rash, suspicious lesions Neurological:     Denies dizziness, unexplained weakness, numbness  Psychiatric/Behavioral:   Denies mood changes, suicidal or homicidal ideations, hallucinations    Objective:   Blood pressure (!) 91/49, pulse 75, height _0  (1.626 m), weight 123 lb 3.2 oz (55.9 kg). Body mass index is 21.15 kg/m. General:  Well Developed, well nourished, appropriate for stated age.  Neuro:  Alert and oriented,  extra-ocular muscles intact  HEENT:  Normocephalic,  atraumatic, neck supple, no carotid bruits appreciated  Skin:  no gross rash, warm, pink. Cardiac:  RRR, S1 S2 Respiratory:  ECTA B/L and A/P, Not using accessory muscles, speaking in full sentences- unlabored. Vascular:  Ext warm, no cyanosis apprec.; cap RF less 2 sec. Psych:  No HI/SI, judgement and insight good, Euthymic mood. Full Affect.

## 2016-11-03 NOTE — Assessment & Plan Note (Signed)
5.7  7 mo ago, but 12 mo ago A1c was 6.0 and up.  --> check today

## 2016-11-03 NOTE — Patient Instructions (Addendum)
Since the chlorthalidone BP med has such a long half life of 40hrs, please take your 1/2 tab every other day.  Please monitor your BP-   have the pharmacist at [facility] check it every couple days.  Write it down and call us with results after a couple wks.      Call for f/up appt with Cardiologist- Dr Tamala Julian - for Oct, 2018.  Tylenol- as needed back pain and if pain persists beyond 2 wks or  More or worsens- let me know.  If you would like, may take prednisone 73m daily for just 5 days for the pinched L5-S1 nerve/ sciatica symptoms  - Once a year- will get bone density testing since on chronic steroids  ALos- A1c was 5.5 today- fantastic.

## 2016-11-10 ENCOUNTER — Other Ambulatory Visit: Payer: Self-pay | Admitting: Family Medicine

## 2016-11-11 ENCOUNTER — Other Ambulatory Visit: Payer: Self-pay | Admitting: Family Medicine

## 2016-11-11 NOTE — Telephone Encounter (Signed)
Felicia from [facility] left VM stating they have a new thyroid med supplier and need verbal confirmation that she switch is ok for patient next refill. Phone number is [phone]

## 2016-11-11 NOTE — Telephone Encounter (Signed)
[facility] called to get authorization to change levothyroxine manufacturer.  Verbal given.

## 2016-11-15 DIAGNOSIS — J449 Chronic obstructive pulmonary disease, unspecified: Secondary | ICD-10-CM | POA: Diagnosis not present

## 2016-12-02 ENCOUNTER — Encounter: Payer: Self-pay | Admitting: Family Medicine

## 2016-12-07 ENCOUNTER — Other Ambulatory Visit: Payer: Self-pay | Admitting: Adult Health

## 2016-12-15 ENCOUNTER — Encounter: Payer: Self-pay | Admitting: Family Medicine

## 2016-12-16 DIAGNOSIS — J449 Chronic obstructive pulmonary disease, unspecified: Secondary | ICD-10-CM | POA: Diagnosis not present

## 2016-12-28 ENCOUNTER — Telehealth: Payer: Self-pay

## 2016-12-28 NOTE — Telephone Encounter (Signed)
Pt's recent home blood pressure readings:  114/55    115/65   116/65    113/58   115/56  Please advise if any medication changes are needed.  Pt has appt 01/20/17.  Charyl Bigger, CMA

## 2016-12-30 NOTE — Telephone Encounter (Signed)
Is pt symptomatic? Dizzy or sx of orthostatic hypotnsn?

## 2016-12-30 NOTE — Telephone Encounter (Signed)
Please confirm that patient is taking proper doses of medicines ( that med list is up-to-date and actually what the patient is taking)  and that she did change her blood pressure medicine dose since last seen.

## 2016-12-31 ENCOUNTER — Other Ambulatory Visit: Payer: Self-pay

## 2016-12-31 NOTE — Telephone Encounter (Signed)
Thanks for letting me mknow

## 2016-12-31 NOTE — Telephone Encounter (Signed)
Pt denies any dizziness or orthostatic problems.  Pt has been taking 2.71m amlodipine despite instructions given on 12/16/16 to only take 1/2 tablet daily.  Advised pt to only take a half tablet.  Pt has CPE schedule 01/13/17.  TCharyl Bigger CMA

## 2017-01-13 ENCOUNTER — Other Ambulatory Visit: Payer: Medicare Other

## 2017-01-13 DIAGNOSIS — E7849 Other hyperlipidemia: Secondary | ICD-10-CM

## 2017-01-13 DIAGNOSIS — R7309 Other abnormal glucose: Secondary | ICD-10-CM

## 2017-01-13 DIAGNOSIS — E784 Other hyperlipidemia: Secondary | ICD-10-CM | POA: Diagnosis not present

## 2017-01-13 DIAGNOSIS — E569 Vitamin deficiency, unspecified: Secondary | ICD-10-CM

## 2017-01-13 DIAGNOSIS — E038 Other specified hypothyroidism: Secondary | ICD-10-CM

## 2017-01-13 DIAGNOSIS — Z862 Personal history of diseases of the blood and blood-forming organs and certain disorders involving the immune mechanism: Secondary | ICD-10-CM | POA: Diagnosis not present

## 2017-01-13 DIAGNOSIS — M8580 Other specified disorders of bone density and structure, unspecified site: Secondary | ICD-10-CM

## 2017-01-13 DIAGNOSIS — I10 Essential (primary) hypertension: Secondary | ICD-10-CM | POA: Diagnosis not present

## 2017-01-14 LAB — COMPREHENSIVE METABOLIC PANEL
A/G RATIO: 1.5 (ref 1.2–2.2)
ALT: 21 IU/L (ref 0–32)
AST: 22 IU/L (ref 0–40)
Albumin: 4 g/dL (ref 3.5–4.7)
Alkaline Phosphatase: 50 IU/L (ref 39–117)
BUN/Creatinine Ratio: 14 (ref 12–28)
BUN: 15 mg/dL (ref 8–27)
Bilirubin Total: 0.6 mg/dL (ref 0.0–1.2)
CALCIUM: 9.8 mg/dL (ref 8.7–10.3)
CHLORIDE: 99 mmol/L (ref 96–106)
CO2: 30 mmol/L — ABNORMAL HIGH (ref 20–29)
Creatinine, Ser: 1.07 mg/dL — ABNORMAL HIGH (ref 0.57–1.00)
GFR calc Af Amer: 55 mL/min/{1.73_m2} — ABNORMAL LOW (ref >59)
GFR, EST NON AFRICAN AMERICAN: 48 mL/min/{1.73_m2} — AB (ref >59)
GLUCOSE: 96 mg/dL (ref 65–99)
Globulin, Total: 2.6 g/dL (ref 1.5–4.5)
POTASSIUM: 3.8 mmol/L (ref 3.5–5.2)
Sodium: 145 mmol/L — ABNORMAL HIGH (ref 134–144)
Total Protein: 6.6 g/dL (ref 6.0–8.5)

## 2017-01-14 LAB — CBC WITH DIFFERENTIAL/PLATELET
BASOS ABS: 0 10*3/uL (ref 0.0–0.2)
BASOS: 0 %
EOS (ABSOLUTE): 0.3 10*3/uL (ref 0.0–0.4)
EOS: 5 %
Hematocrit: 44 % (ref 34.0–46.6)
Hemoglobin: 14.9 g/dL (ref 11.1–15.9)
IMMATURE GRANS (ABS): 0 10*3/uL (ref 0.0–0.1)
Immature Granulocytes: 0 %
LYMPHS ABS: 1.8 10*3/uL (ref 0.7–3.1)
LYMPHS: 28 %
MCH: 31.5 pg (ref 26.6–33.0)
MCHC: 33.9 g/dL (ref 31.5–35.7)
MCV: 93 fL (ref 79–97)
MONOCYTES: 12 %
Monocytes Absolute: 0.7 10*3/uL (ref 0.1–0.9)
NEUTROS ABS: 3.4 10*3/uL (ref 1.4–7.0)
Neutrophils: 55 %
PLATELETS: 171 10*3/uL (ref 150–379)
RBC: 4.73 x10E6/uL (ref 3.77–5.28)
RDW: 13 % (ref 12.3–15.4)
WBC: 6.3 10*3/uL (ref 3.4–10.8)

## 2017-01-14 LAB — LIPID PANEL
CHOLESTEROL TOTAL: 157 mg/dL (ref 100–199)
Chol/HDL Ratio: 2 ratio (ref 0.0–4.4)
HDL: 78 mg/dL (ref >39)
LDL Calculated: 61 mg/dL (ref 0–99)
Triglycerides: 92 mg/dL (ref 0–149)
VLDL CHOLESTEROL CAL: 18 mg/dL (ref 5–40)

## 2017-01-14 LAB — VITAMIN D 25 HYDROXY (VIT D DEFICIENCY, FRACTURES): VIT D 25 HYDROXY: 74.6 ng/mL (ref 30.0–100.0)

## 2017-01-14 LAB — HEMOGLOBIN A1C
ESTIMATED AVERAGE GLUCOSE: 111 mg/dL
Hgb A1c MFr Bld: 5.5 % (ref 4.8–5.6)

## 2017-01-14 LAB — TSH: TSH: 0.086 u[IU]/mL — ABNORMAL LOW (ref 0.450–4.500)

## 2017-01-16 ENCOUNTER — Emergency Department (HOSPITAL_COMMUNITY): Payer: Medicare Other

## 2017-01-16 ENCOUNTER — Encounter (HOSPITAL_COMMUNITY): Payer: Self-pay | Admitting: Nurse Practitioner

## 2017-01-16 DIAGNOSIS — I2510 Atherosclerotic heart disease of native coronary artery without angina pectoris: Secondary | ICD-10-CM | POA: Diagnosis not present

## 2017-01-16 DIAGNOSIS — Z7982 Long term (current) use of aspirin: Secondary | ICD-10-CM | POA: Diagnosis not present

## 2017-01-16 DIAGNOSIS — Y9389 Activity, other specified: Secondary | ICD-10-CM | POA: Diagnosis not present

## 2017-01-16 DIAGNOSIS — X509XXA Other and unspecified overexertion or strenuous movements or postures, initial encounter: Secondary | ICD-10-CM | POA: Diagnosis not present

## 2017-01-16 DIAGNOSIS — I10 Essential (primary) hypertension: Secondary | ICD-10-CM | POA: Insufficient documentation

## 2017-01-16 DIAGNOSIS — Y929 Unspecified place or not applicable: Secondary | ICD-10-CM | POA: Insufficient documentation

## 2017-01-16 DIAGNOSIS — Z87891 Personal history of nicotine dependence: Secondary | ICD-10-CM | POA: Diagnosis not present

## 2017-01-16 DIAGNOSIS — E039 Hypothyroidism, unspecified: Secondary | ICD-10-CM | POA: Insufficient documentation

## 2017-01-16 DIAGNOSIS — S99911A Unspecified injury of right ankle, initial encounter: Secondary | ICD-10-CM | POA: Diagnosis present

## 2017-01-16 DIAGNOSIS — S92354A Nondisplaced fracture of fifth metatarsal bone, right foot, initial encounter for closed fracture: Secondary | ICD-10-CM | POA: Diagnosis not present

## 2017-01-16 DIAGNOSIS — Z79899 Other long term (current) drug therapy: Secondary | ICD-10-CM | POA: Insufficient documentation

## 2017-01-16 DIAGNOSIS — Y999 Unspecified external cause status: Secondary | ICD-10-CM | POA: Insufficient documentation

## 2017-01-16 DIAGNOSIS — J449 Chronic obstructive pulmonary disease, unspecified: Secondary | ICD-10-CM | POA: Insufficient documentation

## 2017-01-16 NOTE — ED Triage Notes (Signed)
Pt states last night as she woke up to go to the bathroom, she twisted her right ankle. C/o pain 8/10 especially when she tries weight bearing.

## 2017-01-17 ENCOUNTER — Emergency Department (HOSPITAL_COMMUNITY)
Admission: EM | Admit: 2017-01-17 | Discharge: 2017-01-17 | Disposition: A | Discharge status: Home / Self Care | Payer: Medicare Other | Attending: Emergency Medicine | Admitting: Emergency Medicine

## 2017-01-17 DIAGNOSIS — S92354A Nondisplaced fracture of fifth metatarsal bone, right foot, initial encounter for closed fracture: Secondary | ICD-10-CM

## 2017-01-17 NOTE — ED Provider Notes (Signed)
[facility] DEPT Provider Note   CSN: [id number] Arrival date & time: 01/16/17  2156     History   Chief Complaint Chief Complaint  Patient presents with  . Ankle Pain    Right    HPI [name] is a 83 y.o. female.  She is here for evaluation of right foot pain, which started about 24 hours ago when she stood up to go to the bathroom during the night.  She feels like she inverted her right ankle.  Since then she has had to "hobble", because of pain in her right foot.  No other injuries.  No prior similar problems.  There are no other known modifying factors.  HPI  Past Medical History:  Diagnosis Date  . Allergic rhinitis   . Aortic stenosis   . Carotid artery disease ([place])   . Colon polyps   . h/o Atrial fibrillation ([place]) 03/22/2011   Brief post op   . Hematuria 11/05/2015  . Hypercalcemia   . Hypertension   . Hypothyroid   . Osteopenia   . 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 Pressure urticaria   . RLS (restless legs syndrome)   . S/P aortic valve replacement and aortoplasty 04/13/2011   2m Medtronic Freestyle porcine aortic root   . S/P aortic valve replacement with stentless valve 03/18/2011    Patient Active Problem List   Diagnosis Date Noted  . Medication management 03/11/2016  . Dyspnea on exertion 03/11/2016  . Sleep disorder 12/11/2015  . Elevated hemoglobin A1c 11/13/2015  . Vitamin deficiency 11/13/2015  . 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 Allergic rhinitis 11/05/2015  . Hematuria 11/05/2015  . Hypothyroidism; on synthroid 11/05/2015  . Osteopenia 11/05/2015  . h/o Hypercalcemia 11/05/2015  . h/o RLS (restless legs syndrome) 11/05/2015  . h/o Intention tremor  11/05/2015  . History of nephrolithiasis 11/05/2015  . S/P aortic valve replacement and aortoplasty 04/13/2011  . h/o Atrial fibrillation ([place] 03/22/2011  . S/P aortic valve replacement with stentless valve 03/18/2011  . Carotid artery disease ([place]   . Hypertension   . COPD (chronic  obstructive pulmonary disease) with emphysema ([place] 03/02/2011    Past Surgical History:  Procedure Laterality Date  . AORTIC ROOT REPLACEMENT  03/18/2011   236mMedtronic Freestyle Porcine aortic root with reimplantation of coronary arteries  . BREAST LUMPECTOMY     Left  . CATARACT EXTRACTION    . CHOLECYSTECTOMY    . FOOT SURGERY     Morton's neuroma  . KNEE ARTHROSCOPY     Left    OB History    No data available       Home Medications    Prior to Admission medications   Medication Sig Start Date End Date Taking? Authorizing Provider  albuterol (PROAIR HFA) 108 (90 Base) MCG/ACT inhaler Inhale 2 puffs into the lungs every 6 (six) hours as needed for wheezing or shortness of breath. 10/21/16   ByCollene GobbleMD  albuterol (PROVENTIL) (2.5 MG/3ML) 0.083% nebulizer solution Take 3 mLs (2.5 mg total) by nebulization every 4 (four) hours as needed for wheezing or shortness of breath. 10/19/16   ByCollene GobbleMD  amLODipine (NORVASC) 2.5 MG tablet Take 1 tablet (2.5 mg total) by mouth daily. 11/03/16   OpMellody DanceDO  aspirin 81 MG tablet Take 81 mg by mouth daily.    [provider]  chlorthalidone (HYGROTON) 25 MG tablet Take 0.5 tablets (12.5 mg total) by mouth every other day. 11/03/16   Opalski,  Deborah, DO  DiphenhydrAMINE HCl (BENADRYL ALLERGY PO) Take 1 capsule by mouth at bedtime.    [provider]  levothyroxine (SYNTHROID, LEVOTHROID) 125 MCG tablet TAKE ONE TABLET BY MOUTH ONCE DAILY 11/11/16   Opalski, Neoma Laming, DO  Melatonin 10 MG CAPS Take 10 mg by mouth at bedtime.    [provider]  Multiple Vitamins-Minerals (MULTIVITAMIN WITH MINERALS) tablet Take 1 tablet by mouth daily.    [provider]  predniSONE (DELTASONE) 5 MG tablet Take 1 tablet (5 mg total) by mouth daily with breakfast. 11/03/16   Opalski, Neoma Laming, DO  simvastatin (ZOCOR) 20 MG tablet TAKE 1 TABLET BY MOUTH AT BEDTIME 12/07/16   Danford, Valetta Fuller D, NP  STIOLTO  RESPIMAT 2.5-2.5 MCG/ACT AERS INHALE 2 PUFFS INTO THE LUNGS DAILY 09/09/16   Collene Gobble, MD    Family History Family History  Problem Relation Age of Onset  . Cancer Mother        bladder  . Lung cancer Father   . Heart disease Maternal Grandmother   . Emphysema Maternal Aunt   . Asthma Maternal Aunt   . Asthma Maternal Uncle   . Breast cancer Sister   . Uterine cancer Sister     Social History Social History  Substance Use Topics  . Smoking status: Former Smoker    Packs/day: 0.90    Years: 62.00    Types: Cigarettes    Quit date: 07/14/2010  . Smokeless tobacco: Never Used  . Alcohol use No     Allergies   Clarithromycin; Tamsulosin; and Penicillins   Review of Systems Review of Systems  All other systems reviewed and are negative.    Physical Exam Updated Vital Signs BP (!) 159/88 (BP Location: Left Arm)   Pulse 80   Temp 98.2 F (36.8 C) (Oral)   Resp 15   SpO2 93%   Physical Exam  Constitutional: She is oriented to person, place, and time. She appears well-developed.  Elderly, frail  HENT:  Head: Normocephalic and atraumatic.  Neck: Normal range of motion and phonation normal. Neck supple.  Cardiovascular: Normal rate.   Pulmonary/Chest: Effort normal.  Musculoskeletal:  Right foot tender and swollen laterally, with ecchymosis, from the lateral malleolus to the region of the proximal right fifth metatarsal.  Moderate tenderness of the base of the fifth metatarsal.  Neurovascular intact distally in the right foot toes.  Neurological: She is alert and oriented to person, place, and time. She exhibits normal muscle tone.  Skin: Skin is warm and dry.  Psychiatric: She has a normal mood and affect. Her behavior is normal. Judgment and thought content normal.  Nursing note and vitals reviewed.    ED Treatments / Results  Labs (all labs ordered are listed, but only abnormal results are displayed) Labs Reviewed - No data to display  EKG  EKG  Interpretation None       Radiology Dg Ankle Complete Right  Result Date: 01/16/2017 CLINICAL DATA:  Pain after twisting injury last night when getting up to go to the bathroom. EXAM: RIGHT ANKLE - COMPLETE 3+ VIEW COMPARISON:  None. FINDINGS: There is an acute transverse fracture at the base of fifth metatarsal. No other acute fracture. Remote corticated fragments at the medial malleolar tip. Mild tibiotalar arthritic changes. IMPRESSION: Nondisplaced fifth metatarsal base fracture. Electronically Signed   By: Andreas Newport M.D.   On: 01/16/2017 23:09    Procedures Procedures (including critical care time)  Medications Ordered in ED Medications -  No data to display   Initial Impression / Assessment and Plan / ED Course  I have reviewed the triage vital signs and the nursing notes.  Pertinent labs & imaging results that were available during my care of the patient were reviewed by me and considered in my medical decision making (see chart for details).      Patient Vitals for the past 24 hrs:  BP Temp Temp src Pulse Resp SpO2  01/17/17 0144 (!) 159/88 - - 80 15 93 %  01/16/17 2205 (!) 164/78 98.2 F (36.8 C) Oral 84 18 95 %    ASO splint applied by nursing personnel  1:56 AM Reevaluation with update and discussion. After initial assessment and treatment, an updated evaluation reveals she is more comfortable now, findings discussed with patient and daughter, all questions answered. Demarquis Osley L     Final Clinical Impressions(s) / ED Diagnoses   Final diagnoses:  Closed nondisplaced fracture of fifth metatarsal bone of right foot, initial encounter    Fracture right foot, without displacement, no indication for surgical repair immediately.  Nursing Notes Reviewed/ Care Coordinated Applicable Imaging Reviewed Interpretation of Laboratory Data incorporated into ED treatment  The patient appears reasonably screened and/or stabilized for discharge and I doubt any  other medical condition or other [facility] requiring further screening, evaluation, or treatment in the ED at this time prior to discharge.  Plan: Home Medications-OTC analgesia; Home Treatments-ASO splint, for comfort, likely for 1 month; return here if the recommended treatment, does not improve the symptoms; Recommended follow up-PCP follow-up 1 week.   New Prescriptions New Prescriptions   No medications on file     Daleen Bo, MD 01/17/17 [phone]

## 2017-01-17 NOTE — Discharge Instructions (Signed)
Use the splint, when you are walking.  Elevate your right leg above your heart is much as possible for about a week.  For pain, use Tylenol or Motrin.

## 2017-01-20 ENCOUNTER — Ambulatory Visit (INDEPENDENT_AMBULATORY_CARE_PROVIDER_SITE_OTHER): Payer: Medicare Other | Admitting: Family Medicine

## 2017-01-20 ENCOUNTER — Encounter: Payer: Self-pay | Admitting: Family Medicine

## 2017-01-20 VITALS — BP 124/66 | HR 80 | Ht 64.0 in | Wt 121.0 lb

## 2017-01-20 DIAGNOSIS — E038 Other specified hypothyroidism: Secondary | ICD-10-CM

## 2017-01-20 DIAGNOSIS — M8580 Other specified disorders of bone density and structure, unspecified site: Secondary | ICD-10-CM

## 2017-01-20 DIAGNOSIS — R7309 Other abnormal glucose: Secondary | ICD-10-CM

## 2017-01-20 DIAGNOSIS — I779 Disorder of arteries and arterioles, unspecified: Secondary | ICD-10-CM

## 2017-01-20 DIAGNOSIS — I739 Peripheral vascular disease, unspecified: Secondary | ICD-10-CM

## 2017-01-20 DIAGNOSIS — G479 Sleep disorder, unspecified: Secondary | ICD-10-CM

## 2017-01-20 DIAGNOSIS — J438 Other emphysema: Secondary | ICD-10-CM

## 2017-01-20 DIAGNOSIS — I10 Essential (primary) hypertension: Secondary | ICD-10-CM

## 2017-01-20 DIAGNOSIS — E569 Vitamin deficiency, unspecified: Secondary | ICD-10-CM

## 2017-01-20 MED ORDER — LEVOTHYROXINE SODIUM 112 MCG PO TABS: 112.0000 ug | ORAL_TABLET | Freq: Every day | ORAL | 3 refills | Status: DC

## 2017-01-20 NOTE — Patient Instructions (Addendum)
-  Stop your chlorthalidone.  Don't take it at all.  Please keep an eye on your blood pressures at home as you have been.  If you have any symptoms of dizziness, lightheadedness, visual changes, bad headaches etc., give me a call let us know.  -   we also change your Synthroid or thyroid medicine from 125-112.  I want you to come in in about 6 weeks and we can recheck your TSH, T3 and T4 along with a BMP with Ca because we took you off chlorthalidone.  - MELATONIN TO 15 MG NIGHTLY ME KNOW IF THIS HELPS IF IT DOESN'T WE CAN ALWAYS TRY A SMALL DOSE OF TRAZODONE ON YOU.    If you have insomnia or difficulty sleeping, this information is for you:  - Avoid caffeinated beverages after lunch,  no alcoholic beverages,  no eating within 2-3 hours of lying down,  avoid exposure to blue light before bed,  avoid daytime naps, and  needs to maintain a regular sleep schedule- go to sleep and wake up around the same time every night.   - Resolve concerns or worries before entering bedroom:  Discussed relaxation techniques with patient and to keep a journal to write down fears\ worries.  I suggested seeing a counselor for CBT.   - Recommend patient meditate or do deep breathing exercises to help relax.   Incorporate the use of white noise machines or listen to "sleep meditation music", or recordings of guided meditations for sleep from YouTube which are free, such as  "guided meditation for detachment from over thinking"  by Mayford Knife.

## 2017-01-20 NOTE — Progress Notes (Signed)
Impression and Recommendations:    1. Osteopenia, unspecified location   2. Elevated hemoglobin A1c   3. Other emphysema ([place])   4. Bilateral carotid artery disease ([place])   5. Essential hypertension   6. h/o Hypercalcemia   7. Vitamin deficiency   8. Sleep disorder   9. Other specified hypothyroidism     - Patient due to see her cardiologist Dr. Tamala Julian next month.  I encouraged patient to ask him if he felt a repeat carotid ultrasound was prudent as well as echocardiogram.    No problem-specific Assessment & Plan notes found for this encounter.    Education and routine counseling performed. Handouts provided.   New Prescriptions   LEVOTHYROXINE (SYNTHROID, LEVOTHROID) 112 MCG TABLET    Take 1 tablet (112 mcg total) by mouth daily.    Discontinued Medications   CHLORTHALIDONE (HYGROTON) 25 MG TABLET    Take 0.5 tablets (12.5 mg total) by mouth every other day.   LEVOTHYROXINE (SYNTHROID, LEVOTHROID) 125 MCG TABLET    TAKE ONE TABLET BY MOUTH ONCE DAILY    Modified Medications   No medications on file    No orders of the defined types were placed in this encounter.    No Follow-up on file.  The patient was counseled, risk factors were discussed, anticipatory guidance given.  Gross side effects, risk and benefits, and alternatives of medications discussed with patient.  Patient is aware that all medications have potential side effects and we are unable to predict every side effect or drug-drug interaction that may occur.  Expresses verbal understanding and consents to current therapy plan and treatment regimen.  Please see AVS handed out to patient at the end of our visit for further patient instructions/ counseling done pertaining to today's office visit.    Note: This document was prepared using Dragon voice recognition software and may include unintentional dictation errors.     Subjective:    Chief Complaint  Patient presents with  . Annual Exam     HPI: [name] is a 83 y.o. female who presents to [place] [facility] today for follow up for HTN.     Got up Friday night and walked to bathroom- put Foot onto the floor and felt a pop in her right foot.  And then back pain laterally.  Patient has a nondisplaced fracture of the base of her fifth metatarsal.  She has mild pain.  They gave her a postop shoe and/or fracture boot I assume at the ER however she is unable to get on and off.  She's been walking with just an Ace wrap on it and the pain has been tolerable.   No problems updated.  --> Pulm- not using albuterol.  She uses it once every 5-6 months but has been very good since on the steel toe respite mat as well as the prednisone.   HTN:  -  Her blood pressure has been controlled at home.  She brought in her blood pressure readings from home.  They've been running 142-100/ 78-50.  Patient is completely asymptomatic.  She denies any dizziness or lightheadedness if she gets up too quickly.  - Patient reports good compliance with blood pressure medications.   She's been taking half of her Norvasc tablet and a half a day of the chlorthalidone tablet as well.  Tolerating it well. - Denies medication S-E   - Smoking Status noted   - She denies new onset of:  chest pain, exercise intolerance, shortness of breath, dizziness, visual changes, headache, lower extremity swelling or claudication.   Today their BP is BP: 124/66   Last 3 blood pressure readings in our office are as follows: BP Readings from Last 3 Encounters:  01/20/17 124/66  01/17/17 (!) 159/88  11/03/16 (!) 91/49    Pulse Readings from Last 3 Encounters:  01/20/17 80  01/17/17 80  11/03/16 75    Filed Weights   01/20/17 1057  Weight: 121 lb (54.9 kg)      Patient Care Team    Relationship Specialty Notifications Start End  Mellody Dance, DO PCP - General Family Medicine  10/08/15   Rexene Alberts, MD  Cardiothoracic Surgery   03/05/11   Belva Crome, MD  Cardiology  03/05/11   Collene Gobble, MD Consulting Physician Pulmonary Disease  11/05/15   Magdalen Spatz, NP Nurse Practitioner Pulmonary Disease  11/05/15      Lab Results  Component Value Date   CREATININE 1.07 (H) 01/13/2017   BUN 15 01/13/2017   NA 145 (H) 01/13/2017   K 3.8 01/13/2017   CL 99 01/13/2017   CO2 30 (H) 01/13/2017    Lab Results  Component Value Date   CHOL 157 01/13/2017   CHOL 133 11/08/2015    Lab Results  Component Value Date   HDL 78 01/13/2017   HDL 63 11/08/2015    Lab Results  Component Value Date   LDLCALC 61 01/13/2017   LDLCALC 53 11/08/2015    Lab Results  Component Value Date   TRIG 92 01/13/2017   TRIG 84 11/08/2015    Lab Results  Component Value Date   CHOLHDL 2.0 01/13/2017   CHOLHDL 2.1 11/08/2015    No results found for: LDLDIRECT ===================================================================   Patient Active Problem List   Diagnosis Date Noted  . S/P aortic valve replacement with stentless valve 03/18/2011    Priority: High  . Carotid artery disease ([place])     Priority: High  . Hypertension     Priority: High  . COPD (chronic obstructive pulmonary disease) with emphysema ([place]) 03/02/2011    Priority: High  . Osteopenia 11/05/2015    Priority: Medium  . h/o Intention tremor  11/05/2015    Priority: Medium  . Medication management 03/11/2016  . Dyspnea on exertion 03/11/2016  . Sleep disorder 12/11/2015  . Elevated hemoglobin A1c 11/13/2015  . Vitamin deficiency 11/13/2015  . 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 Allergic rhinitis 11/05/2015  . Hematuria 11/05/2015  . Hypothyroidism; on synthroid 11/05/2015  . h/o Hypercalcemia 11/05/2015  . h/o RLS (restless legs syndrome) 11/05/2015  . History of nephrolithiasis 11/05/2015  . S/P aortic valve replacement and aortoplasty 04/13/2011  . h/o Atrial fibrillation ([place]) 03/22/2011     Past Medical History:  Diagnosis Date    . Allergic rhinitis   . Aortic stenosis   . Carotid artery disease ([place])   . Colon polyps   . h/o Atrial fibrillation ([place]) 03/22/2011   Brief post op   . Hematuria 11/05/2015  . Hypercalcemia   . Hypertension   . Hypothyroid   . Osteopenia   . 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 Pressure urticaria   . RLS (restless legs syndrome)   . S/P aortic valve replacement and aortoplasty 04/13/2011   68m Medtronic Freestyle porcine aortic root   . S/P aortic valve replacement with stentless valve 03/18/2011     Past Surgical History:  Procedure Laterality Date  . AORTIC  ROOT REPLACEMENT  03/18/2011   58m Medtronic Freestyle Porcine aortic root with reimplantation of coronary arteries  . BREAST LUMPECTOMY     Left  . CATARACT EXTRACTION    . CHOLECYSTECTOMY    . FOOT SURGERY     Morton's neuroma  . KNEE ARTHROSCOPY     Left     Family History  Problem Relation Age of Onset  . Cancer Mother        bladder  . Lung cancer Father   . Heart disease Maternal Grandmother   . Emphysema Maternal Aunt   . Asthma Maternal Aunt   . Asthma Maternal Uncle   . Breast cancer Sister   . Uterine cancer Sister      History  Drug Use No  ,  History  Alcohol Use No  ,  History  Smoking Status  . Former Smoker  . Packs/day: 0.90  . Years: 62.00  . Types: Cigarettes  . Quit date: 07/14/2010  Smokeless Tobacco  . Never Used  ,    Current Outpatient Prescriptions on File Prior to Visit  Medication Sig Dispense Refill  . albuterol (PROAIR HFA) 108 (90 Base) MCG/ACT inhaler Inhale 2 puffs into the lungs every 6 (six) hours as needed for wheezing or shortness of breath. 1 Inhaler 3  . albuterol (PROVENTIL) (2.5 MG/3ML) 0.083% nebulizer solution Take 3 mLs (2.5 mg total) by nebulization every 4 (four) hours as needed for wheezing or shortness of breath. 300 mL 5  . amLODipine (NORVASC) 2.5 MG tablet Take 1 tablet (2.5 mg total) by mouth daily. (Patient taking differently: Take 2.5 mg by  mouth daily. 1/2 tablet daily) 90 tablet 1  . aspirin 81 MG tablet Take 81 mg by mouth daily.    . DiphenhydrAMINE HCl (BENADRYL ALLERGY PO) Take 1 capsule by mouth at bedtime.    . Melatonin 10 MG CAPS Take 10 mg by mouth at bedtime.    . Multiple Vitamins-Minerals (MULTIVITAMIN WITH MINERALS) tablet Take 1 tablet by mouth daily.    . predniSONE (DELTASONE) 5 MG tablet Take 1 tablet (5 mg total) by mouth daily with breakfast. 90 tablet 1  . simvastatin (ZOCOR) 20 MG tablet TAKE 1 TABLET BY MOUTH AT BEDTIME 90 tablet 0  . STIOLTO RESPIMAT 2.5-2.5 MCG/ACT AERS INHALE 2 PUFFS INTO THE LUNGS DAILY 4 g 5   No current facility-administered medications on file prior to visit.      Allergies  Allergen Reactions  . Clarithromycin     Mouth/throat sore  . Tamsulosin Itching  . Penicillins Itching and Rash     Review of Systems:   General:  Denies fever, chills Optho/Auditory:   Denies visual changes, blurred vision Respiratory:   Denies SOB, cough, wheeze, DIB  Cardiovascular:   Denies chest pain, palpitations, painful respirations Gastrointestinal:   Denies nausea, vomiting, diarrhea.  Endocrine:     Denies new hot or cold intolerance Musculoskeletal:  Denies joint swelling, gait issues, or new unexplained myalgias/ arthralgias Skin:  Denies rash, suspicious lesions  Neurological:    Denies dizziness, unexplained weakness, numbness  Psychiatric/Behavioral:   Denies mood changes  Objective:    Blood pressure 124/66, pulse 80, height _0  (1.626 m), weight 121 lb (54.9 kg).  Body mass index is 20.77 kg/m.  General: Well Developed, well nourished, and in no acute distress.  HEENT: Normocephalic, atraumatic, pupils equal round reactive to light, neck supple, No carotid bruits, no JVD Skin: Warm and dry, cap RF less 2  sec Cardiac: Regular rate and rhythm, S1, S2 WNL's, no murmurs rubs or gallops Respiratory: ECTA B/L, Not using accessory muscles, speaking in full  sentences. NeuroM-Sk: Ambulates w/o assistance, moves ext * 4 w/o difficulty, sensation grossly intact.  Ext: scant edema b/l lower ext Psych: No HI/SI, judgement and insight good, Euthymic mood. Full Affect.

## 2017-02-09 ENCOUNTER — Encounter: Payer: Self-pay | Admitting: Family Medicine

## 2017-02-09 ENCOUNTER — Ambulatory Visit (INDEPENDENT_AMBULATORY_CARE_PROVIDER_SITE_OTHER): Payer: Medicare Other | Admitting: Family Medicine

## 2017-02-09 VITALS — BP 117/56 | HR 80 | Ht 64.0 in | Wt 121.4 lb

## 2017-02-09 DIAGNOSIS — F17200 Nicotine dependence, unspecified, uncomplicated: Secondary | ICD-10-CM

## 2017-02-09 DIAGNOSIS — Z0000 Encounter for general adult medical examination without abnormal findings: Secondary | ICD-10-CM

## 2017-02-09 DIAGNOSIS — Z5181 Encounter for therapeutic drug level monitoring: Secondary | ICD-10-CM

## 2017-02-09 DIAGNOSIS — F341 Dysthymic disorder: Secondary | ICD-10-CM

## 2017-02-09 DIAGNOSIS — Z87891 Personal history of nicotine dependence: Secondary | ICD-10-CM | POA: Insufficient documentation

## 2017-02-09 MED ORDER — BUPROPION HCL 75 MG PO TABS: ORAL_TABLET | ORAL | 1 refills | Status: DC

## 2017-02-09 NOTE — Patient Instructions (Addendum)
Make two f/up appts:   Come back end of October to recheck some labs and see how doing on the new medicine wellbutrin.  WE will come up with quit date next OV if tol med well and inc dose then  - Also make a follow-up for procedure only visit to burn a couple spots off of your left hand and your upper lip.   Preventive Care for Adults, Female  A healthy lifestyle and preventive care can promote health and wellness. Preventive health guidelines for women include the following key practices.   A routine yearly physical is a good way to check with your health care provider about your health and preventive screening. It is a chance to share any concerns and updates on your health and to receive a thorough exam.   Visit your dentist for a routine exam and preventive care every 6 months. Brush your teeth twice a day and floss once a day. Good oral hygiene prevents tooth decay and gum disease.   The frequency of eye exams is based on your age, health, family medical history, use of contact lenses, and other factors. Follow your health care provider's recommendations for frequency of eye exams.   Eat a healthy diet. Foods like vegetables, fruits, whole grains, low-fat dairy products, and lean protein foods contain the nutrients you need without too many calories. Decrease your intake of foods high in solid fats, added sugars, and salt. Eat the right amount of calories for you.Get information about a proper diet from your health care provider, if necessary.   Regular physical exercise is one of the most important things you can do for your health. Most adults should get at least 150 minutes of moderate-intensity exercise (any activity that increases your heart rate and causes you to sweat) each week. In addition, most adults need muscle-strengthening exercises on 2 or more days a week.   Maintain a healthy weight. The body mass index (BMI) is a screening tool to identify possible weight problems.  It provides an estimate of body fat based on height and weight. Your health care provider can find your BMI, and can help you achieve or maintain a healthy weight.For adults 20 years and older:   - A BMI below 18.5 is considered underweight.   - A BMI of 18.5 to 24.9 is normal.   - A BMI of 25 to 29.9 is considered overweight.   - A BMI of 30 and above is considered obese.   Maintain normal blood lipids and cholesterol levels by exercising and minimizing your intake of trans and saturated fats.  Eat a balanced diet with plenty of fruit and vegetables. Blood tests for lipids and cholesterol should begin at age 2 and be repeated every 5 years minimum.  If your lipid or cholesterol levels are high, you are over 40, or you are at high risk for heart disease, you may need your cholesterol levels checked more frequently.Ongoing high lipid and cholesterol levels should be treated with medicines if diet and exercise are not working.   If you smoke, find out from your health care provider how to quit. If you do not use tobacco, do not start.   Lung cancer screening is recommended for adults aged 32-80 years who are at high risk for developing lung cancer because of a history of smoking. A yearly low-dose CT scan of the lungs is recommended for people who have at least a 30-pack-year history of smoking and are a current smoker or  have quit within the past 15 years. A pack year of smoking is smoking an average of 1 pack of cigarettes a day for 1 year (for example: 1 pack a day for 30 years or 2 packs a day for 15 years). Yearly screening should continue until the smoker has stopped smoking for at least 15 years. Yearly screening should be stopped for people who develop a health problem that would prevent them from having lung cancer treatment.   If you are pregnant, do not drink alcohol. If you are breastfeeding, be very cautious about drinking alcohol. If you are not pregnant and choose to drink alcohol, do  not have more than 1 drink per day. One drink is considered to be 12 ounces (355 mL) of beer, 5 ounces (148 mL) of wine, or 1.5 ounces (44 mL) of liquor.   Avoid use of street drugs. Do not share needles with anyone. Ask for help if you need support or instructions about stopping the use of drugs.   High blood pressure causes heart disease and increases the risk of stroke. Your blood pressure should be checked at least yearly.  Ongoing high blood pressure should be treated with medicines if weight loss and exercise do not work.   If you are 56-55 years old, ask your health care provider if you should take aspirin to prevent strokes.   Diabetes screening involves taking a blood sample to check your fasting blood sugar level. This should be done once every 3 years, after age 64, if you are within normal weight and without risk factors for diabetes. Testing should be considered at a younger age or be carried out more frequently if you are overweight and have at least 1 risk factor for diabetes.   Breast cancer screening is essential preventive care for women. You should practice "breast self-awareness."  This means understanding the normal appearance and feel of your breasts and may include breast self-examination.  Any changes detected, no matter how small, should be reported to a health care provider.  Women in their 68s and 30s should have a clinical breast exam (CBE) by a health care provider as part of a regular health exam every 1 to 3 years.  After age 79, women should have a CBE every year.  Starting at age 15, women should consider having a mammogram (breast X-ray test) every year.  Women who have a family history of breast cancer should talk to their health care provider about genetic screening.  Women at a high risk of breast cancer should talk to their health care providers about having an MRI and a mammogram every year.   -Breast cancer gene (BRCA)-related cancer risk assessment is  recommended for women who have family members with BRCA-related cancers. BRCA-related cancers include breast, ovarian, tubal, and peritoneal cancers. Having family members with these cancers may be associated with an increased risk for harmful changes (mutations) in the breast cancer genes BRCA1 and BRCA2. Results of the assessment will determine the need for genetic counseling and BRCA1 and BRCA2 testing.   The Pap test is a screening test for cervical cancer. A Pap test can show cell changes on the cervix that might become cervical cancer if left untreated. A Pap test is a procedure in which cells are obtained and examined from the lower end of the uterus (cervix).   - Women should have a Pap test starting at age 85.   - Between ages 13 and 52, Pap tests should be repeated  every 2 years.   - Beginning at age 56, you should have a Pap test every 3 years as long as the past 3 Pap tests have been normal.   - Some women have medical problems that increase the chance of getting cervical cancer. Talk to your health care provider about these problems. It is especially important to talk to your health care provider if a new problem develops soon after your last Pap test. In these cases, your health care provider may recommend more frequent screening and Pap tests.   - The above recommendations are the same for women who have or have not gotten the vaccine for human papillomavirus (HPV).   - If you had a hysterectomy for a problem that was not cancer or a condition that could lead to cancer, then you no longer need Pap tests. Even if you no longer need a Pap test, a regular exam is a good idea to make sure no other problems are starting.   - If you are between ages 69 and 40 years, and you have had normal Pap tests going back 10 years, you no longer need Pap tests. Even if you no longer need a Pap test, a regular exam is a good idea to make sure no other problems are starting.   - If you have had past  treatment for cervical cancer or a condition that could lead to cancer, you need Pap tests and screening for cancer for at least 20 years after your treatment.   - If Pap tests have been discontinued, risk factors (such as a new sexual partner) need to be reassessed to determine if screening should be resumed.   - The HPV test is an additional test that may be used for cervical cancer screening. The HPV test looks for the virus that can cause the cell changes on the cervix. The cells collected during the Pap test can be tested for HPV. The HPV test could be used to screen women aged 63 years and older, and should be used in women of any age who have unclear Pap test results. After the age of 61, women should have HPV testing at the same frequency as a Pap test.   Colorectal cancer can be detected and often prevented. Most routine colorectal cancer screening begins at the age of 38 years and continues through age 42 years. However, your health care provider may recommend screening at an earlier age if you have risk factors for colon cancer. On a yearly basis, your health care provider may provide home test kits to check for hidden blood in the stool.  Use of a small camera at the end of a tube, to directly examine the colon (sigmoidoscopy or colonoscopy), can detect the earliest forms of colorectal cancer. Talk to your health care provider about this at age 66, when routine screening begins. Direct exam of the colon should be repeated every 5 -10 years through age 11 years, unless early forms of pre-cancerous polyps or small growths are found.   People who are at an increased risk for hepatitis B should be screened for this virus. You are considered at high risk for hepatitis B if:  -You were born in a country where hepatitis B occurs often. Talk with your health care provider about which countries are considered high risk.  - Your parents were born in a high-risk country and you have not received a shot  to protect against hepatitis B (hepatitis B vaccine).  -  You have HIV or AIDS.  - You use needles to inject street drugs.  - You live with, or have sex with, someone who has Hepatitis B.  - You get hemodialysis treatment.  - You take certain medicines for conditions like cancer, organ transplantation, and autoimmune conditions.   Hepatitis C blood testing is recommended for all people born from [birth year] through 1965 and any individual with known risks for hepatitis C.   Practice safe sex. Use condoms and avoid high-risk sexual practices to reduce the spread of sexually transmitted infections (STIs). STIs include gonorrhea, chlamydia, syphilis, trichomonas, herpes, HPV, and human immunodeficiency virus (HIV). Herpes, HIV, and HPV are viral illnesses that have no cure. They can result in disability, cancer, and death. Sexually active women aged 66 years and younger should be checked for chlamydia. Older women with new or multiple partners should also be tested for chlamydia. Testing for other STIs is recommended if you are sexually active and at increased risk.   Osteoporosis is a disease in which the bones lose minerals and strength with aging. This can result in serious bone fractures or breaks. The risk of osteoporosis can be identified using a bone density scan. Women ages 27 years and over and women at risk for fractures or osteoporosis should discuss screening with their health care providers. Ask your health care provider whether you should take a calcium supplement or vitamin D to There are also several preventive steps women can take to avoid osteoporosis and resulting fractures or to keep osteoporosis from worsening. -->Recommendations include:  Eat a balanced diet high in fruits, vegetables, calcium, and vitamins.  Get enough calcium. The recommended total intake of is 1,200 mg daily; for best absorption, if taking supplements, divide doses into 250-500 mg doses throughout the day. Of the two  types of calcium, calcium carbonate is best absorbed when taken with food but calcium citrate can be taken on an empty stomach.  Get enough vitamin D. [employer] and the [place] recommend at least 1,000 IU per day for women age 35 and over who are at risk of vitamin D deficiency. Vitamin D deficiency can be caused by inadequate sun exposure (for example, those who live in [place]).  Avoid alcohol and smoking. Heavy alcohol intake (more than 7 drinks per week) increases the risk of falls and hip fracture and women smokers tend to lose bone more rapidly and have lower bone mass than nonsmokers. Stopping smoking is one of the most important changes women can make to improve their health and decrease risk for disease.  Be physically active every day. Weight-bearing exercise (for example, fast walking, hiking, jogging, and weight training) may strengthen bones or slow the rate of bone loss that comes with aging. Balancing and muscle-strengthening exercises can reduce the risk of falling and fracture.  Consider therapeutic medications. Currently, several types of effective drugs are available. Healthcare providers can recommend the type most appropriate for each woman.  Eliminate environmental factors that may contribute to accidents. Falls cause nearly 90% of all osteoporotic fractures, so reducing this risk is an important bone-health strategy. Measures include ample lighting, removing obstructions to walking, using nonskid rugs on floors, and placing mats and/or grab bars in showers.  Be aware of medication side effects. Some common medicines make bones weaker. These include a type of steroid drug called glucocorticoids used for arthritis and asthma, some antiseizure drugs, certain sleeping pills, treatments for endometriosis, and some cancer drugs. An overactive thyroid gland or using  too much thyroid hormone for an underactive thyroid can also be a problem. If you are taking  these medicines, talk to your doctor about what you can do to help protect your bones.reduce the rate of osteoporosis.    Menopause can be associated with physical symptoms and risks. Hormone replacement therapy is available to decrease symptoms and risks. You should talk to your health care provider about whether hormone replacement therapy is right for you.   Use sunscreen. Apply sunscreen liberally and repeatedly throughout the day. You should seek shade when your shadow is shorter than you. Protect yourself by wearing long sleeves, pants, a wide-brimmed hat, and sunglasses year round, whenever you are outdoors.   Once a month, do a whole body skin exam, using a mirror to look at the skin on your back. Tell your health care provider of new moles, moles that have irregular borders, moles that are larger than a pencil eraser, or moles that have changed in shape or color.   -Stay current with required vaccines (immunizations).   Influenza vaccine. All adults should be immunized every year.  Tetanus, diphtheria, and acellular pertussis (Td, Tdap) vaccine. Pregnant women should receive 1 dose of Tdap vaccine during each pregnancy. The dose should be obtained regardless of the length of time since the last dose. Immunization is preferred during the 27th 36th week of gestation. An adult who has not previously received Tdap or who does not know her vaccine status should receive 1 dose of Tdap. This initial dose should be followed by tetanus and diphtheria toxoids (Td) booster doses every 10 years. Adults with an unknown or incomplete history of completing a 3-dose immunization series with Td-containing vaccines should begin or complete a primary immunization series including a Tdap dose. Adults should receive a Td booster every 10 years.  Varicella vaccine. An adult without evidence of immunity to varicella should receive 2 doses or a second dose if she has previously received 1 dose. Pregnant females  who do not have evidence of immunity should receive the first dose after pregnancy. This first dose should be obtained before leaving the health care facility. The second dose should be obtained 4 8 weeks after the first dose.  Human papillomavirus (HPV) vaccine. Females aged 6 26 years who have not received the vaccine previously should obtain the 3-dose series. The vaccine is not recommended for use in pregnant females. However, pregnancy testing is not needed before receiving a dose. If a female is found to be pregnant after receiving a dose, no treatment is needed. In that case, the remaining doses should be delayed until after the pregnancy. Immunization is recommended for any person with an immunocompromised condition through the age of 32 years if she did not get any or all doses earlier. During the 3-dose series, the second dose should be obtained 4 8 weeks after the first dose. The third dose should be obtained 24 weeks after the first dose and 16 weeks after the second dose.  Zoster vaccine. One dose is recommended for adults aged 84 years or older unless certain conditions are present.  Measles, mumps, and rubella (MMR) vaccine. Adults born before [birth year] generally are considered immune to measles and mumps. Adults born in [birth year] or later should have 1 or more doses of MMR vaccine unless there is a contraindication to the vaccine or there is laboratory evidence of immunity to each of the three diseases. A routine second dose of MMR vaccine should be obtained at least 28 days  after the first dose for students attending postsecondary schools, health care workers, or international travelers. People who received inactivated measles vaccine or an unknown type of measles vaccine during 1963 1967 should receive 2 doses of MMR vaccine. People who received inactivated mumps vaccine or an unknown type of mumps vaccine before 1979 and are at high risk for mumps infection should consider immunization with 2 doses  of MMR vaccine. For females of childbearing age, rubella immunity should be determined. If there is no evidence of immunity, females who are not pregnant should be vaccinated. If there is no evidence of immunity, females who are pregnant should delay immunization until after pregnancy. Unvaccinated health care workers born before [birth year] who lack laboratory evidence of measles, mumps, or rubella immunity or laboratory confirmation of disease should consider measles and mumps immunization with 2 doses of MMR vaccine or rubella immunization with 1 dose of MMR vaccine.  Pneumococcal 13-valent conjugate (PCV13) vaccine. When indicated, a person who is uncertain of her immunization history and has no record of immunization should receive the PCV13 vaccine. An adult aged 74 years or older who has certain medical conditions and has not been previously immunized should receive 1 dose of PCV13 vaccine. This PCV13 should be followed with a dose of pneumococcal polysaccharide (PPSV23) vaccine. The PPSV23 vaccine dose should be obtained at least 8 weeks after the dose of PCV13 vaccine. An adult aged 43 years or older who has certain medical conditions and previously received 1 or more doses of PPSV23 vaccine should receive 1 dose of PCV13. The PCV13 vaccine dose should be obtained 1 or more years after the last PPSV23 vaccine dose.  Pneumococcal polysaccharide (PPSV23) vaccine. When PCV13 is also indicated, PCV13 should be obtained first. All adults aged 66 years and older should be immunized. An adult younger than age 9 years who has certain medical conditions should be immunized. Any person who resides in a nursing home or long-term care facility should be immunized. An adult smoker should be immunized. People with an immunocompromised condition and certain other conditions should receive both PCV13 and PPSV23 vaccines. People with human immunodeficiency virus (HIV) infection should be immunized as soon as possible after  diagnosis. Immunization during chemotherapy or radiation therapy should be avoided. Routine use of PPSV23 vaccine is not recommended for American Indians, [place] Natives, or people younger than 65 years unless there are medical conditions that require PPSV23 vaccine. When indicated, people who have unknown immunization and have no record of immunization should receive PPSV23 vaccine. One-time revaccination 5 years after the first dose of PPSV23 is recommended for people aged 62 64 years who have chronic kidney failure, nephrotic syndrome, asplenia, or immunocompromised conditions. People who received 1 2 doses of PPSV23 before age 31 years should receive another dose of PPSV23 vaccine at age 59 years or later if at least 5 years have passed since the previous dose. Doses of PPSV23 are not needed for people immunized with PPSV23 at or after age 44 years.  Meningococcal vaccine. Adults with asplenia or persistent complement component deficiencies should receive 2 doses of quadrivalent meningococcal conjugate (MenACWY-D) vaccine. The doses should be obtained at least 2 months apart. Microbiologists working with certain meningococcal bacteria, [employer], people at risk during an outbreak, and people who travel to or live in countries with a high rate of meningitis should be immunized. A first-year college student up through age 49 years who is living in a residence hall should receive a dose if she did  not receive a dose on or after her 16th birthday. Adults who have certain high-risk conditions should receive one or more doses of vaccine.  Hepatitis A vaccine. Adults who wish to be protected from this disease, have certain high-risk conditions, work with hepatitis A-infected animals, work in hepatitis A research labs, or travel to or work in countries with a high rate of hepatitis A should be immunized. Adults who were previously unvaccinated and who anticipate close contact with an international adoptee  during the first 60 days after arrival in the [place] from a country with a high rate of hepatitis A should be immunized.  Hepatitis B vaccine.  Adults who wish to be protected from this disease, have certain high-risk conditions, may be exposed to blood or other infectious body fluids, are household contacts or sex partners of hepatitis B positive people, are clients or workers in certain care facilities, or travel to or work in countries with a high rate of hepatitis B should be immunized.  Haemophilus influenzae type b (Hib) vaccine. A previously unvaccinated person with asplenia or sickle cell disease or having a scheduled splenectomy should receive 1 dose of Hib vaccine. Regardless of previous immunization, a recipient of a hematopoietic stem cell transplant should receive a 3-dose series 6 12 months after her successful transplant. Hib vaccine is not recommended for adults with HIV infection.  Ages 17 years and over  Blood pressure check.** / Every 1 to 2 years.  Lipid and cholesterol check.** / Every 5 years beginning at age 79 years.  Lung cancer screening. / Every year if you are aged 60 80 years and have a 30-pack-year history of smoking and currently smoke or have quit within the past 15 years. Yearly screening is stopped once you have quit smoking for at least 15 years or develop a health problem that would prevent you from having lung cancer treatment.  Clinical breast exam.** / Every year after age 24 years.  BRCA-related cancer risk assessment.** / For women who have family members with a BRCA-related cancer (breast, ovarian, tubal, or peritoneal cancers).  Mammogram.** / Every year beginning at age 49 years and continuing for as long as you are in good health. Consult with your health care provider.  Pap test.** / Every 3 years starting at age 70 years through age 33 or 33 years with 3 consecutive normal Pap tests. Testing can be stopped between 65 and 70 years with 3  consecutive normal Pap tests and no abnormal Pap or HPV tests in the past 10 years.  HPV screening.** / Every 3 years from ages 66 years through ages 42 or 38 years with a history of 3 consecutive normal Pap tests. Testing can be stopped between 65 and 70 years with 3 consecutive normal Pap tests and no abnormal Pap or HPV tests in the past 10 years.  Fecal occult blood test (FOBT) of stool. / Every year beginning at age 100 years and continuing until age 50 years. You may not need to do this test if you get a colonoscopy every 10 years.  Flexible sigmoidoscopy or colonoscopy.** / Every 5 years for a flexible sigmoidoscopy or every 10 years for a colonoscopy beginning at age 33 years and continuing until age 67 years.  Hepatitis C blood test.** / For all people born from [birth year] through 1963 and any individual with known risks for hepatitis C.  Osteoporosis screening.** / A one-time screening for women ages 35 years and over and women at risk  for fractures or osteoporosis.  Skin self-exam. / Monthly.  Influenza vaccine. / Every year.  Tetanus, diphtheria, and acellular pertussis (Tdap/Td) vaccine.** / 1 dose of Td every 10 years.  Varicella vaccine.** / Consult your health care provider.  Zoster vaccine.** / 1 dose for adults aged 75 years or older.  Pneumococcal 13-valent conjugate (PCV13) vaccine.** / Consult your health care provider.  Pneumococcal polysaccharide (PPSV23) vaccine.** / 1 dose for all adults aged 17 years and older.  Meningococcal vaccine.** / Consult your health care provider.  Hepatitis A vaccine.** / Consult your health care provider.  Hepatitis B vaccine.** / Consult your health care provider.  Haemophilus influenzae type b (Hib) vaccine.** / Consult your health care provider. ** Family history and personal history of risk and conditions may change your health care provider's recommendations. Document Released: 06/30/2001 Document Revised: 02/22/2013  [facility]  Patient Information 2014 [place], [place].   EXERCISE AND DIET:  We recommended that you start or continue a regular exercise program for good health. Regular exercise means any activity that makes your heart beat faster and makes you sweat.  We recommend exercising at least 30 minutes per day at least 3 days a week, preferably 5.  We also recommend a diet low in fat and sugar / carbohydrates.  Inactivity, poor dietary choices and obesity can cause diabetes, heart attack, stroke, and kidney damage, among others.     ALCOHOL AND SMOKING:  Women should limit their alcohol intake to no more than 7 drinks/beers/glasses of wine (combined, not each!) per week. Moderation of alcohol intake to this level decreases your risk of breast cancer and liver damage.  ( And of course, no recreational drugs are part of a healthy lifestyle.)  Also, you should not be smoking at all or even being exposed to second hand smoke. Most people know smoking can cause cancer, and various heart and lung diseases, but did you know it also contributes to weakening of your bones?  Aging of your skin?  Yellowing of your teeth and nails?   CALCIUM AND VITAMIN D:  Adequate intake of calcium and Vitamin D are recommended.  The recommendations for exact amounts of these supplements seem to change often, but generally speaking 600 mg of calcium (either carbonate or citrate) and 800 units of Vitamin D per day seems prudent. Certain women may benefit from higher intake of Vitamin D.  If you are among these women, your doctor will have told you during your visit.     PAP SMEARS:  Pap smears, to check for cervical cancer or precancers,  have traditionally been done yearly, although recent scientific advances have shown that most women can have pap smears less often.  However, every woman still should have a physical exam from her gynecologist or primary care physician every year. It will include a breast check, inspection of the vulva and vagina  to check for abnormal growths or skin changes, a visual exam of the cervix, and then an exam to evaluate the size and shape of the uterus and ovaries.  And after 81 years of age, a rectal exam is indicated to check for rectal cancers. We will also provide age appropriate advice regarding health maintenance, like when you should have certain vaccines, screening for sexually transmitted diseases, bone density testing, colonoscopy, mammograms, etc.    MAMMOGRAMS:  All women over 20 years old should have a yearly mammogram. Many facilities now offer a "3D" mammogram, which may cost around $50 extra out of pocket.  If possible,  we recommend you accept the option to have the 3D mammogram performed.  It both reduces the number of women who will be called back for extra views which then turn out to be normal, and it is better than the routine mammogram at detecting truly abnormal areas.     COLONOSCOPY:  Colonoscopy to screen for colon cancer is recommended for all women at age 48.  We know, you hate the idea of the prep.  We agree, BUT, having colon cancer and not knowing it is worse!!  Colon cancer so often starts as a polyp that can be seen and removed at colonscopy, which can quite literally save your life!  And if your first colonoscopy is normal and you have no family history of colon cancer, most women don't have to have it again for 10 years.  Once every ten years, you can do something that may end up saving your life, right?  We will be happy to help you get it scheduled when you are ready.  Be sure to check your insurance coverage so you understand how much it will cost.  It may be covered as a preventative service at no cost, but you should check your particular policy.

## 2017-02-09 NOTE — Progress Notes (Signed)
Subjective:   [name] is a 83 y.o. female who presents for Medicare Annual (Subsequent) preventive examination.  Last office visit on 9\5\18 we stopped her chlorthalidone for blood pressure.  Her blood pressures at home have been well controlled.  She does not have any symptoms of dizziness, lightheadedness, headaches etc.  She did have a few morning she woke up with some headaches but they are completely gone now.  Her blood pressures at home have been in the normal range and her granddaughter who is a CMA has been monitoring it.    mammo-  Feb 2018  Bone density- 11/ 2017-  On ca and vit D supp  Going to see Cards Oct 25th  Review of Systems:  General:   Denies fever, chills, unexplained weight loss.  Optho/Auditory:   Denies visual changes, blurred vision/LOV Respiratory:   Denies SOB, DOE more than baseline levels.  Cardiovascular:   Denies chest pain, palpitations, new onset peripheral edema  Gastrointestinal:   Denies nausea, vomiting, diarrhea.  Genitourinary: Denies dysuria, freq/ urgency, flank pain or discharge from genitals.  Endocrine:     Denies hot or cold intolerance, polyuria, polydipsia. Musculoskeletal:   Denies unexplained myalgias, joint swelling, unexplained arthralgias, gait problems.  Skin:  Denies rash, suspicious lesions Neurological:     Denies dizziness, unexplained weakness, numbness  Psychiatric/Behavioral:   Denies mood changes, suicidal or homicidal ideations, hallucinations    6CIT Screen 02/09/2017 01/20/2017  What Year? 0 points 0 points  What month? 0 points 0 points  What time? 0 points 0 points  Count back from 20 0 points 0 points  Months in reverse 0 points 0 points  Repeat phrase 0 points 2 points  Total Score 0 2    Depression screen [facility] 2/9 02/09/2017 01/20/2017 01/20/2017  Decreased Interest 0 0 0  Down, Depressed, Hopeless 0 0 0  PHQ - 2 Score 0 0 0  Altered sleeping 0 0 -  Tired, decreased energy 0 0 -  Change in appetite 0 0 -   Feeling bad or failure about yourself  0 0 -  Trouble concentrating 0 0 -  Moving slowly or fidgety/restless 0 0 -  Suicidal thoughts 0 0 -  PHQ-9 Score 0 0 -    Fall Risk  02/09/2017 01/20/2017 11/03/2016 02/14/2016 11/13/2015  Falls in the past year? Yes Yes No No No  Number falls in past yr: 1 1 - - -  Injury with Fall? Yes Yes - - -    Activities of Daily Living In your present state of health, do you have difficulty performing the following activities? 1- Driving - no 2- Managing money - no 3- Feeding yourself - no 4- Getting from the bed to the chair - no 5- Climbing a flight of stairs - no 6- Preparing food and eating - no 7- Bathing or showering - no 8- Getting dressed - no 9- Getting to the toilet - no 10- Using the toilet - no 11- Moving around from place to place - no     Objective:    Vitals: BP (!) 117/56   Pulse 80   Ht _0  (1.626 m)   Wt 121 lb 6.4 oz (55.1 kg)   BMI 20.84 kg/m   Body mass index is 20.84 kg/m.   Tobacco History  Smoking Status  . Former Smoker  . Packs/day: 0.90  . Years: 62.00  . Types: Cigarettes  . Quit date: 07/14/2010  Smokeless Tobacco  .  Never Used     Counseling given: Yes   Past Medical History:  Diagnosis Date  . Allergic rhinitis   . Aortic stenosis   . Carotid artery disease ([place])   . Colon polyps   . h/o Atrial fibrillation ([place]) 03/22/2011   Brief post op   . Hematuria 11/05/2015  . Hypercalcemia   . Hypertension   . Hypothyroid   . Osteopenia   . 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 Pressure urticaria   . RLS (restless legs syndrome)   . S/P aortic valve replacement and aortoplasty 04/13/2011   69m Medtronic Freestyle porcine aortic root   . S/P aortic valve replacement with stentless valve 03/18/2011   Past Surgical History:  Procedure Laterality Date  . AORTIC ROOT REPLACEMENT  03/18/2011   277mMedtronic Freestyle Porcine aortic root with reimplantation of coronary arteries  . BREAST LUMPECTOMY      Left  . CATARACT EXTRACTION    . CHOLECYSTECTOMY    . FOOT SURGERY     Morton's neuroma  . KNEE ARTHROSCOPY     Left   Family History  Problem Relation Age of Onset  . Cancer Mother        bladder  . Lung cancer Father   . Heart disease Maternal Grandmother   . Emphysema Maternal Aunt   . Asthma Maternal Aunt   . Asthma Maternal Uncle   . Breast cancer Sister   . Uterine cancer Sister    History  Sexual Activity  . Sexual activity: No    Outpatient Encounter Prescriptions as of 02/09/2017  Medication Sig  . albuterol (PROAIR HFA) 108 (90 Base) MCG/ACT inhaler Inhale 2 puffs into the lungs every 6 (six) hours as needed for wheezing or shortness of breath.  . Marland Kitchenlbuterol (PROVENTIL) (2.5 MG/3ML) 0.083% nebulizer solution Take 3 mLs (2.5 mg total) by nebulization every 4 (four) hours as needed for wheezing or shortness of breath.  . Marland KitchenmLODipine (NORVASC) 2.5 MG tablet Take 1 tablet (2.5 mg total) by mouth daily. (Patient taking differently: Take 2.5 mg by mouth daily. 1/2 tablet daily)  . aspirin 81 MG tablet Take 81 mg by mouth daily.  . DiphenhydrAMINE HCl (BENADRYL ALLERGY PO) Take 1 capsule by mouth at bedtime.  . Marland Kitchenevothyroxine (SYNTHROID, LEVOTHROID) 112 MCG tablet Take 1 tablet (112 mcg total) by mouth daily.  . Melatonin 10 MG CAPS Take 10 mg by mouth at bedtime.  . Multiple Vitamins-Minerals (MULTIVITAMIN WITH MINERALS) tablet Take 1 tablet by mouth daily.  . predniSONE (DELTASONE) 5 MG tablet Take 1 tablet (5 mg total) by mouth daily with breakfast.  . simvastatin (ZOCOR) 20 MG tablet TAKE 1 TABLET BY MOUTH AT BEDTIME  . STIOLTO RESPIMAT 2.5-2.5 MCG/ACT AERS INHALE 2 PUFFS INTO THE LUNGS DAILY   No facility-administered encounter medications on file as of 02/09/2017.     Activities of Daily Living In your present state of health, do you have any difficulty performing the following activities: 02/09/2017  Hearing? N  Vision? N  Difficulty concentrating or making  decisions? N  Walking or climbing stairs? N  Dressing or bathing? N  Doing errands, shopping? N  Some recent data might be hidden    Patient Care Team: OpMellody DanceDO as PCP - General (Family Medicine) OwRexene AlbertsMD (Cardiothoracic Surgery) SmBelva CromeMD (Cardiology) ByCollene GobbleMD as Consulting Physician (Pulmonary Disease) GrMagdalen SpatzNP as Nurse Practitioner (Pulmonary Disease)    Assessment:    *** Exercise  Activities and Dietary recommendations    Goals    None     Fall Risk Fall Risk  02/09/2017 01/20/2017 11/03/2016 02/14/2016 11/13/2015  Falls in the past year? Yes Yes No No No  Number falls in past yr: 1 1 - - -  Injury with Fall? Yes Yes - - -   Depression Screen PHQ 2/9 Scores 02/09/2017 01/20/2017 01/20/2017 11/03/2016  PHQ - 2 Score 0 0 0 0  PHQ- 9 Score 0 0 - -     Cognitive Function MMSE - Mini Mental State Exam 11/13/2015  Orientation to time 5  Orientation to Place 5  Registration 3  Attention/ Calculation 5  Recall 3  Language- name 2 objects 2  Language- repeat 1  Language- follow 3 step command 3  Language- read & follow direction 1  Write a sentence 1  Copy design 0  Total score 29     6CIT Screen 02/09/2017 01/20/2017  What Year? 0 points 0 points  What month? 0 points 0 points  What time? 0 points 0 points  Count back from 20 0 points 0 points  Months in reverse 0 points 0 points  Repeat phrase 0 points 2 points  Total Score 0 2    Immunization History  Administered Date(s) Administered  . Influenza Whole 03/16/2012  . Influenza,inj,Quad PF,6+ Mos 03/20/2013, 02/18/2015  . Influenza-Unspecified 02/15/2014, 12/17/2015  . Pneumococcal Conjugate-13 10/24/2013   Screening Tests Health Maintenance  Topic Date Due  . INFLUENZA VACCINE  02/15/2017 (Originally 12/16/2016)  . TETANUS/TDAP  11/03/2017 (Originally 05/14/1949)  . PNA vac Low Risk Adult (2 of 2 - PPSV23) 11/03/2017 (Originally 10/25/2014)  . DEXA SCAN   Completed      Plan:   ***   I have personally reviewed and noted the following in the patient's chart:   . Medical and social history . Use of alcohol, tobacco or illicit drugs  . Current medications and supplements . Functional ability and status . Nutritional status . Physical activity . Advanced directives . List of other physicians . Hospitalizations, surgeries, and ER visits in previous 12 months . Vitals . Screenings to include cognitive, depression, and falls . Referrals and appointments  In addition, I have reviewed and discussed with patient certain preventive protocols, quality metrics, and best practice recommendations. A written personalized care plan for preventive services as well as general preventive health recommendations were provided to patient.     Mellody Dance, DO  02/09/2017

## 2017-02-11 ENCOUNTER — Telehealth: Payer: Self-pay

## 2017-02-11 NOTE — Telephone Encounter (Signed)
Pt informed.  Pt expressed understanding and is agreeable.  Charyl Bigger, CMA

## 2017-02-11 NOTE — Telephone Encounter (Signed)
B/c pt's Bp's have the variability they have, I would not advise a change in medications for fear of having too much of a deleterious effect when her blood pressure is in the low range- it will bring it lower.    This can cause patient to be dizzy and fall which could be life-threatening for someone of her age.    I would advise her to eat a heart healthy low-sodium diet and see if restricting her salt intake would help further improve blood pressures.  Also, please advise patient that some blood pressure variability is normal depending on emotional stressors.   By adding the Wellbutrin recently, this may help abate any effect from emotional stressors.  Also, caffeine and especially nicotine that pt has been doing more of lately, can raise blood pressures, so I recommend she avoid both of those!    Cont to monitor and f/up in a couple months. Sooner if sx dev

## 2017-02-11 NOTE — Telephone Encounter (Signed)
Pt reports BP readings:  AM readings were:  179/125,145/83,  137/76,  113/68,  127/67, 124/72, 160/67  PM readings were:   145/76,  154/73,  151/81,  153/81,  160/67,  137/70,  130/69,  132/66,  145/75,  143/72,  131/66,  124/59,  135/62,  139/63  Please advise.  Charyl Bigger, CMA

## 2017-02-15 DIAGNOSIS — J449 Chronic obstructive pulmonary disease, unspecified: Secondary | ICD-10-CM | POA: Diagnosis not present

## 2017-02-16 ENCOUNTER — Encounter: Payer: Self-pay | Admitting: Family Medicine

## 2017-02-16 ENCOUNTER — Ambulatory Visit (INDEPENDENT_AMBULATORY_CARE_PROVIDER_SITE_OTHER): Payer: Medicare Other | Admitting: Family Medicine

## 2017-02-16 VITALS — BP 117/50 | HR 80 | Ht 64.0 in | Wt 122.7 lb

## 2017-02-16 DIAGNOSIS — L570 Actinic keratosis: Secondary | ICD-10-CM | POA: Diagnosis not present

## 2017-02-16 NOTE — Progress Notes (Addendum)
Impression and Recommendations:    1. Actinic keratosis- L hand    Actinic keratosis- L hand  Liquid nitrogen was applied for 10-12 seconds to the skin lesions and the expected blistering or scabbing reaction explained.  Do not pick at the areas.  Patient reminded to expect hypopigmented scars from the procedure. Return if lesions fail to fully resolve.  The patient was counseled, risk factors were discussed, anticipatory guidance given.  Gross side effects, risk and benefits, and alternatives of medications and treatment plan in general discussed with patient.  Patient is aware that all medications have potential side effects and we are unable to predict every side effect or drug-drug interaction that may occur.   Patient will call with any questions prior to using medication if they have concerns.  Expresses verbal understanding and consents to current therapy and treatment regimen.  No barriers to understanding were identified.  Red flag symptoms and signs discussed in detail.  Patient expressed understanding regarding what to do in case of emergency\urgent symptoms  Please see AVS handed out to patient at the end of our visit for further patient instructions/ counseling done pertaining to today's office visit.   Return if symptoms worsen or fail to improve or complications from procedure today, for Otherwise, follow-up for chronic care.     Note: This document was prepared using Dragon voice recognition software and may include unintentional dictation errors.  Mellody Dance 4:46 PM --------------------------------------------------------------------------------------------------------------------------------------------------------------------------------------------------------------------------------------------    Subjective:    CC:  Chief Complaint  Patient presents with  . Skin lesions cryotherapy    HPI: [name] is a 83 y.o. female who presents to [place] [facility] today for issues as discussed below.  -Patient is here for cryotherapy of actinic and solar keratosis on her bilateral hands and forearms.  She has had this done in the past but prefers to get it done here if she can.  She denies any bleeding or major changes in her skin lesions.  She has approximately 5 in her bilateral upper extremities.   Wt Readings from Last 3 Encounters:  04/16/17 124 lb (56.2 kg)  03/11/17 121 lb 12.8 oz (55.2 kg)  03/03/17 120 lb 12.8 oz (54.8 kg)   BP Readings from Last 3 Encounters:  04/16/17 (!) 102/58  03/11/17 124/60  03/03/17 128/73   Pulse Readings from Last 3 Encounters:  04/16/17 80  03/11/17 74  03/03/17 73   BMI Readings from Last 3 Encounters:  04/16/17 21.28 kg/m  03/11/17 20.91 kg/m  03/03/17 20.74 kg/m     Patient Care Team    Relationship Specialty Notifications Start End  Mellody Dance, DO PCP - General Family Medicine  10/08/15   Rexene Alberts, MD  Cardiothoracic Surgery  03/05/11   Belva Crome, MD  Cardiology  03/05/11   Collene Gobble, MD Consulting Physician Pulmonary Disease  11/05/15   Magdalen Spatz, NP Nurse Practitioner Pulmonary Disease  11/05/15      Patient Active Problem List   Diagnosis Date Noted  . History of smoking   ( 45 yrs at 0.5ppd) ;1991- until 2012 ( 8 cig/day )   02/09/2017    Priority: High  . S/P aortic valve replacement with stentless valve 03/18/2011    Priority: High  . Carotid artery disease ([place])     Priority: High  . Hypertension     Priority: High  . COPD (chronic obstructive pulmonary disease) with  emphysema ([place]) 03/02/2011    Priority: High  . Osteopenia 11/05/2015    Priority: Medium  . h/o Intention tremor  11/05/2015    Priority: Medium  . Dysthymia 02/09/2017  . Medication management 03/11/2016  . Dyspnea on exertion 03/11/2016  . Sleep disorder 12/11/2015  . Elevated hemoglobin A1c 11/13/2015  . Vitamin deficiency 11/13/2015  .  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 Allergic rhinitis 11/05/2015  . Hematuria 11/05/2015  . Hypothyroidism; on synthroid 11/05/2015  . h/o Hypercalcemia 11/05/2015  . h/o RLS (restless legs syndrome) 11/05/2015  . History of nephrolithiasis 11/05/2015  . S/P aortic valve replacement and aortoplasty 04/13/2011  . h/o Atrial fibrillation ([place]) 03/22/2011    Past Medical history, Surgical history, Family history, Social history, Allergies and Medications have been entered into the medical record, reviewed and changed as needed.    Current Meds  Medication Sig  . albuterol (PROAIR HFA) 108 (90 Base) MCG/ACT inhaler Inhale 2 puffs into the lungs every 6 (six) hours as needed for wheezing or shortness of breath.  Marland Kitchen albuterol (PROVENTIL) (2.5 MG/3ML) 0.083% nebulizer solution Take 3 mLs (2.5 mg total) by nebulization every 4 (four) hours as needed for wheezing or shortness of breath.  Marland Kitchen aspirin 81 MG tablet Take 81 mg by mouth daily.  . DiphenhydrAMINE HCl (BENADRYL ALLERGY PO) Take 1 capsule by mouth at bedtime.  Marland Kitchen levothyroxine (SYNTHROID, LEVOTHROID) 112 MCG tablet Take 1 tablet (112 mcg total) by mouth daily.  . Melatonin 10 MG CAPS Take 10 mg by mouth at bedtime.  . Multiple Vitamins-Minerals (MULTIVITAMIN WITH MINERALS) tablet Take 1 tablet by mouth daily.  . predniSONE (DELTASONE) 5 MG tablet Take 1 tablet (5 mg total) by mouth daily with breakfast.  . [DISCONTINUED] amLODipine (NORVASC) 2.5 MG tablet Take 1 tablet (2.5 mg total) by mouth daily. (Patient not taking: Reported on 03/11/2017)  . [DISCONTINUED] buPROPion (WELLBUTRIN) 75 MG tablet Start with one half tablet twice daily for 4 weeks, then increase 1 tablet twice a day  . [DISCONTINUED] simvastatin (ZOCOR) 20 MG tablet TAKE 1 TABLET BY MOUTH AT BEDTIME  . [DISCONTINUED] STIOLTO RESPIMAT 2.5-2.5 MCG/ACT AERS INHALE 2 PUFFS INTO THE LUNGS DAILY    Allergies:  Allergies  Allergen Reactions  . Clarithromycin     Mouth/throat sore    . Tamsulosin Itching  . Penicillins Itching and Rash     Review of Systems: General:   Denies fever, chills, unexplained weight loss.  Optho/Auditory:   Denies visual changes, blurred vision/LOV Respiratory:   Denies wheeze, DOE more than baseline levels.  Cardiovascular:   Denies chest pain, palpitations, new onset peripheral edema  Gastrointestinal:   Denies nausea, vomiting, diarrhea, abd pain.  Genitourinary: Denies dysuria, freq/ urgency, flank pain or discharge from genitals.  Endocrine:     Denies hot or cold intolerance, polyuria, polydipsia. Musculoskeletal:   Denies unexplained myalgias, joint swelling, unexplained arthralgias, gait problems.  Skin:  Denies new onset rash, suspicious lesions Neurological:     Denies dizziness, unexplained weakness, numbness  Psychiatric/Behavioral:   Denies mood changes, suicidal or homicidal ideations, hallucinations    Objective:   Blood pressure (!) 117/50, pulse 80, height _0  (1.626 m), weight 122 lb 11.2 oz (55.7 kg). Body mass index is 21.06 kg/m. General:  Well Developed, well nourished, appropriate for stated age.  Neuro:  Alert and oriented,  extra-ocular muscles intact  HEENT:  Normocephalic, atraumatic, neck supple, no carotid bruits appreciated  Skin:  no gross rash, warm, pink.  See  procedure note above for details Cardiac:  RRR, S1 S2 Respiratory:  ECTA B/L and A/P, Not using accessory muscles, speaking in full sentences- unlabored. Vascular:  Ext warm, no cyanosis apprec.; cap RF less 2 sec. Psych:  No HI/SI, judgement and insight good, Euthymic mood. Full Affect.

## 2017-02-16 NOTE — Patient Instructions (Signed)
Blisters, Adult A blister is a raised bubble of skin filled with liquid. Blisters often develop in an area of the skin that repeatedly rubs or presses against another surface (friction blister). Friction blisters can occur on any part of the body, but they usually develop on the hands or feet. Long-term pressure on the same area of the skin can also lead to areas of hardened skin (calluses). What are the causes? A blister can be caused by:  An injury.  A burn.  An allergic reaction.  An infection.  Exposure to irritating chemicals.  Friction, especially in an area with a lot of heat and moisture.  Friction blisters often result from:  Sports.  Repetitive activities.  Using tools and doing other activities without wearing gloves.  Shoes that are too tight or too loose.  What are the signs or symptoms? A blister is often round and looks like a bump. It may:  Itch.  Be painful to the touch.  Before a blister forms, the skin may:  Become red.  Feel warm.  Itch.  Be painful to the touch.  How is this diagnosed? A blister is diagnosed with a physical exam. How is this treated? Treatment usually involves protecting the area where the blister has formed until the skin has healed. Other treatments may include:  A bandage (dressing) to cover the blister.  Extra padding around and over the blister, so that it does not rub on anything.  Antibiotic ointment.  Most blisters break open, dry up, and go away on their own within 1-2 weeks. Blisters that are very painful may be drained before they break open on their own. If the blister is large or painful, it can be drained by: 1. Sterilizing a small needle with rubbing alcohol. 2. Washing your hands with soap and water. 3. Inserting the needle in the edge of the blister to make a small hole. Some fluid will drain out of the hole. Let the top or roof of the blister stay in place. This helps the skin heal. 4. Washing the  blister with mild soap and water. 5. Covering the blister with antibiotic ointment, if prescribed by your health care provider, and a dressing.  Some blisters may need to be drained by a health care provider. Follow these instructions at home:  Protect the area where the blister has formed as told by your health care provider.  Keep your blister clean and dry. This helps to prevent infection.  Do not pop your blister. This can cause infection.  If you were prescribed an antibiotic, use it as told by your health care provider. Do not stop using the antibiotic even if your condition improves.  Wear different shoes until the blister heals.  Avoid the activity that caused the blister until your blister heals.  Check your blister every day for signs of infection. Check for: ? More redness, swelling, or pain. ? More fluid or blood. ? Warmth. ? Pus or a bad smell. ? The blister getting better and then getting worse. How is this prevented? Taking these steps can help to prevent blisters that are caused by friction. Make sure you:  Wear comfortable shoes that fit well.  Always wear socks with shoes.  Wear extra socks or use tape, bandages, or pads over blister-prone areas as needed. You may also apply petroleum jelly under bandages in blister-prone areas.  Wear protective gear, such as gloves, when participating in sports or activities that can cause blisters.  Wear loose-fitting,  moisture-wicking clothes when participating in sports or activities.  Use powders as needed to keep your feet dry.  Contact a health care provider if:  You have more redness, swelling, or pain around your blister.  You have more fluid or blood coming from your blister.  Your blister feels warm to the touch.  You have pus or a bad smell coming from your blister.  You have a fever or chills.  Your blister gets better and then it gets worse. This information is not intended to replace advice given to  you by your health care provider. Make sure you discuss any questions you have with your health care provider. Document Released: 06/11/2004 Document Revised: 01/01/2016 Document Reviewed: 11/15/2015 Elsevier Interactive Patient Education  2018 [employer].

## 2017-03-03 ENCOUNTER — Ambulatory Visit (INDEPENDENT_AMBULATORY_CARE_PROVIDER_SITE_OTHER): Payer: Medicare Other | Admitting: Family Medicine

## 2017-03-03 ENCOUNTER — Encounter: Payer: Self-pay | Admitting: Family Medicine

## 2017-03-03 VITALS — BP 128/73 | HR 73 | Ht 64.0 in | Wt 120.8 lb

## 2017-03-03 DIAGNOSIS — E038 Other specified hypothyroidism: Secondary | ICD-10-CM

## 2017-03-03 DIAGNOSIS — J438 Other emphysema: Secondary | ICD-10-CM

## 2017-03-03 DIAGNOSIS — Z79899 Other long term (current) drug therapy: Secondary | ICD-10-CM

## 2017-03-03 DIAGNOSIS — Z5181 Encounter for therapeutic drug level monitoring: Secondary | ICD-10-CM

## 2017-03-03 DIAGNOSIS — R7309 Other abnormal glucose: Secondary | ICD-10-CM

## 2017-03-03 DIAGNOSIS — M8580 Other specified disorders of bone density and structure, unspecified site: Secondary | ICD-10-CM | POA: Diagnosis not present

## 2017-03-03 DIAGNOSIS — Z87442 Personal history of urinary calculi: Secondary | ICD-10-CM | POA: Diagnosis not present

## 2017-03-03 DIAGNOSIS — F341 Dysthymic disorder: Secondary | ICD-10-CM | POA: Diagnosis not present

## 2017-03-03 DIAGNOSIS — F17200 Nicotine dependence, unspecified, uncomplicated: Secondary | ICD-10-CM

## 2017-03-03 DIAGNOSIS — I779 Disorder of arteries and arterioles, unspecified: Secondary | ICD-10-CM

## 2017-03-03 DIAGNOSIS — I739 Peripheral vascular disease, unspecified: Secondary | ICD-10-CM

## 2017-03-03 DIAGNOSIS — E569 Vitamin deficiency, unspecified: Secondary | ICD-10-CM | POA: Diagnosis not present

## 2017-03-03 DIAGNOSIS — Z9981 Dependence on supplemental oxygen: Secondary | ICD-10-CM

## 2017-03-03 DIAGNOSIS — I10 Essential (primary) hypertension: Secondary | ICD-10-CM

## 2017-03-03 DIAGNOSIS — Z87891 Personal history of nicotine dependence: Secondary | ICD-10-CM

## 2017-03-03 DIAGNOSIS — G479 Sleep disorder, unspecified: Secondary | ICD-10-CM

## 2017-03-03 MED ORDER — BUPROPION HCL 75 MG PO TABS: ORAL_TABLET | ORAL | 1 refills | Status: DC

## 2017-03-03 NOTE — Patient Instructions (Addendum)
Patient due to see her cardiologist Dr. Tamala Julian this month.  I encouraged patient to ask him if he felt a repeat carotid ultrasound was prudent as well as echocardiogram.  - Please pick up a new prescription of Wellbutrin at [facility].  We did not change the dose.  - We will get your lab work today and call you regarding the results sooner than later if there are abnormalities

## 2017-03-03 NOTE — Progress Notes (Signed)
Impression and Recommendations:    1. Other specified hypothyroidism   2. h/o Hypercalcemia   3. History of nephrolithiasis   4. Medication management   5. Oxygen dependent   6. Vitamin deficiency   7. Sleep disorder   8. History of smoking   ( 43 yrs at 0.5ppd) ;1991- until 2012 ( 8 cig/day )     9. Dysthymia   10. Nicotine dependence with current use   11. Essential hypertension   12. Osteopenia, unspecified location   13. Elevated hemoglobin A1c   14. Other emphysema ([place])   15. Bilateral carotid artery disease ([place])   16. Medication monitoring encounter     There are no diagnoses linked to this encounter.  No problem-specific Assessment & Plan notes found for this encounter.    Education and routine counseling performed. Handouts provided.   New Prescriptions   No medications on file    Discontinued Medications   AMLODIPINE (NORVASC) 2.5 MG TABLET    Take 1 tablet (2.5 mg total) by mouth daily.   DIPHENHYDRAMINE HCL (BENADRYL ALLERGY PO)    Take 1 capsule by mouth at bedtime.   MELATONIN 10 MG CAPS    Take 10 mg by mouth at bedtime.   SIMVASTATIN (ZOCOR) 20 MG TABLET    TAKE 1 TABLET BY MOUTH AT BEDTIME    Modified Medications   Modified Medication Previous Medication   BUPROPION (WELLBUTRIN) 75 MG TABLET buPROPion (WELLBUTRIN) 75 MG tablet      one half tablet twice daily    Start with one half tablet twice daily for 4 weeks, then increase 1 tablet twice a day   PREDNISONE (DELTASONE) 5 MG TABLET predniSONE (DELTASONE) 5 MG tablet      TAKE 1 TABLET BY MOUTH ONCE DAILY WITH BREAKFAST    Take 1 tablet (5 mg total) by mouth daily with breakfast.   SIMVASTATIN (ZOCOR) 20 MG TABLET simvastatin (ZOCOR) 20 MG tablet      TAKE 1 TABLET BY MOUTH AT BEDTIME    TAKE 1 TABLET BY MOUTH AT BEDTIME   STIOLTO RESPIMAT 2.5-2.5 MCG/ACT AERS STIOLTO RESPIMAT 2.5-2.5 MCG/ACT AERS      INHALE 2 PUFFS INTO THE LUNGS DAILY    INHALE 2 PUFFS INTO THE LUNGS DAILY    No orders  of the defined types were placed in this encounter.    Return in about 4 months (around 07/04/2017) for 72mof/up .  The patient was counseled, risk factors were discussed, anticipatory guidance given.  Gross side effects, risk and benefits, and alternatives of medications discussed with patient.  Patient is aware that all medications have potential side effects and we are unable to predict every side effect or drug-drug interaction that may occur.  Expresses verbal understanding and consents to current therapy plan and treatment regimen.  Please see AVS handed out to patient at the end of our visit for further patient instructions/ counseling done pertaining to today's office visit.    Note: This document was prepared using Dragon voice recognition software and may include unintentional dictation errors.     Subjective:    Chief Complaint  Patient presents with  . Follow-up    HPI: Renee STANKUSis a 83y.o. female [facility]      SMoking Cess:   Started on wellbutrin- On [id number]  Per patient she is tolerating it well and she  has no desire for a cigarette.  She is only taking one half tablet twice daily and feels that working very well for her.   She had not smoked at all since.  She feels well like she is normal.  She did not have any anxiety or depression so she cannot comment whether or not it has changed her mood all.  She feels good.  Even when she goes to visit her church friend who smokes, she does not have any desire to using e-cigarette etc.  After labs obtained on 8\29\18 with TSH low at 0.086.  We changed her Synthroid from 125-112 g daily.  She has not had that blood work rechecked we will do so today.  NO change in energy levels, "feels like her regular old self."   No problems updated.   HTN:  Home BP- 130's/ 60-70's couple times per week.   -  Her blood pressure has been controlled at  home.   - Patient reports good compliance with blood pressure medications  - Denies medication S-E   - Smoking Status noted   - She denies new onset of: chest pain, exercise intolerance, shortness of breath, dizziness, visual changes, headache, lower extremity swelling or claudication.   Today their BP is BP: 128/73   Last 3 blood pressure readings in our office are as follows: BP Readings from Last 3 Encounters:  07/07/17 129/61  04/16/17 (!) 102/58  03/11/17 124/60    Pulse Readings from Last 3 Encounters:  07/07/17 73  04/16/17 80  03/11/17 74    Filed Weights   03/03/17 1048  Weight: 120 lb 12.8 oz (54.8 kg)      Patient Care Team    Relationship Specialty Notifications Start End  Mellody Dance, DO PCP - General Family Medicine  10/08/15   Rexene Alberts, MD  Cardiothoracic Surgery  03/05/11   Belva Crome, MD  Cardiology  03/05/11   Collene Gobble, MD Consulting Physician Pulmonary Disease  11/05/15   Magdalen Spatz, NP Nurse Practitioner Pulmonary Disease  11/05/15      Lab Results  Component Value Date   CREATININE 1.12 (H) 03/03/2017   BUN 14 03/03/2017   NA 141 03/03/2017   K 4.2 03/03/2017   CL 99 03/03/2017   CO2 30 (H) 03/03/2017    Lab Results  Component Value Date   CHOL 157 01/13/2017   CHOL 133 11/08/2015    Lab Results  Component Value Date   HDL 78 01/13/2017   HDL 63 11/08/2015    Lab Results  Component Value Date   LDLCALC 61 01/13/2017   LDLCALC 53 11/08/2015    Lab Results  Component Value Date   TRIG 92 01/13/2017   TRIG 84 11/08/2015    Lab Results  Component Value Date   CHOLHDL 2.0 01/13/2017   CHOLHDL 2.1 11/08/2015    No results found for: LDLDIRECT ===================================================================   Patient Active Problem List   Diagnosis Date Noted  . History of smoking   ( 43 yrs at 0.5ppd) ;1991- until 2012 ( 8 cig/day )   02/09/2017    Priority: High  . S/P aortic valve  replacement with stentless valve 03/18/2011    Priority: High  . Carotid artery disease ([place])     Priority: High  . Hypertension     Priority: High  . COPD (chronic obstructive pulmonary disease) with emphysema ([place]) 03/02/2011    Priority: High  . Osteopenia 11/05/2015  Priority: Medium  . h/o Intention tremor  11/05/2015    Priority: Medium  . Dysthymia 02/09/2017  . Medication management 03/11/2016  . Dyspnea on exertion 03/11/2016  . Sleep disorder 12/11/2015  . Elevated hemoglobin A1c 11/13/2015  . Vitamin deficiency 11/13/2015  . 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 Allergic rhinitis 11/05/2015  . Hematuria 11/05/2015  . Hypothyroidism; on synthroid 11/05/2015  . h/o Hypercalcemia 11/05/2015  . h/o RLS (restless legs syndrome) 11/05/2015  . History of nephrolithiasis 11/05/2015  . S/P aortic valve replacement and aortoplasty 04/13/2011  . h/o Atrial fibrillation ([place]) 03/22/2011     Past Medical History:  Diagnosis Date  . Allergic rhinitis   . Aortic stenosis   . Carotid artery disease ([place])   . Colon polyps   . h/o Atrial fibrillation ([place]) 03/22/2011   Brief post op   . Hematuria 11/05/2015  . Hypercalcemia   . Hypertension   . Hypothyroid   . Osteopenia   . 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 Pressure urticaria   . RLS (restless legs syndrome)   . S/P aortic valve replacement and aortoplasty 04/13/2011   10m Medtronic Freestyle porcine aortic root   . S/P aortic valve replacement with stentless valve 03/18/2011     Past Surgical History:  Procedure Laterality Date  . AORTIC ROOT REPLACEMENT  03/18/2011   282mMedtronic Freestyle Porcine aortic root with reimplantation of coronary arteries  . BREAST LUMPECTOMY     Left  . CATARACT EXTRACTION    . CHOLECYSTECTOMY    . FOOT SURGERY     Morton's neuroma  . KNEE ARTHROSCOPY     Left     Family History  Problem Relation Age of Onset  . Cancer Mother        bladder  . Lung cancer Father   . Heart  disease Maternal Grandmother   . Emphysema Maternal Aunt   . Asthma Maternal Aunt   . Asthma Maternal Uncle   . Breast cancer Sister   . Uterine cancer Sister      Social History   Substance and Sexual Activity  Drug Use No  ,  Social History   Substance and Sexual Activity  Alcohol Use No  . Alcohol/week: 0.0 oz  ,  Social History   Tobacco Use  Smoking Status Former Smoker  . Packs/day: 0.90  . Years: 62.00  . Pack years: 55.80  . Types: Cigarettes  . Last attempt to quit: 07/14/2010  . Years since quitting: 6.9  Smokeless Tobacco Never Used  ,    Current Outpatient Medications on File Prior to Visit  Medication Sig Dispense Refill  . albuterol (PROAIR HFA) 108 (90 Base) MCG/ACT inhaler Inhale 2 puffs into the lungs every 6 (six) hours as needed for wheezing or shortness of breath. 1 Inhaler 3  . albuterol (PROVENTIL) (2.5 MG/3ML) 0.083% nebulizer solution Take 3 mLs (2.5 mg total) by nebulization every 4 (four) hours as needed for wheezing or shortness of breath. 300 mL 5  . aspirin 81 MG tablet Take 81 mg by mouth daily.    . Marland Kitchenevothyroxine (SYNTHROID, LEVOTHROID) 112 MCG tablet Take 1 tablet (112 mcg total) by mouth daily. 90 tablet 3  . Multiple Vitamins-Minerals (MULTIVITAMIN WITH MINERALS) tablet Take 1 tablet by mouth daily.     No current facility-administered medications on file prior to visit.      Allergies  Allergen Reactions  . Clarithromycin     Mouth/throat sore  . Tamsulosin Itching  . Penicillins  Itching and Rash     Review of Systems:   General:  Denies fever, chills Optho/Auditory:   Denies visual changes, blurred vision Respiratory:   Denies SOB, cough, wheeze, DIB  Cardiovascular:   Denies chest pain, palpitations, painful respirations Gastrointestinal:   Denies nausea, vomiting, diarrhea.  Endocrine:     Denies new hot or cold intolerance Musculoskeletal:  Denies joint swelling, gait issues, or new unexplained myalgias/  arthralgias Skin:  Denies rash, suspicious lesions  Neurological:    Denies dizziness, unexplained weakness, numbness  Psychiatric/Behavioral:   Denies mood changes  Objective:    Blood pressure 128/73, pulse 73, height _0  (1.626 m), weight 120 lb 12.8 oz (54.8 kg).  Body mass index is 20.74 kg/m.  General: Well Developed, well nourished, and in no acute distress.  HEENT: Normocephalic, atraumatic, pupils equal round reactive to light, neck supple, + carotid bruit on L >er R , no JVD Skin: Warm and dry, cap RF less 2 sec Cardiac: Regular rate and rhythm, S1, S2 WNL's, no murmurs rubs or gallops Respiratory: ECTA B/L- no wh, dec aeration, inc exhalation phase., Not using accessory muscles, speaking in full sentences. NeuroM-Sk: Ambulates w/o assistance, moves ext * 4 w/o difficulty, sensation grossly intact.  Ext: scant edema b/l lower ext Psych: No HI/SI, judgement and insight good, Euthymic mood. Full Affect.

## 2017-03-04 LAB — BASIC METABOLIC PANEL
BUN / CREAT RATIO: 13 (ref 12–28)
BUN: 14 mg/dL (ref 8–27)
CO2: 30 mmol/L — AB (ref 20–29)
Calcium: 9.8 mg/dL (ref 8.7–10.3)
Chloride: 99 mmol/L (ref 96–106)
Creatinine, Ser: 1.12 mg/dL — ABNORMAL HIGH (ref 0.57–1.00)
GFR calc Af Amer: 52 mL/min/{1.73_m2} — ABNORMAL LOW (ref >59)
GFR calc non Af Amer: 45 mL/min/{1.73_m2} — ABNORMAL LOW (ref >59)
GLUCOSE: 100 mg/dL — AB (ref 65–99)
POTASSIUM: 4.2 mmol/L (ref 3.5–5.2)
SODIUM: 141 mmol/L (ref 134–144)

## 2017-03-04 LAB — TSH: TSH: 1.32 u[IU]/mL (ref 0.450–4.500)

## 2017-03-04 LAB — T4, FREE: Free T4: 1.42 ng/dL (ref 0.82–1.77)

## 2017-03-04 LAB — T3, FREE: T3 FREE: 2.4 pg/mL (ref 2.0–4.4)

## 2017-03-10 ENCOUNTER — Ambulatory Visit: Payer: Medicare Other | Admitting: Family Medicine

## 2017-03-11 ENCOUNTER — Ambulatory Visit (INDEPENDENT_AMBULATORY_CARE_PROVIDER_SITE_OTHER): Payer: Medicare Other | Admitting: Interventional Cardiology

## 2017-03-11 ENCOUNTER — Encounter: Payer: Self-pay | Admitting: Interventional Cardiology

## 2017-03-11 ENCOUNTER — Other Ambulatory Visit: Payer: Self-pay | Admitting: Adult Health

## 2017-03-11 VITALS — BP 124/60 | HR 74 | Ht 64.0 in | Wt 121.8 lb

## 2017-03-11 DIAGNOSIS — I6523 Occlusion and stenosis of bilateral carotid arteries: Secondary | ICD-10-CM

## 2017-03-11 DIAGNOSIS — Z954 Presence of other heart-valve replacement: Secondary | ICD-10-CM

## 2017-03-11 DIAGNOSIS — I10 Essential (primary) hypertension: Secondary | ICD-10-CM | POA: Diagnosis not present

## 2017-03-11 DIAGNOSIS — J438 Other emphysema: Secondary | ICD-10-CM

## 2017-03-11 DIAGNOSIS — I480 Paroxysmal atrial fibrillation: Secondary | ICD-10-CM | POA: Diagnosis not present

## 2017-03-11 NOTE — Progress Notes (Signed)
Cardiology Office Note    Date:  03/11/2017   ID:  [name], DOB 02/27/1930, MRN [id number]  PCP:  Mellody Dance, DO  Cardiologist: Sinclair Grooms, MD   Chief Complaint  Patient presents with  . Cardiac Valve Problem  . Shortness of Breath    History of Present Illness:  [name] is a 83 y.o. female who presents for Hypertension, aortic valve bioprosthesis 2012, carotid disease, COPD, and hyperlipidemia.  She does not have cardiac complaints. She is developing progressive shortness of breath. She denies orthopnea. There are no lower extremity swelling issues. Denies chest pain. She has not had hemoptysis. O2 therapy is 24 7.   Past Medical History:  Diagnosis Date  . Allergic rhinitis   . Aortic stenosis   . Carotid artery disease ([place])   . Colon polyps   . h/o Atrial fibrillation ([place]) 03/22/2011   Brief post op   . Hematuria 11/05/2015  . Hypercalcemia   . Hypertension   . Hypothyroid   . Osteopenia   . 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 Pressure urticaria   . RLS (restless legs syndrome)   . S/P aortic valve replacement and aortoplasty 04/13/2011   35m Medtronic Freestyle porcine aortic root   . S/P aortic valve replacement with stentless valve 03/18/2011    Past Surgical History:  Procedure Laterality Date  . AORTIC ROOT REPLACEMENT  03/18/2011   29mMedtronic Freestyle Porcine aortic root with reimplantation of coronary arteries  . BREAST LUMPECTOMY     Left  . CATARACT EXTRACTION    . CHOLECYSTECTOMY    . FOOT SURGERY     Morton's neuroma  . KNEE ARTHROSCOPY     Left    Current Medications: Outpatient Medications Prior to Visit  Medication Sig Dispense Refill  . albuterol (PROAIR HFA) 108 (90 Base) MCG/ACT inhaler Inhale 2 puffs into the lungs every 6 (six) hours as needed for wheezing or shortness of breath. 1 Inhaler 3  . albuterol (PROVENTIL) (2.5 MG/3ML) 0.083% nebulizer solution Take 3 mLs (2.5 mg total) by nebulization every 4  (four) hours as needed for wheezing or shortness of breath. 300 mL 5  . aspirin 81 MG tablet Take 81 mg by mouth daily.    . Marland KitchenuPROPion (WELLBUTRIN) 75 MG tablet one half tablet twice daily 90 tablet 1  . DiphenhydrAMINE HCl (BENADRYL ALLERGY PO) Take 1 capsule by mouth at bedtime.    . Marland Kitchenevothyroxine (SYNTHROID, LEVOTHROID) 112 MCG tablet Take 1 tablet (112 mcg total) by mouth daily. 90 tablet 3  . Melatonin 10 MG CAPS Take 10 mg by mouth at bedtime.    . Multiple Vitamins-Minerals (MULTIVITAMIN WITH MINERALS) tablet Take 1 tablet by mouth daily.    . predniSONE (DELTASONE) 5 MG tablet Take 1 tablet (5 mg total) by mouth daily with breakfast. 90 tablet 1  . simvastatin (ZOCOR) 20 MG tablet TAKE 1 TABLET BY MOUTH AT BEDTIME 90 tablet 0  . STIOLTO RESPIMAT 2.5-2.5 MCG/ACT AERS INHALE 2 PUFFS INTO THE LUNGS DAILY 4 g 5  . amLODipine (NORVASC) 2.5 MG tablet Take 1 tablet (2.5 mg total) by mouth daily. (Patient not taking: Reported on 03/11/2017) 90 tablet 1   No facility-administered medications prior to visit.      Allergies:   Clarithromycin; Tamsulosin; and Penicillins   Social History   Social History  . Marital status: Widowed    Spouse name: N/A  . Number of children: N  . Years of education:  N/A   Occupational History  . housewife    Social History Main Topics  . Smoking status: Former Smoker    Packs/day: 0.90    Years: 62.00    Types: Cigarettes    Quit date: 07/14/2010  . Smokeless tobacco: Never Used  . Alcohol use No  . Drug use: No  . Sexual activity: No   Other Topics Concern  . None   Social History Narrative  . None     Family History:  The patient's family history includes Asthma in her maternal aunt and maternal uncle; Breast cancer in her sister; Cancer in her mother; Emphysema in her maternal aunt; Heart disease in her maternal grandmother; Lung cancer in her father; Uterine cancer in her sister.   ROS:   Please see the history of present illness.      She sees Dr. Kyung Rudd for chronic oxygen requiring COPD. She has occasional palpitation. She has significant shortness of breath with activity. She has been notified that she needs a follow-up Doppler study. Last performed 2016.  All other systems reviewed and are negative.   PHYSICAL EXAM:   VS:  BP 124/60 (BP Location: Left Arm)   Pulse 74   Ht _0  (1.626 m)   Wt 121 lb 12.8 oz (55.2 kg)   BMI 20.91 kg/m    GEN: Well nourished, well developed, in no acute distress . Frail, and chronically ill appearing. HEENT: normal  Neck: no JVD, carotid bruits, or masses Cardiac: RRR; no murmurs, rubs, or gallops,no edema  Respiratory:  clear to auscultation bilaterally, normal work of breathing GI: soft, nontender, nondistended, + BS MS: no deformity or atrophy  Skin: warm and dry, no rash Neuro:  Alert and Oriented x 3, Strength and sensation are intact Psych: euthymic mood, full affect  Wt Readings from Last 3 Encounters:  03/11/17 121 lb 12.8 oz (55.2 kg)  03/03/17 120 lb 12.8 oz (54.8 kg)  02/16/17 122 lb 11.2 oz (55.7 kg)      Studies/Labs Reviewed:   EKG:  EKG  Normal sinus rhythm, right atrial abnormality, nonspecific ST-T wave abnormality, diffuse.when compared to the prior tracings, no significant changes have been noted  Recent Labs: 01/13/2017: ALT 21; Hemoglobin 14.9; Platelets 171 03/03/2017: BUN 14; Creatinine, Ser 1.12; Potassium 4.2; Sodium 141; TSH 1.320   Lipid Panel    Component Value Date/Time   CHOL 157 01/13/2017 1001   TRIG 92 01/13/2017 1001   HDL 78 01/13/2017 1001   CHOLHDL 2.0 01/13/2017 1001   CHOLHDL 2.1 11/08/2015 1011   VLDL 17 11/08/2015 1011   LDLCALC 61 01/13/2017 1001    Additional studies/ records that were reviewed today include:   Carotid Doppler October 2016: ------------------------------------------------------------------- Study Conclusions   - Left ventricle: Abnormal septal motion. The cavity size was   normal. Wall thickness  was normal. Systolic function was normal.   The estimated ejection fraction was in the range of 55% to 60%.   Wall motion was normal; there were no regional wall motion   abnormalities. Doppler parameters are consistent with elevated   ventricular end-diastolic filling pressure. - Aortic valve: Normal appearing tissue AVR. Valve area (VTI): 2.25   cm^2. Valve area (Vmean): 2.26 cm^2. - Atrial septum: No defect or patent foramen ovale was identified.      ASSESSMENT:    1. S/P aortic valve replacement with stentless valve   2. Paroxysmal atrial fibrillation (H[facility])   3. Essential hypertension   4. Bilateral carotid artery  stenosis   5. Other emphysema ([place])      PLAN:  In order of problems listed above:  1. Stable without evidence by auscultation of dysfunction. 2. Sinus rhythm is noted on EKG. Instances of atrial fibrillation were associated with valve replacement. 3. Good control of blood pressure. Current levels are excellent. 4. Soft left carotid bruit is heard. Bilateral carotid Doppler studies are being scheduled 5. Severe, requiring O2 therapy continuously. This is likely her most significant comorbidity.  LDL cholesterol less than 70, continue aspirin daily, clinical follow-up with me in one year.    Medication Adjustments/Labs and Tests Ordered: Current medicines are reviewed at length with the patient today.  Concerns regarding medicines are outlined above.  Medication changes, Labs and Tests ordered today are listed in the Patient Instructions below. There are no Patient Instructions on file for this visit.   Signed, Sinclair Grooms, MD  03/11/2017 11:28 AM    [address], [place], [place]  [address] Phone: [phone]; Fax: [phone]

## 2017-03-11 NOTE — Patient Instructions (Signed)
Medication Instructions:  Your physician recommends that you continue on your current medications as directed. Please refer to the Current Medication list given to you today.   Labwork: None  Testing/Procedures: Your physician has requested that you have a carotid duplex. This test is an ultrasound of the carotid arteries in your neck. It looks at blood flow through these arteries that supply the brain with blood. Allow one hour for this exam. There are no restrictions or special instructions.   Follow-Up: Your physician wants you to follow-up in: 1 year with Dr. Tamala Julian.  You will receive a reminder letter in the mail two months in advance. If you don't receive a letter, please call our office to schedule the follow-up appointment.   Any Other Special Instructions Will Be Listed Below (If Applicable).     If you need a refill on your cardiac medications before your next appointment, please call your pharmacy.

## 2017-03-16 ENCOUNTER — Ambulatory Visit: Payer: Medicare Other | Admitting: Family Medicine

## 2017-03-18 DIAGNOSIS — J449 Chronic obstructive pulmonary disease, unspecified: Secondary | ICD-10-CM | POA: Diagnosis not present

## 2017-03-27 ENCOUNTER — Other Ambulatory Visit: Payer: Self-pay | Admitting: Emergency Medicine

## 2017-04-15 ENCOUNTER — Ambulatory Visit (HOSPITAL_COMMUNITY)
Admission: RE | Admit: 2017-04-15 | Discharge: 2017-04-15 | Disposition: A | Discharge status: Home / Self Care | Payer: Medicare Other | Source: Ambulatory Visit | Attending: Cardiovascular Disease | Admitting: Cardiovascular Disease

## 2017-04-15 DIAGNOSIS — I6523 Occlusion and stenosis of bilateral carotid arteries: Secondary | ICD-10-CM | POA: Diagnosis not present

## 2017-04-16 ENCOUNTER — Ambulatory Visit: Payer: Medicare Other | Admitting: Emergency Medicine

## 2017-04-16 ENCOUNTER — Encounter: Payer: Self-pay | Admitting: Emergency Medicine

## 2017-04-16 DIAGNOSIS — Z9981 Dependence on supplemental oxygen: Secondary | ICD-10-CM

## 2017-04-16 DIAGNOSIS — J438 Other emphysema: Secondary | ICD-10-CM | POA: Diagnosis not present

## 2017-04-16 NOTE — Assessment & Plan Note (Signed)
Chronic hypoxemic respiratory failure.  She is interested in a Marine scientist we will see if she can qualify for the device from [facility].  If unable then we may be able to get her an [employer] device

## 2017-04-16 NOTE — Patient Instructions (Signed)
We will assess your for a portable O2 concentrator today.  Continue your Stiolto daily Take albuterol 2 puffs up to every 4 hours if needed for shortness of breath.  Flu shot up to date.  Follow with Dr Lamonte Sakai in 6 months or sooner if you have any problems

## 2017-04-16 NOTE — Addendum Note (Signed)
Addended by: Rocky Morel D on: 04/16/2017 02:21 PM   Modules accepted: Orders

## 2017-04-16 NOTE — Assessment & Plan Note (Signed)
Severe COPD based on her spirometry but very good functional capacity on her current medical regimen and oxygen.  She is interested in getting a portable oxygen concentrator but tells me that she has been unable to qualify through [facility].  I wonder if this is because she has not desaturated or whether she is unable to trigger pulsed flow.  We will walk her today on our portable concentrator to see if she can qualify.  We will continue Stiolto, albuterol as needed.  Flu shot is up-to-date  We will assess your for a portable O2 concentrator today.  Continue your Stiolto daily Take albuterol 2 puffs up to every 4 hours if needed for shortness of breath.  Flu shot up to date.  Follow with Dr Lamonte Sakai in 6 months or sooner if you have any problems

## 2017-04-16 NOTE — Progress Notes (Signed)
Subjective:    Patient ID: [name], female    DOB: March 14, 1930, 83 y.o.   MRN: [id number]  HPI 83 year old woman, former smoker (50  Pack years), followed by Dr. Gwenette Greet in the past for COPD. Also with a history of hypertension, aortic valve bioprosthesis.   ROV 06/18/16 -- [name] has a history of severe COPD with frequent exacerbations, hypoxemic respiratory failure since May 2017. She had an acute exacerbation in June 2017. She has been managed on stiolto, is on pred 35m daily since about 3 weeks. She uses albuterol much less now that the chronic pred has been stopped. She underwent pulmonary function testing today. Spirometry reveals very severe obstruction, FEV1 0.73 L (42% predicted) w a positive bronchodilator response, borderline hyperinflation on lung volumes, and a decreased diffusion capacity.   ROV 10/19/16 -- This follow-up visit for severe COPD and hypoxemic respiratory failure. Chronic prednisone 5 mg daily. She reports that she is doing well. She does still get dyspnea with exertion. She is active and is able to do yard work. She does house work and shopping. She remains on Stiolto. She rarely needs albuterol. She has not had any flares. Minimal cough. No wheeze. She paces herself. Wears her o2 reliably.   ROV 04/16/17 --83year old former smoker (50 pack years) with severe COPD and associated chronic hypoxemic respiratory failure.  FEV1 0.73 L (42% predicted) with a positive bronchodilator response. She is on chronic prednisone at 5 mg daily. No flares or ED visits reported. She is on Stiolto - had trouble getting it last month from the pharmacy. Rare albuterol use. Sh eis on 2L/min. She is interested in getting an Inogen One POC. [facility]    Review of Systems  Constitutional: Negative for activity change, fever and unexpected weight change.  HENT: Negative for congestion, postnasal drip, rhinorrhea and sneezing.   Respiratory:  Negative for apnea, cough, chest tightness, shortness of breath, wheezing and stridor.    As per HPI      Objective:   Physical Exam Vitals:   04/16/17 1354 04/16/17 1355  BP:  (!) 102/58  Pulse:  80  SpO2:  93%  Weight: 124 lb (56.2 kg)   Height: _0  (1.626 m)    Gen: Pleasant, thin woman,  in no distress,  normal affect  ENT: No lesions,  mouth clear,  oropharynx clear, no postnasal drip  Neck: No JVD, no stridor  Lungs: No use of accessory muscles, distant, no wheezes.   Cardiovascular: RRR, heart sounds normal, no murmur or gallops, no peripheral edema  Musculoskeletal: No deformities, no cyanosis or clubbing  Neuro: alert, non focal  Skin: Warm, no lesions or rashes      Assessment & Plan:  COPD (chronic obstructive pulmonary disease) with emphysema (HCC) Severe COPD based on her spirometry but very good functional capacity on her current medical regimen and oxygen.  She is interested in getting a portable oxygen concentrator but tells me that [facility]  I wonder if this is because she has not desaturated or whether she is unable to trigger pulsed flow.  We will walk her today on our portable concentrator to see if she can qualify.  We will continue Stiolto, albuterol as needed.  Flu shot is up-to-date  We will assess your for a portable O2 concentrator today.  Continue your Stiolto daily Take albuterol 2 puffs up to every 4 hours if  needed for shortness of breath.  Flu shot up to date.  Follow with Dr Lamonte Sakai in 6 months or sooner if you have any problems   Oxygen dependent Chronic hypoxemic respiratory failure.  She is interested in a Marine scientist we will see if she can qualify for the device from [facility].  If unable then we may be able to get her an Inogen device  Baltazar Apo, MD, PhD 04/16/2017, 2:09 PM [facility] [phone] or if no answer [phone]

## 2017-04-17 DIAGNOSIS — J449 Chronic obstructive pulmonary disease, unspecified: Secondary | ICD-10-CM | POA: Diagnosis not present

## 2017-04-23 ENCOUNTER — Other Ambulatory Visit: Payer: Self-pay | Admitting: Emergency Medicine

## 2017-05-14 ENCOUNTER — Encounter: Payer: Self-pay | Admitting: Family Medicine

## 2017-05-14 NOTE — Addendum Note (Signed)
Addended by: Marjory Sneddon on: 05/14/2017 04:46 PM   Modules accepted: Level of Service

## 2017-05-18 DIAGNOSIS — J449 Chronic obstructive pulmonary disease, unspecified: Secondary | ICD-10-CM | POA: Diagnosis not present

## 2017-06-17 ENCOUNTER — Other Ambulatory Visit: Payer: Self-pay | Admitting: Family Medicine

## 2017-06-18 DIAGNOSIS — J449 Chronic obstructive pulmonary disease, unspecified: Secondary | ICD-10-CM | POA: Diagnosis not present

## 2017-06-25 DIAGNOSIS — H5202 Hypermetropia, left eye: Secondary | ICD-10-CM | POA: Diagnosis not present

## 2017-06-25 DIAGNOSIS — H524 Presbyopia: Secondary | ICD-10-CM | POA: Diagnosis not present

## 2017-06-25 DIAGNOSIS — H52223 Regular astigmatism, bilateral: Secondary | ICD-10-CM | POA: Diagnosis not present

## 2017-07-05 ENCOUNTER — Other Ambulatory Visit: Payer: Self-pay | Admitting: Family Medicine

## 2017-07-05 DIAGNOSIS — J438 Other emphysema: Secondary | ICD-10-CM

## 2017-07-07 ENCOUNTER — Encounter: Payer: Self-pay | Admitting: Family Medicine

## 2017-07-07 ENCOUNTER — Ambulatory Visit: Payer: Medicare Other | Admitting: Family Medicine

## 2017-07-07 VITALS — BP 129/61 | HR 73 | Ht 64.02 in | Wt 125.0 lb

## 2017-07-07 DIAGNOSIS — M810 Age-related osteoporosis without current pathological fracture: Secondary | ICD-10-CM | POA: Diagnosis not present

## 2017-07-07 DIAGNOSIS — R12 Heartburn: Secondary | ICD-10-CM | POA: Diagnosis not present

## 2017-07-07 DIAGNOSIS — Z780 Asymptomatic menopausal state: Secondary | ICD-10-CM | POA: Insufficient documentation

## 2017-07-07 DIAGNOSIS — Z8049 Family history of malignant neoplasm of other genital organs: Secondary | ICD-10-CM | POA: Insufficient documentation

## 2017-07-07 DIAGNOSIS — M8580 Other specified disorders of bone density and structure, unspecified site: Secondary | ICD-10-CM | POA: Diagnosis not present

## 2017-07-07 DIAGNOSIS — F17200 Nicotine dependence, unspecified, uncomplicated: Secondary | ICD-10-CM | POA: Insufficient documentation

## 2017-07-07 DIAGNOSIS — E038 Other specified hypothyroidism: Secondary | ICD-10-CM

## 2017-07-07 DIAGNOSIS — Z5181 Encounter for therapeutic drug level monitoring: Secondary | ICD-10-CM | POA: Insufficient documentation

## 2017-07-07 DIAGNOSIS — J438 Other emphysema: Secondary | ICD-10-CM | POA: Diagnosis not present

## 2017-07-07 DIAGNOSIS — Z8052 Family history of malignant neoplasm of bladder: Secondary | ICD-10-CM | POA: Insufficient documentation

## 2017-07-07 DIAGNOSIS — Z87891 Personal history of nicotine dependence: Secondary | ICD-10-CM

## 2017-07-07 DIAGNOSIS — Z9981 Dependence on supplemental oxygen: Secondary | ICD-10-CM | POA: Diagnosis not present

## 2017-07-07 DIAGNOSIS — Z954 Presence of other heart-valve replacement: Secondary | ICD-10-CM | POA: Diagnosis not present

## 2017-07-07 DIAGNOSIS — R079 Chest pain, unspecified: Secondary | ICD-10-CM | POA: Diagnosis not present

## 2017-07-07 DIAGNOSIS — G479 Sleep disorder, unspecified: Secondary | ICD-10-CM | POA: Diagnosis not present

## 2017-07-07 DIAGNOSIS — Z7952 Long term (current) use of systemic steroids: Secondary | ICD-10-CM | POA: Diagnosis not present

## 2017-07-07 DIAGNOSIS — I10 Essential (primary) hypertension: Secondary | ICD-10-CM | POA: Diagnosis not present

## 2017-07-07 DIAGNOSIS — Z801 Family history of malignant neoplasm of trachea, bronchus and lung: Secondary | ICD-10-CM | POA: Insufficient documentation

## 2017-07-07 DIAGNOSIS — Z803 Family history of malignant neoplasm of breast: Secondary | ICD-10-CM

## 2017-07-07 MED ORDER — OMEPRAZOLE 20 MG PO CPDR: DELAYED_RELEASE_CAPSULE | ORAL | 11 refills | Status: DC

## 2017-07-07 MED ORDER — CALCIUM CARBONATE-VITAMIN D 600-400 MG-UNIT PO TABS
1.0000 | ORAL_TABLET | Freq: Two times a day (BID) | ORAL | 11 refills | Status: DC
Start: 2017-07-07 — End: 2017-07-08

## 2017-07-07 MED ORDER — TRAZODONE HCL 50 MG PO TABS: 50.0000 mg | ORAL_TABLET | Freq: Every evening | ORAL | 0 refills | Status: DC | PRN

## 2017-07-07 NOTE — Progress Notes (Signed)
Impression and Recommendations:    1. Chest pain in adult- non -specific   2. S/P aortic valve replacement with stentless valve   3. Essential hypertension   4. Other emphysema ([place])   5. Osteopenia after menopause   6. Current chronic use of systemic steroids   7. History of smoking   ( 43 yrs at 0.5ppd) ;1991- until 2012 ( 8 cig/day )     8. Osteopenia, unspecified location   9. Heartburn   10. Sleep disorder   11. Oxygen dependent   12. Other specified hypothyroidism   13. Family history of breast cancer in sister     1. Osteopenia after menopause- Last bone density 03-2016, pt is due 03-2018.  2. Current chronic use of systemic steroids- ensure bone density q2 years, UTD. h/o osteopinea, pt knows to take calcium and vitamin D daily. She will call us and let us know her current supplement dose.  3. FMHx breast CA- pt is past due for mammogram this year. [facility] as she is due this month. This is very important due to her positive family history.  4. Other emphysema- pt uses oxygen. Stable at this time. Followd closely by Dr. Lamonte Sakai.  5. H/o smoking- pt takes wellbutrin PRN when she has the urge to smoke a cigarette. She denies any side effects from taking the meds PRN. Sx stable at this time. Continue this as you are asymptomatic.  6. Osteopenia-pt is past due for mammogram this year. Call [facility] as she is due this month. This is very important due to her positive family history.  7. Essential HTN:   -Pt is tolerating her meds well without complication. Sx stable. BP well  controlled. Continue checking your BP at home and bring a log into  next OV.  8. CP-  - ekg in office w/o change from prior. Will send to pt's Cards doc as  Well for their review  -if these symptoms arise when you are exerting yourself, call 911   immediately.   Likely these sx are GERD due to hx and nature of them.  -Pt prefers to hold off on cardiology referral.  If her symptoms have not  improved after her treatment for reflux, she agrees to go to cardiology  future for further evaluation at that time.  9. Heart burn: -see if certain foods exacerbate your symptoms like citrus, tomato foods, spicy foods. Do not eat or drink 2 hours before bed at night.  -start prilosec as listed below.  - H. pylori stool test.   Do this test before starting prilosec.  10. Sleep disorder-     - start trazodone for sleep.   - Do not take benadryl or antihistamines with this.   -You may take up to 5-73m melatonin in addition, if needed, for sleep.  11. Oxygen dependent-   -pt uses oxygen at home and now has a portal oxygen machine. Sx stable at this time   12. Other specified hypothyroidism-order labs  -follow up in 4 weeks after starting trazodone and prilosec. Bring in stool sample ASAP.    Orders Placed This Encounter  Procedures  . DG Bone Density  . Comprehensive metabolic panel  . TSH+T4F+T3Free  . H. pylori antigen, stool  . Magnesium  . Phosphorus  . EKG 12-Lead    Meds ordered this encounter  Medications  . omeprazole (PRILOSEC) 20 MG capsule    Sig: Take 1 tablet twice daily before meals  for 1 month, then go to 1 tablet before your largest meal of the day    Dispense:  30 capsule    Refill:  11  . traZODone (DESYREL) 50 MG tablet    Sig: Take 1 tablet (50 mg total) by mouth at bedtime as needed for sleep.    Dispense:  90 tablet    Refill:  0  . Calcium Carbonate-Vitamin D 600-400 MG-UNIT tablet    Sig: Take 1 tablet by mouth 2 (two) times daily.    Dispense:  60 tablet    Refill:  11    Gross side effects, risk and benefits, and alternatives of medications and treatment plan in general discussed with patient.  Patient is aware that all medications have potential side effects and we are unable to predict every side effect or drug-drug interaction that may occur.   Patient will call with any questions prior to using medication if they  have concerns.  Expresses verbal understanding and consents to current therapy and treatment regimen.  No barriers to understanding were identified.  Red flag symptoms and signs discussed in detail.  Patient expressed understanding regarding what to do in case of emergency\urgent symptoms  Please see AVS handed out to patient at the end of our visit for further patient instructions/ counseling done pertaining to today's office visit.   Return in about 4 weeks (around 08/04/2017) for 4wks - f/up for reflux new med and new sleep med; bring in stool sample ASAP.    Note: This note was prepared with assistance of Dragon voice recognition software. Occasional wrong-word or sound-a-like substitutions may have occurred due to the inherent limitations of voice recognition software.   Pt was in the office today for 60+ minutes, with over 50% time spent in face to face counseling of patients various medical conditions, treatment plans of those medical conditions including medicine management and lifestyle modification, strategies to improve health and well being; and in coordination of care. SEE ABOVE FOR DETAILS   This document serves as a record of services personally performed by Mellody Dance, DO. It was created on her behalf by Mayer Masker, a trained medical scribe. The creation of this record is based on the scribe's personal observations and the provider's statements to them.    I have reviewed the above medical documentation for accuracy and completeness and I concur.  Mellody Dance 07/07/17 6:01 PM  ----------------------------------------------------------------------------------------------------------------------------------------------------------------------------------------------------------   Subjective:     HPI: [name] is a 84 y.o. female who presents to [place] [facility] today for issues as discussed below.  -Pt has a portable oxygen machine now  that allows her to be away from the house for 10 hours at a time. She only uses it when she is not at home.   -She has someone who comes to her house to clean for her.    Sleep: She states she has not been getting enough sleep recently. She is not watching TV at night. It is not related to thinking too much, she just cannot sleep. She states she sleeps better on her couch than in her bedroom. She uses a wave machine in her bedroom. She lies on her back and is propped up. She tries to take 50m melatonin and antihistamine before sleep but no relief. She tried to stop taking this a week prior, but her symptoms did not allow her to sleep.   HTN: Pt checks her BP 2-3 times a week. She sits  quietly with her feet on the floor before checking it.   Osteopenia Last bone density 03-2016, pt is due 03-2018.  Breast Her last mammogram was 06-26-2016. Her mammogram is past due this year.  FMHx CA: She has a FMHx breast CA with her 2 sisters, double mastectomy. Sister (uterine CA)  Dad (lung CA) Mom (bladder CA)  Smoking cessation: pt is not smoking at all, not even e-cigarettes. After taking her Wellbutrin, she does not have the urge anymore. She states her mood has not really changed after taking it. She does not take it daily, but only takes it when she feels she wants a cigarette (2-3 times a week). She states when she took it daily, her shakes were worse. She has a h/o tremors. She denies side effects when she takes it PRN.   CP: She is having CP across her chest that radiates into abdomen pain, and into her R arm. This has been going on "for a long time". The pain comes on when she lies down and thinks it is reflux. Her pain is not exertional in nature. It is not connected to food, but she states she nibbles on peanuts at night before bed. She does not take ibuprofen, but only takes tylenol, when she needs it. Her symptoms are improved after taking 4-5 of them throughout the day. She only takes 2-3  of them at a time. She drank a small amount of vinegar which made her symptoms worse. This has been going on for weeks. She denies current CP. She is going to see her cardiologist, Dr. Tamala Julian, in the near future. She takes a baby aspirin daily. She denies nausea, diaphoresis, dizziness, leg swelling, and SOB beyond baseline. She drinks caffeine-free tea at night.     Wt Readings from Last 3 Encounters:  07/07/17 125 lb (56.7 kg)  04/16/17 124 lb (56.2 kg)  03/11/17 121 lb 12.8 oz (55.2 kg)   BP Readings from Last 3 Encounters:  07/07/17 129/61  04/16/17 (!) 102/58  03/11/17 124/60   Pulse Readings from Last 3 Encounters:  07/07/17 73  04/16/17 80  03/11/17 74   BMI Readings from Last 3 Encounters:  07/07/17 21.45 kg/m  04/16/17 21.28 kg/m  03/11/17 20.91 kg/m     Patient Care Team    Relationship Specialty Notifications Start End  Mellody Dance, DO PCP - General Family Medicine  10/08/15   Rexene Alberts, MD  Cardiothoracic Surgery  03/05/11   Belva Crome, MD  Cardiology  03/05/11   Collene Gobble, MD Consulting Physician Pulmonary Disease  11/05/15   Magdalen Spatz, NP Nurse Practitioner Pulmonary Disease  11/05/15      Patient Active Problem List   Diagnosis Date Noted  . History of smoking   ( 43 yrs at 0.5ppd) ;1991- until 2012 ( 8 cig/day )   02/09/2017    Priority: High  . S/P aortic valve replacement with stentless valve 03/18/2011    Priority: High  . Carotid artery disease ([place])     Priority: High  . Hypertension     Priority: High  . COPD (chronic obstructive pulmonary disease) with emphysema ([place]) 03/02/2011    Priority: High  . Osteopenia 11/05/2015    Priority: Medium  . h/o Intention tremor  11/05/2015    Priority: Medium  . Medication monitoring encounter 07/07/2017  . Nicotine dependence with current use 07/07/2017  . Family history of breast cancer in sister 07/07/2017  . Family history of uterine  cancer-sister 07/07/2017  . Family  history of bladder cancer-in mother 07/07/2017  . Family history of lung cancer- father 07/07/2017  . Osteopenia after menopause 07/07/2017  . Chest pain in adult- non -specific 07/07/2017  . Current chronic use of systemic steroids 07/07/2017  . Heartburn 07/07/2017  . Dysthymia 02/09/2017  . Medication management 03/11/2016  . Dyspnea on exertion 03/11/2016  . Sleep disorder 12/11/2015  . Elevated hemoglobin A1c 11/13/2015  . Vitamin deficiency 11/13/2015  . 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 Allergic rhinitis 11/05/2015  . Hematuria 11/05/2015  . Hypothyroidism; on synthroid 11/05/2015  . h/o Hypercalcemia 11/05/2015  . h/o RLS (restless legs syndrome) 11/05/2015  . History of nephrolithiasis 11/05/2015  . S/P aortic valve replacement and aortoplasty 04/13/2011  . h/o Atrial fibrillation ([place]) 03/22/2011    Past Medical history, Surgical history, Family history, Social history, Allergies and Medications have been entered into the medical record, reviewed and changed as needed.    Current Meds  Medication Sig  . albuterol (PROAIR HFA) 108 (90 Base) MCG/ACT inhaler Inhale 2 puffs into the lungs every 6 (six) hours as needed for wheezing or shortness of breath.  Marland Kitchen albuterol (PROVENTIL) (2.5 MG/3ML) 0.083% nebulizer solution Take 3 mLs (2.5 mg total) by nebulization every 4 (four) hours as needed for wheezing or shortness of breath.  Marland Kitchen amLODipine (NORVASC) 2.5 MG tablet Take half (1/2) tablet (1.25 mg) by mouth daily.  Marland Kitchen aspirin 81 MG tablet Take 81 mg by mouth daily.  Marland Kitchen buPROPion (WELLBUTRIN) 75 MG tablet one half tablet twice daily  . Multiple Vitamins-Minerals (MULTIVITAMIN WITH MINERALS) tablet Take 1 tablet by mouth daily.  . predniSONE (DELTASONE) 5 MG tablet TAKE 1 TABLET BY MOUTH ONCE DAILY WITH BREAKFAST  . simvastatin (ZOCOR) 20 MG tablet TAKE 1 TABLET BY MOUTH AT BEDTIME  . STIOLTO RESPIMAT 2.5-2.5 MCG/ACT AERS INHALE 2 PUFFS INTO THE LUNGS DAILY  . [DISCONTINUED] Melatonin  10 MG CAPS Take 10 mg by mouth at bedtime.    Allergies:  Allergies  Allergen Reactions  . Clarithromycin     Mouth/throat sore  . Tamsulosin Itching  . Penicillins Itching and Rash    Review of Systems:  A fourteen system review of systems was performed and found to be positive as per HPI.   Objective:   Blood pressure 129/61, pulse 73, height 5' 4.02" (1.626 m), weight 125 lb (56.7 kg), SpO2 97 %. Body mass index is 21.45 kg/m. General:  Well Developed, well nourished, appropriate for stated age.  Neuro:  Alert and oriented,  extra-ocular muscles intact  HEENT:  Normocephalic, atraumatic, neck supple, no carotid bruits appreciated  Skin:  no gross rash, warm, pink. Cardiac:  RRR, S1 S2. Unchanged from prior 03-11-17. Respiratory:  ECTA B/L and A/P, Not using accessory muscles, speaking in full sentences- unlabored. Vascular:  Ext warm, no cyanosis apprec.; cap RF less 2 sec. Psych:  No HI/SI, judgement and insight good, Euthymic mood. Full Affect.

## 2017-07-07 NOTE — Patient Instructions (Addendum)
Since she would like to hold off on the cardiology referral at this time, we will hold off until I see you again in 4-6 weeks.  If you are still having the symptoms and they have not gone away with the treatment for the reflux, then you agree to go to cardiology.  Or if you develop any chest pains or chest tightness with activity that is relieved with rest you will let me know immediately if you develop those new symptoms-actually you are going to dial 911 if you get those symptoms  -We will start you on trazodone for your sleep.  Please do not take the Benadryl or antihistamines but you may still take melatonin up to 5-10 mg nightly if needed.  I would rather you just take the trazodone though alone   Please do call [facility] for your mammogram as you are due this month since it was a year ago today.  This is a very important you do this since you have a positive family history  - Also for your reflux I want you to see if certain foods make it worse such as citrus, "tomatoey" foods, spicy foods etc.   - Also I do not want you to eat or drink within 2-3 hours of lying down at night even water.    Food Choices for Gastroesophageal Reflux Disease, Adult When you have gastroesophageal reflux disease (GERD), the foods you eat and your eating habits are very important. Choosing the right foods can help ease the discomfort of GERD. Consider working with a diet and nutrition specialist (dietitian) to help you make healthy food choices. What general guidelines should I follow? Eating plan  Choose healthy foods low in fat, such as fruits, vegetables, whole grains, low-fat dairy products, and lean meat, fish, and poultry.  Eat frequent, small meals instead of three large meals each day. Eat your meals slowly, in a relaxed setting. Avoid bending over or lying down until 2-3 hours after eating.  Limit high-fat foods such as fatty meats or fried foods.  Limit your intake of oils, butter, and  shortening to less than 8 teaspoons each day.  Avoid the following: ? Foods that cause symptoms. These may be different for different people. Keep a food diary to keep track of foods that cause symptoms. ? Alcohol. ? Drinking large amounts of liquid with meals. ? Eating meals during the 2-3 hours before bed.  Cook foods using methods other than frying. This may include baking, grilling, or broiling. Lifestyle   Maintain a healthy weight. Ask your health care provider what weight is healthy for you. If you need to lose weight, work with your health care provider to do so safely.  Exercise for at least 30 minutes on 5 or more days each week, or as told by your health care provider.  Avoid wearing clothes that fit tightly around your waist and chest.  Do not use any products that contain nicotine or tobacco, such as cigarettes and e-cigarettes. If you need help quitting, ask your health care provider.  Sleep with the head of your bed raised. Use a wedge under the mattress or blocks under the bed frame to raise the head of the bed. What foods are not recommended? The items listed may not be a complete list. Talk with your dietitian about what dietary choices are best for you. Grains Pastries or quick breads with added fat. [place] toast. Vegetables Deep fried vegetables. [place] fries. Any vegetables prepared with added  fat. Any vegetables that cause symptoms. For some people this may include tomatoes and tomato products, chili peppers, onions and garlic, and horseradish. Fruits Any fruits prepared with added fat. Any fruits that cause symptoms. For some people this may include citrus fruits, such as oranges, grapefruit, pineapple, and lemons. Meats and other protein foods High-fat meats, such as fatty beef or pork, hot dogs, ribs, ham, sausage, salami and bacon. Fried meat or protein, including fried fish and fried chicken. Nuts and nut butters. Dairy Whole milk and chocolate milk. Sour  cream. Cream. Ice cream. Cream cheese. Milk shakes. Beverages Coffee and tea, with or without caffeine. Carbonated beverages. Sodas. Energy drinks. Fruit juice made with acidic fruits (such as orange or grapefruit). Tomato juice. Alcoholic drinks. Fats and oils Butter. Margarine. Shortening. Ghee. Sweets and desserts Chocolate and cocoa. Donuts. Seasoning and other foods Pepper. Peppermint and spearmint. Any condiments, herbs, or seasonings that cause symptoms. For some people, this may include curry, hot sauce, or vinegar-based salad dressings. Summary  When you have gastroesophageal reflux disease (GERD), food and lifestyle choices are very important to help ease the discomfort of GERD.  Eat frequent, small meals instead of three large meals each day. Eat your meals slowly, in a relaxed setting. Avoid bending over or lying down until 2-3 hours after eating.  Limit high-fat foods such as fatty meat or fried foods. This information is not intended to replace advice given to you by your health care provider. Make sure you discuss any questions you have with your health care provider. Document Released: 05/04/2005 Document Revised: 05/05/2016 Document Reviewed: 05/05/2016 Elsevier Interactive Patient Education  [employer].

## 2017-07-08 LAB — COMPREHENSIVE METABOLIC PANEL
A/G RATIO: 1.4 (ref 1.2–2.2)
ALK PHOS: 49 IU/L (ref 39–117)
ALT: 26 IU/L (ref 0–32)
AST: 23 IU/L (ref 0–40)
Albumin: 4.1 g/dL (ref 3.5–4.7)
BUN/Creatinine Ratio: 17 (ref 12–28)
BUN: 19 mg/dL (ref 8–27)
Bilirubin Total: 0.4 mg/dL (ref 0.0–1.2)
CALCIUM: 10.5 mg/dL — AB (ref 8.7–10.3)
CO2: 32 mmol/L — AB (ref 20–29)
Chloride: 98 mmol/L (ref 96–106)
Creatinine, Ser: 1.1 mg/dL — ABNORMAL HIGH (ref 0.57–1.00)
GFR calc Af Amer: 53 mL/min/{1.73_m2} — ABNORMAL LOW (ref >59)
GFR, EST NON AFRICAN AMERICAN: 46 mL/min/{1.73_m2} — AB (ref >59)
GLOBULIN, TOTAL: 2.9 g/dL (ref 1.5–4.5)
Glucose: 94 mg/dL (ref 65–99)
POTASSIUM: 4 mmol/L (ref 3.5–5.2)
SODIUM: 144 mmol/L (ref 134–144)
Total Protein: 7 g/dL (ref 6.0–8.5)

## 2017-07-08 LAB — TSH+T4F+T3FREE
Free T4: 1.45 ng/dL (ref 0.82–1.77)
T3 FREE: 3.2 pg/mL (ref 2.0–4.4)
TSH: 0.825 u[IU]/mL (ref 0.450–4.500)

## 2017-07-08 LAB — PHOSPHORUS: Phosphorus: 3.1 mg/dL (ref 2.5–4.5)

## 2017-07-08 LAB — MAGNESIUM: Magnesium: 2.1 mg/dL (ref 1.6–2.3)

## 2017-07-08 NOTE — Addendum Note (Signed)
Addended by: Fonnie Mu on: 07/08/2017 01:30 PM   Modules accepted: Orders

## 2017-07-12 ENCOUNTER — Other Ambulatory Visit: Payer: Self-pay

## 2017-07-12 ENCOUNTER — Emergency Department (HOSPITAL_COMMUNITY): Payer: Medicare Other

## 2017-07-12 ENCOUNTER — Emergency Department (HOSPITAL_COMMUNITY)
Admission: EM | Admit: 2017-07-12 | Discharge: 2017-07-12 | Disposition: A | Discharge status: Home / Self Care | Payer: Medicare Other | Attending: Emergency Medicine | Admitting: Emergency Medicine

## 2017-07-12 ENCOUNTER — Encounter (HOSPITAL_COMMUNITY): Payer: Self-pay | Admitting: *Deleted

## 2017-07-12 DIAGNOSIS — R635 Abnormal weight gain: Secondary | ICD-10-CM | POA: Insufficient documentation

## 2017-07-12 DIAGNOSIS — R079 Chest pain, unspecified: Secondary | ICD-10-CM | POA: Diagnosis not present

## 2017-07-12 DIAGNOSIS — R531 Weakness: Secondary | ICD-10-CM | POA: Diagnosis not present

## 2017-07-12 DIAGNOSIS — R2243 Localized swelling, mass and lump, lower limb, bilateral: Secondary | ICD-10-CM | POA: Insufficient documentation

## 2017-07-12 DIAGNOSIS — Z5321 Procedure and treatment not carried out due to patient leaving prior to being seen by health care provider: Secondary | ICD-10-CM | POA: Diagnosis not present

## 2017-07-12 DIAGNOSIS — R0602 Shortness of breath: Secondary | ICD-10-CM | POA: Insufficient documentation

## 2017-07-12 LAB — BASIC METABOLIC PANEL
Anion gap: 8 (ref 5–15)
BUN: 19 mg/dL (ref 6–20)
CALCIUM: 9.8 mg/dL (ref 8.9–10.3)
CHLORIDE: 99 mmol/L — AB (ref 101–111)
CO2: 31 mmol/L (ref 22–32)
CREATININE: 1.01 mg/dL — AB (ref 0.44–1.00)
GFR calc non Af Amer: 49 mL/min — ABNORMAL LOW (ref >60)
GFR, EST AFRICAN AMERICAN: 57 mL/min — AB (ref >60)
GLUCOSE: 107 mg/dL — AB (ref 65–99)
Potassium: 4.4 mmol/L (ref 3.5–5.1)
Sodium: 138 mmol/L (ref 135–145)

## 2017-07-12 LAB — CBC
HCT: 43.1 % (ref 36.0–46.0)
Hemoglobin: 14 g/dL (ref 12.0–15.0)
MCH: 32.5 pg (ref 26.0–34.0)
MCHC: 32.5 g/dL (ref 30.0–36.0)
MCV: 100 fL (ref 78.0–100.0)
Platelets: 153 K/uL (ref 150–400)
RBC: 4.31 MIL/uL (ref 3.87–5.11)
RDW: 12.9 % (ref 11.5–15.5)
WBC: 7 K/uL (ref 4.0–10.5)

## 2017-07-12 LAB — I-STAT TROPONIN, ED: TROPONIN I, POC: 0.01 ng/mL (ref 0.00–0.08)

## 2017-07-12 NOTE — ED Triage Notes (Addendum)
Pt reports that she started feeling bad on Saturday. Pt feels unsteady and weak, has sob with mild exertion and swelling to her legs. Went to pcp office and sent here for further eval of possible CHF or PE. orthstatics were done at pcp and they reported spo2 low when lying down. Also reports recent weight gain and also started new medications for sleep.

## 2017-07-14 ENCOUNTER — Telehealth: Payer: Self-pay | Admitting: Interventional Cardiology

## 2017-07-14 ENCOUNTER — Other Ambulatory Visit: Payer: Self-pay | Admitting: Family Medicine

## 2017-07-14 DIAGNOSIS — A048 Other specified bacterial intestinal infections: Secondary | ICD-10-CM

## 2017-07-14 LAB — H. PYLORI ANTIGEN, STOOL: H PYLORI AG STL: POSITIVE — AB

## 2017-07-14 MED ORDER — METRONIDAZOLE 500 MG PO TABS: 250.0000 mg | ORAL_TABLET | Freq: Four times a day (QID) | ORAL | 0 refills | Status: DC

## 2017-07-14 MED ORDER — BISMUTH SUBSALICYLATE 262 MG PO CHEW
262.0000 mg | CHEWABLE_TABLET | Freq: Three times a day (TID) | ORAL | 0 refills | Status: DC
Start: 2017-07-14 — End: 2017-10-19

## 2017-07-14 MED ORDER — OMEPRAZOLE 20 MG PO CPDR: 20.0000 mg | DELAYED_RELEASE_CAPSULE | Freq: Two times a day (BID) | ORAL | 0 refills | Status: DC

## 2017-07-14 MED ORDER — TETRACYCLINE HCL 250 MG PO CAPS: 250.0000 mg | ORAL_CAPSULE | Freq: Four times a day (QID) | ORAL | 0 refills | Status: DC

## 2017-07-14 NOTE — Telephone Encounter (Signed)
Spoke with Dr. Raliegh Scarlet and she states that family is very concerned about pt having some lower extremity swelling and being more SOB.  Pt seen by Dr. Raliegh Scarlet after these complaints and she states swelling was not that bad and she was fine with pt being seen in 2 weeks but family very concerned and wanted sooner appt.  Was advised to reach out to son to change the appt.  Called son and left message to call back.

## 2017-07-14 NOTE — Telephone Encounter (Signed)
Pt is scheduled to see Pecolia Ades, NP on 3/4.

## 2017-07-14 NOTE — Telephone Encounter (Signed)
New Patient   Patients PCP Dr. Raliegh Scarlet is calling on behalf of patient. She states that the patients family is wanting her to see about trying to get the patient seen earlier due to her swelling. The PCP feels like the patient will be okay but she is just inquiring based on the family. Please call to discuss.

## 2017-07-15 DIAGNOSIS — Z803 Family history of malignant neoplasm of breast: Secondary | ICD-10-CM | POA: Diagnosis not present

## 2017-07-15 DIAGNOSIS — Z1231 Encounter for screening mammogram for malignant neoplasm of breast: Secondary | ICD-10-CM | POA: Diagnosis not present

## 2017-07-16 DIAGNOSIS — J449 Chronic obstructive pulmonary disease, unspecified: Secondary | ICD-10-CM | POA: Diagnosis not present

## 2017-07-18 NOTE — Progress Notes (Signed)
Cardiology Office Note:    Date:  07/19/2017   ID:  ZYAIRA VEJAR, DOB 1929/11/09, MRN [id number]  PCP:  Mellody Dance, DO  Cardiologist:  Sinclair Grooms, MD  Referring MD: Mellody Dance, DO   Chief Complaint  Patient presents with  . Chest Pain    follow up    History of Present Illness:    [name] is a 84 y.o. female with a past medical history significant for hypertension, aortic valve bioprosthesis 2012, carotid artery disease, COPD on home oxygen  and hyperlipidemia.  She had postoperative atrial fibrillation in 2012 with no recurrences.  She was seen by her PCP on 07/07/2017 with complaints of chest pain.  EKG was unchanged and her symptoms were felt to be related to GERD.  She was advised on dietary changes and she tested positive Positive for H. pylori in the stool. She was started on Prilosec and bismuth subsalicylate, flagyl. Her chest pain is fully resolved since starting therapy. Has chronic dyspnea on exertion when she has to walk a distance, on chronic oxygen, reports that she is at baseline. She is sleeping with head elevated due to reflux, but denies orthopnea. She denies edema. She had a wt increase from 125 to 130 lbs per home scale but is back to 125 lbs this morning at home.  No palpitations, fluttering.   Chest x-ray on 07/12/2017 did not show any focal pneumonia or pulmonary edema.  Troponin was 0.01.  She quit smoking cigarettes in 2012, used an e cigarette for a while but is now non-smoking with the aid of Wellbutrin.    Past Medical History:  Diagnosis Date  . Allergic rhinitis   . Aortic stenosis   . Carotid artery disease ([place])   . Colon polyps   . h/o Atrial fibrillation ([place]) 03/22/2011   Brief post op   . Hematuria 11/05/2015  . Hypercalcemia   . Hypertension   . Hypothyroid   . Osteopenia   . 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 Pressure urticaria   . RLS (restless legs syndrome)   . S/P aortic valve replacement and aortoplasty 04/13/2011    57m Medtronic Freestyle porcine aortic root   . S/P aortic valve replacement with stentless valve 03/18/2011    Past Surgical History:  Procedure Laterality Date  . AORTIC ROOT REPLACEMENT  03/18/2011   244mMedtronic Freestyle Porcine aortic root with reimplantation of coronary arteries  . BREAST LUMPECTOMY     Left  . CATARACT EXTRACTION    . CHOLECYSTECTOMY    . FOOT SURGERY     Morton's neuroma  . KNEE ARTHROSCOPY     Left    Current Medications: Current Meds  Medication Sig  . albuterol (PROAIR HFA) 108 (90 Base) MCG/ACT inhaler Inhale 2 puffs into the lungs every 6 (six) hours as needed for wheezing or shortness of breath.  . Marland KitchenmLODipine (NORVASC) 2.5 MG tablet Take half (1/2) tablet (1.25 mg) by mouth daily.  . Marland Kitchenspirin 81 MG tablet Take 81 mg by mouth daily.  . Marland Kitchenismuth subsalicylate (PEPTO-BISMOL) 262 MG chewable tablet Chew 1 tablet (262 mg total) by mouth 4 (four) times daily -  before meals and at bedtime.  . Marland KitchenuPROPion (WELLBUTRIN) 75 MG tablet one half tablet twice daily  . Calcium Carbonate-Vitamin D3 600-400 MG-UNIT TABS Take by mouth 2 (two) times daily.  . Cholecalciferol (VITAMIN D3) 5000 units CAPS Take 1 capsule by mouth daily.  . Marland Kitchenevothyroxine (SYNTHROID, LEVOTHROID) 112 MCG tablet Take  1 tablet (112 mcg total) by mouth daily.  . metroNIDAZOLE (FLAGYL) 500 MG tablet Take 0.5 tablets (250 mg total) by mouth 4 (four) times daily.  . Multiple Vitamins-Minerals (MULTIVITAMIN WITH MINERALS) tablet Take 1 tablet by mouth daily.  Marland Kitchen omeprazole (PRILOSEC) 20 MG capsule Take 1 tablet twice daily before meals for 1 month, then go to 1 tablet before your largest meal of the day  . omeprazole (PRILOSEC) 20 MG capsule Take 1 capsule (20 mg total) by mouth 2 (two) times daily before a meal. (then back to her 62mQD after 2 wk)  . predniSONE (DELTASONE) 5 MG tablet TAKE 1 TABLET BY MOUTH ONCE DAILY WITH BREAKFAST  . simvastatin (ZOCOR) 20 MG tablet TAKE 1 TABLET BY MOUTH AT  BEDTIME  . STIOLTO RESPIMAT 2.5-2.5 MCG/ACT AERS INHALE 2 PUFFS INTO THE LUNGS DAILY  . tetracycline (ACHROMYCIN,SUMYCIN) 250 MG capsule Take 1 capsule (250 mg total) by mouth 4 (four) times daily.     Allergies:   Clarithromycin; Tamsulosin; and Penicillins   Social History   Socioeconomic History  . Marital status: Widowed    Spouse name: None  . Number of children: N  . Years of education: None  . Highest education level: None  Social Needs  . Financial resource strain: None  . Food insecurity - worry: None  . Food insecurity - inability: None  . Transportation needs - medical: None  . Transportation needs - non-medical: None  Occupational History  . Occupation: housewife  Tobacco Use  . Smoking status: Former Smoker    Packs/day: 0.90    Years: 62.00    Pack years: 55.80    Types: Cigarettes    Last attempt to quit: 07/14/2010    Years since quitting: 7.0  . Smokeless tobacco: Never Used  Substance and Sexual Activity  . Alcohol use: No    Alcohol/week: 0.0 oz  . Drug use: No  . Sexual activity: No  Other Topics Concern  . None  Social History Narrative  . None     Family History: The patient's family history includes Asthma in her maternal aunt and maternal uncle; Breast cancer in her sister; Cancer in her mother; Emphysema in her maternal aunt; Heart disease in her maternal grandmother; Lung cancer in her father; Uterine cancer in her sister. ROS:   Please see the history of present illness.     All other systems reviewed and are negative.  EKGs/Labs/Other Studies Reviewed:    The following studies were reviewed today:  Carotid Dopplers 04/15/2017 Right Carotid: There is evidence in the right ICA of a 1-39% stenosis.     Non-hemodynamically significant plaque <50% noted in the CCA. Left Carotid: There is evidence in the left ICA of a 1-39% stenosis.       Non-hemodynamically significant plaque noted in the CCA. The ECA appears >50% stenosed.     Echocardiogram 02/26/2015 Study Conclusions  - Left ventricle: Abnormal septal motion. The cavity size was   normal. Wall thickness was normal. Systolic function was normal.   The estimated ejection fraction was in the range of 55% to 60%.   Wall motion was normal; there were no regional wall motion   abnormalities. Doppler parameters are consistent with elevated   ventricular end-diastolic filling pressure. - Aortic valve: Normal appearing tissue AVR. Valve area (VTI): 2.25   cm^2. Valve area (Vmean): 2.26 cm^2. - Atrial septum: No defect or patent foramen ovale was identified.   EKG:  EKG is not ordered today.  Recent Labs: 07/07/2017: ALT 26; Magnesium 2.1; TSH 0.825 07/12/2017: BUN 19; Creatinine, Ser 1.01; Hemoglobin 14.0; Platelets 153; Potassium 4.4; Sodium 138   Recent Lipid Panel    Component Value Date/Time   CHOL 157 01/13/2017 1001   TRIG 92 01/13/2017 1001   HDL 78 01/13/2017 1001   CHOLHDL 2.0 01/13/2017 1001   CHOLHDL 2.1 11/08/2015 1011   VLDL 17 11/08/2015 1011   LDLCALC 61 01/13/2017 1001    Physical Exam:    VS:  BP 138/64 (BP Location: Right Arm, Patient Position: Sitting, Cuff Size: Normal)   Pulse 68   Ht 5' 4.02" (1.626 m)   Wt 124 lb (56.2 kg)   SpO2 94%   BMI 21.27 kg/m     Wt Readings from Last 3 Encounters:  07/19/17 124 lb (56.2 kg)  07/07/17 125 lb (56.7 kg)  04/16/17 124 lb (56.2 kg)     Physical Exam  Constitutional: She is oriented to person, place, and time. She appears well-developed and well-nourished. No distress.  HENT:  Head: Normocephalic and atraumatic.  Neck: Normal range of motion. Neck supple. No JVD present.  Cardiovascular: Normal rate, regular rhythm and intact distal pulses. Exam reveals no gallop and no friction rub.  No murmur heard. Pulmonary/Chest: Effort normal and breath sounds normal. No respiratory distress.  On oxygen, pulse dose, via [place].  Abdominal: Soft. Bowel sounds are normal.  Musculoskeletal:  Normal range of motion. She exhibits no edema.  Neurological: She is alert and oriented to person, place, and time.  Skin: Skin is warm and dry.  Psychiatric: She has a normal mood and affect. Her behavior is normal. Thought content normal.     ASSESSMENT:    1. S/P aortic valve replacement with stentless valve   2. Essential hypertension   3. Other emphysema ([place])   4. Bilateral carotid artery stenosis   5. Chest pain in adult- non -specific    PLAN:    In order of problems listed above:  Chest pain: Pt found to be positive for H. Pylori. Chest pain fully resolved with treatment of H. Pylori.   S/P stentless bioprosthetic aortic valve replacement: Stable, No murmur.   Hypertension: BP well controlled.   COPD on oxygen: at her baseline and is able to do what she wants at a slower pace.   Tobacco use: Quit cigarettes in 2012, used e cig for a while, now full cessation.   GERD: Recently tested positive for H. pylori.  Started on Prilosec and bismuth subsalicylate. Pt is feeling much better.   Carotid artery stenosis stable by ultrasound in 03/2017: On aspirin and statin. LDL 61 on simvastatin. Continue current therapy.   Disposition: Pt to follow up with Dr. Tamala Julian for her usual annual in October unless new symptoms she is told to come earlier.   Medication Adjustments/Labs and Tests Ordered: Current medicines are reviewed at length with the patient today.  Concerns regarding medicines are outlined above. Labs and tests ordered and medication changes are outlined in the patient instructions below:  Patient Instructions  Medication Instructions:  Your physician recommends that you continue on your current medications as directed. Please refer to the Current Medication list given to you today.   Labwork: None ordered  Testing/Procedures: None ordered  Follow-Up: Your physician wants you to follow-up in October with Dr. Tamala Julian. You will receive a reminder letter in the mail  two months in advance. If you don't receive a letter, please call our office to schedule the  follow-up appointment.   Any Other Special Instructions Will Be Listed Below (If Applicable).     If you need a refill on your cardiac medications before your next appointment, please call your pharmacy.      Signed, Daune Perch, NP  07/19/2017 10:28 AM    [facility]

## 2017-07-19 ENCOUNTER — Ambulatory Visit: Payer: Medicare Other | Admitting: Cardiology

## 2017-07-19 ENCOUNTER — Encounter: Payer: Self-pay | Admitting: Cardiology

## 2017-07-19 VITALS — BP 138/64 | HR 68 | Ht 64.02 in | Wt 124.0 lb

## 2017-07-19 DIAGNOSIS — R079 Chest pain, unspecified: Secondary | ICD-10-CM | POA: Diagnosis not present

## 2017-07-19 DIAGNOSIS — J438 Other emphysema: Secondary | ICD-10-CM

## 2017-07-19 DIAGNOSIS — Z954 Presence of other heart-valve replacement: Secondary | ICD-10-CM

## 2017-07-19 DIAGNOSIS — I10 Essential (primary) hypertension: Secondary | ICD-10-CM | POA: Diagnosis not present

## 2017-07-19 DIAGNOSIS — I6523 Occlusion and stenosis of bilateral carotid arteries: Secondary | ICD-10-CM

## 2017-07-19 NOTE — Patient Instructions (Addendum)
Medication Instructions:  Your physician recommends that you continue on your current medications as directed. Please refer to the Current Medication list given to you today.   Labwork: None ordered  Testing/Procedures: None ordered  Follow-Up: Your physician wants you to follow-up in October with Dr. Tamala Julian. You will receive a reminder letter in the mail two months in advance. If you don't receive a letter, please call our office to schedule the follow-up appointment.   Any Other Special Instructions Will Be Listed Below (If Applicable).     If you need a refill on your cardiac medications before your next appointment, please call your pharmacy.

## 2017-07-28 ENCOUNTER — Ambulatory Visit: Payer: Medicare Other | Admitting: Cardiology

## 2017-07-29 DIAGNOSIS — H11153 Pinguecula, bilateral: Secondary | ICD-10-CM | POA: Diagnosis not present

## 2017-07-29 DIAGNOSIS — H11423 Conjunctival edema, bilateral: Secondary | ICD-10-CM | POA: Diagnosis not present

## 2017-07-29 DIAGNOSIS — H40012 Open angle with borderline findings, low risk, left eye: Secondary | ICD-10-CM | POA: Diagnosis not present

## 2017-07-29 DIAGNOSIS — H353131 Nonexudative age-related macular degeneration, bilateral, early dry stage: Secondary | ICD-10-CM | POA: Diagnosis not present

## 2017-07-29 DIAGNOSIS — T8522XA Displacement of intraocular lens, initial encounter: Secondary | ICD-10-CM | POA: Diagnosis not present

## 2017-08-11 DIAGNOSIS — H26493 Other secondary cataract, bilateral: Secondary | ICD-10-CM | POA: Diagnosis not present

## 2017-08-11 DIAGNOSIS — H43813 Vitreous degeneration, bilateral: Secondary | ICD-10-CM | POA: Diagnosis not present

## 2017-08-11 DIAGNOSIS — H26491 Other secondary cataract, right eye: Secondary | ICD-10-CM | POA: Diagnosis not present

## 2017-08-16 DIAGNOSIS — J449 Chronic obstructive pulmonary disease, unspecified: Secondary | ICD-10-CM | POA: Diagnosis not present

## 2017-08-30 DIAGNOSIS — H26492 Other secondary cataract, left eye: Secondary | ICD-10-CM | POA: Diagnosis not present

## 2017-09-15 DIAGNOSIS — J449 Chronic obstructive pulmonary disease, unspecified: Secondary | ICD-10-CM | POA: Diagnosis not present

## 2017-09-20 ENCOUNTER — Other Ambulatory Visit: Payer: Self-pay | Admitting: Family Medicine

## 2017-10-08 ENCOUNTER — Telehealth: Payer: Self-pay | Admitting: Emergency Medicine

## 2017-10-08 NOTE — Telephone Encounter (Signed)
Called and spoke to patient. Let her know it's time for her to scheduled her ROV and that at the appointment she can talk to the MD about her request. Patient stated that she has been having some headaches in the mornings and thinks that having an oxygen mask would help. Let patient know that she could tell more to the MD but that it could be a number of issues causing headaches. Scheduled ROV for 11/11/17 at 2:30pm. Nothing further needed at this time.

## 2017-10-08 NOTE — Telephone Encounter (Signed)
DME is [facility].

## 2017-10-13 DIAGNOSIS — Z9849 Cataract extraction status, unspecified eye: Secondary | ICD-10-CM | POA: Diagnosis not present

## 2017-10-13 DIAGNOSIS — H0100A Unspecified blepharitis right eye, upper and lower eyelids: Secondary | ICD-10-CM | POA: Diagnosis not present

## 2017-10-13 DIAGNOSIS — H04123 Dry eye syndrome of bilateral lacrimal glands: Secondary | ICD-10-CM | POA: Diagnosis not present

## 2017-10-13 DIAGNOSIS — H0100B Unspecified blepharitis left eye, upper and lower eyelids: Secondary | ICD-10-CM | POA: Diagnosis not present

## 2017-10-13 DIAGNOSIS — H40012 Open angle with borderline findings, low risk, left eye: Secondary | ICD-10-CM | POA: Diagnosis not present

## 2017-10-16 DIAGNOSIS — J449 Chronic obstructive pulmonary disease, unspecified: Secondary | ICD-10-CM | POA: Diagnosis not present

## 2017-10-19 ENCOUNTER — Ambulatory Visit: Payer: Medicare Other | Admitting: Family Medicine

## 2017-10-19 ENCOUNTER — Encounter: Payer: Self-pay | Admitting: Family Medicine

## 2017-10-19 VITALS — BP 150/62 | HR 72 | Ht 64.0 in | Wt 125.0 lb

## 2017-10-19 DIAGNOSIS — F341 Dysthymic disorder: Secondary | ICD-10-CM

## 2017-10-19 DIAGNOSIS — J438 Other emphysema: Secondary | ICD-10-CM | POA: Diagnosis not present

## 2017-10-19 DIAGNOSIS — R079 Chest pain, unspecified: Secondary | ICD-10-CM | POA: Diagnosis not present

## 2017-10-19 DIAGNOSIS — A048 Other specified bacterial intestinal infections: Secondary | ICD-10-CM | POA: Diagnosis not present

## 2017-10-19 DIAGNOSIS — G4700 Insomnia, unspecified: Secondary | ICD-10-CM | POA: Diagnosis not present

## 2017-10-19 DIAGNOSIS — I10 Essential (primary) hypertension: Secondary | ICD-10-CM | POA: Insufficient documentation

## 2017-10-19 DIAGNOSIS — Z87891 Personal history of nicotine dependence: Secondary | ICD-10-CM

## 2017-10-19 MED ORDER — ZOLPIDEM TARTRATE 5 MG PO TABS: ORAL_TABLET | ORAL | 1 refills | Status: DC

## 2017-10-19 MED ORDER — RAMELTEON 8 MG PO TABS: 4.0000 mg | ORAL_TABLET | Freq: Every day | ORAL | 1 refills | Status: DC

## 2017-10-19 NOTE — Patient Instructions (Signed)
Please return in 4 months with fasting blood work prior.

## 2017-10-19 NOTE — Progress Notes (Signed)
Impression and Recommendations:    1. H. pylori infection w/ GERD sx   2. Essential hypertension   3. Dysthymia   4. Other emphysema ([place])   5. History of smoking   ( 43 yrs at 0.5ppd) ;1991- until 2012 ( 8 cig/day )     6. Chest pain in adult- non -specific   7. Insomnia, unspecified type    Reviewed medications with the patient today in detail with patient: - Continue Calcium and Vitamin D supplement, along with multivitamin.  - Continue baby aspirin as prescribed.  - Continue Zocor as prescribed.  - Continue prednisone as prescribed.  - Continue synthroid as prescribed.  - Continue other medications as prescribed and managed by other specialists.  1. Hypertension - Patient tolerates current medications well without complication. - No changes made today.  Continue medications as prescribed.  - Patient knows that, due to her COPD, her blood pressure will vary widely with any amount of exertion.  She will continue to monitor her blood pressure for extreme lows or extreme highs.  - She will resume checking her blood pressure regularly and keeping a log, making sure to sit quietly for 15-20 minutes before measuring her blood pressure, for a more accurate reading with her condition.  2. GERD - Symptoms Resolved - History of H. pylori infection, resolved now. - Discontinue omeprazole. - Patient no longer taking Flagyl.  3. Emphysema/COPD - Patient managed on oxygen at home along with portal oxygen machine.  Stable at this time.  4. History of Smoking - Managed with Wellbutrin - Patient tolerated Wellbutrin well at 1/2 tablet every other day. - Patient wishes to discontinue this prescription once she finishes her current refill.  - Advised patient to continue avoiding cigarettes.  - Patient will let us know if she begins to feel depressed or otherwise symptomatic after discontinuing Wellbutrin.  5. Sleep - Patient was prescribed trazodone last appointment  (07/07/2017) and unable to tolerate. - Recommended alternate sleep aid to patient today. See med orders as trazadone she is unable to tolerate  - Patient will call to update [place] on progress with sleep aid.  - Advised patient against taking antihistamines for sleep aid.  6. Lifestyle, Exercise, & Dietary Habits - Healthy dietary habits encouraged, including low-carb, and high amounts of lean protein in diet.   - Patient should also consume adequate amounts of water - half of body weight in oz of water per day.   Education and routine counseling performed. Handouts provided.  7. Follow-Up - In the near future, patient will come in for fasting blood work and follow-up OV.  - Obtain bone density DEXA exam in near future (around November 2019) to remain up to date, especially while on chronic treatment with prednisone.   No orders of the defined types were placed in this encounter.   Meds ordered this encounter  Medications  . DISCONTD: zolpidem (AMBIEN) 5 MG tablet    Sig: 0.5 to 1 tablet prior to bedtime as needed sleep    Dispense:  30 tablet    Refill:  1  . ramelteon (ROZEREM) 8 MG tablet    Sig: Take 0.5 tablets (4 mg total) by mouth at bedtime.    Dispense:  30 tablet    Refill:  1     The patient was counseled, risk factors were discussed, anticipatory guidance given.  Gross side effects, risk and benefits, and alternatives of medications discussed with patient.  Patient is aware that  all medications have potential side effects and we are unable to predict every side effect or drug-drug interaction that may occur.  Expresses verbal understanding and consents to current therapy plan and treatment regimen.  Return in about 4 months (around 02/18/2018) for FBW 3 days prior to chronic f/up OV.  Please see AVS handed out to patient at the end of our visit for further patient instructions/ counseling done pertaining to today's office visit.    Note: This document was prepared  using Dragon voice recognition software and may include unintentional dictation errors.    This document serves as a record of services personally performed by Mellody Dance, DO. It was created on her behalf by Toni Amend, a trained medical scribe. The creation of this record is based on the scribe's personal observations and the provider's statements to them.   I have reviewed the above medical documentation for accuracy and completeness and I concur.  Mellody Dance 10/24/17 9:43 PM    Subjective:    HPI: [name] is a 84 y.o. female who presents to [place] [facility] today for follow up for HTN.    Notes she's moving around alright, feeling fine, but "would like to get rid of some pills that are unnecessary."  States "I feel like I'm a pharmacy."  She continues taking a multivitamin, "the one for the old ladies."  GERD - Symptoms Resolved History of H. pylori infection.  Patient recovered well. Patient is not sure if she's currently taking omeprazole for heartburn, but believes she is not.  Remarks "I can sleep flat on my back now."  Mood Doing well on the Wellbutrin - takes 1/2 tablet every other day. Believes that this is helping her quality of life.  Does not feel she will need to continue taking this after her current bottle runs out.  Sleep Patient has trouble staying asleep; notes she falls asleep for 2-3 hours, but then wakes for 4-5 hours per night.  She is not napping or sleeping during the day.  Patient was prescribed trazodone for sleep on 07/07/2017.  Patient was unable to tolerate this medication and is no longer taking it.  Patient is still taking 10 mg of melatonin.  This helps her sleep sometimes, but "most of the time I sleep an hour or two and then wake up until 5 or 6 o'clock in the morning.  Restless Legs Patient notes she has restless legs, and was taking iron for this for a while. Patient notes that her iron  supplementation was discontinued because her iron improved.  Emphysema/COPD Nothing acutely worse.  Notes "SOB depends on what I'm doing," referring to her supplemental oxygen.  Notes her oxygen can run 97 if she's sitting at the table doing nothing, but this number decreases when she starts doing activities.  Notes no recurrent occasional chest pain.  HTN:  -  Patient reports that her blood pressure has been controlled at home.  However, notes that it was 99/53 last night.  Sometime prior to this or after this, it was "maybe two points different."    States that her blood pressure typically falls around 125/60, reporting an average BP in the 120's/130's over 60's/70's.  Patient notes that she has not been taking her blood pressure readings much recently, but recently started again.  - Patient reports good compliance with blood pressure medications.  - Denies medication S-E   - Smoking Status noted   - She denies new onset  of: chest pain, exercise intolerance, shortness of breath, dizziness, visual changes, headache, lower extremity swelling or claudication.  Last 3 blood pressure readings in our office are as follows: BP Readings from Last 3 Encounters:  10/19/17 (!) 150/62  07/19/17 138/64  07/07/17 129/61    Pulse Readings from Last 3 Encounters:  10/19/17 72  07/19/17 68  07/07/17 73    Filed Weights   10/19/17 1542  Weight: 125 lb (56.7 kg)      Patient Care Team    Relationship Specialty Notifications Start End  Mellody Dance, DO PCP - General Family Medicine  10/08/15   Belva Crome, MD PCP - Cardiology Cardiology Admissions 07/18/17   Rexene Alberts, MD  Cardiothoracic Surgery  03/05/11   Belva Crome, MD  Cardiology  03/05/11   Collene Gobble, MD Consulting Physician Pulmonary Disease  11/05/15   Magdalen Spatz, NP Nurse Practitioner Pulmonary Disease  11/05/15      Lab Results  Component Value Date   CREATININE 1.01 (H) 07/12/2017   BUN 19  07/12/2017   NA 138 07/12/2017   K 4.4 07/12/2017   CL 99 (L) 07/12/2017   CO2 31 07/12/2017    Lab Results  Component Value Date   CHOL 157 01/13/2017   CHOL 133 11/08/2015    Lab Results  Component Value Date   HDL 78 01/13/2017   HDL 63 11/08/2015    Lab Results  Component Value Date   LDLCALC 61 01/13/2017   [place] 53 11/08/2015    Lab Results  Component Value Date   TRIG 92 01/13/2017   TRIG 84 11/08/2015    Lab Results  Component Value Date   CHOLHDL 2.0 01/13/2017   CHOLHDL 2.1 11/08/2015    No results found for: LDLDIRECT ===================================================================   Patient Active Problem List   Diagnosis Date Noted  . History of smoking   ( 43 yrs at 0.5ppd) ;1991- until 2012 ( 8 cig/day )   02/09/2017    Priority: High  . S/P aortic valve replacement with stentless valve 03/18/2011    Priority: High  . Carotid artery disease ([place])     Priority: High  . Hypertension     Priority: High  . COPD (chronic obstructive pulmonary disease) with emphysema ([place]) 03/02/2011    Priority: High  . Osteopenia 11/05/2015    Priority: Medium  . h/o Intention tremor  11/05/2015    Priority: Medium  . H. pylori infection w/ GERD sx 10/19/2017  . Essential hypertension 10/19/2017  . Medication monitoring encounter 07/07/2017  . Nicotine dependence with current use 07/07/2017  . Family history of breast cancer in sister 07/07/2017  . Family history of uterine cancer-sister 07/07/2017  . Family history of bladder cancer-in mother 07/07/2017  . Family history of lung cancer- father 07/07/2017  . Osteopenia after menopause 07/07/2017  . Chest pain in adult- non -specific 07/07/2017  . Current chronic use of systemic steroids 07/07/2017  . Heartburn 07/07/2017  . Dysthymia 02/09/2017  . Medication management 03/11/2016  . Dyspnea on exertion 03/11/2016  . Sleep disorder 12/11/2015  . Elevated hemoglobin A1c 11/13/2015  . Vitamin  deficiency 11/13/2015  . 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 Allergic rhinitis 11/05/2015  . Hematuria 11/05/2015  . Hypothyroidism; on synthroid 11/05/2015  . h/o Hypercalcemia 11/05/2015  . h/o RLS (restless legs syndrome) 11/05/2015  . History of nephrolithiasis 11/05/2015  . S/P aortic valve replacement and aortoplasty 04/13/2011  . h/o Atrial fibrillation ([place]) 03/22/2011  Past Medical History:  Diagnosis Date  . Allergic rhinitis   . Aortic stenosis   . Carotid artery disease ([place])   . Colon polyps   . h/o Atrial fibrillation ([place]) 03/22/2011   Brief post op   . Hematuria 11/05/2015  . Hypercalcemia   . Hypertension   . Hypothyroid   . Osteopenia   . 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 Pressure urticaria   . RLS (restless legs syndrome)   . S/P aortic valve replacement and aortoplasty 04/13/2011   33m Medtronic Freestyle porcine aortic root   . S/P aortic valve replacement with stentless valve 03/18/2011     Past Surgical History:  Procedure Laterality Date  . AORTIC ROOT REPLACEMENT  03/18/2011   267mMedtronic Freestyle Porcine aortic root with reimplantation of coronary arteries  . BREAST LUMPECTOMY     Left  . CATARACT EXTRACTION    . CHOLECYSTECTOMY    . FOOT SURGERY     Morton's neuroma  . KNEE ARTHROSCOPY     Left     Family History  Problem Relation Age of Onset  . Cancer Mother        bladder  . Lung cancer Father   . Heart disease Maternal Grandmother   . Emphysema Maternal Aunt   . Asthma Maternal Aunt   . Asthma Maternal Uncle   . Breast cancer Sister   . Uterine cancer Sister      Social History   Substance and Sexual Activity  Drug Use No  ,  Social History   Substance and Sexual Activity  Alcohol Use No  . Alcohol/week: 0.0 oz  ,  Social History   Tobacco Use  Smoking Status Former Smoker  . Packs/day: 0.90  . Years: 62.00  . Pack years: 55.80  . Types: Cigarettes  . Last attempt to quit: 07/14/2010  . Years since  quitting: 7.2  Smokeless Tobacco Never Used  ,    Current Outpatient Medications on File Prior to Visit  Medication Sig Dispense Refill  . albuterol (PROAIR HFA) 108 (90 Base) MCG/ACT inhaler Inhale 2 puffs into the lungs every 6 (six) hours as needed for wheezing or shortness of breath. 1 Inhaler 3  . amLODipine (NORVASC) 2.5 MG tablet Take half (1/2) tablet (1.25 mg) by mouth daily.    . Marland Kitchenspirin 81 MG tablet Take 81 mg by mouth daily.    . Calcium Carbonate-Vitamin D3 600-400 MG-UNIT TABS Take by mouth 2 (two) times daily.    . Cholecalciferol (VITAMIN D3) 5000 units CAPS Take 1 capsule by mouth daily.    . Marland Kitchenevothyroxine (SYNTHROID, LEVOTHROID) 112 MCG tablet Take 1 tablet (112 mcg total) by mouth daily. 90 tablet 3  . Multiple Vitamins-Minerals (MULTIVITAMIN WITH MINERALS) tablet Take 1 tablet by mouth daily.    . predniSONE (DELTASONE) 5 MG tablet TAKE 1 TABLET BY MOUTH ONCE DAILY WITH BREAKFAST 90 tablet 1  . simvastatin (ZOCOR) 20 MG tablet TAKE 1 TABLET BY MOUTH AT BEDTIME 90 tablet 0  . STIOLTO RESPIMAT 2.5-2.5 MCG/ACT AERS INHALE 2 PUFFS INTO THE LUNGS DAILY 1 Inhaler 5   No current facility-administered medications on file prior to visit.      Allergies  Allergen Reactions  . Clarithromycin     Mouth/throat sore  . Tamsulosin Itching  . Trazodone And Nefazodone     Dizziness   . Penicillins Itching and Rash     Review of Systems:   General:  Denies fever, chills Optho/Auditory:   Denies  visual changes, blurred vision Respiratory:   Denies SOB, cough, wheeze, DIB  Cardiovascular:   Denies chest pain, palpitations, painful respirations Gastrointestinal:   Denies nausea, vomiting, diarrhea.  Endocrine:     Denies new hot or cold intolerance Musculoskeletal:  Denies joint swelling, gait issues, or new unexplained myalgias/ arthralgias Skin:  Denies rash, suspicious lesions  Neurological:    Denies dizziness, unexplained weakness, numbness  Psychiatric/Behavioral:    Denies mood changes  Objective:    Blood pressure (!) 150/62, pulse 72, height 5' 4" (1.626 m), weight 125 lb (56.7 kg), SpO2 97 %.  Body mass index is 21.46 kg/m.  General: Well Developed, well nourished, and in no acute distress.  HEENT: Normocephalic, atraumatic, pupils equal round reactive to light, neck supple, No carotid bruits, no JVD Skin: Warm and dry, cap RF less 2 sec Cardiac: Patient with AVR and systolic ejection murmur 2/6, best heard left sternal border.  Regular rate and rhythm, S1, S2 WNL's, no rubs or gallops Respiratory: Nasal cannula in place for O2 supplementation.  Decreased aeration globally with increased exhalation phase.  ECTA B/L, Not using accessory muscles, speaking in full sentences. NeuroM-Sk: Ambulates w/o assistance, moves ext * 4 w/o difficulty, sensation grossly intact.  Ext: scant edema b/l lower ext Psych: No HI/SI, judgement and insight good, Euthymic mood. Full Affect.

## 2017-11-11 ENCOUNTER — Ambulatory Visit: Payer: Medicare Other | Admitting: Emergency Medicine

## 2017-11-15 DIAGNOSIS — J449 Chronic obstructive pulmonary disease, unspecified: Secondary | ICD-10-CM | POA: Diagnosis not present

## 2017-11-20 ENCOUNTER — Other Ambulatory Visit: Payer: Self-pay | Admitting: Emergency Medicine

## 2017-12-16 DIAGNOSIS — J449 Chronic obstructive pulmonary disease, unspecified: Secondary | ICD-10-CM | POA: Diagnosis not present

## 2017-12-27 ENCOUNTER — Other Ambulatory Visit: Payer: Self-pay | Admitting: Emergency Medicine

## 2017-12-27 ENCOUNTER — Other Ambulatory Visit: Payer: Self-pay | Admitting: Family Medicine

## 2017-12-30 ENCOUNTER — Ambulatory Visit: Payer: Medicare Other | Admitting: Emergency Medicine

## 2017-12-30 ENCOUNTER — Encounter: Payer: Self-pay | Admitting: Emergency Medicine

## 2017-12-30 DIAGNOSIS — Z9981 Dependence on supplemental oxygen: Secondary | ICD-10-CM | POA: Diagnosis not present

## 2017-12-30 DIAGNOSIS — J438 Other emphysema: Secondary | ICD-10-CM | POA: Diagnosis not present

## 2017-12-30 NOTE — Assessment & Plan Note (Signed)
[name] presents for management of her COPD. Her symptoms well controlled on stiolto, 48m prednisone, and home oxygen. Albuterol use at 2-3 times a month. Denies any significant worsening of dyspnea or cough.  - C/w Stioloto daily - C/w Albuterol 2 puffs PRN for dyspnea - C/w home oxygen - Recommend flu shot when available - F/u in 1 year

## 2017-12-30 NOTE — Patient Instructions (Signed)
[name]  Thank you for coming in. Here are the recommendations based on what we discussed:  Continue with your home oxygen at 2L Continue taking your Stiolto as prescribed Please remember to get your high dose flu shot later this year Follow up with [place] in 1 year

## 2017-12-30 NOTE — Progress Notes (Signed)
[name]    [id number]    18-Dec-1929  Primary Care Physician:Opalski, Neoma Laming, DO  Referring Physician: Mellody Dance, [address] [place], [place] [id number]  Chief complaint:  COPD, Respiratory Failure  HPI: [name] is a 84 yo F w/ PMH of hypothyroidism, HTN, CAD, Aortic valve stenosis s/p replacement presenting to the clinic for management of her COPD and Heart failure. She states she has been doing well on Stiolto and home oxygen concentrator. Able to perform all of her ADLs and IADLs without dyspnea as long as she take it slow. Albuterol uses couple times a month. No night time symptoms or cough. No acute exacerbations requiring hospitalization in the last 6 months.  She has a new complaint of mild headaches. States it started 6 weeks ago but she only gets it once a week. Treated effectively with Tylenol. Denies any blurry vision, light-headedness, seizures, focal weakness, numbness or tingling. No F/N/V/D/C  Allergies as of 12/30/2017 - Review Complete 12/30/2017  Allergen Reaction Noted  . Clarithromycin  03/02/2011  . Tamsulosin Itching 12/11/2015  . Trazodone and nefazodone  10/19/2017  . Penicillins Itching and Rash 03/02/2011    Past Medical History:  Diagnosis Date  . Allergic rhinitis   . Aortic stenosis   . Carotid artery disease ([place])   . Colon polyps   . h/o Atrial fibrillation ([place]) 03/22/2011   Brief post op   . Hematuria 11/05/2015  . Hypercalcemia   . Hypertension   . Hypothyroid   . Osteopenia   . 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 Pressure urticaria   . RLS (restless legs syndrome)   . S/P aortic valve replacement and aortoplasty 04/13/2011   62m Medtronic Freestyle porcine aortic root   . S/P aortic valve replacement with stentless valve 03/18/2011    Past Surgical History:  Procedure Laterality Date  . AORTIC ROOT REPLACEMENT  03/18/2011   258mMedtronic Freestyle Porcine aortic root with reimplantation of coronary  arteries  . BREAST LUMPECTOMY     Left  . CATARACT EXTRACTION    . CHOLECYSTECTOMY    . FOOT SURGERY     Morton's neuroma  . KNEE ARTHROSCOPY     Left     Review of systems: Review of Systems  Constitutional: Negative for fever and chills.  HENT: Negative.   Eyes: Negative for blurred vision.  Respiratory: as per HPI  Cardiovascular: Negative for chest pain and palpitations.  Gastrointestinal: Negative for vomiting, diarrhea, blood per rectum. Neurological: Negative for dizziness, tremors, focal weakness, seizures and loss of consciousness.  Psychiatric/Behavioral: Negative for depression, suicidal ideas and hallucinations.  All other systems reviewed and are negative.  Physical Exam: Blood pressure 130/60, pulse 73, height _0  (1.626 m), weight 124 lb 6.4 oz (56.4 kg), SpO2 90 %. Gen:      Pleasant female in no acute distress HEENT:  EOMI, sclera anicteric Neck:     No masses; no thyromegaly Lungs:    Distant breath sounds bilaterally; normal respiratory effort; no wheeze; no rales CV:         Regular rate and rhythm; no murmurs Ext:    No edema; adequate peripheral perfusion Skin:      Warm and dry; no rash Neuro: alert and oriented x 3 Psych: normal mood and affect  A/P: COPD (chronic obstructive pulmonary disease) with emphysema ([place]adsMrs.Sroka presents for management of her COPD. Her symptoms well controlled on stiolto, 47m47mrednisone, and home oxygen.  Albuterol use at 2-3 times a month. Denies any significant worsening of dyspnea or cough.  - C/w Stioloto daily - C/w Albuterol 2 puffs PRN for dyspnea - C/w home oxygen - Recommend flu shot when available - F/u in 1 year  Oxygen dependent [name] presents with chronic hypoxemic respiratory failure. Walk test in office last visit did not show de-sats. Patient purchased home concentrator out of pocket. Doing well on 2Ls at home  - C/w home oxygen 2Ls    Gilberto Better, PGY1 [facility] 12/30/2017, 12:27 PM  CC: Mellody Dance, DO

## 2017-12-30 NOTE — Assessment & Plan Note (Signed)
Mrs.Clabaugh presents with chronic hypoxemic respiratory failure. Walk test in office last visit did not show de-sats. Patient purchased home concentrator out of pocket. Doing well on 2Ls at home  - C/w home oxygen 2Ls

## 2018-01-03 ENCOUNTER — Other Ambulatory Visit: Payer: Self-pay | Admitting: Family Medicine

## 2018-01-03 DIAGNOSIS — I10 Essential (primary) hypertension: Secondary | ICD-10-CM

## 2018-01-05 ENCOUNTER — Encounter: Payer: Self-pay | Admitting: Family Medicine

## 2018-01-05 ENCOUNTER — Ambulatory Visit: Payer: Medicare Other | Admitting: Family Medicine

## 2018-01-05 VITALS — BP 126/68 | HR 72 | Ht 64.0 in | Wt 125.0 lb

## 2018-01-05 DIAGNOSIS — H6591 Unspecified nonsuppurative otitis media, right ear: Secondary | ICD-10-CM | POA: Diagnosis not present

## 2018-01-05 MED ORDER — CLINDAMYCIN HCL 300 MG PO CAPS: 300.0000 mg | ORAL_CAPSULE | Freq: Three times a day (TID) | ORAL | 0 refills | Status: DC

## 2018-01-05 NOTE — Patient Instructions (Signed)

## 2018-01-05 NOTE — Progress Notes (Signed)
Acute Care Office visit   Assessment and plan:  1. Right otitis media with effusion      Right Otitis Media -Started clindamyacin; see med list below -Discussed the importance of completing entire antibiotic prescription even if symptoms improve -Recommended OTC medications for pain, increasing fluid intake, and rest   - Viral vs Allergic vs Bacterial causes for pt's symptoms reveiwed.    - Supportive care and various OTC medications discussed in addition to any prescribed. - Call or RTC if new symptoms, or if no improvement or worse over next several days.   - Will consider ABX if sx continue past 10 days and worsening if not already given.    Meds ordered this encounter  Medications  . clindamycin (CLEOCIN) 300 MG capsule    Sig: Take 1 capsule (300 mg total) by mouth 3 (three) times daily.    Dispense:  21 capsule    Refill:  0    Medications Discontinued During This Encounter  Medication Reason  . amLODipine (NORVASC) 2.5 MG tablet Duplicate     Gross side effects, risk and benefits, and alternatives of medications discussed with patient.  Patient is aware that all medications have potential side effects and we are unable to predict every sideeffect or drug-drug interaction that may occur.  Expresses verbal understanding and consents to current therapy plan and treatment regiment.   Education and routine counseling performed. Handouts provided.  Anticipatory guidance and routine counseling done re: condition, txmnt options and need for follow up. All questions of patient's were answered.  Return if symptoms worsen or fail to improve, for Follow-up for chronic care in the future as this.  Please see AVS handed out to patient at the end of our visit for additional patient instructions/ counseling done pertaining to today's office visit.  Note:  This document was partially repared using Dragon voice recognition software and may include unintentional dictation  errors.  I have reviewed the above medical documentation for accuracy and completeness and I concur.  Mellody Dance 01/05/18 8:33 PM   Subjective:    Chief Complaint  Patient presents with  . Ear Pain    HPI:  Pt presents with Sx for approximately 8 days   -Experiencing earache, pain, and states "it hurts like hell to open [her] mouth" -States she has been sleeping a lot for the past few days  Denies: facial pain, diminished hearing, fever -States runny nose is due to allergies    -States she has taken antibiotics in the past without issues -Pt allergic to amoxicillin   Patient Care Team    Relationship Specialty Notifications Start End  Mellody Dance, DO PCP - General Family Medicine  10/08/15   Belva Crome, MD PCP - Cardiology Cardiology Admissions 07/18/17   Rexene Alberts, MD  Cardiothoracic Surgery  03/05/11   Belva Crome, MD  Cardiology  03/05/11   Collene Gobble, MD Consulting Physician Pulmonary Disease  11/05/15   Magdalen Spatz, NP Nurse Practitioner Pulmonary Disease  11/05/15     Past medical history, Surgical history, Family history reviewed and noted below, Social history, Allergies, and Medications have been entered into the medical record, reviewed and changed as needed.   Allergies  Allergen Reactions  . Clarithromycin     Mouth/throat sore  . Tamsulosin Itching  . Trazodone And Nefazodone     Dizziness   . Penicillins Itching and Rash    Review of Systems: - see above HPI for  pertinent positives General:   No F/C, wt loss Pulm:   No DIB, pleuritic chest pain Card:  No CP, palpitations Abd:  No n/v/d or pain Ext:  No inc edema from baseline   Objective:   Blood pressure 126/68, pulse 72, height 5' 4" (1.626 m), weight 125 lb (56.7 kg), SpO2 97 %. Body mass index is 21.46 kg/m. General: Well Developed, well nourished, appropriate for stated age.  Neuro: Alert and oriented x3, extra-ocular muscles intact, sensation grossly intact.   HEENT: Normocephalic, atraumatic, pupils equal round reactive to light, neck supple, no masses, slight painful lymphadenopathy, TM's intact B/L, no acute findings. Nares- patent, clear d/c, OP- clear, mild erythema, No TTP sinuses Right TM no light reflex, yellowish exudate covering entire TM with bulge. No anatomy appreciated Skin: Warm and dry, no gross rash. Cardiac: RRR, S1 S2,  no murmurs rubs or gallops.  Respiratory: ECTA B/L and A/P, Not using accessory muscles, speaking in full sentences- unlabored. Vascular:  No gross lower ext edema, cap RF less 2 sec. Psych: No HI/SI, judgement and insight good, Euthymic mood. Full Affect.

## 2018-01-06 ENCOUNTER — Other Ambulatory Visit: Payer: Self-pay | Admitting: Family Medicine

## 2018-01-06 DIAGNOSIS — J438 Other emphysema: Secondary | ICD-10-CM

## 2018-01-06 DIAGNOSIS — E038 Other specified hypothyroidism: Secondary | ICD-10-CM

## 2018-01-16 DIAGNOSIS — J449 Chronic obstructive pulmonary disease, unspecified: Secondary | ICD-10-CM | POA: Diagnosis not present

## 2018-02-15 DIAGNOSIS — J449 Chronic obstructive pulmonary disease, unspecified: Secondary | ICD-10-CM | POA: Diagnosis not present

## 2018-03-18 DIAGNOSIS — J449 Chronic obstructive pulmonary disease, unspecified: Secondary | ICD-10-CM | POA: Diagnosis not present

## 2018-04-01 ENCOUNTER — Other Ambulatory Visit: Payer: Self-pay | Admitting: Family Medicine

## 2018-04-01 DIAGNOSIS — I10 Essential (primary) hypertension: Secondary | ICD-10-CM

## 2018-04-06 NOTE — Progress Notes (Signed)
Cardiology Office Note:    Date:  04/07/2018   ID:  [name], DOB Mar 19, 1930, MRN [id number]  PCP:  Mellody Dance, DO  Cardiologist:  Sinclair Grooms, MD   Referring MD: Mellody Dance, DO   Chief Complaint  Patient presents with  . Cardiac Valve Problem    History of Present Illness:    [name] is a 84 y.o. female with a hx of Hypertension, aortic valve bioprosthesis 2012, carotid disease, COPD, and hyperlipidemia.  Overall, she is on chronic continuous O2 therapy and is limited predominantly by decreased strength and dyspnea on exertion.  Dr. Lamonte Sakai is her pulmonologist.  She states his quantification of pulmonary disease is "severe".  She has not had significant lower extremity edema or orthopnea.  She denies palpitations and racing heart.  No episodes of syncope.  She has not had chest tightness or fullness.  Physical activity does not bring about arm or chest discomfort.  She is compliant with her current medical therapy for end-stage pulmonary disease and secondary risk prevention for vascular disease.  No side effects.  Dietary restriction of salt is not being adhered to.  Past Medical History:  Diagnosis Date  . Allergic rhinitis   . Aortic stenosis   . Carotid artery disease ([place])   . Colon polyps   . h/o Atrial fibrillation ([place]) 03/22/2011   Brief post op   . Hematuria 11/05/2015  . Hypercalcemia   . Hypertension   . Hypothyroid   . Osteopenia   . 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 Pressure urticaria   . RLS (restless legs syndrome)   . S/P aortic valve replacement and aortoplasty 04/13/2011   1m Medtronic Freestyle porcine aortic root   . S/P aortic valve replacement with stentless valve 03/18/2011    Past Surgical History:  Procedure Laterality Date  . AORTIC ROOT REPLACEMENT  03/18/2011   288mMedtronic Freestyle Porcine aortic root with reimplantation of coronary arteries  . BREAST LUMPECTOMY     Left  . CATARACT EXTRACTION    .  CHOLECYSTECTOMY    . FOOT SURGERY     Morton's neuroma  . KNEE ARTHROSCOPY     Left    Current Medications: Current Meds  Medication Sig  . albuterol (PROAIR HFA) 108 (90 Base) MCG/ACT inhaler Inhale 2 puffs into the lungs every 6 (six) hours as needed for wheezing or shortness of breath.  . Marland KitchenmLODipine (NORVASC) 2.5 MG tablet TAKE 1/2 (ONE-HALF) TABLET BY MOUTH ONCE DAILY  . aspirin 81 MG tablet Take 81 mg by mouth daily.  . Calcium Carbonate-Vitamin D3 600-400 MG-UNIT TABS Take by mouth 2 (two) times daily.  . Cholecalciferol (VITAMIN D3) 5000 units CAPS Take 1 capsule by mouth daily.  . clindamycin (CLEOCIN) 300 MG capsule Take 1 capsule (300 mg total) by mouth 3 (three) times daily.  . Marland Kitchenevothyroxine (SYNTHROID, LEVOTHROID) 112 MCG tablet TAKE 1 TABLET BY MOUTH ONCE DAILY  . Multiple Vitamins-Minerals (MULTIVITAMIN WITH MINERALS) tablet Take 1 tablet by mouth daily.  . predniSONE (DELTASONE) 5 MG tablet TAKE 1 TABLET BY MOUTH ONCE DAILY WITH BREAKFAST  . ramelteon (ROZEREM) 8 MG tablet Take 0.5 tablets (4 mg total) by mouth at bedtime.  . simvastatin (ZOCOR) 20 MG tablet TAKE 1 TABLET BY MOUTH AT BEDTIME  . STIOLTO RESPIMAT 2.5-2.5 MCG/ACT AERS INHALE 2 PUFFS INTO THE LUNGS DAILY     Allergies:   Clarithromycin; Tamsulosin; Trazodone and nefazodone; and Penicillins   Social History   Socioeconomic  History  . Marital status: Widowed    Spouse name: Not on file  . Number of children: N  . Years of education: Not on file  . Highest education level: Not on file  Occupational History  . Occupation: housewife  Social Needs  . Financial resource strain: Not on file  . Food insecurity:    Worry: Not on file    Inability: Not on file  . Transportation needs:    Medical: Not on file    Non-medical: Not on file  Tobacco Use  . Smoking status: Former Smoker    Packs/day: 0.90    Years: 62.00    Pack years: 55.80    Types: Cigarettes    Last attempt to quit: 07/14/2010    Years  since quitting: 7.7  . Smokeless tobacco: Never Used  Substance and Sexual Activity  . Alcohol use: No    Alcohol/week: 0.0 standard drinks  . Drug use: No  . Sexual activity: Never  Lifestyle  . Physical activity:    Days per week: Not on file    Minutes per session: Not on file  . Stress: Not on file  Relationships  . Social connections:    Talks on phone: Not on file    Gets together: Not on file    Attends religious service: Not on file    Active member of club or organization: Not on file    Attends meetings of clubs or organizations: Not on file    Relationship status: Not on file  Other Topics Concern  . Not on file  Social History Narrative  . Not on file     Family History: The patient's  family history includes Asthma in her maternal aunt and maternal uncle; Breast cancer in her sister; Cancer in her mother; Emphysema in her maternal aunt; Heart disease in her maternal grandmother; Lung cancer in her father; Uterine cancer in her sister.  ROS:   Please see the history of present illness.    Occasional irregular heartbeat.  Shortness of breath with activity, headaches when she awakens in the morning.  When a headache is present her blood pressures tend to be higher.  Noted a blood pressure above 397 mmHg systolic this morning upon awakening but before taking her medication.  All other systems reviewed and are negative.  EKGs/Labs/Other Studies Reviewed:    The following studies were reviewed today:  Carotid vascular ultrasound November 2018: Final Interpretation: Right Carotid: There is evidence in the right ICA of a 1-39% stenosis.        Non-hemodynamically significant plaque <50% noted in the CCA.  Left Carotid: There is evidence in the left ICA of a 1-39% stenosis.       Non-hemodynamically significant plaque noted in the CCA. The ECA       appears >50% stenosed.  Vertebrals: Both vertebral arteries were patent with antegrade  flow. Subclavians: Normal flow hemodynamics were seen in bilateral subclavian       arteries.  *See table(s) above for measurements and observations.  2D Doppler echocardiogram 02/26/2015: Study Conclusions  - Left ventricle: Abnormal septal motion. The cavity size was   normal. Wall thickness was normal. Systolic function was normal.   The estimated ejection fraction was in the range of 55% to 60%.   Wall motion was normal; there were no regional wall motion   abnormalities. Doppler parameters are consistent with elevated   ventricular end-diastolic filling pressure. - Aortic valve: Normal appearing tissue AVR. Valve  area (VTI): 2.25   cm^2. Valve area (Vmean): 2.26 cm^2. - Atrial septum: No defect or patent foramen ovale was identified.  EKG:  EKG is not ordered today.  The ekg performed in February 2019 demonstrates normal sinus rhythm with overall normal appearance.  Baseline artifact.  Recent Labs: 07/07/2017: ALT 26; Magnesium 2.1; TSH 0.825 07/12/2017: BUN 19; Creatinine, Ser 1.01; Hemoglobin 14.0; Platelets 153; Potassium 4.4; Sodium 138  Recent Lipid Panel    Component Value Date/Time   CHOL 157 01/13/2017 1001   TRIG 92 01/13/2017 1001   HDL 78 01/13/2017 1001   CHOLHDL 2.0 01/13/2017 1001   CHOLHDL 2.1 11/08/2015 1011   VLDL 17 11/08/2015 1011   LDLCALC 61 01/13/2017 1001    Physical Exam:    VS:  BP 124/70   Pulse 78   Ht _0  (1.626 m)   Wt 124 lb 12.8 oz (56.6 kg)   SpO2 92%   BMI 21.42 kg/m     Wt Readings from Last 3 Encounters:  04/07/18 124 lb 12.8 oz (56.6 kg)  01/05/18 125 lb (56.7 kg)  12/30/17 124 lb 6.4 oz (56.4 kg)     GEN:  Well nourished, well developed in no acute distress HEENT: Normal NECK: No JVD. LYMPHATICS: No lymphadenopathy CARDIAC: RRR, 1/60 2/6 right upper sternal border systolic murmur, no gallop, no edema. VASCULAR: 2+ bilateral radial and carotid pulses.  Faint bilateral carotid bruits. RESPIRATORY:  Clear to  auscultation without rales, wheezing or rhonchi  ABDOMEN: Soft, non-tender, non-distended, No pulsatile mass, MUSCULOSKELETAL: No deformity  SKIN: Warm and dry NEUROLOGIC:  Alert and oriented x 3 PSYCHIATRIC:  Normal affect   ASSESSMENT:    1. S/P aortic valve replacement and aortoplasty   2. Paroxysmal atrial fibrillation ([facility])   3. Bilateral carotid artery stenosis   4. Centrilobular emphysema ([place])   5. Essential hypertension   6. Oxygen dependent    PLAN:    In order of problems listed above:  1. Last echocardiogram was in 2016.  There are no auscultatory findings to suggest dysfunction.  At age 63 with O2 dependent COPD, I feel no clinical concern to reevaluate the valve.  Plan of clinical follow-up in 1 year. 2. Occasional palpitations.  Clinically not in atrial fibrillation today.  We will keep her antennas up for any prolonged episodes of palpitation. 3. Stable as noted on vascular ultrasound 1 year ago.  May consider repeat in 2020. 4. Severe COPD/emphysema, which is her most significant comorbidity.  She has type follow-up with Dr. Lamonte Sakai. 5. Low-salt diet is rediscussed. 6. Continuous oxygen therapy should be continued.  Secondary risk prevention was discussed briefly.  She is not able to exercise.  Lipids and blood pressure are excellent.  No new recommendations are given.   Medication Adjustments/Labs and Tests Ordered: Current medicines are reviewed at length with the patient today.  Concerns regarding medicines are outlined above.  No orders of the defined types were placed in this encounter.  No orders of the defined types were placed in this encounter.   Patient Instructions  Medication Instructions:  Your physician recommends that you continue on your current medications as directed. Please refer to the Current Medication list given to you today.  If you need a refill on your cardiac medications before your next appointment, please call your pharmacy.   Lab  work: None If you have labs (blood work) drawn today and your tests are completely normal, you will receive your results only by: Marland Kitchen  MyChart Message (if you have MyChart) OR . A paper copy in the mail If you have any lab test that is abnormal or we need to change your treatment, we will call you to review the results.  Testing/Procedures: None  Follow-Up: At [facility], you and your health needs are our priority.  As part of our continuing mission to provide you with exceptional heart care, we have created designated Provider Care Teams.  These Care Teams include your primary Cardiologist (physician) and Advanced Practice Providers (APPs -  Physician Assistants and Nurse Practitioners) who all work together to provide you with the care you need, when you need it. You will need a follow up appointment in 12 months.  Please call our office 2 months in advance to schedule this appointment.  You may see Sinclair Grooms, MD or one of the following Advanced Practice Providers on your designated Care Team:   Truitt Merle, NP Cecilie Kicks, NP . Kathyrn Drown, NP  Any Other Special Instructions Will Be Listed Below (If Applicable).       Signed, Sinclair Grooms, MD  04/07/2018 12:47 PM    [place]

## 2018-04-07 ENCOUNTER — Encounter: Payer: Self-pay | Admitting: Interventional Cardiology

## 2018-04-07 ENCOUNTER — Ambulatory Visit: Payer: Medicare Other | Admitting: Interventional Cardiology

## 2018-04-07 VITALS — BP 124/70 | HR 78 | Ht 64.0 in | Wt 124.8 lb

## 2018-04-07 DIAGNOSIS — Z9981 Dependence on supplemental oxygen: Secondary | ICD-10-CM

## 2018-04-07 DIAGNOSIS — Z952 Presence of prosthetic heart valve: Secondary | ICD-10-CM

## 2018-04-07 DIAGNOSIS — J432 Centrilobular emphysema: Secondary | ICD-10-CM | POA: Diagnosis not present

## 2018-04-07 DIAGNOSIS — I480 Paroxysmal atrial fibrillation: Secondary | ICD-10-CM

## 2018-04-07 DIAGNOSIS — I6523 Occlusion and stenosis of bilateral carotid arteries: Secondary | ICD-10-CM | POA: Diagnosis not present

## 2018-04-07 DIAGNOSIS — I10 Essential (primary) hypertension: Secondary | ICD-10-CM | POA: Diagnosis not present

## 2018-04-07 NOTE — Patient Instructions (Signed)
Medication Instructions:  Your physician recommends that you continue on your current medications as directed. Please refer to the Current Medication list given to you today.  If you need a refill on your cardiac medications before your next appointment, please call your pharmacy.   Lab work: None If you have labs (blood work) drawn today and your tests are completely normal, you will receive your results only by: Marland Kitchen MyChart Message (if you have MyChart) OR . A paper copy in the mail If you have any lab test that is abnormal or we need to change your treatment, we will call you to review the results.  Testing/Procedures: None  Follow-Up: At [facility], you and your health needs are our priority.  As part of our continuing mission to provide you with exceptional heart care, we have created designated Provider Care Teams.  These Care Teams include your primary Cardiologist (physician) and Advanced Practice Providers (APPs -  Physician Assistants and Nurse Practitioners) who all work together to provide you with the care you need, when you need it. You will need a follow up appointment in 12 months.  Please call our office 2 months in advance to schedule this appointment.  You may see Sinclair Grooms, MD or one of the following Advanced Practice Providers on your designated Care Team:   Truitt Merle, NP Cecilie Kicks, NP . Kathyrn Drown, NP  Any Other Special Instructions Will Be Listed Below (If Applicable).

## 2018-04-11 ENCOUNTER — Other Ambulatory Visit: Payer: Self-pay | Admitting: Family Medicine

## 2018-04-17 DIAGNOSIS — J449 Chronic obstructive pulmonary disease, unspecified: Secondary | ICD-10-CM | POA: Diagnosis not present

## 2018-04-28 ENCOUNTER — Ambulatory Visit (INDEPENDENT_AMBULATORY_CARE_PROVIDER_SITE_OTHER): Payer: Medicare Other | Admitting: Family Medicine

## 2018-04-28 ENCOUNTER — Encounter: Payer: Self-pay | Admitting: Family Medicine

## 2018-04-28 ENCOUNTER — Other Ambulatory Visit: Payer: Self-pay | Admitting: Family Medicine

## 2018-04-28 VITALS — BP 118/69 | HR 67 | Temp 98.5°F | Ht 64.0 in | Wt 127.0 lb

## 2018-04-28 DIAGNOSIS — I6523 Occlusion and stenosis of bilateral carotid arteries: Secondary | ICD-10-CM | POA: Diagnosis not present

## 2018-04-28 DIAGNOSIS — D509 Iron deficiency anemia, unspecified: Secondary | ICD-10-CM | POA: Diagnosis not present

## 2018-04-28 DIAGNOSIS — E559 Vitamin D deficiency, unspecified: Secondary | ICD-10-CM | POA: Diagnosis not present

## 2018-04-28 DIAGNOSIS — G4700 Insomnia, unspecified: Secondary | ICD-10-CM | POA: Diagnosis not present

## 2018-04-28 DIAGNOSIS — Z0000 Encounter for general adult medical examination without abnormal findings: Secondary | ICD-10-CM

## 2018-04-28 DIAGNOSIS — E038 Other specified hypothyroidism: Secondary | ICD-10-CM | POA: Diagnosis not present

## 2018-04-28 DIAGNOSIS — I480 Paroxysmal atrial fibrillation: Secondary | ICD-10-CM | POA: Diagnosis not present

## 2018-04-28 DIAGNOSIS — I10 Essential (primary) hypertension: Secondary | ICD-10-CM | POA: Diagnosis not present

## 2018-04-28 DIAGNOSIS — Z23 Encounter for immunization: Secondary | ICD-10-CM | POA: Diagnosis not present

## 2018-04-28 DIAGNOSIS — E2839 Other primary ovarian failure: Secondary | ICD-10-CM | POA: Diagnosis not present

## 2018-04-28 MED ORDER — RAMELTEON 8 MG PO TABS: 4.0000 mg | ORAL_TABLET | Freq: Every day | ORAL | 0 refills | Status: DC

## 2018-04-28 MED ORDER — TETANUS-DIPHTH-ACELL PERTUSSIS 5-2.5-18.5 LF-MCG/0.5 IM SUSP: 0.5000 mL | Freq: Once | INTRAMUSCULAR | 0 refills | Status: AC

## 2018-04-28 MED ORDER — ZOSTER VAC RECOMB ADJUVANTED 50 MCG/0.5ML IM SUSR: 0.5000 mL | Freq: Once | INTRAMUSCULAR | 0 refills | Status: AC

## 2018-04-28 NOTE — Patient Instructions (Addendum)
Ms. Portillo , Thank you for taking time to come for your Medicare Wellness Visit. I appreciate your ongoing commitment to your health goals. Please review the following plan we discussed and let me know if I can assist you in the future.   This is a list of the screening recommended for you and due dates:  Health Maintenance  Topic Date Due  . Tetanus Vaccine  01/06/2019*  . Flu Shot  Completed  . DEXA scan (bone density measurement)  Completed  . Pneumonia vaccines  Completed  *Topic was postponed. The date shown is not the original due date.   -As promised I just wanted let you know that I reached out to your pulmonologist to make sure he did not feel a lung cancer screening test was necessary.  If he feels differently, we certainly will reach out and let you know.  I hope you have a wonderful holiday season!!    Preventive Care for Adults, Female  A healthy lifestyle and preventive care can promote health and wellness. Preventive health guidelines for women include the following key practices.   A routine yearly physical is a good way to check with your health care provider about your health and preventive screening. It is a chance to share any concerns and updates on your health and to receive a thorough exam.   Visit your dentist for a routine exam and preventive care every 6 months. Brush your teeth twice a day and floss once a day. Good oral hygiene prevents tooth decay and gum disease.   The frequency of eye exams is based on your age, health, family medical history, use of contact lenses, and other factors. Follow your health care provider's recommendations for frequency of eye exams.   Eat a healthy diet. Foods like vegetables, fruits, whole grains, low-fat dairy products, and lean protein foods contain the nutrients you need without too many calories. Decrease your intake of foods high in solid fats, added sugars, and salt. Eat the right amount of calories for  you.Get information about a proper diet from your health care provider, if necessary.   Regular physical exercise is one of the most important things you can do for your health. Most adults should get at least 150 minutes of moderate-intensity exercise (any activity that increases your heart rate and causes you to sweat) each week. In addition, most adults need muscle-strengthening exercises on 2 or more days a week.   Maintain a healthy weight. The body mass index (BMI) is a screening tool to identify possible weight problems. It provides an estimate of body fat based on height and weight. Your health care provider can find your BMI, and can help you achieve or maintain a healthy weight.For adults 20 years and older:   - A BMI below 18.5 is considered underweight.   - A BMI of 18.5 to 24.9 is normal.   - A BMI of 25 to 29.9 is considered overweight.   - A BMI of 30 and above is considered obese.   Maintain normal blood lipids and cholesterol levels by exercising and minimizing your intake of trans and saturated fats.  Eat a balanced diet with plenty of fruit and vegetables. Blood tests for lipids and cholesterol should begin at age 66 and be repeated every 5 years minimum.  If your lipid or cholesterol levels are high, you are over 40, or you are at high risk for heart disease, you may need your  cholesterol levels checked more frequently.Ongoing high lipid and cholesterol levels should be treated with medicines if diet and exercise are not working.   If you smoke, find out from your health care provider how to quit. If you do not use tobacco, do not start.   Lung cancer screening is recommended for adults aged 30-80 years who are at high risk for developing lung cancer because of a history of smoking. A yearly low-dose CT scan of the lungs is recommended for people who have at least a 30-pack-year history of smoking and are a current smoker or have quit within the past 15 years. A pack year of  smoking is smoking an average of 1 pack of cigarettes a day for 1 year (for example: 1 pack a day for 30 years or 2 packs a day for 15 years). Yearly screening should continue until the smoker has stopped smoking for at least 15 years. Yearly screening should be stopped for people who develop a health problem that would prevent them from having lung cancer treatment.   If you are pregnant, do not drink alcohol. If you are breastfeeding, be very cautious about drinking alcohol. If you are not pregnant and choose to drink alcohol, do not have more than 1 drink per day. One drink is considered to be 12 ounces (355 mL) of beer, 5 ounces (148 mL) of wine, or 1.5 ounces (44 mL) of liquor.   Avoid use of street drugs. Do not share needles with anyone. Ask for help if you need support or instructions about stopping the use of drugs.   High blood pressure causes heart disease and increases the risk of stroke. Your blood pressure should be checked at least yearly.  Ongoing high blood pressure should be treated with medicines if weight loss and exercise do not work.   If you are 54-102 years old, ask your health care provider if you should take aspirin to prevent strokes.   Diabetes screening involves taking a blood sample to check your fasting blood sugar level. This should be done once every 3 years, after age 29, if you are within normal weight and without risk factors for diabetes. Testing should be considered at a younger age or be carried out more frequently if you are overweight and have at least 1 risk factor for diabetes.   Breast cancer screening is essential preventive care for women. You should practice "breast self-awareness."  This means understanding the normal appearance and feel of your breasts and may include breast self-examination.  Any changes detected, no matter how small, should be reported to a health care provider.  Women in their 40s and 30s should have a clinical breast exam (CBE) by  a health care provider as part of a regular health exam every 1 to 3 years.  After age 69, women should have a CBE every year.  Starting at age 80, women should consider having a mammogram (breast X-ray test) every year.  Women who have a family history of breast cancer should talk to their health care provider about genetic screening.  Women at a high risk of breast cancer should talk to their health care providers about having an MRI and a mammogram every year.   -Breast cancer gene (BRCA)-related cancer risk assessment is recommended for women who have family members with BRCA-related cancers. BRCA-related cancers include breast, ovarian, tubal, and peritoneal cancers. Having family members with these cancers may be associated with an increased risk for harmful changes (mutations) in  the breast cancer genes BRCA1 and BRCA2. Results of the assessment will determine the need for genetic counseling and BRCA1 and BRCA2 testing.   The Pap test is a screening test for cervical cancer. A Pap test can show cell changes on the cervix that might become cervical cancer if left untreated. A Pap test is a procedure in which cells are obtained and examined from the lower end of the uterus (cervix).   - Women should have a Pap test starting at age 40.   - Between ages 13 and 15, Pap tests should be repeated every 2 years.   - Beginning at age 35, you should have a Pap test every 3 years as long as the past 3 Pap tests have been normal.   - Some women have medical problems that increase the chance of getting cervical cancer. Talk to your health care provider about these problems. It is especially important to talk to your health care provider if a new problem develops soon after your last Pap test. In these cases, your health care provider may recommend more frequent screening and Pap tests.   - The above recommendations are the same for women who have or have not gotten the vaccine for human papillomavirus  (HPV).   - If you had a hysterectomy for a problem that was not cancer or a condition that could lead to cancer, then you no longer need Pap tests. Even if you no longer need a Pap test, a regular exam is a good idea to make sure no other problems are starting.   - If you are between ages 13 and 22 years, and you have had normal Pap tests going back 10 years, you no longer need Pap tests. Even if you no longer need a Pap test, a regular exam is a good idea to make sure no other problems are starting.   - If you have had past treatment for cervical cancer or a condition that could lead to cancer, you need Pap tests and screening for cancer for at least 20 years after your treatment.   - If Pap tests have been discontinued, risk factors (such as a new sexual partner) need to be reassessed to determine if screening should be resumed.   - The HPV test is an additional test that may be used for cervical cancer screening. The HPV test looks for the virus that can cause the cell changes on the cervix. The cells collected during the Pap test can be tested for HPV. The HPV test could be used to screen women aged 1 years and older, and should be used in women of any age who have unclear Pap test results. After the age of 61, women should have HPV testing at the same frequency as a Pap test.   Colorectal cancer can be detected and often prevented. Most routine colorectal cancer screening begins at the age of 7 years and continues through age 70 years. However, your health care provider may recommend screening at an earlier age if you have risk factors for colon cancer. On a yearly basis, your health care provider may provide home test kits to check for hidden blood in the stool.  Use of a small camera at the end of a tube, to directly examine the colon (sigmoidoscopy or colonoscopy), can detect the earliest forms of colorectal cancer. Talk to your health care provider about this at age 2, when routine screening  begins. Direct exam of the colon should be  repeated every 5 -10 years through age 72 years, unless early forms of pre-cancerous polyps or small growths are found.   People who are at an increased risk for hepatitis B should be screened for this virus. You are considered at high risk for hepatitis B if:  -You were born in a country where hepatitis B occurs often. Talk with your health care provider about which countries are considered high risk.  - Your parents were born in a high-risk country and you have not received a shot to protect against hepatitis B (hepatitis B vaccine).  - You have HIV or AIDS.  - You use needles to inject street drugs.  - You live with, or have sex with, someone who has Hepatitis B.  - You get hemodialysis treatment.  - You take certain medicines for conditions like cancer, organ transplantation, and autoimmune conditions.   Hepatitis C blood testing is recommended for all people born from [birth year] through 1965 and any individual with known risks for hepatitis C.   Practice safe sex. Use condoms and avoid high-risk sexual practices to reduce the spread of sexually transmitted infections (STIs). STIs include gonorrhea, chlamydia, syphilis, trichomonas, herpes, HPV, and human immunodeficiency virus (HIV). Herpes, HIV, and HPV are viral illnesses that have no cure. They can result in disability, cancer, and death. Sexually active women aged 14 years and younger should be checked for chlamydia. Older women with new or multiple partners should also be tested for chlamydia. Testing for other STIs is recommended if you are sexually active and at increased risk.   Osteoporosis is a disease in which the bones lose minerals and strength with aging. This can result in serious bone fractures or breaks. The risk of osteoporosis can be identified using a bone density scan. Women ages 25 years and over and women at risk for fractures or osteoporosis should discuss screening with their  health care providers. Ask your health care provider whether you should take a calcium supplement or vitamin D to There are also several preventive steps women can take to avoid osteoporosis and resulting fractures or to keep osteoporosis from worsening. -->Recommendations include:  Eat a balanced diet high in fruits, vegetables, calcium, and vitamins.  Get enough calcium. The recommended total intake of is 1,200 mg daily; for best absorption, if taking supplements, divide doses into 250-500 mg doses throughout the day. Of the two types of calcium, calcium carbonate is best absorbed when taken with food but calcium citrate can be taken on an empty stomach.  Get enough vitamin D. [employer] and the [employer] recommend at least 1,000 IU per day for women age 32 and over who are at risk of vitamin D deficiency. Vitamin D deficiency can be caused by inadequate sun exposure (for example, those who live in [place]).  Avoid alcohol and smoking. Heavy alcohol intake (more than 7 drinks per week) increases the risk of falls and hip fracture and women smokers tend to lose bone more rapidly and have lower bone mass than nonsmokers. Stopping smoking is one of the most important changes women can make to improve their health and decrease risk for disease.  Be physically active every day. Weight-bearing exercise (for example, fast walking, hiking, jogging, and weight training) may strengthen bones or slow the rate of bone loss that comes with aging. Balancing and muscle-strengthening exercises can reduce the risk of falling and fracture.  Consider therapeutic medications. Currently, several types of effective drugs are available. Healthcare providers can recommend  the type most appropriate for each woman.  Eliminate environmental factors that may contribute to accidents. Falls cause nearly 90% of all osteoporotic fractures, so reducing this risk is an important bone-health strategy.  Measures include ample lighting, removing obstructions to walking, using nonskid rugs on floors, and placing mats and/or grab bars in showers.  Be aware of medication side effects. Some common medicines make bones weaker. These include a type of steroid drug called glucocorticoids used for arthritis and asthma, some antiseizure drugs, certain sleeping pills, treatments for endometriosis, and some cancer drugs. An overactive thyroid gland or using too much thyroid hormone for an underactive thyroid can also be a problem. If you are taking these medicines, talk to your doctor about what you can do to help protect your bones.reduce the rate of osteoporosis.    Menopause can be associated with physical symptoms and risks. Hormone replacement therapy is available to decrease symptoms and risks. You should talk to your health care provider about whether hormone replacement therapy is right for you.   Use sunscreen. Apply sunscreen liberally and repeatedly throughout the day. You should seek shade when your shadow is shorter than you. Protect yourself by wearing long sleeves, pants, a wide-brimmed hat, and sunglasses year round, whenever you are outdoors.   Once a month, do a whole body skin exam, using a mirror to look at the skin on your back. Tell your health care provider of new moles, moles that have irregular borders, moles that are larger than a pencil eraser, or moles that have changed in shape or color.   -Stay current with required vaccines (immunizations).   Influenza vaccine. All adults should be immunized every year.  Tetanus, diphtheria, and acellular pertussis (Td, Tdap) vaccine. Pregnant women should receive 1 dose of Tdap vaccine during each pregnancy. The dose should be obtained regardless of the length of time since the last dose. Immunization is preferred during the 27th 36th week of gestation. An adult who has not previously received Tdap or who does not know her vaccine status  should receive 1 dose of Tdap. This initial dose should be followed by tetanus and diphtheria toxoids (Td) booster doses every 10 years. Adults with an unknown or incomplete history of completing a 3-dose immunization series with Td-containing vaccines should begin or complete a primary immunization series including a Tdap dose. Adults should receive a Td booster every 10 years.  Varicella vaccine. An adult without evidence of immunity to varicella should receive 2 doses or a second dose if she has previously received 1 dose. Pregnant females who do not have evidence of immunity should receive the first dose after pregnancy. This first dose should be obtained before leaving the health care facility. The second dose should be obtained 4 8 weeks after the first dose.  Human papillomavirus (HPV) vaccine. Females aged 7 26 years who have not received the vaccine previously should obtain the 3-dose series. The vaccine is not recommended for use in pregnant females. However, pregnancy testing is not needed before receiving a dose. If a female is found to be pregnant after receiving a dose, no treatment is needed. In that case, the remaining doses should be delayed until after the pregnancy. Immunization is recommended for any person with an immunocompromised condition through the age of 31 years if she did not get any or all doses earlier. During the 3-dose series, the second dose should be obtained 4 8 weeks after the first dose. The third dose should be obtained 24  weeks after the first dose and 16 weeks after the second dose.  Zoster vaccine. One dose is recommended for adults aged 70 years or older unless certain conditions are present.  Measles, mumps, and rubella (MMR) vaccine. Adults born before [birth year] generally are considered immune to measles and mumps. Adults born in [birth year] or later should have 1 or more doses of MMR vaccine unless there is a contraindication to the vaccine or there is laboratory evidence  of immunity to each of the three diseases. A routine second dose of MMR vaccine should be obtained at least 28 days after the first dose for students attending postsecondary schools, health care workers, or international travelers. People who received inactivated measles vaccine or an unknown type of measles vaccine during 1963 1967 should receive 2 doses of MMR vaccine. People who received inactivated mumps vaccine or an unknown type of mumps vaccine before 1979 and are at high risk for mumps infection should consider immunization with 2 doses of MMR vaccine. For females of childbearing age, rubella immunity should be determined. If there is no evidence of immunity, females who are not pregnant should be vaccinated. If there is no evidence of immunity, females who are pregnant should delay immunization until after pregnancy. Unvaccinated health care workers born before [birth year] who lack laboratory evidence of measles, mumps, or rubella immunity or laboratory confirmation of disease should consider measles and mumps immunization with 2 doses of MMR vaccine or rubella immunization with 1 dose of MMR vaccine.  Pneumococcal 13-valent conjugate (PCV13) vaccine. When indicated, a person who is uncertain of her immunization history and has no record of immunization should receive the PCV13 vaccine. An adult aged 76 years or older who has certain medical conditions and has not been previously immunized should receive 1 dose of PCV13 vaccine. This PCV13 should be followed with a dose of pneumococcal polysaccharide (PPSV23) vaccine. The PPSV23 vaccine dose should be obtained at least 8 weeks after the dose of PCV13 vaccine. An adult aged 16 years or older who has certain medical conditions and previously received 1 or more doses of PPSV23 vaccine should receive 1 dose of PCV13. The PCV13 vaccine dose should be obtained 1 or more years after the last PPSV23 vaccine dose.  Pneumococcal polysaccharide (PPSV23) vaccine. When  PCV13 is also indicated, PCV13 should be obtained first. All adults aged 81 years and older should be immunized. An adult younger than age 73 years who has certain medical conditions should be immunized. Any person who resides in a nursing home or long-term care facility should be immunized. An adult smoker should be immunized. People with an immunocompromised condition and certain other conditions should receive both PCV13 and PPSV23 vaccines. People with human immunodeficiency virus (HIV) infection should be immunized as soon as possible after diagnosis. Immunization during chemotherapy or radiation therapy should be avoided. Routine use of PPSV23 vaccine is not recommended for American Indians, [place] Natives, or people younger than 65 years unless there are medical conditions that require PPSV23 vaccine. When indicated, people who have unknown immunization and have no record of immunization should receive PPSV23 vaccine. One-time revaccination 5 years after the first dose of PPSV23 is recommended for people aged 43 64 years who have chronic kidney failure, nephrotic syndrome, asplenia, or immunocompromised conditions. People who received 1 2 doses of PPSV23 before age 2 years should receive another dose of PPSV23 vaccine at age 1 years or later if at least 5 years have passed since the previous dose. Doses of PPSV23  are not needed for people immunized with PPSV23 at or after age 85 years.  Meningococcal vaccine. Adults with asplenia or persistent complement component deficiencies should receive 2 doses of quadrivalent meningococcal conjugate (MenACWY-D) vaccine. The doses should be obtained at least 2 months apart. Microbiologists working with certain meningococcal bacteria, [employer] recruits, people at risk during an outbreak, and people who travel to or live in countries with a high rate of meningitis should be immunized. A first-year college student up through age 66 years who is living in a residence  hall should receive a dose if she did not receive a dose on or after her 16th birthday. Adults who have certain high-risk conditions should receive one or more doses of vaccine.  Hepatitis A vaccine. Adults who wish to be protected from this disease, have certain high-risk conditions, work with hepatitis A-infected animals, work in hepatitis A research labs, or travel to or work in countries with a high rate of hepatitis A should be immunized. Adults who were previously unvaccinated and who anticipate close contact with an international adoptee during the first 60 days after arrival in the [place] from a country with a high rate of hepatitis A should be immunized.  Hepatitis B vaccine.  Adults who wish to be protected from this disease, have certain high-risk conditions, may be exposed to blood or other infectious body fluids, are household contacts or sex partners of hepatitis B positive people, are clients or workers in certain care facilities, or travel to or work in countries with a high rate of hepatitis B should be immunized.  Haemophilus influenzae type b (Hib) vaccine. A previously unvaccinated person with asplenia or sickle cell disease or having a scheduled splenectomy should receive 1 dose of Hib vaccine. Regardless of previous immunization, a recipient of a hematopoietic stem cell transplant should receive a 3-dose series 6 12 months after her successful transplant. Hib vaccine is not recommended for adults with HIV infection.  Preventive Services / Frequency Ages 74 to 39years  Blood pressure check.** / Every 1 to 2 years.  Lipid and cholesterol check.** / Every 5 years beginning at age 51.  Clinical breast exam.** / Every 3 years for women in their 33s and 43s.  BRCA-related cancer risk assessment.** / For women who have family members with a BRCA-related cancer (breast, ovarian, tubal, or peritoneal cancers).  Pap test.** / Every 2 years from ages 81 through 66. Every 3 years  starting at age 75 through age 61 or 47 with a history of 3 consecutive normal Pap tests.  HPV screening.** / Every 3 years from ages 24 through ages 38 to 31 with a history of 3 consecutive normal Pap tests.  Hepatitis C blood test.** / For any individual with known risks for hepatitis C.  Skin self-exam. / Monthly.  Influenza vaccine. / Every year.  Tetanus, diphtheria, and acellular pertussis (Tdap, Td) vaccine.** / Consult your health care provider. Pregnant women should receive 1 dose of Tdap vaccine during each pregnancy. 1 dose of Td every 10 years.  Varicella vaccine.** / Consult your health care provider. Pregnant females who do not have evidence of immunity should receive the first dose after pregnancy.  HPV vaccine. / 3 doses over 6 months, if 70 and younger. The vaccine is not recommended for use in pregnant females. However, pregnancy testing is not needed before receiving a dose.  Measles, mumps, rubella (MMR) vaccine.** / You need at least 1 dose of MMR if you were born in  1955 or later. You may also need a 2nd dose. For females of childbearing age, rubella immunity should be determined. If there is no evidence of immunity, females who are not pregnant should be vaccinated. If there is no evidence of immunity, females who are pregnant should delay immunization until after pregnancy.  Pneumococcal 13-valent conjugate (PCV13) vaccine.** / Consult your health care provider.  Pneumococcal polysaccharide (PPSV23) vaccine.** / 1 to 2 doses if you smoke cigarettes or if you have certain conditions.  Meningococcal vaccine.** / 1 dose if you are age 60 to 24 years and a Market researcher living in a residence hall, or have one of several medical conditions, you need to get vaccinated against meningococcal disease. You may also need additional booster doses.  Hepatitis A vaccine.** / Consult your health care provider.  Hepatitis B vaccine.** / Consult your health care  provider.  Haemophilus influenzae type b (Hib) vaccine.** / Consult your health care provider.  Ages 57 to 64years  Blood pressure check.** / Every 1 to 2 years.  Lipid and cholesterol check.** / Every 5 years beginning at age 79 years.  Lung cancer screening. / Every year if you are aged 41 80 years and have a 30-pack-year history of smoking and currently smoke or have quit within the past 15 years. Yearly screening is stopped once you have quit smoking for at least 15 years or develop a health problem that would prevent you from having lung cancer treatment.  Clinical breast exam.** / Every year after age 53 years.  BRCA-related cancer risk assessment.** / For women who have family members with a BRCA-related cancer (breast, ovarian, tubal, or peritoneal cancers).  Mammogram.** / Every year beginning at age 45 years and continuing for as long as you are in good health. Consult with your health care provider.  Pap test.** / Every 3 years starting at age 77 years through age 15 or 59 years with a history of 3 consecutive normal Pap tests.  HPV screening.** / Every 3 years from ages 28 years through ages 2 to 68 years with a history of 3 consecutive normal Pap tests.  Fecal occult blood test (FOBT) of stool. / Every year beginning at age 32 years and continuing until age 94 years. You may not need to do this test if you get a colonoscopy every 10 years.  Flexible sigmoidoscopy or colonoscopy.** / Every 5 years for a flexible sigmoidoscopy or every 10 years for a colonoscopy beginning at age 39 years and continuing until age 47 years.  Hepatitis C blood test.** / For all people born from [birth year] through 1963 and any individual with known risks for hepatitis C.  Skin self-exam. / Monthly.  Influenza vaccine. / Every year.  Tetanus, diphtheria, and acellular pertussis (Tdap/Td) vaccine.** / Consult your health care provider. Pregnant women should receive 1 dose of Tdap vaccine during each  pregnancy. 1 dose of Td every 10 years.  Varicella vaccine.** / Consult your health care provider. Pregnant females who do not have evidence of immunity should receive the first dose after pregnancy.  Zoster vaccine.** / 1 dose for adults aged 10 years or older.  Measles, mumps, rubella (MMR) vaccine.** / You need at least 1 dose of MMR if you were born in 1957 or later. You may also need a 2nd dose. For females of childbearing age, rubella immunity should be determined. If there is no evidence of immunity, females who are not pregnant should be vaccinated. If there is no  evidence of immunity, females who are pregnant should delay immunization until after pregnancy.  Pneumococcal 13-valent conjugate (PCV13) vaccine.** / Consult your health care provider.  Pneumococcal polysaccharide (PPSV23) vaccine.** / 1 to 2 doses if you smoke cigarettes or if you have certain conditions.  Meningococcal vaccine.** / Consult your health care provider.  Hepatitis A vaccine.** / Consult your health care provider.  Hepatitis B vaccine.** / Consult your health care provider.  Haemophilus influenzae type b (Hib) vaccine.** / Consult your health care provider.  Ages 85 years and over  Blood pressure check.** / Every 1 to 2 years.  Lipid and cholesterol check.** / Every 5 years beginning at age 68 years.  Lung cancer screening. / Every year if you are aged 37 80 years and have a 30-pack-year history of smoking and currently smoke or have quit within the past 15 years. Yearly screening is stopped once you have quit smoking for at least 15 years or develop a health problem that would prevent you from having lung cancer treatment.  Clinical breast exam.** / Every year after age 80 years.  BRCA-related cancer risk assessment.** / For women who have family members with a BRCA-related cancer (breast, ovarian, tubal, or peritoneal cancers).  Mammogram.** / Every year beginning at age 66 years and continuing for  as long as you are in good health. Consult with your health care provider.  Pap test.** / Every 3 years starting at age 43 years through age 65 or 59 years with 3 consecutive normal Pap tests. Testing can be stopped between 65 and 70 years with 3 consecutive normal Pap tests and no abnormal Pap or HPV tests in the past 10 years.  HPV screening.** / Every 3 years from ages 104 years through ages 67 or 23 years with a history of 3 consecutive normal Pap tests. Testing can be stopped between 65 and 70 years with 3 consecutive normal Pap tests and no abnormal Pap or HPV tests in the past 10 years.  Fecal occult blood test (FOBT) of stool. / Every year beginning at age 63 years and continuing until age 75 years. You may not need to do this test if you get a colonoscopy every 10 years.  Flexible sigmoidoscopy or colonoscopy.** / Every 5 years for a flexible sigmoidoscopy or every 10 years for a colonoscopy beginning at age 35 years and continuing until age 46 years.  Hepatitis C blood test.** / For all people born from [birth year] through 1963 and any individual with known risks for hepatitis C.  Osteoporosis screening.** / A one-time screening for women ages 49 years and over and women at risk for fractures or osteoporosis.  Skin self-exam. / Monthly.  Influenza vaccine. / Every year.  Tetanus, diphtheria, and acellular pertussis (Tdap/Td) vaccine.** / 1 dose of Td every 10 years.  Varicella vaccine.** / Consult your health care provider.  Zoster vaccine.** / 1 dose for adults aged 58 years or older.  Pneumococcal 13-valent conjugate (PCV13) vaccine.** / Consult your health care provider.  Pneumococcal polysaccharide (PPSV23) vaccine.** / 1 dose for all adults aged 5 years and older.  Meningococcal vaccine.** / Consult your health care provider.  Hepatitis A vaccine.** / Consult your health care provider.  Hepatitis B vaccine.** / Consult your health care provider.  Haemophilus influenzae type  b (Hib) vaccine.** / Consult your health care provider. ** Family history and personal history of risk and conditions may change your health care provider's recommendations. Document Released: 06/30/2001 Document Revised: 02/22/2013  [employer] Patient Information 2014 [place].   EXERCISE AND DIET:  We recommended that you start or continue a regular exercise program for good health. Regular exercise means any activity that makes your heart beat faster and makes you sweat.  We recommend exercising at least 30 minutes per day at least 3 days a week, preferably 5.  We also recommend a diet low in fat and sugar / carbohydrates.  Inactivity, poor dietary choices and obesity can cause diabetes, heart attack, stroke, and kidney damage, among others.     ALCOHOL AND SMOKING:  Women should limit their alcohol intake to no more than 7 drinks/beers/glasses of wine (combined, not each!) per week. Moderation of alcohol intake to this level decreases your risk of breast cancer and liver damage.  ( And of course, no recreational drugs are part of a healthy lifestyle.)  Also, you should not be smoking at all or even being exposed to second hand smoke. Most people know smoking can cause cancer, and various heart and lung diseases, but did you know it also contributes to weakening of your bones?  Aging of your skin?  Yellowing of your teeth and nails?   CALCIUM AND VITAMIN D:  Adequate intake of calcium and Vitamin D are recommended.  The recommendations for exact amounts of these supplements seem to change often, but generally speaking 600 mg of calcium (either carbonate or citrate) and 800 units of Vitamin D per day seems prudent. Certain women may benefit from higher intake of Vitamin D.  If you are among these women, your doctor will have told you during your visit.     PAP SMEARS:  Pap smears, to check for cervical cancer or precancers,  have traditionally been done yearly, although recent scientific  advances have shown that most women can have pap smears less often.  However, every woman still should have a physical exam from her gynecologist or primary care physician every year. It will include a breast check, inspection of the vulva and vagina to check for abnormal growths or skin changes, a visual exam of the cervix, and then an exam to evaluate the size and shape of the uterus and ovaries.  And after 82 years of age, a rectal exam is indicated to check for rectal cancers. We will also provide age appropriate advice regarding health maintenance, like when you should have certain vaccines, screening for sexually transmitted diseases, bone density testing, colonoscopy, mammograms, etc.    MAMMOGRAMS:  All women over 42 years old should have a yearly mammogram. Many facilities now offer a "3D" mammogram, which may cost around $50 extra out of pocket. If possible,  we recommend you accept the option to have the 3D mammogram performed.  It both reduces the number of women who will be called back for extra views which then turn out to be normal, and it is better than the routine mammogram at detecting truly abnormal areas.     COLONOSCOPY:  Colonoscopy to screen for colon cancer is recommended for all women at age 60.  We know, you hate the idea of the prep.  We agree, BUT, having colon cancer and not knowing it is worse!!  Colon cancer so often starts as a polyp that can be seen and removed at colonscopy, which can quite literally save your life!  And if your first colonoscopy is normal and you have no family history of colon cancer, most women don't have to have it again for 10 years.  Once  every ten years, you can do something that may end up saving your life, right?  We will be happy to help you get it scheduled when you are ready.  Be sure to check your insurance coverage so you understand how much it will cost.  It may be covered as a preventative service at no cost, but you should check your  particular policy.

## 2018-04-28 NOTE — Progress Notes (Signed)
Impression and Recommendations:    1. Need for shingles vaccine   2. Need for Tdap vaccination   3. Encounter for screening for malignant neoplasm of respiratory organs   4. Estrogen deficiency   5. Encounter for screening for lung cancer   6. Encounter for Medicare annual wellness exam   7. Insomnia, unspecified type     - Extensive discussion held with patient today regarding recommendations, indications, and prudent preventative health management. All questions were answered during appointment.  - Discussed need for patient to continue to obtain screenings with her established specialists, including pulmonology and cardiology.  1) Anticipatory Guidance:  Discussed importance of wearing a seatbelt while driving, not texting while driving; sunscreen when outside along with yearly skin surveillance; eating a well balanced and modest diet; physical activity at least 25 minutes per day or 150 min/ week of moderate to intense activity.  - For help with sleeping, advised use of melatonin 10 mg extended release.  2) Immunizations / Screenings / Labs:  All immunizations and screenings that patient agrees to, are up-to-date per recommendations or will be updated today. Patient understands the needs for q 88modental and yearly vision screens which pt will schedule independently. Obtain CBC, CMP, HgA1c, Lipid panel, TSH and vit D when fasting if not already done recently.  - Last colonoscopy obtained in 2013.  - Patent with significant family history of female cancer, sister with uterine cancer and breast cancer in both breasts. Discussed recommendations for mammogram at length today. Reviewed pros and cons of obtaining further mammograms. After discussion, decided we will cease screening due to age 84 and fact that patient would not pursue treatment due to age.  - Need for DEXA. Last DEXA obtained in 2017. Discussed osteoporosis prevention and screening with patient today. Need for DEXA scan  reviewed at length with patient.  - Last CT scan obtained in 2012. Message sent to Dr. BLamonte Sakaitoday regarding patient's need for chest CT. Pulmonology will decide whether patient qualifies for lung CA screening. After discussion, reviewed with patient today that it is likely we will not pursue screening, as patient is age 89with significant COPD and may not be a candidate for treatment of any issues found.  - Need for Shingrix (shingles vaccine) discussed at length today. Handout provided to patient for further information.  3) Weight:  Improve nutrient density of diet through increasing intake of fruits and vegetables and decreasing saturated/trans fats, white flour products and refined sugar products.  4) Lifestyle & Preventative Health Maintenance:  - Advised patient to continue working toward prudent habits to improve overall mental, physical, and emotional health.  Health counseling performed. All questions answered.  - Reviewed the "spokes of the wheel" of mood and health management. Stressed the importance of ongoing prudent habits, including regular exercise, appropriate sleep hygiene, healthful dietary habits, and prayer/meditation to relax.  - Encouraged patient to engage in daily physical activity as tolerated, especially a formal exercise routine.  - Healthy dietary habits encouraged, including low-carb, and high amounts of lean protein in diet.  - Patient should also consume adequate amounts of water.   Meds ordered this encounter  Medications  . Tdap (BOOSTRIX) 5-2.5-18.5 LF-MCG/0.5 injection    Sig: Inject 0.5 mLs into the muscle once for 1 dose.    Dispense:  0.5 mL    Refill:  0  . Zoster Vaccine Adjuvanted ([facility] injection    Sig: Inject 0.5 mLs into the muscle once for 1 dose.  Dispense:  0.5 mL    Refill:  0  . ramelteon (ROZEREM) 8 MG tablet    Sig: Take 0.5 tablets (4 mg total) by mouth at bedtime.    Dispense:  90 tablet    Refill:  0    Please tell pt to  contact office for f/up OV prior to RF so we can assess txmnt efficacy    Orders Placed This Encounter  Procedures  . DG Bone Density  . CBC with Differential/Platelet  . Comprehensive metabolic panel  . Hemoglobin A1c  . Lipid panel  . T4, free  . TSH  . VITAMIN D 25 Hydroxy (Vit-D Deficiency, Fractures)  . Vitamin B12  . Folate  . Iron and TIBC  . Ferritin  . Transferrin     Subjective:    Chief Complaint  Patient presents with  . Medicare Wellness   HPI: [name] is a 84 y.o. female who presents to [place] [facility] today a yearly health maintenance exam.  Health Maintenance Summary Reviewed and updated, unless pt declines services.   Colonoscopy: Last colonoscopy was 2013.  Tobacco History Reviewed: Y; former smoker, quit in 2012. 0.9 ppd, 55.8 pack-years. She was still smoking 8 cigarettes per day in 2012.  CT scan for screening lung CA: Discussed during appointment today.  Alcohol: No concerns, no excessive use.  Exercise Habits: Not meeting AHA guidelines.  STD concerns: None.  Drug Use: None.  Birth control method: Postmenopausal.  Menses regular: Postmenopausal.  Lumps or breast concerns: No.  Breast Cancer Family History: No. Sister has had breast cancer in both breasts; two nieces with breast cancer. Sister had uterine cancer. Mother had bladder cancer. Father had lung cancer.  Bone/ DEXA scan: Last DEXA obtained in 2017.  States that she's breathing okay "with my med." Here today with nasal cannula in place and oxygen running. States during appointment today that she "knows that her COPD is bad."  Patient takes Vitamin D and Calcium every day, twice daily.  Notes today that she's gained three pounds thanks to baking Christmas cookies.  Denies concerns with hearing. Enthusiastically denies need for hearing aid. Describes a buzzing in her ears, commenting she does hear a "buzzing" everywhere in her house, "like there are bees buzzing  in my house."  Enthusiastically denies the need for a walker.  Denies napping during the day, but notes she often gets to sleep around 4 AM. She goes to bed around 12 o'clock, sleeps for an hour or two, and then "wakes up the rest of the night." Feels that restless leg is another big issues when she tries to sleep. Notes she was placed on iron in the past, which helped alleviate her restless legs.  Denies trouble breathing, denies trouble laying flat. States she takes melatonin every night, possibly 10 mg, "I don't know but I think it's ten."  Denies depression today. Confirms that she feels safe in her house, is not having trouble getting around, has not fallen over the past year, and has anti-skid pads in place for rugs.  Still follows up with cardiology and pulmonology as recommended    Health Maintenance Summary Reviewed and updated, unless pt declines services. Colonoscopy:  Last colonoscopy was 2013. Tobacco History Reviewed:   Y; former smoker, quit in 2012. 0.9 ppd, 55.8 pack-years. CT scan for screening lung CA:   Never done in the past by past physicians, will defer to pulmonology now Alcohol:  No concerns, no  excessive use. Exercise Habits:  Not meeting AHA guidelines.  STD concerns:  None. Drug Use:   None. Birth control method:  Postmenopausal. Menses regular:  Postmenopausal. Lumps or breast concerns:  No. Breast Cancer Family History:  No.  Sister has had breast cancer in both breasts; two nieces with breast cancer. Bone/ DEXA scan:  Last DEXA obtained in 2017.  States that she's breathing okay "with my med."  Here today with nasal cannula in place and oxygen running.  Patient takes Vitamin D and Calcium every day, twice daily.  Notes today that she's gained three pounds     Objective:     Vitals: BP 118/69   Pulse 67   Temp 98.5 F (36.9 C)   Ht _0  (1.626 m)   Wt 127 lb (57.6 kg)   SpO2 95%   BMI 21.80 kg/m   Body mass index is 21.8 kg/m.  Advanced  Directives 01/16/2017 11/03/2016 11/13/2015 11/11/2015 05/13/2011  Does Patient Have a Medical Advance Directive? No No;Yes Yes Yes Patient has advance directive, copy not in chart  Type of Advance Directive - Living will [place];Living will [place];Living will Living will;Healthcare Power of Attorney  Does patient want to make changes to medical advance directive? - - - No - Patient declined -  Copy of [place] in Chart? - - - No - copy requested Copy requested from family  Would patient like information on creating a medical advance directive? No - Patient declined - - - -  Pre-existing out of facility DNR order (yellow form or pink MOST form) - - - - No    Tobacco Social History   Tobacco Use  Smoking Status Former Smoker  . Packs/day: 0.90  . Years: 62.00  . Pack years: 55.80  . Types: Cigarettes  . Last attempt to quit: 07/14/2010  . Years since quitting: 7.7  Smokeless Tobacco Never Used     Counseling given: Not Answered     Past Medical History:  Diagnosis Date  . Allergic rhinitis   . Aortic stenosis   . Carotid artery disease ([place])   . Colon polyps   . h/o Atrial fibrillation ([place]) 03/22/2011   Brief post op   . Hematuria 11/05/2015  . Hypercalcemia   . Hypertension   . Hypothyroid   . Osteopenia   . 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 Pressure urticaria   . RLS (restless legs syndrome)   . S/P aortic valve replacement and aortoplasty 04/13/2011   38m Medtronic Freestyle porcine aortic root   . S/P aortic valve replacement with stentless valve 03/18/2011   Past Surgical History:  Procedure Laterality Date  . AORTIC ROOT REPLACEMENT  03/18/2011   220mMedtronic Freestyle Porcine aortic root with reimplantation of coronary arteries  . BREAST LUMPECTOMY     Left  . CATARACT EXTRACTION    . CHOLECYSTECTOMY    . FOOT SURGERY     Morton's neuroma  . KNEE ARTHROSCOPY     Left   Family History  Problem  Relation Age of Onset  . Cancer Mother        bladder  . Lung cancer Father   . Heart disease Maternal Grandmother   . Emphysema Maternal Aunt   . Asthma Maternal Aunt   . Asthma Maternal Uncle   . Breast cancer Sister   . Uterine cancer Sister    Social History   Socioeconomic History  . Marital status: Widowed  Spouse name: Not on file  . Number of children: N  . Years of education: Not on file  . Highest education level: Not on file  Occupational History  . Occupation: housewife  Social Needs  . Financial resource strain: Not on file  . Food insecurity:    Worry: Not on file    Inability: Not on file  . Transportation needs:    Medical: Not on file    Non-medical: Not on file  Tobacco Use  . Smoking status: Former Smoker    Packs/day: 0.90    Years: 62.00    Pack years: 55.80    Types: Cigarettes    Last attempt to quit: 07/14/2010    Years since quitting: 7.7  . Smokeless tobacco: Never Used  Substance and Sexual Activity  . Alcohol use: No    Alcohol/week: 0.0 standard drinks  . Drug use: No  . Sexual activity: Never  Lifestyle  . Physical activity:    Days per week: Not on file    Minutes per session: Not on file  . Stress: Not on file  Relationships  . Social connections:    Talks on phone: Not on file    Gets together: Not on file    Attends religious service: Not on file    Active member of club or organization: Not on file    Attends meetings of clubs or organizations: Not on file    Relationship status: Not on file  Other Topics Concern  . Not on file  Social History Narrative  . Not on file    Outpatient Encounter Medications as of 04/28/2018  Medication Sig  . albuterol (PROAIR HFA) 108 (90 Base) MCG/ACT inhaler Inhale 2 puffs into the lungs every 6 (six) hours as needed for wheezing or shortness of breath.  Marland Kitchen amLODipine (NORVASC) 2.5 MG tablet TAKE 1/2 (ONE-HALF) TABLET BY MOUTH ONCE DAILY  . aspirin 81 MG tablet Take 81 mg by mouth  daily.  . Calcium Carbonate-Vitamin D3 600-400 MG-UNIT TABS Take by mouth 2 (two) times daily.  . Cholecalciferol (VITAMIN D3) 5000 units CAPS Take 1 capsule by mouth daily.  Marland Kitchen levothyroxine (SYNTHROID, LEVOTHROID) 112 MCG tablet TAKE 1 TABLET BY MOUTH ONCE DAILY  . Multiple Vitamins-Minerals (MULTIVITAMIN WITH MINERALS) tablet Take 1 tablet by mouth daily.  . predniSONE (DELTASONE) 5 MG tablet TAKE 1 TABLET BY MOUTH ONCE DAILY WITH BREAKFAST  . simvastatin (ZOCOR) 20 MG tablet TAKE 1 TABLET BY MOUTH AT BEDTIME  . STIOLTO RESPIMAT 2.5-2.5 MCG/ACT AERS INHALE 2 PUFFS INTO THE LUNGS DAILY  . ramelteon (ROZEREM) 8 MG tablet Take 0.5 tablets (4 mg total) by mouth at bedtime.  . Tdap (BOOSTRIX) 5-2.5-18.5 LF-MCG/0.5 injection Inject 0.5 mLs into the muscle once for 1 dose.  Marland Kitchen Zoster Vaccine Adjuvanted [facility]) injection Inject 0.5 mLs into the muscle once for 1 dose.  . [DISCONTINUED] clindamycin (CLEOCIN) 300 MG capsule Take 1 capsule (300 mg total) by mouth 3 (three) times daily.  . [DISCONTINUED] ramelteon (ROZEREM) 8 MG tablet Take 0.5 tablets (4 mg total) by mouth at bedtime. (Patient not taking: Reported on 04/28/2018)   No facility-administered encounter medications on file as of 04/28/2018.     Activities of Daily Living In your present state of health, do you have any difficulty performing the following activities: 04/28/2018  Hearing? N  Vision? N  Difficulty concentrating or making decisions? N  Walking or climbing stairs? N  Dressing or bathing? N  Doing errands, shopping? N  Some recent data might be hidden    Patient Care Team: Mellody Dance, DO as PCP - General (Family Medicine) Belva Crome, MD as PCP - Cardiology (Cardiology) Rexene Alberts, MD (Cardiothoracic Surgery) Belva Crome, MD (Cardiology) Collene Gobble, MD as Consulting Physician (Pulmonary Disease) Magdalen Spatz, NP as Nurse Practitioner (Pulmonary Disease)    Assessment:   This is a routine  wellness examination for [name].  Exercise Activities and Dietary recommendations Current Exercise Habits: The patient does not participate in regular exercise at present  Goals   None     Fall Risk Fall Risk  04/28/2018 01/05/2018 10/19/2017 02/09/2017 01/20/2017  Falls in the past year? 0 No No Yes Yes  Number falls in past yr: - - - 1 1  Injury with Fall? - - - Yes Yes   Is the patient's home free of loose throw rugs in walkways, pet beds, electrical cords, etc?   yes      Grab bars in the bathroom? no      Handrails on the stairs?   no stairs      Adequate lighting?   yes  Timed Get Up and Go performed: normal  Depression Screen PHQ 2/9 Scores 04/28/2018 01/05/2018 10/19/2017 10/19/2017  PHQ - 2 Score 0 4 0 0  PHQ- 9 Score _0 -     Cognitive Function MMSE - Mini Mental State Exam 11/13/2015  Orientation to time 5  Orientation to Place 5  Registration 3  Attention/ Calculation 5  Recall 3  Language- name 2 objects 2  Language- repeat 1  Language- follow 3 step command 3  Language- read & follow direction 1  Write a sentence 1  Copy design 0  Total score 29     6CIT Screen 04/28/2018 02/09/2017 01/20/2017  What Year? 0 points 0 points 0 points  What month? 0 points 0 points 0 points  What time? 0 points 0 points 0 points  Count back from 20 0 points 0 points 0 points  Months in reverse 0 points 0 points 0 points  Repeat phrase 0 points 0 points 2 points  Total Score 0 0 2    Immunization History  Administered Date(s) Administered  . Influenza Whole 03/16/2012  . Influenza, High Dose Seasonal PF 02/09/2017, 03/02/2018  . Influenza,inj,Quad PF,6+ Mos 03/20/2013, 02/18/2015  . Influenza-Unspecified 02/15/2014, 12/17/2015, 02/09/2017, 03/02/2018  . Pneumococcal Conjugate-13 10/24/2013  . Pneumococcal Polysaccharide-23 02/09/2017    Qualifies for Shingles Vaccine? yes, info given  Screening Tests Health Maintenance  Topic Date Due  . TETANUS/TDAP  01/06/2019  (Originally 05/14/1949)  . INFLUENZA VACCINE  Completed  . DEXA SCAN  Completed  . PNA vac Low Risk Adult  Completed      I have personally reviewed and noted the following in the patient's chart:   . Medical and social history . Use of alcohol, tobacco or illicit drugs  . Current medications and supplements . Functional ability and status . Nutritional status . Physical activity . Advanced directives . List of other physicians . Hospitalizations, surgeries, and ER visits in previous 12 months . Vitals . Screenings to include cognitive, depression, and falls . Referrals and appointments  In addition, I have reviewed and discussed with patient certain preventive protocols, quality metrics, and best practice recommendations. A written personalized care plan for preventive services as well as general preventive health recommendations were provided to patient.     Mellody Dance, DO  04/28/2018

## 2018-04-29 LAB — CBC WITH DIFFERENTIAL/PLATELET
Basophils Absolute: 0 10*3/uL (ref 0.0–0.2)
Basos: 1 %
EOS (ABSOLUTE): 0.3 10*3/uL (ref 0.0–0.4)
EOS: 6 %
Hematocrit: 44.1 % (ref 34.0–46.6)
Hemoglobin: 14.7 g/dL (ref 11.1–15.9)
IMMATURE GRANS (ABS): 0 10*3/uL (ref 0.0–0.1)
Immature Granulocytes: 0 %
LYMPHS: 28 %
Lymphocytes Absolute: 1.4 10*3/uL (ref 0.7–3.1)
MCH: 32 pg (ref 26.6–33.0)
MCHC: 33.3 g/dL (ref 31.5–35.7)
MCV: 96 fL (ref 79–97)
MONOCYTES: 10 %
Monocytes Absolute: 0.5 10*3/uL (ref 0.1–0.9)
Neutrophils Absolute: 2.8 10*3/uL (ref 1.4–7.0)
Neutrophils: 55 %
Platelets: 165 10*3/uL (ref 150–450)
RBC: 4.59 x10E6/uL (ref 3.77–5.28)
RDW: 12.2 % — ABNORMAL LOW (ref 12.3–15.4)
WBC: 5.1 10*3/uL (ref 3.4–10.8)

## 2018-04-29 LAB — COMPREHENSIVE METABOLIC PANEL
ALT: 20 IU/L (ref 0–32)
AST: 29 IU/L (ref 0–40)
Albumin/Globulin Ratio: 1.5 (ref 1.2–2.2)
Albumin: 4 g/dL (ref 3.5–4.7)
Alkaline Phosphatase: 45 IU/L (ref 39–117)
BUN/Creatinine Ratio: 16 (ref 12–28)
BUN: 22 mg/dL (ref 8–27)
Bilirubin Total: 0.5 mg/dL (ref 0.0–1.2)
CO2: 31 mmol/L — ABNORMAL HIGH (ref 20–29)
Calcium: 10.9 mg/dL — ABNORMAL HIGH (ref 8.7–10.3)
Chloride: 99 mmol/L (ref 96–106)
Creatinine, Ser: 1.38 mg/dL — ABNORMAL HIGH (ref 0.57–1.00)
GFR calc Af Amer: 40 mL/min/{1.73_m2} — ABNORMAL LOW (ref >59)
GFR calc non Af Amer: 35 mL/min/{1.73_m2} — ABNORMAL LOW (ref >59)
Globulin, Total: 2.7 g/dL (ref 1.5–4.5)
Glucose: 83 mg/dL (ref 65–99)
Potassium: 3.8 mmol/L (ref 3.5–5.2)
Sodium: 146 mmol/L — ABNORMAL HIGH (ref 134–144)
TOTAL PROTEIN: 6.7 g/dL (ref 6.0–8.5)

## 2018-04-29 LAB — TRANSFERRIN: Transferrin: 209 mg/dL (ref 200–370)

## 2018-04-29 LAB — IRON AND TIBC
Iron Saturation: 51 % (ref 15–55)
Iron: 132 ug/dL (ref 27–139)
Total Iron Binding Capacity: 261 ug/dL (ref 250–450)
UIBC: 129 ug/dL (ref 118–369)

## 2018-04-29 LAB — FERRITIN: FERRITIN: 174 ng/mL — AB (ref 15–150)

## 2018-04-29 LAB — VITAMIN D 25 HYDROXY (VIT D DEFICIENCY, FRACTURES): Vit D, 25-Hydroxy: 78.4 ng/mL (ref 30.0–100.0)

## 2018-04-29 LAB — FOLATE: Folate: >20 ng/mL (ref >3.0)

## 2018-04-29 LAB — LIPID PANEL
Chol/HDL Ratio: 1.8 ratio (ref 0.0–4.4)
Cholesterol, Total: 173 mg/dL (ref 100–199)
HDL: 94 mg/dL (ref >39)
LDL Calculated: 61 mg/dL (ref 0–99)
Triglycerides: 88 mg/dL (ref 0–149)
VLDL CHOLESTEROL CAL: 18 mg/dL (ref 5–40)

## 2018-04-29 LAB — TSH: TSH: 3.35 u[IU]/mL (ref 0.450–4.500)

## 2018-04-29 LAB — HEMOGLOBIN A1C
Est. average glucose Bld gHb Est-mCnc: 114 mg/dL
Hgb A1c MFr Bld: 5.6 % (ref 4.8–5.6)

## 2018-04-29 LAB — VITAMIN B12: Vitamin B-12: 1131 pg/mL (ref 232–1245)

## 2018-04-29 LAB — T4, FREE: Free T4: 1.14 ng/dL (ref 0.82–1.77)

## 2018-05-03 ENCOUNTER — Other Ambulatory Visit (INDEPENDENT_AMBULATORY_CARE_PROVIDER_SITE_OTHER): Payer: Medicare Other

## 2018-05-03 DIAGNOSIS — Z1211 Encounter for screening for malignant neoplasm of colon: Secondary | ICD-10-CM | POA: Diagnosis not present

## 2018-05-03 LAB — IFOBT (OCCULT BLOOD)
IFOBT: NEGATIVE
IFOBT: NEGATIVE
IMMUNOLOGICAL FECAL OCCULT BLOOD TEST: NEGATIVE

## 2018-05-05 ENCOUNTER — Telehealth: Payer: Self-pay

## 2018-05-05 NOTE — Telephone Encounter (Signed)
Patient's granddaughter notified. MPulliam, CMA/RT(R)

## 2018-05-05 NOTE — Telephone Encounter (Signed)
-----  Message from Mellody Dance, DO sent at 05/04/2018 12:11 PM EST ----- Regarding: FW: quick Q about our mutual pt: Debarr Please let patient know that I spoke with her pulmonologist and we have decided it is best to defer a lung cancer screening with CT due to the fact that she has aged out and has other co-morbidities which would strongly influence her treatment options if she were ever diagnosed anyhow.  -Please let her know that I am more than happy to discuss this further with her if she wishes.  -Thanks Melissa,   Dr. Raliegh Scarlet   ----- Message ----- From: Collene Gobble, MD Sent: 05/03/2018  12:14 PM EST To: Mellody Dance, DO Subject: RE: quick Q about our mutual pt: [place]           Great question. The guidelines recommend that we screen people (with the right smoking hx) up until age 35.  I'd speculate that in a vigorous patient there might be some benefit to screening older as well, but that's where the study age cutoff landed. I'd agree with you that Ms Kinser's COPD would influence her treatment options, and therefore I'd defer screening LDCT in her.   Thanks very much,  Rob   ----- Message ----- From: Mellody Dance, DO Sent: 04/28/2018   8:42 AM EST To: Collene Gobble, MD Subject: quick Q about our mutual pt: Donna Bernard,  This is Nanetta Batty Ms. Stodghill's primary care doctor.  I just want to make sure you did not feel that a low-dose CT scan for lung cancer screening was warranted in our mutual patient who is going to turn 88 in the very near future.   She does have a family history of lung cancer in her father however, we did discuss the fact she has severe COPD and would likely not tolerate any treatments if there were a positive finding anyhow.  J  ust wanted make sure you are in agreement, and if you feel differently I can certainly send her your way for a consultation / office visit to discuss.  Thank you. Deb Opalski

## 2018-05-07 ENCOUNTER — Other Ambulatory Visit: Payer: Self-pay | Admitting: Emergency Medicine

## 2018-05-18 DIAGNOSIS — J449 Chronic obstructive pulmonary disease, unspecified: Secondary | ICD-10-CM | POA: Diagnosis not present

## 2018-06-18 DIAGNOSIS — J449 Chronic obstructive pulmonary disease, unspecified: Secondary | ICD-10-CM | POA: Diagnosis not present

## 2018-07-03 ENCOUNTER — Other Ambulatory Visit: Payer: Self-pay | Admitting: Family Medicine

## 2018-07-03 DIAGNOSIS — J438 Other emphysema: Secondary | ICD-10-CM

## 2018-07-03 DIAGNOSIS — I10 Essential (primary) hypertension: Secondary | ICD-10-CM

## 2018-07-17 DIAGNOSIS — J449 Chronic obstructive pulmonary disease, unspecified: Secondary | ICD-10-CM | POA: Diagnosis not present

## 2018-07-17 DIAGNOSIS — J438 Other emphysema: Secondary | ICD-10-CM | POA: Diagnosis not present

## 2018-07-18 ENCOUNTER — Other Ambulatory Visit: Payer: Self-pay | Admitting: Family Medicine

## 2018-07-25 DIAGNOSIS — M542 Cervicalgia: Secondary | ICD-10-CM | POA: Diagnosis not present

## 2018-08-17 DIAGNOSIS — J438 Other emphysema: Secondary | ICD-10-CM | POA: Diagnosis not present

## 2018-08-17 DIAGNOSIS — J449 Chronic obstructive pulmonary disease, unspecified: Secondary | ICD-10-CM | POA: Diagnosis not present

## 2018-08-19 ENCOUNTER — Other Ambulatory Visit: Payer: Self-pay | Admitting: Family Medicine

## 2018-08-20 ENCOUNTER — Other Ambulatory Visit: Payer: Self-pay | Admitting: Emergency Medicine

## 2018-08-22 ENCOUNTER — Other Ambulatory Visit: Payer: Self-pay

## 2018-08-22 MED ORDER — CALCIUM-VITAMIN D 600-400 MG-UNIT PO TABS: 1.0000 | ORAL_TABLET | Freq: Two times a day (BID) | ORAL | 1 refills | Status: DC

## 2018-09-16 DIAGNOSIS — J438 Other emphysema: Secondary | ICD-10-CM | POA: Diagnosis not present

## 2018-09-16 DIAGNOSIS — J449 Chronic obstructive pulmonary disease, unspecified: Secondary | ICD-10-CM | POA: Diagnosis not present

## 2018-09-28 ENCOUNTER — Other Ambulatory Visit: Payer: Self-pay | Admitting: Adult Health

## 2018-09-28 DIAGNOSIS — I10 Essential (primary) hypertension: Secondary | ICD-10-CM

## 2018-10-08 ENCOUNTER — Other Ambulatory Visit: Payer: Self-pay | Admitting: Adult Health

## 2018-10-08 DIAGNOSIS — J438 Other emphysema: Secondary | ICD-10-CM

## 2018-10-17 DIAGNOSIS — J438 Other emphysema: Secondary | ICD-10-CM | POA: Diagnosis not present

## 2018-10-17 DIAGNOSIS — J449 Chronic obstructive pulmonary disease, unspecified: Secondary | ICD-10-CM | POA: Diagnosis not present

## 2018-10-25 ENCOUNTER — Other Ambulatory Visit: Payer: Self-pay | Admitting: Family Medicine

## 2018-10-25 ENCOUNTER — Other Ambulatory Visit: Payer: Self-pay

## 2018-10-25 DIAGNOSIS — I10 Essential (primary) hypertension: Secondary | ICD-10-CM

## 2018-11-07 ENCOUNTER — Other Ambulatory Visit: Payer: Self-pay

## 2018-11-07 ENCOUNTER — Other Ambulatory Visit: Payer: Medicare Other

## 2018-11-07 DIAGNOSIS — I10 Essential (primary) hypertension: Secondary | ICD-10-CM | POA: Diagnosis not present

## 2018-11-08 ENCOUNTER — Ambulatory Visit (INDEPENDENT_AMBULATORY_CARE_PROVIDER_SITE_OTHER): Payer: Medicare Other | Admitting: Family Medicine

## 2018-11-08 ENCOUNTER — Encounter: Payer: Self-pay | Admitting: Family Medicine

## 2018-11-08 VITALS — BP 143/76 | HR 68 | Temp 98.2°F | Ht 64.0 in | Wt 116.0 lb

## 2018-11-08 DIAGNOSIS — I10 Essential (primary) hypertension: Secondary | ICD-10-CM | POA: Diagnosis not present

## 2018-11-08 DIAGNOSIS — N189 Chronic kidney disease, unspecified: Secondary | ICD-10-CM

## 2018-11-08 DIAGNOSIS — N289 Disorder of kidney and ureter, unspecified: Secondary | ICD-10-CM

## 2018-11-08 DIAGNOSIS — J432 Centrilobular emphysema: Secondary | ICD-10-CM

## 2018-11-08 DIAGNOSIS — Z954 Presence of other heart-valve replacement: Secondary | ICD-10-CM

## 2018-11-08 DIAGNOSIS — G4700 Insomnia, unspecified: Secondary | ICD-10-CM

## 2018-11-08 LAB — COMPREHENSIVE METABOLIC PANEL
ALT: 13 IU/L (ref 0–32)
AST: 16 IU/L (ref 0–40)
Albumin/Globulin Ratio: 1.2 (ref 1.2–2.2)
Albumin: 3.8 g/dL (ref 3.6–4.6)
Alkaline Phosphatase: 55 IU/L (ref 39–117)
BUN/Creatinine Ratio: 14 (ref 12–28)
BUN: 21 mg/dL (ref 8–27)
Bilirubin Total: 0.4 mg/dL (ref 0.0–1.2)
CO2: 31 mmol/L — ABNORMAL HIGH (ref 20–29)
Calcium: 12 mg/dL — ABNORMAL HIGH (ref 8.7–10.3)
Chloride: 98 mmol/L (ref 96–106)
Creatinine, Ser: 1.5 mg/dL — ABNORMAL HIGH (ref 0.57–1.00)
GFR calc Af Amer: 36 mL/min/{1.73_m2} — ABNORMAL LOW (ref >59)
GFR calc non Af Amer: 31 mL/min/{1.73_m2} — ABNORMAL LOW (ref >59)
Globulin, Total: 3.1 g/dL (ref 1.5–4.5)
Glucose: 108 mg/dL — ABNORMAL HIGH (ref 65–99)
Potassium: 4.2 mmol/L (ref 3.5–5.2)
Sodium: 145 mmol/L — ABNORMAL HIGH (ref 134–144)
Total Protein: 6.9 g/dL (ref 6.0–8.5)

## 2018-11-08 MED ORDER — AMLODIPINE BESYLATE 5 MG PO TABS: 2.5000 mg | ORAL_TABLET | Freq: Every day | ORAL | 0 refills | Status: DC

## 2018-11-08 NOTE — Progress Notes (Signed)
Telehealth office visit note for [name], D.O- at Primary Care at [facility]   I connected with current patient today and verified that I am speaking with the correct person using two identifiers.   . Location of the patient: Home . Location of the provider: Office Only the patient (+/- their family members at pt's discretion) and myself were participating in the encounter    - This visit type was conducted due to national recommendations for restrictions regarding the COVID-19 Pandemic (e.g. social distancing) in an effort to limit this patient's exposure and mitigate transmission in our community.  This format is felt to be most appropriate for this patient at this time.   - The patient did not have access to video technology or had technical difficulties with video requiring transitioning to audio format only. - No physical exam could be performed with this format, beyond that communicated to [place] by the patient/ family members as noted.   - Additionally my office staff/ schedulers discussed with the patient that there may be a monetary charge related to this service, depending on their medical insurance.   The patient expressed understanding, and agreed to proceed.       History of Present Illness: Last saw pt for chronic care back in 10/19/17. Told to f/up q 4 mo  Had R OM- acute OV-  01/05/18 Medicare wellness in 04/28/18   HTN:   Been running 144/73- that is the lowest.   Only occ checks it.  145/78, 177/82, high est 201 /94.   NO dizziness  Covid- sees two sons and sister on Friday- she stays with pt.  Hasn't been in store since Feb  Copd-  Stable.   Using O2-  2 L nasal O2.   Mood: feels well, denies depression or mood d/o  Sleep:  Not good- goes to bed around 12 pm- wakes couple hours later to pee and then trouble falling back to sleep.  Pt was given script for Rozerem 17m q hs back in 04/28/18---> pt states she did not take and currently does not take.   Pt  has been thinking about doing some legal work.  Has a irrevocable living trust.   Increase serum creatinine - 627mogo 1.38, now 1.5, w dec GFR slightly  Drinks tea all day-    Recent Results (from the past 2160 hour(s))  Comprehensive metabolic panel     Status: Abnormal   Collection Time: 11/07/18 10:01 AM  Result Value Ref Range   Glucose 108 (H) 65 - 99 mg/dL   BUN 21 8 - 27 mg/dL   Creatinine, Ser 1.50 (H) 0.57 - 1.00 mg/dL   GFR calc non Af Amer 31 (L) >59 mL/min/1.73   GFR calc Af Amer 36 (L) >59 mL/min/1.73   BUN/Creatinine Ratio 14 12 - 28   Sodium 145 (H) 134 - 144 mmol/L   Potassium 4.2 3.5 - 5.2 mmol/L   Chloride 98 96 - 106 mmol/L   CO2 31 (H) 20 - 29 mmol/L   Calcium 12.0 (H) 8.7 - 10.3 mg/dL   Total Protein 6.9 6.0 - 8.5 g/dL   Albumin 3.8 3.6 - 4.6 g/dL   Globulin, Total 3.1 1.5 - 4.5 g/dL   Albumin/Globulin Ratio 1.2 1.2 - 2.2   Bilirubin Total 0.4 0.0 - 1.2 mg/dL   Alkaline Phosphatase 55 39 - 117 IU/L   AST 16 0 - 40 IU/L   ALT 13 0 - 32  IU/L     Impression and Recommendations:    1. Essential hypertension   2. S/P aortic valve replacement with stentless valve   3. Centrilobular emphysema ([place])   4. Insomnia, unspecified type   5. Acute on chronic renal insufficiency      - serum crt increased from prior and BP has inc significantly from prior as well -  Renal fxn / GFR dec likely due to poorly controlled BP. --->  Re: BP:  3-4 wks- we will reach out to see how BP runnings.  Check BP about twice daily.   --->  Patient knows that, due to her COPD, her blood pressure will vary widely with any amount of exertion.  She will continue to monitor her blood pressure for extreme lows or extreme highs. --->  She will resume checking her blood pressure regularly and keeping a log- twice daily for now-  making sure to sit quietly for 15-20 minutes before measuring her blood pressure, for a more accurate reading with her condition.  I have d/with pt's grandaughter   - As part of my medical decision making, I reviewed the following data within the [place] History obtained from pt /family, CMA notes reviewed and incorporated if applicable, Labs reviewed, Radiograph/ tests reviewed if applicable and OV notes from prior OV's with me, as well as other specialists she/he has seen since seeing me last, were all reviewed and used in my medical decision making process today.   - Additionally, discussion had with patient regarding txmnt plan, and their biases/concerns about that plan were used in my medical decision making today.   - The patient agreed with the plan and demonstrated an understanding of the instructions.   No barriers to understanding were identified.   - Red flag symptoms and signs discussed in detail.  Patient expressed understanding regarding what to do in case of emergency\ urgent symptoms.  The patient was advised to call back or seek an in-person evaluation if the symptoms worsen or if the condition fails to improve as anticipated.   Return for 4 wks-telehealth for reckBP- inc norvasc from 1/2 to 1 tab qd 2.59m.    Meds ordered this encounter  Medications  . amLODipine (NORVASC) 5 MG tablet    Sig: Take 0.5 tablets (2.5 mg total) by mouth daily.    Dispense:  30 tablet    Refill:  0    Medications Discontinued During This Encounter  Medication Reason  . amLODipine (NORVASC) 2.5 MG tablet   . ramelteon (ROZEREM) 8 MG tablet Patient has not taken in last 30 days      I provided 26 minutes of non-face-to-face time during this encounter,with over 50% of the time in direct counseling on patients medical conditions/ medical concerns.  Additional time was spent with charting and coordination of care after the actual visit commenced.   Note:  This note was prepared with assistance of Dragon voice recognition software. Occasional wrong-word or sound-a-like substitutions may have occurred due to the inherent limitations of  voice recognition software.  DMellody Dance DO     Patient Care Team    Relationship Specialty Notifications Start End  OMellody Dance DO PCP - General Family Medicine  10/08/15   SBelva Crome MD PCP - Cardiology Cardiology Admissions 07/18/17   ORexene Alberts MD  Cardiothoracic Surgery  03/05/11   SBelva Crome MD  Cardiology  03/05/11   BCollene Gobble MD Consulting Physician Pulmonary Disease  11/05/15  Magdalen Spatz, NP Nurse Practitioner Pulmonary Disease  11/05/15      -Vitals obtained; medications/ allergies reconciled;  personal medical, social, Sx etc.histories were updated by CMA, reviewed by me and are reflected in chart   Patient Active Problem List   Diagnosis Date Noted  . History of smoking   ( 43 yrs at 0.5ppd) ;1991- until 2012 ( 8 cig/day )   02/09/2017    Priority: High  . S/P aortic valve replacement with stentless valve 03/18/2011    Priority: High  . Carotid artery disease ([place])     Priority: High  . Hypertension     Priority: High  . COPD (chronic obstructive pulmonary disease) with emphysema ([place]) 03/02/2011    Priority: High  . Osteopenia after menopause 07/07/2017    Priority: Medium  . Osteopenia 11/05/2015    Priority: Medium  . h/o Intention tremor  11/05/2015    Priority: Medium  . Oxygen dependent 11/05/2015    Priority: Low  . S/P aortic valve replacement and aortoplasty 04/13/2011    Priority: Low  . Insomnia 04/28/2018  . Iron deficiency anemia 04/28/2018  . Estrogen deficiency 04/28/2018  . H. pylori infection w/ GERD sx 10/19/2017  . Essential hypertension 10/19/2017  . Medication monitoring encounter 07/07/2017  . Family history of breast cancer in sister 07/07/2017  . Family history of bladder cancer-in mother 07/07/2017  . Elevated hemoglobin A1c 11/13/2015  . Hematuria 11/05/2015  . Hypothyroidism; on synthroid 11/05/2015  . h/o Hypercalcemia 11/05/2015  . h/o RLS (restless legs syndrome) 11/05/2015  . History  of nephrolithiasis 11/05/2015  . h/o Atrial fibrillation ([place]) 03/22/2011     Current Meds  Medication Sig  . albuterol (PROAIR HFA) 108 (90 Base) MCG/ACT inhaler Inhale 2 puffs into the lungs every 6 (six) hours as needed for wheezing or shortness of breath.  Marland Kitchen amLODipine (NORVASC) 5 MG tablet Take 0.5 tablets (2.5 mg total) by mouth daily.  Marland Kitchen aspirin 81 MG tablet Take 81 mg by mouth daily.  . Calcium Carb-Cholecalciferol (CALCIUM-VITAMIN D) 600-400 MG-UNIT TABS Take 1 tablet by mouth 2 (two) times daily.  . Cholecalciferol (VITAMIN D3) 5000 units CAPS Take 1 capsule by mouth daily.  Marland Kitchen levothyroxine (SYNTHROID, LEVOTHROID) 112 MCG tablet TAKE 1 TABLET BY MOUTH ONCE DAILY  . Melatonin 10 MG TABS Take by mouth.  . Multiple Vitamins-Minerals (MULTIVITAMIN WITH MINERALS) tablet Take 1 tablet by mouth daily.  . predniSONE (DELTASONE) 5 MG tablet Take 1 tablet by mouth once daily with breakfast  . simvastatin (ZOCOR) 20 MG tablet TAKE 1 TABLET BY MOUTH AT BEDTIME  . STIOLTO RESPIMAT 2.5-2.5 MCG/ACT AERS INHALE 2 PUFFS BY MOUTH ONCE DAILY  . [DISCONTINUED] amLODipine (NORVASC) 2.5 MG tablet Take 1/2 (one-half) tablet by mouth once daily     Allergies:  Allergies  Allergen Reactions  . Clarithromycin     Mouth/throat sore  . Tamsulosin Itching  . Trazodone And Nefazodone     Dizziness   . Penicillins Itching and Rash     ROS:  See above HPI for pertinent positives and negatives   Objective:   Blood pressure (!) 143/76, pulse 68, temperature 98.2 F (36.8 C), height _0  (1.626 m), weight 116 lb (52.6 kg).  (if some vitals are omitted, this means that patient was UNABLE to obtain them even though they were asked to get them prior to OV today.  They were asked to call us at their earliest convenience with these once obtained. )  General: A & O * 3; sounds in no acute distress; in usual state of health.  Skin: Pt confirms warm and dry extremities and pink fingertips HEENT: Pt  confirms lips non-cyanotic Chest: Patient confirms normal chest excursion and movement Respiratory: speaking in full sentences, no conversational dyspnea; patient confirms no use of accessory muscles Psych: insight appears good, mood- appears full

## 2018-11-16 DIAGNOSIS — J449 Chronic obstructive pulmonary disease, unspecified: Secondary | ICD-10-CM | POA: Diagnosis not present

## 2018-11-16 DIAGNOSIS — J438 Other emphysema: Secondary | ICD-10-CM | POA: Diagnosis not present

## 2018-11-30 ENCOUNTER — Other Ambulatory Visit: Payer: Self-pay

## 2018-11-30 ENCOUNTER — Encounter: Payer: Self-pay | Admitting: Family Medicine

## 2018-11-30 ENCOUNTER — Ambulatory Visit (INDEPENDENT_AMBULATORY_CARE_PROVIDER_SITE_OTHER): Payer: Medicare Other | Admitting: Family Medicine

## 2018-11-30 VITALS — BP 127/67 | HR 115 | Ht 64.0 in | Wt 115.0 lb

## 2018-11-30 DIAGNOSIS — R51 Headache: Secondary | ICD-10-CM | POA: Diagnosis not present

## 2018-11-30 DIAGNOSIS — F17200 Nicotine dependence, unspecified, uncomplicated: Secondary | ICD-10-CM

## 2018-11-30 DIAGNOSIS — I10 Essential (primary) hypertension: Secondary | ICD-10-CM

## 2018-11-30 DIAGNOSIS — F341 Dysthymic disorder: Secondary | ICD-10-CM | POA: Diagnosis not present

## 2018-11-30 DIAGNOSIS — R519 Headache, unspecified: Secondary | ICD-10-CM

## 2018-11-30 DIAGNOSIS — Z87891 Personal history of nicotine dependence: Secondary | ICD-10-CM | POA: Diagnosis not present

## 2018-11-30 DIAGNOSIS — R300 Dysuria: Secondary | ICD-10-CM

## 2018-11-30 DIAGNOSIS — R39198 Other difficulties with micturition: Secondary | ICD-10-CM

## 2018-11-30 MED ORDER — BUPROPION HCL 75 MG PO TABS: ORAL_TABLET | ORAL | 1 refills | Status: DC

## 2018-11-30 NOTE — Progress Notes (Signed)
Telehealth office visit note for [name], D.O- at Primary Care at [facility]   I connected with current patient today and verified that I am speaking with the correct person using two identifiers.   . Location of the patient: Home . Location of the provider: Office Only the patient (+/- their family members at pt's discretion) and myself were participating in the encounter    - This visit type was conducted due to national recommendations for restrictions regarding the COVID-19 Pandemic (e.g. social distancing) in an effort to limit this patient's exposure and mitigate transmission in our community.  This format is felt to be most appropriate for this patient at this time.   - The patient did not have access to video technology or had technical difficulties with video requiring transitioning to audio format only. - No physical exam could be performed with this format, beyond that communicated to [place] by the patient/ family members as noted.   - Additionally my office staff/ schedulers discussed with the patient that there may be a monetary charge related to this service, depending on their medical insurance.   The patient expressed understanding, and agreed to proceed.       History of Present Illness:  Last OV:  Inc Norvasc to 2.65m 1/2 tab of 545mdaily  143/66, 145/73, 152/74, 148/70, 140/62, 127/67 this am =  148/74,    ONLY one time was 192/93.  No Sx.  Pt has not been sitting for 10-15 min prior to checking, but this am when it was lower, she did rest.  Last 3-4 wks she occ wakes up with a headache.  Not localized but generalized.  Only a couple times per week.  She takes a tylenol- goes away completely.  No assoc sx otherwise.  Feels urinary pressure-  but can't pee a lot-  Going on for 5-6 months.  I don't leak and no so bothersome.  No burning, itching, blood etc, no abd pain, back pain  She would like to go back on buporprion to ehlp with her urges to smoke/  cravings, also can feel down at times Very mild per pt  [facility] 11/30/2018 11/08/2018 04/28/2018 01/05/2018 10/19/2017  Decreased Interest 0 0 0 2 0  Down, Depressed, Hopeless 0 0 0 2 0  PHQ - 2 Score 0 0 0 4 0  Altered sleeping 0 _0 Tired, decreased energy 0 0 _1 Change in appetite 0 0 1 2 0  Feeling bad or failure about yourself  0 0 1 0 0  Trouble concentrating 0 0 0 0 0  Moving slowly or fidgety/restless 0 0 0 0 0  Suicidal thoughts 0 0 0 0 0  PHQ-9 Score 0 _2 Difficult doing work/chores - - Somewhat difficult - Not difficult at all  Some recent data might be hidden      Impression and Recommendations:    1. Essential hypertension   2. Nonintractable episodic headache, unspecified headache type-occasionally in the morning   3. History of smoking   ( 43 yrs at 0.5ppd) ;1991- until 2012 ( 8 cig/day )     4. Dysthymia   5. Smoking addiction   6. Dysuria   7. Urinary stream slowing     - pt drinks around 50-60 oz / day.   Will obtain a UA.  ( cma to bring it to her home and then bring  back) Patient declines to come in and get an evaluation to see where her bladder lies etc.    -For headaches since it is just occasional in the mornings and relieved by Tylenol without any other symptoms associated with it, we will cont to monitor.  She understands if she develops any other associated symptoms or focal neurological symptoms she will let us know as we discussed she might need imaging for this and/ or emergency care depending on sx.   Also she will try to see if it is associated with caffeine intake, certain foods she eats, dehydration, blood pressure etc.   she understands if she develops any "red flag" symptoms she is to proceed to the emergency room.  -Blood pressure well controlled on current regiment mostly in the 140s over 70s on average when I reviewed ALL her BP's.   Explained she can occasionally have a low or a high but yet we need to protect her  against lows due to her age and potential risk of orthostatic hypotension with subseq fall etc.    We discussed her vessels do not accommodate as quickly at her age as they did when she was younger to changes in position.  She will guard against this and always have something to hold onto, as well as she will change positions slowly.  -For her smoking urges, patient asked to go back on Buproprion.  We discussed since she had no side effects prior and she would just use it occasionally as needed if she had desire, it worked well for her without side effect, we will prescribe this again after r/b d/c pt  - As part of my medical decision making, I reviewed the following data within the [place] History obtained from pt /family, CMA notes reviewed and incorporated if applicable, Labs reviewed, Radiograph/ tests reviewed if applicable and OV notes from prior OV's with me, as well as other specialists she/he has seen since seeing me last, were all reviewed and used in my medical decision making process today.   - Additionally, discussion had with patient regarding txmnt plan, and their biases/concerns about that plan were used in my medical decision making today.   - The patient agreed with the plan and demonstrated an understanding of the instructions.   No barriers to understanding were identified.   - Red flag symptoms and signs discussed in detail.  Patient expressed understanding regarding what to do in case of emergency\ urgent symptoms.  The patient was advised to call back or seek an in-person evaluation if the symptoms worsen or if the condition fails to improve as anticipated.   Return for Patient declined 6-week follow-up HA, will follow-up 3-4 months chronic conditions.    Meds ordered this encounter  Medications  . buPROPion (WELLBUTRIN) 75 MG tablet    Sig: 1/2 tablet twice daily for 2 weeks then 1 full tablet twice daily    Dispense:  180 tablet    Refill:  1    I  provided 21+ minutes of non-face-to-face time during this encounter,with over 50% of the time in direct counseling on patients medical conditions/ medical concerns.  Additional time was spent with charting and coordination of care after the actual visit commenced.   Note:  This note was prepared with assistance of Dragon voice recognition software. Occasional wrong-word or sound-a-like substitutions may have occurred due to the inherent limitations of voice recognition software.  Renee Dance, DO     Patient Care Team  Relationship Specialty Notifications Start End  Renee Dance, DO PCP - General Family Medicine  10/08/15   Belva Crome, MD PCP - Cardiology Cardiology Admissions 07/18/17   Rexene Alberts, MD  Cardiothoracic Surgery  03/05/11   Belva Crome, MD  Cardiology  03/05/11   Collene Gobble, MD Consulting Physician Pulmonary Disease  11/05/15   Magdalen Spatz, NP Nurse Practitioner Pulmonary Disease  11/05/15      -Vitals obtained; medications/ allergies reconciled;  personal medical, social, Sx etc.histories were updated by CMA, reviewed by me and are reflected in chart   Patient Active Problem List   Diagnosis Date Noted  . History of smoking   ( 43 yrs at 0.5ppd) ;1991- until 2012 ( 8 cig/day )   02/09/2017    Priority: High  . S/P aortic valve replacement with stentless valve 03/18/2011    Priority: High  . Carotid artery disease ([place])     Priority: High  . Hypertension     Priority: High  . COPD (chronic obstructive pulmonary disease) with emphysema ([place]) 03/02/2011    Priority: High  . Osteopenia after menopause 07/07/2017    Priority: Medium  . Osteopenia 11/05/2015    Priority: Medium  . h/o Intention tremor  11/05/2015    Priority: Medium  . Oxygen dependent 11/05/2015    Priority: Low  . S/P aortic valve replacement and aortoplasty 04/13/2011    Priority: Low  . Insomnia 04/28/2018  . Iron deficiency anemia 04/28/2018  . Estrogen deficiency  04/28/2018  . H. pylori infection w/ GERD sx 10/19/2017  . Essential hypertension 10/19/2017  . Medication monitoring encounter 07/07/2017  . Family history of breast cancer in sister 07/07/2017  . Family history of bladder cancer-in mother 07/07/2017  . Elevated hemoglobin A1c 11/13/2015  . Hematuria 11/05/2015  . Hypothyroidism; on synthroid 11/05/2015  . h/o Hypercalcemia 11/05/2015  . h/o RLS (restless legs syndrome) 11/05/2015  . History of nephrolithiasis 11/05/2015  . h/o Atrial fibrillation ([place]) 03/22/2011     Current Meds  Medication Sig  . albuterol (PROAIR HFA) 108 (90 Base) MCG/ACT inhaler Inhale 2 puffs into the lungs every 6 (six) hours as needed for wheezing or shortness of breath.  Marland Kitchen amLODipine (NORVASC) 5 MG tablet Take 0.5 tablets (2.5 mg total) by mouth daily.  Marland Kitchen aspirin 81 MG tablet Take 81 mg by mouth daily.  . Calcium Carb-Cholecalciferol (CALCIUM-VITAMIN D) 600-400 MG-UNIT TABS Take 1 tablet by mouth 2 (two) times daily.  . Cholecalciferol (VITAMIN D3) 5000 units CAPS Take 1 capsule by mouth daily.  Marland Kitchen levothyroxine (SYNTHROID, LEVOTHROID) 112 MCG tablet TAKE 1 TABLET BY MOUTH ONCE DAILY  . Melatonin 10 MG TABS Take by mouth.  . Multiple Vitamins-Minerals (MULTIVITAMIN WITH MINERALS) tablet Take 1 tablet by mouth daily.  . predniSONE (DELTASONE) 5 MG tablet Take 1 tablet by mouth once daily with breakfast  . simvastatin (ZOCOR) 20 MG tablet TAKE 1 TABLET BY MOUTH AT BEDTIME  . STIOLTO RESPIMAT 2.5-2.5 MCG/ACT AERS INHALE 2 PUFFS BY MOUTH ONCE DAILY     Allergies:  Allergies  Allergen Reactions  . Clarithromycin     Mouth/throat sore  . Tamsulosin Itching  . Trazodone And Nefazodone     Dizziness   . Penicillins Itching and Rash     ROS:  See above HPI for pertinent positives and negatives   Objective:   Blood pressure 127/67, pulse (!) 115, height _0  (1.626 m), weight 115 lb (52.2 kg).  (if  some vitals are omitted, this means that patient  was UNABLE to obtain them even though they were asked to get them prior to [place] today.  They were asked to call us at their earliest convenience with these once obtained. )  General: A & O * 3; sounds in no acute distress; in usual state of health.  Skin: Pt confirms warm and dry extremities and pink fingertips HEENT: Pt confirms lips non-cyanotic Chest: Patient confirms normal chest excursion and movement Respiratory: speaking in full sentences, no conversational dyspnea; patient confirms no use of accessory muscles Psych: insight appears good, mood- appears full

## 2018-12-01 ENCOUNTER — Other Ambulatory Visit (INDEPENDENT_AMBULATORY_CARE_PROVIDER_SITE_OTHER): Payer: Medicare Other

## 2018-12-01 DIAGNOSIS — R300 Dysuria: Secondary | ICD-10-CM | POA: Diagnosis not present

## 2018-12-01 DIAGNOSIS — R102 Pelvic and perineal pain: Secondary | ICD-10-CM | POA: Diagnosis not present

## 2018-12-01 DIAGNOSIS — R8290 Unspecified abnormal findings in urine: Secondary | ICD-10-CM | POA: Diagnosis not present

## 2018-12-01 LAB — POCT URINALYSIS DIPSTICK
Bilirubin, UA: NEGATIVE
Glucose, UA: NEGATIVE
Ketones, UA: NEGATIVE
Nitrite, UA: NEGATIVE
Protein, UA: NEGATIVE
Spec Grav, UA: 1.025 (ref 1.010–1.025)
Urobilinogen, UA: 0.2 E.U./dL
pH, UA: 7.5 (ref 5.0–8.0)

## 2018-12-01 NOTE — Addendum Note (Signed)
Addended by: Fonnie Mu on: 12/01/2018 11:33 AM   Modules accepted: Orders

## 2018-12-01 NOTE — Progress Notes (Signed)
u

## 2018-12-04 LAB — CULTURE, URINE COMPREHENSIVE

## 2018-12-05 ENCOUNTER — Other Ambulatory Visit: Payer: Self-pay | Admitting: Adult Health

## 2018-12-05 MED ORDER — CIPROFLOXACIN HCL 250 MG PO TABS: 250.0000 mg | ORAL_TABLET | Freq: Two times a day (BID) | ORAL | 0 refills | Status: DC

## 2018-12-08 ENCOUNTER — Other Ambulatory Visit: Payer: Self-pay | Admitting: Emergency Medicine

## 2018-12-17 DIAGNOSIS — J449 Chronic obstructive pulmonary disease, unspecified: Secondary | ICD-10-CM | POA: Diagnosis not present

## 2018-12-17 DIAGNOSIS — J438 Other emphysema: Secondary | ICD-10-CM | POA: Diagnosis not present

## 2019-01-16 ENCOUNTER — Other Ambulatory Visit: Payer: Self-pay | Admitting: Emergency Medicine

## 2019-01-17 DIAGNOSIS — J438 Other emphysema: Secondary | ICD-10-CM | POA: Diagnosis not present

## 2019-01-17 DIAGNOSIS — J449 Chronic obstructive pulmonary disease, unspecified: Secondary | ICD-10-CM | POA: Diagnosis not present

## 2019-01-18 ENCOUNTER — Other Ambulatory Visit: Payer: Self-pay | Admitting: Emergency Medicine

## 2019-01-18 ENCOUNTER — Other Ambulatory Visit: Payer: Self-pay | Admitting: Family Medicine

## 2019-01-18 DIAGNOSIS — E038 Other specified hypothyroidism: Secondary | ICD-10-CM

## 2019-01-18 DIAGNOSIS — J438 Other emphysema: Secondary | ICD-10-CM

## 2019-01-18 NOTE — Telephone Encounter (Signed)
Please advise, no scheduled follow up. Last seen 11/2018

## 2019-01-19 ENCOUNTER — Other Ambulatory Visit: Payer: Self-pay | Admitting: Emergency Medicine

## 2019-01-30 ENCOUNTER — Other Ambulatory Visit: Payer: Self-pay | Admitting: Family Medicine

## 2019-02-01 ENCOUNTER — Other Ambulatory Visit: Payer: Self-pay

## 2019-02-01 ENCOUNTER — Ambulatory Visit (INDEPENDENT_AMBULATORY_CARE_PROVIDER_SITE_OTHER): Payer: Medicare Other

## 2019-02-01 DIAGNOSIS — Z23 Encounter for immunization: Secondary | ICD-10-CM

## 2019-02-01 NOTE — Progress Notes (Signed)
Pt here for influenza vaccine.  Screening questionnaire reviewed, VIS provided to patient, and any/all patient questions answered.  Charyl Bigger, CMA

## 2019-02-04 ENCOUNTER — Other Ambulatory Visit: Payer: Self-pay | Admitting: Family Medicine

## 2019-02-04 DIAGNOSIS — I10 Essential (primary) hypertension: Secondary | ICD-10-CM

## 2019-02-09 ENCOUNTER — Ambulatory Visit: Payer: Medicare Other | Admitting: Emergency Medicine

## 2019-02-09 ENCOUNTER — Encounter: Payer: Self-pay | Admitting: Emergency Medicine

## 2019-02-09 ENCOUNTER — Other Ambulatory Visit: Payer: Self-pay

## 2019-02-09 VITALS — BP 136/90 | HR 65 | Ht 64.25 in | Wt 111.0 lb

## 2019-02-09 DIAGNOSIS — R634 Abnormal weight loss: Secondary | ICD-10-CM

## 2019-02-09 DIAGNOSIS — J9611 Chronic respiratory failure with hypoxia: Secondary | ICD-10-CM

## 2019-02-09 DIAGNOSIS — J438 Other emphysema: Secondary | ICD-10-CM

## 2019-02-09 NOTE — Patient Instructions (Addendum)
CXR today Please continue Stiolto 2 puffs once daily as you have been taking it. Keep albuterol available to use 2 puffs up to every 4 hours if needed for shortness of breath, chest tightness, wheezing.  Continue your oxygen at 2 L/min as you have been using it. Flu shot and pneumonia shot both up-to-date Follow with Dr. Lamonte Sakai in 12 months or sooner if you have any problems.

## 2019-02-09 NOTE — Progress Notes (Signed)
Subjective:    Patient ID: [name], female    DOB: [birth year], 85 y.o.   MRN: [id number]  HPI [name] is 17 and has a history of COPD and associated chronic hypoxemic respiratory failure.  She only intermittently desaturates on exertion and did not desaturate when we walked her last time.  She has purchased a portable oxygen concentrator and uses 2 L/min with exertion.  She is managed on Stiolto, rarely uses albuterol.  She reports that her breathing is stable - she has dyspnea when walking up hill. She still gets outside and ride lawnmower. Does her inside chores slowly - with her O2 on. Rarely uses albuterol. She is on stable dose of pred 30m daily. No flares, no exacerbations reported.  PNA vaccine up to date. Flu shot done last week. Minimal cough. She does have some chronic congestion. She has lost 13 lbs in 10 months, unintentional.    Review of Systems     Objective:   Physical Exam Vitals:   02/09/19 1125  BP: 136/90  Pulse: 65  SpO2: 96%  Weight: 111 lb (50.3 kg)  Height: 5' 4.25" (1.632 m)   Gen: Pleasant, well-nourished, in no distress,  normal affect  ENT: No lesions,  mouth clear,  oropharynx clear, no postnasal drip  Neck: No JVD, no stridor  Lungs: No use of accessory muscles, no crackles or wheezing on normal respiration, no wheeze on forced expiration  Cardiovascular: RRR, heart sounds normal, no murmur or gallops, no peripheral edema  Musculoskeletal: No deformities, no cyanosis or clubbing  Neuro: alert, awake, non focal  Skin: Warm, no lesions or rash     Assessment & Plan:  Unintentional weight loss Etiology unclear.  No findings on exam.  She needs a chest x-ray to rule out any evidence of malignancy.  COPD (chronic obstructive pulmonary disease) with emphysema (H[facility]) Continue Stiolto, albuterol as needed.  Vaccines up-to-date.  No exacerbations since last time  [place] She has noted desaturations with  exertion when she does not wear submental oxygen.  She has good oxygen compliance, wears 2 L/min.  We will continue same.  Renee Apo MD, PhD 02/09/2019, 12:02 PM [facility] [phone] if no answer [phone]

## 2019-02-09 NOTE — Assessment & Plan Note (Signed)
Etiology unclear.  No findings on exam.  She needs a chest x-ray to rule out any evidence of malignancy.

## 2019-02-09 NOTE — Assessment & Plan Note (Signed)
She has noted desaturations with exertion when she does not wear submental oxygen.  She has good oxygen compliance, wears 2 L/min.  We will continue same.

## 2019-02-09 NOTE — Assessment & Plan Note (Signed)
Continue Stiolto, albuterol as needed.  Vaccines up-to-date.  No exacerbations since last time

## 2019-02-15 ENCOUNTER — Telehealth: Payer: Self-pay | Admitting: Family Medicine

## 2019-02-15 ENCOUNTER — Other Ambulatory Visit: Payer: Self-pay

## 2019-02-15 DIAGNOSIS — G4452 New daily persistent headache (NDPH): Secondary | ICD-10-CM

## 2019-02-15 NOTE — Telephone Encounter (Signed)
Per Dr. Hershal Coria verbal orders, CT of brain w/o contrast ordered.  Pt informed.  Pt expressed understanding and is agreeable.  Charyl Bigger, CMA

## 2019-02-15 NOTE — Telephone Encounter (Signed)
Do the headaches wake her up from sleep at night?  And/or are the worst headaches of her life?  Is she experiencing any visual changes or focal neurological symptoms with these headaches?  -In terms of the constipation, it is important that she exercise and get adequate amounts of water intake.  Also, MiraLAX would be what I would recommend for constipation if she is moving and getting adequate amounts of water.

## 2019-02-15 NOTE — Telephone Encounter (Signed)
Pt states that the headaches wake her up during the night and she rates that pain 7/10, stating that they are the worst headaches of her life given that she has "never had headaches in her life".  Pt denies visual changes, worsening of tremors, gait disturbance, etc.  Pt also advised re: water intake and use of Miralax for constipation.  Please advise.  Charyl Bigger, CMA

## 2019-02-15 NOTE — Telephone Encounter (Signed)
Patient called requested Appt w/ provider sighting Erratic Blood pressure reading, Waking up w/ Headaches & unexplained wt. Loss---  Appt made for 1st available 10/13 & also forwarding message to medical assistant to review Pt's symptoms  w/provider & contact her with any instructions.  --glh

## 2019-02-15 NOTE — Telephone Encounter (Signed)
Pt states that she typically goes to bed around midnight and has been waking up around 2-3am with headaches that typically last until approximately noon.  The headaches start at the top of her head and radiate to the back of her head.   She also states that she has had a 13 lb weight loss in the last 11 months.  Denies dizziness, chest pain, increased SOB, nausea and vomiting, diarrhea or abdominal pain.  She does state that she takes stool softeners but still experiences constipation.  BPs have been erratic with most recent readings being:  118/60 148/68 178/93 170/77 144/62 163/69 129/75 136/90 129/66 146/65  She states that she takes her medications in the morning and then waits one hour to check BP.  She has an appt scheduled with you for your first available on 02/28/2019.  Please advise if any further evaluation/treatment is warranted prior to OV.  Charyl Bigger, CMA

## 2019-02-15 NOTE — Progress Notes (Signed)
ct

## 2019-02-16 DIAGNOSIS — J449 Chronic obstructive pulmonary disease, unspecified: Secondary | ICD-10-CM | POA: Diagnosis not present

## 2019-02-16 DIAGNOSIS — J438 Other emphysema: Secondary | ICD-10-CM | POA: Diagnosis not present

## 2019-02-23 ENCOUNTER — Ambulatory Visit
Admission: RE | Admit: 2019-02-23 | Discharge: 2019-02-23 | Disposition: A | Discharge status: Home / Self Care | Payer: Medicare Other | Source: Ambulatory Visit | Attending: Family Medicine | Admitting: Family Medicine

## 2019-02-23 DIAGNOSIS — I6523 Occlusion and stenosis of bilateral carotid arteries: Secondary | ICD-10-CM | POA: Diagnosis not present

## 2019-02-23 DIAGNOSIS — G4452 New daily persistent headache (NDPH): Secondary | ICD-10-CM

## 2019-02-23 DIAGNOSIS — G3189 Other specified degenerative diseases of nervous system: Secondary | ICD-10-CM | POA: Diagnosis not present

## 2019-02-25 ENCOUNTER — Other Ambulatory Visit: Payer: Self-pay | Admitting: Emergency Medicine

## 2019-02-25 ENCOUNTER — Encounter: Payer: Self-pay | Admitting: Family Medicine

## 2019-02-28 ENCOUNTER — Ambulatory Visit (INDEPENDENT_AMBULATORY_CARE_PROVIDER_SITE_OTHER): Payer: Medicare Other | Admitting: Family Medicine

## 2019-02-28 ENCOUNTER — Encounter: Payer: Self-pay | Admitting: Family Medicine

## 2019-02-28 ENCOUNTER — Other Ambulatory Visit: Payer: Self-pay

## 2019-02-28 VITALS — BP 131/55 | HR 71 | Resp 16 | Ht 64.0 in | Wt 115.5 lb

## 2019-02-28 DIAGNOSIS — G4452 New daily persistent headache (NDPH): Secondary | ICD-10-CM | POA: Diagnosis not present

## 2019-02-28 DIAGNOSIS — F341 Dysthymic disorder: Secondary | ICD-10-CM

## 2019-02-28 DIAGNOSIS — I6523 Occlusion and stenosis of bilateral carotid arteries: Secondary | ICD-10-CM | POA: Diagnosis not present

## 2019-02-28 DIAGNOSIS — Z952 Presence of prosthetic heart valve: Secondary | ICD-10-CM

## 2019-02-28 DIAGNOSIS — Z8052 Family history of malignant neoplasm of bladder: Secondary | ICD-10-CM

## 2019-02-28 DIAGNOSIS — G2581 Restless legs syndrome: Secondary | ICD-10-CM

## 2019-02-28 DIAGNOSIS — F17200 Nicotine dependence, unspecified, uncomplicated: Secondary | ICD-10-CM

## 2019-02-28 DIAGNOSIS — Z87891 Personal history of nicotine dependence: Secondary | ICD-10-CM

## 2019-02-28 DIAGNOSIS — R7303 Prediabetes: Secondary | ICD-10-CM

## 2019-02-28 DIAGNOSIS — I480 Paroxysmal atrial fibrillation: Secondary | ICD-10-CM

## 2019-02-28 DIAGNOSIS — Z862 Personal history of diseases of the blood and blood-forming organs and certain disorders involving the immune mechanism: Secondary | ICD-10-CM

## 2019-02-28 DIAGNOSIS — N1832 Chronic kidney disease, stage 3b: Secondary | ICD-10-CM

## 2019-02-28 DIAGNOSIS — E559 Vitamin D deficiency, unspecified: Secondary | ICD-10-CM

## 2019-02-28 DIAGNOSIS — R350 Frequency of micturition: Secondary | ICD-10-CM | POA: Insufficient documentation

## 2019-02-28 DIAGNOSIS — E785 Hyperlipidemia, unspecified: Secondary | ICD-10-CM

## 2019-02-28 MED ORDER — BUPROPION HCL 75 MG PO TABS: ORAL_TABLET | ORAL | 1 refills | Status: DC

## 2019-02-28 NOTE — Patient Instructions (Addendum)
Please call for cardiology follow-up with Dr. Tamala Julian.  You are due.  When you call them please ask about carotid ultrasound as you are due to get that repeated as well.  It might be good to get that test done before you see Dr. Tamala Julian so that he can have those results in hand.      Come in for FBW near future

## 2019-02-28 NOTE — Progress Notes (Signed)
Impression and Recommendations:    1. New daily persistent headache-  now resolved   2. S/P aortic valve replacement and aortoplasty   3. Bilateral carotid artery stenosis   4. Restless legs syndrome (RLS)   5. Stage 3b chronic kidney disease   6. Prediabetes   7. Urinary frequency- keeping her up at night as she urinates several times   8. History of iron deficiency anemia   9. History of smoking   ( 43 yrs at 0.5ppd) ;1991- until 2012 ( 8 cig/day )     10. Dysthymia   11. h/o Smoking addiction   12. Family history of bladder cancer- Mom   19. Vitamin D deficiency   14. Hyperlipidemia, unspecified hyperlipidemia type   15. Paroxysmal atrial fibrillation (HCC)     COVID Counseling - Novel Covid -19 counseling done; all questions were answered.   - Current CDC / federal and [place] guidelines reviewed with patient  - Reminded pt of extreme importance of social distancing; wearing a mask when out in public; insensate handwashing and cleaning of surfaces, avoiding unnecessary trips for shopping and avoiding ALL but emergency appts etc. - Told patient to be prepared, not scared; and be smart for the sake of others - Patient will call with any additional concerns   New Daily Persistent Headaches - Now Resolved - Patient was having daily persistent headaches and underwent recent imaging, CT head without contrast on 02/24/2019. - Reviewed in great detail recent imaging notes, clinical lab tests, tests in the radiology section of CPT, tests in the medicine testing of [facility], and obtained history from patient - Per patient, now resolved, no concerns.  - Strongly advised patient to use her oxygen nightly as prescribed.  Discussed how these can cause headaches in the middle of not the night when her oxygen falls off which patient confirms happens from time to time  - Will continue to monitor.   Bilateral Carotid Artery Stenosis; S/P Aortic Valve Replacement & Aortoplasty - Last  carotid duplex done 04/16/2017, by Dr. Tamala Julian.  Due this year -Per cardiology they get them every two years.  - Patient with history of aortic valve replacement is due to see cardiologist and she will call for office visit.   ---> She will discuss with cardiologist about obtaining new carotid ultrasound study as last check was 2018, and 2016 prior.  - Patient knows to continue to obtain follow-up through cardiology as discussed.  - Will continue to monitor.   Restless Legs Syndrome - History of Iron Deficiency Anemia - Patient with history of treating RLS in the past. - Took iron supplementation in the past, but has since discontinued. - Discussed that if patient's iron was normal on supplementation, she should continue. - Discussed that RLS can be caused by iron deficiency as well as other things. -We will determine further treatment plan based on labs obtained - Discussed critical importance of hydration. - Lab work drawn in near future.  See orders. - Will continue to monitor.   History of Smoking Addiction (treated with Wellbutrin) -and now dysthymia - Advised patient to take her mood medication daily. -Even though I told patient this is not the preferred medicine for mood in the elderly, patient feels this medicine works well for her, she tolerates it well and patient wishes to continue using it versus switching to a different one - Patient agrees to try taking a half tablet in the morning, and half tablet in the  evening.  - Reviewed the "spokes of the wheel" of mood and health management.  Stressed the importance of ongoing prudent habits, including regular exercise, appropriate sleep hygiene, healthful dietary habits, and prayer/meditation to relax.  - Will continue to monitor.   Stage 3b Chronic Kidney Disease - Need for re-check.  Labs drawn in near future. - If kidney function continues to worsen, discussed need in future for referral to nephrology. - Discussed we may  obtain ultrasound of kidneys prior to future referral to nephrology. - Will continue to monitor.   Frequent Urination - Family Hx Bladder Cancer (in mother) - Patient urinating frequently at night, preventing her from sleeping. - Given family hx of bladder cancer, discussed need for her to follow-up with her urologist.  Told her and her granddaughter [name] to call for appointment.  She was seen in the past by them with having a cystoscopy which was normal but, if patient continues with symptoms, I recommend she follow-up with them as due to family history, things can change due to age. - Will continue to monitor.   Lifestyle & Preventative Health Maintenance - Advised patient to continue working toward exercising to improve overall mental, physical, and emotional health.    - Encouraged patient to engage in daily physical activity as tolerated, especially a formal exercise routine.  Recommended that the patient eventually strive for at least 150 minutes of moderate cardiovascular activity per week according to guidelines established by the [facility].   - Healthy dietary habits encouraged, including low-carb, and high amounts of lean protein in diet.   - Patient should also consume adequate amounts of water.  - Health counseling performed.  All questions answered.  Recommendations - Patient will return tomorrow or the next day for full fasting blood work. - Advised patient to follow up with urology ASAP given frequency of nightly urination and family Hx of bladder cancer.   Orders Placed This Encounter  Procedures  . Comprehensive metabolic panel  . CBC with Differential/Platelet  . Lipid panel  . Hemoglobin A1c  . Magnesium  . Phosphorus  . T4, free  . T3  . TSH  . Vitamin B12  . VITAMIN D 25 Hydroxy (Vit-D Deficiency, Fractures)  . Microalbumin / creatinine urine ratio  . B12 and Folate Panel  . Transferrin  . Reticulocytes  . Ferritin  . Iron and TIBC  . Ambulatory  referral to Urology    Meds ordered this encounter  Medications  . buPROPion (WELLBUTRIN) 75 MG tablet    Sig: 1/2 tablet twice daily for 2 weeks then 1 full tablet twice daily    Dispense:  180 tablet    Refill:  1    Medications Discontinued During This Encounter  Medication Reason  . buPROPion (WELLBUTRIN) 75 MG tablet Reorder     Patient was interviewed and evaluated by me in the clinic today for 45++ minutes, with over 50% of my time spent in face to face counseling of patients various medical conditions, treatment plans of those medical conditions including medicine management and lifestyle modification, strategies to improve health and well being; and in coordination of care.   SEE TREATMENT PLAN FOR DETAILS -Additional time was also spent discussing plan of care with patient's granddaughter, [name], CMA  Gross side effects, risk and benefits, and alternatives of medications and treatment plan in general discussed with patient.  Patient is aware that all medications have potential side effects and we are unable to predict every side effect  or drug-drug interaction that may occur.   Patient will call with any questions prior to using medication if they have concerns.    Expresses verbal understanding and consents to current therapy and treatment regimen.  No barriers to understanding were identified.  Red flag symptoms and signs discussed in detail.  Patient expressed understanding regarding what to do in case of emergency\urgent symptoms  Please see AVS handed out to patient at the end of our visit for further patient instructions/ counseling done pertaining to today's office visit.   Return for FBW tom or next day  .     Note:  This note was prepared with assistance of Dragon voice recognition software. Occasional wrong-word or sound-a-like substitutions may have occurred due to the inherent limitations of voice recognition software.   This document serves as a record of  services personally performed by Mellody Dance, DO. It was created on her behalf by Toni Amend, a trained medical scribe. The creation of this record is based on the scribe's personal observations and the provider's statements to them.   I have reviewed the above medical documentation for accuracy and completeness and I concur.  Mellody Dance, DO 03/01/2019 2:00 PM        --------------------------------------------------------------------------------------------------------------------------------------------------------------------------------------------------------------------------------------------    Subjective:     HPI: [name] is a 85 y.o. female who presents to [place] at [facility] today for issues as discussed below.  She has continued going to church, not socially distanced in church.  Notes takes "handful of pills every morning and handful of pills every night," and wishes to discontinue some.  - Recent CT scan for daily headaches; now resolved Notes hasn't had any headaches; "when I went and had my brain checked, they said it was getting old."  Headaches have overall resolved since recent concerns.  Says sometimes she gets up in the night to go to the bathroom and has a headache, but "not enough that I can't go back to sleep."  Confirms wearing her oxygen at night, "if I don't throw it off during the night."  - Frequent Urinating at Night Notes has to go to the bathroom every two hours per night.  Thinks she has been urinating more frequently for "maybe 18 months."  Notes "my mother's cancer started in her bladder."  Denies frequent urination during the day.  Notes she gets up in the morning and empties her bladder, and doesn't have to urinate again until 4-5 o'clock.  - Concerns about Eating, states "worried I'm eating too much" Says "right now I feel like I'm eating too much."  Says "I got down to 109 and I'm up to 111  lbs now" on her scale at home.  Weighed 124 lbs around a year ago, but has been weighing around 111 to 115.  Says her youngest son cooks and brings her food "all the time."  Notes "he's a good cook and been divorced now for years."  Thinks she's eating the same amount as prior, and adds "I feel like I'm eating too many cookies; I make cookies every week."  Says today she got an orange, "a couple of apples and a head of broccoli," some "nice ham and tomato and am going to have a ham and tomato sandwich sometime today."  - Mood & Smoking Cessation Notes "I'm in the house all the time.  It's been since February, and last week is the first time I've been in the grocery store since February."  Says "I don't sleep well, and it's just boredom."  Notes only taking half tablet of Wellbutrin twice per week. "when I want a cigarette, I'll get a half a pill."  Patient is not taking Wellbutrin daily.  - Jerking Legs States "I want something for restless legs."  Says she lays down some nights and her legs are jerking.   Notes this happens just when she's laying in bed.  "You can't go to sleep when your legs are jerking."  She has no idea what she took in the past for restless legs.  Notes someone told her to take iron pills in the past, and she has a history of iron deficiency, but has not taken the iron pills in some time.  - Hydration States "I'm forcing the fluids; every morning now I have to drink a full glass of water with Miralax."    Wt Readings from Last 3 Encounters:  02/28/19 115 lb 8 oz (52.4 kg)  02/09/19 111 lb (50.3 kg)  11/30/18 115 lb (52.2 kg)   BP Readings from Last 3 Encounters:  02/28/19 (!) 131/55  02/09/19 136/90  11/30/18 127/67   Pulse Readings from Last 3 Encounters:  02/28/19 71  02/09/19 65  11/30/18 (!) 115   BMI Readings from Last 3 Encounters:  02/28/19 19.83 kg/m  02/09/19 18.91 kg/m  11/30/18 19.74 kg/m   Depression screen [facility] 2/9 02/28/2019  11/30/2018 11/08/2018  Decreased Interest 0 0 0  Down, Depressed, Hopeless 0 0 0  PHQ - 2 Score 0 0 0  Altered sleeping 3 0 2  Tired, decreased energy 1 0 0  Change in appetite 2 0 0  Feeling bad or failure about yourself  0 0 0  Trouble concentrating 0 0 0  Moving slowly or fidgety/restless 0 0 0  Suicidal thoughts 0 0 0  PHQ-9 Score 6 0 2  Difficult doing work/chores Not difficult at all - -  Some recent data might be hidden     Patient Care Team    Relationship Specialty Notifications Start End  Mellody Dance, DO PCP - General Family Medicine  10/08/15   Belva Crome, MD PCP - Cardiology Cardiology Admissions 07/18/17   Rexene Alberts, MD  Cardiothoracic Surgery  03/05/11   Belva Crome, MD  Cardiology  03/05/11   Collene Gobble, MD Consulting Physician Pulmonary Disease  11/05/15   Magdalen Spatz, NP Nurse Practitioner Pulmonary Disease  11/05/15      Patient Active Problem List   Diagnosis Date Noted  . History of smoking   ( 43 yrs at 0.5ppd) ;1991- until 2012 ( 8 cig/day )   02/09/2017    Priority: High  . S/P aortic valve replacement with stentless valve 03/18/2011    Priority: High  . Carotid artery disease ([place])     Priority: High  . Hypertension     Priority: High  . COPD (chronic obstructive pulmonary disease) with emphysema ([place]) 03/02/2011    Priority: High  . Osteopenia after menopause 07/07/2017    Priority: Medium  . Osteopenia 11/05/2015    Priority: Medium  . h/o Intention tremor  11/05/2015    Priority: Medium  . S/P aortic valve replacement and aortoplasty 04/13/2011    Priority: Low  . New daily persistent headache-  now resolved 02/28/2019  . Stage 3b chronic kidney disease 02/28/2019  . Prediabetes 02/28/2019  . History of iron deficiency anemia 02/28/2019  . Urinary frequency 02/28/2019  . Dysthymia 02/28/2019  .  Unintentional weight loss 02/09/2019  . Chronic respiratory failure with hypoxia ([place]) 02/09/2019  . Insomnia 04/28/2018   . Iron deficiency anemia 04/28/2018  . Estrogen deficiency 04/28/2018  . H. pylori infection w/ GERD sx 10/19/2017  . Essential hypertension 10/19/2017  . Medication monitoring encounter 07/07/2017  . Family history of breast cancer in sister 07/07/2017  . Family history of bladder cancer-in mother 07/07/2017  . Elevated hemoglobin A1c 11/13/2015  . Hematuria 11/05/2015  . Hypothyroidism; on synthroid 11/05/2015  . h/o Hypercalcemia 11/05/2015  . h/o RLS (restless legs syndrome) 11/05/2015  . History of nephrolithiasis 11/05/2015  . h/o Atrial fibrillation ([place]) 03/22/2011    Past Medical history, Surgical history, Family history, Social history, Allergies and Medications have been entered into the medical record, reviewed and changed as needed.    Current Meds  Medication Sig  . albuterol (PROAIR HFA) 108 (90 Base) MCG/ACT inhaler Inhale 2 puffs into the lungs every 6 (six) hours as needed for wheezing or shortness of breath.  Marland Kitchen amLODipine (NORVASC) 5 MG tablet Take 1/2 (one-half) tablet by mouth once daily  . aspirin 81 MG tablet Take 81 mg by mouth daily.  Marland Kitchen buPROPion (WELLBUTRIN) 75 MG tablet 1/2 tablet twice daily for 2 weeks then 1 full tablet twice daily  . Calcium Carb-Cholecalciferol (CALCIUM-VITAMIN D) 600-400 MG-UNIT TABS Take 1 tablet by mouth 2 (two) times daily.  . Cholecalciferol (VITAMIN D3) 5000 units CAPS Take 1 capsule by mouth daily.  Marland Kitchen levothyroxine (SYNTHROID) 112 MCG tablet Take 1 tablet by mouth once daily  . Melatonin 10 MG TABS Take by mouth.  . Multiple Vitamins-Minerals (MULTIVITAMIN WITH MINERALS) tablet Take 1 tablet by mouth daily.  . predniSONE (DELTASONE) 5 MG tablet Take 1 tablet by mouth once daily with breakfast  . simvastatin (ZOCOR) 20 MG tablet TAKE 1 TABLET BY MOUTH AT BEDTIME  . STIOLTO RESPIMAT 2.5-2.5 MCG/ACT AERS INHALE 2 PUFFS BY MOUTH ONCE DAILY. PLEASE SEE DR FOR REFILLS  . [DISCONTINUED] buPROPion (WELLBUTRIN) 75 MG tablet 1/2 tablet  twice daily for 2 weeks then 1 full tablet twice daily    Allergies:  Allergies  Allergen Reactions  . Clarithromycin     Mouth/throat sore  . Tamsulosin Itching  . Trazodone And Nefazodone     Dizziness   . Penicillins Itching and Rash     Review of Systems:  A fourteen system review of systems was performed and found to be positive as per HPI.   Objective:   Blood pressure (!) 131/55, pulse 71, resp. rate 16, height _0  (1.626 m), weight 115 lb 8 oz (52.4 kg), SpO2 100 %. Body mass index is 19.83 kg/m. General:  Well Developed, well nourished, appropriate for stated age.  Neuro:  Alert and oriented,  extra-ocular muscles intact  HEENT:  Normocephalic, atraumatic, neck supple, no carotid bruits appreciated  Skin:  no gross rash, warm, pink. Cardiac:  RRR, S1 S2 Respiratory:  Globally decreased breath sounds due to decreased aeration/COPD.  No rales or rhonchi appreciated.  not using accessory muscles, speaking in full sentences-essentially unlabored. Vascular:  Ext warm, no cyanosis apprec.; cap RF less 2 sec. Psych:  No HI/SI, judgement and insight good, Euthymic mood. Full Affect.

## 2019-03-01 ENCOUNTER — Telehealth: Payer: Self-pay | Admitting: Interventional Cardiology

## 2019-03-01 DIAGNOSIS — I6523 Occlusion and stenosis of bilateral carotid arteries: Secondary | ICD-10-CM

## 2019-03-01 NOTE — Telephone Encounter (Signed)
Pt seen by Dr. Raliegh Scarlet yesterday and mentioned carotid US.  Ok to order?

## 2019-03-01 NOTE — Telephone Encounter (Signed)
New message     Patient states that Dr Raliegh Scarlet want her to have a carotid ultrasound.  However, she want Dr Tamala Julian to order the test.  Pt is calling to see if Dr Tamala Julian will order carotid ultrasound.

## 2019-03-02 ENCOUNTER — Ambulatory Visit (INDEPENDENT_AMBULATORY_CARE_PROVIDER_SITE_OTHER): Payer: Medicare Other

## 2019-03-02 DIAGNOSIS — J449 Chronic obstructive pulmonary disease, unspecified: Secondary | ICD-10-CM | POA: Diagnosis not present

## 2019-03-02 DIAGNOSIS — J438 Other emphysema: Secondary | ICD-10-CM | POA: Diagnosis not present

## 2019-03-02 NOTE — Telephone Encounter (Signed)
Yes

## 2019-03-02 NOTE — Telephone Encounter (Signed)
Placed order for carotid doppler.  Left message for pt to call back.

## 2019-03-03 ENCOUNTER — Other Ambulatory Visit: Payer: Medicare Other

## 2019-03-03 NOTE — Telephone Encounter (Signed)
Pt called back and was scheduled for Carotid.

## 2019-03-06 ENCOUNTER — Other Ambulatory Visit: Payer: Medicare Other

## 2019-03-06 ENCOUNTER — Other Ambulatory Visit: Payer: Self-pay

## 2019-03-06 DIAGNOSIS — N1832 Chronic kidney disease, stage 3b: Secondary | ICD-10-CM | POA: Diagnosis not present

## 2019-03-06 DIAGNOSIS — E785 Hyperlipidemia, unspecified: Secondary | ICD-10-CM

## 2019-03-06 DIAGNOSIS — E559 Vitamin D deficiency, unspecified: Secondary | ICD-10-CM | POA: Diagnosis not present

## 2019-03-06 DIAGNOSIS — G2581 Restless legs syndrome: Secondary | ICD-10-CM | POA: Diagnosis not present

## 2019-03-06 DIAGNOSIS — Z862 Personal history of diseases of the blood and blood-forming organs and certain disorders involving the immune mechanism: Secondary | ICD-10-CM

## 2019-03-06 DIAGNOSIS — F341 Dysthymic disorder: Secondary | ICD-10-CM

## 2019-03-06 DIAGNOSIS — R350 Frequency of micturition: Secondary | ICD-10-CM

## 2019-03-07 LAB — COMPREHENSIVE METABOLIC PANEL
ALT: 16 IU/L (ref 0–32)
AST: 19 IU/L (ref 0–40)
Albumin/Globulin Ratio: 1.2 (ref 1.2–2.2)
Albumin: 3.8 g/dL (ref 3.6–4.6)
Alkaline Phosphatase: 57 IU/L (ref 39–117)
BUN/Creatinine Ratio: 18 (ref 12–28)
BUN: 30 mg/dL — ABNORMAL HIGH (ref 8–27)
Bilirubin Total: 0.4 mg/dL (ref 0.0–1.2)
CO2: 31 mmol/L — ABNORMAL HIGH (ref 20–29)
Calcium: 11.3 mg/dL — ABNORMAL HIGH (ref 8.7–10.3)
Chloride: 99 mmol/L (ref 96–106)
Creatinine, Ser: 1.69 mg/dL — ABNORMAL HIGH (ref 0.57–1.00)
GFR calc Af Amer: 31 mL/min/{1.73_m2} — ABNORMAL LOW (ref >59)
GFR calc non Af Amer: 27 mL/min/{1.73_m2} — ABNORMAL LOW (ref >59)
Globulin, Total: 3.1 g/dL (ref 1.5–4.5)
Glucose: 105 mg/dL — ABNORMAL HIGH (ref 65–99)
Potassium: 3.9 mmol/L (ref 3.5–5.2)
Sodium: 142 mmol/L (ref 134–144)
Total Protein: 6.9 g/dL (ref 6.0–8.5)

## 2019-03-07 LAB — RETICULOCYTES: Retic Ct Pct: 1.9 % (ref 0.6–2.6)

## 2019-03-07 LAB — HEMOGLOBIN A1C
Est. average glucose Bld gHb Est-mCnc: 111 mg/dL
Hgb A1c MFr Bld: 5.5 % (ref 4.8–5.6)

## 2019-03-07 LAB — TSH: TSH: 1.21 u[IU]/mL (ref 0.450–4.500)

## 2019-03-07 LAB — CBC WITH DIFFERENTIAL/PLATELET
Basophils Absolute: 0 10*3/uL (ref 0.0–0.2)
Basos: 0 %
EOS (ABSOLUTE): 0.3 10*3/uL (ref 0.0–0.4)
Eos: 3 %
Hematocrit: 42.4 % (ref 34.0–46.6)
Hemoglobin: 14.5 g/dL (ref 11.1–15.9)
Immature Grans (Abs): 0 10*3/uL (ref 0.0–0.1)
Immature Granulocytes: 0 %
Lymphocytes Absolute: 1.9 10*3/uL (ref 0.7–3.1)
Lymphs: 18 %
MCH: 32.2 pg (ref 26.6–33.0)
MCHC: 34.2 g/dL (ref 31.5–35.7)
MCV: 94 fL (ref 79–97)
Monocytes Absolute: 0.9 10*3/uL (ref 0.1–0.9)
Monocytes: 9 %
Neutrophils Absolute: 7.6 10*3/uL — ABNORMAL HIGH (ref 1.4–7.0)
Neutrophils: 70 %
Platelets: 195 10*3/uL (ref 150–450)
RBC: 4.5 x10E6/uL (ref 3.77–5.28)
RDW: 12.3 % (ref 11.7–15.4)
WBC: 10.8 10*3/uL (ref 3.4–10.8)

## 2019-03-07 LAB — B12 AND FOLATE PANEL
Folate: >20 ng/mL (ref >3.0)
Vitamin B-12: 1095 pg/mL (ref 232–1245)

## 2019-03-07 LAB — LIPID PANEL
Chol/HDL Ratio: 1.9 ratio (ref 0.0–4.4)
Cholesterol, Total: 151 mg/dL (ref 100–199)
HDL: 81 mg/dL (ref >39)
LDL Chol Calc (NIH): 54 mg/dL (ref 0–99)
Triglycerides: 85 mg/dL (ref 0–149)
VLDL Cholesterol Cal: 16 mg/dL (ref 5–40)

## 2019-03-07 LAB — PHOSPHORUS: Phosphorus: 3.3 mg/dL (ref 3.0–4.3)

## 2019-03-07 LAB — MAGNESIUM: Magnesium: 2.4 mg/dL — ABNORMAL HIGH (ref 1.6–2.3)

## 2019-03-07 LAB — TRANSFERRIN: Transferrin: 211 mg/dL (ref 149–313)

## 2019-03-07 LAB — IRON AND TIBC
Iron Saturation: 41 % (ref 15–55)
Iron: 102 ug/dL (ref 27–139)
Total Iron Binding Capacity: 246 ug/dL — ABNORMAL LOW (ref 250–450)
UIBC: 144 ug/dL (ref 118–369)

## 2019-03-07 LAB — FERRITIN: Ferritin: 277 ng/mL — ABNORMAL HIGH (ref 15–150)

## 2019-03-07 LAB — VITAMIN D 25 HYDROXY (VIT D DEFICIENCY, FRACTURES): Vit D, 25-Hydroxy: 112 ng/mL — ABNORMAL HIGH (ref 30.0–100.0)

## 2019-03-07 LAB — T3: T3, Total: 103 ng/dL (ref 71–180)

## 2019-03-07 LAB — T4, FREE: Free T4: 1.68 ng/dL (ref 0.82–1.77)

## 2019-03-08 ENCOUNTER — Other Ambulatory Visit: Payer: Self-pay

## 2019-03-08 DIAGNOSIS — Z862 Personal history of diseases of the blood and blood-forming organs and certain disorders involving the immune mechanism: Secondary | ICD-10-CM

## 2019-03-08 DIAGNOSIS — R7989 Other specified abnormal findings of blood chemistry: Secondary | ICD-10-CM

## 2019-03-08 DIAGNOSIS — N289 Disorder of kidney and ureter, unspecified: Secondary | ICD-10-CM

## 2019-03-08 DIAGNOSIS — R630 Anorexia: Secondary | ICD-10-CM

## 2019-03-08 DIAGNOSIS — Z1231 Encounter for screening mammogram for malignant neoplasm of breast: Secondary | ICD-10-CM

## 2019-03-08 NOTE — Progress Notes (Signed)
Upon review of medications with patient, she was taking OTC Vitamin D3 5,000IU daily, Ferrous Sulfate 332m 3 times weekly, and Calcium + Vitamin D 600/400 one tablet twice daily.  During home visit, I personally removed these medications from the patient's pill boxes and had pt place these medications away from her daily medications as Dr. ORaliegh Scarletadvised that she stop all calcium, vitamin D and iron supplements d/t elevated levels of these minerals.  Pt expressed understanding and is agreeable.  These OTC medications were discontinued from the pt's medication list. TCharyl Bigger CMA

## 2019-03-09 ENCOUNTER — Other Ambulatory Visit: Payer: Self-pay

## 2019-03-09 ENCOUNTER — Ambulatory Visit (HOSPITAL_COMMUNITY)
Admission: RE | Admit: 2019-03-09 | Discharge: 2019-03-09 | Disposition: A | Discharge status: Home / Self Care | Payer: Medicare Other | Source: Ambulatory Visit | Attending: Cardiology | Admitting: Cardiology

## 2019-03-09 DIAGNOSIS — I6523 Occlusion and stenosis of bilateral carotid arteries: Secondary | ICD-10-CM | POA: Insufficient documentation

## 2019-03-19 DIAGNOSIS — J438 Other emphysema: Secondary | ICD-10-CM | POA: Diagnosis not present

## 2019-03-19 DIAGNOSIS — J449 Chronic obstructive pulmonary disease, unspecified: Secondary | ICD-10-CM | POA: Diagnosis not present

## 2019-03-21 ENCOUNTER — Telehealth: Payer: Self-pay | Admitting: Hematology

## 2019-03-21 NOTE — Telephone Encounter (Signed)
Confirmed 11/19 new patient appointment with patient. Date per patient needs Thursday.

## 2019-04-06 ENCOUNTER — Inpatient Hospital Stay: Payer: Medicare Other | Attending: Hematology | Admitting: Hematology

## 2019-04-06 ENCOUNTER — Inpatient Hospital Stay: Payer: Medicare Other

## 2019-04-06 ENCOUNTER — Other Ambulatory Visit: Payer: Self-pay

## 2019-04-06 DIAGNOSIS — Z808 Family history of malignant neoplasm of other organs or systems: Secondary | ICD-10-CM

## 2019-04-06 DIAGNOSIS — Z87891 Personal history of nicotine dependence: Secondary | ICD-10-CM | POA: Diagnosis not present

## 2019-04-06 DIAGNOSIS — Z803 Family history of malignant neoplasm of breast: Secondary | ICD-10-CM | POA: Insufficient documentation

## 2019-04-06 DIAGNOSIS — Z8349 Family history of other endocrine, nutritional and metabolic diseases: Secondary | ICD-10-CM | POA: Insufficient documentation

## 2019-04-06 DIAGNOSIS — Z9981 Dependence on supplemental oxygen: Secondary | ICD-10-CM | POA: Diagnosis not present

## 2019-04-06 DIAGNOSIS — Z801 Family history of malignant neoplasm of trachea, bronchus and lung: Secondary | ICD-10-CM | POA: Diagnosis not present

## 2019-04-06 DIAGNOSIS — N289 Disorder of kidney and ureter, unspecified: Secondary | ICD-10-CM | POA: Diagnosis not present

## 2019-04-06 DIAGNOSIS — E039 Hypothyroidism, unspecified: Secondary | ICD-10-CM | POA: Diagnosis not present

## 2019-04-06 DIAGNOSIS — J449 Chronic obstructive pulmonary disease, unspecified: Secondary | ICD-10-CM | POA: Diagnosis not present

## 2019-04-06 DIAGNOSIS — Z8052 Family history of malignant neoplasm of bladder: Secondary | ICD-10-CM

## 2019-04-06 LAB — CBC WITH DIFFERENTIAL/PLATELET
Abs Immature Granulocytes: 0.03 10*3/uL (ref 0.00–0.07)
Basophils Absolute: 0 10*3/uL (ref 0.0–0.1)
Basophils Relative: 0 %
Eosinophils Absolute: 0.1 10*3/uL (ref 0.0–0.5)
Eosinophils Relative: 1 %
HCT: 42.9 % (ref 36.0–46.0)
Hemoglobin: 13.7 g/dL (ref 12.0–15.0)
Immature Granulocytes: 0 %
Lymphocytes Relative: 12 %
Lymphs Abs: 1 10*3/uL (ref 0.7–4.0)
MCH: 32.6 pg (ref 26.0–34.0)
MCHC: 31.9 g/dL (ref 30.0–36.0)
MCV: 102.1 fL — ABNORMAL HIGH (ref 80.0–100.0)
Monocytes Absolute: 0.5 10*3/uL (ref 0.1–1.0)
Monocytes Relative: 6 %
Neutro Abs: 7.1 10*3/uL (ref 1.7–7.7)
Neutrophils Relative %: 81 %
Platelets: 185 10*3/uL (ref 150–400)
RBC: 4.2 MIL/uL (ref 3.87–5.11)
RDW: 13.2 % (ref 11.5–15.5)
WBC: 8.8 10*3/uL (ref 4.0–10.5)
nRBC: 0 % (ref 0.0–0.2)

## 2019-04-06 LAB — CMP (CANCER CENTER ONLY)
ALT: 20 U/L (ref 0–44)
AST: 20 U/L (ref 15–41)
Albumin: 3.6 g/dL (ref 3.5–5.0)
Alkaline Phosphatase: 54 U/L (ref 38–126)
Anion gap: 10 (ref 5–15)
BUN: 26 mg/dL — ABNORMAL HIGH (ref 8–23)
CO2: 29 mmol/L (ref 22–32)
Calcium: 9.3 mg/dL (ref 8.9–10.3)
Chloride: 102 mmol/L (ref 98–111)
Creatinine: 1.82 mg/dL — ABNORMAL HIGH (ref 0.44–1.00)
GFR, Est AFR Am: 29 mL/min — ABNORMAL LOW (ref >60)
GFR, Estimated: 25 mL/min — ABNORMAL LOW (ref >60)
Glucose, Bld: 115 mg/dL — ABNORMAL HIGH (ref 70–99)
Potassium: 5 mmol/L (ref 3.5–5.1)
Sodium: 141 mmol/L (ref 135–145)
Total Bilirubin: 0.3 mg/dL (ref 0.3–1.2)
Total Protein: 7.3 g/dL (ref 6.5–8.1)

## 2019-04-06 LAB — VITAMIN D 25 HYDROXY (VIT D DEFICIENCY, FRACTURES): Vit D, 25-Hydroxy: 76.4 ng/mL (ref 30–100)

## 2019-04-06 LAB — LACTATE DEHYDROGENASE: LDH: 183 U/L (ref 98–192)

## 2019-04-06 LAB — SEDIMENTATION RATE: Sed Rate: 13 mm/hr (ref 0–22)

## 2019-04-06 NOTE — Progress Notes (Signed)
HEMATOLOGY/ONCOLOGY CONSULTATION NOTE  Date of Service: 04/06/2019  Patient Care Team: Mellody Dance, DO as PCP - General (Family Medicine) Belva Crome, MD as PCP - Cardiology (Cardiology) Rexene Alberts, MD (Cardiothoracic Surgery) Belva Crome, MD (Cardiology) Collene Gobble, MD as Consulting Physician (Pulmonary Disease) Magdalen Spatz, NP as Nurse Practitioner (Pulmonary Disease)  REFERRING PHYSICIAN: Mellody Dance, DO  CHIEF COMPLAINTS/PURPOSE OF CONSULTATION:  Multiple myeloma, abs labs, worsening renal inefficiency    HISTORY OF PRESENTING ILLNESS:   [name] is a wonderful 85 y.o. female who has been referred to [place] by Mellody Dance, DO for evaluation and management of Multiple myeloma, abs labs, worsening renal inefficiency . She is accompanied today by her son. The pt reports that she is doing well overall.   The pt reports she noticed she lost 14lbs in a year. Recently gained 4lbs.  No leg swelling.  She is not on diuretic   She has been on oxygen a little over 3 years due to COPD. Previous smoker. She stopped smoking in 2012.  Pt states she went to PCP for annual check up and calcium levels were elevated  She was taking vitamin d supplements but was advised to stop taking them by her PCP  She was taking Iron for restless leg syndrome  She is schedule to see a urologist    She drinks about 77oz of water a day.  She has had high calcium and low potassium previously. She was admitted into he hospital   for 4 days.  Having to go to the rest during the night interferes with her sleep  She has been having thyroid replacement for about 40 years for underactive thyroid. Her son and grandaugter have underactive thyroids as well.  Most recent lab results (03/06/2019) of CBC is as follows: all values are WNL except for Neutrophils Absolute at 7.6, Total Iron Binding Capacity at 246, Ferritin at 227, Vitamin D at 112, Magnesium at 2.4,.  On  review of systems, pt reports  and denies leg swelling, new bone pains, lumps or bumps, back pain, abdominal pain, leg swelling and any other symptoms.   MEDICAL HISTORY:  Past Medical History:  Diagnosis Date   Allergic rhinitis    Aortic stenosis    Carotid artery disease ([place])    Colon polyps    h/o Atrial fibrillation ([place]) 03/22/2011   Brief post op    Hematuria 11/05/2015   Hypercalcemia    Hypertension    Hypothyroid    Osteopenia    Oxygen dependent 11/05/2015   Pressure urticaria    RLS (restless legs syndrome)    S/P aortic valve replacement and aortoplasty 04/13/2011   4m Medtronic Freestyle porcine aortic root    S/P aortic valve replacement with stentless valve 03/18/2011     SURGICAL HISTORY: Past Surgical History:  Procedure Laterality Date   AORTIC ROOT REPLACEMENT  03/18/2011   242mMedtronic Freestyle Porcine aortic root with reimplantation of coronary arteries   BREAST LUMPECTOMY     Left   CATARACT EXTRACTION     CHOLECYSTECTOMY     FOOT SURGERY     Morton's neuroma   KNEE ARTHROSCOPY     Left     SOCIAL HISTORY: Social History   Socioeconomic History   Marital status: Widowed    Spouse name: Not on file   Number of children: N   Years of education: Not on file   Highest education level: Not on file  Occupational History   Occupation: housewife  Scientist, product/process development strain: Not on file   Food insecurity    Worry: Not on file    Inability: Not on file   Transportation needs    Medical: Not on file    Non-medical: Not on file  Tobacco Use   Smoking status: Former Smoker    Packs/day: 0.90    Years: 62.00    Pack years: 55.80    Types: Cigarettes    Quit date: 07/14/2010    Years since quitting: 8.7   Smokeless tobacco: Never Used  Substance and Sexual Activity   Alcohol use: No    Alcohol/week: 0.0 standard drinks   Drug use: No   Sexual activity: Never  Lifestyle   Physical  activity    Days per week: Not on file    Minutes per session: Not on file   Stress: Not on file  Relationships   Social connections    Talks on phone: Not on file    Gets together: Not on file    Attends religious service: Not on file    Active member of club or organization: Not on file    Attends meetings of clubs or organizations: Not on file    Relationship status: Not on file   Intimate partner violence    Fear of current or ex partner: Not on file    Emotionally abused: Not on file    Physically abused: Not on file    Forced sexual activity: Not on file  Other Topics Concern   Not on file  Social History Narrative   Not on file     FAMILY HISTORY: Family History  Problem Relation Age of Onset   Cancer Mother        bladder   Lung cancer Father    Heart disease Maternal Grandmother    Emphysema Maternal Aunt    Asthma Maternal Aunt    Asthma Maternal Uncle    Breast cancer Sister    Uterine cancer Sister      ALLERGIES:   is allergic to clarithromycin; tamsulosin; trazodone and nefazodone; and penicillins.   MEDICATIONS:  Current Outpatient Medications  Medication Sig Dispense Refill   albuterol (PROAIR HFA) 108 (90 Base) MCG/ACT inhaler Inhale 2 puffs into the lungs every 6 (six) hours as needed for wheezing or shortness of breath. 1 Inhaler 3   amLODipine (NORVASC) 5 MG tablet Take 1/2 (one-half) tablet by mouth once daily 45 tablet 0   aspirin 81 MG tablet Take 81 mg by mouth daily.     buPROPion (WELLBUTRIN) 75 MG tablet 1/2 tablet twice daily for 2 weeks then 1 full tablet twice daily 180 tablet 1   levothyroxine (SYNTHROID) 112 MCG tablet Take 1 tablet by mouth once daily 90 tablet 0   Melatonin 10 MG TABS Take by mouth.     Multiple Vitamins-Minerals (MULTIVITAMIN WITH MINERALS) tablet Take 1 tablet by mouth daily.     predniSONE (DELTASONE) 5 MG tablet Take 1 tablet by mouth once daily with breakfast 90 tablet 0   simvastatin  (ZOCOR) 20 MG tablet TAKE 1 TABLET BY MOUTH AT BEDTIME 90 tablet 0   STIOLTO RESPIMAT 2.5-2.5 MCG/ACT AERS INHALE 2 PUFFS BY MOUTH ONCE DAILY. PLEASE SEE DR FOR REFILLS 4 g 5   No current facility-administered medications for this visit.      REVIEW OF SYSTEMS:   A 10+ POINT REVIEW OF SYSTEMS WAS OBTAINED including neurology,  dermatology, psychiatry, cardiac, respiratory, lymph, extremities, GI, GU, Musculoskeletal, constitutional, breasts, reproductive, HEENT.  All pertinent positives are noted in the HPI.  All others are negative.    PHYSICAL EXAMINATION: ECOG PERFORMANCE STATUS: 2 - Symptomatic, <50% confined to bed  Vitals:   04/06/19 1326  BP: (!) 135/53  Pulse: 79  Resp: 18  Temp: 98.9 F (37.2 C)  SpO2: 96%   Filed Weights   04/06/19 1326  Weight: 115 lb 4.8 oz (52.3 kg)   Body mass index is 19.79 kg/m.  GENERAL:alert, in no acute distress and comfortable SKIN: no acute rashes, no significant lesions EYES: conjunctiva are pink and non-injected, sclera anicteric OROPHARYNX: MMM, no exudates, no oropharyngeal erythema or ulceration NECK: supple, no JVD LYMPH:  no palpable lymphadenopathy in the cervical, axillary or inguinal regions LUNGS: clear to auscultation b/l with normal respiratory effort HEART: regular rate & rhythm ABDOMEN:  normoactive bowel sounds , non tender, not distended. Extremity: no pedal edema PSYCH: alert & oriented x 3 with fluent speech NEURO: no focal motor/sensory deficits   LABORATORY DATA:  I have reviewed the data as listed  CBC Latest Ref Rng & Units 03/06/2019 04/28/2018 07/12/2017  WBC 3.4 - 10.8 x10E3/uL 10.8 5.1 7.0  Hemoglobin 11.1 - 15.9 g/dL 14.5 14.7 14.0  Hematocrit 34.0 - 46.6 % 42.4 44.1 43.1  Platelets 150 - 450 x10E3/uL 195 165 153    CMP Latest Ref Rng & Units 03/06/2019 11/07/2018 04/28/2018  Glucose 65 - 99 mg/dL 105(H) 108(H) 83  BUN 8 - 27 mg/dL 30(H) 21 22  Creatinine 0.57 - 1.00 mg/dL 1.69(H) 1.50(H) 1.38(H)    Sodium 134 - 144 mmol/L 142 145(H) 146(H)  Potassium 3.5 - 5.2 mmol/L 3.9 4.2 3.8  Chloride 96 - 106 mmol/L 99 98 99  CO2 20 - 29 mmol/L 31(H) 31(H) 31(H)  Calcium 8.7 - 10.3 mg/dL 11.3(H) 12.0(H) 10.9(H)  Total Protein 6.0 - 8.5 g/dL 6.9 6.9 6.7  Total Bilirubin 0.0 - 1.2 mg/dL 0.4 0.4 0.5  Alkaline Phos 39 - 117 IU/L 57 55 45  AST 0 - 40 IU/L _0 ALT 0 - 32 IU/L _1 RADIOGRAPHIC STUDIES: I have personally reviewed the radiological images as listed and agreed with the findings in the report. Vas US Carotid  Result Date: 03/09/2019 Carotid Arterial Duplex Study Indications:       Carotid artery disease and atherosclerosis f/u. Patient                    denies any cerebrovascular symptoms. Risk Factors:      Hypertension, hyperlipidemia, past history of smoking. Comparison Study:  In 11/18, a carotid duplex showed RICA velocities of 137/26                    cm/sec and LICA velocities of 161/09 cm/sec. Performing Technologist: Mariane Masters RVT  Examination Guidelines: A complete evaluation includes B-mode imaging, spectral Doppler, color Doppler, and power Doppler as needed of all accessible portions of each vessel. Bilateral testing is considered an integral part of a complete examination. Limited examinations for reoccurring indications may be performed as noted.  Right Carotid Findings: +----------+--------+--------+--------+-------------------------+--------+             PSV cm/s EDV cm/s Stenosis Plaque Description        Comments  +----------+--------+--------+--------+-------------------------+--------+  CCA Prox   109      8                                                     +----------+--------+--------+--------+-------------------------+--------+  CCA Mid    83       10       <50%     hyperechoic                         +----------+--------+--------+--------+-------------------------+--------+  CCA Distal 79       8                                                      +----------+--------+--------+--------+-------------------------+--------+  ICA Prox   135      21       1-39%    irregular and hyperechoic           +----------+--------+--------+--------+-------------------------+--------+  ICA Mid    90       17                                                    +----------+--------+--------+--------+-------------------------+--------+  ICA Distal 101      20                                                    +----------+--------+--------+--------+-------------------------+--------+  ECA        160      8                 heterogenous                        +----------+--------+--------+--------+-------------------------+--------+ +----------+--------+-------+----------------+-------------------+             PSV cm/s EDV cms Describe         Arm Pressure (mmHG)  +----------+--------+-------+----------------+-------------------+  Subclavian 220              Multiphasic, WNL 120                  +----------+--------+-------+----------------+-------------------+ +---------+--------+--+--------+--+---------+  Vertebral PSV cm/s 59 EDV cm/s 10 Antegrade  +---------+--------+--+--------+--+---------+  Left Carotid Findings: +----------+--------+--------+--------+------------------+--------+             PSV cm/s EDV cm/s Stenosis Plaque Description Comments  +----------+--------+--------+--------+------------------+--------+  CCA Prox   74       12                                             +----------+--------+--------+--------+------------------+--------+  CCA Mid    92       16       <50%     heterogenous                 +----------+--------+--------+--------+------------------+--------+  CCA Distal 114      14                                             +----------+--------+--------+--------+------------------+--------+  ICA Prox   57       12                heterogenous                 +----------+--------+--------+--------+------------------+--------+  ICA Mid    140      30        1-39%                                 +----------+--------+--------+--------+------------------+--------+  ICA Distal 86       17                                             +----------+--------+--------+--------+------------------+--------+  ECA        377      2        >50%                                  +----------+--------+--------+--------+------------------+--------+ +----------+--------+--------+----------------+-------------------+             PSV cm/s EDV cm/s Describe         Arm Pressure (mmHG)  +----------+--------+--------+----------------+-------------------+  Subclavian 327               Multiphasic, WNL 122                  +----------+--------+--------+----------------+-------------------+ +---------+--------+--+--------+--+---------+  Vertebral PSV cm/s 55 EDV cm/s 11 Antegrade  +---------+--------+--+--------+--+---------+  Summary: Right Carotid: Velocities in the right ICA are consistent with a 1-39% stenosis.                Non-hemodynamically significant plaque <50% noted in the CCA. Left Carotid: Velocities in the left ICA are consistent with a 1-39% stenosis.               Non-hemodynamically significant plaque <50% noted in the CCA. The               ECA appears >50% stenosed. Vertebrals:  Bilateral vertebral arteries demonstrate antegrade flow. Subclavians: Left subclavian artery was stenotic. Normal flow hemodynamics were              seen in the right subclavian artery. *See table(s) above for measurements and observations.  Electronically signed by Larae Grooms MD on 03/09/2019 at 6:20:51 PM.    Final      ASSESSMENT & PLAN:  [name] is a 85 y.o. female with:  1. R/o Monoclonal paraproteinemia/Plasma cell dyscrasia. Patient with renal insufficiency and hypercalcemia   PLAN: -Discussed patient's most recent labs from 03/06/2019, CBC is as follows: all values are WNL except for Neutrophils Absolute at 7.6, Total Iron Binding Capacity at 246, Ferritin at 227,  Vitamin D at 112, Magnesium at 2.4, -Discussed kidney number started getting worse starting in 04/2019.  -Discussed that pts calcium levels were high- workup ordered -Discussed blood test to check for bone marrow issues -Labs today and schedule bone scans after Thanksgiving   FOLLOW UP Labs today Bone scan in 7-10 days Phone with Dr Irene Limbo in 2 weeks  . Orders Placed This Encounter  Procedures   DG Bone Survey Met    Standing Status:   Future    Standing Expiration Date:  06/05/2020    Order Specific Question:   Reason for Exam (SYMPTOM  OR DIAGNOSIS REQUIRED)    Answer:   hypercalcemia r/o bone lesions    Order Specific Question:   Preferred imaging location?    Answer:   [facility]   CBC with Differential/Platelet    Standing Status:   Future    Number of Occurrences:   1    Standing Expiration Date:   05/10/2020   CMP (Cancer Center only)    Standing Status:   Future    Number of Occurrences:   1    Standing Expiration Date:   04/05/2020   Multiple Myeloma Panel (SPEP&IFE w/QIG)    Standing Status:   Future    Number of Occurrences:   1    Standing Expiration Date:   04/05/2020   Kappa/lambda light chains    Standing Status:   Future    Number of Occurrences:   1    Standing Expiration Date:   05/10/2020   Sedimentation rate    Standing Status:   Future    Number of Occurrences:   1    Standing Expiration Date:   04/05/2020   Calcitriol (1,25 di-OH Vit D)    Standing Status:   Future    Number of Occurrences:   1    Standing Expiration Date:   04/05/2020   Vitamin D 25 hydroxy    Standing Status:   Future    Number of Occurrences:   1    Standing Expiration Date:   04/05/2020   PTH related peptide    Standing Status:   Future    Number of Occurrences:   1    Standing Expiration Date:   04/05/2020   PTH, intact and calcium    Standing Status:   Future    Number of Occurrences:   1    Standing Expiration Date:   04/05/2020   Lactate  dehydrogenase    Standing Status:   Future    Number of Occurrences:   1    Standing Expiration Date:   04/05/2020    All of the patients questions were answered with apparent satisfaction. The patient knows to call the clinic with any problems, questions or concerns.  I spent 30 minutes counseling the patient face to face. The total time spent in the appointment was 45 minutes and more than 50% was on counseling and direct patient cares.    Sullivan Lone MD MS AAHIVMS [facility] Hematology/Oncology Physician [facility]  (Office):       [phone] (Work cell):  [phone] (Fax):           [phone]  04/06/2019 6:02 AM  I, Scot Dock, am acting as a scribe for Dr. Sullivan Lone.   .I have reviewed the above documentation for accuracy and completeness, and I agree with the above. Brunetta Genera MD

## 2019-04-07 ENCOUNTER — Telehealth: Payer: Self-pay | Admitting: Hematology

## 2019-04-07 LAB — PTH, INTACT AND CALCIUM
Calcium, Total (PTH): 9.4 mg/dL (ref 8.7–10.3)
PTH: 44 pg/mL (ref 15–65)

## 2019-04-07 LAB — CALCITRIOL (1,25 DI-OH VIT D): Vit D, 1,25-Dihydroxy: 35.2 pg/mL (ref 19.9–79.3)

## 2019-04-07 LAB — KAPPA/LAMBDA LIGHT CHAINS
Kappa free light chain: 45 mg/L — ABNORMAL HIGH (ref 3.3–19.4)
Kappa, lambda light chain ratio: 1.94 — ABNORMAL HIGH (ref 0.26–1.65)
Lambda free light chains: 23.2 mg/L (ref 5.7–26.3)

## 2019-04-07 NOTE — Telephone Encounter (Signed)
Scheduled appt per 11/19 los.  Spoke with pt and they are aware of the appt date and time.

## 2019-04-09 LAB — PTH-RELATED PEPTIDE: PTH-related peptide: <2 pmol/L

## 2019-04-10 LAB — MULTIPLE MYELOMA PANEL, SERUM
Albumin SerPl Elph-Mcnc: 3.4 g/dL (ref 2.9–4.4)
Albumin/Glob SerPl: 1.1 (ref 0.7–1.7)
Alpha 1: 0.3 g/dL (ref 0.0–0.4)
Alpha2 Glob SerPl Elph-Mcnc: 0.7 g/dL (ref 0.4–1.0)
B-Globulin SerPl Elph-Mcnc: 1 g/dL (ref 0.7–1.3)
Gamma Glob SerPl Elph-Mcnc: 1.2 g/dL (ref 0.4–1.8)
Globulin, Total: 3.2 g/dL (ref 2.2–3.9)
IgA: 316 mg/dL (ref 64–422)
IgG (Immunoglobin G), Serum: 1256 mg/dL (ref 586–1602)
IgM (Immunoglobulin M), Srm: 53 mg/dL (ref 26–217)
Total Protein ELP: 6.6 g/dL (ref 6.0–8.5)

## 2019-04-14 ENCOUNTER — Other Ambulatory Visit: Payer: Self-pay

## 2019-04-14 ENCOUNTER — Ambulatory Visit (HOSPITAL_COMMUNITY)
Admission: RE | Admit: 2019-04-14 | Discharge: 2019-04-14 | Disposition: A | Discharge status: Home / Self Care | Payer: Medicare Other | Source: Ambulatory Visit | Attending: Hematology | Admitting: Hematology

## 2019-04-18 ENCOUNTER — Other Ambulatory Visit: Payer: Self-pay | Admitting: Family Medicine

## 2019-04-18 DIAGNOSIS — J438 Other emphysema: Secondary | ICD-10-CM

## 2019-04-18 DIAGNOSIS — E038 Other specified hypothyroidism: Secondary | ICD-10-CM

## 2019-04-18 DIAGNOSIS — I10 Essential (primary) hypertension: Secondary | ICD-10-CM

## 2019-04-18 DIAGNOSIS — J449 Chronic obstructive pulmonary disease, unspecified: Secondary | ICD-10-CM | POA: Diagnosis not present

## 2019-04-20 DIAGNOSIS — R8271 Bacteriuria: Secondary | ICD-10-CM | POA: Diagnosis not present

## 2019-04-20 DIAGNOSIS — Q6211 Congenital occlusion of ureteropelvic junction: Secondary | ICD-10-CM | POA: Diagnosis not present

## 2019-04-20 DIAGNOSIS — R3129 Other microscopic hematuria: Secondary | ICD-10-CM | POA: Diagnosis not present

## 2019-04-21 ENCOUNTER — Inpatient Hospital Stay: Payer: Medicare Other | Attending: Hematology | Admitting: Hematology

## 2019-04-21 ENCOUNTER — Other Ambulatory Visit: Payer: Self-pay | Admitting: Family Medicine

## 2019-04-21 DIAGNOSIS — I10 Essential (primary) hypertension: Secondary | ICD-10-CM

## 2019-04-21 MED ORDER — AMLODIPINE BESYLATE 5 MG PO TABS: ORAL_TABLET | ORAL | 0 refills | Status: DC

## 2019-04-21 NOTE — Progress Notes (Signed)
HEMATOLOGY/ONCOLOGY CLINIC NOTE  Date of Service: 04/21/2019  Patient Care Team: Mellody Dance, DO as PCP - General (Family Medicine) Belva Crome, MD as PCP - Cardiology (Cardiology) Rexene Alberts, MD (Cardiothoracic Surgery) Belva Crome, MD (Cardiology) Collene Gobble, MD as Consulting Physician (Pulmonary Disease) Magdalen Spatz, NP as Nurse Practitioner (Pulmonary Disease)  REFERRING PHYSICIAN: Mellody Dance, DO  CHIEF COMPLAINTS/PURPOSE OF CONSULTATION:  Multiple myeloma, abs labs, worsening renal inefficiency    HISTORY OF PRESENTING ILLNESS:   [name] is a wonderful 85 y.o. female who has been referred to [place] by Mellody Dance, DO for evaluation and management of Multiple myeloma, abs labs, worsening renal inefficiency . She is accompanied today by her son. The pt reports that she is doing well overall.   The pt reports she noticed she lost 14lbs in a year. Recently gained 4lbs.  No leg swelling.  She is not on diuretic   She has been on oxygen a little over 3 years due to COPD. Previous smoker. She stopped smoking in 2012.  Pt states she went to PCP for annual check up and calcium levels were elevated  She was taking vitamin d supplements but was advised to stop taking them by her PCP  She was taking Iron for restless leg syndrome  She is schedule to see a urologist    She drinks about 77oz of water a day.  She has had high calcium and low potassium previously. She was admitted into he hospital   for 4 days.  Having to go to the rest during the night interferes with her sleep  She has been having thyroid replacement for about 40 years for underactive thyroid. Her son and grandaugter have underactive thyroids as well.  Most recent lab results (03/06/2019) of CBC is as follows: all values are WNL except for Neutrophils Absolute at 7.6, Total Iron Binding Capacity at 246, Ferritin at 227, Vitamin D at 112, Magnesium at 2.4,.  On review of  systems, pt reports  and denies leg swelling, new bone pains, lumps or bumps, back pain, abdominal pain, leg swelling and any other symptoms.   INTERVAL HISTORY:  I connected with [name] on 04/21/19  at 11:20 AM EST by Telehealth and verified that I am speaking with the correct person using two identifiers.   I discussed the limitations, risks, security and privacy concerns of performing an evaluation and management service by telemedicine and the availability of in-person appointments. I also discussed with the patient that there may be a patient responsible charge related to this service. The patient expressed understanding and agreed to proceed.    Patients location: Home Providers location: [place]   Chief Complaint: Multiple myeloma, abs labs, worsening renal inefficiency    [name] is a 85 y.o. female here for evaluation and management of Multiple myeloma, abs labs, worsening renal inefficiency. The patient's last visit with [place] was on 04/06/2019. The pt reports that she is doing well overall.  The pt reports no acute new symptoms.  Of note since the patient's last visit, pt has had DG Bone Survey Met (Accession [id number]) completed on 04/14/2019 with results revealing "Osseous demineralization with scattered degenerative changes as above.Compression fractures of L3 and approximately T6. No lytic or sclerotic bone lesions identified.Aortic Atherosclerosis (ICD10-I70.0) and Emphysema (ICD10-J43.9)."  Lab results today 04/06/2019 of CBC w/diff and CMP is as follows: all values are WNL except for MCV at 102.1, Kappa free light chain  at 45.0, Kappa lamda light chain ratio at  1.94, Glucose Bld at 115, BUN at 26, Creatinine at 1.82, GFR Est Non Af Am at 25, GFR Est AFR AM at 29.   MEDICAL HISTORY:  Past Medical History:  Diagnosis Date   Allergic rhinitis    Aortic stenosis    Carotid artery disease ([place])    Colon polyps    h/o Atrial fibrillation ([place]) 03/22/2011    Brief post op    Hematuria 11/05/2015   Hypercalcemia    Hypertension    Hypothyroid    Osteopenia    Oxygen dependent 11/05/2015   Pressure urticaria    RLS (restless legs syndrome)    S/P aortic valve replacement and aortoplasty 04/13/2011   15m Medtronic Freestyle porcine aortic root    S/P aortic valve replacement with stentless valve 03/18/2011     SURGICAL HISTORY: Past Surgical History:  Procedure Laterality Date   AORTIC ROOT REPLACEMENT  03/18/2011   260mMedtronic Freestyle Porcine aortic root with reimplantation of coronary arteries   BREAST LUMPECTOMY     Left   CATARACT EXTRACTION     CHOLECYSTECTOMY     FOOT SURGERY     Morton's neuroma   KNEE ARTHROSCOPY     Left     SOCIAL HISTORY: Social History   Socioeconomic History   Marital status: Widowed    Spouse name: Not on file   Number of children: N   Years of education: Not on file   Highest education level: Not on file  Occupational History   Occupation: housewife  Social NeDesigner, fashion/clothingtrain: Not on file   Food insecurity    Worry: Not on file    Inability: Not on file   Transportation needs    Medical: Not on file    Non-medical: Not on file  Tobacco Use   Smoking status: Former Smoker    Packs/day: 0.90    Years: 62.00    Pack years: 55.80    Types: Cigarettes    Quit date: 07/14/2010    Years since quitting: 8.7   Smokeless tobacco: Never Used  Substance and Sexual Activity   Alcohol use: No    Alcohol/week: 0.0 standard drinks   Drug use: No   Sexual activity: Never  Lifestyle   Physical activity    Days per week: Not on file    Minutes per session: Not on file   Stress: Not on file  Relationships   Social connections    Talks on phone: Not on file    Gets together: Not on file    Attends religious service: Not on file    Active member of club or organization: Not on file    Attends meetings of clubs or organizations: Not on file      Relationship status: Not on file   Intimate partner violence    Fear of current or ex partner: Not on file    Emotionally abused: Not on file    Physically abused: Not on file    Forced sexual activity: Not on file  Other Topics Concern   Not on file  Social History Narrative   Not on file     FAMILY HISTORY: Family History  Problem Relation Age of Onset   Cancer Mother        bladder   Lung cancer Father    Heart disease Maternal Grandmother    Emphysema Maternal Aunt    Asthma Maternal Aunt  Asthma Maternal Uncle    Breast cancer Sister    Uterine cancer Sister      ALLERGIES:   is allergic to clarithromycin; tamsulosin; trazodone and nefazodone; and penicillins.   MEDICATIONS:  Current Outpatient Medications  Medication Sig Dispense Refill   albuterol (PROAIR HFA) 108 (90 Base) MCG/ACT inhaler Inhale 2 puffs into the lungs every 6 (six) hours as needed for wheezing or shortness of breath. 1 Inhaler 3   amLODipine (NORVASC) 5 MG tablet Take 1/2 (one-half) tablet by mouth once daily 45 tablet 0   aspirin 81 MG tablet Take 81 mg by mouth daily.     buPROPion (WELLBUTRIN) 75 MG tablet 1/2 tablet twice daily for 2 weeks then 1 full tablet twice daily 180 tablet 1   levothyroxine (SYNTHROID) 112 MCG tablet Take 1 tablet by mouth once daily 90 tablet 0   Melatonin 10 MG TABS Take by mouth.     Multiple Vitamins-Minerals (MULTIVITAMIN WITH MINERALS) tablet Take 1 tablet by mouth daily.     predniSONE (DELTASONE) 5 MG tablet Take 1 tablet by mouth once daily with breakfast 90 tablet 0   simvastatin (ZOCOR) 20 MG tablet TAKE 1 TABLET BY MOUTH AT BEDTIME 90 tablet 0   STIOLTO RESPIMAT 2.5-2.5 MCG/ACT AERS INHALE 2 PUFFS BY MOUTH ONCE DAILY. PLEASE SEE DR FOR REFILLS 4 g 5   No current facility-administered medications for this visit.      REVIEW OF SYSTEMS:   A 10+ POINT REVIEW OF SYSTEMS WAS OBTAINED including neurology, dermatology, psychiatry,  cardiac, respiratory, lymph, extremities, GI, GU, Musculoskeletal, constitutional, breasts, reproductive, HEENT.  All pertinent positives are noted in the HPI.  All others are negative.      PHYSICAL EXAMINATION: There were no vitals filed for this visit. Wt Readings from Last 3 Encounters:  04/06/19 115 lb 4.8 oz (52.3 kg)  02/28/19 115 lb 8 oz (52.4 kg)  02/09/19 111 lb (50.3 kg)   There is no height or weight on file to calculate BMI.    Telehealth Visit 04/21/19    LABORATORY DATA:  I have reviewed the data as listed  CBC Latest Ref Rng & Units 04/06/2019 03/06/2019 04/28/2018  WBC 4.0 - 10.5 K/uL 8.8 10.8 5.1  Hemoglobin 12.0 - 15.0 g/dL 13.7 14.5 14.7  Hematocrit 36.0 - 46.0 % 42.9 42.4 44.1  Platelets 150 - 400 K/uL 185 195 165    CMP Latest Ref Rng & Units 04/06/2019 04/06/2019 03/06/2019  Glucose 70 - 99 mg/dL 115(H) - 105(H)  BUN 8 - 23 mg/dL 26(H) - 30(H)  Creatinine 0.44 - 1.00 mg/dL 1.82(H) - 1.69(H)  Sodium 135 - 145 mmol/L 141 - 142  Potassium 3.5 - 5.1 mmol/L 5.0 - 3.9  Chloride 98 - 111 mmol/L 102 - 99  CO2 22 - 32 mmol/L 29 - 31(H)  Calcium 8.7 - 10.3 mg/dL 9.3 9.4 11.3(H)  Total Protein 6.5 - 8.1 g/dL 7.3 - 6.9  Total Bilirubin 0.3 - 1.2 mg/dL 0.3 - 0.4  Alkaline Phos 38 - 126 U/L 54 - 57  AST 15 - 41 U/L 20 - 19  ALT 0 - 44 U/L 20 - 16   Component     Latest Ref Rng & Units 04/06/2019  IgG (Immunoglobin G), Serum     586 - 1,602 mg/dL 1,256  IgA     64 - 422 mg/dL 316  IgM (Immunoglobulin M), Srm     26 - 217 mg/dL 53  Total Protein ELP  6.0 - 8.5 g/dL 6.6  Albumin SerPl Elph-Mcnc     2.9 - 4.4 g/dL 3.4  Alpha 1     0.0 - 0.4 g/dL 0.3  Alpha2 Glob SerPl Elph-Mcnc     0.4 - 1.0 g/dL 0.7  B-Globulin SerPl Elph-Mcnc     0.7 - 1.3 g/dL 1.0  Gamma Glob SerPl Elph-Mcnc     0.4 - 1.8 g/dL 1.2  M Protein SerPl Elph-Mcnc     Not Observed g/dL Not Observed  Globulin, Total     2.2 - 3.9 g/dL 3.2  Albumin/Glob SerPl     0.7 - 1.7 1.1    IFE 1      Comment  Please Note (HCV):      Comment  PTH, Intact     15 - 65 pg/mL 44  Calcium, Total (PTH)     8.7 - 10.3 mg/dL 9.4  PTH Interp      Comment  Kappa free light chain     3.3 - 19.4 mg/L 45.0 (H)  Lamda free light chains     5.7 - 26.3 mg/L 23.2  Kappa, lamda light chain ratio     0.26 - 1.65 1.94 (H)  LDH     98 - 192 U/L 183  PTH-related peptide     pmol/L <2.0  Vitamin D, 25-Hydroxy     30 - 100 ng/mL 76.40  Vit D, 1,25-Dihydroxy     19.9 - 79.3 pg/mL 35.2  Sed Rate     0 - 22 mm/hr 13    04/14/2019 DG Bone Survey Met (Accession [id number])    RADIOGRAPHIC STUDIES: I have personally reviewed the radiological images as listed and agreed with the findings in the report. Dg Bone Survey Met  Result Date: 04/14/2019 CLINICAL DATA:  Hypercalcemia question bone lesions EXAM: METASTATIC BONE SURVEY COMPARISON:  Chest radiograph 03/02/2019, lumbar spine radiographs 10/03/2014 FINDINGS: Diffuse osseous demineralization. Prior median sternotomy. Degenerative changes RIGHT AC joint. Multilevel degenerative disc and facet disease changes of the cervical, thoracic, and lumbar spine. Compression fractures identified at L3 and approximately T6 , stable at T6 and progressive at L3. Dextroconvex lumbar scoliosis apex L3. Facet degenerative changes lower lumbar spine with grade 1 anterolisthesis L5-S1 Atherosclerotic calcifications aorta. Emphysematous changes without pulmonary infiltrate or pleural effusion. Degenerative changes at medial compartments of both knees. No lytic or destructive bone lesions are identified. IMPRESSION: Osseous demineralization with scattered degenerative changes as above. Compression fractures of L3 and approximately T6. No lytic or sclerotic bone lesions identified. Aortic Atherosclerosis (ICD10-I70.0) and Emphysema (ICD10-J43.9). Electronically Signed   By: Lavonia Dana M.D.   On: 04/14/2019 12:27     ASSESSMENT & PLAN:  [name] is a 85  y.o. female with:  1. R/o Monoclonal paraproteinemia/Plasma cell dyscrasia. Patient with renal insufficiency and hypercalcemia  2. Hypercalcemia likely excessive vit d replacement -- now resolved after holding Vit D PLAN: -Discussed pt labwork today, 04/06/2019; all values are WNL except for MCV at 102.1, Kappa free light chain at 45.0, Kappa lamda light chain ratio at  1.94, Glucose Bld at 115, BUN at 26, Creatinine at 1.82, GFR Est Non Af Am at 25, GFR Est AFR AM at 29. -Discussed 04/21/19 calcium, total (PTH) at 9.4 -Discussed 04/21/19 Hemoglobin at 13.7 -no evidence of multiple myeloma -- no M spike. Bone survey neg for lytic bone lesions. -other w/u for hypercalcemia discussed with patient  FOLLOW UP: rtc with Dr Raliegh Scarlet PCP  . No orders of the  defined types were placed in this encounter.  I connected with  Carrell A Flett on 04/21/19 by a video enabled telemedicine application and verified that I am speaking with the correct person using two identifiers.   I discussed the limitations of evaluation and management by telemedicine. The patient expressed understanding and agreed to proceed.   The total time spent in the appt was 15 minutes and more than 50% was on counseling and direct patient cares.  All of the patient's questions were answered with apparent satisfaction. The patient knows to call the clinic with any problems, questions or concerns.     Sullivan Lone MD MS AAHIVMS [facility] Hematology/Oncology Physician [facility]  (Office):       [phone] (Work cell):  [phone] (Fax):           [phone]  04/21/2019 11:38 AM I, Scot Dock, am acting as a scribe for Dr. Sullivan Lone.   .I have reviewed the above documentation for accuracy and completeness, and I agree with the above. Brunetta Genera MD

## 2019-04-24 ENCOUNTER — Telehealth: Payer: Self-pay | Admitting: Hematology

## 2019-04-24 NOTE — Telephone Encounter (Signed)
No los per 12/4.

## 2019-04-26 ENCOUNTER — Other Ambulatory Visit: Payer: Self-pay

## 2019-04-26 MED ORDER — SIMVASTATIN 20 MG PO TABS: 20.0000 mg | ORAL_TABLET | Freq: Every day | ORAL | 0 refills | Status: DC

## 2019-05-16 DIAGNOSIS — Z803 Family history of malignant neoplasm of breast: Secondary | ICD-10-CM | POA: Diagnosis not present

## 2019-05-16 DIAGNOSIS — Z1231 Encounter for screening mammogram for malignant neoplasm of breast: Secondary | ICD-10-CM | POA: Diagnosis not present

## 2019-05-16 LAB — HM MAMMOGRAPHY

## 2019-05-19 DIAGNOSIS — J449 Chronic obstructive pulmonary disease, unspecified: Secondary | ICD-10-CM | POA: Diagnosis not present

## 2019-05-19 DIAGNOSIS — J438 Other emphysema: Secondary | ICD-10-CM | POA: Diagnosis not present

## 2019-06-01 ENCOUNTER — Ambulatory Visit: Payer: Medicare Other | Attending: Internal Medicine

## 2019-06-01 DIAGNOSIS — Z23 Encounter for immunization: Secondary | ICD-10-CM | POA: Insufficient documentation

## 2019-06-01 NOTE — Progress Notes (Signed)
   Covid-19 Vaccination Clinic  Name:  RHILEY TARVER    MRN: [id number] DOB: 1929-11-05  06/01/2019  Ms. Morrow was observed post Covid-19 immunization for 30 minutes based on pre-vaccination screening without incidence. She was provided with Vaccine Information Sheet and instruction to access the V-Safe system.   Ms. Hankinson was instructed to call 911 with any severe reactions post vaccine: Marland Kitchen Difficulty breathing  . Swelling of your face and throat  . A fast heartbeat  . A bad rash all over your body  . Dizziness and weakness    Immunizations Administered    Name Date Dose VIS Date Route   Pfizer COVID-19 Vaccine 06/01/2019 11:02 AM 0.3 mL 04/28/2019 Intramuscular   Manufacturer: [employer], [employer]   Lot: [id number]   [place]: [id number]

## 2019-06-15 DIAGNOSIS — R809 Proteinuria, unspecified: Secondary | ICD-10-CM | POA: Diagnosis not present

## 2019-06-15 DIAGNOSIS — N184 Chronic kidney disease, stage 4 (severe): Secondary | ICD-10-CM | POA: Diagnosis not present

## 2019-06-15 DIAGNOSIS — R82998 Other abnormal findings in urine: Secondary | ICD-10-CM | POA: Diagnosis not present

## 2019-06-15 DIAGNOSIS — I129 Hypertensive chronic kidney disease with stage 1 through stage 4 chronic kidney disease, or unspecified chronic kidney disease: Secondary | ICD-10-CM | POA: Diagnosis not present

## 2019-06-15 DIAGNOSIS — N200 Calculus of kidney: Secondary | ICD-10-CM | POA: Diagnosis not present

## 2019-06-19 ENCOUNTER — Other Ambulatory Visit: Payer: Self-pay | Admitting: Nephrology

## 2019-06-19 DIAGNOSIS — J449 Chronic obstructive pulmonary disease, unspecified: Secondary | ICD-10-CM | POA: Diagnosis not present

## 2019-06-19 DIAGNOSIS — N184 Chronic kidney disease, stage 4 (severe): Secondary | ICD-10-CM

## 2019-06-19 DIAGNOSIS — J438 Other emphysema: Secondary | ICD-10-CM | POA: Diagnosis not present

## 2019-06-22 ENCOUNTER — Encounter: Payer: Self-pay | Admitting: Interventional Cardiology

## 2019-06-22 ENCOUNTER — Other Ambulatory Visit: Payer: Self-pay

## 2019-06-22 ENCOUNTER — Ambulatory Visit: Payer: Medicare Other | Admitting: Interventional Cardiology

## 2019-06-22 ENCOUNTER — Ambulatory Visit: Payer: Medicare Other | Attending: Internal Medicine

## 2019-06-22 VITALS — BP 142/78 | HR 77 | Ht 64.0 in | Wt 114.8 lb

## 2019-06-22 DIAGNOSIS — I480 Paroxysmal atrial fibrillation: Secondary | ICD-10-CM

## 2019-06-22 DIAGNOSIS — Z7189 Other specified counseling: Secondary | ICD-10-CM

## 2019-06-22 DIAGNOSIS — I10 Essential (primary) hypertension: Secondary | ICD-10-CM

## 2019-06-22 DIAGNOSIS — Z952 Presence of prosthetic heart valve: Secondary | ICD-10-CM

## 2019-06-22 DIAGNOSIS — J432 Centrilobular emphysema: Secondary | ICD-10-CM | POA: Diagnosis not present

## 2019-06-22 DIAGNOSIS — Z23 Encounter for immunization: Secondary | ICD-10-CM

## 2019-06-22 NOTE — Progress Notes (Signed)
Cardiology Office Note:    Date:  06/22/2019   ID:  [name], DOB 08-09-1929, MRN [id number]  PCP:  Mellody Dance, DO  Cardiologist:  Sinclair Grooms, MD   Referring MD: Mellody Dance, DO   Chief Complaint  Patient presents with  . Cardiac Valve Problem    Bioprosthetic aortic valve    History of Present Illness:    [name] is a 86 y.o. female with a hx of Hypertension, aortic valve bioprosthesis 2012, carotid disease, COPD, and hyperlipidemia.  Exertional tolerance is limited by hypoxemia related to COPD.  No dyspnea at rest but wears oxygen 24/7.  She denies orthopnea, palpitations, peripheral edema, syncope, and wheezing.  Past Medical History:  Diagnosis Date  . Allergic rhinitis   . Aortic stenosis   . Carotid artery disease ([place])   . Colon polyps   . h/o Atrial fibrillation ([place]) 03/22/2011   Brief post op   . Hematuria 11/05/2015  . Hypercalcemia   . Hypertension   . Hypothyroid   . Osteopenia   . 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 Pressure urticaria   . RLS (restless legs syndrome)   . S/P aortic valve replacement and aortoplasty 04/13/2011   66m Medtronic Freestyle porcine aortic root   . S/P aortic valve replacement with stentless valve 03/18/2011    Past Surgical History:  Procedure Laterality Date  . AORTIC ROOT REPLACEMENT  03/18/2011   254mMedtronic Freestyle Porcine aortic root with reimplantation of coronary arteries  . BREAST LUMPECTOMY     Left  . CATARACT EXTRACTION    . CHOLECYSTECTOMY    . FOOT SURGERY     Morton's neuroma  . KNEE ARTHROSCOPY     Left    Current Medications: Current Meds  Medication Sig  . albuterol (PROAIR HFA) 108 (90 Base) MCG/ACT inhaler Inhale 2 puffs into the lungs every 6 (six) hours as needed for wheezing or shortness of breath.  . Marland KitchenmLODipine (NORVASC) 5 MG tablet Take 1/2 (one-half) tablet by mouth once daily  . aspirin 81 MG tablet Take 81 mg by mouth daily.  . Marland KitchenuPROPion (WELLBUTRIN) 75 MG  tablet 1/2 tablet twice daily for 2 weeks then 1 full tablet twice daily  . levothyroxine (SYNTHROID) 112 MCG tablet Take 1 tablet by mouth once daily  . Melatonin 10 MG TABS Take by mouth.  . predniSONE (DELTASONE) 5 MG tablet Take 1 tablet by mouth once daily with breakfast  . simvastatin (ZOCOR) 20 MG tablet Take 1 tablet (20 mg total) by mouth at bedtime.  . Marland KitchenTIOLTO RESPIMAT 2.5-2.5 MCG/ACT AERS INHALE 2 PUFFS BY MOUTH ONCE DAILY. PLEASE SEE DR FOR REFILLS     Allergies:   Clarithromycin, Tamsulosin, Trazodone and nefazodone, and Penicillins   Social History   Socioeconomic History  . Marital status: Widowed    Spouse name: Not on file  . Number of children: N  . Years of education: Not on file  . Highest education level: Not on file  Occupational History  . Occupation: housewife  Tobacco Use  . Smoking status: Former Smoker    Packs/day: 0.90    Years: 62.00    Pack years: 55.80    Types: Cigarettes    Quit date: 07/14/2010    Years since quitting: 8.9  . Smokeless tobacco: Never Used  Substance and Sexual Activity  . Alcohol use: No    Alcohol/week: 0.0 standard drinks  . Drug use: No  . Sexual activity: Never  Other  Topics Concern  . Not on file  Social History Narrative  . Not on file   Social Determinants of Health   Financial Resource Strain:   . Difficulty of Paying Living Expenses: Not on file  Food Insecurity:   . Worried About Charity fundraiser in the Last Year: Not on file  . Ran Out of Food in the Last Year: Not on file  Transportation Needs:   . Lack of Transportation (Medical): Not on file  . Lack of Transportation (Non-Medical): Not on file  Physical Activity:   . Days of Exercise per Week: Not on file  . Minutes of Exercise per Session: Not on file  Stress:   . Feeling of Stress : Not on file  Social Connections:   . Frequency of Communication with Friends and Family: Not on file  . Frequency of Social Gatherings with Friends and Family:  Not on file  . Attends Religious Services: Not on file  . Active Member of Clubs or Organizations: Not on file  . Attends Archivist Meetings: Not on file  . Marital Status: Not on file     Family History: The patient's family history includes Asthma in her maternal aunt and maternal uncle; Breast cancer in her sister; Cancer in her mother; Emphysema in her maternal aunt; Heart disease in her maternal grandmother; Lung cancer in her father; Uterine cancer in her sister.  ROS:   Please see the history of present illness.    Has received COVID-19 vaccine and is scheduled to get her second shot today.  Physical activity is limited due to her lungs.  She is upbeat and overall feels quite well.  Appetite is stable.  All other systems reviewed and are negative.  EKGs/Labs/Other Studies Reviewed:    The following studies were reviewed today: No new data  EKG:  EKG normal sinus rhythm, biatrial abnormality.  Poor R wave progression V1 through V3. There is no change c/w prior from 06/2017.  Recent Labs: 03/06/2019: Magnesium 2.4; TSH 1.210 04/06/2019: ALT 20; BUN 26; Creatinine 1.82; Hemoglobin 13.7; Platelets 185; Potassium 5.0; Sodium 141  Recent Lipid Panel    Component Value Date/Time   CHOL 151 03/06/2019 1009   TRIG 85 03/06/2019 1009   HDL 81 03/06/2019 1009   CHOLHDL 1.9 03/06/2019 1009   CHOLHDL 2.1 11/08/2015 1011   VLDL 17 11/08/2015 1011   LDLCALC 54 03/06/2019 1009    Physical Exam:    VS:  BP (!) 142/78   Pulse 77   Ht _0  (1.626 m)   Wt 114 lb 12.8 oz (52.1 kg)   SpO2 96%   BMI 19.71 kg/m     Wt Readings from Last 3 Encounters:  06/22/19 114 lb 12.8 oz (52.1 kg)  04/06/19 115 lb 4.8 oz (52.3 kg)  02/28/19 115 lb 8 oz (52.4 kg)     GEN: Slender and compatible with her age. No acute distress HEENT: Normal NECK: No JVD. LYMPHATICS: No lymphadenopathy CARDIAC:  RRR without murmur, gallop, or edema. VASCULAR:  Normal Pulses. No  bruits. RESPIRATORY:  Clear to auscultation without rales, wheezing or rhonchi  ABDOMEN: Soft, non-tender, non-distended, No pulsatile mass, MUSCULOSKELETAL: No deformity  SKIN: Warm and dry NEUROLOGIC:  Alert and oriented x 3 PSYCHIATRIC:  Normal affect   ASSESSMENT:    1. S/P aortic valve replacement and aortoplasty   2. Paroxysmal atrial fibrillation (HCC)   3. Centrilobular emphysema ([place])   4. Essential hypertension   5.  Educated about COVID-19 virus infection    PLAN:    In order of problems listed above:  1. No significant murmur or auscultatory evidence of bioprosthetic valve dysfunction. 2. She denies palpitations and there have been no clinical instances of atrial fibrillation. 3. Chronic O2 requirement. 4. Blood pressure is stable and at target for her age which is 140/80 mmHg or less. 5. 3W's and COVID-19 vaccine are endorsed by the patient.   Plan clinical follow-up in 1 year.   Medication Adjustments/Labs and Tests Ordered: Current medicines are reviewed at length with the patient today.  Concerns regarding medicines are outlined above.  Orders Placed This Encounter  Procedures  . EKG 12-Lead   No orders of the defined types were placed in this encounter.   Patient Instructions  Medication Instructions:  Your physician recommends that you continue on your current medications as directed. Please refer to the Current Medication list given to you today.  *If you need a refill on your cardiac medications before your next appointment, please call your pharmacy*  Lab Work: None If you have labs (blood work) drawn today and your tests are completely normal, you will receive your results only by: Marland Kitchen MyChart Message (if you have MyChart) OR . A paper copy in the mail If you have any lab test that is abnormal or we need to change your treatment, we will call you to review the results.  Testing/Procedures: None  Follow-Up: At [facility], you and your  health needs are our priority.  As part of our continuing mission to provide you with exceptional heart care, we have created designated Provider Care Teams.  These Care Teams include your primary Cardiologist (physician) and Advanced Practice Providers (APPs -  Physician Assistants and Nurse Practitioners) who all work together to provide you with the care you need, when you need it.  Your next appointment:   12 month(s)  The format for your next appointment:   In Person  Provider:   You may see Sinclair Grooms, MD or one of the following Advanced Practice Providers on your designated Care Team:    Truitt Merle, NP  Cecilie Kicks, NP  Kathyrn Drown, NP   Other Instructions      Signed, Sinclair Grooms, MD  06/22/2019 10:03 AM    [place]

## 2019-06-22 NOTE — Patient Instructions (Signed)
Medication Instructions:  Your physician recommends that you continue on your current medications as directed. Please refer to the Current Medication list given to you today.  *If you need a refill on your cardiac medications before your next appointment, please call your pharmacy*  Lab Work: None If you have labs (blood work) drawn today and your tests are completely normal, you will receive your results only by: Marland Kitchen MyChart Message (if you have MyChart) OR . A paper copy in the mail If you have any lab test that is abnormal or we need to change your treatment, we will call you to review the results.  Testing/Procedures: None  Follow-Up: At [facility], you and your health needs are our priority.  As part of our continuing mission to provide you with exceptional heart care, we have created designated Provider Care Teams.  These Care Teams include your primary Cardiologist (physician) and Advanced Practice Providers (APPs -  Physician Assistants and Nurse Practitioners) who all work together to provide you with the care you need, when you need it.  Your next appointment:   12 month(s)  The format for your next appointment:   In Person  Provider:   You may see Sinclair Grooms, MD or one of the following Advanced Practice Providers on your designated Care Team:    Truitt Merle, NP  Cecilie Kicks, NP  Kathyrn Drown, NP   Other Instructions

## 2019-06-22 NOTE — Progress Notes (Signed)
   Covid-19 Vaccination Clinic  Name:  [name]    MRN: [id number] DOB: January 05, 1930  06/22/2019  [name] was observed post Covid-19 immunization for 30 minutes based on pre-vaccination screening without incidence. She was provided with Vaccine Information Sheet and instruction to access the V-Safe system.   [name] was instructed to call 911 with any severe reactions post vaccine: Marland Kitchen Difficulty breathing  . Swelling of your face and throat  . A fast heartbeat  . A bad rash all over your body  . Dizziness and weakness    Immunizations Administered    Name Date Dose VIS Date Route   Pfizer COVID-19 Vaccine 06/22/2019 11:38 AM 0.3 mL 04/28/2019 Intramuscular   Manufacturer: [place]   Lot: [id number]   [place]: [id number]

## 2019-06-25 ENCOUNTER — Emergency Department (HOSPITAL_COMMUNITY): Payer: Medicare Other

## 2019-06-25 ENCOUNTER — Encounter (HOSPITAL_COMMUNITY): Payer: Self-pay | Admitting: Emergency Medicine

## 2019-06-25 ENCOUNTER — Other Ambulatory Visit: Payer: Self-pay

## 2019-06-25 ENCOUNTER — Observation Stay (HOSPITAL_BASED_OUTPATIENT_CLINIC_OR_DEPARTMENT_OTHER): Payer: Medicare Other

## 2019-06-25 ENCOUNTER — Observation Stay (HOSPITAL_COMMUNITY)
Admission: EM | Admit: 2019-06-25 | Discharge: 2019-06-26 | Disposition: A | Discharge status: Home / Self Care | Payer: Medicare Other | Attending: Family Medicine | Admitting: Family Medicine

## 2019-06-25 DIAGNOSIS — N183 Chronic kidney disease, stage 3 unspecified: Secondary | ICD-10-CM | POA: Insufficient documentation

## 2019-06-25 DIAGNOSIS — J432 Centrilobular emphysema: Secondary | ICD-10-CM | POA: Diagnosis not present

## 2019-06-25 DIAGNOSIS — E785 Hyperlipidemia, unspecified: Secondary | ICD-10-CM | POA: Diagnosis not present

## 2019-06-25 DIAGNOSIS — I779 Disorder of arteries and arterioles, unspecified: Secondary | ICD-10-CM | POA: Diagnosis present

## 2019-06-25 DIAGNOSIS — Z79899 Other long term (current) drug therapy: Secondary | ICD-10-CM | POA: Diagnosis not present

## 2019-06-25 DIAGNOSIS — Z954 Presence of other heart-valve replacement: Secondary | ICD-10-CM | POA: Diagnosis not present

## 2019-06-25 DIAGNOSIS — R079 Chest pain, unspecified: Secondary | ICD-10-CM | POA: Diagnosis not present

## 2019-06-25 DIAGNOSIS — I10 Essential (primary) hypertension: Secondary | ICD-10-CM | POA: Diagnosis not present

## 2019-06-25 DIAGNOSIS — R42 Dizziness and giddiness: Secondary | ICD-10-CM | POA: Diagnosis not present

## 2019-06-25 DIAGNOSIS — J449 Chronic obstructive pulmonary disease, unspecified: Secondary | ICD-10-CM | POA: Diagnosis not present

## 2019-06-25 DIAGNOSIS — E039 Hypothyroidism, unspecified: Secondary | ICD-10-CM | POA: Insufficient documentation

## 2019-06-25 DIAGNOSIS — Z952 Presence of prosthetic heart valve: Secondary | ICD-10-CM | POA: Insufficient documentation

## 2019-06-25 DIAGNOSIS — Z7982 Long term (current) use of aspirin: Secondary | ICD-10-CM | POA: Diagnosis not present

## 2019-06-25 DIAGNOSIS — I160 Hypertensive urgency: Secondary | ICD-10-CM | POA: Insufficient documentation

## 2019-06-25 DIAGNOSIS — R0789 Other chest pain: Principal | ICD-10-CM | POA: Insufficient documentation

## 2019-06-25 DIAGNOSIS — I491 Atrial premature depolarization: Secondary | ICD-10-CM | POA: Diagnosis not present

## 2019-06-25 DIAGNOSIS — Z7952 Long term (current) use of systemic steroids: Secondary | ICD-10-CM | POA: Diagnosis not present

## 2019-06-25 DIAGNOSIS — R634 Abnormal weight loss: Secondary | ICD-10-CM | POA: Diagnosis not present

## 2019-06-25 DIAGNOSIS — I129 Hypertensive chronic kidney disease with stage 1 through stage 4 chronic kidney disease, or unspecified chronic kidney disease: Secondary | ICD-10-CM | POA: Insufficient documentation

## 2019-06-25 DIAGNOSIS — R7303 Prediabetes: Secondary | ICD-10-CM | POA: Insufficient documentation

## 2019-06-25 DIAGNOSIS — J9611 Chronic respiratory failure with hypoxia: Secondary | ICD-10-CM | POA: Diagnosis present

## 2019-06-25 DIAGNOSIS — Z743 Need for continuous supervision: Secondary | ICD-10-CM | POA: Diagnosis not present

## 2019-06-25 DIAGNOSIS — Z87891 Personal history of nicotine dependence: Secondary | ICD-10-CM | POA: Insufficient documentation

## 2019-06-25 DIAGNOSIS — J984 Other disorders of lung: Secondary | ICD-10-CM | POA: Diagnosis not present

## 2019-06-25 DIAGNOSIS — Z20822 Contact with and (suspected) exposure to covid-19: Secondary | ICD-10-CM | POA: Insufficient documentation

## 2019-06-25 DIAGNOSIS — J441 Chronic obstructive pulmonary disease with (acute) exacerbation: Secondary | ICD-10-CM

## 2019-06-25 DIAGNOSIS — I2510 Atherosclerotic heart disease of native coronary artery without angina pectoris: Secondary | ICD-10-CM | POA: Insufficient documentation

## 2019-06-25 DIAGNOSIS — R519 Headache, unspecified: Secondary | ICD-10-CM | POA: Diagnosis not present

## 2019-06-25 DIAGNOSIS — N1330 Unspecified hydronephrosis: Secondary | ICD-10-CM | POA: Diagnosis present

## 2019-06-25 DIAGNOSIS — G4489 Other headache syndrome: Secondary | ICD-10-CM | POA: Diagnosis not present

## 2019-06-25 DIAGNOSIS — I4891 Unspecified atrial fibrillation: Secondary | ICD-10-CM | POA: Insufficient documentation

## 2019-06-25 DIAGNOSIS — I700 Atherosclerosis of aorta: Secondary | ICD-10-CM | POA: Diagnosis not present

## 2019-06-25 DIAGNOSIS — N1832 Chronic kidney disease, stage 3b: Secondary | ICD-10-CM | POA: Diagnosis present

## 2019-06-25 LAB — COMPREHENSIVE METABOLIC PANEL
ALT: 16 U/L (ref 0–44)
AST: 22 U/L (ref 15–41)
Albumin: 3 g/dL — ABNORMAL LOW (ref 3.5–5.0)
Alkaline Phosphatase: 46 U/L (ref 38–126)
Anion gap: 9 (ref 5–15)
BUN: 20 mg/dL (ref 8–23)
CO2: 30 mmol/L (ref 22–32)
Calcium: 9.9 mg/dL (ref 8.9–10.3)
Chloride: 101 mmol/L (ref 98–111)
Creatinine, Ser: 1.32 mg/dL — ABNORMAL HIGH (ref 0.44–1.00)
GFR calc Af Amer: 42 mL/min — ABNORMAL LOW (ref >60)
GFR calc non Af Amer: 36 mL/min — ABNORMAL LOW (ref >60)
Glucose, Bld: 100 mg/dL — ABNORMAL HIGH (ref 70–99)
Potassium: 4.4 mmol/L (ref 3.5–5.1)
Sodium: 140 mmol/L (ref 135–145)
Total Bilirubin: 0.5 mg/dL (ref 0.3–1.2)
Total Protein: 6.7 g/dL (ref 6.5–8.1)

## 2019-06-25 LAB — CBC WITH DIFFERENTIAL/PLATELET
Abs Immature Granulocytes: 0.03 10*3/uL (ref 0.00–0.07)
Basophils Absolute: 0 10*3/uL (ref 0.0–0.1)
Basophils Relative: 0 %
Eosinophils Absolute: 0.2 10*3/uL (ref 0.0–0.5)
Eosinophils Relative: 3 %
HCT: 43.8 % (ref 36.0–46.0)
Hemoglobin: 13.9 g/dL (ref 12.0–15.0)
Immature Granulocytes: 0 %
Lymphocytes Relative: 12 %
Lymphs Abs: 1.1 10*3/uL (ref 0.7–4.0)
MCH: 31.6 pg (ref 26.0–34.0)
MCHC: 31.7 g/dL (ref 30.0–36.0)
MCV: 99.5 fL (ref 80.0–100.0)
Monocytes Absolute: 0.8 10*3/uL (ref 0.1–1.0)
Monocytes Relative: 9 %
Neutro Abs: 6.9 10*3/uL (ref 1.7–7.7)
Neutrophils Relative %: 76 %
Platelets: 193 10*3/uL (ref 150–400)
RBC: 4.4 MIL/uL (ref 3.87–5.11)
RDW: 12.3 % (ref 11.5–15.5)
WBC: 9.2 10*3/uL (ref 4.0–10.5)
nRBC: 0 % (ref 0.0–0.2)

## 2019-06-25 LAB — TROPONIN I (HIGH SENSITIVITY)
Troponin I (High Sensitivity): 11 ng/L (ref <18)
Troponin I (High Sensitivity): 21 ng/L — ABNORMAL HIGH (ref <18)
Troponin I (High Sensitivity): 33 ng/L — ABNORMAL HIGH (ref <18)
Troponin I (High Sensitivity): 30 ng/L — ABNORMAL HIGH (ref <18)

## 2019-06-25 LAB — TSH: TSH: 0.792 u[IU]/mL (ref 0.350–4.500)

## 2019-06-25 LAB — LIPASE, BLOOD: Lipase: 28 U/L (ref 11–51)

## 2019-06-25 LAB — CK TOTAL AND CKMB (NOT AT ARMC)
CK, MB: 4.8 ng/mL (ref 0.5–5.0)
Relative Index: INVALID (ref 0.0–2.5)
Total CK: 81 U/L (ref 38–234)

## 2019-06-25 LAB — SARS CORONAVIRUS 2 (TAT 6-24 HRS): SARS Coronavirus 2: NEGATIVE

## 2019-06-25 LAB — ECHOCARDIOGRAM COMPLETE

## 2019-06-25 MED ORDER — SODIUM CHLORIDE 0.9 % IV BOLUS: 500.0000 mL | Freq: Once | INTRAVENOUS | Status: DC

## 2019-06-25 MED ORDER — SODIUM CHLORIDE 0.9 % IV BOLUS
500.0000 mL | Freq: Once | INTRAVENOUS | Status: AC
  Administered 2019-06-25: 500 mL via INTRAVENOUS

## 2019-06-25 MED ORDER — BUPROPION HCL 75 MG PO TABS
75.0000 mg | ORAL_TABLET | Freq: Two times a day (BID) | ORAL | Status: DC
  Administered 2019-06-25 – 2019-06-26 (×2): 75 mg via ORAL
  Filled 2019-06-25 (×4): qty 1

## 2019-06-25 MED ORDER — ACETAMINOPHEN 325 MG PO TABS: 650.0000 mg | ORAL_TABLET | ORAL | Status: DC | PRN

## 2019-06-25 MED ORDER — PREDNISONE 5 MG PO TABS
5.0000 mg | ORAL_TABLET | Freq: Once | ORAL | Status: AC
  Administered 2019-06-25: 5 mg via ORAL
  Filled 2019-06-25: qty 1

## 2019-06-25 MED ORDER — LABETALOL HCL 5 MG/ML IV SOLN
10.0000 mg | Freq: Once | INTRAVENOUS | Status: AC
  Administered 2019-06-25: 10 mg via INTRAVENOUS
  Filled 2019-06-25: qty 4

## 2019-06-25 MED ORDER — ASPIRIN EC 81 MG PO TBEC
81.0000 mg | DELAYED_RELEASE_TABLET | Freq: Every day | ORAL | Status: DC
  Administered 2019-06-26: 81 mg via ORAL
  Filled 2019-06-25 (×2): qty 1

## 2019-06-25 MED ORDER — DEXTROSE-NACL 5-0.45 % IV SOLN: INTRAVENOUS | Status: AC

## 2019-06-25 MED ORDER — ALBUTEROL SULFATE (2.5 MG/3ML) 0.083% IN NEBU: 2.5000 mg | INHALATION_SOLUTION | Freq: Four times a day (QID) | RESPIRATORY_TRACT | Status: DC | PRN

## 2019-06-25 MED ORDER — MELATONIN 3 MG PO TABS
9.0000 mg | ORAL_TABLET | Freq: Every day | ORAL | Status: DC
  Administered 2019-06-25: 9 mg via ORAL
  Filled 2019-06-25 (×2): qty 3

## 2019-06-25 MED ORDER — PANTOPRAZOLE SODIUM 40 MG PO TBEC
40.0000 mg | DELAYED_RELEASE_TABLET | Freq: Two times a day (BID) | ORAL | Status: DC
  Administered 2019-06-25 – 2019-06-26 (×3): 40 mg via ORAL
  Filled 2019-06-25 (×3): qty 1

## 2019-06-25 MED ORDER — ALBUTEROL SULFATE HFA 108 (90 BASE) MCG/ACT IN AERS: 2.0000 | INHALATION_SPRAY | Freq: Four times a day (QID) | RESPIRATORY_TRACT | Status: DC | PRN

## 2019-06-25 MED ORDER — ACETAMINOPHEN 325 MG PO TABS
650.0000 mg | ORAL_TABLET | Freq: Once | ORAL | Status: AC
  Administered 2019-06-25: 650 mg via ORAL
  Filled 2019-06-25: qty 2

## 2019-06-25 MED ORDER — LEVOTHYROXINE SODIUM 112 MCG PO TABS: 112.0000 ug | ORAL_TABLET | Freq: Every day | ORAL | Status: DC

## 2019-06-25 MED ORDER — HEPARIN BOLUS VIA INFUSION
3000.0000 [IU] | Freq: Once | INTRAVENOUS | Status: AC
  Administered 2019-06-25: 3000 [IU] via INTRAVENOUS
  Filled 2019-06-25: qty 3000

## 2019-06-25 MED ORDER — LEVOTHYROXINE SODIUM 112 MCG PO TABS
112.0000 ug | ORAL_TABLET | Freq: Every day | ORAL | Status: DC
  Administered 2019-06-25 – 2019-06-26 (×2): 112 ug via ORAL
  Filled 2019-06-25 (×2): qty 1

## 2019-06-25 MED ORDER — AMLODIPINE BESYLATE 5 MG PO TABS
5.0000 mg | ORAL_TABLET | Freq: Once | ORAL | Status: AC
  Administered 2019-06-25: 5 mg via ORAL
  Filled 2019-06-25: qty 1

## 2019-06-25 MED ORDER — ONDANSETRON HCL 4 MG/2ML IJ SOLN: 4.0000 mg | Freq: Four times a day (QID) | INTRAMUSCULAR | Status: DC | PRN

## 2019-06-25 MED ORDER — AMLODIPINE BESYLATE 2.5 MG PO TABS
2.5000 mg | ORAL_TABLET | Freq: Every day | ORAL | Status: DC
  Administered 2019-06-26: 2.5 mg via ORAL
  Filled 2019-06-25 (×2): qty 1

## 2019-06-25 MED ORDER — HEPARIN (PORCINE) 25000 UT/250ML-% IV SOLN
750.0000 [IU]/h | INTRAVENOUS | Status: DC
  Administered 2019-06-25: 600 [IU]/h via INTRAVENOUS
  Filled 2019-06-25: qty 250

## 2019-06-25 MED ORDER — SIMVASTATIN 20 MG PO TABS
20.0000 mg | ORAL_TABLET | Freq: Every day | ORAL | Status: DC
  Administered 2019-06-25: 20 mg via ORAL
  Filled 2019-06-25: qty 1

## 2019-06-25 MED ORDER — PREDNISONE 5 MG PO TABS
5.0000 mg | ORAL_TABLET | Freq: Every day | ORAL | Status: DC
  Administered 2019-06-26: 5 mg via ORAL
  Filled 2019-06-25: qty 1

## 2019-06-25 MED ORDER — IOHEXOL 350 MG/ML SOLN
75.0000 mL | Freq: Once | INTRAVENOUS | Status: AC | PRN
  Administered 2019-06-25: 75 mL via INTRAVENOUS

## 2019-06-25 NOTE — ED Notes (Signed)
Pt is NSR w/PVCs on monitor

## 2019-06-25 NOTE — ED Notes (Signed)
Gave pt a coke to drink

## 2019-06-25 NOTE — H&P (Signed)
History and Physical    [name]:[id number] DOB: 19-Jun-1929 DOA: 06/25/2019  PCP: Mellody Dance, DO (Confirm with patient/family/NH records and if not entered, this has to be entered at [facility] point of entry) Patient coming from: coming from home  I have personally briefly reviewed patient's old medical records in [place]  Chief Complaint: Chest pain  HPI: [name] is a 86 y.o. female with medical history significant of PAF, AVR-tissue, HTN, oxygen dependent/steroid dependent COPD/emphysema who awoke at 6 AM with severe HA. She checked her BP which she reports as >200/180. Subsequently she had marked chest pain, described as substernal, heavy-pressure like in nature. After about an hour she called EMS. In route she was given NTG SL and 4 ASA with complete resolution of her chest pain. She presents to [facility] for further evaluation.  (For level 3, the HPI must include 4+ descriptors: Location, Quality, Severity, Duration, Timing, Context, modifying factors, associated signs/symptoms and/or status of 3+ chronic problems.)  (Please avoid self-populating past medical history here) (The initial 2-3 lines should be focused and good to copy and paste in the HPI section of the daily progress note).  ED Course: Hemodynamically stable in ED. EKG w/o acute changes. Troponins 11 , 21. CT head negative. CT/CTA w/o AA, multiple vessels with mild stenosis, significant hydronephrosis right and possible UJP stenosis right ureter. CT chest with 24x15 mm spiculated lesions RLL. Patient seen in consultation by cardiology who recommend admit for CP rule out. TRH called to admit.  Review of Systems: As per HPI otherwise 10 point review of systems negative.  Unacceptable ROS statements: "10 systems reviewed," "Extensive" (without elaboration).  Acceptable ROS statements: "All others negative," "All others reviewed and are negative," and "All others unremarkable," with at [place]  documented Can't double dip - if using for HPI can't use for ROS  Past Medical History:  Diagnosis Date  . Allergic rhinitis   . Aortic stenosis   . Carotid artery disease ([place])   . Colon polyps   . h/o Atrial fibrillation ([place]) 03/22/2011   Brief post op   . Hematuria 11/05/2015  . Hypercalcemia   . Hypertension   . Hypothyroid   . Osteopenia   . 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 Pressure urticaria   . RLS (restless legs syndrome)   . S/P aortic valve replacement and aortoplasty 04/13/2011   48m Medtronic Freestyle porcine aortic root   . S/P aortic valve replacement with stentless valve 03/18/2011    Past Surgical History:  Procedure Laterality Date  . AORTIC ROOT REPLACEMENT  03/18/2011   248mMedtronic Freestyle Porcine aortic root with reimplantation of coronary arteries  . BREAST LUMPECTOMY     Left  . CATARACT EXTRACTION    . CHOLECYSTECTOMY    . FOOT SURGERY     Morton's neuroma  . KNEE ARTHROSCOPY     Left   Social Hx - married 4230ears, widowed 25 years ago. Two sons, 3 living grandchildren, a grand-daughter died at age 1164a grandchild died at birth. 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 Lives alone. Sons live nearby.   reports that she quit smoking about 8 years ago. Her smoking use included cigarettes. She has a 55.80 pack-year smoking history. She has never used smokeless tobacco. She reports that she does not drink alcohol or use drugs.  Allergies  Allergen Reactions  . Clarithromycin     Mouth/throat  sore  . Tamsulosin Itching  . Trazodone And Nefazodone     Dizziness   . Penicillins Itching and Rash    Family History  Problem Relation Age of Onset  . Cancer Mother        bladder  . Lung cancer Father   . Heart disease Maternal Grandmother   . Emphysema Maternal Aunt   . Asthma Maternal Aunt   . Asthma Maternal Uncle   . Breast cancer Sister   . Uterine cancer Sister    Unacceptable:  Noncontributory, unremarkable, or negative. Acceptable: Family history reviewed and not pertinent (If you reviewed it)  Prior to Admission medications   Medication Sig Start Date End Date Taking? Authorizing Provider  albuterol (PROAIR HFA) 108 (90 Base) MCG/ACT inhaler Inhale 2 puffs into the lungs every 6 (six) hours as needed for wheezing or shortness of breath. 10/21/16  Yes Collene Gobble, MD  amLODipine (NORVASC) 5 MG tablet Take 1/2 (one-half) tablet by mouth once daily Patient taking differently: Take 2.5 mg by mouth daily.  04/21/19  Yes Opalski, Deborah, DO  aspirin 81 MG tablet Take 81 mg by mouth daily.   Yes [provider]  buPROPion (WELLBUTRIN) 75 MG tablet 1/2 tablet twice daily for 2 weeks then 1 full tablet twice daily Patient taking differently: Take 75 mg by mouth 2 (two) times daily.  02/28/19  Yes Opalski, Neoma Laming, DO  levothyroxine (SYNTHROID) 112 MCG tablet Take 1 tablet by mouth once daily 04/18/19  Yes Opalski, Neoma Laming, DO  Melatonin 10 MG TABS Take 10 mg by mouth at bedtime.    Yes [provider]  predniSONE (DELTASONE) 5 MG tablet Take 1 tablet by mouth once daily with breakfast Patient taking differently: Take 5 mg by mouth daily with breakfast.  04/18/19  Yes Opalski, Neoma Laming, DO  simvastatin (ZOCOR) 20 MG tablet Take 1 tablet (20 mg total) by mouth at bedtime. 04/26/19  Yes Opalski, Deborah, DO  STIOLTO RESPIMAT 2.5-2.5 MCG/ACT AERS INHALE 2 PUFFS BY MOUTH ONCE DAILY. PLEASE SEE DR FOR REFILLS Patient taking differently: Inhale 2 puffs into the lungs daily.  02/27/19   Collene Gobble, MD    Physical Exam: Vitals:   06/25/19 1030 06/25/19 1139 06/25/19 1200 06/25/19 1239  BP: (!) 154/76 134/77 (!) 118/54 135/61  Pulse: 83 (!) 41 (!) 37 66  Resp:    (!) 27  Temp:      TempSrc:      SpO2: 99% 98% 97% 98%    Constitutional: NAD, calm, comfortable Vitals:   06/25/19 1030 06/25/19 1139 06/25/19 1200 06/25/19 1239  BP: (!) 154/76 134/77 (!)  118/54 135/61  Pulse: 83 (!) 41 (!) 37 66  Resp:    (!) 27  Temp:      TempSrc:      SpO2: 99% 98% 97% 98%   General: slender elderly woman in no distress and good spirits. Eyes: PERRL, lids and conjunctivae normal ENMT: Mucous membranes are moist. Posterior pharynx clear of any exudate or lesions. Edentulous with full dentures.  Neck: normal, supple, no masses, no thyromegaly Respiratory: clear to auscultation bilaterally, no wheezing, no crackles. Normal respiratory effort. No accessory muscle use.  Cardiovascular: Well healed sternotomy scar. Regular rate and rhythm, no murmurs / rubs / gallops. No extremity edema. 2+ pedal pulses.  II/VIcarotid bruits right.  Abdomen: no tenderness, no masses palpated. No hepatosplenomegaly. Bowel sounds positive.  Musculoskeletal: no clubbing / cyanosis. No joint deformity upper and lower extremities. Good ROM,  no contractures. Normal muscle tone.  Skin: no rashes, lesions, ulcers. No induration Neurologic: CN 2-12 grossly intact. Sensation intact, DTR normal. Strength 5/5 in all 4.  Psychiatric: Normal judgment and insight. Alert and oriented x 3. Normal mood.   (Anything < 9 systems with 2 bullets each down codes to level 1) (If patient refuses exam can't bill higher level) (Make sure to document decubitus ulcers present on admission -- if possible -- and whether patient has chronic indwelling catheter at time of admission)  Labs on Admission: I have personally reviewed following labs and imaging studies  CBC: Recent Labs  Lab 06/25/19 0931  WBC 9.2  NEUTROABS 6.9  HGB 13.9  HCT 43.8  MCV 99.5  PLT 016   Basic Metabolic Panel: Recent Labs  Lab 06/25/19 0931  NA 140  K 4.4  CL 101  CO2 30  GLUCOSE 100*  BUN 20  CREATININE 1.32*  CALCIUM 9.9   GFR: Estimated Creatinine Clearance: 24.7 mL/min (A) (by C-G formula based on SCr of 1.32 mg/dL (H)). Liver Function Tests: Recent Labs  Lab 06/25/19 0931  AST 22  ALT 16  ALKPHOS  46  BILITOT 0.5  PROT 6.7  ALBUMIN 3.0*   Recent Labs  Lab 06/25/19 0931  LIPASE 28   No results for input(s): AMMONIA in the last 168 hours. Coagulation Profile: No results for input(s): INR, PROTIME in the last 168 hours. Cardiac Enzymes: No results for input(s): CKTOTAL, CKMB, CKMBINDEX, TROPONINI in the last 168 hours. BNP (last 3 results) No results for input(s): PROBNP in the last 8760 hours. HbA1C: No results for input(s): HGBA1C in the last 72 hours. CBG: No results for input(s): GLUCAP in the last 168 hours. Lipid Profile: No results for input(s): CHOL, HDL, LDLCALC, TRIG, CHOLHDL, LDLDIRECT in the last 72 hours. Thyroid Function Tests: Recent Labs    06/25/19 0931  TSH 0.792   Anemia Panel: No results for input(s): VITAMINB12, FOLATE, FERRITIN, TIBC, IRON, RETICCTPCT in the last 72 hours. Urine analysis:    Component Value Date/Time   COLORURINE YELLOW 05/13/2011 2303   APPEARANCEUR CLEAR 05/13/2011 2303   LABSPEC 1.025 10/14/2011 1910   PHURINE 5.5 10/14/2011 1910   GLUCOSEU NEGATIVE 10/14/2011 1910   HGBUR NEGATIVE 10/14/2011 1910   BILIRUBINUR negative 12/01/2018 0955   KETONESUR NEGATIVE 10/14/2011 1910   PROTEINUR Negative 12/01/2018 0955   PROTEINUR NEGATIVE 10/14/2011 1910   UROBILINOGEN 0.2 12/01/2018 0955   UROBILINOGEN 0.2 10/14/2011 1910   NITRITE negative 12/01/2018 0955   NITRITE NEGATIVE 10/14/2011 1910   LEUKOCYTESUR Small (1+) (A) 12/01/2018 0955    Radiological Exams on Admission: CT Head Wo Contrast  Result Date: 06/25/2019 CLINICAL DATA:  Hypertensive headache. EXAM: CT HEAD WITHOUT CONTRAST TECHNIQUE: Contiguous axial images were obtained from the base of the skull through the vertex without intravenous contrast. COMPARISON:  02/23/2019 FINDINGS: Brain: There is no evidence for acute hemorrhage, hydrocephalus, mass lesion, or abnormal extra-axial fluid collection. No definite CT evidence for acute infarction. Diffuse loss of  parenchymal volume is consistent with atrophy. Patchy low attenuation in the deep hemispheric and periventricular white matter is nonspecific, but likely reflects chronic microvascular ischemic demyelination. Vascular: No hyperdense vessel or unexpected calcification. Skull: No evidence for fracture. No worrisome lytic or sclerotic lesion. Sinuses/Orbits: Hypoplastic right mastoid air cells. The visualized paranasal sinuses and mastoid air cells are clear. Visualized portions of the globes and intraorbital fat are unremarkable. Other: None. IMPRESSION: 1. No acute intracranial abnormality. 2. Atrophy with chronic  small vessel white matter ischemic disease. Electronically Signed   By: Misty Stanley M.D.   On: 06/25/2019 11:41   DG Chest Portable 1 View  Result Date: 06/25/2019 CLINICAL DATA:  86 year old female with chest pain since 0600 hours today. Symptoms improved with nitroglycerin. EXAM: PORTABLE CHEST 1 VIEW COMPARISON:  Chest radiographs 03/02/2019 and earlier. FINDINGS: Portable AP upright views at 0907 hours. Chronic pulmonary hyperinflation and cardiomegaly. Prior sternotomy. Chronic increased interstitial and perihilar opacity appears stable. No pneumothorax, pulmonary edema or pleural effusion identified. No acute osseous abnormality identified. Negative visible bowel gas pattern. IMPRESSION: Chronic lung disease with hyperinflation. No superimposed acute findings are identified. Electronically Signed   By: Genevie Ann M.D.   On: 06/25/2019 09:14   CT Angio Chest/Abd/Pel for Dissection W and/or Wo Contrast  Result Date: 06/25/2019 CLINICAL DATA:  Chest pain. EXAM: CT ANGIOGRAPHY CHEST, ABDOMEN AND PELVIS TECHNIQUE: Multidetector CT imaging through the chest, abdomen and pelvis was performed using the standard protocol during bolus administration of intravenous contrast. Multiplanar reconstructed images and MIPs were obtained and reviewed to evaluate the vascular anatomy. CONTRAST:  45m OMNIPAQUE  IOHEXOL 350 MG/ML SOLN COMPARISON:  March 11, 2011. FINDINGS: CTA CHEST FINDINGS Cardiovascular: Atherosclerosis of thoracic aorta is noted without aneurysm or dissection. Great vessels are widely patent. Normal cardiac size. No pericardial effusion. Status post aortic valve repair. Mediastinum/Nodes: No enlarged mediastinal, hilar, or axillary lymph nodes. Thyroid gland, trachea, and esophagus demonstrate no significant findings. Lungs/Pleura: No pneumothorax or pleural effusion is noted. Calcified pleural plaque is noted posteriorly in the left hemithorax consistent with asbestos exposure. 24 x 15 mm spiculated mass is noted in the right lower lobe best seen on image number 47 of series 6 consistent with malignancy. Musculoskeletal: No chest wall abnormality. No acute or significant osseous findings. Review of the MIP images confirms the above findings. CTA ABDOMEN AND PELVIS FINDINGS VASCULAR Aorta: Atherosclerosis of abdominal aorta is noted without aneurysm or dissection. Celiac: Patent without evidence of aneurysm, dissection, vasculitis or significant stenosis. SMA: Patent without evidence of aneurysm, dissection, vasculitis or significant stenosis. Renals: Right renal artery is widely patent without significant stenosis. Moderate focal stenosis is noted at the origin of the left renal artery with poststenotic dilatation. IMA: Patent without evidence of aneurysm, dissection, vasculitis or significant stenosis. Inflow: Moderate focal stenosis is noted at the origin of the left common iliac artery with poststenotic dilatation. Mild stenosis is noted at the origin of the right common iliac artery with mild poststenotic dilatation. External iliac arteries are widely patent. Veins: No obvious venous abnormality within the limitations of this arterial phase study. Review of the MIP images confirms the above findings. NON-VASCULAR Hepatobiliary: No focal liver abnormality is seen. Status post cholecystectomy. No  biliary dilatation. Pancreas: Unremarkable. No pancreatic ductal dilatation or surrounding inflammatory changes. Spleen: Normal in size without focal abnormality. Adrenals/Urinary Tract: Adrenal glands are unremarkable. Nonobstructive calculus is noted in lower pole collecting system of left kidney. Severe right hydronephrosis is noted without ureteral dilatation, concerning for ureteropelvic junction stenosis or obstruction. Urinary bladder is unremarkable. Stomach/Bowel: Stomach is within normal limits. Appendix appears normal. No evidence of bowel wall thickening, distention, or inflammatory changes. Lymphatic: No significant adenopathy is noted. Reproductive: Uterus and bilateral adnexa are unremarkable. Other: No abdominal wall hernia or abnormality. No abdominopelvic ascites. Musculoskeletal: No acute or significant osseous findings. Review of the MIP images confirms the above findings. IMPRESSION: No evidence of thoracic or abdominal aortic dissection or aneurysm. However, 2.4 cm spiculated  mass is noted in right lower lobe concerning for malignancy. PET scan is recommended for further evaluation. Moderate stenoses are seen involving the origins of the left renal and common iliac arteries. Mild stenosis is noted at origin of right external iliac artery. Nonobstructive left renal calculus is noted. Severe right hydronephrosis is noted without ureteral dilatation, concerning for ureteropelvic junction stenosis or obstruction. Calcified pleural plaque is noted posteriorly in left lower lobe consistent with asbestos exposure. Aortic Atherosclerosis (ICD10-I70.0). Electronically Signed   By: Marijo Conception M.D.   On: 06/25/2019 12:00    EKG: Independently reviewed. Sinuys rhythm, LVH, PVC's noted, no acute ST elevation.  Assessment/Plan Active Problems:   Chest pain   Chronic obstructive pulmonary disease (HCC)   Hypertension   Unintentional weight loss   Stage 3b chronic kidney disease   Chest pain  in adult  (please populate well all problems here in Problem List. (For example, if patient is on BP meds at home and you resume or decide to hold them, it is a problem that needs to be her. Same for CAD, COPD, HLD and so on)   1. Chest pain - patient with ASVD, AoVR with episode of retrosternal CP relieved by NTG. Initial Troponins negative. No acute EKG changes. Patient takes daily prednisone w/o GI protection Plan Observation/telemetry admit  Cycle cardiac enzymes  F/u EKG  Continue all cardiac medications  Cardiology has seen and will follow.  Add PPI  2. Pulm - advanced O2/steroid dependent COPD/emphysema. No evidence of exacerbation Plan Continue home meds - may use Stiolto Respirmat from home  Continue O2  3. Hypertension - BP OK in ED. Plan Continue home meds  4. Lung nodule - informed patient of spiculated lesion RLL. Plan No work up at this time, patient will follow up with Dr. Malvin Johns re: options of repeat        CT in 6-8 weeks vs PET scan and further work-up. She is rightly thoughtful about        "bang for the buck" issues.  DVT prophylaxis: full dose lovenox while r/o ACS (Lovenox/Heparin/SCD's/anticoagulated/None (if comfort care) Code Status: DNR (Full/Partial (specify details) Family Communication: spoke with son:  Explained Dx X plan, confirmed code status (Specify name, relationship. Do not write "discussed with patient". Specify tel # if discussed over the phone) Disposition Plan: home 24-48 hrs (specify when and where you expect patient to be discharged) Consults called: cardiology-Dr Bronson Ing (with names) Admission status: observation/telemetry (inpatient / obs / tele / medical floor / SDU)   Adella Hare MD [facility] Pager [phone]  If 7PM-7AM, please contact night-coverage [email or web address] Password [facility]  06/25/2019, 2:59 PM

## 2019-06-25 NOTE — ED Triage Notes (Signed)
Pt arrives to ED from home with complaints of centrailzed chest pain described as in digestion that's not going away starting at 6am this morning.  Pt called EMS and they gave patient x1 nitro and 324 aspirin, now patient CP is 0/10. Patient hypertensive this morning with blood pressure 220/100.

## 2019-06-25 NOTE — Progress Notes (Signed)
ANTICOAGULATION CONSULT NOTE - Initial Consult  Pharmacy Consult for heparin Indication: chest pain/ACS  Allergies  Allergen Reactions  . Clarithromycin     Mouth/throat sore  . Tamsulosin Itching  . Trazodone And Nefazodone     Dizziness   . Penicillins Itching and Rash    Patient Measurements:   Heparin Dosing Weight: TBW  Vital Signs: Temp: 98.9 F (37.2 C) (02/07 0832) Temp Source: Oral (02/07 0832) BP: 135/61 (02/07 1239) Pulse Rate: 66 (02/07 1239)  Labs: Recent Labs    06/25/19 0931 06/25/19 1143  HGB 13.9  --   HCT 43.8  --   PLT 193  --   CREATININE 1.32*  --   TROPONINIHS 11 21*    Estimated Creatinine Clearance: 24.7 mL/min (A) (by C-G formula based on SCr of 1.32 mg/dL (H)).   Medical History: Past Medical History:  Diagnosis Date  . Allergic rhinitis   . Aortic stenosis   . Carotid artery disease ([place])   . Colon polyps   . h/o Atrial fibrillation ([place]) 03/22/2011   Brief post op   . Hematuria 11/05/2015  . Hypercalcemia   . Hypertension   . Hypothyroid   . Osteopenia   . 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 Pressure urticaria   . RLS (restless legs syndrome)   . S/P aortic valve replacement and aortoplasty 04/13/2011   72m Medtronic Freestyle porcine aortic root   . S/P aortic valve replacement with stentless valve 03/18/2011   Assessment: 89YOF presenting with CP, hx of aortic bioprosthesis, bASA only PTA.  CBC wnl.    Goal of Therapy:  Heparin level 0.3-0.7 units/ml Monitor platelets by anticoagulation protocol: Yes   Plan:  Heparin 3000 units IV x1, and gtt at 600 units/hr F/u 8 hour heparin level  JBertis Ruddy PharmD Clinical Pharmacist Please check AMION for all [facility] [place] 06/25/2019 3:29 PM

## 2019-06-25 NOTE — Progress Notes (Signed)
  Echocardiogram 2D Echocardiogram has been performed.  [name] 06/25/2019, 5:51 PM

## 2019-06-25 NOTE — Consult Note (Signed)
Cardiology Consultation:   Patient ID: [name] MRN: [id number]; DOB: 08-Jun-1929  Admit date: 06/25/2019 Date of Consult: 06/25/2019  Primary Care Provider: Mellody Dance, DO Primary Cardiologist: Sinclair Grooms, MD  Primary Electrophysiologist:  None    Patient Profile:   [name] is a 86 y.o. female with a hx of hypertension, COPD, hyperlipidemia, postoperative atrial fibrillation, and aortic valve bioprosthesis who is being seen today for the evaluation of chest pain at the request of Dr. Langston Masker.  History of Present Illness:   [name] is a patient of Dr. Pernell Dupre.  She is an 86 year old woman with a history of hypertension, COPD, hyperlipidemia, and aortic valve bioprosthesis.  She was last evaluated in the office by Dr. Tamala Julian 3 days ago and at that time was doing well.  She wears oxygen around-the-clock.  His note mentions that exercise tolerance is limited by hypoxemia related to COPD.  She received her second dose of the COVID-19 vaccine on 06/22/2019.  Blood pressure at the time of the office visit was 142/78 with a heart rate of 77 bpm.  She awoke at 6 AM with a posterior headache and sharp, retrosternal chest pain. Denies radiation to back, jaw, and arms. Pain was constant. Thought she had "bad indigestion" initially. She checked BP at home three times. Initially, SBP was 280 mmHg and subsequently 220 mmHg.  She then called 911. EMS gave 1 nitroglycerin and four aspirin 81 mg tablets and chest pain completely resolved.  I personally reviewed the ECG which demonstrated sinus rhythm with PACs, PVCs, LVH, and consequent repolarization abnormalities.  Chest x-ray showed chronic lung disease with hyperinflation and no superimposed acute findings.  Head CT showed no acute intracranial abnormalities with atrophy with chronic small vessel white matter ischemic disease.  CT angiography of the chest and abdomen showed no evidence of thoracic or abdominal aortic  dissection or aneurysm.  There was moderate stenoses involving the origins of the left renal and common iliac arteries with mild stenosis at the origin of the right external iliac artery.  Severe right-sided hydronephrosis was noted with no evidence of ureteral dilatation.  Aortic atherosclerosis was also seen.  There is a 2.4 cm spiculated mass noted in the right lower lobe concerning for malignancy.  A PET scan has been recommended by radiology.  Pertinent labs include creatinine of 1.32.  First high-sensitivity troponin is normal.  CBC is normal.  TSH is normal.  She currently denies chest pain, palpitations, and shortness of breath. BP is normal. She is hungry and hoping to go home.   Heart Pathway Score:     Past Medical History:  Diagnosis Date  . Allergic rhinitis   . Aortic stenosis   . Carotid artery disease ([place])   . Colon polyps   . h/o Atrial fibrillation ([place]) 03/22/2011   Brief post op   . Hematuria 11/05/2015  . Hypercalcemia   . Hypertension   . Hypothyroid   . Osteopenia   . 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 Pressure urticaria   . RLS (restless legs syndrome)   . S/P aortic valve replacement and aortoplasty 04/13/2011   48m Medtronic Freestyle porcine aortic root   . S/P aortic valve replacement with stentless valve 03/18/2011    Past Surgical History:  Procedure Laterality Date  . AORTIC ROOT REPLACEMENT  03/18/2011   290mMedtronic Freestyle Porcine aortic root with reimplantation of coronary arteries  . BREAST LUMPECTOMY     Left  . CATARACT EXTRACTION    .  CHOLECYSTECTOMY    . FOOT SURGERY     Morton's neuroma  . KNEE ARTHROSCOPY     Left     Home Medications:  Prior to Admission medications   Medication Sig Start Date End Date Taking? Authorizing Provider  albuterol (PROAIR HFA) 108 (90 Base) MCG/ACT inhaler Inhale 2 puffs into the lungs every 6 (six) hours as needed for wheezing or shortness of breath. 10/21/16  Yes Collene Gobble, MD  amLODipine  (NORVASC) 5 MG tablet Take 1/2 (one-half) tablet by mouth once daily Patient taking differently: Take 2.5 mg by mouth daily.  04/21/19  Yes Opalski, Deborah, DO  aspirin 81 MG tablet Take 81 mg by mouth daily.   Yes [provider]  buPROPion (WELLBUTRIN) 75 MG tablet 1/2 tablet twice daily for 2 weeks then 1 full tablet twice daily Patient taking differently: Take 75 mg by mouth 2 (two) times daily.  02/28/19  Yes Opalski, Neoma Laming, DO  levothyroxine (SYNTHROID) 112 MCG tablet Take 1 tablet by mouth once daily 04/18/19  Yes Opalski, Neoma Laming, DO  Melatonin 10 MG TABS Take 10 mg by mouth at bedtime.    Yes [provider]  predniSONE (DELTASONE) 5 MG tablet Take 1 tablet by mouth once daily with breakfast Patient taking differently: Take 5 mg by mouth daily with breakfast.  04/18/19  Yes Opalski, Neoma Laming, DO  simvastatin (ZOCOR) 20 MG tablet Take 1 tablet (20 mg total) by mouth at bedtime. 04/26/19  Yes Opalski, Deborah, DO  STIOLTO RESPIMAT 2.5-2.5 MCG/ACT AERS INHALE 2 PUFFS BY MOUTH ONCE DAILY. PLEASE SEE DR FOR REFILLS Patient taking differently: Inhale 2 puffs into the lungs daily.  02/27/19   Collene Gobble, MD    Inpatient Medications: Scheduled Meds: . levothyroxine  112 mcg Oral Q0600   Continuous Infusions:  PRN Meds:   Allergies:    Allergies  Allergen Reactions  . Clarithromycin     Mouth/throat sore  . Tamsulosin Itching  . Trazodone And Nefazodone     Dizziness   . Penicillins Itching and Rash    Social History:   Social History   Socioeconomic History  . Marital status: Widowed    Spouse name: Not on file  . Number of children: N  . Years of education: Not on file  . Highest education level: Not on file  Occupational History  . Occupation: housewife  Tobacco Use  . Smoking status: Former Smoker    Packs/day: 0.90    Years: 62.00    Pack years: 55.80    Types: Cigarettes    Quit date: 07/14/2010    Years since quitting: 8.9  . Smokeless  tobacco: Never Used  Substance and Sexual Activity  . Alcohol use: No    Alcohol/week: 0.0 standard drinks  . Drug use: No  . Sexual activity: Never  Other Topics Concern  . Not on file  Social History Narrative  . Not on file   Social Determinants of Health   Financial Resource Strain:   . Difficulty of Paying Living Expenses: Not on file  Food Insecurity:   . Worried About Charity fundraiser in the Last Year: Not on file  . Ran Out of Food in the Last Year: Not on file  Transportation Needs:   . Lack of Transportation (Medical): Not on file  . Lack of Transportation (Non-Medical): Not on file  Physical Activity:   . Days of Exercise per Week: Not on file  . Minutes of Exercise per  Session: Not on file  Stress:   . Feeling of Stress : Not on file  Social Connections:   . Frequency of Communication with Friends and Family: Not on file  . Frequency of Social Gatherings with Friends and Family: Not on file  . Attends Religious Services: Not on file  . Active Member of Clubs or Organizations: Not on file  . Attends Archivist Meetings: Not on file  . Marital Status: Not on file  Intimate Partner Violence:   . Fear of Current or Ex-Partner: Not on file  . Emotionally Abused: Not on file  . Physically Abused: Not on file  . Sexually Abused: Not on file    Family History:    Family History  Problem Relation Age of Onset  . Cancer Mother        bladder  . Lung cancer Father   . Heart disease Maternal Grandmother   . Emphysema Maternal Aunt   . Asthma Maternal Aunt   . Asthma Maternal Uncle   . Breast cancer Sister   . Uterine cancer Sister      ROS:  Please see the history of present illness.   All other ROS reviewed and negative.     Physical Exam/Data:   Vitals:   06/25/19 1000 06/25/19 1030 06/25/19 1139 06/25/19 1200  BP: (!) 165/86 (!) 154/76 134/77 (!) 118/54  Pulse: (!) 109 83 (!) 41 (!) 37  Resp: (!) 24     Temp:      TempSrc:      SpO2:  98% 99% 98% 97%    Intake/Output Summary (Last 24 hours) at 06/25/2019 1212 Last data filed at 06/25/2019 1130 Gross per 24 hour  Intake 500 ml  Output --  Net 500 ml   Last 3 Weights 06/22/2019 04/06/2019 02/28/2019  Weight (lbs) 114 lb 12.8 oz 115 lb 4.8 oz 115 lb 8 oz  Weight (kg) 52.073 kg 52.3 kg 52.39 kg     There is no height or weight on file to calculate BMI.  General:  Well nourished, well developed, in no acute distress HEENT: normal Lymph: no adenopathy Neck: no JVD Endocrine:  No thryomegaly Cardiac:  normal S1, S2; RRR; soft systolic murmur over RUSB Lungs:  clear to auscultation bilaterally, no wheezing, rhonchi or rales  Abd: soft, nontender, no hepatomegaly  Ext: no edema Musculoskeletal:  No deformities, BUE and BLE strength normal and equal Skin: warm and dry  Neuro:  CNs 2-12 intact, no focal abnormalities noted Psych:  Normal affect   EKG:  The EKG was personally reviewed and demonstrates:  Reviewed above Telemetry:  Telemetry was personally reviewed and demonstrates:  Sinus rhythm  Relevant CV Studies:  Echocardiogram 02/26/2015:  - Left ventricle: Abnormal septal motion. The cavity size was  normal. Wall thickness was normal. Systolic function was normal.  The estimated ejection fraction was in the range of 55% to 60%.  Wall motion was normal; there were no regional wall motion  abnormalities. Doppler parameters are consistent with elevated  ventricular end-diastolic filling pressure.  - Aortic valve: Normal appearing tissue AVR. Valve area (VTI): 2.25  cm^2. Valve area (Vmean): 2.26 cm^2.  - Atrial septum: No defect or patent foramen ovale was identified.   Carotid Dopplers 03/09/2019:  1 to 39% bilateral carotid artery stenosis.   Cardiac catheterization 02/12/2011:  CONCLUSION: 1. Critical aortic stenosis, calcific. 2. Essentially widely patent coronary arteries with possible kink     versus atherosclerotic obstruction in the  distal LAD beyond the     second diagonal luminal irregularities noted in the proximal LAD     and proximal RCA. 3. Normal left ventricular function.  Laboratory Data:  High Sensitivity Troponin:   Recent Labs  Lab 06/25/19 0931  TROPONINIHS 11     Chemistry Recent Labs  Lab 06/25/19 0931  NA 140  K 4.4  CL 101  CO2 30  GLUCOSE 100*  BUN 20  CREATININE 1.32*  CALCIUM 9.9  GFRNONAA 36*  GFRAA 42*  ANIONGAP 9    Recent Labs  Lab 06/25/19 0931  PROT 6.7  ALBUMIN 3.0*  AST 22  ALT 16  ALKPHOS 46  BILITOT 0.5   Hematology Recent Labs  Lab 06/25/19 0931  WBC 9.2  RBC 4.40  HGB 13.9  HCT 43.8  MCV 99.5  MCH 31.6  MCHC 31.7  RDW 12.3  PLT 193   BNPNo results for input(s): BNP, PROBNP in the last 168 hours.  DDimer No results for input(s): DDIMER in the last 168 hours.   Radiology/Studies:  CT Head Wo Contrast  Result Date: 06/25/2019 CLINICAL DATA:  Hypertensive headache. EXAM: CT HEAD WITHOUT CONTRAST TECHNIQUE: Contiguous axial images were obtained from the base of the skull through the vertex without intravenous contrast. COMPARISON:  02/23/2019 FINDINGS: Brain: There is no evidence for acute hemorrhage, hydrocephalus, mass lesion, or abnormal extra-axial fluid collection. No definite CT evidence for acute infarction. Diffuse loss of parenchymal volume is consistent with atrophy. Patchy low attenuation in the deep hemispheric and periventricular white matter is nonspecific, but likely reflects chronic microvascular ischemic demyelination. Vascular: No hyperdense vessel or unexpected calcification. Skull: No evidence for fracture. No worrisome lytic or sclerotic lesion. Sinuses/Orbits: Hypoplastic right mastoid air cells. The visualized paranasal sinuses and mastoid air cells are clear. Visualized portions of the globes and intraorbital fat are unremarkable. Other: None. IMPRESSION: 1. No acute intracranial abnormality. 2. Atrophy with chronic small vessel white  matter ischemic disease. Electronically Signed   By: Misty Stanley M.D.   On: 06/25/2019 11:41   DG Chest Portable 1 View  Result Date: 06/25/2019 CLINICAL DATA:  86 year old female with chest pain since 0600 hours today. Symptoms improved with nitroglycerin. EXAM: PORTABLE CHEST 1 VIEW COMPARISON:  Chest radiographs 03/02/2019 and earlier. FINDINGS: Portable AP upright views at 0907 hours. Chronic pulmonary hyperinflation and cardiomegaly. Prior sternotomy. Chronic increased interstitial and perihilar opacity appears stable. No pneumothorax, pulmonary edema or pleural effusion identified. No acute osseous abnormality identified. Negative visible bowel gas pattern. IMPRESSION: Chronic lung disease with hyperinflation. No superimposed acute findings are identified. Electronically Signed   By: Genevie Ann M.D.   On: 06/25/2019 09:14   CT Angio Chest/Abd/Pel for Dissection W and/or Wo Contrast  Result Date: 06/25/2019 CLINICAL DATA:  Chest pain. EXAM: CT ANGIOGRAPHY CHEST, ABDOMEN AND PELVIS TECHNIQUE: Multidetector CT imaging through the chest, abdomen and pelvis was performed using the standard protocol during bolus administration of intravenous contrast. Multiplanar reconstructed images and MIPs were obtained and reviewed to evaluate the vascular anatomy. CONTRAST:  80m OMNIPAQUE IOHEXOL 350 MG/ML SOLN COMPARISON:  March 11, 2011. FINDINGS: CTA CHEST FINDINGS Cardiovascular: Atherosclerosis of thoracic aorta is noted without aneurysm or dissection. Great vessels are widely patent. Normal cardiac size. No pericardial effusion. Status post aortic valve repair. Mediastinum/Nodes: No enlarged mediastinal, hilar, or axillary lymph nodes. Thyroid gland, trachea, and esophagus demonstrate no significant findings. Lungs/Pleura: No pneumothorax or pleural effusion is noted. Calcified pleural plaque is noted posteriorly in the left  hemithorax consistent with asbestos exposure. 24 x 15 mm spiculated mass is noted in the  right lower lobe best seen on image number 47 of series 6 consistent with malignancy. Musculoskeletal: No chest wall abnormality. No acute or significant osseous findings. Review of the MIP images confirms the above findings. CTA ABDOMEN AND PELVIS FINDINGS VASCULAR Aorta: Atherosclerosis of abdominal aorta is noted without aneurysm or dissection. Celiac: Patent without evidence of aneurysm, dissection, vasculitis or significant stenosis. SMA: Patent without evidence of aneurysm, dissection, vasculitis or significant stenosis. Renals: Right renal artery is widely patent without significant stenosis. Moderate focal stenosis is noted at the origin of the left renal artery with poststenotic dilatation. IMA: Patent without evidence of aneurysm, dissection, vasculitis or significant stenosis. Inflow: Moderate focal stenosis is noted at the origin of the left common iliac artery with poststenotic dilatation. Mild stenosis is noted at the origin of the right common iliac artery with mild poststenotic dilatation. External iliac arteries are widely patent. Veins: No obvious venous abnormality within the limitations of this arterial phase study. Review of the MIP images confirms the above findings. NON-VASCULAR Hepatobiliary: No focal liver abnormality is seen. Status post cholecystectomy. No biliary dilatation. Pancreas: Unremarkable. No pancreatic ductal dilatation or surrounding inflammatory changes. Spleen: Normal in size without focal abnormality. Adrenals/Urinary Tract: Adrenal glands are unremarkable. Nonobstructive calculus is noted in lower pole collecting system of left kidney. Severe right hydronephrosis is noted without ureteral dilatation, concerning for ureteropelvic junction stenosis or obstruction. Urinary bladder is unremarkable. Stomach/Bowel: Stomach is within normal limits. Appendix appears normal. No evidence of bowel wall thickening, distention, or inflammatory changes. Lymphatic: No significant  adenopathy is noted. Reproductive: Uterus and bilateral adnexa are unremarkable. Other: No abdominal wall hernia or abnormality. No abdominopelvic ascites. Musculoskeletal: No acute or significant osseous findings. Review of the MIP images confirms the above findings. IMPRESSION: No evidence of thoracic or abdominal aortic dissection or aneurysm. However, 2.4 cm spiculated mass is noted in right lower lobe concerning for malignancy. PET scan is recommended for further evaluation. Moderate stenoses are seen involving the origins of the left renal and common iliac arteries. Mild stenosis is noted at origin of right external iliac artery. Nonobstructive left renal calculus is noted. Severe right hydronephrosis is noted without ureteral dilatation, concerning for ureteropelvic junction stenosis or obstruction. Calcified pleural plaque is noted posteriorly in left lower lobe consistent with asbestos exposure. Aortic Atherosclerosis (ICD10-I70.0). Electronically Signed   By: Marijo Conception M.D.   On: 06/25/2019 12:00         Assessment and Plan:   1.  Chest pain/hypertensive urgency: BP noted to be 220/100 at the time of presentation to the ED.  After 1 aspirin and 1 nitroglycerin symptoms have resolved.  First high-sensitivity troponin is normal.  CT angiography of the chest shows no evidence of dissection or aneurysm.  There is a 2.4 cm spiculated mass in the right lower lobe concerning for malignancy with a PET scan being recommended by radiology.  ECG demonstrates LVH with repolarization abnormalities.  Will await second high-sensitivity troponin.  Widely patent coronary arteries by cardiac catheterization in September 2012.  I will order a 2-D echocardiogram with Doppler to evaluate cardiac structure, function, and regional wall motion. It is possible symptoms are related to severe hypertension.  Most recent BPs are normal, 118/54 and 135/61.  2.  COPD: Chronically on oxygen.  Symptoms are stable.  No  evidence of exacerbation.  3.  Status post bioprosthetic aortic valve replacement: I will  obtain an echocardiogram to evaluate valve function.  4.  Hyperlipidemia: Continue simvastatin 20 mg.  5.  Carotid artery disease: 1 to 39% bilateral internal carotid artery stenosis by Dopplers in October 2020.  On statin therapy.    For questions or updates, please contact [place] Please consult [email or web address] for contact info under     Signed, Kate Sable, MD  06/25/2019 12:12 PM

## 2019-06-25 NOTE — ED Notes (Signed)
Patient's family wanted to leave contact number to be updated when possible (randy, patient's son [phone]).

## 2019-06-25 NOTE — ED Provider Notes (Signed)
[facility] EMERGENCY DEPARTMENT Provider Note   CSN: [id number] Arrival date & time: 06/25/19  1749    History Chief Complaint  Patient presents with  . Chest Pain    [name] is a 86 y.o. female history of aortic valve replacement on baby aspirin, hypothyroidism on Synthroid, aortic stenosis, presenting to emergency department with epigastric pain and chest pain.  She reports that she woke up around 6 AM with a mild posterior headache as well as a feeling of indigestion in her epigastrium.  She says she has had a similar sensation of "indigestion" in the past but never this intense.  She reports she checked her blood pressure at home and it was 280/100 mm hg, then laid down and rechecked it again, and it was 220/100 mm hg.  She normally runs 449-675 mm hg systolic, and is only on 5 mg amlodipine daily (which she did not yet take this morning).  She called EMS who arrived at her house and gave her full dose aspirin as well as 1 sublingual nitroglycerin.  She reports that her chest pain went away completely within 5 minutes of the sublingual nitroglycerin.  She also believes that her headache is improved although it was still there mildly in the back of her head.  She denies any history of MI or cardiac stents.  She does have a history of COPD and is on 2 L nasal cannula baseline.  She takes 5 mg prednisone daily as maintenance.    HPI     Past Medical History:  Diagnosis Date  . Allergic rhinitis   . Aortic stenosis   . Carotid artery disease ([place])   . Colon polyps   . h/o Atrial fibrillation ([place]) 03/22/2011   Brief post op   . Hematuria 11/05/2015  . Hypercalcemia   . Hypertension   . Hypothyroid   . Osteopenia   . 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 Pressure urticaria   . RLS (restless legs syndrome)   . S/P aortic valve replacement and aortoplasty 04/13/2011   37m Medtronic Freestyle porcine aortic root   . S/P aortic valve replacement with stentless  valve 03/18/2011    Patient Active Problem List   Diagnosis Date Noted  . Chest pain in adult 06/25/2019  . Hypertensive urgency   . History of aortic valve replacement   . Hyperlipidemia LDL goal <70   . New daily persistent headache-  now resolved 02/28/2019  . Stage 3b chronic kidney disease 02/28/2019  . Prediabetes 02/28/2019  . History of iron deficiency anemia 02/28/2019  . Urinary frequency 02/28/2019  . Dysthymia 02/28/2019  . Unintentional weight loss 02/09/2019  . Chronic respiratory failure with hypoxia [place] 02/09/2019  . Insomnia 04/28/2018  . Iron deficiency anemia 04/28/2018  . Estrogen deficiency 04/28/2018  . H. pylori infection w/ GERD sx 10/19/2017  . Essential hypertension 10/19/2017  . Medication monitoring encounter 07/07/2017  . Family history of breast cancer in sister 07/07/2017  . Family history of bladder cancer-in mother 07/07/2017  . Osteopenia after menopause 07/07/2017  . Chest pain 07/07/2017  . History of smoking   ( 43 yrs at 0.5ppd) ;1991- until 2012 ( 8 cig/day )   02/09/2017  . Elevated hemoglobin A1c 11/13/2015  . Hematuria 11/05/2015  . Hypothyroidism; on synthroid 11/05/2015  . Osteopenia 11/05/2015  . h/o Hypercalcemia 11/05/2015  . h/o RLS (restless legs syndrome) 11/05/2015  . h/o Intention tremor  11/05/2015  . History of nephrolithiasis 11/05/2015  .  S/P aortic valve replacement and aortoplasty 04/13/2011  . h/o Atrial fibrillation ([place]) 03/22/2011  . S/P aortic valve replacement with stentless valve 03/18/2011  . Carotid artery disease ([place])   . Hypertension   . Chronic obstructive pulmonary disease ([place]) 03/02/2011    Past Surgical History:  Procedure Laterality Date  . AORTIC ROOT REPLACEMENT  03/18/2011   78m Medtronic Freestyle Porcine aortic root with reimplantation of coronary arteries  . BREAST LUMPECTOMY     Left  . CATARACT EXTRACTION    . CHOLECYSTECTOMY    . FOOT SURGERY     Morton's neuroma  . KNEE  ARTHROSCOPY     Left     OB History   No obstetric history on file.     Family History  Problem Relation Age of Onset  . Cancer Mother        bladder  . Lung cancer Father   . Heart disease Maternal Grandmother   . Emphysema Maternal Aunt   . Asthma Maternal Aunt   . Asthma Maternal Uncle   . Breast cancer Sister   . Uterine cancer Sister     Social History   Tobacco Use  . Smoking status: Former Smoker    Packs/day: 0.90    Years: 62.00    Pack years: 55.80    Types: Cigarettes    Quit date: 07/14/2010    Years since quitting: 8.9  . Smokeless tobacco: Never Used  Substance Use Topics  . Alcohol use: No    Alcohol/week: 0.0 standard drinks  . Drug use: No    Home Medications Prior to Admission medications   Medication Sig Start Date End Date Taking? Authorizing Provider  albuterol (PROAIR HFA) 108 (90 Base) MCG/ACT inhaler Inhale 2 puffs into the lungs every 6 (six) hours as needed for wheezing or shortness of breath. 10/21/16  Yes BCollene Gobble MD  amLODipine (NORVASC) 5 MG tablet Take 1/2 (one-half) tablet by mouth once daily Patient taking differently: Take 2.5 mg by mouth daily.  04/21/19  Yes Opalski, Deborah, DO  aspirin 81 MG tablet Take 81 mg by mouth daily.   Yes [provider]  buPROPion (WELLBUTRIN) 75 MG tablet 1/2 tablet twice daily for 2 weeks then 1 full tablet twice daily Patient taking differently: Take 75 mg by mouth 2 (two) times daily.  02/28/19  Yes Opalski, DNeoma Laming DO  levothyroxine (SYNTHROID) 112 MCG tablet Take 1 tablet by mouth once daily 04/18/19  Yes Opalski, DNeoma Laming DO  Melatonin 10 MG TABS Take 10 mg by mouth at bedtime.    Yes [provider]  predniSONE (DELTASONE) 5 MG tablet Take 1 tablet by mouth once daily with breakfast Patient taking differently: Take 5 mg by mouth daily with breakfast.  04/18/19  Yes Opalski, DNeoma Laming DO  simvastatin (ZOCOR) 20 MG tablet Take 1 tablet (20 mg total) by mouth at bedtime.  04/26/19  Yes Opalski, Deborah, DO  STIOLTO RESPIMAT 2.5-2.5 MCG/ACT AERS INHALE 2 PUFFS BY MOUTH ONCE DAILY. PLEASE SEE DR FOR REFILLS Patient taking differently: Inhale 2 puffs into the lungs daily.  02/27/19   BCollene Gobble MD    Allergies    Clarithromycin, Tamsulosin, Trazodone and nefazodone, and Penicillins  Review of Systems   Review of Systems  Constitutional: Negative for chills and fever.  Eyes: Negative for photophobia and visual disturbance.  Respiratory: Negative for cough and shortness of breath.   Cardiovascular: Positive for chest pain. Negative for palpitations.  Gastrointestinal: Positive for abdominal  pain. Negative for vomiting.  Genitourinary: Negative for dysuria and flank pain.  Musculoskeletal: Negative for back pain and myalgias.  Skin: Negative for color change and rash.  Neurological: Positive for light-headedness and headaches. Negative for seizures, syncope, speech difficulty, weakness and numbness.  All other systems reviewed and are negative.   Physical Exam Updated Vital Signs BP (!) 154/72 (BP Location: Right Arm)   Pulse 77   Temp 98.4 F (36.9 C) (Oral)   Resp 18   SpO2 96%   Physical Exam Vitals and nursing note reviewed.  Constitutional:      General: She is not in acute distress.    Appearance: She is well-developed.  HENT:     Head: Normocephalic and atraumatic.  Eyes:     Conjunctiva/sclera: Conjunctivae normal.  Cardiovascular:     Rate and Rhythm: Normal rate and regular rhythm.     Heart sounds: Murmur present. Systolic murmur present.  Pulmonary:     Effort: Pulmonary effort is normal. No respiratory distress.     Breath sounds: Normal breath sounds.  Abdominal:     Palpations: Abdomen is soft.     Tenderness: There is no abdominal tenderness.  Musculoskeletal:     Cervical back: Neck supple.  Skin:    General: Skin is warm and dry.  Neurological:     General: No focal deficit present.     Mental Status: She is  alert and oriented to person, place, and time.     Cranial Nerves: No cranial nerve deficit.     Motor: No weakness.     ED Results / Procedures / Treatments   Labs (all labs ordered are listed, but only abnormal results are displayed) Labs Reviewed  COMPREHENSIVE METABOLIC PANEL - Abnormal; Notable for the following components:      Result Value   Glucose, Bld 100 (*)    Creatinine, Ser 1.32 (*)    Albumin 3.0 (*)    GFR calc non Af Amer 36 (*)    GFR calc Af Amer 42 (*)    All other components within normal limits  TROPONIN I (HIGH SENSITIVITY) - Abnormal; Notable for the following components:   Troponin I (High Sensitivity) 21 (*)    All other components within normal limits  TROPONIN I (HIGH SENSITIVITY) - Abnormal; Notable for the following components:   Troponin I (High Sensitivity) 33 (*)    All other components within normal limits  TROPONIN I (HIGH SENSITIVITY) - Abnormal; Notable for the following components:   Troponin I (High Sensitivity) 30 (*)    All other components within normal limits  SARS CORONAVIRUS 2 (TAT 6-24 HRS)  LIPASE, BLOOD  CBC WITH DIFFERENTIAL/PLATELET  TSH  CK TOTAL AND CKMB (NOT AT [facility])  HEPARIN LEVEL (UNFRACTIONATED)  CK TOTAL AND CKMB (NOT AT [facility])  CBC  TROPONIN I (HIGH SENSITIVITY)    EKG EKG Interpretation  Date/Time:  Sunday June 25 2019 08:35:15 EST Ventricular Rate:  80 PR Interval:    QRS Duration: 80 QT Interval:  359 QTC Calculation: 415 R Axis:   54 Text Interpretation: Sinus rhythm Multiple premature complexes, vent & supraven Biatrial enlargement RSR' in V1 or V2, probably normal variant LVH with secondary repolarization abnormality No STEMI Confirmed by Octaviano Glow [phone]) on 06/25/2019 10:07:11 AM   Radiology CT Head Wo Contrast  Result Date: 06/25/2019 CLINICAL DATA:  Hypertensive headache. EXAM: CT HEAD WITHOUT CONTRAST TECHNIQUE: Contiguous axial images were obtained from the base of the skull through the  vertex without intravenous contrast. COMPARISON:  02/23/2019 FINDINGS: Brain: There is no evidence for acute hemorrhage, hydrocephalus, mass lesion, or abnormal extra-axial fluid collection. No definite CT evidence for acute infarction. Diffuse loss of parenchymal volume is consistent with atrophy. Patchy low attenuation in the deep hemispheric and periventricular white matter is nonspecific, but likely reflects chronic microvascular ischemic demyelination. Vascular: No hyperdense vessel or unexpected calcification. Skull: No evidence for fracture. No worrisome lytic or sclerotic lesion. Sinuses/Orbits: Hypoplastic right mastoid air cells. The visualized paranasal sinuses and mastoid air cells are clear. Visualized portions of the globes and intraorbital fat are unremarkable. Other: None. IMPRESSION: 1. No acute intracranial abnormality. 2. Atrophy with chronic small vessel white matter ischemic disease. Electronically Signed   By: Misty Stanley M.D.   On: 06/25/2019 11:41   DG Chest Portable 1 View  Result Date: 06/25/2019 CLINICAL DATA:  86 year old female with chest pain since 0600 hours today. Symptoms improved with nitroglycerin. EXAM: PORTABLE CHEST 1 VIEW COMPARISON:  Chest radiographs 03/02/2019 and earlier. FINDINGS: Portable AP upright views at 0907 hours. Chronic pulmonary hyperinflation and cardiomegaly. Prior sternotomy. Chronic increased interstitial and perihilar opacity appears stable. No pneumothorax, pulmonary edema or pleural effusion identified. No acute osseous abnormality identified. Negative visible bowel gas pattern. IMPRESSION: Chronic lung disease with hyperinflation. No superimposed acute findings are identified. Electronically Signed   By: Genevie Ann M.D.   On: 06/25/2019 09:14   CT Angio Chest/Abd/Pel for Dissection W and/or Wo Contrast  Result Date: 06/25/2019 CLINICAL DATA:  Chest pain. EXAM: CT ANGIOGRAPHY CHEST, ABDOMEN AND PELVIS TECHNIQUE: Multidetector CT imaging through the  chest, abdomen and pelvis was performed using the standard protocol during bolus administration of intravenous contrast. Multiplanar reconstructed images and MIPs were obtained and reviewed to evaluate the vascular anatomy. CONTRAST:  53m OMNIPAQUE IOHEXOL 350 MG/ML SOLN COMPARISON:  March 11, 2011. FINDINGS: CTA CHEST FINDINGS Cardiovascular: Atherosclerosis of thoracic aorta is noted without aneurysm or dissection. Great vessels are widely patent. Normal cardiac size. No pericardial effusion. Status post aortic valve repair. Mediastinum/Nodes: No enlarged mediastinal, hilar, or axillary lymph nodes. Thyroid gland, trachea, and esophagus demonstrate no significant findings. Lungs/Pleura: No pneumothorax or pleural effusion is noted. Calcified pleural plaque is noted posteriorly in the left hemithorax consistent with asbestos exposure. 24 x 15 mm spiculated mass is noted in the right lower lobe best seen on image number 47 of series 6 consistent with malignancy. Musculoskeletal: No chest wall abnormality. No acute or significant osseous findings. Review of the MIP images confirms the above findings. CTA ABDOMEN AND PELVIS FINDINGS VASCULAR Aorta: Atherosclerosis of abdominal aorta is noted without aneurysm or dissection. Celiac: Patent without evidence of aneurysm, dissection, vasculitis or significant stenosis. SMA: Patent without evidence of aneurysm, dissection, vasculitis or significant stenosis. Renals: Right renal artery is widely patent without significant stenosis. Moderate focal stenosis is noted at the origin of the left renal artery with poststenotic dilatation. IMA: Patent without evidence of aneurysm, dissection, vasculitis or significant stenosis. Inflow: Moderate focal stenosis is noted at the origin of the left common iliac artery with poststenotic dilatation. Mild stenosis is noted at the origin of the right common iliac artery with mild poststenotic dilatation. External iliac arteries are widely  patent. Veins: No obvious venous abnormality within the limitations of this arterial phase study. Review of the MIP images confirms the above findings. NON-VASCULAR Hepatobiliary: No focal liver abnormality is seen. Status post cholecystectomy. No biliary dilatation. Pancreas: Unremarkable. No pancreatic ductal dilatation or surrounding inflammatory changes. Spleen:  Normal in size without focal abnormality. Adrenals/Urinary Tract: Adrenal glands are unremarkable. Nonobstructive calculus is noted in lower pole collecting system of left kidney. Severe right hydronephrosis is noted without ureteral dilatation, concerning for ureteropelvic junction stenosis or obstruction. Urinary bladder is unremarkable. Stomach/Bowel: Stomach is within normal limits. Appendix appears normal. No evidence of bowel wall thickening, distention, or inflammatory changes. Lymphatic: No significant adenopathy is noted. Reproductive: Uterus and bilateral adnexa are unremarkable. Other: No abdominal wall hernia or abnormality. No abdominopelvic ascites. Musculoskeletal: No acute or significant osseous findings. Review of the MIP images confirms the above findings. IMPRESSION: No evidence of thoracic or abdominal aortic dissection or aneurysm. However, 2.4 cm spiculated mass is noted in right lower lobe concerning for malignancy. PET scan is recommended for further evaluation. Moderate stenoses are seen involving the origins of the left renal and common iliac arteries. Mild stenosis is noted at origin of right external iliac artery. Nonobstructive left renal calculus is noted. Severe right hydronephrosis is noted without ureteral dilatation, concerning for ureteropelvic junction stenosis or obstruction. Calcified pleural plaque is noted posteriorly in left lower lobe consistent with asbestos exposure. Aortic Atherosclerosis (ICD10-I70.0). Electronically Signed   By: Marijo Conception M.D.   On: 06/25/2019 12:00    Procedures .Critical  Care Performed by: Wyvonnia Dusky, MD Authorized by: Wyvonnia Dusky, MD   Critical care provider statement:    Critical care time (minutes):  40   Critical care was necessary to treat or prevent imminent or life-threatening deterioration of the following conditions:  Circulatory failure   Critical care was time spent personally by me on the following activities:  Discussions with consultants, evaluation of patient's response to treatment, examination of patient, ordering and performing treatments and interventions, ordering and review of laboratory studies, ordering and review of radiographic studies, pulse oximetry, re-evaluation of patient's condition, obtaining history from patient or surrogate and review of old charts Comments:     Hypertensive urgency requiring IV blood pressure medications, discussion with cardiology specialist, ecg interpretation, frequent bedside reassessments   (including critical care time)  Medications Ordered in ED Medications  levothyroxine (SYNTHROID) tablet 112 mcg (112 mcg Oral Given 06/25/19 0930)  aspirin EC tablet 81 mg (81 mg Oral Not Given 06/25/19 1515)  amLODipine (NORVASC) tablet 2.5 mg (2.5 mg Oral Not Given 06/25/19 1516)  simvastatin (ZOCOR) tablet 20 mg (has no administration in time range)  buPROPion (WELLBUTRIN) tablet 75 mg (75 mg Oral Given 06/25/19 1515)  predniSONE (DELTASONE) tablet 5 mg (has no administration in time range)  Melatonin TABS 9 mg (has no administration in time range)  acetaminophen (TYLENOL) tablet 650 mg (has no administration in time range)  ondansetron (ZOFRAN) injection 4 mg (has no administration in time range)  dextrose 5 %-0.45 % sodium chloride infusion ( Intravenous New Bag/Given 06/25/19 1736)  pantoprazole (PROTONIX) EC tablet 40 mg (40 mg Oral Given 06/25/19 1515)  heparin ADULT infusion 100 units/mL (25000 units/258m sodium chloride 0.45%) (600 Units/hr Intravenous New Bag/Given 06/25/19 1654)  albuterol (PROVENTIL)  (2.5 MG/3ML) 0.083% nebulizer solution 2.5 mg (has no administration in time range)  amLODipine (NORVASC) tablet 5 mg (5 mg Oral Given 06/25/19 0930)  predniSONE (DELTASONE) tablet 5 mg (5 mg Oral Given 06/25/19 1042)  acetaminophen (TYLENOL) tablet 650 mg (650 mg Oral Given 06/25/19 1040)  labetalol (NORMODYNE) injection 10 mg (10 mg Intravenous Given 06/25/19 1041)  sodium chloride 0.9 % bolus 500 mL (0 mLs Intravenous Stopped 06/25/19 1130)  iohexol (OMNIPAQUE) 350 MG/ML  injection 75 mL (75 mLs Intravenous Contrast Given 06/25/19 1133)  heparin bolus via infusion 3,000 Units (3,000 Units Intravenous Bolus from Bag 06/25/19 1654)    ED Course  I have reviewed the triage vital signs and the nursing notes.  Pertinent labs & imaging results that were available during my care of the patient were reviewed by me and considered in my medical decision making (see chart for details).  86 yo female here with chest/epigastric pain and lightheadedness that began with awakening today at 0600.  Reporting significantly elevated BP at home.  Improvement with SL nitro.    Dissection study here shows no acute vascular injury, some noted calcifications, but overall I do not suspect her symptoms are related to dissection.  ECG was unremarkable, but delta trops increased from 11-> 21, raising concern for a minor cardiac stress event.  This may be have been issue with malignant hypertension if her SBP was truly 220 mmhg at home and she normally runs 120-150 mmhg.  Appreciate cardiology recommendations.  She will be admitted for further cardiac w/u and monitoring.  No signs or symptoms of stroke.  CTH with no acute intracranial pathologies.  I do not believe she is active hypertensive crisis or emergency, but with her reportedly persistent headache, I did attempt to lower her BP here with norvasc as well as IV labetalol.  Her headache did not improve after medications, which suggests this headache was likely not caused by her  hypertension.  Likely tension-type headache.  CT demonstrated incidental findings including right sided hydronephrosis of the kidney and possible stricture of the right ureter.  I discussed this with Dr Carolin Sicks of nephrology and Dr Louis Meckel of urology, neither of whom recommended any urgent intervention.  The patient's Cr was near baseline, and she may have had these findings for quite some time.  There were no signs or symptoms of infection.  She has outpatient nephrology appointment pending and outpatient renal ultrasound pending, and can follow up for this issue as an outpatient.  Clinical Course as of Jun 25 1751  Sun Jun 25, 2019  1030 BP now 165/86, patient still having mild headache   [MT]  1030 Needs 2nd trop, will add tylenol for headache, obtain CTH and dissection study   [MT]  1110 Cardiology to see   [MT]  1228 IMPRESSION: No evidence of thoracic or abdominal aortic dissection or aneurysm.  However, 2.4 cm spiculated mass is noted in right lower lobe concerning for malignancy. PET scan is recommended for further evaluation.  Moderate stenoses are seen involving the origins of the left renal and common iliac arteries. Mild stenosis is noted at origin of right external iliac artery.  Nonobstructive left renal calculus is noted.  Severe right hydronephrosis is noted without ureteral dilatation, concerning for ureteropelvic junction stenosis or obstruction.  Calcified pleural plaque is noted posteriorly in left lower lobe consistent with asbestos exposure.   [MT]  1233 HR 68, not in the 30's   [MT]  3785 I suspect her ureteral stricture is a chronic finding and unlikely the cause of her pain, it does not correlate with the location or description of her pain   [MT]  1318 Troponin I (High Sensitivity)(!): 21 [MT]  1408 Signout given to hospitalist.  Patient informed of lab results, agreeable to staying for further cardiac monitoring and w/u   [MT]    Clinical Course  User Index [MT] Sircharles Holzheimer, Carola Rhine, MD   MFinal Clinical Impression(s) / ED Diagnoses Final diagnoses:  Chest pain, unspecified type    Rx / DC Orders ED Discharge Orders    None       Caedan Sumler, Carola Rhine, MD 06/25/19 1754

## 2019-06-26 ENCOUNTER — Telehealth: Payer: Self-pay | Admitting: Pulmonary Disease

## 2019-06-26 DIAGNOSIS — I10 Essential (primary) hypertension: Secondary | ICD-10-CM | POA: Diagnosis not present

## 2019-06-26 DIAGNOSIS — N1832 Chronic kidney disease, stage 3b: Secondary | ICD-10-CM | POA: Diagnosis not present

## 2019-06-26 DIAGNOSIS — I208 Other forms of angina pectoris: Secondary | ICD-10-CM

## 2019-06-26 DIAGNOSIS — J432 Centrilobular emphysema: Secondary | ICD-10-CM | POA: Diagnosis not present

## 2019-06-26 DIAGNOSIS — N1330 Unspecified hydronephrosis: Secondary | ICD-10-CM | POA: Diagnosis present

## 2019-06-26 LAB — HEPARIN LEVEL (UNFRACTIONATED): Heparin Unfractionated: 0.13 IU/mL — ABNORMAL LOW (ref 0.30–0.70)

## 2019-06-26 LAB — CK TOTAL AND CKMB (NOT AT ARMC)
CK, MB: 4.5 ng/mL (ref 0.5–5.0)
Relative Index: 3.4 — ABNORMAL HIGH (ref 0.0–2.5)
Total CK: 133 U/L (ref 38–234)

## 2019-06-26 LAB — CBC
HCT: 40.4 % (ref 36.0–46.0)
Hemoglobin: 12.8 g/dL (ref 12.0–15.0)
MCH: 31.6 pg (ref 26.0–34.0)
MCHC: 31.7 g/dL (ref 30.0–36.0)
MCV: 99.8 fL (ref 80.0–100.0)
Platelets: 197 10*3/uL (ref 150–400)
RBC: 4.05 MIL/uL (ref 3.87–5.11)
RDW: 12.5 % (ref 11.5–15.5)
WBC: 6.7 10*3/uL (ref 4.0–10.5)
nRBC: 0 % (ref 0.0–0.2)

## 2019-06-26 MED ORDER — ROSUVASTATIN CALCIUM 20 MG PO TABS: 20.0000 mg | ORAL_TABLET | Freq: Every day | ORAL | Status: DC

## 2019-06-26 MED ORDER — AMLODIPINE BESYLATE 2.5 MG PO TABS: 2.5000 mg | ORAL_TABLET | Freq: Every day | ORAL | 1 refills | Status: DC

## 2019-06-26 MED ORDER — ENOXAPARIN SODIUM 30 MG/0.3ML ~~LOC~~ SOLN: 30.0000 mg | SUBCUTANEOUS | Status: DC

## 2019-06-26 MED ORDER — HEPARIN BOLUS VIA INFUSION
1500.0000 [IU] | Freq: Once | INTRAVENOUS | Status: AC
  Administered 2019-06-26: 1500 [IU] via INTRAVENOUS
  Filled 2019-06-26: qty 1500

## 2019-06-26 MED ORDER — ISOSORBIDE MONONITRATE ER 30 MG PO TB24
15.0000 mg | ORAL_TABLET | Freq: Every day | ORAL | Status: DC
  Administered 2019-06-26: 15 mg via ORAL
  Filled 2019-06-26: qty 1

## 2019-06-26 MED ORDER — PANTOPRAZOLE SODIUM 40 MG PO TBEC: 40.0000 mg | DELAYED_RELEASE_TABLET | Freq: Two times a day (BID) | ORAL | 1 refills | Status: DC

## 2019-06-26 MED ORDER — ROSUVASTATIN CALCIUM 20 MG PO TABS: 20.0000 mg | ORAL_TABLET | Freq: Every day | ORAL | 1 refills | Status: DC

## 2019-06-26 MED ORDER — ISOSORBIDE MONONITRATE ER 30 MG PO TB24: 15.0000 mg | ORAL_TABLET | Freq: Every day | ORAL | 1 refills | Status: DC

## 2019-06-26 NOTE — Telephone Encounter (Signed)
Called by [facility] NP regarding incidental finding of RLL spiculated 2.4 cm pulmonary mass. Patient is followed by Dr. Lamonte Sakai for COPD.  Patient seen for other reasons in ER (chest pain) and is being discharged by [facility].  Per [facility], patient may not want to pursue testing.  Follow up arranged for pulmonary mass evaluation.   Appt with Dr. Baltazar Apo on 2/19 at 3:15 PM.  Patient to arrive at 3:00 for check in.      Noe Gens, MSN, NP-C [facility] 06/26/2019, 1:17 PM   Please see [email or web address] for pager details.

## 2019-06-26 NOTE — Progress Notes (Signed)
[place] for heparin Indication: chest pain/ACS  Allergies  Allergen Reactions  . Clarithromycin     Mouth/throat sore  . Tamsulosin Itching  . Trazodone And Nefazodone     Dizziness   . Penicillins Itching and Rash    Patient Measurements:   Heparin Dosing Weight: TBW  Vital Signs: Temp: 98.8 F (37.1 C) (02/07 2025) Temp Source: Oral (02/07 2025) BP: 168/74 (02/07 2025) Pulse Rate: 64 (02/07 2025)  Labs: Recent Labs    06/25/19 0931 06/25/19 0931 06/25/19 1143 06/25/19 1509 06/25/19 1627 06/26/19 0151  HGB 13.9  --   --   --   --  12.8  HCT 43.8  --   --   --   --  40.4  PLT 193  --   --   --   --  197  HEPARINUNFRC  --   --   --   --   --  0.13*  CREATININE 1.32*  --   --   --   --   --   CKTOTAL  --   --   --  81  --   --   CKMB  --   --   --  4.8  --   --   TROPONINIHS 11   < > 21* 33* 30*  --    < > = values in this interval not displayed.    Estimated Creatinine Clearance: 24.7 mL/min (A) (by C-G formula based on SCr of 1.32 mg/dL (H)).   Assessment: 79 YOF presenting with CP, hx of aortic bioprosthesis, bASA only PTA.  CBC wnl.    Heparin level subtherapeutic (0.13) on gtt at 600 units/hr. No issues with line or bleeding reported per RN.  Goal of Therapy:  Heparin level 0.3-0.7 units/ml Monitor platelets by anticoagulation protocol: Yes   Plan:  Rebolus heparin 1500 units Increase heparin to 750 units/hr F/u 8 hour heparin level  Sherlon Handing, PharmD, BCPS Please see amion for complete clinical pharmacist phone list 06/26/2019 3:52 AM

## 2019-06-26 NOTE — Care Management Obs Status (Signed)
[place] NOTIFICATION   Patient Details  Name: [name] MRN: [id number] Date of Birth: 14-Dec-1929   Medicare Observation Status Notification Given:  Yes    Zenon Mayo, RN 06/26/2019, 2:23 PM

## 2019-06-26 NOTE — Progress Notes (Addendum)
The patient has been seen in conjunction with Rosaria Ferries, PA-C. All aspects of care have been considered and discussed. The patient has been personally interviewed, examined, and all clinical data has been reviewed.   Mrs. Salim is well-known to me.  We are asked to see her because of an episode of chest pain.  She describes the chest pain as "indigestion" in her central chest from the xiphoid to the sternal notch.  She has never felt this pain before.  She has oxygen requiring COPD.  Serial EKGs and cardiac biomarkers are low risk.  Coronary calcification is noted on CT and generalized atherosclerosis is noted in the aorta.  CT demonstrates lung CA right lower lobe.   Recommend no evaluation of chest pain at this time as our treatment options are limited given her comorbidities.  Suggest incorporating anti-ischemic therapy in her medication regimen (aspirin, consider long-acting nitrate, and continue calcium channel blocker).  Preventive therapy for vascular events should also be considered such as high intensity statin therapy (consider switching from simvastatin 20 mg/day to rosuvastatin 20 or 40 mg daily).   Progress Note  Patient Name: Zada Girt Date of Encounter: 06/26/2019  Primary Cardiologist:  Sinclair Grooms, MD  Subjective   Breathing better, no more CP, no hx exertional cp, occasional fleeting cp  Inpatient Medications    Scheduled Meds: . amLODipine  2.5 mg Oral Daily  . aspirin EC  81 mg Oral Daily  . buPROPion  75 mg Oral BID  . levothyroxine  112 mcg Oral Q0600  . Melatonin  9 mg Oral QHS  . pantoprazole  40 mg Oral BID  . predniSONE  5 mg Oral Q breakfast  . simvastatin  20 mg Oral QHS   Continuous Infusions: . dextrose 5 % and 0.45% NaCl 10 mL/hr at 06/25/19 1736  . heparin 750 Units/hr (06/26/19 0401)   PRN Meds: acetaminophen, albuterol, ondansetron (ZOFRAN) IV   Vital Signs    Vitals:   06/25/19 1239 06/25/19 1603 06/25/19 2025 06/26/19  0531  BP: 135/61 (!) 154/72 (!) 168/74 (!) 151/70  Pulse: 66 77 64 67  Resp: (!) _0 Temp:  98.4 F (36.9 C) 98.8 F (37.1 C) 98 F (36.7 C)  TempSrc:  Oral Oral Oral  SpO2: 98% 96% 99% 100%  Weight:    50.9 kg    Intake/Output Summary (Last 24 hours) at 06/26/2019 2563 Last data filed at 06/26/2019 0600 Gross per 24 hour  Intake 988.57 ml  Output 350 ml  Net 638.57 ml   Filed Weights   06/26/19 0531  Weight: 50.9 kg   Last Weight  Most recent update: 06/26/2019  5:33 AM   Weight  50.9 kg (112 lb 4.8 oz)           Weight change:    Telemetry    SR, Sbrady high 50s, PVCs  - Personally Reviewed  ECG    None today - Personally Reviewed  Physical Exam   General: Well developed, well nourished, female appearing in no acute distress. Head: Normocephalic, atraumatic.  Neck: Supple without bruits, JVD not elevated. Lungs:  Resp regular and unlabored, decreased BS bases, no wheeze Heart: RRR, S1, S2, no S3, S4, or murmur; no rub. Abdomen: Soft, non-tender, non-distended with normoactive bowel sounds. No hepatomegaly. No rebound/guarding. No obvious abdominal masses. Extremities: No clubbing, cyanosis, no edema. Distal pedal pulses are 2+ bilaterally. Neuro: Alert and oriented X 3. Moves all extremities spontaneously.  Psych: Normal affect.  Labs    Hematology Recent Labs  Lab 06/25/19 0931 06/26/19 0151  WBC 9.2 6.7  RBC 4.40 4.05  HGB 13.9 12.8  HCT 43.8 40.4  MCV 99.5 99.8  MCH 31.6 31.6  MCHC 31.7 31.7  RDW 12.3 12.5  PLT 193 197    Chemistry Recent Labs  Lab 06/25/19 0931  NA 140  K 4.4  CL 101  CO2 30  GLUCOSE 100*  BUN 20  CREATININE 1.32*  CALCIUM 9.9  PROT 6.7  ALBUMIN 3.0*  AST 22  ALT 16  ALKPHOS 46  BILITOT 0.5  GFRNONAA 36*  GFRAA 42*  ANIONGAP 9     High Sensitivity Troponin:   Recent Labs  Lab 06/25/19 0931 06/25/19 1143 06/25/19 1509 06/25/19 1627  TROPONINIHS 11 21* 33* 30*      BNPNo results for input(s):  BNP, PROBNP in the last 168 hours.    Radiology    CT Head Wo Contrast  Result Date: 06/25/2019 CLINICAL DATA:  Hypertensive headache. EXAM: CT HEAD WITHOUT CONTRAST TECHNIQUE: Contiguous axial images were obtained from the base of the skull through the vertex without intravenous contrast. COMPARISON:  02/23/2019 FINDINGS: Brain: There is no evidence for acute hemorrhage, hydrocephalus, mass lesion, or abnormal extra-axial fluid collection. No definite CT evidence for acute infarction. Diffuse loss of parenchymal volume is consistent with atrophy. Patchy low attenuation in the deep hemispheric and periventricular white matter is nonspecific, but likely reflects chronic microvascular ischemic demyelination. Vascular: No hyperdense vessel or unexpected calcification. Skull: No evidence for fracture. No worrisome lytic or sclerotic lesion. Sinuses/Orbits: Hypoplastic right mastoid air cells. The visualized paranasal sinuses and mastoid air cells are clear. Visualized portions of the globes and intraorbital fat are unremarkable. Other: None. IMPRESSION: 1. No acute intracranial abnormality. 2. Atrophy with chronic small vessel white matter ischemic disease. Electronically Signed   By: Misty Stanley M.D.   On: 06/25/2019 11:41   DG Chest Portable 1 View  Result Date: 06/25/2019 CLINICAL DATA:  86 year old female with chest pain since 0600 hours today. Symptoms improved with nitroglycerin. EXAM: PORTABLE CHEST 1 VIEW COMPARISON:  Chest radiographs 03/02/2019 and earlier. FINDINGS: Portable AP upright views at 0907 hours. Chronic pulmonary hyperinflation and cardiomegaly. Prior sternotomy. Chronic increased interstitial and perihilar opacity appears stable. No pneumothorax, pulmonary edema or pleural effusion identified. No acute osseous abnormality identified. Negative visible bowel gas pattern. IMPRESSION: Chronic lung disease with hyperinflation. No superimposed acute findings are identified. Electronically  Signed   By: Genevie Ann M.D.   On: 06/25/2019 09:14   ECHOCARDIOGRAM COMPLETE  Result Date: 06/25/2019   ECHOCARDIOGRAM REPORT   Patient Name:   TAMSIN NADER Date of Exam: 06/25/2019 Medical Rec #:  [id number]     Height:       64.0 in Accession #:    [id number]    Weight:       114.8 lb Date of Birth:  1930/04/23    BSA:          1.55 m Patient Age:    29 years      BP:           154/72 mmHg Patient Gender: F             HR:           67 bpm. Exam Location:  Inpatient Procedure: 2D Echo Indications:    Chest pain 786.50/R07.9  History:        Patient has prior  history of Echocardiogram examinations, most                 recent 04/13/2017. COPD, Arrythmias:Atrial Flutter; Risk                 Factors:Current Smoker, Hypertension and Dyslipidemia. S/p AVR                 aoroplasty.  Sonographer:    Clayton Lefort RDCS (AE) Referring Phys: [id number] [place]  1. Left ventricular ejection fraction, by visual estimation, is 60 to 65%. The left ventricle has normal function. There is moderately increased left ventricular hypertrophy.  2. Left ventricular diastolic parameters are indeterminate.  3. Global right ventricle has normal systolic function.The right ventricular size is normal.  4. Left atrial size was normal.  5. Right atrial size was mildly dilated.  6. Moderate thickening of the mitral valve leaflet(s).  7. The mitral valve is normal in structure. Trivial mitral valve regurgitation.  8. The tricuspid valve is normal in structure. Tricuspid valve regurgitation is trivial.  9. Bioprosthetic aortic valve with normal function. Aortic valve regurgitation is not visualized. Aortic valve mean gradient measures 3 mmHg 10. The pulmonic valve was not well visualized. Pulmonic valve regurgitation is not visualized. 11. The inferior vena cava is normal in size with greater than 50% respiratory variability, suggesting right atrial pressure of 3 mmHg. 12. The tricuspid regurgitant velocity is 2.59 m/s, and  with an assumed right atrial pressure of 3 mmHg, the estimated right ventricular systolic pressure is normal at 29.8 mmHg. FINDINGS  Left Ventricle: Left ventricular ejection fraction, by visual estimation, is 60 to 65%. The left ventricle has normal function. The left ventricle has no regional wall motion abnormalities. There is moderately increased left ventricular hypertrophy. Left ventricular diastolic parameters are indeterminate. Right Ventricle: The right ventricular size is normal. No increase in right ventricular wall thickness. Global RV systolic function is has normal systolic function. The tricuspid regurgitant velocity is 2.59 m/s, and with an assumed right atrial pressure  of 3 mmHg, the estimated right ventricular systolic pressure is normal at 29.8 mmHg. Left Atrium: Left atrial size was normal in size. Right Atrium: Right atrial size was mildly dilated Prominent Chiari network. Pericardium: Trivial pericardial effusion is present. Mitral Valve: The mitral valve is normal in structure. There is moderate thickening of the mitral valve leaflet(s). Trivial mitral valve regurgitation. MV peak gradient, 8.9 mmHg. Tricuspid Valve: The tricuspid valve is normal in structure. Tricuspid valve regurgitation is trivial. Aortic Valve: The aortic valve has been repaired/replaced. Aortic valve regurgitation is not visualized. The aortic valve is structurally normal, with no evidence of sclerosis or stenosis. Aortic valve mean gradient measures 3.0 mmHg. Aortic valve peak gradient measures 5.1 mmHg. Bioprosthetic aortic valve valve is present in the aortic position. Pulmonic Valve: The pulmonic valve was not well visualized. Pulmonic valve regurgitation is not visualized. Pulmonic regurgitation is not visualized. Aorta: The aortic root is normal in size and structure. Venous: The inferior vena cava is normal in size with greater than 50% respiratory variability, suggesting right atrial pressure of 3 mmHg.  IAS/Shunts: The interatrial septum was not well visualized.  LEFT VENTRICLE PLAX 2D LVIDd:         2.40 cm Diastology LVIDs:         1.80 cm LV e' lateral:   5.00 cm/s LV PW:         1.40 cm LV E/e' lateral: 24.4 LV IVS:  1.40 cm LV e' medial:    5.77 cm/s LV SV:         10 ml   LV E/e' medial:  21.1 LV SV Index:   6.82  RIGHT VENTRICLE             IVC RV Basal diam:  3.60 cm     IVC diam: 1.80 cm RV Mid diam:    2.60 cm RV S prime:     14.50 cm/s TAPSE (M-mode): 2.0 cm LEFT ATRIUM             Index       RIGHT ATRIUM           Index LA diam:        2.70 cm 1.75 cm/m  RA Area:     18.90 cm LA Vol (A2C):   47.3 ml 30.61 ml/m RA Volume:   56.70 ml  36.69 ml/m LA Vol (A4C):   38.2 ml 24.72 ml/m LA Biplane Vol: 45.3 ml 29.32 ml/m  AORTIC VALVE AV Vmax:           113.00 cm/s AV Vmean:          80.967 cm/s AV VTI:            0.274 m AV Peak Grad:      5.1 mmHg AV Mean Grad:      3.0 mmHg LVOT Vmax:         103.00 cm/s LVOT Vmean:        72.000 cm/s LVOT VTI:          0.313 m LVOT/AV VTI ratio: 1.14  AORTA Ao Root diam: 2.50 cm Ao Asc diam:  3.50 cm MITRAL VALVE                         TRICUSPID VALVE MV Area (PHT): 1.94 cm              TR Peak grad:   26.8 mmHg MV Peak grad:  8.9 mmHg              TR Vmax:        259.00 cm/s MV Mean grad:  2.0 mmHg MV Vmax:       1.49 m/s              SHUNTS MV Vmean:      66.4 cm/s             Systemic VTI: 0.31 m MV VTI:        0.44 m MV PHT:        113.39 msec MV Decel Time: 391 msec MV E velocity: 122.00 cm/s 103 cm/s MV A velocity: 154.00 cm/s 70.3 cm/s MV E/A ratio:  0.79        1.5  Oswaldo Milian MD Electronically signed by Oswaldo Milian MD Signature Date/Time: 06/25/2019/8:56:53 PM    Final    CT Angio Chest/Abd/Pel for Dissection W and/or Wo Contrast  Result Date: 06/25/2019 CLINICAL DATA:  Chest pain. EXAM: CT ANGIOGRAPHY CHEST, ABDOMEN AND PELVIS TECHNIQUE: Multidetector CT imaging through the chest, abdomen and pelvis was performed using the  standard protocol during bolus administration of intravenous contrast. Multiplanar reconstructed images and MIPs were obtained and reviewed to evaluate the vascular anatomy. CONTRAST:  57m OMNIPAQUE IOHEXOL 350 MG/ML SOLN COMPARISON:  March 11, 2011. FINDINGS: CTA CHEST FINDINGS Cardiovascular: Atherosclerosis of thoracic aorta is noted without aneurysm or dissection. Great vessels are  widely patent. Normal cardiac size. No pericardial effusion. Status post aortic valve repair. Mediastinum/Nodes: No enlarged mediastinal, hilar, or axillary lymph nodes. Thyroid gland, trachea, and esophagus demonstrate no significant findings. Lungs/Pleura: No pneumothorax or pleural effusion is noted. Calcified pleural plaque is noted posteriorly in the left hemithorax consistent with asbestos exposure. 24 x 15 mm spiculated mass is noted in the right lower lobe best seen on image number 47 of series 6 consistent with malignancy. Musculoskeletal: No chest wall abnormality. No acute or significant osseous findings. Review of the MIP images confirms the above findings. CTA ABDOMEN AND PELVIS FINDINGS VASCULAR Aorta: Atherosclerosis of abdominal aorta is noted without aneurysm or dissection. Celiac: Patent without evidence of aneurysm, dissection, vasculitis or significant stenosis. SMA: Patent without evidence of aneurysm, dissection, vasculitis or significant stenosis. Renals: Right renal artery is widely patent without significant stenosis. Moderate focal stenosis is noted at the origin of the left renal artery with poststenotic dilatation. IMA: Patent without evidence of aneurysm, dissection, vasculitis or significant stenosis. Inflow: Moderate focal stenosis is noted at the origin of the left common iliac artery with poststenotic dilatation. Mild stenosis is noted at the origin of the right common iliac artery with mild poststenotic dilatation. External iliac arteries are widely patent. Veins: No obvious venous abnormality  within the limitations of this arterial phase study. Review of the MIP images confirms the above findings. NON-VASCULAR Hepatobiliary: No focal liver abnormality is seen. Status post cholecystectomy. No biliary dilatation. Pancreas: Unremarkable. No pancreatic ductal dilatation or surrounding inflammatory changes. Spleen: Normal in size without focal abnormality. Adrenals/Urinary Tract: Adrenal glands are unremarkable. Nonobstructive calculus is noted in lower pole collecting system of left kidney. Severe right hydronephrosis is noted without ureteral dilatation, concerning for ureteropelvic junction stenosis or obstruction. Urinary bladder is unremarkable. Stomach/Bowel: Stomach is within normal limits. Appendix appears normal. No evidence of bowel wall thickening, distention, or inflammatory changes. Lymphatic: No significant adenopathy is noted. Reproductive: Uterus and bilateral adnexa are unremarkable. Other: No abdominal wall hernia or abnormality. No abdominopelvic ascites. Musculoskeletal: No acute or significant osseous findings. Review of the MIP images confirms the above findings. IMPRESSION: No evidence of thoracic or abdominal aortic dissection or aneurysm. However, 2.4 cm spiculated mass is noted in right lower lobe concerning for malignancy. PET scan is recommended for further evaluation. Moderate stenoses are seen involving the origins of the left renal and common iliac arteries. Mild stenosis is noted at origin of right external iliac artery. Nonobstructive left renal calculus is noted. Severe right hydronephrosis is noted without ureteral dilatation, concerning for ureteropelvic junction stenosis or obstruction. Calcified pleural plaque is noted posteriorly in left lower lobe consistent with asbestos exposure. Aortic Atherosclerosis (ICD10-I70.0). Electronically Signed   By: Marijo Conception M.D.   On: 06/25/2019 12:00     Cardiac Studies   ECHO:  06/25/2019 1. Left ventricular ejection  fraction, by visual estimation, is 60 to  65%. The left ventricle has normal function. There is moderately increased  left ventricular hypertrophy.  2. Left ventricular diastolic parameters are indeterminate.  3. Global right ventricle has normal systolic function.The right  ventricular size is normal.  4. Left atrial size was normal.  5. Right atrial size was mildly dilated.  6. Moderate thickening of the mitral valve leaflet(s).  7. The mitral valve is normal in structure. Trivial mitral valve  regurgitation.  8. The tricuspid valve is normal in structure. Tricuspid valve  regurgitation is trivial.  9. Bioprosthetic aortic valve with normal function. Aortic valve  regurgitation is not visualized. Aortic valve mean gradient measures 3  mmHg  10. The pulmonic valve was not well visualized. Pulmonic valve  regurgitation is not visualized.  11. The inferior vena cava is normal in size with greater than 50%  respiratory variability, suggesting right atrial pressure of 3 mmHg.  12. The tricuspid regurgitant velocity is 2.59 m/s, and with an assumed  right atrial pressure of 3 mmHg, the estimated right ventricular systolic  pressure is normal at 29.8 mmHg.   Patient Profile     86 y.o. female w/ hx nl cors 2012, HTN, HLD, COPD on home O2, AVR w/ bioprosthetic valve & post-op Afib, was admitted 02/07 with CP, HTN urgency, 2.4 cm spiculated mass on CT.  Assessment & Plan    1. CP: -Sx improved w/ nitro but BP 200/100 at the time - ECG not acute - mild elevation in hsTrop - EF nl on echo w/ no WMA mentioned and AoV is ok - MD advise on further eval - she is NPO, Cr up but could do MV  2. COPD - On home O2 - at baseline  3. HTN - reason she called EMS - she is on home dose amlodipine 2.5 mg qd  - unclear why BP spiked so high, but could increase amlodipine to 5 mg qd, discuss w/ MD - on CT, had moderate renal artery stenosis, renal US ordered, discuss w/ MD if we should  get today  4. Spiculated mass on CT - PET scan recommended for eval - if surgery needed, they may want stress test first - per IM  Otherwise, per IM Active Problems:   Chronic obstructive pulmonary disease ([place])   Hypertension   Chest pain   Unintentional weight loss   Stage 3b chronic kidney disease   Chest pain in adult    Jonetta Speak , PA-C 7:21 AM 06/26/2019 Pager: [phone]

## 2019-06-26 NOTE — Discharge Summary (Signed)
Physician Discharge Summary  [name] TTS:[id number] DOB: February 09, 1930 DOA: 06/25/2019  PCP: Mellody Dance, DO  Admit date: 06/25/2019 Discharge date: 06/26/2019  Time spent: 45 minutes  Recommendations for Outpatient Follow-up:  1. Follow up with Pulmonary to review CT revealing RLL mass 2. Follow up with PCP 1-2 weeks to evaluate symptoms and right non-obstructive hydronephrosis.  Discharge Diagnoses:  Principal Problem:   Chest pain Active Problems:   Chronic obstructive pulmonary disease (HCC)   Chronic respiratory failure with hypoxia (HCC)   Stage 3b chronic kidney disease   Chest pain in adult   Hydronephrosis   Carotid artery disease (HCC)   Hypertension   Unintentional weight loss   Discharge Condition: stabke   Diet recommendation: heart healthy  Filed Weights   06/26/19 0531  Weight: 50.9 kg    History of present illness:  [name] is a 86 y.o. female with medical history significant of PAF, AVR-tissue, HTN, oxygen dependent/steroid dependent COPD/emphysema who awoke at 6 AM on 2/7 with severe HA. She checked her BP which she reported as >200/180. Subsequently she had marked chest pain, described as substernal, heavy-pressure like in nature. After about an hour she called EMS. In route she was given NTG SL and 4 ASA with complete resolution of her chest pain. She presented to [facility] for further evaluation.  Hospital Course:  1. Chest pain - patient with ASVD, AoVR with episode of retrosternal CP relieved by NTG. Initial Troponins negative. No acute EKG changes. Patient takes daily prednisone w/o GI protection. Evaluated by cardiology who opined no evaluation of chest pain at this time as treatment options are limited given her comorbidities.  Recommended incorporating anti-ischemic therapy in her medication regimen as well as preventive therapy for vascular events.  Medications adjusted accordingly.  Patient is chest pain-free at discharge  2. Pulm - advanced  O2/steroid dependent COPD/emphysema. No evidence of exacerbation.  Oxygen saturation level remained greater than 90% on her home oxygen dose.  T does show a 2.4 cm right lower lobe mass.  I contacted pulmonology who arranged for an appointment with her primary pulmonologist on February 19.  3. Hypertension - BP controlled.  4. Lung nodule - informed patient of spiculated lesion RLL.  See #2 Procedures:    Consultations:  Dr. Tamala Julian cardiology  Discharge Exam: Vitals:   06/25/19 2025 06/26/19 0531  BP: (!) 168/74 (!) 151/70  Pulse: 64 67  Resp:  18  Temp: 98.8 F (37.1 C) 98 F (36.7 C)  SpO2: 99% 100%    General: Awake alert thin frail chronically ill-appearing no acute distress Cardiovascular: rrr no mgr no LE edema Respiratory: normal effort BS distant and coarse. No crackles  Discharge Instructions   Discharge Instructions    Call MD for:  persistant dizziness or light-headedness   Complete by: As directed    Call MD for:  persistant nausea and vomiting   Complete by: As directed    Call MD for:  severe uncontrolled pain   Complete by: As directed    Call MD for:  temperature >100.4   Complete by: As directed    Diet - low sodium heart healthy   Complete by: As directed    Discharge instructions   Complete by: As directed    Take medications as prescribed Follow up with Dr Malvin Johns 07/07/19 as scheduled for evaluation of RLL mass Follow up with PCP 1-2 weeks for evaluation of urine output and hydronephrosis via CT   Increase activity slowly  Complete by: As directed      Allergies as of 06/26/2019      Reactions   Clarithromycin    Mouth/throat sore   Tamsulosin Itching   Trazodone And Nefazodone    Dizziness   Penicillins Itching, Rash      Medication List    STOP taking these medications   simvastatin 20 MG tablet Commonly known as: ZOCOR     TAKE these medications   albuterol 108 (90 Base) MCG/ACT inhaler Commonly known as: ProAir HFA Inhale 2  puffs into the lungs every 6 (six) hours as needed for wheezing or shortness of breath.   amLODipine 2.5 MG tablet Commonly known as: NORVASC Take 1 tablet (2.5 mg total) by mouth daily. Start taking on: June 27, 2019 What changed:   medication strength  how much to take  how to take this  when to take this  additional instructions   aspirin 81 MG tablet Take 81 mg by mouth daily.   buPROPion 75 MG tablet Commonly known as: Wellbutrin 1/2 tablet twice daily for 2 weeks then 1 full tablet twice daily What changed:   how much to take  how to take this  when to take this  additional instructions   isosorbide mononitrate 30 MG 24 hr tablet Commonly known as: IMDUR Take 0.5 tablets (15 mg total) by mouth daily. Start taking on: June 27, 2019   levothyroxine 112 MCG tablet Commonly known as: SYNTHROID Take 1 tablet by mouth once daily   Melatonin 10 MG Tabs Take 10 mg by mouth at bedtime.   pantoprazole 40 MG tablet Commonly known as: PROTONIX Take 1 tablet (40 mg total) by mouth 2 (two) times daily.   predniSONE 5 MG tablet Commonly known as: DELTASONE Take 1 tablet by mouth once daily with breakfast What changed: See the new instructions.   rosuvastatin 20 MG tablet Commonly known as: CRESTOR Take 1 tablet (20 mg total) by mouth daily at 6 PM.   Stiolto Respimat 2.5-2.5 MCG/ACT Aers Generic drug: Tiotropium Bromide-Olodaterol INHALE 2 PUFFS BY MOUTH ONCE DAILY. PLEASE SEE DR FOR REFILLS What changed: See the new instructions.      Allergies  Allergen Reactions  . Clarithromycin     Mouth/throat sore  . Tamsulosin Itching  . Trazodone And Nefazodone     Dizziness   . Penicillins Itching and Rash      The results of significant diagnostics from this hospitalization (including imaging, microbiology, ancillary and laboratory) are listed below for reference.    Significant Diagnostic Studies: CT Head Wo Contrast  Result Date:  06/25/2019 CLINICAL DATA:  Hypertensive headache. EXAM: CT HEAD WITHOUT CONTRAST TECHNIQUE: Contiguous axial images were obtained from the base of the skull through the vertex without intravenous contrast. COMPARISON:  02/23/2019 FINDINGS: Brain: There is no evidence for acute hemorrhage, hydrocephalus, mass lesion, or abnormal extra-axial fluid collection. No definite CT evidence for acute infarction. Diffuse loss of parenchymal volume is consistent with atrophy. Patchy low attenuation in the deep hemispheric and periventricular white matter is nonspecific, but likely reflects chronic microvascular ischemic demyelination. Vascular: No hyperdense vessel or unexpected calcification. Skull: No evidence for fracture. No worrisome lytic or sclerotic lesion. Sinuses/Orbits: Hypoplastic right mastoid air cells. The visualized paranasal sinuses and mastoid air cells are clear. Visualized portions of the globes and intraorbital fat are unremarkable. Other: None. IMPRESSION: 1. No acute intracranial abnormality. 2. Atrophy with chronic small vessel white matter ischemic disease. Electronically Signed   By: Randall Hiss  Tery Sanfilippo M.D.   On: 06/25/2019 11:41   DG Chest Portable 1 View  Result Date: 06/25/2019 CLINICAL DATA:  86 year old female with chest pain since 0600 hours today. Symptoms improved with nitroglycerin. EXAM: PORTABLE CHEST 1 VIEW COMPARISON:  Chest radiographs 03/02/2019 and earlier. FINDINGS: Portable AP upright views at 0907 hours. Chronic pulmonary hyperinflation and cardiomegaly. Prior sternotomy. Chronic increased interstitial and perihilar opacity appears stable. No pneumothorax, pulmonary edema or pleural effusion identified. No acute osseous abnormality identified. Negative visible bowel gas pattern. IMPRESSION: Chronic lung disease with hyperinflation. No superimposed acute findings are identified. Electronically Signed   By: Genevie Ann M.D.   On: 06/25/2019 09:14   ECHOCARDIOGRAM COMPLETE  Result Date:  06/25/2019   ECHOCARDIOGRAM REPORT   Patient Name:   ANALYA LOUISSAINT Date of Exam: 06/25/2019 Medical Rec #:  [id number]     Height:       64.0 in Accession #:    [id number]    Weight:       114.8 lb Date of Birth:  07/28/1929    BSA:          1.55 m Patient Age:    59 years      BP:           154/72 mmHg Patient Gender: F             HR:           67 bpm. Exam Location:  Inpatient Procedure: 2D Echo Indications:    Chest pain 786.50/R07.9  History:        Patient has prior history of Echocardiogram examinations, most                 recent 04/13/2017. COPD, Arrythmias:Atrial Flutter; Risk                 Factors:Current Smoker, Hypertension and Dyslipidemia. S/p AVR                 aoroplasty.  Sonographer:    Clayton Lefort RDCS (AE) Referring Phys: [id number]  1. Left ventricular ejection fraction, by visual estimation, is 60 to 65%. The left ventricle has normal function. There is moderately increased left ventricular hypertrophy.  2. Left ventricular diastolic parameters are indeterminate.  3. Global right ventricle has normal systolic function.The right ventricular size is normal.  4. Left atrial size was normal.  5. Right atrial size was mildly dilated.  6. Moderate thickening of the mitral valve leaflet(s).  7. The mitral valve is normal in structure. Trivial mitral valve regurgitation.  8. The tricuspid valve is normal in structure. Tricuspid valve regurgitation is trivial.  9. Bioprosthetic aortic valve with normal function. Aortic valve regurgitation is not visualized. Aortic valve mean gradient measures 3 mmHg 10. The pulmonic valve was not well visualized. Pulmonic valve regurgitation is not visualized. 11. The inferior vena cava is normal in size with greater than 50% respiratory variability, suggesting right atrial pressure of 3 mmHg. 12. The tricuspid regurgitant velocity is 2.59 m/s, and with an assumed right atrial pressure of 3 mmHg, the estimated right ventricular systolic  pressure is normal at 29.8 mmHg. FINDINGS  Left Ventricle: Left ventricular ejection fraction, by visual estimation, is 60 to 65%. The left ventricle has normal function. The left ventricle has no regional wall motion abnormalities. There is moderately increased left ventricular hypertrophy. Left ventricular diastolic parameters are indeterminate. Right Ventricle: The right ventricular size is normal. No increase in right ventricular wall  thickness. Global RV systolic function is has normal systolic function. The tricuspid regurgitant velocity is 2.59 m/s, and with an assumed right atrial pressure  of 3 mmHg, the estimated right ventricular systolic pressure is normal at 29.8 mmHg. Left Atrium: Left atrial size was normal in size. Right Atrium: Right atrial size was mildly dilated Prominent Chiari network. Pericardium: Trivial pericardial effusion is present. Mitral Valve: The mitral valve is normal in structure. There is moderate thickening of the mitral valve leaflet(s). Trivial mitral valve regurgitation. MV peak gradient, 8.9 mmHg. Tricuspid Valve: The tricuspid valve is normal in structure. Tricuspid valve regurgitation is trivial. Aortic Valve: The aortic valve has been repaired/replaced. Aortic valve regurgitation is not visualized. The aortic valve is structurally normal, with no evidence of sclerosis or stenosis. Aortic valve mean gradient measures 3.0 mmHg. Aortic valve peak gradient measures 5.1 mmHg. Bioprosthetic aortic valve valve is present in the aortic position. Pulmonic Valve: The pulmonic valve was not well visualized. Pulmonic valve regurgitation is not visualized. Pulmonic regurgitation is not visualized. Aorta: The aortic root is normal in size and structure. Venous: The inferior vena cava is normal in size with greater than 50% respiratory variability, suggesting right atrial pressure of 3 mmHg. IAS/Shunts: The interatrial septum was not well visualized.  LEFT VENTRICLE PLAX 2D LVIDd:          2.40 cm Diastology LVIDs:         1.80 cm LV e' lateral:   5.00 cm/s LV PW:         1.40 cm LV E/e' lateral: 24.4 LV IVS:        1.40 cm LV e' medial:    5.77 cm/s LV SV:         10 ml   LV E/e' medial:  21.1 LV SV Index:   6.82  RIGHT VENTRICLE             IVC RV Basal diam:  3.60 cm     IVC diam: 1.80 cm RV Mid diam:    2.60 cm RV S prime:     14.50 cm/s TAPSE (M-mode): 2.0 cm LEFT ATRIUM             Index       RIGHT ATRIUM           Index LA diam:        2.70 cm 1.75 cm/m  RA Area:     18.90 cm LA Vol (A2C):   47.3 ml 30.61 ml/m RA Volume:   56.70 ml  36.69 ml/m LA Vol (A4C):   38.2 ml 24.72 ml/m LA Biplane Vol: 45.3 ml 29.32 ml/m  AORTIC VALVE AV Vmax:           113.00 cm/s AV Vmean:          80.967 cm/s AV VTI:            0.274 m AV Peak Grad:      5.1 mmHg AV Mean Grad:      3.0 mmHg LVOT Vmax:         103.00 cm/s LVOT Vmean:        72.000 cm/s LVOT VTI:          0.313 m LVOT/AV VTI ratio: 1.14  AORTA Ao Root diam: 2.50 cm Ao Asc diam:  3.50 cm MITRAL VALVE                         TRICUSPID VALVE  MV Area (PHT): 1.94 cm              TR Peak grad:   26.8 mmHg MV Peak grad:  8.9 mmHg              TR Vmax:        259.00 cm/s MV Mean grad:  2.0 mmHg MV Vmax:       1.49 m/s              SHUNTS MV Vmean:      66.4 cm/s             Systemic VTI: 0.31 m MV VTI:        0.44 m MV PHT:        113.39 msec MV Decel Time: 391 msec MV E velocity: 122.00 cm/s 103 cm/s MV A velocity: 154.00 cm/s 70.3 cm/s MV E/A ratio:  0.79        1.5  Oswaldo Milian MD Electronically signed by Oswaldo Milian MD Signature Date/Time: 06/25/2019/8:56:53 PM    Final    CT Angio Chest/Abd/Pel for Dissection W and/or Wo Contrast  Result Date: 06/25/2019 CLINICAL DATA:  Chest pain. EXAM: CT ANGIOGRAPHY CHEST, ABDOMEN AND PELVIS TECHNIQUE: Multidetector CT imaging through the chest, abdomen and pelvis was performed using the standard protocol during bolus administration of intravenous contrast. Multiplanar reconstructed images  and MIPs were obtained and reviewed to evaluate the vascular anatomy. CONTRAST:  39m OMNIPAQUE IOHEXOL 350 MG/ML SOLN COMPARISON:  March 11, 2011. FINDINGS: CTA CHEST FINDINGS Cardiovascular: Atherosclerosis of thoracic aorta is noted without aneurysm or dissection. Great vessels are widely patent. Normal cardiac size. No pericardial effusion. Status post aortic valve repair. Mediastinum/Nodes: No enlarged mediastinal, hilar, or axillary lymph nodes. Thyroid gland, trachea, and esophagus demonstrate no significant findings. Lungs/Pleura: No pneumothorax or pleural effusion is noted. Calcified pleural plaque is noted posteriorly in the left hemithorax consistent with asbestos exposure. 24 x 15 mm spiculated mass is noted in the right lower lobe best seen on image number 47 of series 6 consistent with malignancy. Musculoskeletal: No chest wall abnormality. No acute or significant osseous findings. Review of the MIP images confirms the above findings. CTA ABDOMEN AND PELVIS FINDINGS VASCULAR Aorta: Atherosclerosis of abdominal aorta is noted without aneurysm or dissection. Celiac: Patent without evidence of aneurysm, dissection, vasculitis or significant stenosis. SMA: Patent without evidence of aneurysm, dissection, vasculitis or significant stenosis. Renals: Right renal artery is widely patent without significant stenosis. Moderate focal stenosis is noted at the origin of the left renal artery with poststenotic dilatation. IMA: Patent without evidence of aneurysm, dissection, vasculitis or significant stenosis. Inflow: Moderate focal stenosis is noted at the origin of the left common iliac artery with poststenotic dilatation. Mild stenosis is noted at the origin of the right common iliac artery with mild poststenotic dilatation. External iliac arteries are widely patent. Veins: No obvious venous abnormality within the limitations of this arterial phase study. Review of the MIP images confirms the above findings.  NON-VASCULAR Hepatobiliary: No focal liver abnormality is seen. Status post cholecystectomy. No biliary dilatation. Pancreas: Unremarkable. No pancreatic ductal dilatation or surrounding inflammatory changes. Spleen: Normal in size without focal abnormality. Adrenals/Urinary Tract: Adrenal glands are unremarkable. Nonobstructive calculus is noted in lower pole collecting system of left kidney. Severe right hydronephrosis is noted without ureteral dilatation, concerning for ureteropelvic junction stenosis or obstruction. Urinary bladder is unremarkable. Stomach/Bowel: Stomach is within normal limits. Appendix appears normal. No evidence of bowel wall thickening, distention,  or inflammatory changes. Lymphatic: No significant adenopathy is noted. Reproductive: Uterus and bilateral adnexa are unremarkable. Other: No abdominal wall hernia or abnormality. No abdominopelvic ascites. Musculoskeletal: No acute or significant osseous findings. Review of the MIP images confirms the above findings. IMPRESSION: No evidence of thoracic or abdominal aortic dissection or aneurysm. However, 2.4 cm spiculated mass is noted in right lower lobe concerning for malignancy. PET scan is recommended for further evaluation. Moderate stenoses are seen involving the origins of the left renal and common iliac arteries. Mild stenosis is noted at origin of right external iliac artery. Nonobstructive left renal calculus is noted. Severe right hydronephrosis is noted without ureteral dilatation, concerning for ureteropelvic junction stenosis or obstruction. Calcified pleural plaque is noted posteriorly in left lower lobe consistent with asbestos exposure. Aortic Atherosclerosis (ICD10-I70.0). Electronically Signed   By: Marijo Conception M.D.   On: 06/25/2019 12:00    Microbiology: Recent Results (from the past 240 hour(s))  SARS CORONAVIRUS 2 (TAT 6-24 HRS) Nasopharyngeal Nasopharyngeal Swab     Status: None   Collection Time: 06/25/19  3:17  PM   Specimen: Nasopharyngeal Swab  Result Value Ref Range Status   SARS Coronavirus 2 NEGATIVE NEGATIVE Final    Comment: (NOTE) SARS-CoV-2 target nucleic acids are NOT DETECTED. The SARS-CoV-2 RNA is generally detectable in upper and lower respiratory specimens during the acute phase of infection. Negative results do not preclude SARS-CoV-2 infection, do not rule out co-infections with other pathogens, and should not be used as the sole basis for treatment or other patient management decisions. Negative results must be combined with clinical observations, patient history, and epidemiological information. The expected result is Negative. Fact Sheet for Patients: SugarRoll.be Fact Sheet for Healthcare Providers: https://www.woods-mathews.com/ This test is not yet approved or cleared by the [place] FDA and  has been authorized for detection and/or diagnosis of SARS-CoV-2 by FDA under an Emergency Use Authorization (EUA). This EUA will remain  in effect (meaning this test can be used) for the duration of the COVID-19 declaration under Section 56 4(b)(1) of the Act, 21 U.S.C. section 360bbb-3(b)(1), unless the authorization is terminated or revoked sooner. Performed at [facility], [place] [address] [place], [place] [address]      Labs: Basic Metabolic Panel: Recent Labs  Lab 06/25/19 0931  NA 140  K 4.4  CL 101  CO2 30  GLUCOSE 100*  BUN 20  CREATININE 1.32*  CALCIUM 9.9   Liver Function Tests: Recent Labs  Lab 06/25/19 0931  AST 22  ALT 16  ALKPHOS 46  BILITOT 0.5  PROT 6.7  ALBUMIN 3.0*   Recent Labs  Lab 06/25/19 0931  LIPASE 28   No results for input(s): AMMONIA in the last 168 hours. CBC: Recent Labs  Lab 06/25/19 0931 06/26/19 0151  WBC 9.2 6.7  NEUTROABS 6.9  --   HGB 13.9 12.8  HCT 43.8 40.4  MCV 99.5 99.8  PLT 193 197   Cardiac Enzymes: Recent Labs  Lab 06/25/19 1509 06/26/19 0151   CKTOTAL 81 133  CKMB 4.8 4.5   BNP: BNP (last 3 results) No results for input(s): BNP in the last 8760 hours.  ProBNP (last 3 results) No results for input(s): PROBNP in the last 8760 hours.  CBG: No results for input(s): GLUCAP in the last 168 hours.     SignedRadene Gunning NP [facility] 06/26/2019, 1:32 PM

## 2019-06-29 ENCOUNTER — Ambulatory Visit
Admission: RE | Admit: 2019-06-29 | Discharge: 2019-06-29 | Disposition: A | Discharge status: Home / Self Care | Payer: Medicare Other | Source: Ambulatory Visit | Attending: Nephrology | Admitting: Nephrology

## 2019-06-29 DIAGNOSIS — N184 Chronic kidney disease, stage 4 (severe): Secondary | ICD-10-CM | POA: Diagnosis not present

## 2019-06-29 DIAGNOSIS — N1330 Unspecified hydronephrosis: Secondary | ICD-10-CM | POA: Diagnosis not present

## 2019-07-03 ENCOUNTER — Inpatient Hospital Stay: Payer: Medicare Other | Admitting: Family Medicine

## 2019-07-03 DIAGNOSIS — N1832 Chronic kidney disease, stage 3b: Secondary | ICD-10-CM | POA: Diagnosis not present

## 2019-07-03 DIAGNOSIS — R8271 Bacteriuria: Secondary | ICD-10-CM | POA: Diagnosis not present

## 2019-07-03 DIAGNOSIS — Q6211 Congenital occlusion of ureteropelvic junction: Secondary | ICD-10-CM | POA: Diagnosis not present

## 2019-07-04 ENCOUNTER — Encounter: Payer: Self-pay | Admitting: Family Medicine

## 2019-07-04 ENCOUNTER — Other Ambulatory Visit: Payer: Self-pay

## 2019-07-04 ENCOUNTER — Ambulatory Visit (INDEPENDENT_AMBULATORY_CARE_PROVIDER_SITE_OTHER): Payer: Medicare Other | Admitting: Family Medicine

## 2019-07-04 VITALS — BP 111/57 | HR 66 | Temp 97.4°F | Resp 10 | Ht 64.0 in | Wt 116.0 lb

## 2019-07-04 DIAGNOSIS — H6641 Suppurative otitis media, unspecified, right ear: Secondary | ICD-10-CM | POA: Diagnosis not present

## 2019-07-04 DIAGNOSIS — G4452 New daily persistent headache (NDPH): Secondary | ICD-10-CM

## 2019-07-04 DIAGNOSIS — R519 Headache, unspecified: Secondary | ICD-10-CM

## 2019-07-04 DIAGNOSIS — Z09 Encounter for follow-up examination after completed treatment for conditions other than malignant neoplasm: Secondary | ICD-10-CM | POA: Insufficient documentation

## 2019-07-04 DIAGNOSIS — J019 Acute sinusitis, unspecified: Secondary | ICD-10-CM | POA: Diagnosis not present

## 2019-07-04 MED ORDER — FLUTICASONE PROPIONATE 50 MCG/ACT NA SUSP: NASAL | 2 refills | Status: DC

## 2019-07-04 MED ORDER — AZELASTINE HCL 0.1 % NA SOLN: 1.0000 | Freq: Two times a day (BID) | NASAL | 11 refills | Status: DC

## 2019-07-04 MED ORDER — AMOXICILLIN-POT CLAVULANATE 875-125 MG PO TABS: 1.0000 | ORAL_TABLET | Freq: Two times a day (BID) | ORAL | 0 refills | Status: AC

## 2019-07-04 NOTE — Progress Notes (Signed)
Impression and Recommendations:    1. Hospital discharge follow-up   2. Suppurative otitis media of right ear, unspecified chronicity   3. New daily persistent headache-  now resolved   4. Nonintractable headache, unspecified chronicity pattern, unspecified headache type   5. Acute sinusitis, recurrence not specified, unspecified location      Hospital Discharge Follow-Up -Regarding pt's recent hospitalization and/or ED visit: reviewed in great detail recent hospitalization notes, clinical lab tests, tests in the radiology section of CPT, tests in the medicine testing of CPT, and obtained history from family member.  -Independent visualization of image, tracing, or specimen itself done by me today at point of care delivery; directly influenced my plan of care for patient. Moderate complexity  Suppurative Otitis Media of R Ear, Acute Sinusitis, Nonintractable Headache - Reviewed patient's symptoms extensively during appointment today. - Education provided and all questions answered.  - Discussed importance of clearing out the effusion in patient's ear. - Patient is allergic to penicillins; patient experiences itch and rash.   -  Per patient, believes she can tolerate amoxicillin without any difficulties, just not PCN.  Risks and benefits to use of amoxicillin discussed with patient today.  Patient understands and agrees to take amoxicillin at this time.  - Antibiotics prescribed today.  See med list.  - If antibiotics do not help her infection / headaches / symptom of sinus pressure to resolve, will refer the patient to Ear, Nose, and Throat as next step in treatment plan.    ---> Patient confirms she used to see ENT regularly and this is not a new concern.  ---> If needed, will also consider a short stent of a higher dose of steroids to help calm the pressure in the sinuses/ETD/ear.  - If desired in the future, for chronic ear care, advised patient she may clean her ears out  with a mixture of half hydrogen peroxide and half rubbing alcohol. I recommend she do this about twice weekly to keep it clean   Meds ordered this encounter  Medications  . azelastine (ASTELIN) 0.1 % nasal spray    Sig: Place 1 spray into both nostrils 2 (two) times daily. 1 spray each nostril twice daily after sinus rinses    Dispense:  30 mL    Refill:  11  . fluticasone (FLONASE) 50 MCG/ACT nasal spray    Sig: 1 spray each nostril following sinus rinses twice daily    Dispense:  16 g    Refill:  2  . amoxicillin-clavulanate (AUGMENTIN) 875-125 MG tablet    Sig: Take 1 tablet by mouth 2 (two) times daily for 10 days.    Dispense:  20 tablet    Refill:  0     Gross side effects, risk and benefits, and alternatives of medications and treatment plan in general discussed with patient.  Patient is aware that all medications have potential side effects and we are unable to predict every side effect or drug-drug interaction that may occur.   Patient will call with any questions prior to using medication if they have concerns.    Expresses verbal understanding and consents to current therapy and treatment regimen.  No barriers to understanding were identified.  Red flag symptoms and signs discussed in detail.  Patient expressed understanding regarding what to do in case of emergency\urgent symptoms  Please see AVS handed out to patient at the end of our visit for further patient instructions/ counseling done pertaining to today's office visit.  Return for f/up in 6-8wks.     Note:  This note was prepared with assistance of Dragon voice recognition software. Occasional wrong-word or sound-a-like substitutions may have occurred due to the inherent limitations of voice recognition software.   This document serves as a record of services personally performed by Mellody Dance, DO. It was created on her behalf by Toni Amend, a trained medical scribe. The creation of this record is  based on the scribe's personal observations and the provider's statements to them.   This case required medical decision making of at least moderate complexity.  This case required medical decision making of at least moderate complexity. The above documentation from Toni Amend, medical scribe, has been reviewed by Marjory Sneddon, D.O.   --------------------------------------------------------------------------------------------------------------------------------------------------------------------------------------------------------------------------------------------    Subjective:     [name], am serving as scribe for Dr.Khrystina Bonnes.   HPI: [name] is a 86 y.o. female who presents to [place] at [facility] today for issues as discussed below.  Says "I've been alright."  - [place] prior to her hospital visit, "I felt like I had a humongous indigestion, but it didn't go anywhere else."  She also waited to seek care until she took her blood pressure three times.  Believes she had the chest pain for about an hour and a half prior to calling 911.  Feels her breathing is fine.  Denies shortness of breath.  Says "I'm not coughing."  She goes to follow up with her pulmonologist on Friday.  Since her hospital visit, she is eating, drinking, having normal bowel movements, getting around fine at home.  Denies chest tightness or chest pain.  Denies confusion.  Denies constipation.   Regarding how she feels about the right lower lobe mass seen on imaging, states "I don't know."  Says the doctor at the hospital showed her an image of a ball with spines all over it that reminded her of a tree burr.  She has been sleeping a lot since taking the Coricidin HBP.  - Head Pressure, Ear Pain Notes "only thing I feel is my head.  I feel like my head's stuffed up and it hurts."  Says she feels her "ears hurt."  States that this fullness in her  head is "the reason I went to the hospital."  Notes she continues having a headache since her hospital visit.  Ranks her pain at a 4-5.  She takes tylenol when she gets these headaches, and it makes it "mostly" go away.  Notes she has the headaches every day, and takes tylenol "just some days."  The pain varies; some days she takes tylenol and some days it's not bad enough to take tylenol.  Notes that she wakes up in the middle of the night with a headache sometimes, and this has been occurring for many months.  Does not think that her headache is worse when she awakens in the morning.  Notes the pain is in the "middle" of her ears.  Says that this headache / pressure has not stopped her from living her life.  "I don't think it's stopped me."  Feels she continues living life as she wishes, but the pain is still there.  Does not know if she has sinus problems, but states that she's had issues with her ears since she was a child.  When she went to the ENT in the past, she was told that her problem was her tonsils.  She does  not use Flonase.  In addition, reports that she has had double vision.  Notes "it's not all the time; it's like a certain way I look at something, I get double vision, but if I turn my head, it goes away."  This typically occurs when she's watching TV.  Says it happens with "a certain way I turn my head."  Denies loss of vision / loss of field of vision.  Denies fever, denies one-sided face pain.  Denies tooth pain.  Has not been sleeping in a more elevated position recently vs prior.  - Family History of Breast Cancer Her sister has had breast cancer twice, and two of her sister's daughters have had it.  - Lifestyle Habits She continues going to church.    Wt Readings from Last 3 Encounters:  07/04/19 116 lb (52.6 kg)  06/26/19 112 lb 4.8 oz (50.9 kg)  06/22/19 114 lb 12.8 oz (52.1 kg)   BP Readings from Last 3 Encounters:  07/04/19 (!) 111/57  06/26/19 (!) 151/70    06/22/19 (!) 142/78   Pulse Readings from Last 3 Encounters:  07/04/19 66  06/26/19 67  06/22/19 77   BMI Readings from Last 3 Encounters:  07/04/19 19.91 kg/m  06/26/19 19.28 kg/m  06/22/19 19.71 kg/m     Patient Care Team    Relationship Specialty Notifications Start End  Mellody Dance, DO PCP - General Family Medicine  10/08/15   Belva Crome, MD PCP - Cardiology Cardiology Admissions 07/18/17   Rexene Alberts, MD  Cardiothoracic Surgery  03/05/11   Belva Crome, MD  Cardiology  03/05/11   Collene Gobble, MD Consulting Physician Pulmonary Disease  11/05/15   Magdalen Spatz, NP Nurse Practitioner Pulmonary Disease  11/05/15   Brunetta Genera, MD Consulting Physician Hematology  07/07/19      Patient Active Problem List   Diagnosis Date Noted  . History of smoking   ( 43 yrs at 0.5ppd) ;1991- until 2012 ( 8 cig/day )   02/09/2017  . S/P aortic valve replacement with stentless valve 03/18/2011  . Carotid artery disease ([place])   . Hypertension   . Chronic obstructive pulmonary disease ([place]) 03/02/2011  . Osteopenia after menopause 07/07/2017  . Osteopenia 11/05/2015  . h/o Intention tremor  11/05/2015  . S/P aortic valve replacement and aortoplasty 04/13/2011  . Nonintractable headache 07/04/2019  . Suppurative otitis media of right ear 07/04/2019  . Hospital discharge follow-up 07/04/2019  . Hydronephrosis 06/26/2019  . Chest pain in adult 06/25/2019  . Hypertensive urgency   . History of aortic valve replacement   . Hyperlipidemia LDL goal <70   . New daily persistent headache-  now resolved 02/28/2019  . Stage 3b chronic kidney disease 02/28/2019  . Prediabetes 02/28/2019  . History of iron deficiency anemia 02/28/2019  . Urinary frequency 02/28/2019  . Dysthymia 02/28/2019  . Unintentional weight loss 02/09/2019  . Chronic respiratory failure with hypoxia ([place]) 02/09/2019  . Insomnia 04/28/2018  . Iron deficiency anemia 04/28/2018  . Estrogen  deficiency 04/28/2018  . H. pylori infection w/ GERD sx 10/19/2017  . Essential hypertension 10/19/2017  . Medication monitoring encounter 07/07/2017  . Family history of breast cancer in sister 07/07/2017  . Family history of bladder cancer-in mother 07/07/2017  . Chest pain 07/07/2017  . Elevated hemoglobin A1c 11/13/2015  . Hematuria 11/05/2015  . Hypothyroidism; on synthroid 11/05/2015  . h/o Hypercalcemia 11/05/2015  . h/o RLS (restless legs syndrome) 11/05/2015  .  History of nephrolithiasis 11/05/2015  . h/o Atrial fibrillation ([place]) 03/22/2011    Past Medical history, Surgical history, Family history, Social history, Allergies and Medications have been entered into the medical record, reviewed and changed as needed.    Current Meds  Medication Sig  . albuterol (PROAIR HFA) 108 (90 Base) MCG/ACT inhaler Inhale 2 puffs into the lungs every 6 (six) hours as needed for wheezing or shortness of breath.  Marland Kitchen amLODipine (NORVASC) 2.5 MG tablet Take 1 tablet (2.5 mg total) by mouth daily.  Marland Kitchen aspirin 81 MG tablet Take 81 mg by mouth daily.  Marland Kitchen buPROPion (WELLBUTRIN) 75 MG tablet 1/2 tablet twice daily for 2 weeks then 1 full tablet twice daily (Patient taking differently: Take 75 mg by mouth 2 (two) times daily. )  . isosorbide mononitrate (IMDUR) 30 MG 24 hr tablet Take 0.5 tablets (15 mg total) by mouth daily.  Marland Kitchen levothyroxine (SYNTHROID) 112 MCG tablet Take 1 tablet by mouth once daily  . Melatonin 10 MG TABS Take 10 mg by mouth at bedtime.   . pantoprazole (PROTONIX) 40 MG tablet Take 1 tablet (40 mg total) by mouth 2 (two) times daily.  . predniSONE (DELTASONE) 5 MG tablet Take 1 tablet by mouth once daily with breakfast (Patient taking differently: Take 5 mg by mouth daily with breakfast. )  . rosuvastatin (CRESTOR) 20 MG tablet Take 1 tablet (20 mg total) by mouth daily at 6 PM.  . STIOLTO RESPIMAT 2.5-2.5 MCG/ACT AERS INHALE 2 PUFFS BY MOUTH ONCE DAILY. PLEASE SEE DR FOR REFILLS  (Patient taking differently: Inhale 2 puffs into the lungs daily. )    Allergies:  Allergies  Allergen Reactions  . Clarithromycin     Mouth/throat sore  . Tamsulosin Itching  . Trazodone And Nefazodone     Dizziness   . Penicillins Itching and Rash     Review of Systems:  A fourteen system review of systems was performed and found to be positive as per HPI.   Objective:   Blood pressure (!) 111/57, pulse 66, temperature (!) 97.4 F (36.3 C), temperature source Oral, resp. rate 10, height _0  (1.626 m), weight 116 lb (52.6 kg), SpO2 97 %. Body mass index is 19.91 kg/m. General:  Well Developed, well nourished, appropriate for stated age.  Neuro:  Alert and oriented,  extra-ocular muscles intact  HEENT:  Normocephalic, atraumatic, neck supple, no carotid bruits appreciated.   Right: TM bulging with yellow effusion, no air fluid levels, no erythema.  No abnormality otherwise.  Left: Within normal limits.  no mastoid tenderness or tenderness in any bony prominences periauricular or of the face Skin:  no gross rash, warm, pink. Cardiac:  RRR, S1 S2 Respiratory:  ECTA B/L and A/P, Not using accessory muscles, speaking in full sentences- unlabored. Vascular:  Ext warm, no cyanosis apprec.; cap RF less 2 sec. Psych:  No HI/SI, judgement and insight good, Euthymic mood. Full Affect.

## 2019-07-07 ENCOUNTER — Inpatient Hospital Stay: Payer: Medicare Other | Admitting: Emergency Medicine

## 2019-07-17 DIAGNOSIS — J438 Other emphysema: Secondary | ICD-10-CM | POA: Diagnosis not present

## 2019-07-17 DIAGNOSIS — J449 Chronic obstructive pulmonary disease, unspecified: Secondary | ICD-10-CM | POA: Diagnosis not present

## 2019-07-18 DIAGNOSIS — N184 Chronic kidney disease, stage 4 (severe): Secondary | ICD-10-CM | POA: Diagnosis not present

## 2019-07-21 ENCOUNTER — Other Ambulatory Visit: Payer: Self-pay | Admitting: Family Medicine

## 2019-07-21 DIAGNOSIS — E038 Other specified hypothyroidism: Secondary | ICD-10-CM

## 2019-07-21 DIAGNOSIS — J438 Other emphysema: Secondary | ICD-10-CM

## 2019-07-24 ENCOUNTER — Telehealth: Payer: Self-pay | Admitting: Family Medicine

## 2019-07-24 ENCOUNTER — Other Ambulatory Visit: Payer: Self-pay

## 2019-07-24 ENCOUNTER — Encounter: Payer: Self-pay | Admitting: Emergency Medicine

## 2019-07-24 ENCOUNTER — Ambulatory Visit: Payer: Medicare Other | Admitting: Emergency Medicine

## 2019-07-24 DIAGNOSIS — J9611 Chronic respiratory failure with hypoxia: Secondary | ICD-10-CM | POA: Diagnosis not present

## 2019-07-24 DIAGNOSIS — E038 Other specified hypothyroidism: Secondary | ICD-10-CM

## 2019-07-24 DIAGNOSIS — J432 Centrilobular emphysema: Secondary | ICD-10-CM | POA: Diagnosis not present

## 2019-07-24 DIAGNOSIS — R911 Solitary pulmonary nodule: Secondary | ICD-10-CM | POA: Insufficient documentation

## 2019-07-24 NOTE — Assessment & Plan Note (Addendum)
Continue her current supplemental oxygen as ordered

## 2019-07-24 NOTE — Patient Instructions (Addendum)
Your CT scan of the chest that was done on 06/25/2019 has an area that is concerning for a lung cancer.  We talked about this today, including the risks, benefits, pros and cons of doing procedures to make a definite diagnosis, possible treatment options.  Please discuss this with your sons and then return to discuss here with Dr. Lamonte Sakai along with your sons if possible.  This way we can create a plan for how to proceed.  This might include following the abnormality over time or planning procedures to investigate it further. We will arrange for a follow-up visit with you and family soon as possible.

## 2019-07-24 NOTE — Assessment & Plan Note (Addendum)
2.4 x 1.5 cm spiculated right lower lobe mass.  Very concerning for primary malignancy based on appearance alone, tobacco history, family history of malignancy.  I talked her today about the pros and cons of conservative versus aggressive evaluation.  She does understand that she is not a surgical candidate, may poorly tolerate XRT or chemotherapy.  That being said I think we need to contemplate whether we should be aggressive with achieving a tissue diagnosis.  She would be high risk for general anesthesia and navigational bronchoscopy.  TTNA would save her the risk of anesthesia but would probably increase her risk for pneumothorax.  I would like to get her back here with her sons to talk about the pros, cons get their input.  Depending on the philosophy for her care we will decide whether to proceed with bronchoscopy, TTNA or serial CT scans

## 2019-07-24 NOTE — Assessment & Plan Note (Addendum)
Severe disease with associated hypoxemia, overall stable on her current regimen of Stiolto, prednisone 5 mg, albuterol.  That being said her ability to tolerate procedures, including bronchoscopy, TTNA, is in question.  We will have to factor this in as we discuss her abnormal CT.

## 2019-07-24 NOTE — Progress Notes (Signed)
Subjective:    Patient ID: [name], female    DOB: Sep 13, 1929, 86 y.o.   MRN: [id number]  HPI [name] is 5 and has a history of COPD and associated chronic hypoxemic respiratory failure.  She only intermittently desaturates on exertion and did not desaturate when we walked her last time.  She has purchased a portable oxygen concentrator and uses 2 L/min with exertion.  She is managed on Stiolto, rarely uses albuterol.  She reports that her breathing is stable - she has dyspnea when walking up hill. She still gets outside and ride lawnmower. Does her inside chores slowly - with her O2 on. Rarely uses albuterol. She is on stable dose of pred 21m daily. No flares, no exacerbations reported.  PNA vaccine up to date. Flu shot done last week. Minimal cough. She does have some chronic congestion. She has lost 13 lbs in 10 months, unintentional.   Hospital follow-up 07/24/2019 --86year old woman with a history of paroxysmal atrial fibrillation, aortic valve replacement, hypertension, followed here for severe COPD with associated hypoxemic respiratory failure, hypercalcemia.  She was admitted for severe HTN and associated "indigestion" early February. Cardiac eval was reassuring.  Part of her evaluation included a CT scan of the chest 06/25/2019 which I have reviewed, shows calcified pleural plaquing and a 2.4 x 1.5 cm spiculated right lower lobe mass lesion concerning for possible malignancy. She wasn't having any cough, CP, sputum when she was admitted.   Currently on prednisone 542m stiolto, albuterol which she uses rarely  Reviewed all of her imaging, recent hospital notes, discharge summary   Review of Systems     Objective:   Physical Exam Vitals:   07/24/19 1640  BP: 108/66  Pulse: 77  Temp: (!) 97.3 F (36.3 C)  TempSrc: Temporal  SpO2: 97%  Weight: 113 lb 9.6 oz (51.5 kg)  Height: _0  (1.626 m)   Gen: Pleasant, thin, in no distress,  normal affect  ENT: No lesions,  mouth  clear,  oropharynx clear, no postnasal drip  Neck: No JVD, no stridor  Lungs: No use of accessory muscles, no crackles or wheezing on normal respiration, no wheeze on forced expiration  Cardiovascular: RRR, heart sounds normal, no murmur or gallops, no peripheral edema  Musculoskeletal: No deformities, no cyanosis or clubbing  Neuro: alert, awake, non focal  Skin: Warm, no lesions or rash     Assessment & Plan:  Chronic obstructive pulmonary disease (HCC) Severe disease with associated hypoxemia, overall stable on her current regimen of Stiolto, prednisone 5 mg, albuterol.  That being said her ability to tolerate procedures, including bronchoscopy, TTNA, is in question.  We will have to factor this in as we discuss her abnormal CT.  Chronic respiratory failure with hypoxia (HCC) Continue her current supplemental oxygen as ordered  Pulmonary nodule 1 cm or greater in diameter 2.4 x 1.5 cm spiculated right lower lobe mass.  Very concerning for primary malignancy based on appearance alone, tobacco history, family history of malignancy.  I talked her today about the pros and cons of conservative versus aggressive evaluation.  She does understand that she is not a surgical candidate, may poorly tolerate XRT or chemotherapy.  That being said I think we need to contemplate whether we should be aggressive with achieving a tissue diagnosis.  She would be high risk for general anesthesia and navigational bronchoscopy.  TTNA would save her the risk of anesthesia but would probably increase her risk for pneumothorax.  I  would like to get her back here with her sons to talk about the pros, cons get their input.  Depending on the philosophy for her care we will decide whether to proceed with bronchoscopy, TTNA or serial CT scans    Baltazar Apo, MD, PhD 07/24/2019, 5:27 PM [facility] [phone] or if no answer [phone]

## 2019-07-24 NOTE — Telephone Encounter (Signed)
Patient's pharmacy [facility]) called to say they are unable to get the : Brand Name  levothyroxine (SYNTHROID) 112 MCG tablet [[id number]]   Order Details Dose, Route, Frequency: As Directed  Dispense Quantity: 90 tablet Refills: 0       Sig: Take 1 tablet by mouth once daily   --& ask that some one call them back to approve of switching brands.  Forwarding message to med asst to call [phone].  -glh

## 2019-07-25 MED ORDER — LEVOTHYROXINE SODIUM 112 MCG PO TABS: 112.0000 ug | ORAL_TABLET | Freq: Every day | ORAL | 0 refills | Status: DC

## 2019-07-25 NOTE — Telephone Encounter (Signed)
Spoke with pharmacist at [facility]. They are requesting to change manufacturer due to the old manufacturer not producing medication anymore. The medication will be the exact same and will be covered by insurance -per Pharmacist. I advised ok for the switch. AS, CMA

## 2019-07-27 DIAGNOSIS — N1330 Unspecified hydronephrosis: Secondary | ICD-10-CM | POA: Diagnosis not present

## 2019-07-27 DIAGNOSIS — N200 Calculus of kidney: Secondary | ICD-10-CM | POA: Diagnosis not present

## 2019-07-27 DIAGNOSIS — I129 Hypertensive chronic kidney disease with stage 1 through stage 4 chronic kidney disease, or unspecified chronic kidney disease: Secondary | ICD-10-CM | POA: Diagnosis not present

## 2019-07-27 DIAGNOSIS — R809 Proteinuria, unspecified: Secondary | ICD-10-CM | POA: Diagnosis not present

## 2019-07-27 DIAGNOSIS — N1832 Chronic kidney disease, stage 3b: Secondary | ICD-10-CM | POA: Diagnosis not present

## 2019-08-03 ENCOUNTER — Ambulatory Visit: Payer: Medicare Other | Admitting: Emergency Medicine

## 2019-08-15 ENCOUNTER — Other Ambulatory Visit: Payer: Self-pay

## 2019-08-15 ENCOUNTER — Telehealth: Payer: Self-pay

## 2019-08-15 ENCOUNTER — Emergency Department (HOSPITAL_COMMUNITY)
Admission: EM | Admit: 2019-08-15 | Discharge: 2019-08-16 | Disposition: A | Discharge status: Home / Self Care | Payer: Medicare Other | Attending: Emergency Medicine | Admitting: Emergency Medicine

## 2019-08-15 ENCOUNTER — Emergency Department (HOSPITAL_COMMUNITY): Payer: Medicare Other

## 2019-08-15 DIAGNOSIS — R0789 Other chest pain: Secondary | ICD-10-CM | POA: Insufficient documentation

## 2019-08-15 DIAGNOSIS — I10 Essential (primary) hypertension: Secondary | ICD-10-CM | POA: Diagnosis not present

## 2019-08-15 DIAGNOSIS — E039 Hypothyroidism, unspecified: Secondary | ICD-10-CM | POA: Insufficient documentation

## 2019-08-15 DIAGNOSIS — M48061 Spinal stenosis, lumbar region without neurogenic claudication: Secondary | ICD-10-CM | POA: Diagnosis not present

## 2019-08-15 DIAGNOSIS — R0602 Shortness of breath: Secondary | ICD-10-CM | POA: Diagnosis not present

## 2019-08-15 DIAGNOSIS — R1032 Left lower quadrant pain: Secondary | ICD-10-CM | POA: Diagnosis not present

## 2019-08-15 DIAGNOSIS — M546 Pain in thoracic spine: Secondary | ICD-10-CM | POA: Diagnosis not present

## 2019-08-15 DIAGNOSIS — N132 Hydronephrosis with renal and ureteral calculous obstruction: Secondary | ICD-10-CM | POA: Diagnosis not present

## 2019-08-15 DIAGNOSIS — R9431 Abnormal electrocardiogram [ECG] [EKG]: Secondary | ICD-10-CM | POA: Diagnosis not present

## 2019-08-15 DIAGNOSIS — Z87891 Personal history of nicotine dependence: Secondary | ICD-10-CM | POA: Diagnosis not present

## 2019-08-15 DIAGNOSIS — R079 Chest pain, unspecified: Secondary | ICD-10-CM | POA: Diagnosis not present

## 2019-08-15 DIAGNOSIS — Z743 Need for continuous supervision: Secondary | ICD-10-CM | POA: Diagnosis not present

## 2019-08-15 DIAGNOSIS — M549 Dorsalgia, unspecified: Secondary | ICD-10-CM | POA: Diagnosis not present

## 2019-08-15 DIAGNOSIS — T148XXA Other injury of unspecified body region, initial encounter: Secondary | ICD-10-CM

## 2019-08-15 DIAGNOSIS — R52 Pain, unspecified: Secondary | ICD-10-CM

## 2019-08-15 DIAGNOSIS — R000 Tachycardia, unspecified: Secondary | ICD-10-CM | POA: Diagnosis not present

## 2019-08-15 LAB — CBC
HCT: 44.3 % (ref 36.0–46.0)
Hemoglobin: 13.9 g/dL (ref 12.0–15.0)
MCH: 31 pg (ref 26.0–34.0)
MCHC: 31.4 g/dL (ref 30.0–36.0)
MCV: 98.9 fL (ref 80.0–100.0)
Platelets: 214 K/uL (ref 150–400)
RBC: 4.48 MIL/uL (ref 3.87–5.11)
RDW: 12.5 % (ref 11.5–15.5)
WBC: 9.4 K/uL (ref 4.0–10.5)
nRBC: 0 % (ref 0.0–0.2)

## 2019-08-15 LAB — BASIC METABOLIC PANEL
Anion gap: 9 (ref 5–15)
BUN: 22 mg/dL (ref 8–23)
CO2: 29 mmol/L (ref 22–32)
Calcium: 9.7 mg/dL (ref 8.9–10.3)
Chloride: 100 mmol/L (ref 98–111)
Creatinine, Ser: 1.38 mg/dL — ABNORMAL HIGH (ref 0.44–1.00)
GFR calc Af Amer: 40 mL/min — ABNORMAL LOW (ref >60)
GFR calc non Af Amer: 34 mL/min — ABNORMAL LOW (ref >60)
Glucose, Bld: 119 mg/dL — ABNORMAL HIGH (ref 70–99)
Potassium: 5.3 mmol/L — ABNORMAL HIGH (ref 3.5–5.1)
Sodium: 138 mmol/L (ref 135–145)

## 2019-08-15 LAB — TROPONIN I (HIGH SENSITIVITY)
Troponin I (High Sensitivity): 7 ng/L
Troponin I (High Sensitivity): 6 ng/L

## 2019-08-15 MED ORDER — SODIUM CHLORIDE 0.9% FLUSH: 3.0000 mL | Freq: Once | INTRAVENOUS | Status: DC

## 2019-08-15 NOTE — Telephone Encounter (Signed)
Pt came to office c/o left rib pain with radiation thru abdomen and shoulder blades after bending over the arm of a chair to pick up something on the floor.  Pt states that this began approximately 2 weeks ago and has continued to worsen over this time.  Since there are no available appts with Dr. Raliegh Scarlet today, advised pt to proceed to [facility] for evaluation.  Pt expressed understanding and is agreeable.  Charyl Bigger, CMA

## 2019-08-15 NOTE — ED Triage Notes (Signed)
Pt here via EMS from Urgent Care for abnormal EKG after presenting for chest pain since last night at 2200. 324 ASA and 1 nitro given PTA with some relief. Pt on 2L O2 continuously. On arrival patient has central chest pain with radiation to back.

## 2019-08-16 ENCOUNTER — Telehealth (HOSPITAL_COMMUNITY): Payer: Self-pay | Admitting: Emergency Medicine

## 2019-08-16 ENCOUNTER — Emergency Department (HOSPITAL_COMMUNITY): Payer: Medicare Other

## 2019-08-16 DIAGNOSIS — R079 Chest pain, unspecified: Secondary | ICD-10-CM | POA: Diagnosis not present

## 2019-08-16 DIAGNOSIS — M549 Dorsalgia, unspecified: Secondary | ICD-10-CM | POA: Diagnosis not present

## 2019-08-16 DIAGNOSIS — N132 Hydronephrosis with renal and ureteral calculous obstruction: Secondary | ICD-10-CM | POA: Diagnosis not present

## 2019-08-16 DIAGNOSIS — R0789 Other chest pain: Secondary | ICD-10-CM | POA: Diagnosis not present

## 2019-08-16 MED ORDER — METHOCARBAMOL 500 MG PO TABS: 500.0000 mg | ORAL_TABLET | Freq: Two times a day (BID) | ORAL | 0 refills | Status: DC

## 2019-08-16 MED ORDER — METHOCARBAMOL 500 MG PO TABS
500.0000 mg | ORAL_TABLET | Freq: Once | ORAL | Status: AC
  Administered 2019-08-16: 500 mg via ORAL
  Filled 2019-08-16: qty 1

## 2019-08-16 MED ORDER — OXYCODONE-ACETAMINOPHEN 5-325 MG PO TABS: 1.0000 | ORAL_TABLET | ORAL | 0 refills | Status: DC | PRN

## 2019-08-16 MED ORDER — OXYCODONE-ACETAMINOPHEN 5-325 MG PO TABS
1.0000 | ORAL_TABLET | Freq: Once | ORAL | Status: AC
  Administered 2019-08-16: 1 via ORAL
  Filled 2019-08-16: qty 1

## 2019-08-16 MED ORDER — AMLODIPINE BESYLATE 5 MG PO TABS
5.0000 mg | ORAL_TABLET | Freq: Once | ORAL | Status: AC
  Administered 2019-08-16: 5 mg via ORAL
  Filled 2019-08-16: qty 1

## 2019-08-16 MED ORDER — OXYCODONE-ACETAMINOPHEN 5-325 MG PO TABS: 1.0000 | ORAL_TABLET | Freq: Four times a day (QID) | ORAL | 0 refills | Status: DC | PRN

## 2019-08-16 MED ORDER — IOHEXOL 350 MG/ML SOLN
75.0000 mL | Freq: Once | INTRAVENOUS | Status: AC | PRN
  Administered 2019-08-16: 75 mL via INTRAVENOUS

## 2019-08-16 MED ORDER — PREDNISONE 20 MG PO TABS
60.0000 mg | ORAL_TABLET | Freq: Once | ORAL | Status: AC
  Administered 2019-08-16: 05:00:00 60 mg via ORAL
  Filled 2019-08-16: qty 3

## 2019-08-16 NOTE — Telephone Encounter (Signed)
done

## 2019-08-16 NOTE — ED Provider Notes (Signed)
Emergency Department Provider Note   I have reviewed the triage vital signs and the nursing notes.   HISTORY  Chief Complaint Chest Pain   HPI [name] is a 86 y.o. female who presents the emerge department today with chest pain.  Patient states that she had an episode a couple weeks ago she was leaning over to get something in her left side she had acute onset of left flank pain on that side.  Has progressively worsened where she now has left flank and anterior chest pain.  Continued has not gotten any better.  She denies it gets better with Tylenol.  She has no shortness of breath or cough but it does not be somewhat pleuritic in nature.  Is also very positional.  No right has before.  No rashes.  She went to urgent care where they did an EKG and were concerned about it and sent her here for further evaluation.   No other associated or modifying symptoms.    Past Medical History:  Diagnosis Date  . Allergic rhinitis   . Aortic stenosis   . Carotid artery disease ([place])   . Colon polyps   . h/o Atrial fibrillation ([place]) 03/22/2011   Brief post op   . Hematuria 11/05/2015  . Hypercalcemia   . Hypertension   . Hypothyroid   . Osteopenia   . 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 Pressure urticaria   . RLS (restless legs syndrome)   . S/P aortic valve replacement and aortoplasty 04/13/2011   53m Medtronic Freestyle porcine aortic root   . S/P aortic valve replacement with stentless valve 03/18/2011    Patient Active Problem List   Diagnosis Date Noted  . Pulmonary nodule 1 cm or greater in diameter 07/24/2019  . Nonintractable headache 07/04/2019  . Suppurative otitis media of right ear 07/04/2019  . Hospital discharge follow-up 07/04/2019  . Hydronephrosis 06/26/2019  . Chest pain in adult 06/25/2019  . Hypertensive urgency   . History of aortic valve replacement   . Hyperlipidemia LDL goal <70   . New daily persistent headache-  now resolved 02/28/2019  . Stage 3b  chronic kidney disease 02/28/2019  . Prediabetes 02/28/2019  . History of iron deficiency anemia 02/28/2019  . Urinary frequency 02/28/2019  . Dysthymia 02/28/2019  . Unintentional weight loss 02/09/2019  . Chronic respiratory failure with hypoxia ([place] 02/09/2019  . Insomnia 04/28/2018  . Iron deficiency anemia 04/28/2018  . Estrogen deficiency 04/28/2018  . H. pylori infection w/ GERD sx 10/19/2017  . Essential hypertension 10/19/2017  . Medication monitoring encounter 07/07/2017  . Family history of breast cancer in sister 07/07/2017  . Family history of bladder cancer-in mother 07/07/2017  . Osteopenia after menopause 07/07/2017  . Chest pain 07/07/2017  . History of smoking   ( 43 yrs at 0.5ppd) ;1991- until 2012 ( 8 cig/day )   02/09/2017  . Elevated hemoglobin A1c 11/13/2015  . Hematuria 11/05/2015  . Hypothyroidism; on synthroid 11/05/2015  . Osteopenia 11/05/2015  . h/o Hypercalcemia 11/05/2015  . h/o RLS (restless legs syndrome) 11/05/2015  . h/o Intention tremor  11/05/2015  . History of nephrolithiasis 11/05/2015  . S/P aortic valve replacement and aortoplasty 04/13/2011  . h/o Atrial fibrillation ([place] 03/22/2011  . S/P aortic valve replacement with stentless valve 03/18/2011  . Car[place]id artery disease ([place]   . Hypertension   . Chronic obstructive pulmonary disease ([place] 03/02/2011    Past Surgical History:  Procedure Laterality Date  . AORTIC  ROOT REPLACEMENT  03/18/2011   87m Medtronic Freestyle Porcine aortic root with reimplantation of coronary arteries  . BREAST LUMPECTOMY     Left  . CATARACT EXTRACTION    . CHOLECYSTECTOMY    . FOOT SURGERY     Morton's neuroma  . KNEE ARTHROSCOPY     Left    Current Outpatient Rx  . Order #: [id number]: Normal  . Order #: [id number]: Normal  . Order #: [id number]: Historical Med  . Order #: [id number]: Normal  . Order #: [id number]: Normal  . Order #: [id number]: Normal  . Order #:  [id number]: Historical Med  . Order #: [id number]: Historical Med  . Order #: [id number]: Normal  . Order #: [id number]: Normal  . Order #: [id number]: Normal  . Order #: [id number]: Normal  . Order #: [id number]: Print  . Order #: [id number]: Print    Allergies Clarithromycin, Tamsulosin, Trazodone and nefazodone, and Penicillins  Family History  Problem Relation Age of Onset  . Cancer Mother        bladder  . Lung cancer Father   . Heart disease Maternal Grandmother   . Emphysema Maternal Aunt   . Asthma Maternal Aunt   . Asthma Maternal Uncle   . Breast cancer Sister   . Uterine cancer Sister     Social History Social History   Tobacco Use  . Smoking status: Former Smoker    Packs/day: 0.90    Years: 62.00    Pack years: 55.80    Types: Cigarettes    Quit date: 07/14/2010    Years since quitting: 9.0  . Smokeless tobacco: Never Used  Substance Use Topics  . Alcohol use: No    Alcohol/week: 0.0 standard drinks  . Drug use: No    Review of Systems  All other systems negative except as documented in the HPI. All pertinent positives and negatives as reviewed in the HPI. ____________________________________________   PHYSICAL EXAM:  VITAL SIGNS: ED Triage Vitals [08/15/19 1556]  Enc Vitals Group     BP (!) 189/85     Pulse Rate 78     Resp 18     Temp 98.3 F (36.8 C)     Temp Source Oral     SpO2 92 %    Constitutional: Alert and oriented. Well appearing and in no acute distress. Eyes: Conjunctivae are normal. PERRL. EOMI. Head: Atraumatic. Nose: No congestion/rhinnorhea. Mouth/Throat: Mucous membranes are moist.  Oropharynx non-erythematous. Neck: No stridor.  No meningeal signs.   Cardiovascular: Normal rate, regular rhythm. Good peripheral circulation. Grossly normal heart sounds.   Respiratory: Normal respiratory effort.  No retractions. Lungs CTAB. Gastrointestinal: Soft and nontender. No distention.    Musculoskeletal: No lower extremity tenderness nor edema. No gross deformities of extremities. Neurologic:  Normal speech and language. No gross focal neurologic deficits are appreciated.  Skin:  Skin is warm, dry and intact. No rash noted.   ____________________________________________   LABS (all labs ordered are listed, but only abnormal results are displayed)  Labs Reviewed  BASIC METABOLIC PANEL - Abnormal; Notable for the following components:      Result Value   Potassium 5.3 (*)    Glucose, Bld 119 (*)    Creatinine, Ser 1.38 (*)    GFR calc non Af Amer 34 (*)    GFR calc Af Amer 40 (*)    All other components within normal limits  CBC  TROPONIN I (HIGH SENSITIVITY)  TROPONIN I (HIGH SENSITIVITY)  ____________________________________________  EKG   EKG Interpretation  Date/Time:  Tuesday August 15 2019 15:54:46 EDT Ventricular Rate:  101 PR Interval:  152 QRS Duration: 62 QT Interval:  376 QTC Calculation: 487 R Axis:   77 Text Interpretation: Sinus tachycardia Possible Left atrial enlargement Cannot rule out Anterior infarct , age undetermined Abnormal ECG ecg similar to and slightly improved from february tachycardia new Confirmed by Merrily Pew [phone]) on 08/15/2019 11:20:45 PM       ____________________________________________  RADIOLOGY  DG Chest 2 View  Result Date: 08/15/2019 CLINICAL DATA:  Chest pain and shortness of breath. EXAM: CHEST - 2 VIEW COMPARISON:  Chest radiographs and CTA 06/25/2019 FINDINGS: Previous sternotomy is again noted. The cardiomediastinal silhouette is unchanged with borderline cardiomegaly and chronic bilateral hilar prominence. The spiculated right lower lobe nodule on CT is not well seen radiographically. The lungs remain hyperinflated with calcified pleural plaques. No acute airspace consolidation, edema, pleural effusion, pneumothorax is identified. There is a chronic T5 compression fracture. IMPRESSION: Chronic lung  changes without evidence of acute abnormality. Electronically Signed   By: Logan Bores M.D.   On: 08/15/2019 16:49   CT T-SPINE NO CHARGE  Result Date: 08/16/2019 CLINICAL DATA:  Back pain EXAM: CT THORACIC AND LUMBAR SPINE WITHOUT CONTRAST TECHNIQUE: Multidetector CT imaging of the thoracic and lumbar spine was performed without contrast. Multiplanar CT image reconstructions were also generated. COMPARISON:  06/25/2019 FINDINGS: CT THORACIC SPINE FINDINGS Alignment: Normal alignment. Mildly exaggerated thoracic kyphosis. Vertebrae: No acute fracture. Mild T4 and T5 chronic height loss. Paraspinal and other soft tissues: Please see dedicated report for CTA of the chest. Disc levels: The right ventral spinal canal is mildly narrowed at the T11-12 level due to calcified disc protrusion in the right subarticular zone. CT LUMBAR SPINE FINDINGS Segmentation: Normal Alignment: Grade 1 anterolisthesis at L5-S1 is unchanged. There are bilateral pars interarticularis defects at L5. Vertebrae: Chronic mild height loss of L3 and L4 is unchanged. There is no acute fracture. Paraspinal and other soft tissues: Please see dedicated report for CTA of the abdomen and pelvis. Disc levels: The T12-L1 and L1-2 levels are unremarkable. L2-3: Mild spinal canal stenosis. L3-4: Moderate facet hypertrophy with no spinal canal stenosis. L4-5: Right asymmetric disc space narrowing with endplate spurring and mild right foraminal stenosis. L5-S1: Grade 1 anterolisthesis with moderate bilateral foraminal stenosis. IMPRESSION: 1. No acute fracture of the thoracic or lumbar spine. 2. Bilateral L5 pars interarticularis defects with grade 1 L5-S1 anterolisthesis, unchanged. Moderate bilateral L5-S1 foraminal stenosis. 3. Mild spinal canal stenosis at L2-3 and L4-5. Electronically Signed   By: Ulyses Jarred M.D.   On: 08/16/2019 03:58   CT L-SPINE NO CHARGE  Result Date: 08/16/2019 CLINICAL DATA:  Back pain EXAM: CT THORACIC AND LUMBAR SPINE  WITHOUT CONTRAST TECHNIQUE: Multidetector CT imaging of the thoracic and lumbar spine was performed without contrast. Multiplanar CT image reconstructions were also generated. COMPARISON:  06/25/2019 FINDINGS: CT THORACIC SPINE FINDINGS Alignment: Normal alignment. Mildly exaggerated thoracic kyphosis. Vertebrae: No acute fracture. Mild T4 and T5 chronic height loss. Paraspinal and other soft tissues: Please see dedicated report for CTA of the chest. Disc levels: The right ventral spinal canal is mildly narrowed at the T11-12 level due to calcified disc protrusion in the right subarticular zone. CT LUMBAR SPINE FINDINGS Segmentation: Normal Alignment: Grade 1 anterolisthesis at L5-S1 is unchanged. There are bilateral pars interarticularis defects at L5. Vertebrae: Chronic mild height loss of L3 and L4 is unchanged. There is no  acute fracture. Paraspinal and other soft tissues: Please see dedicated report for CTA of the abdomen and pelvis. Disc levels: The T12-L1 and L1-2 levels are unremarkable. L2-3: Mild spinal canal stenosis. L3-4: Moderate facet hypertrophy with no spinal canal stenosis. L4-5: Right asymmetric disc space narrowing with endplate spurring and mild right foraminal stenosis. L5-S1: Grade 1 anterolisthesis with moderate bilateral foraminal stenosis. IMPRESSION: 1. No acute fracture of the thoracic or lumbar spine. 2. Bilateral L5 pars interarticularis defects with grade 1 L5-S1 anterolisthesis, unchanged. Moderate bilateral L5-S1 foraminal stenosis. 3. Mild spinal canal stenosis at L2-3 and L4-5. Electronically Signed   By: Ulyses Jarred M.D.   On: 08/16/2019 03:58   CT Angio Chest/Abd/Pel for Dissection W and/or Wo Contrast  Result Date: 08/16/2019 CLINICAL DATA:  Chest pain for several hours EXAM: CT ANGIOGRAPHY CHEST, ABDOMEN AND PELVIS TECHNIQUE: Multidetector CT imaging through the chest, abdomen and pelvis was performed using the standard protocol during bolus administration of intravenous  contrast. Multiplanar reconstructed images and MIPs were obtained and reviewed to evaluate the vascular anatomy. CONTRAST:  56m OMNIPAQUE IOHEXOL 350 MG/ML SOLN COMPARISON:  Plain film from the previous day, CT from 06/25/2019 FINDINGS: CTA CHEST FINDINGS Cardiovascular: Thoracic aorta and its branches show evidence of atherosclerotic calcifications. No aneurysmal dilatation or dissection is noted. Heart is mildly enlarged in size. No coronary calcifications are noted. The pulmonary artery shows no definitive pulmonary emboli. No pericardial effusion is seen. Mediastinum/Nodes: Thoracic inlet is within normal limits. No hilar or mediastinal adenopathy is noted. The esophagus as visualized is within normal limits. Lungs/Pleura: Lungs are somewhat limited secondary to patient motion artifact. A focal lobular density is noted within the superior aspect of the right lower lobe measuring approximately 2.4 x 1.8 cm in greatest dimension. This is relatively stable from the prior exam and again suggestive of underlying malignancy. Calcified pleural plaques are noted particularly in the posterior aspect of the left hemithorax. No sizable effusion or pneumothorax is noted. No other parenchymal nodules are seen. Musculoskeletal: Degenerative changes of the thoracic spine are again seen. No rib abnormality is noted. Review of the MIP images confirms the above findings. CTA ABDOMEN AND PELVIS FINDINGS VASCULAR Aorta: Abdominal aorta demonstrates atherosclerotic calcifications without aneurysmal dilatation. Celiac: Patent without evidence of aneurysm, dissection, vasculitis or significant stenosis. SMA: Patent without evidence of aneurysm, dissection, vasculitis or significant stenosis. Renals: Single renal arteries are identified bilaterally. Mild stenosis is noted proximally stable from the prior study. IMA: Patent without evidence of aneurysm, dissection, vasculitis or significant stenosis. Iliacs: Mild stenosis of the  proximal aspects of the common iliac arteries bilaterally is seen. No other focal stenosis is noted. Veins: No specific venous abnormality is noted. Review of the MIP images confirms the above findings. NON-VASCULAR Hepatobiliary: No focal liver abnormality is seen. Status post cholecystectomy. No biliary dilatation. Pancreas: Unremarkable. No pancreatic ductal dilatation or surrounding inflammatory changes. Spleen: Normal in size without focal abnormality. Adrenals/Urinary Tract: Adrenal glands are within normal limits. The kidneys demonstrate a normal enhancement pattern bilaterally. 5 mm nonobstructing left renal stone is seen. Left renal cyst is noted stable in appearance. There is chronic right hydronephrosis identified without ureteral stone. The bladder is well distended. Stomach/Bowel: Mild retained fecal material is noted within the colon. No obstructive or inflammatory changes are seen. The appendix is within normal limits. No small bowel abnormality is noted. The stomach is within normal limits. Lymphatic: No significant lymphadenopathy is identified. Reproductive: Uterus and bilateral adnexa are unremarkable. Other: No abdominal wall  hernia or abnormality. No abdominopelvic ascites. Musculoskeletal: Pars defects are again seen at L5-S1. Stable anterolisthesis of L5 on S1 is seen. Chronic L3 compression deformity is noted stable from the prior study. Review of the MIP images confirms the above findings. IMPRESSION: No evidence of aortic dissection or aneurysmal dilatation. Spiculated mass is again noted in the right lower lobe suspicious for malignancy. Further evaluation is again recommended. Changes consistent with calcified pleural plaques stable from the prior exam. Right hydronephrosis without definitive stones stable from the prior study. Nonobstructing left renal stone. Mild stenosis in the proximal renal arteries bilaterally as well as in the proximal aspect of the common iliac arteries  bilaterally. No acute abnormality is noted. Electronically Signed   By: Inez Catalina M.D.   On: 08/16/2019 04:01    ____________________________________________  INITIAL IMPRESSION / ASSESSMENT AND PLAN / ED COURSE  Has a history of with likely undiagnosed right lower lobe cancer and atraumatic chest pain.  She is also hypertensive.  These concerns would be if she had a vertebral fracture causing nerve compression versus aortic dissection.  Plan for CT scan for same.  Symptomatic treatment the meantime.  Symptoms improved with muscle relaxers and pain medicine.  Given 1 dose of her Norvasc since she has been here for over 14 hours and is why her blood pressure was high.  PCP follow-up next week.     Pertinent labs & imaging results that were available during my care of the patient were reviewed by me and considered in my medical decision making (see chart for details).  A medical screening exam was performed and I feel the patient has had an appropriate workup for their chief complaint at this time and likelihood of emergent condition existing is low. They have been counseled on decision, discharge, follow up and which symptoms necessitate immediate return to the emergency department. They or their family verbally stated understanding and agreement with plan and discharged in stable condition.   ____________________________________________  FINAL CLINICAL IMPRESSION(S) / ED DIAGNOSES  Final diagnoses:  Fracture  Nonspecific chest pain     MEDICATIONS GIVEN DURING THIS VISIT:  Medications  sodium chloride flush (NS) 0.9 % injection 3 mL (has no administration in time range)  oxyCODONE-acetaminophen (PERCOCET/ROXICET) 5-325 MG per tablet 1 tablet (1 tablet Oral Given 08/16/19 0359)  iohexol (OMNIPAQUE) 350 MG/ML injection 75 mL (75 mLs Intravenous Contrast Given 08/16/19 0349)  methocarbamol (ROBAXIN) tablet 500 mg (500 mg Oral Given 08/16/19 0513)  predniSONE (DELTASONE) tablet 60 mg (60  mg Oral Given 08/16/19 0512)  amLODipine (NORVASC) tablet 5 mg (5 mg Oral Given 08/16/19 0527)     NEW OUTPATIENT MEDICATIONS STARTED DURING THIS VISIT:  Discharge Medication List as of 08/16/2019  5:58 AM    START taking these medications   Details  methocarbamol (ROBAXIN) 500 MG tablet Take 1 tablet (500 mg total) by mouth 2 (two) times daily., Starting Wed 08/16/2019, Print    oxyCODONE-acetaminophen (PERCOCET) 5-325 MG tablet Take 1 tablet by mouth every 4 (four) hours as needed., Starting Wed 08/16/2019, Print        Note:  This note was prepared with assistance of Dragon voice recognition software. Occasional wrong-word or sound-a-like substitutions may have occurred due to the inherent limitations of voice recognition software.   Rielyn Krupinski, Corene Cornea, MD 08/16/19 [phone]

## 2019-08-17 ENCOUNTER — Other Ambulatory Visit: Payer: Self-pay

## 2019-08-17 ENCOUNTER — Telehealth: Payer: Self-pay

## 2019-08-17 DIAGNOSIS — J438 Other emphysema: Secondary | ICD-10-CM | POA: Diagnosis not present

## 2019-08-17 DIAGNOSIS — J449 Chronic obstructive pulmonary disease, unspecified: Secondary | ICD-10-CM | POA: Diagnosis not present

## 2019-08-17 MED ORDER — OXYCODONE-ACETAMINOPHEN 5-325 MG PO TABS: 1.0000 | ORAL_TABLET | Freq: Four times a day (QID) | ORAL | 0 refills | Status: DC | PRN

## 2019-08-17 NOTE — Telephone Encounter (Signed)
Pt informed that RX has been sent to pharmacy for percocet.  Advised pt to bring physical RX to her appt on 08/21/2019 for destruction.  Pt expressed understanding and is agreeable.  Charyl Bigger, CMA

## 2019-08-17 NOTE — Telephone Encounter (Signed)
Pt called stating the ED gave her a physical script for Percocet on 08/16/2019 and pharmacy cannot accept a physical script, it has to be eprescribed.  Can you please send a new script via eprescribe to the pharmacy for this pt.?  She has a f/u scheduled on 08/21/2019.  Charyl Bigger, CMA

## 2019-08-17 NOTE — Telephone Encounter (Signed)
Pt called stating the ED gave her a physical script for Percocet on 08/16/2019 and pharmacy cannot accept a physical script, it has to be eprescribed.  Can you please send a new script via eprescribe to the pharmacy for this pt.?  She has a f/u scheduled on 08/21/2019.  T. Manasi Dishon, CMA 

## 2019-08-21 ENCOUNTER — Encounter: Payer: Self-pay | Admitting: Family Medicine

## 2019-08-21 ENCOUNTER — Other Ambulatory Visit: Payer: Self-pay

## 2019-08-21 ENCOUNTER — Ambulatory Visit (INDEPENDENT_AMBULATORY_CARE_PROVIDER_SITE_OTHER): Payer: Medicare Other | Admitting: Family Medicine

## 2019-08-21 VITALS — BP 151/72 | HR 80 | Temp 97.7°F | Ht 64.0 in | Wt 113.2 lb

## 2019-08-21 DIAGNOSIS — Z09 Encounter for follow-up examination after completed treatment for conditions other than malignant neoplasm: Secondary | ICD-10-CM | POA: Diagnosis not present

## 2019-08-21 DIAGNOSIS — R0789 Other chest pain: Secondary | ICD-10-CM | POA: Diagnosis not present

## 2019-08-21 DIAGNOSIS — M546 Pain in thoracic spine: Secondary | ICD-10-CM

## 2019-08-21 DIAGNOSIS — F341 Dysthymic disorder: Secondary | ICD-10-CM

## 2019-08-21 DIAGNOSIS — Z87891 Personal history of nicotine dependence: Secondary | ICD-10-CM | POA: Diagnosis not present

## 2019-08-21 DIAGNOSIS — F17200 Nicotine dependence, unspecified, uncomplicated: Secondary | ICD-10-CM

## 2019-08-21 MED ORDER — LIDOCAINE 5 % EX PTCH: 1.0000 | MEDICATED_PATCH | Freq: Two times a day (BID) | CUTANEOUS | 0 refills | Status: DC

## 2019-08-21 MED ORDER — BUPROPION HCL 75 MG PO TABS: 75.0000 mg | ORAL_TABLET | Freq: Two times a day (BID) | ORAL | 0 refills | Status: DC

## 2019-08-21 MED ORDER — OXYCODONE-ACETAMINOPHEN 5-325 MG PO TABS: 1.0000 | ORAL_TABLET | Freq: Four times a day (QID) | ORAL | 0 refills | Status: DC | PRN

## 2019-08-21 NOTE — Progress Notes (Signed)
Impression and Recommendations:    1. Hospital discharge follow-up   2. Left-sided thoracic back pain, unspecified chronicity   3. Acute chest wall pain   4. History of smoking   ( 43 yrs at 0.5ppd) ;1991- until 2012 ( 8 cig/day )     5. Dysthymia   6. h/o Smoking addiction     Hospital discharge follow-up - Acute Chest Wall Pain, L-Sided Thoracic Back Pain - Regarding pt's recent hospitalization and/or ED visit: reviewed in detail recent ED notes, clinical lab tests, tests in the radiology section of CPT, tests in the medicine testing of CPT, and obtained history from patient & family member.  - Reviewed patient's symptoms extensively during appointment, and engaged in lengthy discussion.  - STRONGLY advised patient to follow up with cardiologist sooner as needed if she develops any concerning sx  -  For treatment of acute pain, patient states she has been using percocet as prescribed prior. - No S-E or concerns; it works very well for her - takes and lies down, never up and moving around - Per patient, "I just use it very sparingly; sometimes I don't take one for 10 or 12 hours." - Refill of 30 provided today- RFs in future per pain mgt/ PM&R.  See med list.  - For relief of constipation, especially while using percocet, advised patient to continue prudent use of Miralax.  - Lidoderm patch prescribed today.  See med list. - Discussed use of lidocaine patches OTC if desired or if insurance does not cover option of Lidoderm patch.  - Discussed referral to PMR/ interventional pain management  to obtain evaluation with likely MRI as next step in eval, and who will provide pain management in future for pt.  - Ambulatory referral PM&R placed today.  See orders.  - Advised patient that if her known pulmonary nodule/spiculated mass is cancerous, her breathing and associated symptoms will continue to worsen. - STRONGLY encouraged patient to follow up with pulmonologist as  discussed.    Orders Placed This Encounter  Procedures  . Ambulatory referral to Physical Medicine Rehab    Meds ordered this encounter  Medications  . oxyCODONE-acetaminophen (PERCOCET/ROXICET) 5-325 MG tablet    Sig: Take 1 tablet by mouth every 6 (six) hours as needed for severe pain.    Dispense:  15 tablet    Refill:  0  . lidocaine (LIDODERM) 5 %    Sig: Place 1 patch onto the skin every 12 (twelve) hours. Remove & Discard patch within 12 hours or as directed    Dispense:  30 patch    Refill:  0  . buPROPion (WELLBUTRIN) 75 MG tablet    Sig: Take 1 tablet (75 mg total) by mouth 2 (two) times daily.    Dispense:  1 tablet    Refill:  0    Medications Discontinued During This Encounter  Medication Reason  . oxyCODONE-acetaminophen (PERCOCET/ROXICET) 5-325 MG tablet Reorder  . buPROPion (WELLBUTRIN) 75 MG tablet       Please see AVS handed out to patient at the end of our visit for further patient instructions/ counseling done pertaining to today's office visit.   Return for 6wks or so for check up on chest wall pain/ status.     Note:  This note was prepared with assistance of Dragon voice recognition software. Occasional wrong-word or sound-a-like substitutions may have occurred due to the inherent limitations of voice recognition software.   The [place]  Act was signed into law in 2016 which includes the topic of electronic health records.  This provides immediate access to information in MyChart.  This includes consultation notes, operative notes, office notes, lab results and pathology reports.  If you have any questions about what you read please let us know at your next visit or call us at the office.  We are right here with you.   This case required medical decision making of at least moderate complexity.  This document serves as a record of services personally performed by Mellody Dance, DO. It was created on her behalf by Toni Amend, a  trained medical scribe. The creation of this record is based on the scribe's personal observations and the provider's statements to them.    The above documentation from Toni Amend, medical scribe, has been reviewed by Marjory Sneddon, D.O.    --------------------------------------------------------------------------------------------------------------------------------------------------------------------------------------------------------------------------------------------    Subjective:     Phillips Odor, am serving as scribe for Dr.Dekota Shenk.   HPI: [name] is a 86 y.o. female who presents to [facility] today for issues as discussed below  --> Patient is here as a hospital/ED follow-up visit.  She presented to the emergency room on 08/15/2019 for "chest pain "  Patient confirms she is very uncomfortable at the start of the visit today, noting "I'm in pain."  Says there have been a couple of days she could not use her inhaler, "because it just hurt too badly."  She has not been checking her blood pressure recently.  Says "It's up and down and all around, never the same."  Believes if she averaged her blood pressure out, it would run in the 150's.  Says "I could not have gotten dressed this morning if I didn't have a prednisone."  - Current Pain Notes her pain "goes all the way through to my back."  Patient indicates the left side of her chest, wrapping around to the L1, L2 area.  Confirms that this is the same chest pain that sent her to the ED recently on 08/15/2019.  Patient states "I didn't call it chest pain, because it hurts here (indicates on the right side, all the way around to the left side of her back) when I inhale, and that ain't my chest is it?"  - Possible Incident of Onset She noticed the pain three weeks ago, when she was crocheting one night.    Says "my kitchen chairs have wooden arms," and when she dropped her  crochet hook, she leaned over to pick it up.  She thinks at that time "I overextended myself and felt something [pull], and thought I had fractured a rib or something."  After this happened, states "I just went on with it until I couldn't take it."  - Relief of Pain States she can't lay on her left side, and can't lay on her back.  Notes "I think [the pain is] coming from when I leaned over that arm, because it's hurt ever since."  Says the pain only just recently "got so bad I couldn't bear it."    If she lays down and doesn't move, she doesn't hurt; "if I just sit still, I don't hurt."  Confirms that she only experiences pain when she's moving.  For pain management, she has been taking percocet/oxycodone.  Notes "I only have two left."  The oxycodone relives the pain.  When she takes it, notes "I just lay down and go to  sleep."    Denies constipation.  Denies a dizzy/drunk feeling while managed on oxycodone because when she takes it, "I ain't moving around, I lay down."  Says that lately, she's been on the couch night and day, and "the only time I get up is to go to the bathroom or get something to drink."  Notes she also uses Biofreeze OTC to help manage her pain, confirming the relief just doesn't last that long.  - Bowel Movements Says sometimes she goes to the bathroom up to three times per day, with "just a little bit each time."  Patient notes "I have been this way since I got old."  - Spiculated Mass in R Lower Lobe of Lung During her ED visit, patient says she was told there was no change in the mass in her lungs.  On imaging, radiology stated to interval change in size/ appearance  Patient last went to the pulmonologist for follow-up on 07/24/2019.  Says "we have no plan [for treatment of the pulmonary mass], because regardless, there won't be anything done about it."  Says she was told by her doctor that treatment would be very painful and difficult, and if it was positive for cancer,  they couldn't do anything about it, and if it was negative, they would've gone through testings for nothing.  Denies that she would want anything done for it anyhow.  "I'm too old and have had my time"   Wt Readings from Last 3 Encounters:  08/21/19 113 lb 3.2 oz (51.3 kg)  07/24/19 113 lb 9.6 oz (51.5 kg)  07/04/19 116 lb (52.6 kg)   BP Readings from Last 3 Encounters:  08/21/19 (!) 151/72  08/16/19 (!) 182/88  07/24/19 108/66   Pulse Readings from Last 3 Encounters:  08/21/19 80  08/16/19 80  07/24/19 77   BMI Readings from Last 3 Encounters:  08/21/19 19.43 kg/m  07/24/19 19.50 kg/m  07/04/19 19.91 kg/m     Patient Care Team    Relationship Specialty Notifications Start End  Mellody Dance, DO PCP - General Family Medicine  10/08/15   Belva Crome, MD PCP - Cardiology Cardiology Admissions 07/18/17   Rexene Alberts, MD  Cardiothoracic Surgery  03/05/11   Belva Crome, MD  Cardiology  03/05/11   Collene Gobble, MD Consulting Physician Pulmonary Disease  11/05/15   Magdalen Spatz, NP Nurse Practitioner Pulmonary Disease  11/05/15   Brunetta Genera, MD Consulting Physician Hematology  07/07/19      Patient Active Problem List   Diagnosis Date Noted  . History of smoking   ( 43 yrs at 0.5ppd) ;1991- until 2012 ( 8 cig/day )   02/09/2017  . S/P aortic valve replacement with stentless valve 03/18/2011  . Carotid artery disease ([place])   . Hypertension   . Chronic obstructive pulmonary disease ([place]) 03/02/2011  . Osteopenia after menopause 07/07/2017  . Osteopenia 11/05/2015  . h/o Intention tremor  11/05/2015  . S/P aortic valve replacement and aortoplasty 04/13/2011  . Pulmonary nodule 1 cm or greater in diameter 07/24/2019  . Nonintractable headache 07/04/2019  . Suppurative otitis media of right ear 07/04/2019  . Hospital discharge follow-up 07/04/2019  . Hydronephrosis 06/26/2019  . Chest pain in adult 06/25/2019  . Hypertensive urgency   . History  of aortic valve replacement   . Hyperlipidemia LDL goal <70   . New daily persistent headache-  now resolved 02/28/2019  . Stage 3b chronic kidney disease 02/28/2019  .  Prediabetes 02/28/2019  . History of iron deficiency anemia 02/28/2019  . Urinary frequency 02/28/2019  . Dysthymia 02/28/2019  . Unintentional weight loss 02/09/2019  . Chronic respiratory failure with hypoxia ([place]) 02/09/2019  . Insomnia 04/28/2018  . Iron deficiency anemia 04/28/2018  . Estrogen deficiency 04/28/2018  . H. pylori infection w/ GERD sx 10/19/2017  . Essential hypertension 10/19/2017  . Medication monitoring encounter 07/07/2017  . Family history of breast cancer in sister 07/07/2017  . Family history of bladder cancer-in mother 07/07/2017  . Chest pain 07/07/2017  . Elevated hemoglobin A1c 11/13/2015  . Hematuria 11/05/2015  . Hypothyroidism; on synthroid 11/05/2015  . h/o Hypercalcemia 11/05/2015  . h/o RLS (restless legs syndrome) 11/05/2015  . History of nephrolithiasis 11/05/2015  . h/o Atrial fibrillation ([place]) 03/22/2011    Past Medical history, Surgical history, Family history, Social history, Allergies and Medications have been entered into the medical record, reviewed and changed as needed.    Current Meds  Medication Sig  . albuterol (PROAIR HFA) 108 (90 Base) MCG/ACT inhaler Inhale 2 puffs into the lungs every 6 (six) hours as needed for wheezing or shortness of breath.  Marland Kitchen amLODipine (NORVASC) 2.5 MG tablet Take 1 tablet (2.5 mg total) by mouth daily.  Marland Kitchen aspirin 81 MG tablet Take 81 mg by mouth daily.  Marland Kitchen buPROPion (WELLBUTRIN) 75 MG tablet Take 1 tablet (75 mg total) by mouth 2 (two) times daily.  . isosorbide mononitrate (IMDUR) 30 MG 24 hr tablet Take 0.5 tablets (15 mg total) by mouth daily.  Marland Kitchen levothyroxine (SYNTHROID) 112 MCG tablet Take 1 tablet (112 mcg total) by mouth daily.  . Melatonin 10 MG TABS Take 10 mg by mouth at bedtime.   . methocarbamol (ROBAXIN) 500 MG tablet  Take 1 tablet (500 mg total) by mouth 2 (two) times daily.  . Omega-3 Fatty Acids (FISH OIL) 1000 MG CAPS Take 1,000 mg by mouth daily.  Marland Kitchen oxyCODONE-acetaminophen (PERCOCET/ROXICET) 5-325 MG tablet Take 1 tablet by mouth every 6 (six) hours as needed for severe pain.  . pantoprazole (PROTONIX) 40 MG tablet Take 1 tablet (40 mg total) by mouth 2 (two) times daily. (Patient taking differently: Take 40 mg by mouth daily as needed (reflux). )  . predniSONE (DELTASONE) 5 MG tablet Take 1 tablet by mouth once daily with breakfast (Patient taking differently: Take 5 mg by mouth daily with breakfast. )  . rosuvastatin (CRESTOR) 20 MG tablet Take 1 tablet (20 mg total) by mouth daily at 6 PM.  . STIOLTO RESPIMAT 2.5-2.5 MCG/ACT AERS INHALE 2 PUFFS BY MOUTH ONCE DAILY. PLEASE SEE DR FOR REFILLS (Patient taking differently: Inhale 2 puffs into the lungs daily. )  . [DISCONTINUED] buPROPion (WELLBUTRIN) 75 MG tablet 1/2 tablet twice daily for 2 weeks then 1 full tablet twice daily (Patient taking differently: Take 75 mg by mouth 2 (two) times daily. )  . [DISCONTINUED] oxyCODONE-acetaminophen (PERCOCET/ROXICET) 5-325 MG tablet Take 1 tablet by mouth every 6 (six) hours as needed for severe pain.    Allergies:  Allergies  Allergen Reactions  . Clarithromycin     Mouth/throat sore  . Tamsulosin Itching  . Trazodone And Nefazodone     Dizziness   . Penicillins Itching and Rash    Did it involve swelling of the face/tongue/throat, SOB, or low BP? No Did it involve sudden or severe rash/hives, skin peeling, or any reaction on the inside of your mouth or nose? Yes Did you need to seek medical attention at a  hospital or doctor's office? Yes When did it last happen?Young woman  If all above answers are "NO", may proceed with cephalosporin use.      Review of Systems:  A fourteen system review of systems was performed and found to be positive as per HPI.   Objective:   Blood pressure (!) 151/72,  pulse 80, temperature 97.7 F (36.5 C), temperature source Oral, height _0  (1.626 m), weight 113 lb 3.2 oz (51.3 kg), SpO2 96 %. Body mass index is 19.43 kg/m.  General:  Well Developed, well nourished, appropriate for stated age.  Neuro:  Alert and oriented,  extra-ocular muscles intact  HEENT:  Normocephalic, atraumatic, neck supple, no carotid bruits appreciated  Skin:  no gross rash, warm, pink. Cardiac:  RRR, S1 S2 Respiratory:  ECTA B/L and A/P but distant breath sounds, Not using accessory muscles, speaking in full sentences- essentially unlabored/unchanged from prior. Vascular:  Ext warm, no cyanosis apprec.; cap RF less 2 sec. Psych:  No HI/SI, judgement and insight good, Euthymic mood. Full Affect. Back:  during appointment, patient indicates pain in the left side of the chest, wrapping around to the L1, L2 area.  Patient with no point tenderness, no bony tenderness over the spine or the ribs nor the manubrium. No rash or bruise present.     - All Recent imaging from 08/15/2010 and 08/16/2019 I personally reviewed today:  DG CHEST 2 VIEW CLINICAL DATA:  Chest pain and shortness of breath.  EXAM: CHEST - 2 VIEW  COMPARISON:  Chest radiographs and CTA 06/25/2019  FINDINGS: Previous sternotomy is again noted. The cardiomediastinal silhouette is unchanged with borderline cardiomegaly and chronic bilateral hilar prominence. The spiculated right lower lobe nodule on CT is not well seen radiographically. The lungs remain hyperinflated with calcified pleural plaques. No acute airspace consolidation, edema, pleural effusion, pneumothorax is identified. There is a chronic T5 compression fracture.  IMPRESSION: Chronic lung changes without evidence of acute abnormality.   Electronically Signed   By: Logan Bores M.D.   On: 08/15/2019 16:49   CT ANGIOGRAPHY CHEST, ABDOMEN AND PELVIS CLINICAL DATA:  Chest pain for several hours  EXAM: CT ANGIOGRAPHY CHEST, ABDOMEN  AND PELVIS  TECHNIQUE: Multidetector CT imaging through the chest, abdomen and pelvis was performed using the standard protocol during bolus administration of intravenous contrast. Multiplanar reconstructed images and MIPs were obtained and reviewed to evaluate the vascular anatomy.  CONTRAST:  60m OMNIPAQUE IOHEXOL 350 MG/ML SOLN  COMPARISON:  Plain film from the previous day, CT from 06/25/2019  FINDINGS: CTA CHEST FINDINGS  Cardiovascular: Thoracic aorta and its branches show evidence of atherosclerotic calcifications. No aneurysmal dilatation or dissection is noted. Heart is mildly enlarged in size. No coronary calcifications are noted. The pulmonary artery shows no definitive pulmonary emboli. No pericardial effusion is seen.  Mediastinum/Nodes: Thoracic inlet is within normal limits. No hilar or mediastinal adenopathy is noted. The esophagus as visualized is within normal limits.  Lungs/Pleura: Lungs are somewhat limited secondary to patient motion artifact. A focal lobular density is noted within the superior aspect of the right lower lobe measuring approximately 2.4 x 1.8 cm in greatest dimension. This is relatively stable from the prior exam and again suggestive of underlying malignancy. Calcified pleural plaques are noted particularly in the posterior aspect of the left hemithorax. No sizable effusion or pneumothorax is noted. No other parenchymal nodules are seen.  Musculoskeletal: Degenerative changes of the thoracic spine are again seen. No rib abnormality is noted.  Review  of the MIP images confirms the above findings.  CTA ABDOMEN AND PELVIS FINDINGS  VASCULAR  Aorta: Abdominal aorta demonstrates atherosclerotic calcifications without aneurysmal dilatation.  Celiac: Patent without evidence of aneurysm, dissection, vasculitis or significant stenosis.  SMA: Patent without evidence of aneurysm, dissection, vasculitis or significant  stenosis.  Renals: Single renal arteries are identified bilaterally. Mild stenosis is noted proximally stable from the prior study.  IMA: Patent without evidence of aneurysm, dissection, vasculitis or significant stenosis.  Iliacs: Mild stenosis of the proximal aspects of the common iliac arteries bilaterally is seen. No other focal stenosis is noted.  Veins: No specific venous abnormality is noted.  Review of the MIP images confirms the above findings.  NON-VASCULAR  Hepatobiliary: No focal liver abnormality is seen. Status post cholecystectomy. No biliary dilatation.  Pancreas: Unremarkable. No pancreatic ductal dilatation or surrounding inflammatory changes.  Spleen: Normal in size without focal abnormality.  Adrenals/Urinary Tract: Adrenal glands are within normal limits. The kidneys demonstrate a normal enhancement pattern bilaterally. 5 mm nonobstructing left renal stone is seen. Left renal cyst is noted stable in appearance. There is chronic right hydronephrosis identified without ureteral stone. The bladder is well distended.  Stomach/Bowel: Mild retained fecal material is noted within the colon. No obstructive or inflammatory changes are seen. The appendix is within normal limits. No small bowel abnormality is noted. The stomach is within normal limits.  Lymphatic: No significant lymphadenopathy is identified.  Reproductive: Uterus and bilateral adnexa are unremarkable.  Other: No abdominal wall hernia or abnormality. No abdominopelvic ascites.  Musculoskeletal: Pars defects are again seen at L5-S1. Stable anterolisthesis of L5 on S1 is seen. Chronic L3 compression deformity is noted stable from the prior study.  Review of the MIP images confirms the above findings.  IMPRESSION: No evidence of aortic dissection or aneurysmal dilatation.  Spiculated mass is again noted in the right lower lobe suspicious for malignancy. Further evaluation  is again recommended.  Changes consistent with calcified pleural plaques stable from the prior exam.  Right hydronephrosis without definitive stones stable from the prior study.  Nonobstructing left renal stone.  Mild stenosis in the proximal renal arteries bilaterally as well as in the proximal aspect of the common iliac arteries bilaterally.  No acute abnormality is noted.   Electronically Signed   By: Inez Catalina M.D.   On: 08/16/2019 04:01   CT L-SPINE NO CHARGE CLINICAL DATA:  Back pain  EXAM: CT THORACIC AND LUMBAR SPINE WITHOUT CONTRAST  TECHNIQUE: Multidetector CT imaging of the thoracic and lumbar spine was performed without contrast. Multiplanar CT image reconstructions were also generated.  COMPARISON:  06/25/2019  FINDINGS: CT THORACIC SPINE FINDINGS  Alignment: Normal alignment. Mildly exaggerated thoracic kyphosis.  Vertebrae: No acute fracture. Mild T4 and T5 chronic height loss.  Paraspinal and other soft tissues: Please see dedicated report for CTA of the chest.  Disc levels: The right ventral spinal canal is mildly narrowed at the T11-12 level due to calcified disc protrusion in the right subarticular zone.  CT LUMBAR SPINE FINDINGS  Segmentation: Normal  Alignment: Grade 1 anterolisthesis at L5-S1 is unchanged. There are bilateral pars interarticularis defects at L5.  Vertebrae: Chronic mild height loss of L3 and L4 is unchanged. There is no acute fracture.  Paraspinal and other soft tissues: Please see dedicated report for CTA of the abdomen and pelvis.  Disc levels: The T12-L1 and L1-2 levels are unremarkable.  L2-3: Mild spinal canal stenosis.  L3-4: Moderate facet hypertrophy with no spinal  canal stenosis.  L4-5: Right asymmetric disc space narrowing with endplate spurring and mild right foraminal stenosis.  L5-S1: Grade 1 anterolisthesis with moderate bilateral  foraminal stenosis.  IMPRESSION: 1. No acute fracture of the thoracic or lumbar spine. 2. Bilateral L5 pars interarticularis defects with grade 1 L5-S1 anterolisthesis, unchanged. Moderate bilateral L5-S1 foraminal stenosis. 3. Mild spinal canal stenosis at L2-3 and L4-5.   Electronically Signed   By: Ulyses Jarred M.D.   On: 08/16/2019 03:58

## 2019-08-23 ENCOUNTER — Encounter: Payer: Self-pay | Admitting: Physical Medicine and Rehabilitation

## 2019-08-24 ENCOUNTER — Ambulatory Visit: Payer: Medicare Other | Admitting: Family Medicine

## 2019-08-27 ENCOUNTER — Other Ambulatory Visit: Payer: Self-pay

## 2019-08-27 ENCOUNTER — Emergency Department (HOSPITAL_COMMUNITY)
Admission: EM | Admit: 2019-08-27 | Discharge: 2019-08-28 | Disposition: A | Discharge status: Home / Self Care | Payer: Medicare Other | Attending: Emergency Medicine | Admitting: Emergency Medicine

## 2019-08-27 ENCOUNTER — Emergency Department (HOSPITAL_COMMUNITY): Payer: Medicare Other

## 2019-08-27 ENCOUNTER — Encounter (HOSPITAL_COMMUNITY): Payer: Self-pay | Admitting: Emergency Medicine

## 2019-08-27 DIAGNOSIS — N390 Urinary tract infection, site not specified: Secondary | ICD-10-CM | POA: Diagnosis not present

## 2019-08-27 DIAGNOSIS — Z7982 Long term (current) use of aspirin: Secondary | ICD-10-CM | POA: Insufficient documentation

## 2019-08-27 DIAGNOSIS — I10 Essential (primary) hypertension: Secondary | ICD-10-CM | POA: Diagnosis not present

## 2019-08-27 DIAGNOSIS — Z954 Presence of other heart-valve replacement: Secondary | ICD-10-CM | POA: Insufficient documentation

## 2019-08-27 DIAGNOSIS — I2510 Atherosclerotic heart disease of native coronary artery without angina pectoris: Secondary | ICD-10-CM | POA: Insufficient documentation

## 2019-08-27 DIAGNOSIS — Z87891 Personal history of nicotine dependence: Secondary | ICD-10-CM | POA: Diagnosis not present

## 2019-08-27 DIAGNOSIS — Z743 Need for continuous supervision: Secondary | ICD-10-CM | POA: Diagnosis not present

## 2019-08-27 DIAGNOSIS — Z79899 Other long term (current) drug therapy: Secondary | ICD-10-CM | POA: Diagnosis not present

## 2019-08-27 DIAGNOSIS — R000 Tachycardia, unspecified: Secondary | ICD-10-CM | POA: Diagnosis not present

## 2019-08-27 DIAGNOSIS — R1032 Left lower quadrant pain: Secondary | ICD-10-CM | POA: Diagnosis not present

## 2019-08-27 DIAGNOSIS — E039 Hypothyroidism, unspecified: Secondary | ICD-10-CM | POA: Diagnosis not present

## 2019-08-27 DIAGNOSIS — N1330 Unspecified hydronephrosis: Secondary | ICD-10-CM | POA: Diagnosis not present

## 2019-08-27 DIAGNOSIS — R1030 Lower abdominal pain, unspecified: Secondary | ICD-10-CM | POA: Diagnosis not present

## 2019-08-27 DIAGNOSIS — R109 Unspecified abdominal pain: Secondary | ICD-10-CM | POA: Diagnosis present

## 2019-08-27 DIAGNOSIS — R1084 Generalized abdominal pain: Secondary | ICD-10-CM | POA: Diagnosis not present

## 2019-08-27 LAB — COMPREHENSIVE METABOLIC PANEL
ALT: 22 U/L (ref 0–44)
AST: 22 U/L (ref 15–41)
Albumin: 3.2 g/dL — ABNORMAL LOW (ref 3.5–5.0)
Alkaline Phosphatase: 76 U/L (ref 38–126)
Anion gap: 10 (ref 5–15)
BUN: 22 mg/dL (ref 8–23)
CO2: 27 mmol/L (ref 22–32)
Calcium: 9.4 mg/dL (ref 8.9–10.3)
Chloride: 99 mmol/L (ref 98–111)
Creatinine, Ser: 1.29 mg/dL — ABNORMAL HIGH (ref 0.44–1.00)
GFR calc Af Amer: 43 mL/min — ABNORMAL LOW (ref >60)
GFR calc non Af Amer: 37 mL/min — ABNORMAL LOW (ref >60)
Glucose, Bld: 124 mg/dL — ABNORMAL HIGH (ref 70–99)
Potassium: 4.7 mmol/L (ref 3.5–5.1)
Sodium: 136 mmol/L (ref 135–145)
Total Bilirubin: 0.4 mg/dL (ref 0.3–1.2)
Total Protein: 6.9 g/dL (ref 6.5–8.1)

## 2019-08-27 LAB — CBC
HCT: 45.4 % (ref 36.0–46.0)
Hemoglobin: 14.4 g/dL (ref 12.0–15.0)
MCH: 31.2 pg (ref 26.0–34.0)
MCHC: 31.7 g/dL (ref 30.0–36.0)
MCV: 98.5 fL (ref 80.0–100.0)
Platelets: 193 10*3/uL (ref 150–400)
RBC: 4.61 MIL/uL (ref 3.87–5.11)
RDW: 13.1 % (ref 11.5–15.5)
WBC: 7.2 10*3/uL (ref 4.0–10.5)
nRBC: 0 % (ref 0.0–0.2)

## 2019-08-27 LAB — URINALYSIS, ROUTINE W REFLEX MICROSCOPIC
Bilirubin Urine: NEGATIVE
Glucose, UA: NEGATIVE mg/dL
Ketones, ur: NEGATIVE mg/dL
Nitrite: NEGATIVE
Protein, ur: NEGATIVE mg/dL
Specific Gravity, Urine: 1.009 (ref 1.005–1.030)
WBC, UA: >50 WBC/hpf — ABNORMAL HIGH (ref 0–5)
pH: 6 (ref 5.0–8.0)

## 2019-08-27 LAB — LIPASE, BLOOD: Lipase: 24 U/L (ref 11–51)

## 2019-08-27 MED ORDER — SODIUM CHLORIDE 0.9% FLUSH: 3.0000 mL | Freq: Once | INTRAVENOUS | Status: DC

## 2019-08-27 MED ORDER — OXYCODONE-ACETAMINOPHEN 5-325 MG PO TABS: 1.0000 | ORAL_TABLET | Freq: Four times a day (QID) | ORAL | 0 refills | Status: AC | PRN

## 2019-08-27 MED ORDER — CEPHALEXIN 500 MG PO CAPS: 500.0000 mg | ORAL_CAPSULE | Freq: Two times a day (BID) | ORAL | 0 refills | Status: DC

## 2019-08-27 MED ORDER — FENTANYL CITRATE (PF) 100 MCG/2ML IJ SOLN
50.0000 ug | Freq: Once | INTRAMUSCULAR | Status: AC
  Administered 2019-08-27: 22:00:00 50 ug via INTRAVENOUS
  Filled 2019-08-27: qty 2

## 2019-08-27 MED ORDER — OXYCODONE-ACETAMINOPHEN 5-325 MG PO TABS
1.0000 | ORAL_TABLET | Freq: Once | ORAL | Status: AC
  Administered 2019-08-28: 1 via ORAL
  Filled 2019-08-27: qty 1

## 2019-08-27 NOTE — ED Notes (Signed)
Urine culture also sent to lab.

## 2019-08-27 NOTE — ED Triage Notes (Signed)
Pt to triage via [facility] from home.  Reports LLQ pain x 2 weeks that radiates to L flank.  CBG 126.  Taking Oxycodone without relief.  Denies nausea, vomiting, or diarrhea.  Denies urinary complaints.  Wears 2L O2 at home.

## 2019-08-27 NOTE — ED Provider Notes (Signed)
[facility] EMERGENCY DEPARTMENT Provider Note   CSN: [id number] Arrival date & time: 08/27/19  1713     History Chief Complaint  Patient presents with  . Abdominal Pain    [name] is a 86 y.o. female.  HPI 86 year old female with past medical history of CAD, A. fib on ASA only, HTN, hypothyroidism, previous aortic root replacement in 2012, presented to the emerge department brought in by EMS from home complaining of left lower quadrant abdominal pain radiating to left flank for 2 weeks.  Pain is mildly improved with oxycodone and a lidocaine patch.  Patient denies any nausea, vomiting, diarrhea.  She also denies urinary complaints.  Patient wears 2 L oxygen at baseline. The patient reports that she had an injury after she dropped her crochet needle 6 weeks ago and overextended herself to try and pick up the needle.  Since then she has been having left-sided pain.  Patient also noted that she was mowing her lawn 2 weeks ago and the next day woke up with much worsened pain on the left side.  The patient describes the pain as achy when she is sitting still and when she moves it becomes a sharp pain to the left side.  Patient denies any hematuria.  Patient states that a previous CT scan noted a kidney stone on her left side but she denies previous history of kidney stones.  Patient has not had any fevers, chills, cough, congestion, chest pain, shortness of breath or new trauma to the left side.    Past Medical History:  Diagnosis Date  . Allergic rhinitis   . Aortic stenosis   . Carotid artery disease ([place])   . Colon polyps   . h/o Atrial fibrillation ([place]) 03/22/2011   Brief post op   . Hematuria 11/05/2015  . Hypercalcemia   . Hypertension   . Hypothyroid   . Osteopenia   . 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 Pressure urticaria   . RLS (restless legs syndrome)   . S/P aortic valve replacement and aortoplasty 04/13/2011   68m Medtronic Freestyle porcine aortic root    . S/P aortic valve replacement with stentless valve 03/18/2011    Patient Active Problem List   Diagnosis Date Noted  . Pulmonary nodule 1 cm or greater in diameter 07/24/2019  . Nonintractable headache 07/04/2019  . Suppurative otitis media of right ear 07/04/2019  . Hospital discharge follow-up 07/04/2019  . Hydronephrosis 06/26/2019  . Chest pain in adult 06/25/2019  . Hypertensive urgency   . History of aortic valve replacement   . Hyperlipidemia LDL goal <70   . New daily persistent headache-  now resolved 02/28/2019  . Stage 3b chronic kidney disease 02/28/2019  . Prediabetes 02/28/2019  . History of iron deficiency anemia 02/28/2019  . Urinary frequency 02/28/2019  . Dysthymia 02/28/2019  . Unintentional weight loss 02/09/2019  . Chronic respiratory failure with hypoxia ([place] 02/09/2019  . Insomnia 04/28/2018  . Iron deficiency anemia 04/28/2018  . Estrogen deficiency 04/28/2018  . H. pylori infection w/ GERD sx 10/19/2017  . Essential hypertension 10/19/2017  . Medication monitoring encounter 07/07/2017  . Family history of breast cancer in sister 07/07/2017  . Family history of bladder cancer-in mother 07/07/2017  . Osteopenia after menopause 07/07/2017  . Chest pain 07/07/2017  . History of smoking   ( 43 yrs at 0.5ppd) ;1991- until 2012 ( 8 cig/day )   02/09/2017  . Elevated hemoglobin A1c 11/13/2015  . Hematuria 11/05/2015  .  Hypothyroidism; on synthroid 11/05/2015  . Osteopenia 11/05/2015  . h/o Hypercalcemia 11/05/2015  . h/o RLS (restless legs syndrome) 11/05/2015  . h/o Intention tremor  11/05/2015  . History of nephrolithiasis 11/05/2015  . S/P aortic valve replacement and aortoplasty 04/13/2011  . h/o Atrial fibrillation ([place]) 03/22/2011  . S/P aortic valve replacement with stentless valve 03/18/2011  . Carotid artery disease ([place])   . Hypertension   . Chronic obstructive pulmonary disease ([place]) 03/02/2011    Past Surgical History:  Procedure  Laterality Date  . AORTIC ROOT REPLACEMENT  03/18/2011   86m Medtronic Freestyle Porcine aortic root with reimplantation of coronary arteries  . BREAST LUMPECTOMY     Left  . CATARACT EXTRACTION    . CHOLECYSTECTOMY    . FOOT SURGERY     Morton's neuroma  . KNEE ARTHROSCOPY     Left     OB History   No obstetric history on file.     Family History  Problem Relation Age of Onset  . Cancer Mother        bladder  . Lung cancer Father   . Heart disease Maternal Grandmother   . Emphysema Maternal Aunt   . Asthma Maternal Aunt   . Asthma Maternal Uncle   . Breast cancer Sister   . Uterine cancer Sister     Social History   Tobacco Use  . Smoking status: Former Smoker    Packs/day: 0.90    Years: 62.00    Pack years: 55.80    Types: Cigarettes    Quit date: 07/14/2010    Years since quitting: 9.1  . Smokeless tobacco: Never Used  Substance Use Topics  . Alcohol use: No    Alcohol/week: 0.0 standard drinks  . Drug use: No    Home Medications Prior to Admission medications   Medication Sig Start Date End Date Taking? Authorizing Provider  albuterol (PROAIR HFA) 108 (90 Base) MCG/ACT inhaler Inhale 2 puffs into the lungs every 6 (six) hours as needed for wheezing or shortness of breath. 10/21/16  Yes BCollene Gobble MD  amLODipine (NORVASC) 2.5 MG tablet Take 1 tablet (2.5 mg total) by mouth daily. 06/27/19  Yes Black, KLezlie Octave NP  aspirin EC 81 MG tablet Take 81 mg by mouth daily.   Yes [provider]  buPROPion (WELLBUTRIN) 75 MG tablet Take 1 tablet (75 mg total) by mouth 2 (two) times daily. 08/21/19  Yes Opalski, DNeoma Laming DO  levothyroxine (SYNTHROID) 112 MCG tablet Take 1 tablet (112 mcg total) by mouth daily. 07/25/19  Yes Opalski, Deborah, DO  lidocaine (LIDODERM) 5 % Place 1 patch onto the skin every 12 (twelve) hours. Remove & Discard patch within 12 hours or as directed 08/21/19  Yes Opalski, Deborah, DO  MELATONIN PO Take 1 tablet by mouth at bedtime.     Yes [provider]  Omega-3 Fatty Acids (FISH OIL) 1000 MG CAPS Take 1,000 mg by mouth daily.   Yes [provider]  OXYGEN Inhale 2 L into the lungs continuous.   Yes [provider]  pantoprazole (PROTONIX) 40 MG tablet Take 1 tablet (40 mg total) by mouth 2 (two) times daily. Patient taking differently: Take 40 mg by mouth daily as needed (reflux).  06/26/19  Yes Black, KLezlie Octave NP  predniSONE (DELTASONE) 5 MG tablet Take 1 tablet by mouth once daily with breakfast Patient taking differently: Take 5 mg by mouth daily with breakfast.  07/24/19  Yes Opalski, DNeoma Laming DO  rosuvastatin (CRESTOR) 20 MG tablet Take 1 tablet (20 mg total) by mouth daily at 6 PM. 06/26/19  Yes Black, Lezlie Octave, NP  STIOLTO RESPIMAT 2.5-2.5 MCG/ACT AERS INHALE 2 PUFFS BY MOUTH ONCE DAILY. PLEASE SEE DR FOR REFILLS Patient taking differently: Inhale 2 puffs into the lungs daily.  02/27/19  Yes Collene Gobble, MD  cephALEXin (KEFLEX) 500 MG capsule Take 1 capsule (500 mg total) by mouth 2 (two) times daily. 08/27/19   Little, Wenda Overland, MD  isosorbide mononitrate (IMDUR) 30 MG 24 hr tablet Take 0.5 tablets (15 mg total) by mouth daily. Patient not taking: Reported on 08/27/2019 06/27/19   Radene Gunning, NP  methocarbamol (ROBAXIN) 500 MG tablet Take 1 tablet (500 mg total) by mouth 2 (two) times daily. Patient not taking: Reported on 08/27/2019 08/16/19   Mesner, Corene Cornea, MD  oxyCODONE-acetaminophen (PERCOCET) 5-325 MG tablet Take 1 tablet by mouth every 6 (six) hours as needed for up to 5 days. 08/27/19 09/01/19  Little, Wenda Overland, MD    Allergies    Clarithromycin, Tamsulosin, Trazodone and nefazodone, and Penicillins  Review of Systems   Review of Systems  Constitutional: Negative for activity change, appetite change, chills, diaphoresis, fatigue and fever.  HENT: Negative for congestion and rhinorrhea.   Respiratory: Negative for cough, shortness of breath and wheezing.   Cardiovascular:  Negative for chest pain.  Gastrointestinal: Positive for abdominal pain. Negative for abdominal distention, diarrhea, nausea and vomiting.  Genitourinary: Positive for flank pain. Negative for decreased urine volume, difficulty urinating, dysuria, frequency and urgency.  Musculoskeletal: Negative for gait problem.  Skin: Negative for color change and wound.  Neurological: Negative for dizziness, syncope, weakness, light-headedness and headaches.  All other systems reviewed and are negative.   Physical Exam Updated Vital Signs BP (!) 152/61   Pulse (!) 37   Temp 98.4 F (36.9 C) (Oral)   Resp 17   SpO2 97%   Physical Exam Vitals and nursing note reviewed.  Constitutional:      General: She is not in acute distress.    Appearance: Normal appearance. She is normal weight. She is not ill-appearing.  HENT:     Head: Normocephalic.     Right Ear: External ear normal.     Left Ear: External ear normal.     Nose: Nose normal.     Mouth/Throat:     Mouth: Mucous membranes are moist.     Pharynx: Oropharynx is clear.  Eyes:     Extraocular Movements: Extraocular movements intact.  Cardiovascular:     Rate and Rhythm: Normal rate and regular rhythm.     Pulses: Normal pulses.     Heart sounds: Normal heart sounds.  Pulmonary:     Effort: Pulmonary effort is normal. No respiratory distress.     Breath sounds: Normal breath sounds. No wheezing or rhonchi.  Abdominal:     General: Bowel sounds are normal.     Palpations: Abdomen is soft.     Tenderness: There is no abdominal tenderness. There is no right CVA tenderness, left CVA tenderness, guarding or rebound. Negative signs include Murphy's sign.  Musculoskeletal:     Cervical back: Normal range of motion.     Right lower leg: No edema.     Left lower leg: No edema.  Skin:    General: Skin is warm and dry.     Capillary Refill: Capillary refill takes less than 2 seconds.  Neurological:     General: No focal  deficit present.      Mental Status: She is alert and oriented to person, place, and time. Mental status is at baseline.  Psychiatric:        Mood and Affect: Mood normal.     ED Results / Procedures / Treatments   Labs (all labs ordered are listed, but only abnormal results are displayed) Labs Reviewed  COMPREHENSIVE METABOLIC PANEL - Abnormal; Notable for the following components:      Result Value   Glucose, Bld 124 (*)    Creatinine, Ser 1.29 (*)    Albumin 3.2 (*)    GFR calc non Af Amer 37 (*)    GFR calc Af Amer 43 (*)    All other components within normal limits  URINALYSIS, ROUTINE W REFLEX MICROSCOPIC - Abnormal; Notable for the following components:   APPearance HAZY (*)    Hgb urine dipstick SMALL (*)    Leukocytes,Ua LARGE (*)    WBC, UA >50 (*)    Bacteria, UA FEW (*)    All other components within normal limits  LIPASE, BLOOD  CBC    EKG None  Radiology CT Renal Stone Study  Result Date: 08/27/2019 CLINICAL DATA:  Left lower quadrant pain. EXAM: CT ABDOMEN AND PELVIS WITHOUT CONTRAST TECHNIQUE: Multidetector CT imaging of the abdomen and pelvis was performed following the standard protocol without IV contrast. COMPARISON:  August 16, 2019 FINDINGS: Lower chest: Moderate-severity pleural base calcification is seen within the left lung base. Hepatobiliary: No focal liver abnormality is seen. Status post cholecystectomy. No biliary dilatation. Pancreas: A 2.0 cm x 1.8 cm area of heterogeneous low attenuation is seen along the junction of the body and head of the pancreas. Spleen: Normal in size without focal abnormality. Adrenals/Urinary Tract: Adrenal glands are unremarkable. Kidneys are normal in size without focal lesions. There is stable marked severity right-sided hydronephrosis with marked severity prominence of the intrarenal calices of the right kidney. A 5 mm nonobstructing renal stone is seen within the lower pole of the right kidney. A 6 mm nonobstructing renal stone is seen  within the lower pole of the left kidney. Bladder is unremarkable. Stomach/Bowel: Stomach is within normal limits. The appendix is not clearly identified. A large amount of stool is seen throughout the colon. No evidence of bowel dilatation. Vascular/Lymphatic: Marked severity aortic calcification. No enlarged abdominal or pelvic lymph nodes. Reproductive: Uterus and bilateral adnexa are unremarkable. Other: No abdominal wall hernia or abnormality. No abdominopelvic ascites. Musculoskeletal: Multilevel degenerative changes seen throughout the lumbar spine. IMPRESSION: 1. Stable marked severity right-sided hydronephrosis, with marked severity prominence of the intrarenal calices of the right kidney. While this may represent sequelae associated with a UPJ obstruction, correlation with renal ultrasound is recommended. 2. Bilateral nonobstructing renal calculi. 3. Area of heterogeneous low attenuation along the junction of the body and head of the pancreas. Correlation with MRI of the abdomen and pelvis is recommended, as an underlying pancreatic mass cannot be excluded. 4. Moderate-severity pleural base calcification within the left lung base, likely consistent with prior asbestos exposure. 5. Evidence of prior cholecystectomy. Aortic Atherosclerosis (ICD10-I70.0). Electronically Signed   By: Virgina Norfolk M.D.   On: 08/27/2019 22:02    Procedures Procedures (including critical care time)  Medications Ordered in ED Medications  sodium chloride flush (NS) 0.9 % injection 3 mL (has no administration in time range)  oxyCODONE-acetaminophen (PERCOCET/ROXICET) 5-325 MG per tablet 1 tablet (has no administration in time range)  fentaNYL (SUBLIMAZE) injection 50 mcg (50  mcg Intravenous Given 08/27/19 2133)    ED Course  I have reviewed the triage vital signs and the nursing notes.  Pertinent labs & imaging results that were available during my care of the patient were reviewed by me and considered in my  medical decision making (see chart for details).    MDM Rules/Calculators/A&P                      86 year old female with past medical history of CAD, A. fib on ASA only, HTN, hypothyroidism, previous aortic root replacement in 2012, presented to the emerge department brought in by EMS from home complaining of left lower quadrant abdominal pain radiating to left flank for 2 weeks.  Differential diagnoses considered include musculoskeletal pain, left-sided nephrolithiasis or ureterolithiasis, low suspicion for cystitis or pyelonephritis, low suspicion for mesenteric ischemia though patient's pain is out of portion.  Exam and noted to have atherosclerotic disease in the aorta on previous CTA of the abdomen, doubt pelvic origin of the patients presentation, presentation inconsistent with PUD, pancreatitis, SBO, lower lobe PNA, MI, dissection, diverticulitis, or herpes zoster  Labs demonstrated no significant leukocytosis, anemia, thrombocytopenia or thrombocytosis, mild creatinine elevation at 1.29 appears improved compared to previous, chronic hypoalbuminemia 3.2 slightly improved from previous, otherwise no symptom arrangements on CMP, lipase negative, urinalysis demonstrated large leukocytes, few bacteria, negative nitrites, greater than 50 WBCs concerning for infection.  Given the setting of a left-sided nephrolithiasis on previous CT imaging with continued left-sided flank pain will obtain a CT stone study to evaluate for infected stone.  Treated patient's acute pain with 50 mcg fentanyl.  CT abdomen stone study demonstrated stable marked right-sided hydronephrosis which could be sequela associated UPJ obstruction, bilateral nonobstructing renal calculi, area of heterogeneous low-attenuation along the junction of the body and head of the pancreas concerning for pancreatic mass likely warranting further evaluation with abdominal and pelvis MRI.  Upon reassessment patient continues to have left flank  pain  Given the above findings, my suspicion is that patient may have a pyelonephritis given her left lower quadrant, left flank pain has been ongoing with associated nephrolithiasis.  Will treat as pyelonephritis with Keflex 500 mg p.o. twice daily for 14 days.  We will provide the patient with a short prescription of Percocet for her acute pain as well.  The patient is safe and stable for discharge at this time with return precautions provided and a plan for follow up care in place as needed.  The plan for this patient was discussed with Dr. Rex Kras, who voiced agreement and who oversaw evaluation and treatment of this patient.  Final Clinical Impression(s) / ED Diagnoses Final diagnoses:  Left flank pain  Abdominal pain, unspecified abdominal location  Urinary tract infection without hematuria, site unspecified    Rx / DC Orders ED Discharge Orders         Ordered    oxyCODONE-acetaminophen (PERCOCET) 5-325 MG tablet  Every 6 hours PRN     08/27/19 2337    cephALEXin (KEFLEX) 500 MG capsule  2 times daily     08/27/19 2337           Filbert Berthold, MD 08/27/19 2347    Rex Kras, Wenda Overland, MD 08/31/19 1901

## 2019-08-28 ENCOUNTER — Other Ambulatory Visit: Payer: Self-pay

## 2019-08-28 ENCOUNTER — Ambulatory Visit (HOSPITAL_COMMUNITY)
Admission: RE | Admit: 2019-08-28 | Discharge: 2019-08-28 | Disposition: A | Discharge status: Home / Self Care | Payer: Medicare Other | Source: Ambulatory Visit | Attending: Family Medicine | Admitting: Family Medicine

## 2019-08-28 DIAGNOSIS — R1900 Intra-abdominal and pelvic swelling, mass and lump, unspecified site: Secondary | ICD-10-CM

## 2019-08-28 DIAGNOSIS — R102 Pelvic and perineal pain: Secondary | ICD-10-CM

## 2019-08-28 DIAGNOSIS — K862 Cyst of pancreas: Secondary | ICD-10-CM | POA: Diagnosis not present

## 2019-08-28 DIAGNOSIS — N1330 Unspecified hydronephrosis: Secondary | ICD-10-CM | POA: Diagnosis not present

## 2019-08-28 MED ORDER — GADOBUTROL 1 MMOL/ML IV SOLN
6.0000 mL | Freq: Once | INTRAVENOUS | Status: AC | PRN
  Administered 2019-08-28: 6 mL via INTRAVENOUS

## 2019-08-31 ENCOUNTER — Other Ambulatory Visit: Payer: Self-pay | Admitting: Family Medicine

## 2019-08-31 NOTE — Telephone Encounter (Signed)
Patient calling requesting refill of Crestor. This is a medication we have not prescribed before. Please advise. AS, CMA

## 2019-09-01 ENCOUNTER — Telehealth: Payer: Self-pay

## 2019-09-01 MED ORDER — SIMVASTATIN 20 MG PO TABS: 20.0000 mg | ORAL_TABLET | Freq: Every day | ORAL | 0 refills | Status: DC

## 2019-09-01 NOTE — Progress Notes (Signed)
09/01/2019  Dr. Raliegh Scarlet requests information on why pt's statin therapy was changed during her 06/25/2019-06/26/2019 hospitalization.  Records located and printed for her review.  This Probation officer is the only clinical personnel in the office today.  Charyl Bigger, CMA

## 2019-09-01 NOTE — Telephone Encounter (Signed)
Per verbal order from Dr. Raliegh Scarlet, no refill on Crestor.  Medication needs to be changed back to simvastatin 42m daily.  New RX sent to pharmacy per Dr. OHershal Coriaverbal orders.  TCharyl Bigger CMA

## 2019-09-11 ENCOUNTER — Encounter
Payer: Medicare Other | Attending: Physical Medicine and Rehabilitation | Admitting: Physical Medicine and Rehabilitation

## 2019-09-11 ENCOUNTER — Encounter: Payer: Self-pay | Admitting: Nurse Practitioner

## 2019-09-11 ENCOUNTER — Encounter: Payer: Self-pay | Admitting: Physical Medicine and Rehabilitation

## 2019-09-11 ENCOUNTER — Other Ambulatory Visit: Payer: Self-pay

## 2019-09-11 VITALS — BP 174/82 | HR 84 | Temp 97.5°F | Ht 64.0 in | Wt 110.0 lb

## 2019-09-11 DIAGNOSIS — Z5181 Encounter for therapeutic drug level monitoring: Secondary | ICD-10-CM

## 2019-09-11 DIAGNOSIS — R1012 Left upper quadrant pain: Secondary | ICD-10-CM | POA: Insufficient documentation

## 2019-09-11 DIAGNOSIS — Z79891 Long term (current) use of opiate analgesic: Secondary | ICD-10-CM | POA: Diagnosis not present

## 2019-09-11 DIAGNOSIS — G894 Chronic pain syndrome: Secondary | ICD-10-CM | POA: Insufficient documentation

## 2019-09-11 MED ORDER — OXYCODONE HCL 5 MG PO TABS: 5.0000 mg | ORAL_TABLET | Freq: Four times a day (QID) | ORAL | 0 refills | Status: DC | PRN

## 2019-09-11 NOTE — Addendum Note (Signed)
Addended by: Geryl Rankins D on: 09/11/2019 10:07 AM   Modules accepted: Orders

## 2019-09-11 NOTE — Patient Instructions (Signed)
Patient is an 86 yr old female with LUQ pain with unknown cause x 1+ months with radiating pain to thoracic spine. She is O2 dependent due to COPD, but very active,   1. Will try Oxycodone- 49m as needed for pain - 5 mg q6 hours prn for severe LUQ pain. Will prescribe 5 day Rx for now, and refill in future as we need to.  Explained can cause constipation.  2. Opiate contract and drug screen.   3. GI consult- put in urgently due to abd/LUQ pain.   4. Refer to Dr RRanell Patrickfor continued care.   5.  F/U in 3 -4 weeks for f/u with Dr RRanell Patrick

## 2019-09-11 NOTE — Progress Notes (Signed)
Subjective:    Patient ID: [name], female    DOB: 05/05/1930, 86 y.o.   MRN: [id number]  HPI  Pt is an 86 yr old female  COPD O2 dependence, Usually on 2L  24/7, has aortic valve replacement, and Restless leg syndrome here for evaluation of LUQ pain.    March 11- was sent  To hospital on March 11- hurt so bad wanted to die.   Taking tylenol for it- but doesn't help much.  Went March 30th and April 11th with abd/LUQ pain.   Had 1 Rx for oxycodone-  #15 in March- eased it some- but didn't fix it.    Mowed the yard on riding lawnmower and put it away in shed, by pulling down shed door.  Didn't hurt when did it, but only thing can think was 'out of ordinary" but mows lawn every week.    Pulled the door down on shed- next AM, went to hospital in ambulance.   Describing as constant pain- right now, it's 10/10- - feel like when lung expands ,it's worse. If lying down, or sitting down or gets up from sitting position, it's really bad then.   Just sitting there, usually, would be 5/10.    Will radiate to back.   Had an episode of LUQ pain a few months ago as well. That was when crocheting and leaned over to pick up hook- leaned over wooden armed chair, and hurt herself upper abdominal wall- but wasn't like th epain she has now.    Sometimes it hurts on LUQ, sometimes in middle/epigastric, and sometimes will hurt RUQ. Stools are normal- no constipation, no diarrhea- hasn't had to take metamucil in the last week, like nml.   Little over 1 week ago, when went to bathroom, would have some stool- would wipe 15-20 times, rectum wasn't closing, but that passed   No nausea, no reflux, no GI distress.  But comes in the middle of night, sometimes.     Pain Inventory Average Pain 8 Pain Right Now 4 My pain is constant and sore  In the last 24 hours, has pain interfered with the following? General activity 10 Relation with others 0 Enjoyment of life 10 What TIME of day is your  pain at its worst? all Sleep (in general) Poor  Pain is worse with: walking, bending, standing and some activites Pain improves with: rest and medication Relief from Meds: 8  Mobility walk without assistance ability to climb steps?  yes do you drive?  yes  Function retired  Neuro/Psych tremor anxiety  Prior Studies new  Physicians involved in your care new   Family History  Problem Relation Age of Onset  . Cancer Mother        bladder  . Lung cancer Father   . Heart disease Maternal Grandmother   . Emphysema Maternal Aunt   . Asthma Maternal Aunt   . Asthma Maternal Uncle   . Breast cancer Sister   . Uterine cancer Sister    Social History   Socioeconomic History  . Marital status: Widowed    Spouse name: Not on file  . Number of children: N  . Years of education: Not on file  . Highest education level: Not on file  Occupational History  . Occupation: housewife  Tobacco Use  . Smoking status: Former Smoker    Packs/day: 0.90    Years: 62.00    Pack years: 55.80    Types: Cigarettes  Quit date: 07/14/2010    Years since quitting: 9.1  . Smokeless tobacco: Never Used  Substance and Sexual Activity  . Alcohol use: No    Alcohol/week: 0.0 standard drinks  . Drug use: No  . Sexual activity: Never  Other Topics Concern  . Not on file  Social History Narrative  . Not on file   Social Determinants of Health   Financial Resource Strain:   . Difficulty of Paying Living Expenses:   Food Insecurity:   . Worried About Charity fundraiser in the Last Year:   . Arboriculturist in the Last Year:   Transportation Needs:   . Film/video editor (Medical):   Marland Kitchen Lack of Transportation (Non-Medical):   Physical Activity:   . Days of Exercise per Week:   . Minutes of Exercise per Session:   Stress:   . Feeling of Stress :   Social Connections:   . Frequency of Communication with Friends and Family:   . Frequency of Social Gatherings with Friends and  Family:   . Attends Religious Services:   . Active Member of Clubs or Organizations:   . Attends Archivist Meetings:   Marland Kitchen Marital Status:    Past Surgical History:  Procedure Laterality Date  . AORTIC ROOT REPLACEMENT  03/18/2011   80m Medtronic Freestyle Porcine aortic root with reimplantation of coronary arteries  . BREAST LUMPECTOMY     Left  . CATARACT EXTRACTION    . CHOLECYSTECTOMY    . FOOT SURGERY     Morton's neuroma  . KNEE ARTHROSCOPY     Left   Past Medical History:  Diagnosis Date  . Allergic rhinitis   . Aortic stenosis   . Carotid artery disease ([place]   . Colon polyps   . h/o Atrial fibrillation ([place] 03/22/2011   Brief post op   . Hematuria 11/05/2015  . Hypercalcemia   . Hypertension   . Hypothyroid   . Osteopenia   . 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 Pressure urticaria   . RLS (restless legs syndrome)   . S/P aortic valve replacement and aortoplasty 04/13/2011   289mMedtronic Freestyle porcine aortic root   . S/P aortic valve replacement with stentless valve 03/18/2011   BP (!) 174/82   Pulse 84   Temp (!) 97.5 F (36.4 C)   Ht 5' 4" (1.626 m)   Wt 110 lb (49.9 kg)   SpO2 96%   BMI 18.88 kg/m   Opioid Risk Score:   Fall Risk Score:  `1  Depression screen PHQ 2/9  Depression screen [facility]/9 09/11/2019 07/04/2019 02/28/2019 11/30/2018 11/08/2018 04/28/2018 01/05/2018  Decreased Interest 3 1 0 0 0 0 2  Down, Depressed, Hopeless 0 0 0 0 0 0 2  PHQ - 2 Score 3 1 0 0 0 0 4  Altered sleeping _0 0 _1 Tired, decreased energy _2 0 0 1 2  Change in appetite 3 0 2 0 0 1 2  Feeling bad or failure about yourself  0 0 0 0 0 1 0  Trouble concentrating 0 0 0 0 0 0 0  Moving slowly or fidgety/restless 0 0 0 0 0 0 0  Suicidal thoughts 0 0 0 0 0 0 0  PHQ-9 Score _3 0 _4 Difficult doing work/chores - Somewhat difficult Not difficult at all - - Somewhat difficult -  Some recent data might be  hidden    Review of Systems    Constitutional: Positive for appetite change and unexpected weight change.  HENT: Negative.   Eyes: Negative.   Respiratory: Positive for shortness of breath.   Cardiovascular: Negative.   Gastrointestinal: Positive for abdominal pain and constipation.  Endocrine: Negative.   Musculoskeletal: Negative.   Skin: Negative.   Allergic/Immunologic: Negative.   Neurological: Positive for tremors.  Hematological: Negative.   Psychiatric/Behavioral: The patient is nervous/anxious.   All other systems reviewed and are negative.      Objective:   Physical Exam  Awake, alert, appropriate, holding LUQ as she sits there, "helps to hold it". On O2 by [place], NAD CTA b/L- decreased movement because taking shallow breaths Very TTP- can barely tolerate touch on LUQ- but no rebound No TTP over RUQ, epigastric or Lower quadrants  Normoactive BS throughout.  Some fullness in RLQ, but nothing felt specific in any other quadrant.  Radiates pain to L thoracic paraspinals- over ribs, TTP, but no where else  4. Asymmetric increased signal and enhancement involving the posterior aspect of the left operator internus muscle. This may represent muscle strain or myositis.     Assessment & Plan:   Patient is an 86 yr old female with LUQ pain with unknown cause x 1+ months with radiating pain to thoracic spine. She is O2 dependent due to COPD, but very active,   1. Will try Oxycodone- 48m as needed for pain - 5 mg q6 hours prn for severe LUQ pain. Will prescribe 5 day Rx for now, and refill in future as we need to.  Explained can cause constipation.  2. Opiate contract and drug screen.   3. GI consult- put in urgently due to abd/LUQ pain.   4. Refer to Dr RRanell Patrickfor continued care.   5.  F/U in 3 -4 weeks for f/u with Dr RRanell Patrick  I spent a total of 50 minutes on appointment- more than 30 minutes discussing causes/pain control.

## 2019-09-15 ENCOUNTER — Other Ambulatory Visit: Payer: Self-pay | Admitting: Emergency Medicine

## 2019-09-15 LAB — DRUG TOX MONITOR 1 W/CONF, ORAL FLD
Amphetamines: NEGATIVE ng/mL (ref <10)
Barbiturates: NEGATIVE ng/mL (ref <10)
Benzodiazepines: NEGATIVE ng/mL (ref <0.50)
Buprenorphine: NEGATIVE ng/mL (ref <0.10)
Cocaine: NEGATIVE ng/mL (ref <5.0)
Codeine: NEGATIVE ng/mL (ref <2.5)
Dihydrocodeine: NEGATIVE ng/mL (ref <2.5)
Fentanyl: NEGATIVE ng/mL (ref <0.10)
Heroin Metabolite: NEGATIVE ng/mL (ref <1.0)
Hydrocodone: NEGATIVE ng/mL (ref <2.5)
Hydromorphone: NEGATIVE ng/mL (ref <2.5)
MARIJUANA: NEGATIVE ng/mL (ref <2.5)
MDMA: NEGATIVE ng/mL (ref <10)
Meprobamate: NEGATIVE ng/mL (ref <2.5)
Methadone: NEGATIVE ng/mL (ref <5.0)
Morphine: NEGATIVE ng/mL (ref <2.5)
Nicotine Metabolite: NEGATIVE ng/mL (ref <5.0)
Norhydrocodone: NEGATIVE ng/mL (ref <2.5)
Noroxycodone: 56.5 ng/mL — ABNORMAL HIGH (ref <2.5)
Opiates: POSITIVE ng/mL — AB (ref <2.5)
Oxycodone: 137.6 ng/mL — ABNORMAL HIGH (ref <2.5)
Oxymorphone: NEGATIVE ng/mL (ref <2.5)
Phencyclidine: NEGATIVE ng/mL (ref <10)
Tapentadol: NEGATIVE ng/mL (ref <5.0)
Tramadol: NEGATIVE ng/mL (ref <5.0)
Zolpidem: NEGATIVE ng/mL (ref <5.0)

## 2019-09-15 LAB — DRUG TOX ALC METAB W/CON, ORAL FLD: Alcohol Metabolite: NEGATIVE ng/mL (ref <25)

## 2019-09-16 DIAGNOSIS — J449 Chronic obstructive pulmonary disease, unspecified: Secondary | ICD-10-CM | POA: Diagnosis not present

## 2019-09-16 DIAGNOSIS — J438 Other emphysema: Secondary | ICD-10-CM | POA: Diagnosis not present

## 2019-09-18 ENCOUNTER — Telehealth: Payer: Self-pay | Admitting: *Deleted

## 2019-09-18 NOTE — Telephone Encounter (Signed)
Oral swab drug screen was consistent for prescribed medications.

## 2019-09-22 ENCOUNTER — Other Ambulatory Visit: Payer: Self-pay

## 2019-09-22 ENCOUNTER — Telehealth: Payer: Self-pay | Admitting: Physician Assistant

## 2019-09-22 ENCOUNTER — Other Ambulatory Visit (INDEPENDENT_AMBULATORY_CARE_PROVIDER_SITE_OTHER): Payer: Medicare Other

## 2019-09-22 DIAGNOSIS — R350 Frequency of micturition: Secondary | ICD-10-CM | POA: Diagnosis not present

## 2019-09-22 DIAGNOSIS — R309 Painful micturition, unspecified: Secondary | ICD-10-CM | POA: Diagnosis not present

## 2019-09-22 DIAGNOSIS — N3001 Acute cystitis with hematuria: Secondary | ICD-10-CM

## 2019-09-22 LAB — POCT URINALYSIS DIPSTICK
Bilirubin, UA: NEGATIVE
Glucose, UA: NEGATIVE
Ketones, UA: NEGATIVE
Nitrite, UA: POSITIVE
Protein, UA: POSITIVE — AB
Spec Grav, UA: 1.02 (ref 1.010–1.025)
Urobilinogen, UA: 0.2 E.U./dL
pH, UA: 6.5 (ref 5.0–8.0)

## 2019-09-22 MED ORDER — NITROFURANTOIN MONOHYD MACRO 100 MG PO CAPS: 100.0000 mg | ORAL_CAPSULE | Freq: Two times a day (BID) | ORAL | 0 refills | Status: AC

## 2019-09-22 NOTE — Telephone Encounter (Signed)
Abnormal UA with hematuria, proteinuria,+ leukocytes, +nitrites. Empirically treat with nitrofurantoin. Send for culture and pending results change treatment plan if needed.

## 2019-09-24 LAB — URINE CULTURE

## 2019-09-25 ENCOUNTER — Ambulatory Visit: Payer: Medicare Other | Admitting: Nurse Practitioner

## 2019-09-25 ENCOUNTER — Encounter: Payer: Self-pay | Admitting: Nurse Practitioner

## 2019-09-25 VITALS — BP 130/72 | HR 86 | Temp 97.8°F | Ht 64.0 in | Wt 107.0 lb

## 2019-09-25 DIAGNOSIS — R1012 Left upper quadrant pain: Secondary | ICD-10-CM

## 2019-09-25 MED ORDER — OMEPRAZOLE 40 MG PO CPDR: 40.0000 mg | DELAYED_RELEASE_CAPSULE | Freq: Every day | ORAL | 3 refills | Status: DC

## 2019-09-25 NOTE — Patient Instructions (Addendum)
If you are age 84 or older, your body mass index should be between 23-30. Your Body mass index is 18.37 kg/m. If this is out of the aforementioned range listed, please consider follow up with your Primary Care Provider.  If you are age 47 or younger, your body mass index should be between 19-25. Your Body mass index is 18.37 kg/m. If this is out of the aformentioned range listed, please consider follow up with your Primary Care Provider.   START Omeprazole 40 mg 1 capsule 30 minutes before breakfast.,  Follow up as needed.

## 2019-09-25 NOTE — Progress Notes (Signed)
ASSESSMENT / PLAN:   86 year old female with COPD O2 dependent, CKD, AVR, restless leg syndrome, aortic stenosis, hypertension, hypothyroidism, osteopenia, carotid artery disease, cholecystectomy  # Several week history of LUQ pain radiating around her left back --PCP evaluation and ED evaluation unrevealing.  CT angiography of the chest abdomen and pelvis late March showed no hepatobiliary abnormalities. Pancreas and or spleen remarkable.  No stomach/bowel abnormalities noted. However a repeat non-contrast CT scan on 08/27/19 suggested an area of heterogeneous low attenuation along the junction of the body and head of the pancreas --Her pain doesn't seem to be gastrointestinal related but she is at risk for PUD taking prednisone and asa. Will give trial of Omeprazole.  --Daughter will let us know if there is any improvement.  --I will ask Dr. Ardis Hughs to reivew CT scans of pancreas and make sure he has no concerns  # Probably RLL cancer --Apparently not candidate for biopsy --She wouldn't undergo treatment anyway.   HPI:     Chief Complaint: Abdominal pain   [name] is an 86 year old female with PMH as listed above.  She is new to the practice, referred by Courtney Heys, MD for evaluation of LUQ pain.   08/16/19 ED - for chest pain and left flank pain. .  CT angio of the chest abdomen and pelvis remarkable for a spiculated lung mass in the RLL measuring 2.4 x 1.8 cm just above malignancy .  No abnormalities of the liver or pancreas.  Spleen normal .  Right hydronephrosis without definite stone.  In the ED her symptoms improved with muscle relaxers and pain medication.  Released from ED and advised follow-up with PCP.  08/21/19 PCP visit.  Percocet refilled, Lidoderm patch prescribed.  Advised to follow-up with pulmonologist regarding 1 female  08/27/19 ED visit  -LLQ pain radiating to flank area.  Cr 1.29, Albumin 3.2, liver test otherwise unremarkable.  Lipase normal at 24.   CBC normal.  UA felt to be compatible with UTI, prescribed Keflex for possible pyelonephritis.   09/11/19 office visit with Dr. Dagoberto Ligas with [facility] for evaluation of LUQ pain.  Started on oxycodone as needed for severe pain.  Referred to GI.    Interim History:  Patient is here with her daughter.  She gives a history of LUQ pain radiating around to her left shoulder blade since March.  She has a lot of gurgling noises in the LUQ .  No associated nausea/vomiting.  Her bowel movements are normal . She says the pain is not related to eating.  Bending over increases the pain as does transitioning from sitting to standing position.  She says Flexeril did not help. Oxycodone eases but does not resolve the pain.  Pain is not associated with any nausea nor vomiting.  No history of PUD.  She did take Motrin for a month but daughter stopped that 1.5 months ago out of concern for kidney disease.  She takes prednisone plus aspirin.  Patient denies a history of GERD, doesn't take a PPI.  Reports 8 pound weight loss since March.  Found to have spiculated lung mass on CT scan at time of ED visit.  Daughter says pulmonologist felt she was not a candidate for biopsy, especially since she is unwilling to undergo any treatment for lung cancer anyway.   Data Reviewed:  08/28/19 - Abd MRI IMPRESSION: 1. No acute findings within the abdomen or  pelvis. 2. Chronic right hydronephrosis which is felt to likely reflect chronic right UPJ obstruction. 3. Cystic lesion within head of pancreas is stable in size compared with 02/22/2011. Based on age, size of lesion, and relative stability no further follow-up is indicated of this lesion. This recommendation follows ACR consensus guidelines: Management of Incidental Pancreatic Cysts: A White Paper of the ACR Incidental Findings Committee. J Am Coll Radiol 1610;96:045-409. 4. Asymmetric increased signal and enhancement involving the posterior  aspect of the left operator internus muscle. This may represent muscle strain or myositis.   Past Medical History:  Diagnosis Date  . Allergic rhinitis   . Aortic stenosis   . Carotid artery disease ([place])   . Colon polyps   . h/o Atrial fibrillation ([place]) 03/22/2011   Brief post op   . Hematuria 11/05/2015  . Hypercalcemia   . Hypertension   . Hypothyroid   . Osteopenia   . 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 Pressure urticaria   . RLS (restless legs syndrome)   . S/P aortic valve replacement and aortoplasty 04/13/2011   49m Medtronic Freestyle porcine aortic root   . S/P aortic valve replacement with stentless valve 03/18/2011     Past Surgical History:  Procedure Laterality Date  . AORTIC ROOT REPLACEMENT  03/18/2011   211mMedtronic Freestyle Porcine aortic root with reimplantation of coronary arteries  . BREAST LUMPECTOMY     Left  . CATARACT EXTRACTION    . CHOLECYSTECTOMY    . FOOT SURGERY     Morton's neuroma  . KNEE ARTHROSCOPY     Left   Family History  Problem Relation Age of Onset  . Cancer Mother        bladder  . Lung cancer Father   . Heart disease Maternal Grandmother   . Emphysema Maternal Aunt   . Asthma Maternal Aunt   . Asthma Maternal Uncle   . Breast cancer Sister   . Uterine cancer Sister    Social History   Tobacco Use  . Smoking status: Former Smoker    Packs/day: 0.90    Years: 62.00    Pack years: 55.80    Types: Cigarettes    Quit date: 07/14/2010    Years since quitting: 9.2  . Smokeless tobacco: Never Used  Substance Use Topics  . Alcohol use: No    Alcohol/week: 0.0 standard drinks  . Drug use: No   Current Outpatient Medications  Medication Sig Dispense Refill  . albuterol (PROAIR HFA) 108 (90 Base) MCG/ACT inhaler Inhale 2 puffs into the lungs every 6 (six) hours as needed for wheezing or shortness of breath. 1 Inhaler 3  . amLODipine (NORVASC) 2.5 MG tablet Take 1 tablet (2.5 mg total) by mouth daily. 30 tablet 1  .  aspirin EC 81 MG tablet Take 81 mg by mouth daily.    . Marland KitchenuPROPion (WELLBUTRIN) 75 MG tablet Take 1 tablet (75 mg total) by mouth 2 (two) times daily. 1 tablet 0  . isosorbide mononitrate (IMDUR) 30 MG 24 hr tablet Take 0.5 tablets (15 mg total) by mouth daily. 30 tablet 1  . levothyroxine (SYNTHROID) 112 MCG tablet Take 1 tablet (112 mcg total) by mouth daily. 90 tablet 0  . lidocaine (LIDODERM) 5 % Place 1 patch onto the skin every 12 (twelve) hours. Remove & Discard patch within 12 hours or as directed 30 patch 0  . MELATONIN PO Take 1 tablet by mouth at bedtime.     . methocarbamol (ROBAXIN) 500  MG tablet Take 1 tablet (500 mg total) by mouth 2 (two) times daily. 20 tablet 0  . nitrofurantoin, macrocrystal-monohydrate, (MACROBID) 100 MG capsule Take 1 capsule (100 mg total) by mouth 2 (two) times daily for 5 days. 10 capsule 0  . Omega-3 Fatty Acids (FISH OIL) 1000 MG CAPS Take 1,000 mg by mouth daily.    Marland Kitchen oxyCODONE (ROXICODONE) 5 MG immediate release tablet Take 1 tablet (5 mg total) by mouth every 6 (six) hours as needed for severe pain. 20 tablet 0  . OXYGEN Inhale 2 L into the lungs continuous.    . predniSONE (DELTASONE) 5 MG tablet Take 1 tablet by mouth once daily with breakfast (Patient taking differently: Take 5 mg by mouth daily with breakfast. ) 90 tablet 0  . rosuvastatin (CRESTOR) 20 MG tablet Take 1 tablet (20 mg total) by mouth daily at 6 PM. 30 tablet 1  . STIOLTO RESPIMAT 2.5-2.5 MCG/ACT AERS INHALE 2 PUFFS BY MOUTH ONCE DAILY. PLEASE SEE DR FOR REFILLS. 4 g 12   No current facility-administered medications for this visit.   Allergies  Allergen Reactions  . Clarithromycin Other (See Comments)    Mouth/throat sore  . Tamsulosin Itching  . Trazodone And Nefazodone Other (See Comments)    Dizziness   . Penicillins Itching and Rash    Did it involve swelling of the face/tongue/throat, SOB, or low BP? No Did it involve sudden or severe rash/hives, skin peeling, or any  reaction on the inside of your mouth or nose? Yes Did you need to seek medical attention at a hospital or doctor's office? Yes When did it last happen?Young woman  If all above answers are "NO", may proceed with cephalosporin use.      Review of Systems: Positive for back pain, headaches, shortness of breath, frequent urination.  All other systems reviewed and negative except where noted in HPI.   Creatinine clearance cannot be calculated (Patient's most recent lab result is older than the maximum 21 days allowed.)   Physical Exam:    Wt Readings from Last 3 Encounters:  09/25/19 107 lb (48.5 kg)  09/11/19 110 lb (49.9 kg)  08/21/19 113 lb 3.2 oz (51.3 kg)    BP 130/72   Pulse 86   Temp 97.8 F (36.6 C)   Ht 5' 4" (1.626 m)   Wt 107 lb (48.5 kg)   SpO2 96%   BMI 18.37 kg/m  Constitutional:  Pleasant female in no acute distress. Psychiatric: Normal mood and affect. Behavior is normal. EENT: Pupils normal.  Conjunctivae are normal. No scleral icterus. Neck supple.  Cardiovascular: Normal rate, regular rhythm. No edema Pulmonary/chest: Effort normal and breath sounds normal. No wheezing, rales or rhonchi. Abdominal: Soft, nondistended, nontender. Bowel sounds active throughout. There are no masses palpable. No hepatomegaly. Neurological: Alert and oriented to person place and time. Skin: Skin is warm and dry. No rashes noted.  Renee Savoy, NP  09/25/2019, 11:45 AM  Cc:  Referring Provider Courtney Heys, MD

## 2019-09-26 ENCOUNTER — Ambulatory Visit: Payer: Medicare Other | Admitting: Physical Medicine and Rehabilitation

## 2019-09-27 ENCOUNTER — Encounter: Payer: Self-pay | Admitting: Nurse Practitioner

## 2019-09-27 NOTE — Progress Notes (Signed)
I agree with the above note, plan.  Her MRI was good quality, shows that the pancreatic cystic lesion is really not changed in 9 years since it was last seen.  Very unlikely that this is ever going to cause her problems now or has ever caused her problems in the past.  No need for further dedicated pancreatic testing.

## 2019-10-04 ENCOUNTER — Encounter
Payer: Medicare Other | Attending: Physical Medicine and Rehabilitation | Admitting: Physical Medicine and Rehabilitation

## 2019-10-04 ENCOUNTER — Encounter: Payer: Self-pay | Admitting: Physical Medicine and Rehabilitation

## 2019-10-04 ENCOUNTER — Other Ambulatory Visit: Payer: Self-pay

## 2019-10-04 VITALS — BP 132/75 | HR 76 | Temp 98.5°F | Ht 64.0 in | Wt 105.0 lb

## 2019-10-04 DIAGNOSIS — Z5181 Encounter for therapeutic drug level monitoring: Secondary | ICD-10-CM | POA: Insufficient documentation

## 2019-10-04 DIAGNOSIS — R1012 Left upper quadrant pain: Secondary | ICD-10-CM

## 2019-10-04 DIAGNOSIS — G894 Chronic pain syndrome: Secondary | ICD-10-CM | POA: Insufficient documentation

## 2019-10-04 DIAGNOSIS — Z79891 Long term (current) use of opiate analgesic: Secondary | ICD-10-CM | POA: Diagnosis not present

## 2019-10-04 DIAGNOSIS — G4701 Insomnia due to medical condition: Secondary | ICD-10-CM

## 2019-10-04 MED ORDER — AMITRIPTYLINE HCL 10 MG PO TABS: 10.0000 mg | ORAL_TABLET | Freq: Every day | ORAL | 0 refills | Status: DC

## 2019-10-04 NOTE — Progress Notes (Signed)
Subjective:    Patient ID: [name], female    DOB: [birth year], 86 y.o.   MRN: [id number]  HPI  Pt is an 86 yr old female with COPD O2 dependence, Usually on 2L 24/7, has aortic valve replacement, and Restless leg syndrome here for evaluation of LUQ pain.    March 11- was sent  To hospital on March 11- hurt so bad wanted to die.    Taking tylenol for it- but doesn't help much.  Went March 30th and April 11th with abd/LUQ pain.    Had 1 Rx for oxycodone-  #15 in March- eased it some- but didn't fix it.    Mowed the yard on riding lawnmower and put it away in shed, by pulling down shed door.  Didn't hurt when did it, but only thing can think was 'out of ordinary" but mows lawn every week.    Pulled the door down on shed- next AM, went to hospital in ambulance.   Describing as constant pain- right now, it's 10/10- - feel like when lung expands ,it's worse. If lying down, or sitting down or gets up from sitting position, it's really bad then.    Just sitting there, usually, would be 5/10.     Will radiate to back.    Had an episode of LUQ pain a few months ago as well. That was when crocheting and leaned over to pick up hook- leaned over wooden armed chair, and hurt herself upper abdominal wall- but wasn't like th epain she has now.    Sometimes it hurts on LUQ, sometimes in middle/epigastric, and sometimes will hurt RUQ. Stools are normal- no constipation, no diarrhea- hasn't had to take metamucil in the last week, like nml.    Previosuly, when went to bathroom, would have some stool- would wipe 15-20 times, rectum wasn't closing, but that passed   No nausea, no reflux, no GI distress.  But comes in the middle of night, sometimes.   Has since followed up with GI and workup has been negative for source. Was started on Omeprazole which she has been taking daily without benefit. She also did not feel benefit from the Oxycodone, and stopped taking this.   Pain Inventory Average  Pain 6 Pain Right Now 5 My pain is stabbing  In the last 24 hours, has pain interfered with the following? General activity 8 Relation with others 0 Enjoyment of life 5 What TIME of day is your pain at its worst? morning Sleep (in general) Fair  Pain is worse with: bending, standing and breathing Pain improves with: rest and medication Relief from Meds: 5  Mobility walk without assistance ability to climb steps?  yes do you drive?  yes  Function not employed: date last employed 1970  Neuro/Psych tremor tingling trouble walking  Prior Studies Any changes since last visit?  no  Physicians involved in your care Any changes since last visit?  no   Family History  Problem Relation Age of Onset  . Cancer Mother        bladder  . Lung cancer Father   . Heart disease Maternal Grandmother   . Emphysema Maternal Aunt   . Asthma Maternal Aunt   . Asthma Maternal Uncle   . Breast cancer Sister   . Uterine cancer Sister    Social History   Socioeconomic History  . Marital status: Widowed    Spouse name: Not on file  . Number of children: N  .  Years of education: Not on file  . Highest education level: Not on file  Occupational History  . Occupation: housewife  Tobacco Use  . Smoking status: Former Smoker    Packs/day: 0.90    Years: 62.00    Pack years: 55.80    Types: Cigarettes    Quit date: 07/14/2010    Years since quitting: 9.2  . Smokeless tobacco: Never Used  Substance and Sexual Activity  . Alcohol use: No    Alcohol/week: 0.0 standard drinks  . Drug use: No  . Sexual activity: Never  Other Topics Concern  . Not on file  Social History Narrative  . Not on file   Social Determinants of Health   Financial Resource Strain:   . Difficulty of Paying Living Expenses:   Food Insecurity:   . Worried About Charity fundraiser in the Last Year:   . Arboriculturist in the Last Year:   Transportation Needs:   . Film/video editor (Medical):   Marland Kitchen  Lack of Transportation (Non-Medical):   Physical Activity:   . Days of Exercise per Week:   . Minutes of Exercise per Session:   Stress:   . Feeling of Stress :   Social Connections:   . Frequency of Communication with Friends and Family:   . Frequency of Social Gatherings with Friends and Family:   . Attends Religious Services:   . Active Member of Clubs or Organizations:   . Attends Archivist Meetings:   Marland Kitchen Marital Status:    Past Surgical History:  Procedure Laterality Date  . AORTIC ROOT REPLACEMENT  03/18/2011   62m Medtronic Freestyle Porcine aortic root with reimplantation of coronary arteries  . BREAST LUMPECTOMY     Left  . CATARACT EXTRACTION    . CHOLECYSTECTOMY    . FOOT SURGERY     Morton's neuroma  . KNEE ARTHROSCOPY     Left   Past Medical History:  Diagnosis Date  . Allergic rhinitis   . Aortic stenosis   . [place]   . Colon polyps   . h/o Atrial fibrillation ([place] 03/22/2011   Brief post op   . Hematuria 11/05/2015  . Hypercalcemia   . Hypertension   . Hypothyroid   . Osteopenia   . 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 Pressure urticaria   . RLS (restless legs syndrome)   . S/P aortic valve replacement and aortoplasty 04/13/2011   253mMedtronic Freestyle porcine aortic root   . S/P aortic valve replacement with stentless valve 03/18/2011   BP 132/75   Pulse 76   Temp 98.5 F (36.9 C)   Ht _0  (1.626 m)   Wt 105 lb (47.6 kg)   SpO2 96%   BMI 18.02 kg/m   Opioid Risk Score:   Fall Risk Score:  `1  Depression screen PHQ 2/9  [facility] 09/11/2019 07/04/2019 02/28/2019 11/30/2018 11/08/2018 04/28/2018 01/05/2018  Decreased Interest 3 1 0 0 0 0 2  Down, Depressed, Hopeless 0 0 0 0 0 0 2  PHQ - 2 Score 3 1 0 0 0 0 4  Altered sleeping _1 0 _2 Tired, decreased energy _3 0 0 1 2  Change in appetite 3 0 2 0 0 1 2  Feeling bad or failure about yourself  0 0 0 0 0 1 0  Trouble concentrating 0 0 0  0 0 0 0  Moving slowly or fidgety/restless 0 0 0 0 0 0 0  Suicidal thoughts 0 0 0 0 0 0 0  PHQ-9 Score _0 0 _1 Difficult doing work/chores - Somewhat difficult Not difficult at all - - Somewhat difficult -  Some recent data might be hidden     Review of Systems  Constitutional: Positive for appetite change.  Respiratory: Positive for shortness of breath.   Gastrointestinal: Positive for abdominal pain.  Neurological: Positive for tremors.       Tingling  All other systems reviewed and are negative.      Objective:   Physical Exam Gen: no distress, normal appearing HEENT: oral mucosa pink and moist, NCAT Cardio: Reg rate Chest: Decreased movement because taking shallow breaths. On 2L O2.  Abd: Unable to tolerate palpation of LUQ due to tenderness. Ext: no edema Skin: intact Neuro: AOx3 Musculoskeletal: 5/5 strength throughout. Psych: pleasant, normal affect    Assessment & Plan:  Patient is an 86 yr old female with LUQ pain with unknown cause x 1+ months with radiating pain to thoracic spine. She is O2 dependent due to COPD, with LUQ of unknown diagnosis.   --GI note reviewed and negative for source. Omeprazole provides no relief.  --Tylenol has been the most helpful medication thus far. Oxycodone does not help and she stopped it. She has not been taking NSAIDs due to elevated Cr.  --Pain is worst at night and wakes her from her sleep. Will trial Amitriptyline 88m HS to help with pain at night and to help her to sleep.   All questions answered. Follow-up in 1 month to assess progress with above interventions. Advised to call beforehand to discuss response to medication.

## 2019-10-09 ENCOUNTER — Telehealth: Payer: Self-pay | Admitting: Emergency Medicine

## 2019-10-09 NOTE — Telephone Encounter (Signed)
Called patient and advised in April Stiolto was sent with 12 refills. She needs to call her pharmacy and order refill. Patient say appt needed was on her rx field of previous inhaler. She will call pharmacy. Also made patient aware RB did want to see her back with her sons. She will call back to schedule. Nothing further needed.

## 2019-10-17 DIAGNOSIS — Z961 Presence of intraocular lens: Secondary | ICD-10-CM | POA: Diagnosis not present

## 2019-10-17 DIAGNOSIS — H04552 Acquired stenosis of left nasolacrimal duct: Secondary | ICD-10-CM | POA: Diagnosis not present

## 2019-10-17 DIAGNOSIS — H43813 Vitreous degeneration, bilateral: Secondary | ICD-10-CM | POA: Diagnosis not present

## 2019-10-18 ENCOUNTER — Other Ambulatory Visit: Payer: Self-pay

## 2019-10-18 MED ORDER — AMLODIPINE BESYLATE 2.5 MG PO TABS: 2.5000 mg | ORAL_TABLET | Freq: Every day | ORAL | 0 refills | Status: DC

## 2019-10-23 ENCOUNTER — Other Ambulatory Visit: Payer: Self-pay | Admitting: Physician Assistant

## 2019-10-23 DIAGNOSIS — E038 Other specified hypothyroidism: Secondary | ICD-10-CM

## 2019-10-23 DIAGNOSIS — J438 Other emphysema: Secondary | ICD-10-CM

## 2019-10-23 MED ORDER — LEVOTHYROXINE SODIUM 112 MCG PO TABS: 112.0000 ug | ORAL_TABLET | Freq: Every day | ORAL | 0 refills | Status: DC

## 2019-10-23 MED ORDER — PREDNISONE 5 MG PO TABS: ORAL_TABLET | ORAL | 0 refills | Status: DC

## 2019-10-30 ENCOUNTER — Encounter: Payer: Medicare Other | Admitting: Physical Medicine and Rehabilitation

## 2019-10-31 DIAGNOSIS — H04552 Acquired stenosis of left nasolacrimal duct: Secondary | ICD-10-CM | POA: Diagnosis not present

## 2019-10-31 DIAGNOSIS — H43813 Vitreous degeneration, bilateral: Secondary | ICD-10-CM | POA: Diagnosis not present

## 2019-10-31 DIAGNOSIS — Z961 Presence of intraocular lens: Secondary | ICD-10-CM | POA: Diagnosis not present

## 2019-11-01 ENCOUNTER — Ambulatory Visit: Payer: Medicare Other | Admitting: Physical Medicine and Rehabilitation

## 2019-11-27 ENCOUNTER — Other Ambulatory Visit: Payer: Self-pay

## 2019-11-27 ENCOUNTER — Ambulatory Visit (INDEPENDENT_AMBULATORY_CARE_PROVIDER_SITE_OTHER): Payer: Medicare Other

## 2019-11-27 DIAGNOSIS — W450XXA Nail entering through skin, initial encounter: Secondary | ICD-10-CM | POA: Diagnosis not present

## 2019-11-27 DIAGNOSIS — Z23 Encounter for immunization: Secondary | ICD-10-CM | POA: Diagnosis not present

## 2019-11-27 NOTE — Progress Notes (Signed)
Pt states that she scraped her RUE on a nail last week and requesting Tdap be updated.  Pt given immunization and tolerated well.  Examined wound, which is closed with no s/sxs of infection.  Charyl Bigger, CMA

## 2019-11-30 ENCOUNTER — Encounter: Payer: Self-pay | Admitting: Emergency Medicine

## 2019-11-30 ENCOUNTER — Other Ambulatory Visit: Payer: Self-pay

## 2019-11-30 ENCOUNTER — Ambulatory Visit (INDEPENDENT_AMBULATORY_CARE_PROVIDER_SITE_OTHER): Payer: Medicare Other

## 2019-11-30 ENCOUNTER — Ambulatory Visit: Payer: Medicare Other | Admitting: Emergency Medicine

## 2019-11-30 VITALS — BP 124/64 | HR 80 | Temp 98.4°F | Ht 64.0 in | Wt 104.4 lb

## 2019-11-30 DIAGNOSIS — R0600 Dyspnea, unspecified: Secondary | ICD-10-CM

## 2019-11-30 DIAGNOSIS — J432 Centrilobular emphysema: Secondary | ICD-10-CM | POA: Diagnosis not present

## 2019-11-30 DIAGNOSIS — J438 Other emphysema: Secondary | ICD-10-CM | POA: Diagnosis not present

## 2019-11-30 DIAGNOSIS — J439 Emphysema, unspecified: Secondary | ICD-10-CM | POA: Diagnosis not present

## 2019-11-30 DIAGNOSIS — R911 Solitary pulmonary nodule: Secondary | ICD-10-CM

## 2019-11-30 MED ORDER — ALBUTEROL SULFATE HFA 108 (90 BASE) MCG/ACT IN AERS: 2.0000 | INHALATION_SPRAY | Freq: Four times a day (QID) | RESPIRATORY_TRACT | 5 refills | Status: DC | PRN

## 2019-11-30 MED ORDER — PREDNISONE 10 MG PO TABS: ORAL_TABLET | ORAL | 3 refills | Status: DC

## 2019-11-30 NOTE — Assessment & Plan Note (Signed)
Progressive dyspnea.  Certainly this could relate to progression of her COPD but I feel we need to ensure no evolving effusion or significant change related to her right lower lobe nodule, pleural plaque and presumed asbestosis.  We will perform a chest x-ray today.  I will empirically increase her prednisone as below

## 2019-11-30 NOTE — Patient Instructions (Addendum)
We will perform a CT chest at the end of September 2021 Chest x-ray today Please continue Stiolto 2 puffs once daily. Keep your albuterol available to use 2 puffs when you need it for shortness of breath, chest tightness, wheezing We will increase your prednisone to 10 mg daily. Continue your oxygen at 2 L/min as you have been using it Follow with Dr Lamonte Sakai in September after the Ct chest to review

## 2019-11-30 NOTE — Assessment & Plan Note (Signed)
Continue Stiolto continue albuterol as needed.  I will increase her prednisone to 10 mg daily to see if she gets additional benefit as I suspect that COPD is driving her dyspnea

## 2019-11-30 NOTE — Assessment & Plan Note (Signed)
RLL nodule, very suspicious for malignancy.  That being said we have discussed the pros and cons of biopsy, therapy and she wants to be conservative.  I will plan to repeat her CT chest in September (at the 77-monthmark) so that we can at least have some feeling for patient progression in any impact this may be having on her overall clinical status.

## 2019-11-30 NOTE — Progress Notes (Signed)
Subjective:    Patient ID: Zada Girt, female    DOB: 1929/09/29, 86 y.o.   MRN: [id number]  HPI Ms. Yager is 3 and has a history of COPD and associated chronic hypoxemic respiratory failure.  She only intermittently desaturates on exertion and did not desaturate when we walked her last time.  She has purchased a portable oxygen concentrator and uses 2 L/min with exertion.  She is managed on Stiolto, rarely uses albuterol.  She reports that her breathing is stable - she has dyspnea when walking up hill. She still gets outside and ride lawnmower. Does her inside chores slowly - with her O2 on. Rarely uses albuterol. She is on stable dose of pred 87m daily. No flares, no exacerbations reported.  PNA vaccine up to date. Flu shot done last week. Minimal cough. She does have some chronic congestion. She has lost 13 lbs in 10 months, unintentional.   Hospital follow-up 07/24/2019 --86year old woman with a history of paroxysmal atrial fibrillation, aortic valve replacement, hypertension, followed here for severe COPD with associated hypoxemic respiratory failure, hypercalcemia.  She was admitted for severe HTN and associated "indigestion" early February. Cardiac eval was reassuring.  Part of her evaluation included a CT scan of the chest 06/25/2019 which I have reviewed, shows calcified pleural plaquing and a 2.4 x 1.5 cm spiculated right lower lobe mass lesion concerning for possible malignancy. She wasn't having any cough, CP, sputum when she was admitted.   Currently on prednisone 577m stiolto, albuterol which she uses rarely  ROV 11/30/19 --follow-up visit for [age]ear old woman with atrial fibrillation, history of AVR, hypertension.  I follow her for severe COPD with associated hypoxemic respiratory failure as well as suspected asbestos exposure with an abnormal CT chest with pleural plaquing and a 2.4 x 1.8 spiculated right lower lobe mass concerning for possible malignancy, reimaged 08/16/19.  We have not  committed to biopsy or therapy for the mass lesion to date. She reports today that her breathing has been worse - she is more SOB with any activity. Her POC is on 2L/min at all times. She remains on Stiolto, feels that she is benefiting, uses albuterol 1-2x a day. Minimal cough. No wheeze. She is having progressive tremor when she tries to write.    Review of Systems As per HPi     Objective:   Physical Exam Vitals:   11/30/19 1053  BP: 124/64  Pulse: 80  Temp: 98.4 F (36.9 C)  TempSrc: Oral  SpO2: 97%  Weight: 104 lb 6.4 oz (47.4 kg)  Height: _0  (1.626 m)   Gen: Pleasant, thin, in no distress,  normal affect  ENT: No lesions,  mouth clear,  oropharynx clear, no postnasal drip  Neck: No JVD, no stridor  Lungs: No use of accessory muscles, no crackles or wheezing on normal respiration, no wheeze on forced expiration  Cardiovascular: RRR, heart sounds normal, no murmur or gallops, no peripheral edema  Musculoskeletal: No deformities, no cyanosis or clubbing  Neuro: alert, awake, non focal  Skin: Warm, no lesions or rash     Assessment & Plan:  Dyspnea Progressive dyspnea.  Certainly this could relate to progression of her COPD but I feel we need to ensure no evolving effusion or significant change related to her right lower lobe nodule, pleural plaque and presumed asbestosis.  We will perform a chest x-ray today.  I will empirically increase her prednisone as below  Chronic obstructive pulmonary disease ([place]seContinue Stiolto continue albuterol  as needed.  I will increase her prednisone to 10 mg daily to see if she gets additional benefit as I suspect that COPD is driving her dyspnea  Pulmonary nodule 1 cm or greater in diameter RLL nodule, very suspicious for malignancy.  That being said we have discussed the pros and cons of biopsy, therapy and she wants to be conservative.  I will plan to repeat her CT chest in September (at the 65-monthmark) so that we can at least  have some feeling for patient progression in any impact this may be having on her overall clinical status.  RBaltazar Apo MD, PhD 11/30/2019, 11:35 AM [facility] [phone] if no answer [phone]

## 2019-12-04 DIAGNOSIS — N1832 Chronic kidney disease, stage 3b: Secondary | ICD-10-CM | POA: Diagnosis not present

## 2019-12-11 ENCOUNTER — Other Ambulatory Visit: Payer: Self-pay | Admitting: Family Medicine

## 2019-12-11 DIAGNOSIS — R809 Proteinuria, unspecified: Secondary | ICD-10-CM | POA: Diagnosis not present

## 2019-12-11 DIAGNOSIS — N200 Calculus of kidney: Secondary | ICD-10-CM | POA: Diagnosis not present

## 2019-12-11 DIAGNOSIS — N1832 Chronic kidney disease, stage 3b: Secondary | ICD-10-CM | POA: Diagnosis not present

## 2019-12-11 DIAGNOSIS — N1330 Unspecified hydronephrosis: Secondary | ICD-10-CM | POA: Diagnosis not present

## 2019-12-11 DIAGNOSIS — I129 Hypertensive chronic kidney disease with stage 1 through stage 4 chronic kidney disease, or unspecified chronic kidney disease: Secondary | ICD-10-CM | POA: Diagnosis not present

## 2019-12-13 ENCOUNTER — Other Ambulatory Visit: Payer: Self-pay

## 2019-12-13 MED ORDER — SIMVASTATIN 20 MG PO TABS
20.0000 mg | ORAL_TABLET | Freq: Every day | ORAL | 1 refills | Status: DC
Start: 2019-12-13 — End: 2020-07-01

## 2019-12-13 NOTE — Progress Notes (Signed)
Per telephone note from 09/01/2019, pt was to switch from rosuvastatin to simvastatin 46m.  Pt called requesting refill of simvastatin.  RX sent to pharmacy.  TCharyl Bigger CMA

## 2019-12-25 ENCOUNTER — Other Ambulatory Visit: Payer: Self-pay | Admitting: Physician Assistant

## 2019-12-25 ENCOUNTER — Ambulatory Visit: Payer: Self-pay | Admitting: Physician Assistant

## 2019-12-25 DIAGNOSIS — J438 Other emphysema: Secondary | ICD-10-CM

## 2020-01-05 ENCOUNTER — Other Ambulatory Visit: Payer: Self-pay

## 2020-01-05 ENCOUNTER — Ambulatory Visit (INDEPENDENT_AMBULATORY_CARE_PROVIDER_SITE_OTHER): Payer: Medicare Other | Admitting: Physician Assistant

## 2020-01-05 ENCOUNTER — Encounter: Payer: Self-pay | Admitting: Physician Assistant

## 2020-01-05 VITALS — BP 145/68 | HR 89 | Temp 97.6°F | Ht 64.0 in | Wt 105.2 lb

## 2020-01-05 DIAGNOSIS — G2581 Restless legs syndrome: Secondary | ICD-10-CM

## 2020-01-05 DIAGNOSIS — I10 Essential (primary) hypertension: Secondary | ICD-10-CM | POA: Diagnosis not present

## 2020-01-05 DIAGNOSIS — R911 Solitary pulmonary nodule: Secondary | ICD-10-CM

## 2020-01-05 DIAGNOSIS — D509 Iron deficiency anemia, unspecified: Secondary | ICD-10-CM | POA: Diagnosis not present

## 2020-01-05 DIAGNOSIS — E785 Hyperlipidemia, unspecified: Secondary | ICD-10-CM

## 2020-01-05 DIAGNOSIS — M8580 Other specified disorders of bone density and structure, unspecified site: Secondary | ICD-10-CM

## 2020-01-05 DIAGNOSIS — Z780 Asymptomatic menopausal state: Secondary | ICD-10-CM

## 2020-01-05 DIAGNOSIS — J449 Chronic obstructive pulmonary disease, unspecified: Secondary | ICD-10-CM

## 2020-01-05 DIAGNOSIS — E2839 Other primary ovarian failure: Secondary | ICD-10-CM

## 2020-01-05 NOTE — Progress Notes (Signed)
Established Patient Office Visit  Subjective:  Patient ID: [name], female    DOB: Dec 23, 1929  Age: 86 y.o. MRN: [id number]  CC: No chief complaint on file.   HPI [name] presents for chronic follow-up.  Patient reports doing okay.  HTN: Pt denies new onset chest pain, palpitations, dizziness or lower extremity swelling. Taking medication as directed without side effects. Checks BP at home and readings can fluctuate.   Hypothyroid: Denies increased fatigue, bowel changes or heat/cold intolerance.  Reports she has been on thyroid medicine for many years.  Restless leg syndrome: States she has trouble sleeping at night due to tingling sensation of her feet.  States in the past she was taking iron 2-3 times a week which helped with her symptoms, but Dr. Raliegh Scarlet discontinued iron supplement.  Osteopenia: Patient denies being on medication in the past for bone loss.  A recent chest x-ray revealed compression fractures of T4, T5, T6, and T7 and estimated height loss at 40% of the vertebral body.   COPD, Smoking: Patient reports she is taking Wellbutrin to help her quit smoking not for mood. She is followed by Dr. Lamonte Sakai for COPD.    Past Medical History:  Diagnosis Date   Allergic rhinitis    Aortic stenosis    Carotid artery disease (HCC)    Colon polyps    h/o Atrial fibrillation ([place]) 03/22/2011   Brief post op    Hematuria 11/05/2015   Hypercalcemia    Hypertension    Hypothyroid    Osteopenia    Oxygen dependent 11/05/2015   Pressure urticaria    RLS (restless legs syndrome)    S/P aortic valve replacement and aortoplasty 04/13/2011   39m Medtronic Freestyle porcine aortic root    S/P aortic valve replacement with stentless valve 03/18/2011    Past Surgical History:  Procedure Laterality Date   AORTIC ROOT REPLACEMENT  03/18/2011   216mMedtronic Freestyle Porcine aortic root with reimplantation of coronary arteries   BREAST LUMPECTOMY      Left   CATARACT EXTRACTION     CHOLECYSTECTOMY     FOOT SURGERY     Morton's neuroma   KNEE ARTHROSCOPY     Left    Family History  Problem Relation Age of Onset   Cancer Mother        bladder   Lung cancer Father    Heart disease Maternal Grandmother    Emphysema Maternal Aunt    Asthma Maternal Aunt    Asthma Maternal Uncle    Breast cancer Sister    Uterine cancer Sister     Social History   Socioeconomic History   Marital status: Widowed    Spouse name: Not on file   Number of children: N   Years of education: Not on file   Highest education level: Not on file  Occupational History   Occupation: housewife  Tobacco Use   Smoking status: Former Smoker    Packs/day: 0.90    Years: 62.00    Pack years: 55.80    Types: Cigarettes    Quit date: 07/14/2010    Years since quitting: 9.5   Smokeless tobacco: Never Used  Vaping Use   Vaping Use: Never used  Substance and Sexual Activity   Alcohol use: No    Alcohol/week: 0.0 standard drinks   Drug use: No   Sexual activity: Never  Other Topics Concern   Not on file  Social History Narrative   Not  on file   Social Determinants of Health   Financial Resource Strain:    Difficulty of Paying Living Expenses: Not on file  Food Insecurity:    Worried About [place] in the Last Year: Not on file   Ran Out of Food in the Last Year: Not on file  Transportation Needs:    Lack of Transportation (Medical): Not on file   Lack of Transportation (Non-Medical): Not on file  Physical Activity:    Days of Exercise per Week: Not on file   Minutes of Exercise per Session: Not on file  Stress:    Feeling of Stress : Not on file  Social Connections:    Frequency of Communication with Friends and Family: Not on file   Frequency of Social Gatherings with Friends and Family: Not on file   Attends Religious Services: Not on file   Active Member of Clubs or Organizations: Not on  file   Attends Archivist Meetings: Not on file   Marital Status: Not on file  Intimate Partner Violence:    Fear of Current or Ex-Partner: Not on file   Emotionally Abused: Not on file   Physically Abused: Not on file   Sexually Abused: Not on file    Outpatient Medications Prior to Visit  Medication Sig Dispense Refill   acetaminophen (TYLENOL) 500 MG tablet Take 500 mg by mouth every 6 (six) hours as needed. Take 3 tablets every 6 hours     albuterol (PROAIR HFA) 108 (90 Base) MCG/ACT inhaler Inhale 2 puffs into the lungs every 6 (six) hours as needed for wheezing or shortness of breath. 18 g 5   amitriptyline (ELAVIL) 10 MG tablet Take 1 tablet (10 mg total) by mouth at bedtime. 30 tablet 0   aspirin EC 81 MG tablet Take 81 mg by mouth daily.     buPROPion (WELLBUTRIN) 75 MG tablet Take 1 tablet (75 mg total) by mouth 2 (two) times daily. 1 tablet 0   isosorbide mononitrate (IMDUR) 30 MG 24 hr tablet Take 0.5 tablets (15 mg total) by mouth daily. 30 tablet 1   levothyroxine (SYNTHROID) 112 MCG tablet Take 1 tablet (112 mcg total) by mouth daily. 90 tablet 0   lidocaine (LIDODERM) 5 % Place 1 patch onto the skin every 12 (twelve) hours. Remove & Discard patch within 12 hours or as directed 30 patch 0   MELATONIN PO Take 1 tablet by mouth at bedtime.      methocarbamol (ROBAXIN) 500 MG tablet Take 1 tablet (500 mg total) by mouth 2 (two) times daily. 20 tablet 0   Omega-3 Fatty Acids (FISH OIL) 1000 MG CAPS Take 1,000 mg by mouth daily.     omeprazole (PRILOSEC) 40 MG capsule Take 1 capsule (40 mg total) by mouth daily. 30 capsule 3   oxyCODONE (ROXICODONE) 5 MG immediate release tablet Take 1 tablet (5 mg total) by mouth every 6 (six) hours as needed for severe pain. 20 tablet 0   OXYGEN Inhale 2 L into the lungs continuous.     predniSONE (DELTASONE) 10 MG tablet Take 1 tablet by mouth once daily with breakfast 30 tablet 3   simvastatin (ZOCOR) 20 MG  tablet Take 1 tablet (20 mg total) by mouth at bedtime. 90 tablet 1   STIOLTO RESPIMAT 2.5-2.5 MCG/ACT AERS INHALE 2 PUFFS BY MOUTH ONCE DAILY. PLEASE SEE DR FOR REFILLS. 4 g 12   amLODipine (NORVASC) 2.5 MG tablet Take 1 tablet (2.5  mg total) by mouth daily. 90 tablet 0   No facility-administered medications prior to visit.    Allergies  Allergen Reactions   Clarithromycin Other (See Comments)    Mouth/throat sore   Tamsulosin Itching   Trazodone And Nefazodone Other (See Comments)    Dizziness    Penicillins Itching and Rash    Did it involve swelling of the face/tongue/throat, SOB, or low BP? No Did it involve sudden or severe rash/hives, skin peeling, or any reaction on the inside of your mouth or nose? Yes Did you need to seek medical attention at a hospital or doctor's office? Yes When did it last happen?Young woman  If all above answers are NO, may proceed with cephalosporin use.     ROS Review of Systems Review of Systems:  A fourteen system review of systems was performed and found to be positive as per HPI.  Objective:    Physical Exam General:  Well Developed, in no acute distress with portable oxygen Neuro:  Alert and oriented,  extra-ocular muscles intact  HEENT:  Normocephalic, atraumatic, neck supple, no JVD Skin:  no gross rash, warm, pink. Cardiac:  RRR, S1 S2 Respiratory:  ECTA B/L and A/P, Not using accessory muscles, speaking in full sentences- unlabored. Vascular:  Ext warm, no cyanosis apprec.; cap RF less 2 sec. Psych:  No HI/SI, judgement and insight good, Euthymic mood. Full Affect.   BP (!) 145/68    Pulse 89    Temp 97.6 F (36.4 C) (Oral)    Ht _0  (1.626 m)    Wt 105 lb 3.2 oz (47.7 kg)    SpO2 90%    BMI 18.06 kg/m  Wt Readings from Last 3 Encounters:  01/05/20 105 lb 3.2 oz (47.7 kg)  11/30/19 104 lb 6.4 oz (47.4 kg)  10/04/19 105 lb (47.6 kg)     Health Maintenance Due  Topic Date Due   INFLUENZA VACCINE   12/17/2019    There are no preventive care reminders to display for this patient.  Lab Results  Component Value Date   TSH 0.792 06/25/2019   Lab Results  Component Value Date   WBC 7.2 08/27/2019   HGB 14.4 08/27/2019   HCT 45.4 08/27/2019   MCV 98.5 08/27/2019   PLT 193 08/27/2019   Lab Results  Component Value Date   NA 136 08/27/2019   K 4.7 08/27/2019   CO2 27 08/27/2019   GLUCOSE 124 (H) 08/27/2019   BUN 22 08/27/2019   CREATININE 1.29 (H) 08/27/2019   BILITOT 0.4 08/27/2019   ALKPHOS 76 08/27/2019   AST 22 08/27/2019   ALT 22 08/27/2019   PROT 6.9 08/27/2019   ALBUMIN 3.2 (L) 08/27/2019   CALCIUM 9.4 08/27/2019   ANIONGAP 10 08/27/2019   Lab Results  Component Value Date   CHOL 151 03/06/2019   Lab Results  Component Value Date   HDL 81 03/06/2019   Lab Results  Component Value Date   LDLCALC 54 03/06/2019   Lab Results  Component Value Date   TRIG 85 03/06/2019   Lab Results  Component Value Date   CHOLHDL 1.9 03/06/2019   Lab Results  Component Value Date   HGBA1C 5.5 03/06/2019      Assessment & Plan:   Problem List Items Addressed This Visit      Cardiovascular and Mediastinum   Essential hypertension     Musculoskeletal and Integument   Osteopenia after menopause   Relevant Orders   DG  Bone Density     Other   Iron deficiency anemia   Relevant Orders   Iron, TIBC and Ferritin Panel   Estrogen deficiency   Relevant Orders   DG Bone Density   Hyperlipidemia LDL goal <70 - Primary   Pulmonary nodule 1 cm or greater in diameter    Other Visit Diagnoses    Restless legs syndrome (RLS)       Relevant Orders   Iron, TIBC and Ferritin Panel   Post-menopausal       Relevant Orders   DG Bone Density     Essential hypertension: -BP mildly elevated -Continue current medication regimen.  Advised to continue ambulatory BP and pulse monitoring and to notify the clinic if BP consistently above 140/90. -Follow low-sodium  diet.  Hyperlipidemia: -Last lipid panel WNL -Continue current medication regimen. -Follow a heart healthy diet. -Advised to return for fasting blood work.  Restless leg syndrome, Iron deficiency anemia: -Recommend tobacco cessation so continue with Wellbutrin, local massage, gentle stretches, or cold compresses. -Advised okay to resume iron supplement and placed future lab order for iron panel.  -Will continue to monitor.  Osteopenia, estrogen deficiency, postmenopausal: -Last bone density was in November 2017 and revealed osteopenia. -Placed order for bone density and pending results will start most appropriate treatment. -Recommend calcium and vitamin D supplement.  Pulmonary nodule, COPD: -Followed by pulmonology. -Patient declined biopsy at this time and prefers conservative therapy.  Chest CT will be repeated in September to monitor for progression. -Continue current medication regimen for COPD. -Recommend tobacco cessation, which patient is taking Wellbutrin and is helping.   No orders of the defined types were placed in this encounter.   Follow-up: Return for HTN, Mood, HLD in 3-4 months; lab visit for FBW plus iron panel within next few weeks.   Note:  This note was prepared with assistance of Dragon voice recognition software. Occasional wrong-word or sound-a-like substitutions may have occurred due to the inherent limitations of voice recognition software.  [name], PA-C

## 2020-01-10 ENCOUNTER — Other Ambulatory Visit: Payer: Self-pay | Admitting: Physician Assistant

## 2020-01-25 ENCOUNTER — Other Ambulatory Visit: Payer: Self-pay | Admitting: Physician Assistant

## 2020-01-25 DIAGNOSIS — E038 Other specified hypothyroidism: Secondary | ICD-10-CM

## 2020-01-31 ENCOUNTER — Other Ambulatory Visit: Payer: Self-pay

## 2020-01-31 ENCOUNTER — Ambulatory Visit (INDEPENDENT_AMBULATORY_CARE_PROVIDER_SITE_OTHER)
Admission: RE | Admit: 2020-01-31 | Discharge: 2020-01-31 | Disposition: A | Discharge status: Home / Self Care | Payer: Medicare Other | Source: Ambulatory Visit | Attending: Emergency Medicine | Admitting: Emergency Medicine

## 2020-01-31 DIAGNOSIS — R911 Solitary pulmonary nodule: Secondary | ICD-10-CM | POA: Diagnosis not present

## 2020-01-31 DIAGNOSIS — J432 Centrilobular emphysema: Secondary | ICD-10-CM | POA: Diagnosis not present

## 2020-01-31 DIAGNOSIS — I700 Atherosclerosis of aorta: Secondary | ICD-10-CM | POA: Diagnosis not present

## 2020-01-31 DIAGNOSIS — I2510 Atherosclerotic heart disease of native coronary artery without angina pectoris: Secondary | ICD-10-CM | POA: Diagnosis not present

## 2020-01-31 DIAGNOSIS — J984 Other disorders of lung: Secondary | ICD-10-CM | POA: Diagnosis not present

## 2020-02-12 ENCOUNTER — Encounter: Payer: Self-pay | Admitting: Emergency Medicine

## 2020-02-12 ENCOUNTER — Other Ambulatory Visit: Payer: Self-pay

## 2020-02-12 ENCOUNTER — Ambulatory Visit: Payer: Medicare Other | Admitting: Emergency Medicine

## 2020-02-12 DIAGNOSIS — J9611 Chronic respiratory failure with hypoxia: Secondary | ICD-10-CM

## 2020-02-12 DIAGNOSIS — R911 Solitary pulmonary nodule: Secondary | ICD-10-CM | POA: Diagnosis not present

## 2020-02-12 DIAGNOSIS — J432 Centrilobular emphysema: Secondary | ICD-10-CM | POA: Diagnosis not present

## 2020-02-12 NOTE — Assessment & Plan Note (Signed)
Continue oxygen at 2 L/min

## 2020-02-12 NOTE — Assessment & Plan Note (Signed)
Right lower lobe pulmonary nodule, enlarging and now abutting the medial parietal pleura.  To date she has wanted to be conservative, any invasive procedures.  I did discuss with her today the possibility that she may be a candidate for SBRT even in the absence of a needle biopsy.  She is willing to have a PET scan and then discuss this further.  I will need to review her imaging with radiation oncology once the PET scan is done to see if they believe she is a good candidate even without a biopsy.  We will arrange for a PET scan to further evaluate your right lower lobe pulmonary nodule Depending on the scan results we will decide whether to discuss your case with radiation oncology, consider radiation treatment Follow with Dr Lamonte Sakai next available after your PET scan to review the results together.

## 2020-02-12 NOTE — Patient Instructions (Addendum)
We will arrange for a PET scan to further evaluate your right lower lobe pulmonary nodule Depending on the scan results we will decide whether to discuss your case with radiation oncology, consider radiation treatment Continue your Stiolto 2 puffs once daily Continue prednisone 10 mg daily Keep your albuterol available use 2 puffs when you need of shortness of breath Continue your oxygen at 2 L/min [place] excuse letter and [employer] paperwork completed today Follow with Dr Lamonte Sakai next available after your PET scan to review the results together.

## 2020-02-12 NOTE — Assessment & Plan Note (Signed)
Continue your Stiolto 2 puffs once daily Continue prednisone 10 mg daily Keep your albuterol available use 2 puffs when you need of shortness of breath Continue your oxygen at 2 L/min [place] excuse letter and [place] paperwork completed today

## 2020-02-12 NOTE — Progress Notes (Signed)
Subjective:    Patient ID: [name], female    DOB: 05/28/1929, 86 y.o.   MRN: [id number]  HPI Hospital follow-up 07/24/2019 --86 year old woman with a history of paroxysmal atrial fibrillation, aortic valve replacement, hypertension, followed here for severe COPD with associated hypoxemic respiratory failure, hypercalcemia.  She was admitted for severe HTN and associated "indigestion" early February. Cardiac eval was reassuring.  Part of her evaluation included a CT scan of the chest 06/25/2019 which I have reviewed, shows calcified pleural plaquing and a 2.4 x 1.5 cm spiculated right lower lobe mass lesion concerning for possible malignancy. She wasn't having any cough, CP, sputum when she was admitted.   Currently on prednisone 57m, stiolto, albuterol which she uses rarely  ROV 11/30/19 --follow-up visit for 86year old woman with atrial fibrillation, history of AVR, hypertension.  I follow her for severe COPD with associated hypoxemic respiratory failure as well as suspected asbestos exposure with an abnormal CT chest with pleural plaquing and a 2.4 x 1.8 spiculated right lower lobe mass concerning for possible malignancy, reimaged 08/16/19.  We have not committed to biopsy or therapy for the mass lesion to date. She reports today that her breathing has been worse - she is more SOB with any activity. Her POC is on 2L/min at all times. She remains on Stiolto, feels that she is benefiting, uses albuterol 1-2x a day. Minimal cough. No wheeze. She is having progressive tremor when she tries to write.   ROV 02/12/20 --follow-up visit for 86year old woman with hypertension, AVR, atrial fibrillation, asbestos exposure with pleural plaquing by CT chest.  Also with severe COPD and associated hypoxemic respiratory failure.  There is a right lower lobe spiculated mass that is suspicious for malignancy but we decided to defer any plans for biopsy.  Last time I increased her daily prednisone to 10 mg to see if  she would get benefit.  She is been having progressive dyspnea - she isn't sure that the increased pred helped her breathing much.  Otherwise managed on Stiolto, uses albuterol occasionally, usually with exertion.  Oxygen at 2 L/min  Repeat CT scan of the chest 01/31/2020 reviewed by me, shows interval increase in size of her right lower lobe superior segmental mass, now 3.0 x 2.9 cm, abuts the medial pleura.  Her other plaques and calcifications are unchanged.  There is no mediastinal or hilar adenopathy  Review of Systems As per HPi     Objective:   Physical Exam Vitals:   02/12/20 1133  BP: 114/66  Pulse: 87  Temp: (!) 97.2 F (36.2 C)  TempSrc: Temporal  SpO2: 96%  Weight: 107 lb 8 oz (48.8 kg)  Height: 5' 4.5" (1.638 m)   Gen: Pleasant, thin, in no distress,  normal affect  ENT: No lesions,  mouth clear,  oropharynx clear, no postnasal drip  Neck: No JVD, no stridor  Lungs: No use of accessory muscles, no crackles or wheezing on normal respiration, no wheeze on forced expiration  Cardiovascular: RRR, heart sounds normal, no murmur or gallops, no peripheral edema  Musculoskeletal: No deformities, no cyanosis or clubbing  Neuro: alert, awake, non focal  Skin: Warm, no lesions or rash     Assessment & Plan:  Chronic obstructive pulmonary disease (HCC) Continue your Stiolto 2 puffs once daily Continue prednisone 10 mg daily Keep your albuterol available use 2 puffs when you need of shortness of breath Continue your oxygen at 2 L/min [place] excuse letter and [employer] paperwork completed  today  Pulmonary nodule 1 cm or greater in diameter Right lower lobe pulmonary nodule, enlarging and now abutting the medial parietal pleura.  To date she has wanted to be conservative, any invasive procedures.  I did discuss with her today the possibility that she may be a candidate for SBRT even in the absence of a needle biopsy.  She is willing to have a PET scan and then discuss this  further.  I will need to review her imaging with radiation oncology once the PET scan is done to see if they believe she is a good candidate even without a biopsy.  We will arrange for a PET scan to further evaluate your right lower lobe pulmonary nodule Depending on the scan results we will decide whether to discuss your case with radiation oncology, consider radiation treatment Follow with Dr Lamonte Sakai next available after your PET scan to review the results together.  Chronic respiratory failure with hypoxia ([place]) Continue oxygen at 2 L/min  Baltazar Apo, MD, PhD 02/12/2020, 12:02 PM [facility] [phone] or if no answer [phone]

## 2020-02-22 ENCOUNTER — Encounter (HOSPITAL_COMMUNITY)
Admission: RE | Admit: 2020-02-22 | Discharge: 2020-02-22 | Disposition: A | Discharge status: Home / Self Care | Payer: Medicare Other | Source: Ambulatory Visit | Attending: Emergency Medicine | Admitting: Emergency Medicine

## 2020-02-22 ENCOUNTER — Other Ambulatory Visit: Payer: Self-pay

## 2020-02-22 DIAGNOSIS — C3490 Malignant neoplasm of unspecified part of unspecified bronchus or lung: Secondary | ICD-10-CM | POA: Diagnosis not present

## 2020-02-22 DIAGNOSIS — R911 Solitary pulmonary nodule: Secondary | ICD-10-CM | POA: Insufficient documentation

## 2020-02-22 DIAGNOSIS — M4319 Spondylolisthesis, multiple sites in spine: Secondary | ICD-10-CM | POA: Diagnosis not present

## 2020-02-22 DIAGNOSIS — N1330 Unspecified hydronephrosis: Secondary | ICD-10-CM | POA: Diagnosis not present

## 2020-02-22 DIAGNOSIS — M5134 Other intervertebral disc degeneration, thoracic region: Secondary | ICD-10-CM | POA: Diagnosis not present

## 2020-02-22 LAB — GLUCOSE, CAPILLARY: Glucose-Capillary: 91 mg/dL (ref 70–99)

## 2020-02-22 MED ORDER — FLUDEOXYGLUCOSE F - 18 (FDG) INJECTION
5.4000 | Freq: Once | INTRAVENOUS | Status: AC
  Administered 2020-02-22: 5.4 via INTRAVENOUS

## 2020-03-06 ENCOUNTER — Other Ambulatory Visit: Payer: Self-pay

## 2020-03-06 ENCOUNTER — Encounter: Payer: Self-pay | Admitting: Emergency Medicine

## 2020-03-06 ENCOUNTER — Ambulatory Visit: Payer: Medicare Other | Admitting: Emergency Medicine

## 2020-03-06 DIAGNOSIS — J9611 Chronic respiratory failure with hypoxia: Secondary | ICD-10-CM | POA: Diagnosis not present

## 2020-03-06 DIAGNOSIS — R911 Solitary pulmonary nodule: Secondary | ICD-10-CM | POA: Diagnosis not present

## 2020-03-06 DIAGNOSIS — J432 Centrilobular emphysema: Secondary | ICD-10-CM

## 2020-03-06 NOTE — Assessment & Plan Note (Signed)
Predominant nodule is 2.6 cm on the right, she also has a small nodule on the left. Both are hypermetabolic on PET scan, consistent with synchronous primaries. She is a poor candidate for bronchoscopy or needle biopsy. She is willing to discuss possible empiric radiation therapy with the radiation oncologists if they believe she is a good candidate. I will discuss with them and then revisit with her.

## 2020-03-06 NOTE — Progress Notes (Signed)
Subjective:    Patient ID: [name], female    DOB: [birth year], 86 y.o.   MRN: [id number]  HPI Hospital follow-up 07/24/2019 --86 year old woman with a history of paroxysmal atrial fibrillation, aortic valve replacement, hypertension, followed here for severe COPD with associated hypoxemic respiratory failure, hypercalcemia.  She was admitted for severe HTN and associated "indigestion" early February. Cardiac eval was reassuring.  Part of her evaluation included a CT scan of the chest 06/25/2019 which I have reviewed, shows calcified pleural plaquing and a 2.4 x 1.5 cm spiculated right lower lobe mass lesion concerning for possible malignancy. She wasn't having any cough, CP, sputum when she was admitted.   Currently on prednisone 73m, stiolto, albuterol which she uses rarely  ROV 11/30/19 --follow-up visit for 86year old woman with atrial fibrillation, history of AVR, hypertension.  I follow her for severe COPD with associated hypoxemic respiratory failure as well as suspected asbestos exposure with an abnormal CT chest with pleural plaquing and a 2.4 x 1.8 spiculated right lower lobe mass concerning for possible malignancy, reimaged 08/16/19.  We have not committed to biopsy or therapy for the mass lesion to date. She reports today that her breathing has been worse - she is more SOB with any activity. Her POC is on 2L/min at all times. She remains on Stiolto, feels that she is benefiting, uses albuterol 1-2x a day. Minimal cough. No wheeze. She is having progressive tremor when she tries to write.   ROV 02/12/20 --follow-up visit for 86year old woman with hypertension, AVR, atrial fibrillation, asbestos exposure with pleural plaquing by CT chest.  Also with severe COPD and associated hypoxemic respiratory failure.  There is a right lower lobe spiculated mass that is suspicious for malignancy but we decided to defer any plans for biopsy.  Last time I increased her daily prednisone to 10 mg to see if  she would get benefit.  She is been having progressive dyspnea - she isn't sure that the increased pred helped her breathing much.  Otherwise managed on Stiolto, uses albuterol occasionally, usually with exertion.  Oxygen at 2 L/min  Repeat CT scan of the chest 01/31/2020 reviewed by me, shows interval increase in size of her right lower lobe superior segmental mass, now 3.0 x 2.9 cm, abuts the medial pleura.  Her other plaques and calcifications are unchanged.  There is no mediastinal or hilar adenopathy  ROV 03/06/20 --[name] is 89 follows up today for her right lower lobe spiculated mass.  She also has a history of severe COPD with associated hypoxemic respiratory failure, hypertension, AVR, atrial fibrillation, pleural plaques with asbestos exposure.  She is on chronic prednisone, Stiolto, oxygen at 2 L/min. PET scan done on 02/22/2020 reviewed by me, showed hypermetabolism in her 2.6 x 2.6 right lower lobe nodule that abuts the pleural surface.  She also has a hypermetabolic left upper lobe 8 mm nodule consistent with synchronous primaries. Discussed with her today - she would be willing to speak with radiation oncology to consider XRT without a tissue dx.   Review of Systems As per HPi     Objective:   Physical Exam Vitals:   03/06/20 1135  BP: 122/70  Pulse: 88  Temp: (!) 97.3 F (36.3 C)  TempSrc: Temporal  SpO2: 93%  Weight: 108 lb 12.8 oz (49.4 kg)  Height: 5' 4.5" (1.638 m)   Gen: Pleasant, thin, in no distress,  normal affect  ENT: No lesions,  mouth clear,  oropharynx clear, no postnasal  drip  Neck: No JVD, no stridor  Lungs: No use of accessory muscles, no crackles or wheezing on normal respiration, no wheeze on forced expiration  Cardiovascular: RRR, heart sounds normal, no murmur or gallops, no peripheral edema  Musculoskeletal: No deformities, no cyanosis or clubbing  Neuro: alert, awake, non focal  Skin: Warm, no lesions or rash     Assessment & Plan:    Chronic obstructive pulmonary disease (HCC) Continue Stiolto Continue albuterol if needed Follow with Dr Lamonte Sakai in 1 month or next available.  Chronic respiratory failure with hypoxia (HCC) Continue oxygen as she has been wearing it.  Pulmonary nodule 1 cm or greater in diameter Predominant nodule is 2.6 cm on the right, she also has a small nodule on the left. Both are hypermetabolic on PET scan, consistent with synchronous primaries. She is a poor candidate for bronchoscopy or needle biopsy. She is willing to discuss possible empiric radiation therapy with the radiation oncologists if they believe she is a good candidate. I will discuss with them and then revisit with her.  Baltazar Apo, MD, PhD 03/06/2020, 11:52 AM [facility] [phone] or if no answer [phone]

## 2020-03-06 NOTE — Assessment & Plan Note (Signed)
Continue Stiolto Continue albuterol if needed Follow with Dr Lamonte Sakai in 1 month or next available.

## 2020-03-06 NOTE — Patient Instructions (Signed)
Dr. Lamonte Sakai will discuss your PET scan, CT scans with radiation oncology to see if you are a candidate for treatment. Continue Stiolto Continue albuterol if needed Continue your oxygen as you have been using it Follow with Dr Lamonte Sakai in 1 month or next available.

## 2020-03-06 NOTE — Assessment & Plan Note (Signed)
Continue oxygen as she has been wearing it.

## 2020-04-01 ENCOUNTER — Ambulatory Visit
Admission: EM | Admit: 2020-04-01 | Discharge: 2020-04-01 | Disposition: A | Discharge status: Home / Self Care | Payer: Medicare Other | Attending: Family Medicine | Admitting: Family Medicine

## 2020-04-01 ENCOUNTER — Encounter: Payer: Self-pay | Admitting: Emergency Medicine

## 2020-04-01 ENCOUNTER — Other Ambulatory Visit: Payer: Self-pay

## 2020-04-01 DIAGNOSIS — H6521 Chronic serous otitis media, right ear: Secondary | ICD-10-CM

## 2020-04-01 MED ORDER — FLUTICASONE PROPIONATE 50 MCG/ACT NA SUSP: 1.0000 | Freq: Every day | NASAL | 2 refills | Status: DC

## 2020-04-01 MED ORDER — SULFAMETHOXAZOLE-TRIMETHOPRIM 800-160 MG PO TABS: 1.0000 | ORAL_TABLET | Freq: Two times a day (BID) | ORAL | 0 refills | Status: AC

## 2020-04-01 NOTE — ED Triage Notes (Signed)
R ear pain and sore throat since Friday. She put peroxide and alcohol in her ear without relief.

## 2020-04-01 NOTE — ED Provider Notes (Signed)
[facility]    CSN: [id number] Arrival date & time: 04/01/20  1145      History   Chief Complaint Chief Complaint  Patient presents with  . Otalgia  . Sore Throat    HPI [name] is a 86 y.o. female.   Patient complains of right ear pain and right sided sore throat.  Symptoms been present x3 days.  No fever.  She is tried some over-the-counter topical solutions without effect.  HPI  Past Medical History:  Diagnosis Date  . Allergic rhinitis   . Aortic stenosis   . Carotid artery disease ([place])   . Colon polyps   . h/o Atrial fibrillation ([place]) 03/22/2011   Brief post op   . Hematuria 11/05/2015  . Hypercalcemia   . Hypertension   . Hypothyroid   . Osteopenia   . 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 Pressure urticaria   . RLS (restless legs syndrome)   . S/P aortic valve replacement and aortoplasty 04/13/2011   46m Medtronic Freestyle porcine aortic root   . S/P aortic valve replacement with stentless valve 03/18/2011    Patient Active Problem List   Diagnosis Date Noted  . Pulmonary nodule 1 cm or greater in diameter 07/24/2019  . Nonintractable headache 07/04/2019  . Suppurative otitis media of right ear 07/04/2019  . Hospital discharge follow-up 07/04/2019  . Hydronephrosis 06/26/2019  . Chest pain in adult 06/25/2019  . Hypertensive urgency   . History of aortic valve replacement   . Hyperlipidemia LDL goal <70   . New daily persistent headache-  now resolved 02/28/2019  . Stage 3b chronic kidney disease ([place] 02/28/2019  . Prediabetes 02/28/2019  . History of iron deficiency anemia 02/28/2019  . Urinary frequency 02/28/2019  . Dysthymia 02/28/2019  . Unintentional weight loss 02/09/2019  . Chronic respiratory failure with hypoxia ([place] 02/09/2019  . Insomnia 04/28/2018  . Iron deficiency anemia 04/28/2018  . Estrogen deficiency 04/28/2018  . H. pylori infection w/ GERD sx 10/19/2017  . Essential hypertension 10/19/2017  . Medication  monitoring encounter 07/07/2017  . Family history of breast cancer in sister 07/07/2017  . Family history of bladder cancer-in mother 07/07/2017  . Osteopenia after menopause 07/07/2017  . Chest pain 07/07/2017  . History of smoking   ( 43 yrs at 0.5ppd) ;1991- until 2012 ( 8 cig/day )   02/09/2017  . Dyspnea 03/11/2016  . Elevated hemoglobin A1c 11/13/2015  . Hematuria 11/05/2015  . Hypothyroidism; on synthroid 11/05/2015  . Osteopenia 11/05/2015  . h/o Hypercalcemia 11/05/2015  . h/o RLS (restless legs syndrome) 11/05/2015  . h/o Intention tremor  11/05/2015  . History of nephrolithiasis 11/05/2015  . S/P aortic valve replacement and aortoplasty 04/13/2011  . h/o Atrial fibrillation ([place] 03/22/2011  . S/P aortic valve replacement with stentless valve 03/18/2011  . Carotid artery disease ([place]   . Hypertension   . Chronic obstructive pulmonary disease ([place] 03/02/2011    Past Surgical History:  Procedure Laterality Date  . AORTIC ROOT REPLACEMENT  03/18/2011   233mMedtronic Freestyle Porcine aortic root with reimplantation of coronary arteries  . BREAST LUMPECTOMY     Left  . CATARACT EXTRACTION    . CHOLECYSTECTOMY    . FOOT SURGERY     Morton's neuroma  . KNEE ARTHROSCOPY     Left    OB History   No obstetric history on file.      Home Medications    Prior to Admission medications  Medication Sig Start Date End Date Taking? Authorizing Provider  acetaminophen (TYLENOL) 500 MG tablet Take 500 mg by mouth every 6 (six) hours as needed. Take 3 tablets every 6 hours    [provider]  albuterol (PROAIR HFA) 108 (90 Base) MCG/ACT inhaler Inhale 2 puffs into the lungs every 6 (six) hours as needed for wheezing or shortness of breath. 11/30/19   Collene Gobble, MD  amitriptyline (ELAVIL) 10 MG tablet Take 1 tablet (10 mg total) by mouth at bedtime. Patient not taking: Reported on 03/06/2020 10/04/19   Izora Ribas, MD  amLODipine (NORVASC) 2.5 MG  tablet Take 1 tablet by mouth once daily 01/10/20   [name], PA-C  aspirin EC 81 MG tablet Take 81 mg by mouth daily.    [provider]  fluticasone (FLONASE) 50 MCG/ACT nasal spray Place 1 spray into both nostrils daily. 04/01/20   Wardell Honour, MD  isosorbide mononitrate (IMDUR) 30 MG 24 hr tablet Take 0.5 tablets (15 mg total) by mouth daily. Patient not taking: Reported on 03/06/2020 06/27/19   Radene Gunning, NP  levothyroxine (SYNTHROID) 112 MCG tablet Take 1 tablet by mouth once daily 01/25/20   Abonza, Maritza, PA-C  MELATONIN PO Take 1 tablet by mouth at bedtime.  Patient not taking: Reported on 03/06/2020    [provider]  Omega-3 Fatty Acids (FISH OIL) 1000 MG CAPS Take 1,000 mg by mouth daily.    [provider]  omeprazole (PRILOSEC) 40 MG capsule Take 1 capsule (40 mg total) by mouth daily. Patient not taking: Reported on 03/06/2020 09/25/19   Willia Craze, NP  OXYGEN Inhale 2 L into the lungs continuous.    [provider]  predniSONE (DELTASONE) 10 MG tablet Take 1 tablet by mouth once daily with breakfast 11/30/19   Collene Gobble, MD  simvastatin (ZOCOR) 20 MG tablet Take 1 tablet (20 mg total) by mouth at bedtime. 12/13/19   Abonza, Maritza, PA-C  STIOLTO RESPIMAT 2.5-2.5 MCG/ACT AERS INHALE 2 PUFFS BY MOUTH ONCE DAILY. PLEASE SEE DR FOR REFILLS. 09/15/19   Collene Gobble, MD  sulfamethoxazole-trimethoprim (BACTRIM DS) 800-160 MG tablet Take 1 tablet by mouth 2 (two) times daily for 7 days. 04/01/20 04/08/20  Wardell Honour, MD    Family History Family History  Problem Relation Age of Onset  . Cancer Mother        bladder  . Lung cancer Father   . Heart disease Maternal Grandmother   . Emphysema Maternal Aunt   . Asthma Maternal Aunt   . Asthma Maternal Uncle   . Breast cancer Sister   . Uterine cancer Sister     Social History Social History   Tobacco Use  . Smoking status: Former Smoker    Packs/day: 0.90      Years: 62.00    Pack years: 55.80    Types: Cigarettes    Quit date: 07/14/2010    Years since quitting: 9.7  . Smokeless tobacco: Never Used  Vaping Use  . Vaping Use: Never used  Substance Use Topics  . Alcohol use: No    Alcohol/week: 0.0 standard drinks  . Drug use: No     Allergies   Clarithromycin, Tamsulosin, Trazodone and nefazodone, and Penicillins   Review of Systems Review of Systems  Constitutional: Negative for fever.  HENT: Positive for ear pain and sore throat.   All other systems reviewed and are negative.    Physical Exam Triage  Vital Signs ED Triage Vitals  Enc Vitals Group     BP 04/01/20 1231 132/64     Pulse Rate 04/01/20 1231 71     Resp 04/01/20 1231 (!) 22     Temp 04/01/20 1231 98.2 F (36.8 C)     Temp Source 04/01/20 1231 Oral     SpO2 04/01/20 1231 94 %     Weight --      Height --      Head Circumference --      Peak Flow --      Pain Score 04/01/20 1229 7     Pain Loc --      Pain Edu? --      Excl. in [place]? --    No data found.  Updated Vital Signs BP 132/64 (BP Location: Left Arm)   Pulse 71   Temp 98.2 F (36.8 C) (Oral)   Resp (!) 22   SpO2 94%   Visual Acuity Right Eye Distance:   Left Eye Distance:   Bilateral Distance:    Right Eye Near:   Left Eye Near:    Bilateral Near:     Physical Exam Vitals and nursing note reviewed.  Constitutional:      Appearance: She is well-developed.  HENT:     Head: Normocephalic.     Ears:     Comments: Both TMs are dull without normal light reflex    Mouth/Throat:     Mouth: Mucous membranes are moist.  Pulmonary:     Effort: Pulmonary effort is normal.  Neurological:     Mental Status: She is alert and oriented to person, place, and time.      UC Treatments / Results  Labs (all labs ordered are listed, but only abnormal results are displayed) Labs Reviewed - No data to display  EKG   Radiology No results found.  Procedures Procedures (including  critical care time)  Medications Ordered in UC Medications - No data to display  Initial Impression / Assessment and Plan / UC Course  I have reviewed the triage vital signs and the nursing notes.  Pertinent labs & imaging results that were available during my care of the patient were reviewed by me and considered in my medical decision making (see chart for details).     Serous effusion probably caused by eustachian tube dysfunction with pharyngitis Final Clinical Impressions(s) / UC Diagnoses   Final diagnoses:  Right chronic serous otitis media   Discharge Instructions   None    ED Prescriptions    Medication Sig Dispense Auth. Provider   sulfamethoxazole-trimethoprim (BACTRIM DS) 800-160 MG tablet Take 1 tablet by mouth 2 (two) times daily for 7 days. 14 tablet Wardell Honour, MD   fluticasone [facility]) 50 MCG/ACT nasal spray Place 1 spray into both nostrils daily. 9.9 mL Wardell Honour, MD     PDMP not reviewed this encounter.   Wardell Honour, MD 04/01/20 1245

## 2020-04-10 ENCOUNTER — Encounter: Payer: Self-pay | Admitting: Emergency Medicine

## 2020-04-10 ENCOUNTER — Other Ambulatory Visit: Payer: Self-pay

## 2020-04-10 ENCOUNTER — Ambulatory Visit: Payer: Medicare Other | Admitting: Emergency Medicine

## 2020-04-10 DIAGNOSIS — J9611 Chronic respiratory failure with hypoxia: Secondary | ICD-10-CM | POA: Diagnosis not present

## 2020-04-10 DIAGNOSIS — J432 Centrilobular emphysema: Secondary | ICD-10-CM

## 2020-04-10 DIAGNOSIS — R911 Solitary pulmonary nodule: Secondary | ICD-10-CM

## 2020-04-10 NOTE — Patient Instructions (Addendum)
Please continue Stiolto 2 puffs once a day. Keep albuterol available use either 2 puffs or 1 nebulizer treatment up to every 4 hours if needed for shortness of breath, chest tightness, wheezing. Continue prednisone 10 mg once daily.  Depending on how your breathing is doing next time we may decide to increase this. Continue your oxygen.  Use 2 L/min at rest but increase to 3 L/min when you are up moving around or exerting yourself in any way. We will refer you to meet Radiation Oncology to discuss possible treatment of your pulmonary nodules Follow with Dr. Lamonte Sakai in 2 months or sooner if you have any problems.

## 2020-04-10 NOTE — Assessment & Plan Note (Addendum)
Severe COPD, severe obstruction, approaching end-stage.  She probably has benefited some from the increase in prednisone in July, now with more exertional symptoms, decreased functional capacity.  She has noticed desaturations and we will adjust oxygen as below.  If she continues to have declining functional capacity with better oxygen saturations then we may need to consider going up on the prednisone.  Please continue Stiolto 2 puffs once a day. Keep albuterol available use either 2 puffs or 1 nebulizer treatment up to every 4 hours if needed for shortness of breath, chest tightness, wheezing. Continue prednisone 10 mg once daily.  Depending on how your breathing is doing next time we may decide to increase this. Follow with Dr. Lamonte Sakai in 2 months or sooner if you have any problems.

## 2020-04-10 NOTE — Assessment & Plan Note (Signed)
She has seen desaturations with minimal activity at home on 2 L/min continuous.  Plan to increase to 3 L/min when she is up exerting, continue 2 L/min while at rest.

## 2020-04-10 NOTE — Addendum Note (Signed)
Addended by: Gavin Potters R on: 04/10/2020 12:03 PM   Modules accepted: Orders

## 2020-04-10 NOTE — Progress Notes (Signed)
Subjective:    Patient ID: [name], female    DOB: [birth year], 86 y.o.   MRN: [id number]  HPI  ROV 03/06/20 --[name] is 86, follows up today for her right lower lobe spiculated mass.  She also has a history of severe COPD with associated hypoxemic respiratory failure, hypertension, AVR, atrial fibrillation, pleural plaques with asbestos exposure.  She is on chronic prednisone, Stiolto, oxygen at 2 L/min. PET scan done on 02/22/2020 reviewed by me, showed hypermetabolism in her 2.6 x 2.6 right lower lobe nodule that abuts the pleural surface.  She also has a hypermetabolic left upper lobe 8 mm nodule consistent with synchronous primaries. Discussed with her today - she would be willing to speak with radiation oncology to consider XRT without a tissue dx.   ROV 04/10/20 --follow-up visit for patient with a history of severe COPD, hypertension, A. fib, AVR.  She has hypoxemic respiratory failure in the setting of this.  She is on chronic prednisone now 45m since July, oxygen at 2 L/min.  She also has some pleural plaques and pulmonary nodular disease on CT chest, documented asbestos exposure.  Last time we discussed the possibility of referral to radiation oncology for possible empiric therapy. Currently managed on Stiolto, uses albuterol about 1-2x a day, has HFA and nebs. She has noticed increase exertional SOB with activity. She has trouble getting around the house and has seen desaturations on 2L/min to low 80's. Has difficulty taking a deep breath.     Review of Systems As per HPi     Objective:   Physical Exam Vitals:   04/10/20 1136  BP: 138/76  Pulse: 86  Temp: 97.7 F (36.5 C)  SpO2: 98%  Weight: 111 lb 6.4 oz (50.5 kg)  Height: _0  (1.626 m)   Gen: Pleasant, thin, in no distress,  normal affect  ENT: No lesions,  mouth clear,  oropharynx clear, no postnasal drip  Neck: No JVD, no stridor  Lungs: No use of accessory muscles, no crackles or wheezing on normal  respiration, no wheeze on forced expiration  Cardiovascular: RRR, heart sounds normal, no murmur or gallops, no peripheral edema  Musculoskeletal: No deformities, no cyanosis or clubbing  Neuro: alert, awake, non focal  Skin: Warm, no lesions or rash     Assessment & Plan:  Chronic obstructive pulmonary disease (HCC) Severe COPD, severe obstruction, approaching end-stage.  She probably has benefited some from the increase in prednisone in July, now with more exertional symptoms, decreased functional capacity.  She has noticed desaturations and we will adjust oxygen as below.  If she continues to have declining functional capacity with better oxygen saturations then we may need to consider going up on the prednisone.  Please continue Stiolto 2 puffs once a day. Keep albuterol available use either 2 puffs or 1 nebulizer treatment up to every 4 hours if needed for shortness of breath, chest tightness, wheezing. Continue prednisone 10 mg once daily.  Depending on how your breathing is doing next time we may decide to increase this. Follow with Dr. BLamonte Sakaiin 2 months or sooner if you have any problems.  Chronic respiratory failure with hypoxia (HCC) She has seen desaturations with minimal activity at home on 2 L/min continuous.  Plan to increase to 3 L/min when she is up exerting, continue 2 L/min while at rest.  Pulmonary nodule 1 cm or greater in diameter Pulmonary nodules, hypermetabolic by PET scan.  I discussed at thoracic conference.  She  is a poor candidate for biopsy but may be a candidate for empiric radiation therapy.  She is willing to hear the pros and cons.  I will refer her to radiation oncology today.  Baltazar Apo, MD, PhD 04/10/2020, 11:58 AM  Pulmonary and Critical Care [phone] or if no answer [phone]

## 2020-04-10 NOTE — Assessment & Plan Note (Signed)
Pulmonary nodules, hypermetabolic by PET scan.  I discussed at thoracic conference.  She is a poor candidate for biopsy but may be a candidate for empiric radiation therapy.  She is willing to hear the pros and cons.  I will refer her to radiation oncology today.

## 2020-04-16 ENCOUNTER — Other Ambulatory Visit: Payer: Self-pay | Admitting: Physician Assistant

## 2020-04-17 NOTE — Progress Notes (Signed)
Thoracic Location of Tumor / Histology:  RIGHT lower lobe nodule consistent with bronchogenic carcinoma (measures 2.6 x 2.6 cm and has intense metabolic activity with SUV max equal 11.6)  Patient presented with symptoms of: Severe COPD, severe obstruction, approaching end-stage. She sees Dr. Baltazar Apo (Pulmonologist) who has been managing her condition, and monitoring the right lower lobe nodule (patient opted for conservative treatment back in July)  PET Scan 02/22/2020 IMPRESSION: 1. Large hypermetabolic nodule in the superior segment of the RIGHT lower lobe consistent bronchogenic carcinoma. No evidence of mediastinal nodal metastasis. 2. Small hypermetabolic nodule in the LEFT upper lobe. Favor synchronous bronchogenic carcinoma. 3. No distant metastatic disease 4. RIGHT hydronephrosis and pelvis distension unchanged from comparison exams.  Biopsies revealed:  N/A: patient is considered a poor candidate   Tobacco/Marijuana/Snuff/ETOH use:  Reports that she quit smoking about 8 years ago. Her smoking use included cigarettes. She has a 55.80 pack-year smoking history. She has never used smokeless tobacco. She reports that she does not drink alcohol or use drugs.  Past/Anticipated interventions by cardiothoracic surgery, if any:  No surgical intervention recommended currently   Past/Anticipated interventions by medical oncology, if any:  No referral placed at this time  Signs/Symptoms  Weight changes, if any: no  Respiratory complaints, if any: on 02 3L  Hemoptysis, if any: no  Pain issues, if any:  no  SAFETY ISSUES:  Prior radiation? no  Pacemaker/ICD? no  Possible current pregnancy? No--postmenopausal  Is the patient on methotrexate? no  Current Complaints / other details:  BP 122/67 (BP Location: Left Arm, Patient Position: Sitting, Cuff Size: Normal)   Pulse 78   Temp 97.6 F (36.4 C)   Resp 20   Ht _0  (1.626 m)   Wt 115 lb (52.2 kg)   SpO2 98%   PF  (!) 3 L/min   BMI 19.74 kg/m  Filed Weights   04/18/20 1349  Weight: 115 lb (52.2 kg)   Patient in with her sister to discuss radiation for lung cancer. No chemotherapy at this time.

## 2020-04-18 ENCOUNTER — Ambulatory Visit
Admission: RE | Admit: 2020-04-18 | Discharge: 2020-04-18 | Disposition: A | Discharge status: Home / Self Care | Payer: Medicare Other | Source: Ambulatory Visit | Attending: Radiation Oncology | Admitting: Radiation Oncology

## 2020-04-18 ENCOUNTER — Other Ambulatory Visit: Payer: Self-pay

## 2020-04-18 ENCOUNTER — Encounter: Payer: Self-pay | Admitting: Radiation Oncology

## 2020-04-18 VITALS — BP 122/67 | HR 78 | Temp 97.6°F | Resp 20 | Ht 64.0 in | Wt 115.0 lb

## 2020-04-18 DIAGNOSIS — I4891 Unspecified atrial fibrillation: Secondary | ICD-10-CM | POA: Insufficient documentation

## 2020-04-18 DIAGNOSIS — R911 Solitary pulmonary nodule: Secondary | ICD-10-CM | POA: Insufficient documentation

## 2020-04-18 DIAGNOSIS — E039 Hypothyroidism, unspecified: Secondary | ICD-10-CM | POA: Insufficient documentation

## 2020-04-18 DIAGNOSIS — I10 Essential (primary) hypertension: Secondary | ICD-10-CM | POA: Diagnosis not present

## 2020-04-18 DIAGNOSIS — Z87891 Personal history of nicotine dependence: Secondary | ICD-10-CM | POA: Diagnosis not present

## 2020-04-18 DIAGNOSIS — G2581 Restless legs syndrome: Secondary | ICD-10-CM | POA: Diagnosis not present

## 2020-04-18 DIAGNOSIS — Z8719 Personal history of other diseases of the digestive system: Secondary | ICD-10-CM | POA: Insufficient documentation

## 2020-04-18 DIAGNOSIS — Z8052 Family history of malignant neoplasm of bladder: Secondary | ICD-10-CM | POA: Diagnosis not present

## 2020-04-18 DIAGNOSIS — M8580 Other specified disorders of bone density and structure, unspecified site: Secondary | ICD-10-CM | POA: Diagnosis not present

## 2020-04-18 DIAGNOSIS — I350 Nonrheumatic aortic (valve) stenosis: Secondary | ICD-10-CM | POA: Diagnosis not present

## 2020-04-18 DIAGNOSIS — Z7982 Long term (current) use of aspirin: Secondary | ICD-10-CM | POA: Diagnosis not present

## 2020-04-18 DIAGNOSIS — Z79899 Other long term (current) drug therapy: Secondary | ICD-10-CM | POA: Diagnosis not present

## 2020-04-18 DIAGNOSIS — Z803 Family history of malignant neoplasm of breast: Secondary | ICD-10-CM | POA: Diagnosis not present

## 2020-04-18 HISTORY — DX: Malignant neoplasm of lower lobe, unspecified bronchus or lung: C34.30

## 2020-04-18 NOTE — Progress Notes (Signed)
Radiation Oncology         [phone] ________________________________  Initial Outpatient Consultation  Name: [name] MRN: [id number]  Date: 04/18/2020  DOB: [birth year]  JO:ITGPQD, Herb Grays, PA-C  Byrum, Rose Fillers, MD   REFERRING PHYSICIAN: Collene Gobble, MD  DIAGNOSIS: The encounter diagnosis was Pulmonary nodule 1 cm or greater in diameter.  PET-avid right lower lobe lung nodule consistent with primary bronchogenic carcinoma; possible left upper lobe synchronous bronchogenic carcinoma  HISTORY OF PRESENT ILLNESS::[name] is a 86 y.o. female who is seen as a courtesy of Dr. Lamonte Sakai for an opinion concerning radiation therapy as part of management for her recently diagnosed lung cancer. Today, she is accompanied by her sister who I have treated in the past for breast cancer.. The patient presented to the ED on 06/25/2019 with chief complaint of chest pain. CTA of chest/abdomen/pelvia at that time showed a 2.4 cm spiculated mass in the right lower lobe that was concerning for malignancy.  Subsequently, the patient was referred to Dr. Lamonte Sakai and seen on 07/24/2019. At that time, they discussed conservative versus aggressive evaluation. The patient was noted not to be a surgical candidate. After considering bronchoscopy, TTNA, and serial CT scans, the patient opted to proceed with the conservative method.  Repeat chest CT scan on 01/31/2020 showed an interval increase in the size of the spiculated mass of the superior segment of the right lower lobe that measured 3.0 x 2.9 cm as compared to 2.4 x 1.8 cm previously. There was also significant abutment of the medial pleura. Findings were consistent with primary lung malignancy. There was no evidence of metastatic disease in the chest.   PET scan on 02/22/2020 showed a large hypermetabolic nodule in the superior segment of the right lower lobe that was consistent with bronchogenic carcinoma. There was no evidence of mediastinal nodal  metastasis. Additionally, there was a small hypermetabolic nodule in the left upper lobe that favored synchronous bronchogenic carcinoma. There was no distant metastatic disease.  The patient was last seen by Dr. Lamonte Sakai on 04/10/2020, during which time she was noted to be a poor candidate for biopsy but could be considered for empiric radiation therapy.  PREVIOUS RADIATION THERAPY: No  PAST MEDICAL HISTORY:  Past Medical History:  Diagnosis Date  . Allergic rhinitis   . Aortic stenosis   . Carotid artery disease ([place])   . Colon polyps   . h/o Atrial fibrillation ([place]) 03/22/2011   Brief post op   . Hematuria 11/05/2015  . Hypercalcemia   . Hypertension   . Hypothyroid   . Lung cancer, lower lobe ([place])   . Osteopenia   . 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 Pressure urticaria   . RLS (restless legs syndrome)   . S/P aortic valve replacement and aortoplasty 04/13/2011   4m Medtronic Freestyle porcine aortic root   . S/P aortic valve replacement with stentless valve 03/18/2011    PAST SURGICAL HISTORY: Past Surgical History:  Procedure Laterality Date  . AORTIC ROOT REPLACEMENT  03/18/2011   237mMedtronic Freestyle Porcine aortic root with reimplantation of coronary arteries  . BREAST LUMPECTOMY     Left  . CATARACT EXTRACTION    . CHOLECYSTECTOMY    . FOOT SURGERY     Morton's neuroma  . KNEE ARTHROSCOPY     Left    FAMILY HISTORY:  Family History  Problem Relation Age of Onset  . Cancer Mother        bladder  .  Lung cancer Father   . Heart disease Maternal Grandmother   . Emphysema Maternal Aunt   . Asthma Maternal Aunt   . Asthma Maternal Uncle   . Breast cancer Sister   . Uterine cancer Sister     SOCIAL HISTORY:  Social History   Tobacco Use  . Smoking status: Former Smoker    Packs/day: 0.90    Years: 62.00    Pack years: 55.80    Types: Cigarettes    Quit date: 07/14/2010    Years since quitting: 9.7  . Smokeless tobacco: Never Used  Vaping Use  .  Vaping Use: Never used  Substance Use Topics  . Alcohol use: No    Alcohol/week: 0.0 standard drinks  . Drug use: No    ALLERGIES:  Allergies  Allergen Reactions  . Clarithromycin Other (See Comments)    Mouth/throat sore  . Tamsulosin Itching  . Trazodone And Nefazodone Other (See Comments)    Dizziness   . Penicillins Itching and Rash    Did it involve swelling of the face/tongue/throat, SOB, or low BP? No Did it involve sudden or severe rash/hives, skin peeling, or any reaction on the inside of your mouth or nose? Yes Did you need to seek medical attention at a hospital or doctor's office? Yes When did it last happen?Young woman  If all above answers are "NO", may proceed with cephalosporin use.     MEDICATIONS:  Current Outpatient Medications  Medication Sig Dispense Refill  . acetaminophen (TYLENOL) 500 MG tablet Take 500 mg by mouth every 6 (six) hours as needed. Take 3 tablets every 6 hours    . albuterol (PROAIR HFA) 108 (90 Base) MCG/ACT inhaler Inhale 2 puffs into the lungs every 6 (six) hours as needed for wheezing or shortness of breath. 18 g 5  . amitriptyline (ELAVIL) 10 MG tablet Take 1 tablet (10 mg total) by mouth at bedtime. 30 tablet 0  . amLODipine (NORVASC) 2.5 MG tablet Take 1 tablet by mouth once daily 90 tablet 0  . aspirin EC 81 MG tablet Take 81 mg by mouth daily.    . fluticasone (FLONASE) 50 MCG/ACT nasal spray Place 1 spray into both nostrils daily. 9.9 mL 2  . levothyroxine (SYNTHROID) 112 MCG tablet Take 1 tablet by mouth once daily 90 tablet 0  . MELATONIN PO Take 1 tablet by mouth at bedtime.     . Omega-3 Fatty Acids (FISH OIL) 1000 MG CAPS Take 1,000 mg by mouth daily.    Marland Kitchen omeprazole (PRILOSEC) 40 MG capsule Take 1 capsule (40 mg total) by mouth daily. 30 capsule 3  . OXYGEN Inhale 2 L into the lungs continuous.    . predniSONE (DELTASONE) 10 MG tablet Take 1 tablet by mouth once daily with breakfast 30 tablet 3  . simvastatin (ZOCOR)  20 MG tablet Take 1 tablet (20 mg total) by mouth at bedtime. 90 tablet 1  . STIOLTO RESPIMAT 2.5-2.5 MCG/ACT AERS INHALE 2 PUFFS BY MOUTH ONCE DAILY. PLEASE SEE DR FOR REFILLS. 4 g 12  . isosorbide mononitrate (IMDUR) 30 MG 24 hr tablet Take 0.5 tablets (15 mg total) by mouth daily. (Patient not taking: Reported on 03/06/2020) 30 tablet 1   No current facility-administered medications for this encounter.    REVIEW OF SYSTEMS:  A 10+ POINT REVIEW OF SYSTEMS WAS OBTAINED including neurology, dermatology, psychiatry, cardiac, respiratory, lymph, extremities, GI, GU, musculoskeletal, constitutional, reproductive, HEENT.  She over the past 2 to 3 months is  noticed some worsening of her breathing.  She has recently been bumped up from 2 to 3 L and moving around.  She is comfortable on 2 L at rest.  She is also on prednisone which helps her breathing.  She denies any pain within the chest area significant cough or hemoptysis.   PHYSICAL EXAM:  height is _0  (1.626 m) and weight is 115 lb (52.2 kg). Her temperature is 97.6 F (36.4 C). Her blood pressure is 122/67 and her pulse is 78. Her respiration is 20 and oxygen saturation is 98%.   General: Alert and oriented, in no acute distress, supplemental oxygen in place at 3 L HEENT: Head is normocephalic. Extraocular movements are intact.  Neck: Neck is supple, no palpable cervical or supraclavicular lymphadenopathy. Heart: Regular in rate and rhythm with no murmurs, rubs, or gallops. Chest: Clear to auscultation bilaterally, with no rhonchi, wheezes, or rales.  Prior CABG Abdomen: Soft, nontender, nondistended, with no rigidity or guarding. Extremities: No cyanosis or edema. Lymphatics: see Neck Exam Skin: No concerning lesions. Musculoskeletal: symmetric strength and muscle tone throughout. Neurologic: Cranial nerves II through XII are grossly intact. No obvious focalities. Speech is fluent. Coordination is intact. Psychiatric: Judgment and insight  are intact. Affect is appropriate.   ECOG = 1  0 - Asymptomatic (Fully active, able to carry on all predisease activities without restriction)  1 - Symptomatic but completely ambulatory (Restricted in physically strenuous activity but ambulatory and able to carry out work of a light or sedentary nature. For example, light housework, office work)  2 - Symptomatic, <50% in bed during the day (Ambulatory and capable of all self care but unable to carry out any work activities. Up and about more than 50% of waking hours)  3 - Symptomatic, >50% in bed, but not bedbound (Capable of only limited self-care, confined to bed or chair 50% or more of waking hours)  4 - Bedbound (Completely disabled. Cannot carry on any self-care. Totally confined to bed or chair)  5 - Death   Eustace Pen MM, Creech RH, Tormey DC, et al. [phone]). "Toxicity and response criteria of the [facility]". [place] Oncol. 5 (6): 649-55  LABORATORY DATA:  Lab Results  Component Value Date   WBC 7.2 08/27/2019   HGB 14.4 08/27/2019   HCT 45.4 08/27/2019   MCV 98.5 08/27/2019   PLT 193 08/27/2019   NEUTROABS 6.9 06/25/2019   Lab Results  Component Value Date   NA 136 08/27/2019   K 4.7 08/27/2019   CL 99 08/27/2019   CO2 27 08/27/2019   GLUCOSE 124 (H) 08/27/2019   CREATININE 1.29 (H) 08/27/2019   CALCIUM 9.4 08/27/2019      RADIOGRAPHY: No results found.    IMPRESSION: PET-avid right lower lobe lung nodule consistent with primary bronchogenic carcinoma; possible left upper lobe synchronous bronchogenic carcinoma  I reviewed the patient's PET scan carefully with the patient and her sister.  Discussed options to consider, one will be watchful waiting however over a period of 6 months this tumor has enlarged significantly and I would not be in favor of this approach.  Patient would appear to be a candidate for SBRT directed at the lesion in the right lung.  In light of patient's respiratory  status and the small size of the lesion in the left upper lobe I would not recommend treatments to both areas at this time but to follow-up the left upper lobe lesion with serial scans.  We also discussed that her breathing could potentially worsen from her radiation therapy given the anticipated scar tissue around the tumor vicinity.  After careful evaluation the patient would like to proceed with SBRT directed to the larger lesion in the right lower lobe.    We discussed the natural history of lung cancer and general treatment, highlighting the role of radiotherapy in the management.  We discussed the available radiation techniques, and focused on the details of logistics and delivery.  We reviewed the anticipated acute and late sequelae associated with radiation in this setting.  The patient was encouraged to ask questions that I answered to the best of my ability.  A patient consent form was discussed and signed.  We retained a copy for our records.  The patient would like to proceed with radiation and will be scheduled for CT simulation.  PLAN: The patient will return on December at 2 PM for SBRT simulation.  Anticipate between 3 and 5 treatments directed at the medially  based right lower lobe lesion.  Total time spent in this encounter was 60 minutes which included reviewing the patient's most recent chest CT scans, ED visits, consultations, follow-ups, PET scan, physical examination, and documentation.  ------------------------------------------------  Blair Promise, PhD, MD  This document serves as a record of services personally performed by Gery Pray, MD. It was created on his behalf by Clerance Lav, a trained medical scribe. The creation of this record is based on the scribe's personal observations and the provider's statements to them. This document has been checked and approved by the attending provider.

## 2020-04-24 ENCOUNTER — Other Ambulatory Visit: Payer: Self-pay

## 2020-04-24 ENCOUNTER — Encounter: Payer: Self-pay | Admitting: Physician Assistant

## 2020-04-24 ENCOUNTER — Ambulatory Visit (INDEPENDENT_AMBULATORY_CARE_PROVIDER_SITE_OTHER): Payer: Medicare Other | Admitting: Physician Assistant

## 2020-04-24 VITALS — BP 144/54 | HR 72 | Ht 64.0 in | Wt 114.7 lb

## 2020-04-24 DIAGNOSIS — I10 Essential (primary) hypertension: Secondary | ICD-10-CM

## 2020-04-24 DIAGNOSIS — G4700 Insomnia, unspecified: Secondary | ICD-10-CM

## 2020-04-24 DIAGNOSIS — E038 Other specified hypothyroidism: Secondary | ICD-10-CM | POA: Diagnosis not present

## 2020-04-24 DIAGNOSIS — R519 Headache, unspecified: Secondary | ICD-10-CM | POA: Diagnosis not present

## 2020-04-24 DIAGNOSIS — H6690 Otitis media, unspecified, unspecified ear: Secondary | ICD-10-CM

## 2020-04-24 DIAGNOSIS — R600 Localized edema: Secondary | ICD-10-CM | POA: Diagnosis not present

## 2020-04-24 MED ORDER — DOXYCYCLINE HYCLATE 100 MG PO TABS: 100.0000 mg | ORAL_TABLET | Freq: Two times a day (BID) | ORAL | 0 refills | Status: DC

## 2020-04-24 NOTE — Progress Notes (Signed)
Acute Office Visit  Subjective:    Patient ID: [name], female    DOB: [birth year], 86 y.o.   MRN: [id number]  Chief Complaint  Patient presents with  . Edema    HPI Patient is in today for lower extremity edema. Patient states over the weekend lower legs and feet were swollen but symptoms have improved. Denies starting new medications, increased sodium consumption, trauma or injury, increased shortness of breath from baseline, chest pain, palpitations or fever. Also reports mild dull headache x 4-5 days and usually wakes up in the morning with it. Has taken Tylenol which does provides pain relief. No headache today. Reports chronic insomnia and takes melatonin which is ineffective. Patient has history of otitis media without ear complaints and only symptom is headache. Denies otalgia. Reports good hydration.  Past Medical History:  Diagnosis Date  . Allergic rhinitis   . Aortic stenosis   . Carotid artery disease ([place])   . Colon polyps   . h/o Atrial fibrillation ([place]) 03/22/2011   Brief post op   . Hematuria 11/05/2015  . Hypercalcemia   . Hypertension   . Hypothyroid   . Lung cancer, lower lobe ([place])   . Osteopenia   . 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 Pressure urticaria   . RLS (restless legs syndrome)   . S/P aortic valve replacement and aortoplasty 04/13/2011   54m Medtronic Freestyle porcine aortic root   . S/P aortic valve replacement with stentless valve 03/18/2011    Past Surgical History:  Procedure Laterality Date  . AORTIC ROOT REPLACEMENT  03/18/2011   243mMedtronic Freestyle Porcine aortic root with reimplantation of coronary arteries  . BREAST LUMPECTOMY     Left  . CATARACT EXTRACTION    . CHOLECYSTECTOMY    . FOOT SURGERY     Morton's neuroma  . KNEE ARTHROSCOPY     Left    Family History  Problem Relation Age of Onset  . Cancer Mother        bladder  . Lung cancer Father   . Heart disease Maternal Grandmother   . Emphysema Maternal Aunt    . Asthma Maternal Aunt   . Asthma Maternal Uncle   . Breast cancer Sister   . Uterine cancer Sister     Social History   Socioeconomic History  . Marital status: Widowed    Spouse name: Not on file  . Number of children: N  . Years of education: Not on file  . Highest education level: Not on file  Occupational History  . Occupation: housewife  Tobacco Use  . Smoking status: Former Smoker    Packs/day: 0.90    Years: 62.00    Pack years: 55.80    Types: Cigarettes    Quit date: 07/14/2010    Years since quitting: 9.7  . Smokeless tobacco: Never Used  Vaping Use  . Vaping Use: Never used  Substance and Sexual Activity  . Alcohol use: No    Alcohol/week: 0.0 standard drinks  . Drug use: No  . Sexual activity: Never  Other Topics Concern  . Not on file  Social History Narrative  . Not on file   Social Determinants of Health   Financial Resource Strain:   . Difficulty of Paying Living Expenses: Not on file  Food Insecurity:   . Worried About RuCharity fundraisern the Last Year: Not on file  . Ran Out of Food in the Last Year: Not on file  Transportation Needs:   . Film/video editor (Medical): Not on file  . Lack of Transportation (Non-Medical): Not on file  Physical Activity:   . Days of Exercise per Week: Not on file  . Minutes of Exercise per Session: Not on file  Stress:   . Feeling of Stress : Not on file  Social Connections:   . Frequency of Communication with Friends and Family: Not on file  . Frequency of Social Gatherings with Friends and Family: Not on file  . Attends Religious Services: Not on file  . Active Member of Clubs or Organizations: Not on file  . Attends Archivist Meetings: Not on file  . Marital Status: Not on file  Intimate Partner Violence:   . Fear of Current or Ex-Partner: Not on file  . Emotionally Abused: Not on file  . Physically Abused: Not on file  . Sexually Abused: Not on file    Outpatient Medications  Prior to Visit  Medication Sig Dispense Refill  . acetaminophen (TYLENOL) 500 MG tablet Take 500 mg by mouth every 6 (six) hours as needed. Take 3 tablets every 6 hours    . albuterol (PROAIR HFA) 108 (90 Base) MCG/ACT inhaler Inhale 2 puffs into the lungs every 6 (six) hours as needed for wheezing or shortness of breath. 18 g 5  . amitriptyline (ELAVIL) 10 MG tablet Take 1 tablet (10 mg total) by mouth at bedtime. 30 tablet 0  . amLODipine (NORVASC) 2.5 MG tablet Take 1 tablet by mouth once daily 90 tablet 0  . aspirin EC 81 MG tablet Take 81 mg by mouth daily.    . fluticasone (FLONASE) 50 MCG/ACT nasal spray Place 1 spray into both nostrils daily. 9.9 mL 2  . isosorbide mononitrate (IMDUR) 30 MG 24 hr tablet Take 0.5 tablets (15 mg total) by mouth daily. (Patient not taking: Reported on 03/06/2020) 30 tablet 1  . levothyroxine (SYNTHROID) 112 MCG tablet Take 1 tablet by mouth once daily 90 tablet 0  . MELATONIN PO Take 1 tablet by mouth at bedtime.     . Omega-3 Fatty Acids (FISH OIL) 1000 MG CAPS Take 1,000 mg by mouth daily.    Marland Kitchen omeprazole (PRILOSEC) 40 MG capsule Take 1 capsule (40 mg total) by mouth daily. 30 capsule 3  . OXYGEN Inhale 2 L into the lungs continuous.    . predniSONE (DELTASONE) 10 MG tablet Take 1 tablet by mouth once daily with breakfast 30 tablet 3  . simvastatin (ZOCOR) 20 MG tablet Take 1 tablet (20 mg total) by mouth at bedtime. 90 tablet 1  . STIOLTO RESPIMAT 2.5-2.5 MCG/ACT AERS INHALE 2 PUFFS BY MOUTH ONCE DAILY. PLEASE SEE DR FOR REFILLS. 4 g 12   No facility-administered medications prior to visit.    Allergies  Allergen Reactions  . Clarithromycin Other (See Comments)    Mouth/throat sore  . Tamsulosin Itching  . Trazodone And Nefazodone Other (See Comments)    Dizziness   . Penicillins Itching and Rash    Did it involve swelling of the face/tongue/throat, SOB, or low BP? No Did it involve sudden or severe rash/hives, skin peeling, or any reaction on  the inside of your mouth or nose? Yes Did you need to seek medical attention at a hospital or doctor's office? Yes When did it last happen?Young woman  If all above answers are "NO", may proceed with cephalosporin use.     Review of Systems A fourteen system review of systems was  performed and found to be positive as per HPI.    Objective:    Physical Exam General: Pleasant and cooperative, in no acute distress.  Neuro:  Alert and oriented,  extra-ocular muscles intact  HEENT:  Normocephalic, atraumatic, opaque TM of right ear, normal TM of left ear (mild scar tissue noted of both ears), normal external canal of both ears, negative Tragus sign both ears, neck supple  Skin:  no gross rash. No erythema of lower extremities. Multiple bruises noted on upper extremities (on aspirin).  Cardiac: Irregular Rhythm (hx of PVCs) , S1 S2 Respiratory:  ECTA B/L w/o crackles or wheezing, Not using accessory muscles, speaking in full sentences (has portable oxygen). MSK: No TTP of lower extremity bilaterally. Good pedal pulses.  Vascular:  Ext warm, no cyanosis apprec. Mild edema of lower extremity, L>R Psych:  No HI/SI, judgement and insight good, Euthymic mood. Full Affect.  BP (!) 144/54   Pulse 72   Ht _0  (1.626 m)   Wt 114 lb 11.2 oz (52 kg)   BMI 19.69 kg/m  Wt Readings from Last 3 Encounters:  04/24/20 114 lb 11.2 oz (52 kg)  04/18/20 115 lb (52.2 kg)  04/10/20 111 lb 6.4 oz (50.5 kg)    There are no preventive care reminders to display for this patient.  There are no preventive care reminders to display for this patient.   Lab Results  Component Value Date   TSH 0.792 06/25/2019   Lab Results  Component Value Date   WBC 7.2 08/27/2019   HGB 14.4 08/27/2019   HCT 45.4 08/27/2019   MCV 98.5 08/27/2019   PLT 193 08/27/2019   Lab Results  Component Value Date   NA 136 08/27/2019   K 4.7 08/27/2019   CO2 27 08/27/2019   GLUCOSE 124 (H) 08/27/2019   BUN 22  08/27/2019   CREATININE 1.29 (H) 08/27/2019   BILITOT 0.4 08/27/2019   ALKPHOS 76 08/27/2019   AST 22 08/27/2019   ALT 22 08/27/2019   PROT 6.9 08/27/2019   ALBUMIN 3.2 (L) 08/27/2019   CALCIUM 9.4 08/27/2019   ANIONGAP 10 08/27/2019   Lab Results  Component Value Date   CHOL 151 03/06/2019   Lab Results  Component Value Date   HDL 81 03/06/2019   Lab Results  Component Value Date   LDLCALC 54 03/06/2019   Lab Results  Component Value Date   TRIG 85 03/06/2019   Lab Results  Component Value Date   CHOLHDL 1.9 03/06/2019   Lab Results  Component Value Date   HGBA1C 5.5 03/06/2019       Assessment & Plan:   Problem List Items Addressed This Visit      Cardiovascular and Mediastinum   Essential hypertension   Relevant Orders   Comp Met (CMET)   CBC w/Diff     Endocrine   Hypothyroidism; on synthroid (Chronic)   Relevant Orders   TSH     Other   Insomnia   Nonintractable headache    Other Visit Diagnoses    Bilateral lower extremity edema    -  Primary   Relevant Orders   Comp Met (CMET)   CBC w/Diff   B Nat Peptide   TSH   Acute otitis media, unspecified otitis media type       Relevant Medications   doxycycline (VIBRA-TABS) 100 MG tablet     Bilateral lower extremity edema: -Symptoms have improved. -Recommend elevation, use compression socks and continue low  sodium diet. -Will place lab orders to evaluate for possible etiologies such as CHF, declined renal disease, liver disease or infectious etiology. -Patient prefers to wait on venous ultrasound to rule out DVT, which is reasonable. Advised if edema fails to resolve or worsens will proceed with imaging studies. Patient verbalized understanding and agreeable.  Hypothyroidism:  -Last TSH 0.792, wnl. -Repeating TSH today, especially with new symptom of edema. -Continue current medication regimen. Pending results will adjust medication dose if indicated.  Essential hypertension: -BP  initially elevated and BP recheck significantly improved (111/58). -Continue current medication regimen. -Continue good hydration.  -Will continue to monitor.  Acute headache, Acute otitis media, unspecified otitis media type:  -Will start antibiotic therapy to treat AOM and recommend to continue Tylenol as needed for headache.  Insomnia: -Patient was started on amitriptyline 10 mg by Physical Medicine to help with insomnia and LUQ. Recommend to consider increasing dose to 25 mg given insomnia has not improved. Due to potential cardiovascular side effects would limit higher doses.   Meds ordered this encounter  Medications  . doxycycline (VIBRA-TABS) 100 MG tablet    Sig: Take 1 tablet (100 mg total) by mouth 2 (two) times daily.    Dispense:  20 tablet    Refill:  0    Order Specific Question:   Supervising Provider    Answer:   Beatrice Lecher D [[id number]     [name], PA-C

## 2020-04-24 NOTE — Patient Instructions (Signed)
Edema  Edema is when you have too much fluid in your body or under your skin. Edema may make your legs, feet, and ankles swell up. Swelling is also common in looser tissues, like around your eyes. This is a common condition. It gets more common as you get older. There are many possible causes of edema. Eating too much salt (sodium) and being on your feet or sitting for a long time can cause edema in your legs, feet, and ankles. Hot weather may make edema worse. Edema is usually painless. Your skin may look swollen or shiny. Follow these instructions at home:  Keep the swollen body part raised (elevated) above the level of your heart when you are sitting or lying down.  Do not sit still or stand for a long time.  Do not wear tight clothes. Do not wear garters on your upper legs.  Exercise your legs. This can help the swelling go down.  Wear elastic bandages or support stockings as told by your doctor.  Eat a low-salt (low-sodium) diet to reduce fluid as told by your doctor.  Depending on the cause of your swelling, you may need to limit how much fluid you drink (fluid restriction).  Take over-the-counter and prescription medicines only as told by your doctor. Contact a doctor if:  Treatment is not working.  You have heart, liver, or kidney disease and have symptoms of edema.  You have sudden and unexplained weight gain. Get help right away if:  You have shortness of breath or chest pain.  You cannot breathe when you lie down.  You have pain, redness, or warmth in the swollen areas.  You have heart, liver, or kidney disease and get edema all of a sudden.  You have a fever and your symptoms get worse all of a sudden. Summary  Edema is when you have too much fluid in your body or under your skin.  Edema may make your legs, feet, and ankles swell up. Swelling is also common in looser tissues, like around your eyes.  Raise (elevate) the swollen body part above the level of your  heart when you are sitting or lying down.  Follow your doctor's instructions about diet and how much fluid you can drink (fluid restriction). This information is not intended to replace advice given to you by your health care provider. Make sure you discuss any questions you have with your health care provider. Document Revised: 05/07/2017 Document Reviewed: 05/22/2016 Elsevier Patient Education  2020 [employer].

## 2020-04-25 ENCOUNTER — Ambulatory Visit
Admission: RE | Admit: 2020-04-25 | Discharge: 2020-04-25 | Disposition: A | Discharge status: Home / Self Care | Payer: Medicare Other | Source: Ambulatory Visit | Attending: Radiation Oncology | Admitting: Radiation Oncology

## 2020-04-25 DIAGNOSIS — Z87891 Personal history of nicotine dependence: Secondary | ICD-10-CM | POA: Diagnosis not present

## 2020-04-25 DIAGNOSIS — R911 Solitary pulmonary nodule: Secondary | ICD-10-CM | POA: Insufficient documentation

## 2020-04-25 DIAGNOSIS — Z510 Encounter for antineoplastic radiation therapy: Secondary | ICD-10-CM | POA: Diagnosis not present

## 2020-04-25 LAB — CBC WITH DIFFERENTIAL/PLATELET
Basophils Absolute: 0 10*3/uL (ref 0.0–0.2)
Basos: 0 %
EOS (ABSOLUTE): 0.2 10*3/uL (ref 0.0–0.4)
Eos: 3 %
Hematocrit: 42.4 % (ref 34.0–46.6)
Hemoglobin: 14.6 g/dL (ref 11.1–15.9)
Immature Grans (Abs): 0 10*3/uL (ref 0.0–0.1)
Immature Granulocytes: 0 %
Lymphocytes Absolute: 2 10*3/uL (ref 0.7–3.1)
Lymphs: 29 %
MCH: 32.2 pg (ref 26.6–33.0)
MCHC: 34.4 g/dL (ref 31.5–35.7)
MCV: 94 fL (ref 79–97)
Monocytes Absolute: 0.9 10*3/uL (ref 0.1–0.9)
Monocytes: 14 %
Neutrophils Absolute: 3.7 10*3/uL (ref 1.4–7.0)
Neutrophils: 54 %
Platelets: 174 10*3/uL (ref 150–450)
RBC: 4.53 x10E6/uL (ref 3.77–5.28)
RDW: 11.8 % (ref 11.7–15.4)
WBC: 6.8 10*3/uL (ref 3.4–10.8)

## 2020-04-25 LAB — COMPREHENSIVE METABOLIC PANEL
ALT: 21 IU/L (ref 0–32)
AST: 21 IU/L (ref 0–40)
Albumin/Globulin Ratio: 1.5 (ref 1.2–2.2)
Albumin: 3.7 g/dL (ref 3.6–4.6)
Alkaline Phosphatase: 56 IU/L (ref 44–121)
BUN/Creatinine Ratio: 16 (ref 12–28)
BUN: 19 mg/dL (ref 8–27)
Bilirubin Total: 0.2 mg/dL (ref 0.0–1.2)
CO2: 31 mmol/L — ABNORMAL HIGH (ref 20–29)
Calcium: 9.7 mg/dL (ref 8.7–10.3)
Chloride: 97 mmol/L (ref 96–106)
Creatinine, Ser: 1.17 mg/dL — ABNORMAL HIGH (ref 0.57–1.00)
GFR calc Af Amer: 48 mL/min/{1.73_m2} — ABNORMAL LOW (ref >59)
GFR calc non Af Amer: 42 mL/min/{1.73_m2} — ABNORMAL LOW (ref >59)
Globulin, Total: 2.5 g/dL (ref 1.5–4.5)
Glucose: 116 mg/dL — ABNORMAL HIGH (ref 65–99)
Potassium: 3.9 mmol/L (ref 3.5–5.2)
Sodium: 142 mmol/L (ref 134–144)
Total Protein: 6.2 g/dL (ref 6.0–8.5)

## 2020-04-25 LAB — BRAIN NATRIURETIC PEPTIDE

## 2020-04-25 LAB — TSH: TSH: 0.984 u[IU]/mL (ref 0.450–4.500)

## 2020-04-25 LAB — SPECIMEN STATUS REPORT

## 2020-04-29 ENCOUNTER — Other Ambulatory Visit: Payer: Self-pay | Admitting: Physician Assistant

## 2020-04-29 ENCOUNTER — Other Ambulatory Visit: Payer: Self-pay | Admitting: Emergency Medicine

## 2020-04-29 DIAGNOSIS — Z510 Encounter for antineoplastic radiation therapy: Secondary | ICD-10-CM | POA: Diagnosis not present

## 2020-04-29 DIAGNOSIS — R911 Solitary pulmonary nodule: Secondary | ICD-10-CM | POA: Diagnosis not present

## 2020-04-29 DIAGNOSIS — Z87891 Personal history of nicotine dependence: Secondary | ICD-10-CM | POA: Diagnosis not present

## 2020-04-29 DIAGNOSIS — E038 Other specified hypothyroidism: Secondary | ICD-10-CM

## 2020-04-29 DIAGNOSIS — J438 Other emphysema: Secondary | ICD-10-CM

## 2020-05-02 ENCOUNTER — Ambulatory Visit
Admission: RE | Admit: 2020-05-02 | Discharge: 2020-05-02 | Disposition: A | Discharge status: Home / Self Care | Payer: Medicare Other | Source: Ambulatory Visit | Attending: Radiation Oncology | Admitting: Radiation Oncology

## 2020-05-02 ENCOUNTER — Other Ambulatory Visit: Payer: Self-pay

## 2020-05-02 DIAGNOSIS — Z510 Encounter for antineoplastic radiation therapy: Secondary | ICD-10-CM | POA: Diagnosis not present

## 2020-05-02 DIAGNOSIS — R911 Solitary pulmonary nodule: Secondary | ICD-10-CM

## 2020-05-06 ENCOUNTER — Ambulatory Visit
Admission: RE | Admit: 2020-05-06 | Discharge: 2020-05-06 | Disposition: A | Discharge status: Home / Self Care | Payer: Medicare Other | Source: Ambulatory Visit | Attending: Radiation Oncology | Admitting: Radiation Oncology

## 2020-05-06 ENCOUNTER — Other Ambulatory Visit: Payer: Self-pay

## 2020-05-06 DIAGNOSIS — R911 Solitary pulmonary nodule: Secondary | ICD-10-CM | POA: Diagnosis not present

## 2020-05-06 DIAGNOSIS — Z510 Encounter for antineoplastic radiation therapy: Secondary | ICD-10-CM | POA: Diagnosis not present

## 2020-05-07 ENCOUNTER — Ambulatory Visit: Payer: Medicare Other | Admitting: Radiation Oncology

## 2020-05-08 ENCOUNTER — Other Ambulatory Visit: Payer: Self-pay

## 2020-05-08 ENCOUNTER — Ambulatory Visit
Admission: RE | Admit: 2020-05-08 | Discharge: 2020-05-08 | Disposition: A | Discharge status: Home / Self Care | Payer: Medicare Other | Source: Ambulatory Visit | Attending: Radiation Oncology | Admitting: Radiation Oncology

## 2020-05-08 DIAGNOSIS — R911 Solitary pulmonary nodule: Secondary | ICD-10-CM | POA: Diagnosis not present

## 2020-05-08 DIAGNOSIS — Z510 Encounter for antineoplastic radiation therapy: Secondary | ICD-10-CM | POA: Diagnosis not present

## 2020-05-09 ENCOUNTER — Ambulatory Visit: Payer: Medicare Other | Admitting: Radiation Oncology

## 2020-05-13 ENCOUNTER — Other Ambulatory Visit: Payer: Self-pay

## 2020-05-13 ENCOUNTER — Ambulatory Visit
Admission: RE | Admit: 2020-05-13 | Discharge: 2020-05-13 | Disposition: A | Discharge status: Home / Self Care | Payer: Medicare Other | Source: Ambulatory Visit | Attending: Radiation Oncology | Admitting: Radiation Oncology

## 2020-05-13 DIAGNOSIS — Z510 Encounter for antineoplastic radiation therapy: Secondary | ICD-10-CM | POA: Diagnosis not present

## 2020-05-13 DIAGNOSIS — R911 Solitary pulmonary nodule: Secondary | ICD-10-CM | POA: Diagnosis not present

## 2020-05-14 ENCOUNTER — Ambulatory Visit: Payer: Medicare Other | Admitting: Radiation Oncology

## 2020-05-15 ENCOUNTER — Ambulatory Visit
Admission: RE | Admit: 2020-05-15 | Discharge: 2020-05-15 | Disposition: A | Discharge status: Home / Self Care | Payer: Medicare Other | Source: Ambulatory Visit | Attending: Radiation Oncology | Admitting: Radiation Oncology

## 2020-05-15 ENCOUNTER — Other Ambulatory Visit: Payer: Self-pay

## 2020-05-15 ENCOUNTER — Encounter: Payer: Self-pay | Admitting: Radiation Oncology

## 2020-05-15 DIAGNOSIS — Z510 Encounter for antineoplastic radiation therapy: Secondary | ICD-10-CM | POA: Diagnosis not present

## 2020-05-15 DIAGNOSIS — R911 Solitary pulmonary nodule: Secondary | ICD-10-CM | POA: Diagnosis not present

## 2020-05-15 DIAGNOSIS — Z87891 Personal history of nicotine dependence: Secondary | ICD-10-CM | POA: Diagnosis not present

## 2020-05-16 ENCOUNTER — Ambulatory Visit: Payer: Medicare Other | Admitting: Radiation Oncology

## 2020-05-18 DIAGNOSIS — J449 Chronic obstructive pulmonary disease, unspecified: Secondary | ICD-10-CM | POA: Diagnosis not present

## 2020-05-18 DIAGNOSIS — J438 Other emphysema: Secondary | ICD-10-CM | POA: Diagnosis not present

## 2020-05-21 ENCOUNTER — Other Ambulatory Visit: Payer: Self-pay | Admitting: Physician Assistant

## 2020-05-21 DIAGNOSIS — R600 Localized edema: Secondary | ICD-10-CM

## 2020-05-22 ENCOUNTER — Other Ambulatory Visit: Payer: Self-pay

## 2020-05-22 ENCOUNTER — Other Ambulatory Visit: Payer: Medicare Other

## 2020-05-22 DIAGNOSIS — R600 Localized edema: Secondary | ICD-10-CM | POA: Diagnosis not present

## 2020-05-23 LAB — BRAIN NATRIURETIC PEPTIDE: BNP: 803.4 pg/mL — ABNORMAL HIGH (ref 0.0–100.0)

## 2020-05-24 ENCOUNTER — Telehealth: Payer: Self-pay | Admitting: Physician Assistant

## 2020-05-24 DIAGNOSIS — R600 Localized edema: Secondary | ICD-10-CM

## 2020-05-24 MED ORDER — FUROSEMIDE 20 MG PO TABS: 20.0000 mg | ORAL_TABLET | Freq: Two times a day (BID) | ORAL | 0 refills | Status: DC

## 2020-05-24 NOTE — Telephone Encounter (Signed)
Rx for Lasix 20 mg BID  [name], PA-C

## 2020-05-31 ENCOUNTER — Other Ambulatory Visit: Payer: Self-pay | Admitting: Physician Assistant

## 2020-05-31 DIAGNOSIS — E785 Hyperlipidemia, unspecified: Secondary | ICD-10-CM

## 2020-05-31 DIAGNOSIS — I10 Essential (primary) hypertension: Secondary | ICD-10-CM

## 2020-06-07 ENCOUNTER — Other Ambulatory Visit: Payer: Self-pay | Admitting: Physician Assistant

## 2020-06-07 ENCOUNTER — Other Ambulatory Visit: Payer: Medicare Other

## 2020-06-07 DIAGNOSIS — R600 Localized edema: Secondary | ICD-10-CM

## 2020-06-07 MED ORDER — FUROSEMIDE 20 MG PO TABS: 20.0000 mg | ORAL_TABLET | Freq: Two times a day (BID) | ORAL | 1 refills | Status: DC

## 2020-06-09 ENCOUNTER — Encounter (HOSPITAL_COMMUNITY): Payer: Self-pay

## 2020-06-09 ENCOUNTER — Emergency Department (HOSPITAL_COMMUNITY): Payer: Medicare Other

## 2020-06-09 ENCOUNTER — Other Ambulatory Visit: Payer: Self-pay

## 2020-06-09 ENCOUNTER — Inpatient Hospital Stay (HOSPITAL_COMMUNITY)
Admission: EM | Admit: 2020-06-09 | Discharge: 2020-06-15 | DRG: 291 | Disposition: A | Discharge status: Home / Self Care | Payer: Medicare Other | Attending: Cardiology | Admitting: Cardiology

## 2020-06-09 DIAGNOSIS — I248 Other forms of acute ischemic heart disease: Secondary | ICD-10-CM | POA: Diagnosis not present

## 2020-06-09 DIAGNOSIS — I2723 Pulmonary hypertension due to lung diseases and hypoxia: Secondary | ICD-10-CM | POA: Diagnosis present

## 2020-06-09 DIAGNOSIS — M7989 Other specified soft tissue disorders: Secondary | ICD-10-CM | POA: Diagnosis not present

## 2020-06-09 DIAGNOSIS — I130 Hypertensive heart and chronic kidney disease with heart failure and stage 1 through stage 4 chronic kidney disease, or unspecified chronic kidney disease: Principal | ICD-10-CM | POA: Diagnosis present

## 2020-06-09 DIAGNOSIS — E876 Hypokalemia: Secondary | ICD-10-CM | POA: Diagnosis not present

## 2020-06-09 DIAGNOSIS — E785 Hyperlipidemia, unspecified: Secondary | ICD-10-CM | POA: Diagnosis present

## 2020-06-09 DIAGNOSIS — I10 Essential (primary) hypertension: Secondary | ICD-10-CM | POA: Diagnosis not present

## 2020-06-09 DIAGNOSIS — I4891 Unspecified atrial fibrillation: Secondary | ICD-10-CM | POA: Diagnosis not present

## 2020-06-09 DIAGNOSIS — Z20822 Contact with and (suspected) exposure to covid-19: Secondary | ICD-10-CM | POA: Diagnosis not present

## 2020-06-09 DIAGNOSIS — N1832 Chronic kidney disease, stage 3b: Secondary | ICD-10-CM | POA: Diagnosis not present

## 2020-06-09 DIAGNOSIS — Z79890 Hormone replacement therapy (postmenopausal): Secondary | ICD-10-CM

## 2020-06-09 DIAGNOSIS — I499 Cardiac arrhythmia, unspecified: Secondary | ICD-10-CM | POA: Diagnosis not present

## 2020-06-09 DIAGNOSIS — I5033 Acute on chronic diastolic (congestive) heart failure: Secondary | ICD-10-CM | POA: Diagnosis present

## 2020-06-09 DIAGNOSIS — J449 Chronic obstructive pulmonary disease, unspecified: Secondary | ICD-10-CM | POA: Diagnosis not present

## 2020-06-09 DIAGNOSIS — I480 Paroxysmal atrial fibrillation: Secondary | ICD-10-CM | POA: Diagnosis present

## 2020-06-09 DIAGNOSIS — Z7901 Long term (current) use of anticoagulants: Secondary | ICD-10-CM | POA: Diagnosis not present

## 2020-06-09 DIAGNOSIS — N179 Acute kidney failure, unspecified: Secondary | ICD-10-CM | POA: Diagnosis present

## 2020-06-09 DIAGNOSIS — C3490 Malignant neoplasm of unspecified part of unspecified bronchus or lung: Secondary | ICD-10-CM

## 2020-06-09 DIAGNOSIS — Z9981 Dependence on supplemental oxygen: Secondary | ICD-10-CM

## 2020-06-09 DIAGNOSIS — J439 Emphysema, unspecified: Secondary | ICD-10-CM | POA: Diagnosis not present

## 2020-06-09 DIAGNOSIS — Z85118 Personal history of other malignant neoplasm of bronchus and lung: Secondary | ICD-10-CM | POA: Diagnosis not present

## 2020-06-09 DIAGNOSIS — I5083 High output heart failure: Secondary | ICD-10-CM

## 2020-06-09 DIAGNOSIS — E43 Unspecified severe protein-calorie malnutrition: Secondary | ICD-10-CM | POA: Diagnosis present

## 2020-06-09 DIAGNOSIS — J438 Other emphysema: Secondary | ICD-10-CM

## 2020-06-09 DIAGNOSIS — Z953 Presence of xenogenic heart valve: Secondary | ICD-10-CM

## 2020-06-09 DIAGNOSIS — J441 Chronic obstructive pulmonary disease with (acute) exacerbation: Secondary | ICD-10-CM | POA: Diagnosis present

## 2020-06-09 DIAGNOSIS — D696 Thrombocytopenia, unspecified: Secondary | ICD-10-CM | POA: Diagnosis not present

## 2020-06-09 DIAGNOSIS — I071 Rheumatic tricuspid insufficiency: Secondary | ICD-10-CM | POA: Diagnosis not present

## 2020-06-09 DIAGNOSIS — J9621 Acute and chronic respiratory failure with hypoxia: Secondary | ICD-10-CM | POA: Diagnosis present

## 2020-06-09 DIAGNOSIS — Z681 Body mass index (BMI) 19 or less, adult: Secondary | ICD-10-CM

## 2020-06-09 DIAGNOSIS — Z743 Need for continuous supervision: Secondary | ICD-10-CM | POA: Diagnosis not present

## 2020-06-09 DIAGNOSIS — R7303 Prediabetes: Secondary | ICD-10-CM | POA: Diagnosis present

## 2020-06-09 DIAGNOSIS — J432 Centrilobular emphysema: Secondary | ICD-10-CM | POA: Diagnosis not present

## 2020-06-09 DIAGNOSIS — Z8249 Family history of ischemic heart disease and other diseases of the circulatory system: Secondary | ICD-10-CM

## 2020-06-09 DIAGNOSIS — I4892 Unspecified atrial flutter: Secondary | ICD-10-CM | POA: Diagnosis present

## 2020-06-09 DIAGNOSIS — R0602 Shortness of breath: Secondary | ICD-10-CM

## 2020-06-09 DIAGNOSIS — Z952 Presence of prosthetic heart valve: Secondary | ICD-10-CM | POA: Diagnosis not present

## 2020-06-09 DIAGNOSIS — J9620 Acute and chronic respiratory failure, unspecified whether with hypoxia or hypercapnia: Secondary | ICD-10-CM

## 2020-06-09 DIAGNOSIS — Z79899 Other long term (current) drug therapy: Secondary | ICD-10-CM

## 2020-06-09 DIAGNOSIS — E038 Other specified hypothyroidism: Secondary | ICD-10-CM | POA: Diagnosis not present

## 2020-06-09 DIAGNOSIS — Z87891 Personal history of nicotine dependence: Secondary | ICD-10-CM

## 2020-06-09 DIAGNOSIS — I272 Pulmonary hypertension, unspecified: Secondary | ICD-10-CM

## 2020-06-09 DIAGNOSIS — R0902 Hypoxemia: Secondary | ICD-10-CM | POA: Diagnosis not present

## 2020-06-09 DIAGNOSIS — Z825 Family history of asthma and other chronic lower respiratory diseases: Secondary | ICD-10-CM

## 2020-06-09 DIAGNOSIS — E039 Hypothyroidism, unspecified: Secondary | ICD-10-CM | POA: Diagnosis not present

## 2020-06-09 DIAGNOSIS — J9622 Acute and chronic respiratory failure with hypercapnia: Secondary | ICD-10-CM | POA: Diagnosis not present

## 2020-06-09 DIAGNOSIS — R609 Edema, unspecified: Secondary | ICD-10-CM | POA: Diagnosis not present

## 2020-06-09 DIAGNOSIS — Z7952 Long term (current) use of systemic steroids: Secondary | ICD-10-CM

## 2020-06-09 DIAGNOSIS — G2581 Restless legs syndrome: Secondary | ICD-10-CM | POA: Diagnosis present

## 2020-06-09 HISTORY — DX: Heart failure, unspecified: I50.9

## 2020-06-09 LAB — CBC WITH DIFFERENTIAL/PLATELET
Abs Immature Granulocytes: 0.03 10*3/uL (ref 0.00–0.07)
Basophils Absolute: 0 10*3/uL (ref 0.0–0.1)
Basophils Relative: 0 %
Eosinophils Absolute: 0.1 10*3/uL (ref 0.0–0.5)
Eosinophils Relative: 1 %
HCT: 40.2 % (ref 36.0–46.0)
Hemoglobin: 13.2 g/dL (ref 12.0–15.0)
Immature Granulocytes: 0 %
Lymphocytes Relative: 4 %
Lymphs Abs: 0.3 10*3/uL — ABNORMAL LOW (ref 0.7–4.0)
MCH: 33.1 pg (ref 26.0–34.0)
MCHC: 32.8 g/dL (ref 30.0–36.0)
MCV: 100.8 fL — ABNORMAL HIGH (ref 80.0–100.0)
Monocytes Absolute: 0.4 10*3/uL (ref 0.1–1.0)
Monocytes Relative: 5 %
Neutro Abs: 8.6 10*3/uL — ABNORMAL HIGH (ref 1.7–7.7)
Neutrophils Relative %: 90 %
Platelets: 110 10*3/uL — ABNORMAL LOW (ref 150–400)
RBC: 3.99 MIL/uL (ref 3.87–5.11)
RDW: 13.7 % (ref 11.5–15.5)
WBC: 9.4 10*3/uL (ref 4.0–10.5)
nRBC: 0 % (ref 0.0–0.2)

## 2020-06-09 LAB — BRAIN NATRIURETIC PEPTIDE: B Natriuretic Peptide: 367.1 pg/mL — ABNORMAL HIGH (ref 0.0–100.0)

## 2020-06-09 LAB — I-STAT CHEM 8, ED
BUN: 36 mg/dL — ABNORMAL HIGH (ref 8–23)
Calcium, Ion: 0.98 mmol/L — ABNORMAL LOW (ref 1.15–1.40)
Chloride: 98 mmol/L (ref 98–111)
Creatinine, Ser: 1.2 mg/dL — ABNORMAL HIGH (ref 0.44–1.00)
Glucose, Bld: 112 mg/dL — ABNORMAL HIGH (ref 70–99)
HCT: 35 % — ABNORMAL LOW (ref 36.0–46.0)
Hemoglobin: 11.9 g/dL — ABNORMAL LOW (ref 12.0–15.0)
Potassium: 4.2 mmol/L (ref 3.5–5.1)
Sodium: 140 mmol/L (ref 135–145)
TCO2: 32 mmol/L (ref 22–32)

## 2020-06-09 LAB — MAGNESIUM: Magnesium: 1.9 mg/dL (ref 1.7–2.4)

## 2020-06-09 LAB — SARS CORONAVIRUS 2 (TAT 6-24 HRS): SARS Coronavirus 2: NEGATIVE

## 2020-06-09 LAB — TROPONIN I (HIGH SENSITIVITY): Troponin I (High Sensitivity): 28 ng/L — ABNORMAL HIGH (ref <18)

## 2020-06-09 MED ORDER — ACETAMINOPHEN 325 MG PO TABS: 650.0000 mg | ORAL_TABLET | ORAL | Status: DC | PRN

## 2020-06-09 MED ORDER — LEVOTHYROXINE SODIUM 112 MCG PO TABS
112.0000 ug | ORAL_TABLET | Freq: Every day | ORAL | Status: DC
  Administered 2020-06-10 – 2020-06-15 (×6): 112 ug via ORAL
  Filled 2020-06-09 (×6): qty 1

## 2020-06-09 MED ORDER — AMIODARONE HCL IN DEXTROSE 360-4.14 MG/200ML-% IV SOLN
60.0000 mg/h | INTRAVENOUS | Status: DC
  Administered 2020-06-09: 60 mg/h via INTRAVENOUS
  Filled 2020-06-09 (×2): qty 200

## 2020-06-09 MED ORDER — UMECLIDINIUM BROMIDE 62.5 MCG/INH IN AEPB
1.0000 | INHALATION_SPRAY | Freq: Every day | RESPIRATORY_TRACT | Status: DC
  Administered 2020-06-09 – 2020-06-15 (×7): 1 via RESPIRATORY_TRACT
  Filled 2020-06-09: qty 7

## 2020-06-09 MED ORDER — AMIODARONE LOAD VIA INFUSION
150.0000 mg | Freq: Once | INTRAVENOUS | Status: AC
  Administered 2020-06-09: 150 mg via INTRAVENOUS
  Filled 2020-06-09 (×2): qty 83.34

## 2020-06-09 MED ORDER — NITROGLYCERIN 0.4 MG SL SUBL: 0.4000 mg | SUBLINGUAL_TABLET | SUBLINGUAL | Status: DC | PRN

## 2020-06-09 MED ORDER — ASPIRIN EC 81 MG PO TBEC
81.0000 mg | DELAYED_RELEASE_TABLET | Freq: Every day | ORAL | Status: DC
  Administered 2020-06-09 – 2020-06-10 (×2): 81 mg via ORAL
  Filled 2020-06-09 (×2): qty 1

## 2020-06-09 MED ORDER — ARFORMOTEROL TARTRATE 15 MCG/2ML IN NEBU
15.0000 ug | INHALATION_SOLUTION | Freq: Two times a day (BID) | RESPIRATORY_TRACT | Status: DC
  Administered 2020-06-09 – 2020-06-15 (×12): 15 ug via RESPIRATORY_TRACT
  Filled 2020-06-09 (×13): qty 2

## 2020-06-09 MED ORDER — ONDANSETRON HCL 4 MG/2ML IJ SOLN
4.0000 mg | Freq: Four times a day (QID) | INTRAMUSCULAR | Status: DC | PRN
  Administered 2020-06-11: 4 mg via INTRAVENOUS
  Filled 2020-06-09: qty 2

## 2020-06-09 MED ORDER — ALBUTEROL SULFATE HFA 108 (90 BASE) MCG/ACT IN AERS
2.0000 | INHALATION_SPRAY | Freq: Four times a day (QID) | RESPIRATORY_TRACT | Status: DC | PRN
  Administered 2020-06-10 – 2020-06-13 (×2): 2 via RESPIRATORY_TRACT
  Filled 2020-06-09 (×2): qty 6.7

## 2020-06-09 MED ORDER — AMIODARONE HCL IN DEXTROSE 360-4.14 MG/200ML-% IV SOLN
60.0000 mg/h | INTRAVENOUS | Status: DC
  Administered 2020-06-10: 30 mg/h via INTRAVENOUS
  Administered 2020-06-10 – 2020-06-11 (×5): 60 mg/h via INTRAVENOUS
  Filled 2020-06-09 (×6): qty 200

## 2020-06-09 MED ORDER — FUROSEMIDE 10 MG/ML IJ SOLN
40.0000 mg | Freq: Once | INTRAMUSCULAR | Status: AC
  Administered 2020-06-09: 40 mg via INTRAVENOUS
  Filled 2020-06-09: qty 4

## 2020-06-09 MED ORDER — PREDNISONE 10 MG PO TABS
10.0000 mg | ORAL_TABLET | Freq: Every day | ORAL | Status: DC
  Administered 2020-06-10 – 2020-06-13 (×4): 10 mg via ORAL
  Filled 2020-06-09 (×4): qty 1

## 2020-06-09 MED ORDER — SIMVASTATIN 20 MG PO TABS
20.0000 mg | ORAL_TABLET | Freq: Every day | ORAL | Status: DC
  Administered 2020-06-09 – 2020-06-14 (×6): 20 mg via ORAL
  Filled 2020-06-09 (×6): qty 1

## 2020-06-09 MED ORDER — HEPARIN BOLUS VIA INFUSION
2500.0000 [IU] | Freq: Once | INTRAVENOUS | Status: AC
  Administered 2020-06-09: 2500 [IU] via INTRAVENOUS
  Filled 2020-06-09: qty 2500

## 2020-06-09 MED ORDER — HEPARIN (PORCINE) 25000 UT/250ML-% IV SOLN
800.0000 [IU]/h | INTRAVENOUS | Status: DC
  Administered 2020-06-09: 16:00:00 750 [IU]/h via INTRAVENOUS
  Filled 2020-06-09: qty 250

## 2020-06-09 NOTE — ED Notes (Signed)
This RN attempted to call report to [place]. Was told that room is still being cleaned and report cannot be taken.

## 2020-06-09 NOTE — ED Notes (Signed)
This RN attempted to call report to [place]. Told that she is in a COVID room and cannot currently take report. This RN requested to give report to [place] charge nurse. Told that charge nurse is also unavailable. This RN provided work phone extension to be called back for report

## 2020-06-09 NOTE — ED Notes (Signed)
Attempted to give report and was told to call back in 5 mins

## 2020-06-09 NOTE — ED Triage Notes (Signed)
Pt arrived via [facility] from home for SOB, dyspnea w/exertion and a gain of 5 lbs in one week. Pt's son recently dx with COVID and she had contact with him. Pt is A&Ox4. Pt is in A-fib w/RVR on monitor

## 2020-06-09 NOTE — ED Provider Notes (Signed)
[facility] EMERGENCY DEPARTMENT Provider Note   CSN: [id number] Arrival date & time: 06/09/20  1130     History Chief Complaint  Patient presents with  . Shortness of Breath    [name] is a 87 y.o. female.  87 yo F with a chief complaints of shortness of breath. This been going on for the past few days. She thinks it started with worsening lower extremity edema. Denies cough or congestion or fever. Denies chest pain or pressure. She denies palpitations. Denies abdominal pain has been eating and drinking normally. Has been taking her Lasix as prescribed but has not seemed to be working.  The history is provided by the patient.  Shortness of Breath Severity:  Moderate Onset quality:  Sudden Duration:  2 days Timing:  Constant Progression:  Worsening Chronicity:  New Relieved by:  Nothing Worsened by:  Nothing Ineffective treatments:  None tried Associated symptoms: no chest pain, no cough, no fever, no headaches, no vomiting and no wheezing        Past Medical History:  Diagnosis Date  . Allergic rhinitis   . Aortic stenosis   . Carotid artery disease ([place])   . Colon polyps   . Congestive heart failure (CHF) ([place])   . h/o Atrial fibrillation ([place]) 03/22/2011   Brief post op   . Hematuria 11/05/2015  . Hypercalcemia   . Hypertension   . Hypothyroid   . Lung cancer, lower lobe ([place])   . Osteopenia   . 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 Pressure urticaria   . RLS (restless legs syndrome)   . S/P aortic valve replacement and aortoplasty 04/13/2011   33m Medtronic Freestyle porcine aortic root   . S/P aortic valve replacement with stentless valve 03/18/2011    Patient Active Problem List   Diagnosis Date Noted  . Pulmonary nodule 1 cm or greater in diameter 07/24/2019  . Nonintractable headache 07/04/2019  . Suppurative otitis media of right ear 07/04/2019  . Hospital discharge follow-up 07/04/2019  . Hydronephrosis 06/26/2019  . Chest pain  in adult 06/25/2019  . Hypertensive urgency   . History of aortic valve replacement   . Hyperlipidemia LDL goal <70   . New daily persistent headache-  now resolved 02/28/2019  . Stage 3b chronic kidney disease ([facility] 02/28/2019  . Prediabetes 02/28/2019  . History of iron deficiency anemia 02/28/2019  . Urinary frequency 02/28/2019  . Dysthymia 02/28/2019  . Unintentional weight loss 02/09/2019  . Chronic respiratory failure with hypoxia ([place] 02/09/2019  . Insomnia 04/28/2018  . Iron deficiency anemia 04/28/2018  . Estrogen deficiency 04/28/2018  . H. pylori infection w/ GERD sx 10/19/2017  . Essential hypertension 10/19/2017  . Medication monitoring encounter 07/07/2017  . Family history of breast cancer in sister 07/07/2017  . Family history of bladder cancer-in mother 07/07/2017  . Osteopenia after menopause 07/07/2017  . Chest pain 07/07/2017  . History of smoking   ( 43 yrs at 0.5ppd) ;1991- until 2012 ( 8 cig/day )   02/09/2017  . Dyspnea 03/11/2016  . Elevated hemoglobin A1c 11/13/2015  . Hematuria 11/05/2015  . Hypothyroidism; on synthroid 11/05/2015  . Osteopenia 11/05/2015  . h/o Hypercalcemia 11/05/2015  . h/o RLS (restless legs syndrome) 11/05/2015  . h/o Intention tremor  11/05/2015  . History of nephrolithiasis 11/05/2015  . S/P aortic valve replacement and aortoplasty 04/13/2011  . h/o Atrial fibrillation ([place] 03/22/2011  . S/P aortic valve replacement with stentless valve 03/18/2011  . Carotid artery  disease ([place])   . Hypertension   . Chronic obstructive pulmonary disease ([place]) 03/02/2011    Past Surgical History:  Procedure Laterality Date  . AORTIC ROOT REPLACEMENT  03/18/2011   31m Medtronic Freestyle Porcine aortic root with reimplantation of coronary arteries  . BREAST LUMPECTOMY     Left  . CATARACT EXTRACTION    . CHOLECYSTECTOMY    . FOOT SURGERY     Morton's neuroma  . KNEE ARTHROSCOPY     Left     OB History   No obstetric history on  file.     Family History  Problem Relation Age of Onset  . Cancer Mother        bladder  . Lung cancer Father   . Heart disease Maternal Grandmother   . Emphysema Maternal Aunt   . Asthma Maternal Aunt   . Asthma Maternal Uncle   . Breast cancer Sister   . Uterine cancer Sister     Social History   Tobacco Use  . Smoking status: Former Smoker    Packs/day: 0.90    Years: 62.00    Pack years: 55.80    Types: Cigarettes    Quit date: 07/14/2010    Years since quitting: 9.9  . Smokeless tobacco: Never Used  Vaping Use  . Vaping Use: Never used  Substance Use Topics  . Alcohol use: No    Alcohol/week: 0.0 standard drinks  . Drug use: No    Home Medications Prior to Admission medications   Medication Sig Start Date End Date Taking? Authorizing Provider  acetaminophen (TYLENOL) 500 MG tablet Take 500 mg by mouth every 6 (six) hours as needed. Take 3 tablets every 6 hours    [provider]  albuterol (PROAIR HFA) 108 (90 Base) MCG/ACT inhaler Inhale 2 puffs into the lungs every 6 (six) hours as needed for wheezing or shortness of breath. 11/30/19   BCollene Gobble MD  amitriptyline (ELAVIL) 10 MG tablet Take 1 tablet (10 mg total) by mouth at bedtime. 10/04/19   Raulkar, KClide Deutscher MD  amLODipine (NORVASC) 2.5 MG tablet Take 1 tablet by mouth once daily 04/16/20   ALorrene Reid PA-C  aspirin EC 81 MG tablet Take 81 mg by mouth daily.    [provider]  doxycycline (VIBRA-TABS) 100 MG tablet Take 1 tablet (100 mg total) by mouth 2 (two) times daily. 04/24/20   ALorrene Reid PA-C  furosemide (LASIX) 20 MG tablet Take 1 tablet (20 mg total) by mouth 2 (two) times daily. 06/07/20   ALorrene Reid PA-C  isosorbide mononitrate (IMDUR) 30 MG 24 hr tablet Take 0.5 tablets (15 mg total) by mouth daily. Patient not taking: Reported on 03/06/2020 06/27/19   BRadene Gunning NP  levothyroxine (SYNTHROID) 112 MCG tablet Take 1 tablet by mouth once daily 04/29/20    Abonza, Maritza, PA-C  MELATONIN PO Take 1 tablet by mouth at bedtime.     [provider]  Omega-3 Fatty Acids (FISH OIL) 1000 MG CAPS Take 1,000 mg by mouth daily.    [provider]  OXYGEN Inhale 2 L into the lungs continuous.    [provider]  predniSONE (DELTASONE) 10 MG tablet Take 1 tablet by mouth once daily with breakfast 04/30/20   BCollene Gobble MD  simvastatin (ZOCOR) 20 MG tablet Take 1 tablet (20 mg total) by mouth at bedtime. 12/13/19   Abonza, Maritza, PA-C  STIOLTO RESPIMAT 2.5-2.5 MCG/ACT AERS INHALE 2 PUFFS BY MOUTH  ONCE DAILY. PLEASE SEE DR FOR REFILLS. 09/15/19   Collene Gobble, MD    Allergies    Clarithromycin, Tamsulosin, Trazodone and nefazodone, and Penicillins  Review of Systems   Review of Systems  Constitutional: Negative for chills and fever.  HENT: Negative for congestion and rhinorrhea.   Eyes: Negative for redness and visual disturbance.  Respiratory: Positive for shortness of breath. Negative for cough and wheezing.   Cardiovascular: Positive for leg swelling. Negative for chest pain and palpitations.  Gastrointestinal: Negative for nausea and vomiting.  Genitourinary: Negative for dysuria and urgency.  Musculoskeletal: Negative for arthralgias and myalgias.  Skin: Negative for pallor and wound.  Neurological: Negative for dizziness and headaches.    Physical Exam Updated Vital Signs BP (!) 124/98   Pulse (!) 152   Temp 98.3 F (36.8 C) (Oral)   Resp 17   Ht 5' 4.25" (1.632 m)   Wt 52.2 kg   SpO2 98%   BMI 19.59 kg/m   Physical Exam Vitals and nursing note reviewed.  Constitutional:      General: She is not in acute distress.    Appearance: She is well-developed and well-nourished. She is not diaphoretic.  HENT:     Head: Normocephalic and atraumatic.  Eyes:     Extraocular Movements: EOM normal.     Pupils: Pupils are equal, round, and reactive to light.  Neck:     Vascular: JVD (to the jaw) present.   Cardiovascular:     Rate and Rhythm: Normal rate and regular rhythm.     Heart sounds: No murmur heard. No friction rub. No gallop.   Pulmonary:     Effort: Pulmonary effort is normal.     Breath sounds: No wheezing or rales.  Abdominal:     General: There is no distension.     Palpations: Abdomen is soft.     Tenderness: There is no abdominal tenderness.  Musculoskeletal:        General: No tenderness.     Cervical back: Normal range of motion and neck supple.     Right lower leg: Edema present.     Left lower leg: Edema present.     Comments: 3+ lower extremity edema on the right compared to 1+ on the left.  Skin:    General: Skin is warm and dry.  Neurological:     Mental Status: She is alert and oriented to person, place, and time.  Psychiatric:        Mood and Affect: Mood and affect normal.        Behavior: Behavior normal.     ED Results / Procedures / Treatments   Labs (all labs ordered are listed, but only abnormal results are displayed) Labs Reviewed  BRAIN NATRIURETIC PEPTIDE - Abnormal; Notable for the following components:      Result Value   B Natriuretic Peptide 367.1 (*)    All other components within normal limits  CBC WITH DIFFERENTIAL/PLATELET - Abnormal; Notable for the following components:   MCV 100.8 (*)    Platelets 110 (*)    Neutro Abs 8.6 (*)    Lymphs Abs 0.3 (*)    All other components within normal limits  I-STAT CHEM 8, ED - Abnormal; Notable for the following components:   BUN 36 (*)    Creatinine, Ser 1.20 (*)    Glucose, Bld 112 (*)    Calcium, Ion 0.98 (*)    Hemoglobin 11.9 (*)    HCT 35.0 (*)  All other components within normal limits  TROPONIN I (HIGH SENSITIVITY) - Abnormal; Notable for the following components:   Troponin I (High Sensitivity) 28 (*)    All other components within normal limits  SARS CORONAVIRUS 2 (TAT 6-24 HRS)  MAGNESIUM  CBC WITH DIFFERENTIAL/PLATELET  BASIC METABOLIC PANEL  HEPARIN LEVEL  (UNFRACTIONATED)    EKG EKG Interpretation  Date/Time:  _2  yo F with a history of heart failure status post aortic valve replacement comes in with a chief complaint of shortness of breath. Patient found to be tachycardic with a rate into the 150s. Fluid overload clinically on exam. I-STAT with a normal potassium. Renal function appears to be close to baseline. We will give a dose of Lasix here.  Discussed with Cards, recommends amio bolus and infusion.  Will start on heparin as well.   Awaiting rest of the workup.   CRITICAL CARE Performed by: Cecilio Asper   Total critical care time: 35 minutes  Critical care time was exclusive of separately billable procedures and treating other patients.  Critical care was necessary to treat or prevent imminent or life-threatening deterioration.  Critical care was time spent personally by me on the following activities: development of treatment plan with patient and/or surrogate as well as nursing, discussions with consultants, evaluation of patient's response to treatment, examination of patient, obtaining history from patient or surrogate, ordering and performing treatments and interventions, ordering and review of laboratory studies, ordering and review of radiographic  studies, pulse oximetry and re-evaluation of patient's condition.  The patients results and plan were reviewed and discussed.   Any x-rays performed were independently reviewed by myself.   Differential diagnosis were considered with the presenting HPI.  Medications  amiodarone (NEXTERONE) 1.8 mg/mL load via infusion 150 mg (has no administration in time range)    Followed by  amiodarone (NEXTERONE PREMIX) 360-4.14 MG/200ML-% (1.8 mg/mL) IV infusion (has no administration in time range)    Followed by  amiodarone (NEXTERONE PREMIX) 360-4.14 MG/200ML-% (1.8  mg/mL) IV infusion (has no administration in time range)  heparin bolus via infusion 2,500 Units (has no administration in time range)  heparin ADULT infusion 100 units/mL (25000 units/255m) (has no administration in time range)  furosemide (LASIX) injection 40 mg (40 mg Intravenous Given 06/09/20 1311)    Vitals:   06/09/20 1345 06/09/20 1400 06/09/20 1415 06/09/20 1430  BP: 115/82 110/80 125/78 (!) 124/98  Pulse: (!) 149 (!) 152 (!) 150 (!) 152  Resp: _0 Temp:      TempSrc:      SpO2: 100% 99% 98% 98%  Weight:      Height:        Final diagnoses:  Atrial flutter with rapid ventricular response (HCC)  High output congestive heart failure ([place]    Admission/ observation were discussed with the admitting physician, patient and/or family and they are comfortable with the plan.    Final Clinical Impression(s) / ED Diagnoses Final diagnoses:  Atrial flutter with rapid ventricular response ([place]  High output congestive heart failure ([facility]    Rx / DC Orders ED Discharge Orders    None       FDeno Etienne DO 06/09/20 1444

## 2020-06-09 NOTE — Progress Notes (Signed)
ANTICOAGULATION CONSULT NOTE - Initial Consult  Pharmacy Consult for heparin Indication: atrial fibrillation  Allergies  Allergen Reactions  . Clarithromycin Other (See Comments)    Mouth/throat sore  . Tamsulosin Itching  . Trazodone And Nefazodone Other (See Comments)    Dizziness   . Penicillins Itching and Rash    Did it involve swelling of the face/tongue/throat, SOB, or low BP? No Did it involve sudden or severe rash/hives, skin peeling, or any reaction on the inside of your mouth or nose? Yes Did you need to seek medical attention at a hospital or doctor's office? Yes When did it last happen?Young woman  If all above answers are "NO", may proceed with cephalosporin use.     Patient Measurements: Height: 5' 4.25" (163.2 cm) Weight: 52.2 kg (115 lb) IBW/kg (Calculated) : 55.28 Heparin Dosing Weight: TBW  Vital Signs: Temp: 98.3 F (36.8 C) (01/23 1209) Temp Source: Oral (01/23 1209) BP: 106/66 (01/23 1300) Pulse Rate: 150 (01/23 1300)  Labs: Recent Labs    06/09/20 1207 06/09/20 1216  HGB  --  11.9*  HCT  --  35.0*  CREATININE  --  1.20*  TROPONINIHS 28*  --     Estimated Creatinine Clearance: 26.7 mL/min (A) (by C-G formula based on SCr of 1.2 mg/dL (H)).   Medical History: Past Medical History:  Diagnosis Date  . Allergic rhinitis   . Aortic stenosis   . Carotid artery disease ([place])   . Colon polyps   . Congestive heart failure (CHF) ([place])   . h/o Atrial fibrillation ([place]) 03/22/2011   Brief post op   . Hematuria 11/05/2015  . Hypercalcemia   . Hypertension   . Hypothyroid   . Lung cancer, lower lobe ([place])   . Osteopenia   . 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 Pressure urticaria   . RLS (restless legs syndrome)   . S/P aortic valve replacement and aortoplasty 04/13/2011   109m Medtronic Freestyle porcine aortic root   . S/P aortic valve replacement with stentless valve 03/18/2011   Assessment: [id number] presenting with SOB, new onset aflutter  w/RVR  Goal of Therapy:  Heparin level 0.3-0.7 units/ml Monitor platelets by anticoagulation protocol: Yes   Plan:  Heparin 2500 units IV x 1, and gtt at 750 units/hr F/u 8 hour heparin level F[facility]  JBertis Ruddy PharmD Clinical Pharmacist ED Pharmacist Phone # [phone]/23/2022 1:18 PM

## 2020-06-09 NOTE — Progress Notes (Signed)
Patient discussed with ER staff. Presents with SOB and clinical signs of heart failure, evidence of new onset aflutter by EKG with RVR. Soft bp's, I don't think will be able to control her rates with av nodal agents. Have asked ER staff to start amiodarone drip. ER has already ordered IV lasix. F/u covid and other labs prior to deciding if cardiology or medicine admission.   Carlyle Dolly MD

## 2020-06-09 NOTE — Consult Note (Signed)
Cardiology Consultation:   Patient ID: ALUNA WHISTON MRN: [id number]; DOB: 10/21/1929  Admit date: 06/09/2020 Date of Consult: 06/09/2020  Primary Care Provider: [name], [facility] Cardiologist: Sinclair Grooms, MD  [place] Electrophysiologist:  None    Patient Profile:   MIKKA KISSNER is a 87 y.o. female with a hx of bioprosthatic AVR who is being seen today for the evaluation of SOB at the request of Dr Tyrone Nine.  History of Present Illness:   Ms. Roll 87 yo female history of HTN, biosprothstic AVR, carotid disease, COPD, HL, COPD, presents with SOB and LE edema. Reports progressing edema and SOB over the last several days. Denies any palpitations.    WBC 9.4 Hgb 13.2 Plt 110 BNP 367 Mg 1.9 Cr 1.2 hstrop 28--> COVID pending CXR stable emphysmatous changes EKG aflutter with RVR 06/2019 echo LVEF 60-65%, indet DDx, normal LA  Reports recent COVID contact, test is pending here.    Past Medical History:  Diagnosis Date  . Allergic rhinitis   . Aortic stenosis   . Carotid artery disease ([place])   . Colon polyps   . Congestive heart failure (CHF) ([place])   . h/o Atrial fibrillation ([place]) 03/22/2011   Brief post op   . Hematuria 11/05/2015  . Hypercalcemia   . Hypertension   . Hypothyroid   . Lung cancer, lower lobe ([place])   . Osteopenia   . 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 Pressure urticaria   . RLS (restless legs syndrome)   . S/P aortic valve replacement and aortoplasty 04/13/2011   51m Medtronic Freestyle porcine aortic root   . S/P aortic valve replacement with stentless valve 03/18/2011    Past Surgical History:  Procedure Laterality Date  . AORTIC ROOT REPLACEMENT  03/18/2011   266mMedtronic Freestyle Porcine aortic root with reimplantation of coronary arteries  . BREAST LUMPECTOMY     Left  . CATARACT EXTRACTION    . CHOLECYSTECTOMY    . FOOT SURGERY     Morton's neuroma  . KNEE ARTHROSCOPY     Left     Inpatient  Medications: Scheduled Meds:  Continuous Infusions: . amiodarone 60 mg/hr (06/09/20 1540)   Followed by  . amiodarone    . heparin 750 Units/hr (06/09/20 1538)   PRN Meds:   Allergies:    Allergies  Allergen Reactions  . Clarithromycin Other (See Comments)    Mouth/throat sore  . Tamsulosin Itching  . Trazodone And Nefazodone Other (See Comments)    Dizziness   . Penicillins Itching and Rash    Did it involve swelling of the face/tongue/throat, SOB, or low BP? No Did it involve sudden or severe rash/hives, skin peeling, or any reaction on the inside of your mouth or nose? Yes Did you need to seek medical attention at a hospital or doctor's office? Yes When did it last happen?Young woman  If all above answers are "NO", may proceed with cephalosporin use.     Social History:   Social History   Socioeconomic History  . Marital status: Widowed    Spouse name: Not on file  . Number of children: N  . Years of education: Not on file  . Highest education level: Not on file  Occupational History  . Occupation: housewife  Tobacco Use  . Smoking status: Former Smoker    Packs/day: 0.90    Years: 62.00    Pack years: 55.80    Types: Cigarettes    Quit date: 07/14/2010  Years since quitting: 9.9  . Smokeless tobacco: Never Used  Vaping Use  . Vaping Use: Never used  Substance and Sexual Activity  . Alcohol use: No    Alcohol/week: 0.0 standard drinks  . Drug use: No  . Sexual activity: Never  Other Topics Concern  . Not on file  Social History Narrative  . Not on file   Social Determinants of Health   Financial Resource Strain: Not on file  Food Insecurity: Not on file  Transportation Needs: Not on file  Physical Activity: Not on file  Stress: Not on file  Social Connections: Not on file  Intimate Partner Violence: Not on file    Family History:    Family History  Problem Relation Age of Onset  . Cancer Mother        bladder  . Lung cancer  Father   . Heart disease Maternal Grandmother   . Emphysema Maternal Aunt   . Asthma Maternal Aunt   . Asthma Maternal Uncle   . Breast cancer Sister   . Uterine cancer Sister      ROS:  Please see the history of present illness.   All other ROS reviewed and negative.     Physical Exam/Data:   Vitals:   06/09/20 1500 06/09/20 1515 06/09/20 1530 06/09/20 1611  BP: 126/82 120/72 97/72 127/89  Pulse: (!) 151 (!) 152 (!) 149 (!) 142  Resp: (!) 33 19 19 (!) 24  Temp:    98.4 F (36.9 C)  TempSrc:    Oral  SpO2: 98% 99% 98% 96%  Weight:      Height:       No intake or output data in the 24 hours ending 06/09/20 1623 Last 3 Weights 06/09/2020 04/24/2020 04/18/2020  Weight (lbs) 115 lb 114 lb 11.2 oz 115 lb  Weight (kg) 52.164 kg 52.028 kg 52.164 kg     Body mass index is 19.59 kg/m.  General:  Well nourished, well developed, in no acute distress HEENT: normal Lymph: no adenopathy Neck: elevated JVD Endocrine:  No thryomegaly Vascular: No carotid bruits; FA pulses 2+ bilaterally without bruits  Cardiac:  Regular, tachy Lungs:  Decreased breath sounds bilateral bases Abd: soft, nontender, no hepatomegaly  Ext:1+ bilateral LE edema Musculoskeletal:  No deformities, BUE and BLE strength normal and equal Skin: warm and dry  Neuro:  CNs 2-12 intact, no focal abnormalities noted Psych:  Normal affect     Laboratory Data:  High Sensitivity Troponin:   Recent Labs  Lab 06/09/20 1207  TROPONINIHS 28*     Chemistry Recent Labs  Lab 06/09/20 1216  NA 140  K 4.2  CL 98  GLUCOSE 112*  BUN 36*  CREATININE 1.20*    No results for input(s): PROT, ALBUMIN, AST, ALT, ALKPHOS, BILITOT in the last 168 hours. Hematology Recent Labs  Lab 06/09/20 1216 06/09/20 1309  WBC  --  9.4  RBC  --  3.99  HGB 11.9* 13.2  HCT 35.0* 40.2  MCV  --  100.8*  MCH  --  33.1  MCHC  --  32.8  RDW  --  13.7  PLT  --  110*   BNP Recent Labs  Lab 06/09/20 1207  BNP 367.1*     DDimer No results for input(s): DDIMER in the last 168 hours.   Radiology/Studies:  [facility] Chest Port 1 View  Result Date: 06/09/2020 CLINICAL DATA:  Worsening shortness of breath. History COPD and prior lung carcinoma. EXAM: PORTABLE CHEST  1 VIEW COMPARISON:  11/30/2019 FINDINGS: Stable heart size. Stable evidence of emphysematous lung disease with bilateral hyperinflation and scattered parenchymal scarring, most prominently at the right lung base. Prominence of central pulmonary arteries is suggestive of underlying pulmonary hypertension. No pneumothorax, pulmonary edema or significant pleural effusions. IMPRESSION: Stable emphysematous lung disease and probable pulmonary hypertension. Electronically Signed   By: Aletta Edouard M.D.   On: 06/09/2020 13:15     Assessment and Plan:   1. Aflutter with RVR - new diagnosis - with soft bp's she was started on IV amiodarone.  - heparin drip initially during workup, likely transition to oral DOAC tomorrow - NPO tonight in case need TEE/DCCV tomorrow  2. Acute on chronic diastolic HF - likely exacerbated by aflutter with RVR - repeat echo once rates better controlled - got lasix 65m x 1 in ER, repeat 439mIV tonight and reassess diuretic need tomorrow, have not scheduled doses for tomorrow   3.History of prosthetic AVR - repeat echo  4. HTN - soft bp's, hold home bp meds to allow meds for management of her aflutter   Risk Assessment/Risk Scores:  {  For questions or updates, please contact [facility] consult [email or web address] for contact info under    Signed, BrCarlyle DollyMD  06/09/2020 4:23 PM

## 2020-06-10 DIAGNOSIS — Z681 Body mass index (BMI) 19 or less, adult: Secondary | ICD-10-CM | POA: Diagnosis not present

## 2020-06-10 DIAGNOSIS — R0602 Shortness of breath: Secondary | ICD-10-CM | POA: Diagnosis not present

## 2020-06-10 DIAGNOSIS — Z952 Presence of prosthetic heart valve: Secondary | ICD-10-CM | POA: Diagnosis not present

## 2020-06-10 DIAGNOSIS — I130 Hypertensive heart and chronic kidney disease with heart failure and stage 1 through stage 4 chronic kidney disease, or unspecified chronic kidney disease: Secondary | ICD-10-CM | POA: Diagnosis not present

## 2020-06-10 DIAGNOSIS — I10 Essential (primary) hypertension: Secondary | ICD-10-CM

## 2020-06-10 DIAGNOSIS — R609 Edema, unspecified: Secondary | ICD-10-CM | POA: Diagnosis not present

## 2020-06-10 DIAGNOSIS — D696 Thrombocytopenia, unspecified: Secondary | ICD-10-CM | POA: Diagnosis present

## 2020-06-10 DIAGNOSIS — I4892 Unspecified atrial flutter: Secondary | ICD-10-CM | POA: Diagnosis not present

## 2020-06-10 DIAGNOSIS — E876 Hypokalemia: Secondary | ICD-10-CM | POA: Diagnosis present

## 2020-06-10 DIAGNOSIS — E039 Hypothyroidism, unspecified: Secondary | ICD-10-CM | POA: Diagnosis present

## 2020-06-10 DIAGNOSIS — Z20822 Contact with and (suspected) exposure to covid-19: Secondary | ICD-10-CM | POA: Diagnosis not present

## 2020-06-10 DIAGNOSIS — E785 Hyperlipidemia, unspecified: Secondary | ICD-10-CM | POA: Diagnosis present

## 2020-06-10 DIAGNOSIS — I5033 Acute on chronic diastolic (congestive) heart failure: Secondary | ICD-10-CM | POA: Diagnosis not present

## 2020-06-10 DIAGNOSIS — Z7901 Long term (current) use of anticoagulants: Secondary | ICD-10-CM | POA: Diagnosis not present

## 2020-06-10 DIAGNOSIS — Z85118 Personal history of other malignant neoplasm of bronchus and lung: Secondary | ICD-10-CM | POA: Diagnosis not present

## 2020-06-10 DIAGNOSIS — I248 Other forms of acute ischemic heart disease: Secondary | ICD-10-CM | POA: Diagnosis not present

## 2020-06-10 DIAGNOSIS — N1832 Chronic kidney disease, stage 3b: Secondary | ICD-10-CM | POA: Diagnosis not present

## 2020-06-10 DIAGNOSIS — M7989 Other specified soft tissue disorders: Secondary | ICD-10-CM | POA: Diagnosis not present

## 2020-06-10 DIAGNOSIS — I480 Paroxysmal atrial fibrillation: Secondary | ICD-10-CM | POA: Diagnosis present

## 2020-06-10 DIAGNOSIS — J90 Pleural effusion, not elsewhere classified: Secondary | ICD-10-CM | POA: Diagnosis not present

## 2020-06-10 DIAGNOSIS — I4891 Unspecified atrial fibrillation: Secondary | ICD-10-CM | POA: Diagnosis not present

## 2020-06-10 DIAGNOSIS — N179 Acute kidney failure, unspecified: Secondary | ICD-10-CM | POA: Diagnosis not present

## 2020-06-10 DIAGNOSIS — Z79899 Other long term (current) drug therapy: Secondary | ICD-10-CM | POA: Diagnosis not present

## 2020-06-10 DIAGNOSIS — J432 Centrilobular emphysema: Secondary | ICD-10-CM | POA: Diagnosis not present

## 2020-06-10 DIAGNOSIS — J9621 Acute and chronic respiratory failure with hypoxia: Secondary | ICD-10-CM | POA: Diagnosis not present

## 2020-06-10 DIAGNOSIS — I5083 High output heart failure: Secondary | ICD-10-CM | POA: Insufficient documentation

## 2020-06-10 DIAGNOSIS — I509 Heart failure, unspecified: Secondary | ICD-10-CM | POA: Diagnosis not present

## 2020-06-10 DIAGNOSIS — Z79890 Hormone replacement therapy (postmenopausal): Secondary | ICD-10-CM | POA: Diagnosis not present

## 2020-06-10 DIAGNOSIS — E038 Other specified hypothyroidism: Secondary | ICD-10-CM | POA: Diagnosis not present

## 2020-06-10 DIAGNOSIS — J9622 Acute and chronic respiratory failure with hypercapnia: Secondary | ICD-10-CM | POA: Diagnosis not present

## 2020-06-10 DIAGNOSIS — J439 Emphysema, unspecified: Secondary | ICD-10-CM | POA: Diagnosis not present

## 2020-06-10 DIAGNOSIS — I071 Rheumatic tricuspid insufficiency: Secondary | ICD-10-CM | POA: Diagnosis present

## 2020-06-10 DIAGNOSIS — Z953 Presence of xenogenic heart valve: Secondary | ICD-10-CM | POA: Diagnosis not present

## 2020-06-10 DIAGNOSIS — I2723 Pulmonary hypertension due to lung diseases and hypoxia: Secondary | ICD-10-CM | POA: Diagnosis present

## 2020-06-10 DIAGNOSIS — E43 Unspecified severe protein-calorie malnutrition: Secondary | ICD-10-CM | POA: Diagnosis not present

## 2020-06-10 LAB — BASIC METABOLIC PANEL
Anion gap: 12 (ref 5–15)
Anion gap: 11 (ref 5–15)
BUN: 25 mg/dL — ABNORMAL HIGH (ref 8–23)
BUN: 32 mg/dL — ABNORMAL HIGH (ref 8–23)
CO2: 36 mmol/L — ABNORMAL HIGH (ref 22–32)
CO2: 33 mmol/L — ABNORMAL HIGH (ref 22–32)
Calcium: 9.7 mg/dL (ref 8.9–10.3)
Calcium: 9.5 mg/dL (ref 8.9–10.3)
Chloride: 93 mmol/L — ABNORMAL LOW (ref 98–111)
Chloride: 92 mmol/L — ABNORMAL LOW (ref 98–111)
Creatinine, Ser: 1.8 mg/dL — ABNORMAL HIGH (ref 0.44–1.00)
Creatinine, Ser: 1.76 mg/dL — ABNORMAL HIGH (ref 0.44–1.00)
GFR, Estimated: 27 mL/min — ABNORMAL LOW (ref >60)
GFR, Estimated: 27 mL/min — ABNORMAL LOW (ref >60)
Glucose, Bld: 102 mg/dL — ABNORMAL HIGH (ref 70–99)
Glucose, Bld: 183 mg/dL — ABNORMAL HIGH (ref 70–99)
Potassium: 3 mmol/L — ABNORMAL LOW (ref 3.5–5.1)
Potassium: 4.5 mmol/L (ref 3.5–5.1)
Sodium: 141 mmol/L (ref 135–145)
Sodium: 136 mmol/L (ref 135–145)

## 2020-06-10 LAB — CBC
HCT: 42.2 % (ref 36.0–46.0)
Hemoglobin: 13.4 g/dL (ref 12.0–15.0)
MCH: 32.2 pg (ref 26.0–34.0)
MCHC: 31.8 g/dL (ref 30.0–36.0)
MCV: 101.4 fL — ABNORMAL HIGH (ref 80.0–100.0)
Platelets: 111 10*3/uL — ABNORMAL LOW (ref 150–400)
RBC: 4.16 MIL/uL (ref 3.87–5.11)
RDW: 13.7 % (ref 11.5–15.5)
WBC: 6.7 10*3/uL (ref 4.0–10.5)
nRBC: 0 % (ref 0.0–0.2)

## 2020-06-10 LAB — HEPARIN LEVEL (UNFRACTIONATED)
Heparin Unfractionated: 0.29 IU/mL — ABNORMAL LOW (ref 0.30–0.70)
Heparin Unfractionated: 1.96 IU/mL — ABNORMAL HIGH (ref 0.30–0.70)

## 2020-06-10 LAB — MAGNESIUM: Magnesium: 2.2 mg/dL (ref 1.7–2.4)

## 2020-06-10 MED ORDER — APIXABAN 2.5 MG PO TABS
2.5000 mg | ORAL_TABLET | Freq: Two times a day (BID) | ORAL | Status: DC
  Administered 2020-06-10 – 2020-06-15 (×11): 2.5 mg via ORAL
  Filled 2020-06-10 (×11): qty 1

## 2020-06-10 MED ORDER — AMIODARONE IV BOLUS ONLY 150 MG/100ML
150.0000 mg | Freq: Once | INTRAVENOUS | Status: AC
  Administered 2020-06-10: 150 mg via INTRAVENOUS
  Filled 2020-06-10: qty 100

## 2020-06-10 MED ORDER — MAGNESIUM OXIDE 400 (241.3 MG) MG PO TABS
400.0000 mg | ORAL_TABLET | Freq: Once | ORAL | Status: AC
  Administered 2020-06-10: 400 mg via ORAL
  Filled 2020-06-10: qty 1

## 2020-06-10 MED ORDER — METOPROLOL TARTRATE 25 MG PO TABS
25.0000 mg | ORAL_TABLET | Freq: Two times a day (BID) | ORAL | Status: DC
  Administered 2020-06-10 – 2020-06-13 (×6): 25 mg via ORAL
  Filled 2020-06-10 (×7): qty 1

## 2020-06-10 MED ORDER — POTASSIUM CHLORIDE CRYS ER 20 MEQ PO TBCR
40.0000 meq | EXTENDED_RELEASE_TABLET | Freq: Once | ORAL | Status: AC
  Administered 2020-06-10: 40 meq via ORAL
  Filled 2020-06-10: qty 2

## 2020-06-10 NOTE — Progress Notes (Signed)
Progress Note  Patient Name: [name] Date of Encounter: 06/10/2020  [facility] Cardiologist: Belva Crome III, MD   Subjective   Had more afib last night with RVR and Amio gtt increased to 20m/hr.  Denies any chest pain but complains of SOB from her COPD  Inpatient Medications    Scheduled Meds: . arformoterol  15 mcg Nebulization BID   And  . umeclidinium bromide  1 puff Inhalation Daily  . aspirin EC  81 mg Oral Daily  . levothyroxine  112 mcg Oral Daily  . predniSONE  10 mg Oral Daily  . simvastatin  20 mg Oral QHS   Continuous Infusions: . amiodarone 60 mg/hr (06/10/20 0829)  . heparin 800 Units/hr (06/10/20 0552)   PRN Meds: acetaminophen, albuterol, nitroGLYCERIN, ondansetron (ZOFRAN) IV   Vital Signs    Vitals:   06/10/20 0420 06/10/20 0520 06/10/20 0620 06/10/20 0846  BP: (!) 133/91 (!) 149/102 (!) 153/91   Pulse: (!) 135 (!) 134 (!) 134   Resp: _0 Temp:  97.9 F (36.6 C)    TempSrc:  Oral    SpO2: 96% 95% 98% 97%  Weight:      Height:        Intake/Output Summary (Last 24 hours) at 06/10/2020 0850 Last data filed at 06/10/2020 0552 Gross per 24 hour  Intake 1136.72 ml  Output 1000 ml  Net 136.72 ml   Last 3 Weights 06/09/2020 06/09/2020 04/24/2020  Weight (lbs) 115 lb 8.3 oz 115 lb 114 lb 11.2 oz  Weight (kg) 52.4 kg 52.164 kg 52.028 kg      Telemetry    Atrial flutter with RVR - Personally Reviewed  ECG    Atrial flutter with RVR and 2:1 block - Personally Reviewed  Physical Exam   GEN: No acute distress.   Neck: No JVD Cardiac: tachcyardic, no murmurs, rubs, or gallops.  Respiratory: decreased BS throughout GI: Soft, nontender, non-distended  MS: No edema; No deformity. Neuro:  Nonfocal  Psych: Normal affect   Labs    High Sensitivity Troponin:   Recent Labs  Lab 06/09/20 1207  TROPONINIHS 28*      Chemistry Recent Labs  Lab 06/09/20 1216 06/10/20 0049  NA 140 141  K 4.2 3.0*  CL 98 93*  CO2  --   36*  GLUCOSE 112* 102*  BUN 36* 25*  CREATININE 1.20* 1.80*  CALCIUM  --  9.7  GFRNONAA  --  27*  ANIONGAP  --  12     Hematology Recent Labs  Lab 06/09/20 1216 06/09/20 1309 06/10/20 0049  WBC  --  9.4 6.7  RBC  --  3.99 4.16  HGB 11.9* 13.2 13.4  HCT 35.0* 40.2 42.2  MCV  --  100.8* 101.4*  MCH  --  33.1 32.2  MCHC  --  32.8 31.8  RDW  --  13.7 13.7  PLT  --  110* 111*    BNP Recent Labs  Lab 06/09/20 1207  BNP 367.1*     DDimer No results for input(s): DDIMER in the last 168 hours.  Radiology    DG Chest Port 1 View  Result Date: 06/09/2020 CLINICAL DATA:  Worsening shortness of breath. History COPD and prior lung carcinoma. EXAM: PORTABLE CHEST 1 VIEW COMPARISON:  11/30/2019 FINDINGS: Stable heart size. Stable evidence of emphysematous lung disease with bilateral hyperinflation and scattered parenchymal scarring, most prominently at the right lung base. Prominence of central pulmonary arteries  is suggestive of underlying pulmonary hypertension. No pneumothorax, pulmonary edema or significant pleural effusions. IMPRESSION: Stable emphysematous lung disease and probable pulmonary hypertension. Electronically Signed   By: Aletta Edouard M.D.   On: 06/09/2020 13:15    Cardiac Studies   2D echo  IMPRESSIONS   1. Left ventricular ejection fraction, by visual estimation, is 60 to  65%. The left ventricle has normal function. There is moderately increased  left ventricular hypertrophy.  2. Left ventricular diastolic parameters are indeterminate.  3. Global right ventricle has normal systolic function.The right  ventricular size is normal.  4. Left atrial size was normal.  5. Right atrial size was mildly dilated.  6. Moderate thickening of the mitral valve leaflet(s).  7. The mitral valve is normal in structure. Trivial mitral valve  regurgitation.  8. The tricuspid valve is normal in structure. Tricuspid valve  regurgitation is trivial.  9.  Bioprosthetic aortic valve with normal function. Aortic valve  regurgitation is not visualized. Aortic valve mean gradient measures 3  mmHg  10. The pulmonic valve was not well visualized. Pulmonic valve  regurgitation is not visualized.  11. The inferior vena cava is normal in size with greater than 50%  respiratory variability, suggesting right atrial pressure of 3 mmHg.  12. The tricuspid regurgitant velocity is 2.59 m/s, and with an assumed  right atrial pressure of 3 mmHg, the estimated right ventricular systolic  pressure is normal at 29.8 mmHg.   Patient Profile     87 y.o. female with a hx of bioprosthatic AVR who is being seen today for the evaluation of SOB at the request of Dr Tyrone Nine.  Assessment & Plan    1. Aflutter with RVR - new diagnosis - with soft bp's she was started on IV amiodarone last night.  - heparin drip initially during workup >> will transition to Eliquis 2.18m BID (age>80 and wt < 60kg) - she needs TEE/DCCV since her HR is inadequately controlled on IV Amio but no room on scheduled today -NPO after MN tonight and plan TEE/DCCV tomorrow after getting 3 doses of eliquis -continue IV Amio gtt -start Lopressor 21mBID for HR and BP control -TSH normal in Dec 2021 -keep K+>4 -After careful review of history and examination, the risks and benefits of transesophageal echocardiogram have been explained including risks of esophageal damage, perforation (1:10,000 risk), bleeding, pharyngeal hematoma as well as other potential complications associated with conscious sedation including aspiration, arrhythmia, respiratory failure and death. Alternatives to treatment were discussed, questions were answered. Patient is willing to proceed.   2. Acute on chronic diastolic HF - likely exacerbated by aflutter with RVR - got lasix 4037m 1 in ER and then again last night she got 64m73m - she put out 1L yesterday and is net +136cc - SCr bumped from 1.2 to 1.8 this am with  diuretics - hold further Lasix for now - replete K+ (3 this am) - Mag 2.2 this am  3. History of prosthetic AVR - repeat echo pending>>will get TEE tomorrow  4. HTN - BP meds initially held for soft BP but now 149/102mm44m she is on amlodipine 2.5mg d51my and Imdur 15mg d43m at home - will start Lopressor 25mg BI14mich will also help with HR control  I have spent a total of 35 minutes with patient reviewing 2D echo , telemetry, EKGs, labs and examining patient as well as establishing an assessment and plan that was discussed with the patient.  > 50%  of time was spent in direct patient care.    For questions or updates, please contact [place] Please consult [email or web address] for contact info under        Signed, Fransico Him, MD  06/10/2020, 8:50 AM

## 2020-06-10 NOTE — Progress Notes (Signed)
Admitted with new-onset atrial flutter and heart failure. On amiodarone with rate now at to 30 mg/hr. Diuresed with IV Lasix x 2 doses.  Rates overnight remain > 130, though not particularly symptomatic. Normotensive to slightly hypertensive.  Labs also show hypokalemia and AKI, likely related to diuretics, though no documented I&Os.  Will bolus additional amiodarone 159m and increase rate to 60. She will likely need DCCV in the morning. Will tolerate some tachycardia for now. Also gave oral KCl and Mag.

## 2020-06-10 NOTE — Progress Notes (Signed)
06/10/20 0027  Assess: MEWS Score  Pulse Rate (!) 136  ECG Heart Rate (!) 136  Resp 17  SpO2 94 %  Assess: MEWS Score  MEWS Temp 0  MEWS Systolic 0  MEWS Pulse 3  MEWS RR 0  MEWS LOC 0  MEWS Score 3  MEWS Score Color Yellow  Assess: if the MEWS score is Yellow or Red  Were vital signs taken at a resting state? Yes  Focused Assessment No change from prior assessment  Early Detection of Sepsis Score *See Row Information* High  MEWS guidelines implemented *See Row Information* Yes  Treat  MEWS Interventions Other (Comment) (Paged MD)  Pain Scale 0-10  Pain Score 0  Take Vital Signs  Increase Vital Sign Frequency  Red: Q 1hr X 4 then Q 4hr X 4, if remains red, continue Q 4hrs  Escalate  MEWS: Escalate Red: discuss with charge nurse/RN and provider, consider discussing with RRT  Notify: Charge Nurse/RN  Name of Charge Nurse/RN Notified Angela, RN  Date Charge Nurse/RN Notified 06/10/20  Time Charge Nurse/RN Notified 0040  Notify: Provider  Provider Name/Title Kalman Shan, MD  Date Provider Notified 06/10/20  Time Provider Notified 0030  Notification Type Page  Notification Reason Other (Comment) (HR remaining in 130's on Amio gtt)  Response Other (Comment) (Awaiting response)  Date of Provider Response 06/10/20  Time of Provider Response 0141  Document  Patient Outcome Not stable and remains on department  Progress note created (see row info) Yes

## 2020-06-10 NOTE — Discharge Instructions (Signed)

## 2020-06-10 NOTE — Progress Notes (Signed)
ANTICOAGULATION CONSULT NOTE - Follow Up Consult  Pharmacy Consult for heparin>>apixaban Indication: Aflutter  Labs: Recent Labs    06/09/20 1207 06/09/20 1216 06/09/20 1216 06/09/20 1309 06/10/20 0049  HGB  --  11.9*   < > 13.2 13.4  HCT  --  35.0*  --  40.2 42.2  PLT  --   --   --  110* 111*  HEPARINUNFRC  --   --   --   --  0.29*  CREATININE  --  1.20*  --   --  1.80*  TROPONINIHS 28*  --   --   --   --    < > = values in this interval not displayed.    Assessment: 87yo female slightly subtherapeutic on heparin with initial dosing for Aflutter; no gtt issues or signs of bleeding per RN.  New orders to transition patient over to apixaban this morning. Patient meets 3/3 requirements for dose adjustment. CBC was stable overnight.   Goal of Therapy:  Heparin level 0.3-0.7 units/ml   Plan:  Transition to apixaban 2.5 bid this morning Consult to [facility] to determine copay cost prior to discharge Will provide education prior to discharge  Erin Hearing PharmD., BCPS Clinical Pharmacist 06/10/2020 9:30 AM

## 2020-06-10 NOTE — Progress Notes (Signed)
MD paged @ 0030 about pt's HR in 130's on Amio gtt, this RN did not receive a response. Paged again at 0118 with no response. Secure chatted @ 0139 with response. See Orders.

## 2020-06-10 NOTE — Progress Notes (Signed)
ANTICOAGULATION CONSULT NOTE - Follow Up Consult  Pharmacy Consult for heparin Indication: Aflutter  Labs: Recent Labs    06/09/20 1207 06/09/20 1216 06/09/20 1216 06/09/20 1309 06/10/20 0049  HGB  --  11.9*   < > 13.2 13.4  HCT  --  35.0*  --  40.2 42.2  PLT  --   --   --  110* 111*  HEPARINUNFRC  --   --   --   --  0.29*  CREATININE  --  1.20*  --   --  1.82*  TROPONINIHS 28*  --   --   --   --    < > = values in this interval not displayed.    Assessment: 87yo female slightly subtherapeutic on heparin with initial dosing for Aflutter; no gtt issues or signs of bleeding per RN.  Goal of Therapy:  Heparin level 0.3-0.7 units/ml   Plan:  Will increase heparin gtt by 1 unit/kg/hr to 800 units/hr and check level in 8 hours.    Wynona Neat, PharmD, BCPS  06/10/2020,1:53 AM

## 2020-06-11 ENCOUNTER — Encounter (HOSPITAL_COMMUNITY)
Admission: EM | Disposition: A | Discharge status: Home / Self Care | Payer: Self-pay | Source: Home / Self Care | Attending: Cardiology

## 2020-06-11 ENCOUNTER — Encounter (HOSPITAL_COMMUNITY): Payer: Self-pay | Admitting: Anesthesiology

## 2020-06-11 ENCOUNTER — Other Ambulatory Visit (HOSPITAL_COMMUNITY): Payer: Medicare Other

## 2020-06-11 DIAGNOSIS — N179 Acute kidney failure, unspecified: Secondary | ICD-10-CM | POA: Diagnosis not present

## 2020-06-11 DIAGNOSIS — I5083 High output heart failure: Secondary | ICD-10-CM | POA: Diagnosis not present

## 2020-06-11 DIAGNOSIS — I4891 Unspecified atrial fibrillation: Secondary | ICD-10-CM | POA: Diagnosis not present

## 2020-06-11 DIAGNOSIS — I10 Essential (primary) hypertension: Secondary | ICD-10-CM | POA: Diagnosis not present

## 2020-06-11 LAB — CBC
HCT: 41.9 % (ref 36.0–46.0)
Hemoglobin: 13.2 g/dL (ref 12.0–15.0)
MCH: 32.3 pg (ref 26.0–34.0)
MCHC: 31.5 g/dL (ref 30.0–36.0)
MCV: 102.4 fL — ABNORMAL HIGH (ref 80.0–100.0)
Platelets: 110 10*3/uL — ABNORMAL LOW (ref 150–400)
RBC: 4.09 MIL/uL (ref 3.87–5.11)
RDW: 13.8 % (ref 11.5–15.5)
WBC: 8.8 10*3/uL (ref 4.0–10.5)
nRBC: 0 % (ref 0.0–0.2)

## 2020-06-11 LAB — BASIC METABOLIC PANEL
Anion gap: 14 (ref 5–15)
BUN: 34 mg/dL — ABNORMAL HIGH (ref 8–23)
CO2: 33 mmol/L — ABNORMAL HIGH (ref 22–32)
Calcium: 9.8 mg/dL (ref 8.9–10.3)
Chloride: 86 mmol/L — ABNORMAL LOW (ref 98–111)
Creatinine, Ser: 1.95 mg/dL — ABNORMAL HIGH (ref 0.44–1.00)
GFR, Estimated: 24 mL/min — ABNORMAL LOW (ref >60)
Glucose, Bld: 123 mg/dL — ABNORMAL HIGH (ref 70–99)
Potassium: 4 mmol/L (ref 3.5–5.1)
Sodium: 133 mmol/L — ABNORMAL LOW (ref 135–145)

## 2020-06-11 LAB — MAGNESIUM: Magnesium: 2.4 mg/dL (ref 1.7–2.4)

## 2020-06-11 SURGERY — ECHOCARDIOGRAM, TRANSESOPHAGEAL
Anesthesia: Monitor Anesthesia Care

## 2020-06-11 SURGERY — Surgical Case
Anesthesia: *Unknown

## 2020-06-11 MED ORDER — AMIODARONE HCL 200 MG PO TABS
200.0000 mg | ORAL_TABLET | Freq: Two times a day (BID) | ORAL | Status: DC
  Administered 2020-06-11 – 2020-06-13 (×5): 200 mg via ORAL
  Filled 2020-06-11 (×5): qty 1

## 2020-06-11 MED ORDER — SODIUM CHLORIDE 0.9 % IV SOLN: INTRAVENOUS | Status: DC

## 2020-06-11 NOTE — Progress Notes (Signed)
Progress Note  Patient Name: [name] Date of Encounter: 06/11/2020  [facility] Cardiologist: Belva Crome III, MD   Subjective   Denies any chest pain or SOB. HR better controlled on IV Amio today  Inpatient Medications    Scheduled Meds: . apixaban  2.5 mg Oral BID  . arformoterol  15 mcg Nebulization BID   And  . umeclidinium bromide  1 puff Inhalation Daily  . levothyroxine  112 mcg Oral Daily  . metoprolol tartrate  25 mg Oral BID  . predniSONE  10 mg Oral Daily  . simvastatin  20 mg Oral QHS   Continuous Infusions: . sodium chloride    . amiodarone 60 mg/hr (06/11/20 0933)   PRN Meds: acetaminophen, albuterol, nitroGLYCERIN, ondansetron (ZOFRAN) IV   Vital Signs    Vitals:   06/11/20 0407 06/11/20 0507 06/11/20 0801 06/11/20 0840  BP: 112/68 116/72  119/70  Pulse: 91 93  99  Resp: _0 Temp: 98 F (36.7 C)   98.5 F (36.9 C)  TempSrc: Oral   Oral  SpO2: 99% 100% 100% 97%  Weight:      Height:        Intake/Output Summary (Last 24 hours) at 06/11/2020 0952 Last data filed at 06/11/2020 0500 Gross per 24 hour  Intake 986.57 ml  Output 200 ml  Net 786.57 ml   Last 3 Weights 06/09/2020 06/09/2020 04/24/2020  Weight (lbs) 115 lb 8.3 oz 115 lb 114 lb 11.2 oz  Weight (kg) 52.4 kg 52.164 kg 52.028 kg      Telemetry    Atrial flutter/fib with FR 80-110's - Personally Reviewed  ECG    No new EKG to review - Personally Reviewed  Physical Exam   GEN: Well nourished, well developed in no acute distress HEENT: Normal NECK: No JVD; No carotid bruits LYMPHATICS: No lymphadenopathy CARDIAC:irregularly irregular, no murmurs, rubs, gallops RESPIRATORY: decreased BS throughout ABDOMEN: Soft, non-tender, non-distended MUSCULOSKELETAL:  No edema; No deformity  SKIN: Warm and dry NEUROLOGIC:  Alert and oriented x 3 PSYCHIATRIC:  Normal affect    Labs    High Sensitivity Troponin:   Recent Labs  Lab 06/09/20 1207  TROPONINIHS 28*       Chemistry Recent Labs  Lab 06/10/20 0049 06/10/20 1132 06/11/20 0311  NA 141 136 133*  K 3.0* 4.5 4.0  CL 93* 92* 86*  CO2 36* 33* 33*  GLUCOSE 102* 183* 123*  BUN 25* 32* 34*  CREATININE 1.80* 1.76* 1.95*  CALCIUM 9.7 9.5 9.8  GFRNONAA 27* 27* 24*  ANIONGAP _1 Hematology Recent Labs  Lab 06/09/20 1309 06/10/20 0049 06/11/20 0311  WBC 9.4 6.7 8.8  RBC 3.99 4.16 4.09  HGB 13.2 13.4 13.2  HCT 40.2 42.2 41.9  MCV 100.8* 101.4* 102.4*  MCH 33.1 32.2 32.3  MCHC 32.8 31.8 31.5  RDW 13.7 13.7 13.8  PLT 110* 111* 110*    BNP Recent Labs  Lab 06/09/20 1207  BNP 367.1*     DDimer No results for input(s): DDIMER in the last 168 hours.  Radiology    DG Chest Port 1 View  Result Date: 06/09/2020 CLINICAL DATA:  Worsening shortness of breath. History COPD and prior lung carcinoma. EXAM: PORTABLE CHEST 1 VIEW COMPARISON:  11/30/2019 FINDINGS: Stable heart size. Stable evidence of emphysematous lung disease with bilateral hyperinflation and scattered parenchymal scarring, most prominently at the right lung base. Prominence of central pulmonary arteries  is suggestive of underlying pulmonary hypertension. No pneumothorax, pulmonary edema or significant pleural effusions. IMPRESSION: Stable emphysematous lung disease and probable pulmonary hypertension. Electronically Signed   By: Aletta Edouard M.D.   On: 06/09/2020 13:15    Cardiac Studies   2D echo  06/25/2019 IMPRESSIONS   1. Left ventricular ejection fraction, by visual estimation, is 60 to  65%. The left ventricle has normal function. There is moderately increased  left ventricular hypertrophy.  2. Left ventricular diastolic parameters are indeterminate.  3. Global right ventricle has normal systolic function.The right  ventricular size is normal.  4. Left atrial size was normal.  5. Right atrial size was mildly dilated.  6. Moderate thickening of the mitral valve leaflet(s).  7. The mitral  valve is normal in structure. Trivial mitral valve  regurgitation.  8. The tricuspid valve is normal in structure. Tricuspid valve  regurgitation is trivial.  9. Bioprosthetic aortic valve with normal function. Aortic valve  regurgitation is not visualized. Aortic valve mean gradient measures 3  mmHg  10. The pulmonic valve was not well visualized. Pulmonic valve  regurgitation is not visualized.  11. The inferior vena cava is normal in size with greater than 50%  respiratory variability, suggesting right atrial pressure of 3 mmHg.  12. The tricuspid regurgitant velocity is 2.59 m/s, and with an assumed  right atrial pressure of 3 mmHg, the estimated right ventricular systolic  pressure is normal at 29.8 mmHg.   Patient Profile     87 y.o. female with a hx of bioprosthatic AVR who is being seen today for the evaluation of SOB at the request of Dr Tyrone Nine.  Assessment & Plan    1. Aflutter with RVR - new diagnosis - with soft bp's she was started on IV amiodarone last night.  - heparin drip initially during workup >> will transition to Eliquis 2.33m BID (age>80 and wt < 60kg) -Plan for TEE/DCCV today after 3rd dose of Eliquis -continue IV Amio gtt and transition to PO 2050mBID post DCCV -continue Lopressor 2532mID  -TSH normal in Dec 2021 -keep K+>4  2. Acute on chronic diastolic HF - likely exacerbated by aflutter with RVR - got lasix 48m59m1 in ER and then again last night she got 48mg49m- she put out 600cc  yesterday and is net +523cc - SCr bumped from 1.2>1.8>1.76>1.95  with diuretics and stopped yesterday - hold further Lasix for now>>suspect rapid afib with reduced renal perfusion as well as diuretics has resulted in AKI  3. History of prosthetic AVR - repeat echo pending>>will get TEE today  4. HTN - BP meds initially held for soft BP - home doses of amlodipine 2.5mg d61my and Imdur 15mg d31m held to allow room for titration of BB for HR control - BP elevated  yesterday and started on Lopressor 25mg BI37mr HR and Bp control  5.  AKI -SCr baseline 1.2 and was at baseline on admit -SCr bumped (1.2>1.8>1.76>1.95) after diuresis -suspect a combination of cardiorenal from afib with RVR and diuresis -continue to hold diuretics  -should improve once NSR has been reestablished  I have spent a total of 35 minutes with patient reviewing 2D echo , telemetry, EKGs, labs and examining patient as well as establishing an assessment and plan that was discussed with the patient.  > 50% of time was spent in direct patient care.    For questions or updates, please contact [facility] [email or web address] for contact info  under        Signed, Fransico Him, MD  06/11/2020, 9:52 AM

## 2020-06-11 NOTE — TOC Benefit Eligibility Note (Signed)
Transition of Care [facility]) Benefit Eligibility Note    Patient Details  Name: [name] MRN: [id number] Date of Birth: [birth year]   Medication/Dose: Arne Cleveland 2.5 MG BID  Covered?: Yes  Tier: 3 Drug  Prescription Coverage Preferred Pharmacy: [facility]  and [facility]  Spoke with Person/Company/Phone Number:: [facility] [employer].  @ [employer]  # [phone]  Co-Pay: $47.00  Prior Approval: No  Deductible: Met (OUT-OF-POCKET :UNMET)  Additional Notes: APIXABAN : NON-FORMULARY    Memory Argue Phone Number: 06/11/2020, 2:11 PM

## 2020-06-11 NOTE — Progress Notes (Addendum)
   Pt  Spontaneously converted to SR. D/C TEE/DCCV Resume diet. Per Dr Theodosia Blender note, change amio to 200 mg bid HR currently 50s, no sx Call for HR < 60 when giving metoprolol, but will not immediately reduce dose. Check 2D echo  Rosaria Ferries, PA-C 06/11/2020 1:32 PM

## 2020-06-11 NOTE — Progress Notes (Signed)
Patient spontaneously converted to sinus bradycardia, rate 40s-50s. Obtained EKG and notified endoscopy. TEE/DCCV cancelled. Spoke with Rosaria Ferries PA via phone; received new orders via Epic.

## 2020-06-12 ENCOUNTER — Inpatient Hospital Stay (HOSPITAL_COMMUNITY): Payer: Medicare Other

## 2020-06-12 DIAGNOSIS — I4891 Unspecified atrial fibrillation: Secondary | ICD-10-CM

## 2020-06-12 DIAGNOSIS — I10 Essential (primary) hypertension: Secondary | ICD-10-CM | POA: Diagnosis not present

## 2020-06-12 DIAGNOSIS — N179 Acute kidney failure, unspecified: Secondary | ICD-10-CM | POA: Diagnosis not present

## 2020-06-12 DIAGNOSIS — I5083 High output heart failure: Secondary | ICD-10-CM | POA: Diagnosis not present

## 2020-06-12 LAB — ECHOCARDIOGRAM COMPLETE
AR max vel: 2.21 cm2
AV Area VTI: 2.19 cm2
AV Area mean vel: 2.31 cm2
AV Mean grad: 6 mmHg
AV Peak grad: 10.5 mmHg
Ao pk vel: 1.62 m/s
Area-P 1/2: 3.43 cm2
Height: 64 in
MV VTI: 1.57 cm2
S' Lateral: 2.3 cm
Weight: 1848.34 oz

## 2020-06-12 LAB — BASIC METABOLIC PANEL
Anion gap: 9 (ref 5–15)
BUN: 41 mg/dL — ABNORMAL HIGH (ref 8–23)
CO2: 38 mmol/L — ABNORMAL HIGH (ref 22–32)
Calcium: 10.2 mg/dL (ref 8.9–10.3)
Chloride: 90 mmol/L — ABNORMAL LOW (ref 98–111)
Creatinine, Ser: 1.82 mg/dL — ABNORMAL HIGH (ref 0.44–1.00)
GFR, Estimated: 26 mL/min — ABNORMAL LOW (ref >60)
Glucose, Bld: 217 mg/dL — ABNORMAL HIGH (ref 70–99)
Potassium: 4 mmol/L (ref 3.5–5.1)
Sodium: 137 mmol/L (ref 135–145)

## 2020-06-12 NOTE — Progress Notes (Signed)
Progress Note  Patient Name: [name] Date of Encounter: 06/12/2020  [facility] Cardiologist: Sinclair Grooms, MD   Subjective   Converted to NSR spontaneously on Amio yesterday.  Maintaining NSR on exam.  Denies any chest pain or SOB.   Inpatient Medications    Scheduled Meds: . amiodarone  200 mg Oral BID  . apixaban  2.5 mg Oral BID  . arformoterol  15 mcg Nebulization BID   And  . umeclidinium bromide  1 puff Inhalation Daily  . levothyroxine  112 mcg Oral Daily  . metoprolol tartrate  25 mg Oral BID  . predniSONE  10 mg Oral Daily  . simvastatin  20 mg Oral QHS   Continuous Infusions: . sodium chloride     PRN Meds: acetaminophen, albuterol, nitroGLYCERIN, ondansetron (ZOFRAN) IV   Vital Signs    Vitals:   06/11/20 2012 06/11/20 2115 06/11/20 2339 06/12/20 0541  BP:  130/60 126/66 132/62  Pulse: 66 69 (!) 56 (!) 51  Resp: _0 Temp:   99.1 F (37.3 C)   TempSrc:   Oral   SpO2: 97%  97% 97%  Weight:      Height:        Intake/Output Summary (Last 24 hours) at 06/12/2020 0823 Last data filed at 06/11/2020 2255 Gross per 24 hour  Intake 509.67 ml  Output 850 ml  Net -340.33 ml   Last 3 Weights 06/09/2020 06/09/2020 04/24/2020  Weight (lbs) 115 lb 8.3 oz 115 lb 114 lb 11.2 oz  Weight (kg) 52.4 kg 52.164 kg 52.028 kg      Telemetry    NSR - Personally Reviewed  ECG    NSR with prolonged QTc- Personally Reviewed  Physical Exam   GEN: Well nourished, well developed in no acute distress HEENT: Normal NECK: No JVD; No carotid bruits LYMPHATICS: No lymphadenopathy CARDIAC:RRR, no murmurs, rubs, gallops RESPIRATORY:  Clear to auscultation without rales, wheezing or rhonchi  ABDOMEN: Soft, non-tender, non-distended MUSCULOSKELETAL:  No edema; No deformity  SKIN: Warm and dry NEUROLOGIC:  Alert and oriented x 3 PSYCHIATRIC:  Normal affect    Labs    High Sensitivity Troponin:   Recent Labs  Lab 06/09/20 1207  TROPONINIHS 28*       Chemistry Recent Labs  Lab 06/10/20 0049 06/10/20 1132 06/11/20 0311  NA 141 136 133*  K 3.0* 4.5 4.0  CL 93* 92* 86*  CO2 36* 33* 33*  GLUCOSE 102* 183* 123*  BUN 25* 32* 34*  CREATININE 1.80* 1.76* 1.95*  CALCIUM 9.7 9.5 9.8  GFRNONAA 27* 27* 24*  ANIONGAP _1 Hematology Recent Labs  Lab 06/09/20 1309 06/10/20 0049 06/11/20 0311  WBC 9.4 6.7 8.8  RBC 3.99 4.16 4.09  HGB 13.2 13.4 13.2  HCT 40.2 42.2 41.9  MCV 100.8* 101.4* 102.4*  MCH 33.1 32.2 32.3  MCHC 32.8 31.8 31.5  RDW 13.7 13.7 13.8  PLT 110* 111* 110*    BNP Recent Labs  Lab 06/09/20 1207  BNP 367.1*     DDimer No results for input(s): DDIMER in the last 168 hours.  Radiology    No results found.  Cardiac Studies   2D echo  06/25/2019 IMPRESSIONS   1. Left ventricular ejection fraction, by visual estimation, is 60 to  65%. The left ventricle has normal function. There is moderately increased  left ventricular hypertrophy.  2. Left ventricular diastolic parameters are indeterminate.  3.  Global right ventricle has normal systolic function.The right  ventricular size is normal.  4. Left atrial size was normal.  5. Right atrial size was mildly dilated.  6. Moderate thickening of the mitral valve leaflet(s).  7. The mitral valve is normal in structure. Trivial mitral valve  regurgitation.  8. The tricuspid valve is normal in structure. Tricuspid valve  regurgitation is trivial.  9. Bioprosthetic aortic valve with normal function. Aortic valve  regurgitation is not visualized. Aortic valve mean gradient measures 3  mmHg  10. The pulmonic valve was not well visualized. Pulmonic valve  regurgitation is not visualized.  11. The inferior vena cava is normal in size with greater than 50%  respiratory variability, suggesting right atrial pressure of 3 mmHg.  12. The tricuspid regurgitant velocity is 2.59 m/s, and with an assumed  right atrial pressure of 3 mmHg, the  estimated right ventricular systolic  pressure is normal at 29.8 mmHg.   Patient Profile     87 y.o. female with a hx of bioprosthatic AVR who is being seen today for the evaluation of SOB at the request of Dr Tyrone Nine.  Assessment & Plan    1. Aflutter with RVR - new diagnosis - with soft bp's she was started on IV amiodarone land converted to NSR spontaneously - started on Eliquis 2.57m BID (age>80 and wt < 60kg) - repeat EKG today to assess QTc - continue Lopressor 238mBID  - TSH normal in Dec 2021 - keep K+>4  2. Acute on chronic diastolic HF - likely exacerbated by aflutter with RVR - got lasix 4074m 1 in ER and then a second dose of  82m53m - she put out 850cc  yesterday and is net +183cc - SCr bumped from 1.2>1.8>1.76>1.95  with diuretics and stopped yesterday - BMET pending this am - hold further Lasix for now>>suspect rapid afib with reduced renal perfusion as well as diuretics has resulted in AKI  3. History of prosthetic AVR - repeat echo pending  4. HTN - BP meds initially held for soft BP - home doses of amlodipine 2.5mg 69mly and Imdur 15mg 5my held to allow room for titration of BB for HR control - BP elevated and started on Lopressor 25mg B57mor HR and Bp control>>BP well controlled this am  5.  AKI -SCr baseline 1.2 and was at baseline on admit -SCr bumped (1.2>1.8>1.76>1.95) after diuresis -suspect a combination of cardiorenal from afib with RVR and diuresis -continue to hold diuretics  -BMET pending this am  I have spent a total of 35 minutes with patient reviewing 2D echo , telemetry, EKGs, labs and examining patient as well as establishing an assessment and plan that was discussed with the patient.  > 50% of time was spent in direct patient care.    For questions or updates, please contact [facility] consult [email or web address] for contact info under        Signed, Traci TFransico Him/26/2022, 8:23 AM

## 2020-06-12 NOTE — Progress Notes (Signed)
Echocardiogram 2D Echocardiogram has been performed.  Oneal Deputy Babetta Paterson 06/12/2020, 2:35 PM

## 2020-06-13 ENCOUNTER — Inpatient Hospital Stay (HOSPITAL_COMMUNITY): Payer: Medicare Other

## 2020-06-13 ENCOUNTER — Ambulatory Visit: Payer: Medicare Other | Admitting: Emergency Medicine

## 2020-06-13 DIAGNOSIS — M7989 Other specified soft tissue disorders: Secondary | ICD-10-CM

## 2020-06-13 DIAGNOSIS — I5083 High output heart failure: Secondary | ICD-10-CM | POA: Diagnosis not present

## 2020-06-13 DIAGNOSIS — R609 Edema, unspecified: Secondary | ICD-10-CM | POA: Diagnosis not present

## 2020-06-13 DIAGNOSIS — I10 Essential (primary) hypertension: Secondary | ICD-10-CM | POA: Diagnosis not present

## 2020-06-13 DIAGNOSIS — I4891 Unspecified atrial fibrillation: Secondary | ICD-10-CM | POA: Diagnosis not present

## 2020-06-13 DIAGNOSIS — I5033 Acute on chronic diastolic (congestive) heart failure: Secondary | ICD-10-CM

## 2020-06-13 DIAGNOSIS — N179 Acute kidney failure, unspecified: Secondary | ICD-10-CM | POA: Diagnosis not present

## 2020-06-13 LAB — BASIC METABOLIC PANEL
Anion gap: 10 (ref 5–15)
BUN: 36 mg/dL — ABNORMAL HIGH (ref 8–23)
CO2: 37 mmol/L — ABNORMAL HIGH (ref 22–32)
Calcium: 10.3 mg/dL (ref 8.9–10.3)
Chloride: 89 mmol/L — ABNORMAL LOW (ref 98–111)
Creatinine, Ser: 1.58 mg/dL — ABNORMAL HIGH (ref 0.44–1.00)
GFR, Estimated: 31 mL/min — ABNORMAL LOW (ref >60)
Glucose, Bld: 126 mg/dL — ABNORMAL HIGH (ref 70–99)
Potassium: 3.9 mmol/L (ref 3.5–5.1)
Sodium: 136 mmol/L (ref 135–145)

## 2020-06-13 LAB — BLOOD GAS, VENOUS
Acid-Base Excess: 10.9 mmol/L — ABNORMAL HIGH (ref 0.0–2.0)
Bicarbonate: 38.2 mmol/L — ABNORMAL HIGH (ref 20.0–28.0)
Drawn by: 5760
FIO2: 36
O2 Saturation: 79.5 %
Patient temperature: 37
pCO2, Ven: 90.7 mmHg (ref 44.0–60.0)
pH, Ven: 7.248 — ABNORMAL LOW (ref 7.250–7.430)
pO2, Ven: 50.4 mmHg — ABNORMAL HIGH (ref 32.0–45.0)

## 2020-06-13 LAB — D-DIMER, QUANTITATIVE: D-Dimer, Quant: 1.38 ug/mL-FEU — ABNORMAL HIGH (ref 0.00–0.50)

## 2020-06-13 MED ORDER — METOPROLOL SUCCINATE ER 50 MG PO TB24: 50.0000 mg | ORAL_TABLET | Freq: Every day | ORAL | Status: DC

## 2020-06-13 MED ORDER — ADULT MULTIVITAMIN W/MINERALS CH
1.0000 | ORAL_TABLET | Freq: Every day | ORAL | Status: DC
  Administered 2020-06-14 – 2020-06-15 (×2): 1 via ORAL
  Filled 2020-06-13 (×2): qty 1

## 2020-06-13 MED ORDER — ALPRAZOLAM 0.25 MG PO TABS
0.2500 mg | ORAL_TABLET | Freq: Once | ORAL | Status: AC
  Administered 2020-06-13: 0.25 mg via ORAL
  Filled 2020-06-13: qty 1

## 2020-06-13 MED ORDER — METHYLPREDNISOLONE SODIUM SUCC 40 MG IJ SOLR
40.0000 mg | Freq: Four times a day (QID) | INTRAMUSCULAR | Status: DC
  Administered 2020-06-13 – 2020-06-14 (×4): 40 mg via INTRAVENOUS
  Filled 2020-06-13 (×4): qty 1

## 2020-06-13 MED ORDER — ENSURE ENLIVE PO LIQD
237.0000 mL | Freq: Three times a day (TID) | ORAL | Status: DC
  Administered 2020-06-14 – 2020-06-15 (×4): 237 mL via ORAL

## 2020-06-13 MED ORDER — TIOTROPIUM BROMIDE MONOHYDRATE 18 MCG IN CAPS: 18.0000 ug | ORAL_CAPSULE | Freq: Every day | RESPIRATORY_TRACT | Status: DC

## 2020-06-13 MED ORDER — QUETIAPINE FUMARATE 25 MG PO TABS
25.0000 mg | ORAL_TABLET | Freq: Three times a day (TID) | ORAL | Status: DC | PRN
  Administered 2020-06-14: 25 mg via ORAL
  Filled 2020-06-13: qty 1

## 2020-06-13 MED ORDER — FUROSEMIDE 10 MG/ML IJ SOLN
40.0000 mg | Freq: Once | INTRAMUSCULAR | Status: AC
  Administered 2020-06-13: 40 mg via INTRAVENOUS
  Filled 2020-06-13: qty 4

## 2020-06-13 MED ORDER — LEVALBUTEROL HCL 1.25 MG/0.5ML IN NEBU
1.2500 mg | INHALATION_SOLUTION | Freq: Four times a day (QID) | RESPIRATORY_TRACT | Status: DC
  Filled 2020-06-13 (×3): qty 0.5

## 2020-06-13 MED ORDER — LEVALBUTEROL HCL 1.25 MG/0.5ML IN NEBU
1.2500 mg | INHALATION_SOLUTION | Freq: Four times a day (QID) | RESPIRATORY_TRACT | Status: DC
  Administered 2020-06-14 – 2020-06-15 (×5): 1.25 mg via RESPIRATORY_TRACT
  Filled 2020-06-13 (×7): qty 0.5

## 2020-06-13 MED ORDER — ENSURE ENLIVE PO LIQD: 237.0000 mL | Freq: Two times a day (BID) | ORAL | Status: DC

## 2020-06-13 NOTE — Progress Notes (Signed)
VBG reviewed. PCO2>90, start BIPAP 12/5, repeat ABG in AM. May ned Trilogy to go home

## 2020-06-13 NOTE — Progress Notes (Signed)
COVID contact with a reported COVID positive this afternoon. Consider COVID test in next few days.

## 2020-06-13 NOTE — Progress Notes (Addendum)
Patient went down for VQ scan to rule out PE with severe PHTN on echo but could not complete due to SOB when laying down and very anxious.  Cxary shows extensive chronic lung dz c/w emphysema, small bilateral pleural effusions and mild diffuse edema concerning for CHF. Hazy opacities within the left upper lobe may represent an area of asymmetric alveolar edema versus Infection.  SCr improved today.  Will give a dose of Lasix 5m IV now and ask TRH to get involved given hx of COPD and also ? PNA.  Check BNP.  She has no fever and WBC is normal. Suspect that she has worsening of her COPD with acute CHF as well and likely etiology of development of severe PHTN.   She is maintaining NSR and is on Lopressor 215mBID.  Will change to beta 1 selective Toprol XL 5015maily starting in am. Also need to be concerned for acute Amio toxicity.  Her QTC has gone out > 500 on Amio so will stop Amio.  She also has a known lung mass with last chest CT 01/2020. May need to get pulmonary involved.

## 2020-06-13 NOTE — Progress Notes (Signed)
Progress Note  Patient Name: [name] Date of Encounter: 06/13/2020  [facility] Cardiologist: Sinclair Grooms, MD   Subjective   Continues to maintain NSR on Amio.  Anxious to go home.  Denies any chest pain or SOB.   Inpatient Medications    Scheduled Meds: . amiodarone  200 mg Oral BID  . apixaban  2.5 mg Oral BID  . arformoterol  15 mcg Nebulization BID   And  . umeclidinium bromide  1 puff Inhalation Daily  . levothyroxine  112 mcg Oral Daily  . metoprolol tartrate  25 mg Oral BID  . predniSONE  10 mg Oral Daily  . simvastatin  20 mg Oral QHS   Continuous Infusions: . sodium chloride     PRN Meds: acetaminophen, albuterol, nitroGLYCERIN, ondansetron (ZOFRAN) IV   Vital Signs    Vitals:   06/12/20 2006 06/12/20 2058 06/12/20 2200 06/13/20 0500  BP:  130/65  (!) 124/50  Pulse: 64 60 66 (!) 51  Resp: _0 Temp:  98.5 F (36.9 C)  98.1 F (36.7 C)  TempSrc:  Oral  Oral  SpO2: 95% 95%  94%  Weight:    52.4 kg  Height:        Intake/Output Summary (Last 24 hours) at 06/13/2020 0824 Last data filed at 06/13/2020 0121 Gross per 24 hour  Intake 250 ml  Output 950 ml  Net -700 ml   Last 3 Weights 06/13/2020 06/09/2020 06/09/2020  Weight (lbs) 115 lb 9.6 oz 115 lb 8.3 oz 115 lb  Weight (kg) 52.436 kg 52.4 kg 52.164 kg      Telemetry    NSR with bigeminal PACs and PVCs - Personally Reviewed  ECG    NSR with T wave abnormality c/w septal ischemia and QTC 465mec - Personally Reviewed  Physical Exam   GEN: Well nourished, well developed in no acute distress HEENT: Normal NECK: No JVD; No carotid bruits LYMPHATICS: No lymphadenopathy CARDIAC:RRR, no murmurs, rubs, gallops RESPIRATORY:  Clear to auscultation without rales, wheezing or rhonchi  ABDOMEN: Soft, non-tender, non-distended MUSCULOSKELETAL:  No edema; No deformity  SKIN: Warm and dry NEUROLOGIC:  Alert and oriented x 3 PSYCHIATRIC:  Normal affect    Labs    High  Sensitivity Troponin:   Recent Labs  Lab 06/09/20 1207  TROPONINIHS 28*      Chemistry Recent Labs  Lab 06/10/20 1132 06/11/20 0311 06/12/20 0936  NA 136 133* 137  K 4.5 4.0 4.0  CL 92* 86* 90*  CO2 33* 33* 38*  GLUCOSE 183* 123* 217*  BUN 32* 34* 41*  CREATININE 1.76* 1.95* 1.82*  CALCIUM 9.5 9.8 10.2  GFRNONAA 27* 24* 26*  ANIONGAP _1 Hematology Recent Labs  Lab 06/09/20 1309 06/10/20 0049 06/11/20 0311  WBC 9.4 6.7 8.8  RBC 3.99 4.16 4.09  HGB 13.2 13.4 13.2  HCT 40.2 42.2 41.9  MCV 100.8* 101.4* 102.4*  MCH 33.1 32.2 32.3  MCHC 32.8 31.8 31.5  RDW 13.7 13.7 13.8  PLT 110* 111* 110*    BNP Recent Labs  Lab 06/09/20 1207  BNP 367.1*     DDimer No results for input(s): DDIMER in the last 168 hours.  Radiology    ECHOCARDIOGRAM COMPLETE  Result Date: 06/12/2020    ECHOCARDIOGRAM REPORT   Patient Name:   Renee TRYONDate of Exam: 06/12/2020 Medical Rec #:  [id number]    Height:  64.0 in Accession #:    [id number]    Weight:       115.5 lb Date of Birth:  1929-07-21    BSA:          1.549 m Patient Age:    87 years      BP:           121/72 mmHg Patient Gender: F             HR:           62 bpm. Exam Location:  Inpatient Procedure: 2D Echo, Color Doppler and Cardiac Doppler Indications:     I48.91* Unspecified atrial fibrillation  History:         Patient has prior history of Echocardiogram examinations, most                  recent 06/25/2019. Arrythmias:Atrial Fibrillation; Risk                  Factors:Hypertension and Dyslipidemia.  Sonographer:     Raquel Sarna Senior RDCS Referring Phys:  [id number] RHONDA G BARRETT Diagnosing Phys: Jenkins Rouge MD IMPRESSIONS  1. Left ventricular ejection fraction, by estimation, is 60 to 65%. The left ventricle has normal function. The left ventricle has no regional wall motion abnormalities. Left ventricular diastolic parameters are consistent with Grade II diastolic dysfunction (pseudonormalization). Elevated left  ventricular end-diastolic pressure.  2. Right ventricular systolic function is mildly reduced. The right ventricular size is mildly enlarged. There is severely elevated pulmonary artery systolic pressure.  3. Left atrial size was moderately dilated.  4. Right atrial size was moderately dilated.  5. The mitral valve is abnormal. Trivial mitral valve regurgitation. No evidence of mitral stenosis.  6. Tricuspid valve regurgitation is moderate.  7. Post 21 mm Medtronic Freestyle valve normal functioning . The aortic valve has been repaired/replaced. Aortic valve regurgitation is not visualized. No aortic stenosis is present.  8. The inferior vena cava is dilated in size with >50% respiratory variability, suggesting right atrial pressure of 8 mmHg. FINDINGS  Left Ventricle: Left ventricular ejection fraction, by estimation, is 60 to 65%. The left ventricle has normal function. The left ventricle has no regional wall motion abnormalities. The left ventricular internal cavity size was normal in size. There is  no left ventricular hypertrophy. Left ventricular diastolic parameters are consistent with Grade II diastolic dysfunction (pseudonormalization). Elevated left ventricular end-diastolic pressure. Right Ventricle: The right ventricular size is mildly enlarged. No increase in right ventricular wall thickness. Right ventricular systolic function is mildly reduced. There is severely elevated pulmonary artery systolic pressure. The tricuspid regurgitant velocity is 3.69 m/s, and with an assumed right atrial pressure of 15 mmHg, the estimated right ventricular systolic pressure is 93.8 mmHg. Left Atrium: Left atrial size was moderately dilated. Right Atrium: Right atrial size was moderately dilated. Pericardium: There is no evidence of pericardial effusion. Mitral Valve: The mitral valve is abnormal. There is mild thickening of the mitral valve leaflet(s). There is mild calcification of the mitral valve leaflet(s). Mild  mitral annular calcification. Trivial mitral valve regurgitation. No evidence of mitral valve stenosis. MV peak gradient, 14.8 mmHg. The mean mitral valve gradient is 4.0 mmHg. Tricuspid Valve: The tricuspid valve is normal in structure. Tricuspid valve regurgitation is moderate . No evidence of tricuspid stenosis. Aortic Valve: Post 21 mm Medtronic Freestyle valve normal functioning. The aortic valve has been repaired/replaced. Aortic valve regurgitation is not visualized. No aortic stenosis is present. Aortic valve mean  gradient measures 6.0 mmHg. Aortic valve peak gradient measures 10.5 mmHg. Aortic valve area, by VTI measures 2.19 cm. Pulmonic Valve: The pulmonic valve was normal in structure. Pulmonic valve regurgitation is not visualized. No evidence of pulmonic stenosis. Aorta: The aortic root is normal in size and structure. Venous: The inferior vena cava is dilated in size with greater than 50% respiratory variability, suggesting right atrial pressure of 8 mmHg. IAS/Shunts: No atrial level shunt detected by color flow Doppler.  LEFT VENTRICLE PLAX 2D LVIDd:         3.20 cm  Diastology LVIDs:         2.30 cm  LV e' medial:    6.31 cm/s LV PW:         1.10 cm  LV E/e' medial:  29.5 LV IVS:        1.00 cm  LV e' lateral:   5.98 cm/s LVOT diam:     2.00 cm  LV E/e' lateral: 31.1 LV SV:         81 LV SV Index:   52 LVOT Area:     3.14 cm  RIGHT VENTRICLE RV S prime:     10.20 cm/s TAPSE (M-mode): 1.8 cm LEFT ATRIUM             Index       RIGHT ATRIUM           Index LA diam:        4.50 cm 2.90 cm/m  RA Area:     26.70 cm LA Vol (A2C):   80.3 ml 51.83 ml/m RA Volume:   96.70 ml  62.41 ml/m LA Vol (A4C):   82.9 ml 53.51 ml/m LA Biplane Vol: 84.1 ml 54.28 ml/m  AORTIC VALVE AV Area (Vmax):    2.21 cm AV Area (Vmean):   2.31 cm AV Area (VTI):     2.19 cm AV Vmax:           162.00 cm/s AV Vmean:          111.000 cm/s AV VTI:            0.368 m AV Peak Grad:      10.5 mmHg AV Mean Grad:      6.0 mmHg LVOT  Vmax:         114.00 cm/s LVOT Vmean:        81.500 cm/s LVOT VTI:          0.257 m LVOT/AV VTI ratio: 0.70  AORTA Ao Root diam: 3.10 cm MITRAL VALVE                TRICUSPID VALVE MV Area (PHT): 3.43 cm     TR Peak grad:   54.5 mmHg MV Area VTI:   1.57 cm     TR Vmax:        369.00 cm/s MV Peak grad:  14.8 mmHg MV Mean grad:  4.0 mmHg     SHUNTS MV Vmax:       1.92 m/s     Systemic VTI:  0.26 m MV Vmean:      87.1 cm/s    Systemic Diam: 2.00 cm MV Decel Time: 221 msec MV E velocity: 186.00 cm/s MV A velocity: 62.60 cm/s MV E/A ratio:  2.97 Jenkins Rouge MD Electronically signed by Jenkins Rouge MD Signature Date/Time: 06/12/2020/2:39:27 PM    Final (Updated)     Cardiac Studies   2D echo  06/25/2019 IMPRESSIONS   1.  Left ventricular ejection fraction, by visual estimation, is 60 to  65%. The left ventricle has normal function. There is moderately increased  left ventricular hypertrophy.  2. Left ventricular diastolic parameters are indeterminate.  3. Global right ventricle has normal systolic function.The right  ventricular size is normal.  4. Left atrial size was normal.  5. Right atrial size was mildly dilated.  6. Moderate thickening of the mitral valve leaflet(s).  7. The mitral valve is normal in structure. Trivial mitral valve  regurgitation.  8. The tricuspid valve is normal in structure. Tricuspid valve  regurgitation is trivial.  9. Bioprosthetic aortic valve with normal function. Aortic valve  regurgitation is not visualized. Aortic valve mean gradient measures 3  mmHg  10. The pulmonic valve was not well visualized. Pulmonic valve  regurgitation is not visualized.  11. The inferior vena cava is normal in size with greater than 50%  respiratory variability, suggesting right atrial pressure of 3 mmHg.  12. The tricuspid regurgitant velocity is 2.59 m/s, and with an assumed  right atrial pressure of 3 mmHg, the estimated right ventricular systolic  pressure is normal at  29.8 mmHg.   2D echo 05/2020 IMPRESSIONS  1. Left ventricular ejection fraction, by estimation, is 60 to 65%. The  left ventricle has normal function. The left ventricle has no regional  wall motion abnormalities. Left ventricular diastolic parameters are  consistent with Grade II diastolic  dysfunction (pseudonormalization). Elevated left ventricular end-diastolic  pressure.  2. Right ventricular systolic function is mildly reduced. The right  ventricular size is mildly enlarged. There is severely elevated pulmonary  artery systolic pressure.  3. Left atrial size was moderately dilated.  4. Right atrial size was moderately dilated.  5. The mitral valve is abnormal. Trivial mitral valve regurgitation. No  evidence of mitral stenosis.  6. Tricuspid valve regurgitation is moderate.  7. Post 21 mm Medtronic Freestyle valve normal functioning . The aortic  valve has been repaired/replaced. Aortic valve regurgitation is not  visualized. No aortic stenosis is present.  8. The inferior vena cava is dilated in size with >50% respiratory  variability, suggesting right atrial pressure of 8 mmHg.   Patient Profile     87 y.o. female with a hx of bioprosthatic AVR who is being seen today for the evaluation of SOB at the request of Dr Tyrone Nine.  Assessment & Plan    1. Aflutter with RVR - new diagnosis - with soft bp's she was started on IV amiodarone and converted to NSR spontaneously - started on Eliquis 2.70m BID (age>80 and wt < 60kg) - repeat EKG with stable QTc - continue Lopressor 226mBID and amio 20031mID - continue Eliquis 2.5mg20mD (age>80, SCr > 1.5 and wt<60kg) - TSH normal in Dec 2021 - keep K+>4  2. Acute on chronic diastolic HF - likely exacerbated by aflutter with RVR - got lasix 40mg62m in ER and then a second dose of  40mg 54m she put out 950cc  yesterday and is net -517cc - SCr bumped from 1.2>1.8>1.76>1.95 and trending downward  at 1.82 >>diuretics on  hold - BMET pending this am - hold further Lasix for now>>suspect rapid afib with reduced renal perfusion as well as diuretics has resulted in AKI - echo this admit with normal LVF but severe pulmonary HTN (PASP 62mmHg46mpared to 29mmHg 72mar ago) - will get a VQ scan to rule out acute PE. She does have COPD but her PASP was normal  a year ago.  Acute PE could explain new on set aflutter.   3. History of prosthetic AVR - normal valve function on echo this admit  4. HTN - BP meds initially held for soft BP but now improved at 124/6mHg - home doses of amlodipine 2.545mdaily and Imdur 1530maily held to allow room for titration of BB for HR control - continue on Lopressor 88m26mD for HR and Bp control>>BP well controlled this am  5.  AKI -SCr baseline 1.2 and was at baseline on admit -SCr bumped (1.2>1.8>1.76>1.95) after diuresis and improved with holding diuretics -BMET pending this am -suspect a combination of cardiorenal from afib with RVR and diuresis -continue to hold diuretics   6.  Elevated troponin -minimally elevated at 28 and likely related to demand ischemia in the setting of aflutter with RVR and CHF -2D echo with normal LVF -she does have T wave inversions in V1 and V2 but given advanced age and CKD would not pursue further workup unless she has anginal sx  I have spent a total of 35 minutes with patient reviewing 2D echo , telemetry, EKGs, labs and examining patient as well as establishing an assessment and plan that was discussed with the patient.  > 50% of time was spent in direct patient care.    For questions or updates, please contact [email or web address] consult [email or web address] for contact info under        Signed, TracFransico Him  06/13/2020, 8:24 AM

## 2020-06-13 NOTE — Consult Note (Signed)
[facility] Medical Consultation  [name] NWG:[id number] DOB: 08-25-1929 DOA: 06/09/2020 PCP: [name], PA-C   Requesting physician: Sande Rives Date of consultation: 06/13/2020 Reason for consultation: Severe pulmonary hypertension  Impression/Recommendations Principal Problem:   Atrial flutter with rapid ventricular response ([place]) Active Problems:   Chronic obstructive pulmonary disease ([place])   Hypertension   S/P aortic valve replacement and aortoplasty   Hypothyroidism; on synthroid   Hyperlipidemia LDL goal <70   Acute on chronic diastolic CHF (congestive heart failure) ([place])   Severe pulmonary hypertension -Suspect likely cause is overall worsening of her COPD/Group 3 pulmonary HTN, appears that last year patient's pulmonology started her on chronic PO steroid for her worsening of COPD symptoms, and despite, patient reported that her breathing status has been getting worse. -Unable to perform V/Q scan, given patient's current breathing status, discussed with nuclear medicine technician.  CTA relative contraindicated given patient's kidney function. -I will order DVT study as indirect evidence. -Patient has received IV heparin for one day on 06/09/20, during this admission and has been on Eliquis for last 3 days. No report of right heart strain on echo, likely can maintain patient on p.o. anticoagulation. -Overall, I feel even without direct evidence of PE, current anticoagulation is sufficient. And her breathing problem likely and mainly is from her uncontrolled COPD.  Acute on chronic hypoxic respite failure -Likely secondary to acute COPD exacerbation -Switch to p.o. prednisone to IV Solu-Medrol every 6 hours -VBG to rule out CO2 retention, will consider BiPAP if indicated (pH or CO2 issue)  Acute COPD exacerbation -Baseline advanced stage of COPD most recent PFT FEV1 42%= Gold stage III in 2018, and patient became steroid-dependent since summer 2021  for her worsening of COPD. -Around-the-clock, [place] and [place] -VBG and consider BIPAP  Lung CA -Outpatient pulmonology and oncology follow-up. -Unable to perform bronchoscopic mediated biopsy due to poor COPD condition.  HLD -Consider change Simvastatin since pt on Amlodipine.  Afib -Avoid Albuterol  Severe protein calorie malnutrition -BMI 19, consult dietitian  I will followup again tomorrow. Please contact me if I can be of assistance in the meanwhile. Thank you for this consultation.  Chief Complaint: SOB  HPI: 87 year old with possible history of COPD Gold stage III, chronic hypoxic respite failure secondary COPD on home O2 dependent and steroid-dependent, 2 L around-the-clock, recently diagnosed right lung bronchogenic CA, HTN, hypothyroidism presented with new onset of rapid A. Fib.  A. fib was rate controlled, and patient was started on heparin drip and then switched to Eliquis for last 3 days.  Medicine team was consulted for evaluation of new onset of severe pulmonary hypertension.  Patient reported her COPD has been poorly controlled over last year, she woke with severe shortness of breath and easily to get Tired.  Denies any chest pain.  Patient also reported she has been on dialysis for last month for worsening of bilateral leg swelling.  Which she saw some improvement over the last 2 to 3 weeks use of diuresis.  Last year it was some found patient had a lung nodule on the right lung likely malignant.  Patient underwent CT imaging and PET scan, and impression by radiation oncology is bronchogenic CA.  And no metastatic lesion found.  However biopsy not considered due to poor COPD condition.  Review of Systems:  14 points aggressive symptom negative except for mentioned in HPI  Past Medical History:  Diagnosis Date  . Allergic rhinitis   . Aortic stenosis   .  Carotid artery disease ([place])   . Colon polyps   . Congestive heart  failure (CHF) ([place])   . h/o Atrial fibrillation ([place]) 03/22/2011   Brief post op   . Hematuria 11/05/2015  . Hypercalcemia   . Hypertension   . Hypothyroid   . Lung cancer, lower lobe ([place])   . Osteopenia   . 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 Pressure urticaria   . RLS (restless legs syndrome)   . S/P aortic valve replacement and aortoplasty 04/13/2011   62m Medtronic Freestyle porcine aortic root   . S/P aortic valve replacement with stentless valve 03/18/2011   Past Surgical History:  Procedure Laterality Date  . AORTIC ROOT REPLACEMENT  03/18/2011   213mMedtronic Freestyle Porcine aortic root with reimplantation of coronary arteries  . BREAST LUMPECTOMY     Left  . CATARACT EXTRACTION    . CHOLECYSTECTOMY    . FOOT SURGERY     Morton's neuroma  . KNEE ARTHROSCOPY     Left   Social History:  reports that she quit smoking about 9 years ago. Her smoking use included cigarettes. She has a 55.80 pack-year smoking history. She has never used smokeless tobacco. She reports that she does not drink alcohol and does not use drugs.  Allergies  Allergen Reactions  . Clarithromycin Other (See Comments)    Made the mouth/throat sore  . Tamsulosin Itching  . Trazodone And Nefazodone Other (See Comments)    Dizziness   . Penicillins Itching and Rash    Did it involve swelling of the face/tongue/throat, SOB, or low BP? No Did it involve sudden or severe rash/hives, skin peeling, or any reaction on the inside of your mouth or nose? Yes Did you need to seek medical attention at a hospital or doctor's office? Yes When did it last happen?Young woman  If all above answers are "NO", may proceed with cephalosporin use.    Family History  Problem Relation Age of Onset  . Cancer Mother        bladder  . Lung cancer Father   . Heart disease Maternal Grandmother   . Emphysema Maternal Aunt   . Asthma Maternal Aunt   . Asthma Maternal Uncle   . Breast cancer Sister   . Uterine cancer  Sister     Prior to Admission medications   Medication Sig Start Date End Date Taking? Authorizing Provider  acetaminophen (TYLENOL) 500 MG tablet Take 500 mg by mouth every 6 (six) hours as needed. Take 3 tablets every 6 hours   Yes [provider]  albuterol (PROAIR HFA) 108 (90 Base) MCG/ACT inhaler Inhale 2 puffs into the lungs every 6 (six) hours as needed for wheezing or shortness of breath. 11/30/19  Yes ByCollene GobbleMD  amLODipine (NORVASC) 2.5 MG tablet Take 1 tablet by mouth once daily Patient taking differently: Take 2.5 mg by mouth daily. 04/16/20  Yes AbLorrene ReidPA-C  aspirin EC 81 MG tablet Take 81 mg by mouth daily.   Yes [provider]  Cholecalciferol (VITAMIN D-3 PO) Take 1 capsule by mouth daily.   Yes [provider]  furosemide (LASIX) 20 MG tablet Take 1 tablet (20 mg total) by mouth 2 (two) times daily. 06/07/20  Yes AbLorrene ReidPA-C  levothyroxine (SYNTHROID) 112 MCG tablet Take 1 tablet by mouth once daily Patient taking differently: Take 112 mcg by mouth daily before breakfast. 04/29/20  Yes Abonza, Maritza, PA-C  Melatonin 10 MG TABS Take 10 mg  by mouth at bedtime.   Yes [provider]  Omega-3 Fatty Acids (FISH OIL) 1000 MG CAPS Take 1,000 mg by mouth daily.   Yes [provider]  OXYGEN Inhale 2-3 L/min into the lungs See admin instructions. Inhale 2 L/min into the lungs continuously and 3 L/min with any exertion   Yes [provider]  predniSONE (DELTASONE) 10 MG tablet Take 1 tablet by mouth once daily with breakfast Patient taking differently: Take 10 mg by mouth daily with breakfast. 04/30/20  Yes Byrum, Rose Fillers, MD  simvastatin (ZOCOR) 20 MG tablet Take 1 tablet (20 mg total) by mouth at bedtime. 12/13/19  Yes Abonza, Maritza, PA-C  STIOLTO RESPIMAT 2.5-2.5 MCG/ACT AERS INHALE 2 PUFFS BY MOUTH ONCE DAILY. PLEASE SEE DR FOR REFILLS. Patient taking differently: Inhale 2 puffs into the lungs  daily. 09/15/19  Yes Collene Gobble, MD  amitriptyline (ELAVIL) 10 MG tablet Take 1 tablet (10 mg total) by mouth at bedtime. Patient not taking: No sig reported 10/04/19   Raulkar, Clide Deutscher, MD  doxycycline (VIBRA-TABS) 100 MG tablet Take 1 tablet (100 mg total) by mouth 2 (two) times daily. Patient not taking: Reported on 06/09/2020 04/24/20   [name], PA-C  isosorbide mononitrate (IMDUR) 30 MG 24 hr tablet Take 0.5 tablets (15 mg total) by mouth daily. Patient not taking: No sig reported 06/27/19   Radene Gunning, NP   Physical Exam: Blood pressure 116/62, pulse 61, temperature 98.2 F (36.8 C), temperature source Oral, resp. rate 18, height 5' 4" (1.626 m), weight 52.4 kg, SpO2 96 %. Vitals:   06/13/20 0825 06/13/20 1022  BP: 127/64 116/62  Pulse: 60 61  Resp: 18   Temp: 98.2 F (36.8 C)   SpO2: 96%      General: Moderate respiratory distress  Eyes: PERRL  ENT: Mucosa moist  Neck: Supple no JVD  Cardiovascular: RRR, no murmurs  Respiratory: Diminished breathing sound bilaterally, especially expiratory, scattered wheezing, increasing breathing effort.  2+ bilateral pitting edema more on the right.  Abdomen: Soft nontender nondistended  Skin: No rash  Musculoskeletal: Normal ROM  Psychiatric: Calm  Neurologic: No focal deficit  Labs on Admission:  Basic Metabolic Panel: Recent Labs  Lab 06/09/20 1207 06/09/20 1216 06/10/20 0049 06/10/20 1132 06/11/20 0311 06/12/20 0936 06/13/20 0643  NA  --    < > 141 136 133* 137 136  K  --    < > 3.0* 4.5 4.0 4.0 3.9  CL  --    < > 93* 92* 86* 90* 89*  CO2  --   --  36* 33* 33* 38* 37*  GLUCOSE  --    < > 102* 183* 123* 217* 126*  BUN  --    < > 25* 32* 34* 41* 36*  CREATININE  --    < > 1.80* 1.76* 1.95* 1.82* 1.58*  CALCIUM  --   --  9.7 9.5 9.8 10.2 10.3  MG 1.9  --  2.2  --  2.4  --   --    < > = values in this interval not displayed.   Liver Function Tests: No results for input(s): AST, ALT, ALKPHOS,  BILITOT, PROT, ALBUMIN in the last 168 hours. No results for input(s): LIPASE, AMYLASE in the last 168 hours. No results for input(s): AMMONIA in the last 168 hours. CBC: Recent Labs  Lab 06/09/20 1216 06/09/20 1309 06/10/20 0049 06/11/20 0311  WBC  --  9.4 6.7 8.8  NEUTROABS  --  8.6*  --   --   HGB 11.9* 13.2 13.4 13.2  HCT 35.0* 40.2 42.2 41.9  MCV  --  100.8* 101.4* 102.4*  PLT  --  110* 111* 110*   Cardiac Enzymes: No results for input(s): CKTOTAL, CKMB, CKMBINDEX, TROPONINI in the last 168 hours. BNP: Invalid input(s): POCBNP CBG: No results for input(s): GLUCAP in the last 168 hours.  Radiological Exams on Admission: DG CHEST PORT 1 VIEW  Result Date: 06/13/2020 CLINICAL DATA:  Shortness of breath. EXAM: PORTABLE CHEST 1 VIEW COMPARISON:  06/09/2020 FINDINGS: Previous median sternotomy. Stable cardiac enlargement. Extensive chronic lung disease is identified bilaterally compatible with emphysema. There are small bilateral pleural effusions and mild diffuse edema concerning for CHF. Hazy opacities within the left upper lobe may represent an area of asymmetric alveolar edema versus infection. IMPRESSION: 1. Congestive heart failure. 2. Hazy opacities within the left upper lobe may represent asymmetric alveolar edema versus infection. Electronically Signed   By: Kerby Moors M.D.   On: 06/13/2020 09:43   ECHOCARDIOGRAM COMPLETE  Result Date: 06/12/2020    ECHOCARDIOGRAM REPORT   Patient Name:   [name] Date of Exam: 06/12/2020 Medical Rec #:  [id number]     Height:       64.0 in Accession #:    [id number]    Weight:       115.5 lb Date of Birth:  1929-07-08    BSA:          1.549 m Patient Age:    65 years      BP:           121/72 mmHg Patient Gender: F             HR:           62 bpm. Exam Location:  Inpatient Procedure: 2D Echo, Color Doppler and Cardiac Doppler Indications:     I48.91* Unspecified atrial fibrillation  History:         Patient has prior history of  Echocardiogram examinations, most                  recent 06/25/2019. Arrythmias:Atrial Fibrillation; Risk                  Factors:Hypertension and Dyslipidemia.  Sonographer:     Raquel Sarna Senior RDCS Referring Phys:  [id number] RHONDA G BARRETT Diagnosing Phys: Jenkins Rouge MD IMPRESSIONS  1. Left ventricular ejection fraction, by estimation, is 60 to 65%. The left ventricle has normal function. The left ventricle has no regional wall motion abnormalities. Left ventricular diastolic parameters are consistent with Grade II diastolic dysfunction (pseudonormalization). Elevated left ventricular end-diastolic pressure.  2. Right ventricular systolic function is mildly reduced. The right ventricular size is mildly enlarged. There is severely elevated pulmonary artery systolic pressure.  3. Left atrial size was moderately dilated.  4. Right atrial size was moderately dilated.  5. The mitral valve is abnormal. Trivial mitral valve regurgitation. No evidence of mitral stenosis.  6. Tricuspid valve regurgitation is moderate.  7. Post 21 mm Medtronic Freestyle valve normal functioning . The aortic valve has been repaired/replaced. Aortic valve regurgitation is not visualized. No aortic stenosis is present.  8. The inferior vena cava is dilated in size with >50% respiratory variability, suggesting right atrial pressure of 8 mmHg. FINDINGS  Left Ventricle: Left ventricular ejection fraction, by estimation, is 60 to 65%. The left ventricle has normal function. The left ventricle has no regional wall motion abnormalities.  The left ventricular internal cavity size was normal in size. There is  no left ventricular hypertrophy. Left ventricular diastolic parameters are consistent with Grade II diastolic dysfunction (pseudonormalization). Elevated left ventricular end-diastolic pressure. Right Ventricle: The right ventricular size is mildly enlarged. No increase in right ventricular wall thickness. Right ventricular systolic function is  mildly reduced. There is severely elevated pulmonary artery systolic pressure. The tricuspid regurgitant velocity is 3.69 m/s, and with an assumed right atrial pressure of 15 mmHg, the estimated right ventricular systolic pressure is 28.4 mmHg. Left Atrium: Left atrial size was moderately dilated. Right Atrium: Right atrial size was moderately dilated. Pericardium: There is no evidence of pericardial effusion. Mitral Valve: The mitral valve is abnormal. There is mild thickening of the mitral valve leaflet(s). There is mild calcification of the mitral valve leaflet(s). Mild mitral annular calcification. Trivial mitral valve regurgitation. No evidence of mitral valve stenosis. MV peak gradient, 14.8 mmHg. The mean mitral valve gradient is 4.0 mmHg. Tricuspid Valve: The tricuspid valve is normal in structure. Tricuspid valve regurgitation is moderate . No evidence of tricuspid stenosis. Aortic Valve: Post 21 mm Medtronic Freestyle valve normal functioning. The aortic valve has been repaired/replaced. Aortic valve regurgitation is not visualized. No aortic stenosis is present. Aortic valve mean gradient measures 6.0 mmHg. Aortic valve peak gradient measures 10.5 mmHg. Aortic valve area, by VTI measures 2.19 cm. Pulmonic Valve: The pulmonic valve was normal in structure. Pulmonic valve regurgitation is not visualized. No evidence of pulmonic stenosis. Aorta: The aortic root is normal in size and structure. Venous: The inferior vena cava is dilated in size with greater than 50% respiratory variability, suggesting right atrial pressure of 8 mmHg. IAS/Shunts: No atrial level shunt detected by color flow Doppler.  LEFT VENTRICLE PLAX 2D LVIDd:         3.20 cm  Diastology LVIDs:         2.30 cm  LV e' medial:    6.31 cm/s LV PW:         1.10 cm  LV E/e' medial:  29.5 LV IVS:        1.00 cm  LV e' lateral:   5.98 cm/s LVOT diam:     2.00 cm  LV E/e' lateral: 31.1 LV SV:         81 LV SV Index:   52 LVOT Area:     3.14 cm   RIGHT VENTRICLE RV S prime:     10.20 cm/s TAPSE (M-mode): 1.8 cm LEFT ATRIUM             Index       RIGHT ATRIUM           Index LA diam:        4.50 cm 2.90 cm/m  RA Area:     26.70 cm LA Vol (A2C):   80.3 ml 51.83 ml/m RA Volume:   96.70 ml  62.41 ml/m LA Vol (A4C):   82.9 ml 53.51 ml/m LA Biplane Vol: 84.1 ml 54.28 ml/m  AORTIC VALVE AV Area (Vmax):    2.21 cm AV Area (Vmean):   2.31 cm AV Area (VTI):     2.19 cm AV Vmax:           162.00 cm/s AV Vmean:          111.000 cm/s AV VTI:            0.368 m AV Peak Grad:      10.5 mmHg AV Mean  Grad:      6.0 mmHg LVOT Vmax:         114.00 cm/s LVOT Vmean:        81.500 cm/s LVOT VTI:          0.257 m LVOT/AV VTI ratio: 0.70  AORTA Ao Root diam: 3.10 cm MITRAL VALVE                TRICUSPID VALVE MV Area (PHT): 3.43 cm     TR Peak grad:   54.5 mmHg MV Area VTI:   1.57 cm     TR Vmax:        369.00 cm/s MV Peak grad:  14.8 mmHg MV Mean grad:  4.0 mmHg     SHUNTS MV Vmax:       1.92 m/s     Systemic VTI:  0.26 m MV Vmean:      87.1 cm/s    Systemic Diam: 2.00 cm MV Decel Time: 221 msec MV E velocity: 186.00 cm/s MV A velocity: 62.60 cm/s MV E/A ratio:  2.97 Jenkins Rouge MD Electronically signed by Jenkins Rouge MD Signature Date/Time: 06/12/2020/2:39:27 PM    Final (Updated)     EKG: Independently reviewed.   Time spent: [facility] Pager [phone] 06/13/2020, 2:33 PM

## 2020-06-13 NOTE — Progress Notes (Signed)
Initial Nutrition Assessment  DOCUMENTATION CODES:   Not applicable  INTERVENTION:    Ensure Enlive po TID, each supplement provides 350 kcal and 20 grams of protein  MVI with minerals daily  NUTRITION DIAGNOSIS:   Increased nutrient needs related to chronic illness (COPD, CHF) as evidenced by estimated needs.  GOAL:   Patient will meet greater than or equal to 90% of their needs  MONITOR:   PO intake,Supplement acceptance  REASON FOR ASSESSMENT:   Consult Assessment of nutrition requirement/status  ASSESSMENT:   87 yo female admitted with acute COPD exacerbation, severe pulmonary HTN, A flutter with RVR. PMH includes COPD, HTN, AVR, hypothyroidism, HLD, CHF.   Currently on a heart healthy diet, meal intakes documented at 75% x 1 meal (1/26 Lunch). Unable to speak with patient or complete nutrition focused physical exam at this time. Suspect patient is malnourished, however, unable to obtain enough information at this time for identification of malnutrition. Ensure has been ordered BID, patient has not been offered one yet, will increase to TID.   Usual weights reviewed. 6 months ago, patient weighed 47.4 kg, currently 10% above that weight at 52.4 kg.  Labs reviewed. Medications reviewed and include solumedrol.   NUTRITION - FOCUSED PHYSICAL EXAM:  unable to complete  Diet Order:   Diet Order            Diet Heart Room service appropriate? Yes; Fluid consistency: Thin  Diet effective now                 EDUCATION NEEDS:   Not appropriate for education at this time  Skin:  Skin Assessment: Reviewed RN Assessment  Last BM:  1/27  Height:   Ht Readings from Last 1 Encounters:  06/09/20 _0  (1.626 m)    Weight:   Wt Readings from Last 1 Encounters:  06/13/20 52.4 kg    Ideal Body Weight:  54.5 kg  BMI:  Body mass index is 19.84 kg/m.  Estimated Nutritional Needs:   Kcal:  1400-1600  Protein:  70-80 gm  Fluid:  1.5 L    Lucas Mallow, RD, LDN, CNSC Please refer to Amion for contact information.

## 2020-06-13 NOTE — Progress Notes (Signed)
Lower extremity venous has been completed.   Preliminary results in CV Proc.   Abram Sander 06/13/2020 3:35 PM

## 2020-06-13 NOTE — Progress Notes (Addendum)
I received page about patient feeling more short of breath. Called and spoke with [employer] Patient was taken down for V/Q scan but reportedly would not lay down flat for this. Every since coming back upstairs she has been complaining of feeling short of breath. She took a dose of her albuterol inhaler with no improvement. I went to bedside to see patient. She is in no acute distress. Currently on 5L of nasal cannula (on 3L at home). She has decreased breath sounds in bases but no crackles or wheezes noted on exam. When I asked by she could not complete V/Q, she states it was because she was short of breath. She states she has been short of breath all day. Repeat chest x-ray this morning showed extensive chronic lung disease compatible with emphysema, small bilateral pleural effusions, and mild diffuse edema concerning for CHF. Also showed hazy opacities within the left upper lobe which may represent asymmetric alveolar edema vs infection.  I explained to her why we ordered the V/Q scan but she states she was not able to lay down. After speaking with RN, there was concern that there may be an anxiety component. Discussed with Dr. Radford Pax, we will check D-dimer. Will also give one dose of Xanax 0.19m to see if that helps some. Will also consult [facility] to see if they can help with possible COPD component.  CDarreld Mclean PA-C 06/13/2020 1:15 PM

## 2020-06-13 NOTE — Progress Notes (Signed)
Curbside consult to Oncology, who recommend brain MRI to rule out brain mets other than PET scan.  But given pt's breathing condition and unable to lie flat even for more than 1 min today for V/Q scan, will postpone brain MRI till her breathing condition improves.  DVT study negative and VBG pending.

## 2020-06-14 DIAGNOSIS — I4892 Unspecified atrial flutter: Secondary | ICD-10-CM | POA: Diagnosis not present

## 2020-06-14 LAB — BASIC METABOLIC PANEL
Anion gap: 10 (ref 5–15)
BUN: 33 mg/dL — ABNORMAL HIGH (ref 8–23)
CO2: 37 mmol/L — ABNORMAL HIGH (ref 22–32)
Calcium: 10.2 mg/dL (ref 8.9–10.3)
Chloride: 88 mmol/L — ABNORMAL LOW (ref 98–111)
Creatinine, Ser: 1.45 mg/dL — ABNORMAL HIGH (ref 0.44–1.00)
GFR, Estimated: 35 mL/min — ABNORMAL LOW (ref >60)
Glucose, Bld: 151 mg/dL — ABNORMAL HIGH (ref 70–99)
Potassium: 4.6 mmol/L (ref 3.5–5.1)
Sodium: 135 mmol/L (ref 135–145)

## 2020-06-14 LAB — CBC
HCT: 40.9 % (ref 36.0–46.0)
Hemoglobin: 12.8 g/dL (ref 12.0–15.0)
MCH: 32.2 pg (ref 26.0–34.0)
MCHC: 31.3 g/dL (ref 30.0–36.0)
MCV: 103 fL — ABNORMAL HIGH (ref 80.0–100.0)
Platelets: 105 10*3/uL — ABNORMAL LOW (ref 150–400)
RBC: 3.97 MIL/uL (ref 3.87–5.11)
RDW: 13.2 % (ref 11.5–15.5)
WBC: 3.6 10*3/uL — ABNORMAL LOW (ref 4.0–10.5)
nRBC: 0 % (ref 0.0–0.2)

## 2020-06-14 LAB — BLOOD GAS, ARTERIAL
Acid-Base Excess: 14 mmol/L — ABNORMAL HIGH (ref 0.0–2.0)
Bicarbonate: 39.6 mmol/L — ABNORMAL HIGH (ref 20.0–28.0)
Drawn by: 51133
FIO2: 40
O2 Saturation: 97.1 %
Patient temperature: 36.5
pCO2 arterial: 64.9 mmHg — ABNORMAL HIGH (ref 32.0–48.0)
pH, Arterial: 7.4 (ref 7.350–7.450)
pO2, Arterial: 84.3 mmHg (ref 83.0–108.0)

## 2020-06-14 LAB — MAGNESIUM: Magnesium: 2.3 mg/dL (ref 1.7–2.4)

## 2020-06-14 MED ORDER — METHYLPREDNISOLONE SODIUM SUCC 40 MG IJ SOLR
40.0000 mg | Freq: Three times a day (TID) | INTRAMUSCULAR | Status: DC
  Administered 2020-06-14 – 2020-06-15 (×3): 40 mg via INTRAVENOUS
  Filled 2020-06-14 (×3): qty 1

## 2020-06-14 MED ORDER — METOPROLOL SUCCINATE ER 25 MG PO TB24
25.0000 mg | ORAL_TABLET | Freq: Every day | ORAL | Status: DC
  Administered 2020-06-15: 25 mg via ORAL
  Filled 2020-06-14: qty 1

## 2020-06-14 NOTE — Progress Notes (Addendum)
Progress Note  Patient Name: [name] Date of Encounter: 06/14/2020  [facility] Cardiologist: Sinclair Grooms, MD   Subjective   Patient feels she is breathing a little better today but still unsure if she can lay flat for V/Q scan.Marland Kitchen She was on BiPAP overnight due to high pCO2 in th 90's. pCO2 64.9 on ABG this morning. Off BiPAP now and on 5L via nasal cannula (on 3L at home). No chest pain.  Inpatient Medications    Scheduled Meds: . apixaban  2.5 mg Oral BID  . arformoterol  15 mcg Nebulization BID   And  . umeclidinium bromide  1 puff Inhalation Daily  . feeding supplement  237 mL Oral TID BM  . levalbuterol  1.25 mg Nebulization Q6H  . levothyroxine  112 mcg Oral Daily  . methylPREDNISolone (SOLU-MEDROL) injection  40 mg Intravenous Q6H  . metoprolol succinate  50 mg Oral Daily  . multivitamin with minerals  1 tablet Oral Daily  . simvastatin  20 mg Oral QHS   Continuous Infusions: . sodium chloride     PRN Meds: acetaminophen, nitroGLYCERIN, ondansetron (ZOFRAN) IV, QUEtiapine   Vital Signs    Vitals:   06/14/20 0001 06/14/20 0229 06/14/20 0300 06/14/20 0416  BP: (!) 110/55  (!) 150/70 (!) 110/52  Pulse: (!) 49  (!) 47 (!) 48  Resp: 16  17 (!) 21  Temp: 97.8 F (36.6 C)  (!) 97.2 F (36.2 C) 97.8 F (36.6 C)  TempSrc: Axillary  Axillary Oral  SpO2: 98% 97% 98% 97%  Weight:    53.1 kg  Height:        Intake/Output Summary (Last 24 hours) at 06/14/2020 0743 Last data filed at 06/14/2020 0547 Gross per 24 hour  Intake --  Output 1300 ml  Net -1300 ml   Last 3 Weights 06/14/2020 06/13/2020 06/09/2020  Weight (lbs) 117 lb 115 lb 9.6 oz 115 lb 8.3 oz  Weight (kg) 53.071 kg 52.436 kg 52.4 kg      Telemetry    Sinus rhythm with rates in the high 40's to 60's. - Personally Reviewed  ECG    No new ECG tracing today. - Personally Reviewed  Physical Exam   GEN: No acute distress. On 5L of O2 via nasal cannula. Neck: No JVD. Cardiac: RRR. No  murmurs, rubs, or gallops.  Respiratory: No increased work of breathing. Clear to auscultation bilaterally. Moving air better today. GI: Soft, non-distended, and non-tender.  MS: No lower extremity edema. No deformity. Skin: Warm and dry. Neuro:  No focal deficits. Psych: Normal affect. Responds appropriately.  Labs    High Sensitivity Troponin:   Recent Labs  Lab 06/09/20 1207  TROPONINIHS 28*      Chemistry Recent Labs  Lab 06/12/20 0936 06/13/20 0643 06/14/20 0411  NA 137 136 135  K 4.0 3.9 4.6  CL 90* 89* 88*  CO2 38* 37* 37*  GLUCOSE 217* 126* 151*  BUN 41* 36* 33*  CREATININE 1.82* 1.58* 1.45*  CALCIUM 10.2 10.3 10.2  GFRNONAA 26* 31* 35*  ANIONGAP _0 Hematology Recent Labs  Lab 06/10/20 0049 06/11/20 0311 06/14/20 0411  WBC 6.7 8.8 3.6*  RBC 4.16 4.09 3.97  HGB 13.4 13.2 12.8  HCT 42.2 41.9 40.9  MCV 101.4* 102.4* 103.0*  MCH 32.2 32.3 32.2  MCHC 31.8 31.5 31.3  RDW 13.7 13.8 13.2  PLT 111* 110* 105*    BNP Recent Labs  Lab 06/09/20 1207  BNP 367.1*     DDimer  Recent Labs  Lab 06/13/20 1426  DDIMER 1.38*     Radiology    DG CHEST PORT 1 VIEW  Result Date: 06/13/2020 CLINICAL DATA:  Shortness of breath. EXAM: PORTABLE CHEST 1 VIEW COMPARISON:  06/09/2020 FINDINGS: Previous median sternotomy. Stable cardiac enlargement. Extensive chronic lung disease is identified bilaterally compatible with emphysema. There are small bilateral pleural effusions and mild diffuse edema concerning for CHF. Hazy opacities within the left upper lobe may represent an area of asymmetric alveolar edema versus infection. IMPRESSION: 1. Congestive heart failure. 2. Hazy opacities within the left upper lobe may represent asymmetric alveolar edema versus infection. Electronically Signed   By: Kerby Moors M.D.   On: 06/13/2020 09:43   ECHOCARDIOGRAM COMPLETE  Result Date: 06/12/2020    ECHOCARDIOGRAM REPORT   Patient Name:   [name] Date of Exam:  06/12/2020 Medical Rec #:  [id number]     Height:       64.0 in Accession #:    [id number]    Weight:       115.5 lb Date of Birth:  [birth year]    BSA:          1.549 m Patient Age:    87 years      BP:           121/72 mmHg Patient Gender: F             HR:           62 bpm. Exam Location:  Inpatient Procedure: 2D Echo, Color Doppler and Cardiac Doppler Indications:     I48.91* Unspecified atrial fibrillation  History:         Patient has prior history of Echocardiogram examinations, most                  recent 06/25/2019. Arrythmias:Atrial Fibrillation; Risk                  Factors:Hypertension and Dyslipidemia.  Sonographer:     Raquel Sarna Senior RDCS Referring Phys:  [id number] RHONDA G BARRETT Diagnosing Phys: Jenkins Rouge MD IMPRESSIONS  1. Left ventricular ejection fraction, by estimation, is 60 to 65%. The left ventricle has normal function. The left ventricle has no regional wall motion abnormalities. Left ventricular diastolic parameters are consistent with Grade II diastolic dysfunction (pseudonormalization). Elevated left ventricular end-diastolic pressure.  2. Right ventricular systolic function is mildly reduced. The right ventricular size is mildly enlarged. There is severely elevated pulmonary artery systolic pressure.  3. Left atrial size was moderately dilated.  4. Right atrial size was moderately dilated.  5. The mitral valve is abnormal. Trivial mitral valve regurgitation. No evidence of mitral stenosis.  6. Tricuspid valve regurgitation is moderate.  7. Post 21 mm Medtronic Freestyle valve normal functioning . The aortic valve has been repaired/replaced. Aortic valve regurgitation is not visualized. No aortic stenosis is present.  8. The inferior vena cava is dilated in size with >50% respiratory variability, suggesting right atrial pressure of 8 mmHg. FINDINGS  Left Ventricle: Left ventricular ejection fraction, by estimation, is 60 to 65%. The left ventricle has normal function. The left ventricle has no  regional wall motion abnormalities. The left ventricular internal cavity size was normal in size. There is  no left ventricular hypertrophy. Left ventricular diastolic parameters are consistent with Grade II diastolic dysfunction (pseudonormalization). Elevated left ventricular end-diastolic pressure. Right Ventricle: The right ventricular size is mildly  enlarged. No increase in right ventricular wall thickness. Right ventricular systolic function is mildly reduced. There is severely elevated pulmonary artery systolic pressure. The tricuspid regurgitant velocity is 3.69 m/s, and with an assumed right atrial pressure of 15 mmHg, the estimated right ventricular systolic pressure is 71.0 mmHg. Left Atrium: Left atrial size was moderately dilated. Right Atrium: Right atrial size was moderately dilated. Pericardium: There is no evidence of pericardial effusion. Mitral Valve: The mitral valve is abnormal. There is mild thickening of the mitral valve leaflet(s). There is mild calcification of the mitral valve leaflet(s). Mild mitral annular calcification. Trivial mitral valve regurgitation. No evidence of mitral valve stenosis. MV peak gradient, 14.8 mmHg. The mean mitral valve gradient is 4.0 mmHg. Tricuspid Valve: The tricuspid valve is normal in structure. Tricuspid valve regurgitation is moderate . No evidence of tricuspid stenosis. Aortic Valve: Post 21 mm Medtronic Freestyle valve normal functioning. The aortic valve has been repaired/replaced. Aortic valve regurgitation is not visualized. No aortic stenosis is present. Aortic valve mean gradient measures 6.0 mmHg. Aortic valve peak gradient measures 10.5 mmHg. Aortic valve area, by VTI measures 2.19 cm. Pulmonic Valve: The pulmonic valve was normal in structure. Pulmonic valve regurgitation is not visualized. No evidence of pulmonic stenosis. Aorta: The aortic root is normal in size and structure. Venous: The inferior vena cava is dilated in size with greater than  50% respiratory variability, suggesting right atrial pressure of 8 mmHg. IAS/Shunts: No atrial level shunt detected by color flow Doppler.  LEFT VENTRICLE PLAX 2D LVIDd:         3.20 cm  Diastology LVIDs:         2.30 cm  LV e' medial:    6.31 cm/s LV PW:         1.10 cm  LV E/e' medial:  29.5 LV IVS:        1.00 cm  LV e' lateral:   5.98 cm/s LVOT diam:     2.00 cm  LV E/e' lateral: 31.1 LV SV:         81 LV SV Index:   52 LVOT Area:     3.14 cm  RIGHT VENTRICLE RV S prime:     10.20 cm/s TAPSE (M-mode): 1.8 cm LEFT ATRIUM             Index       RIGHT ATRIUM           Index LA diam:        4.50 cm 2.90 cm/m  RA Area:     26.70 cm LA Vol (A2C):   80.3 ml 51.83 ml/m RA Volume:   96.70 ml  62.41 ml/m LA Vol (A4C):   82.9 ml 53.51 ml/m LA Biplane Vol: 84.1 ml 54.28 ml/m  AORTIC VALVE AV Area (Vmax):    2.21 cm AV Area (Vmean):   2.31 cm AV Area (VTI):     2.19 cm AV Vmax:           162.00 cm/s AV Vmean:          111.000 cm/s AV VTI:            0.368 m AV Peak Grad:      10.5 mmHg AV Mean Grad:      6.0 mmHg LVOT Vmax:         114.00 cm/s LVOT Vmean:        81.500 cm/s LVOT VTI:          0.257  m LVOT/AV VTI ratio: 0.70  AORTA Ao Root diam: 3.10 cm MITRAL VALVE                TRICUSPID VALVE MV Area (PHT): 3.43 cm     TR Peak grad:   54.5 mmHg MV Area VTI:   1.57 cm     TR Vmax:        369.00 cm/s MV Peak grad:  14.8 mmHg MV Mean grad:  4.0 mmHg     SHUNTS MV Vmax:       1.92 m/s     Systemic VTI:  0.26 m MV Vmean:      87.1 cm/s    Systemic Diam: 2.00 cm MV Decel Time: 221 msec MV E velocity: 186.00 cm/s MV A velocity: 62.60 cm/s MV E/A ratio:  2.97 Jenkins Rouge MD Electronically signed by Jenkins Rouge MD Signature Date/Time: 06/12/2020/2:39:27 PM    Final (Updated)    VAS [place] LOWER EXTREMITY VENOUS (DVT)  Result Date: 06/13/2020  Lower Venous DVT Study Indications: Swelling, and Edema.  Comparison Study: no prior Performing Technologist: Abram Sander RVS  Examination Guidelines: A complete evaluation  includes B-mode imaging, spectral Doppler, color Doppler, and power Doppler as needed of all accessible portions of each vessel. Bilateral testing is considered an integral part of a complete examination. Limited examinations for reoccurring indications may be performed as noted. The reflux portion of the exam is performed with the patient in reverse Trendelenburg.  +---------+---------------+---------+-----------+----------+--------------+ RIGHT    CompressibilityPhasicitySpontaneityPropertiesThrombus Aging +---------+---------------+---------+-----------+----------+--------------+ CFV      Full           Yes      Yes                                 +---------+---------------+---------+-----------+----------+--------------+ SFJ      Full                                                        +---------+---------------+---------+-----------+----------+--------------+ FV Prox  Full                                                        +---------+---------------+---------+-----------+----------+--------------+ FV Mid   Full                                                        +---------+---------------+---------+-----------+----------+--------------+ FV DistalFull                                                        +---------+---------------+---------+-----------+----------+--------------+ PFV      Full                                                        +---------+---------------+---------+-----------+----------+--------------+  POP      Full           Yes      Yes                                 +---------+---------------+---------+-----------+----------+--------------+ PTV      Full                                                        +---------+---------------+---------+-----------+----------+--------------+ PERO     Full                                                         +---------+---------------+---------+-----------+----------+--------------+   +---------+---------------+---------+-----------+----------+--------------+ LEFT     CompressibilityPhasicitySpontaneityPropertiesThrombus Aging +---------+---------------+---------+-----------+----------+--------------+ CFV      Full           Yes      Yes                                 +---------+---------------+---------+-----------+----------+--------------+ SFJ      Full                                                        +---------+---------------+---------+-----------+----------+--------------+ FV Prox  Full                                                        +---------+---------------+---------+-----------+----------+--------------+ FV Mid   Full                                                        +---------+---------------+---------+-----------+----------+--------------+ FV DistalFull                                                        +---------+---------------+---------+-----------+----------+--------------+ PFV      Full                                                        +---------+---------------+---------+-----------+----------+--------------+ POP      Full           Yes      Yes                                 +---------+---------------+---------+-----------+----------+--------------+  PTV      Full                                                        +---------+---------------+---------+-----------+----------+--------------+ PERO     Full                                                        +---------+---------------+---------+-----------+----------+--------------+     Summary: BILATERAL: - No evidence of deep vein thrombosis seen in the lower extremities, bilaterally. - No evidence of superficial venous thrombosis in the lower extremities, bilaterally. -No evidence of popliteal cyst, bilaterally.   *See table(s) above for measurements  and observations. Electronically signed by Servando Snare MD on 06/13/2020 at 4:42:40 PM.    Final     Cardiovascular Studies   Echocardiogram 06/12/2020: Impressions:  1. Left ventricular ejection fraction, by estimation, is 60 to 65%. The  left ventricle has normal function. The left ventricle has no regional  wall motion abnormalities. Left ventricular diastolic parameters are  consistent with Grade II diastolic  dysfunction (pseudonormalization). Elevated left ventricular end-diastolic  pressure.   2. Right ventricular systolic function is mildly reduced. The right  ventricular size is mildly enlarged. There is severely elevated pulmonary  artery systolic pressure.   3. Left atrial size was moderately dilated.   4. Right atrial size was moderately dilated.   5. The mitral valve is abnormal. Trivial mitral valve regurgitation. No  evidence of mitral stenosis.   6. Tricuspid valve regurgitation is moderate.   7. Post 21 mm Medtronic Freestyle valve normal functioning . The aortic  valve has been repaired/replaced. Aortic valve regurgitation is not  visualized. No aortic stenosis is present.   8. The inferior vena cava is dilated in size with >50% respiratory  variability, suggesting right atrial pressure of 8 mmHg. _______________  Lower Extremity Dopplers 06/13/2020: Summary: BILATERAL:  - No evidence of deep vein thrombosis seen in the lower extremities,  bilaterally.  - No evidence of superficial venous thrombosis in the lower extremities,  bilaterally.  -No evidence of popliteal cyst, bilaterally.   Patient Profile   Ms. Wurtz is a 87 y.o. 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  Assessment & Plan   New Onset Atrial Flutter with RVR  - Converted back to sinus rhythm with IV Amiodarone.  - Electrolytes were normal. TSH in 04/2020 was normal. - Echo showed normal LV function. - Initially transitioned to PO Amiodarone but this was stopped on 1/27 due to underlying pulmonary condition and concern for possible Amiodarone toxicity with worsening shortness of breath. - Maintaining sinus rhythm. Rates in the 40's to 60's (in the 40's overnight while sleeping). - Will decrease Lopressor to 67m twice daily with hold parameters. May need to discontinue all together. - CHA2DS2-VASc = 6 (CHF, mild carotid artery disease, HTN, age x2, female) and she was  started on Eliquis (low dose given weight <60kg and age). Will discharge on     Acute on Chronic Diastolic CHF  - Likely secondary to atrial flutter with RVR.  - BNP elevated in the 360's.  - Chest x-ray showed stable emphysematous lung disease but no overt edema.  - Echo showed LVEF of 60-65% with normal wall motion, grade 2 diastolic dysfunction, and elevated LVEDP. RV mildly enlarged with mildly reduced systolic function and severely elevated PASP. Also showed moderate TR and trivial MR.  - She received 2 dose of IV Lasix on admission and had a rise in creatinine so this was stopped. She received another dose yesterday due to worsening respiratory status with 1.3L of urinary output. Renal function stable. - She looks euvolemic on exam so will hold on additional diuresis at this time. Suspect current respiratory issues are due to pulmonary issues rather than CHF. - Continue to monitor volume status closely.  Acute Hypoxic and Hypercarbic Respiratory Failure COPD Exacerbation - Patient had worsening shortness of breath yesterday. We tried to get a V/Q scan to rule out PE given new severe pulmonary hypertension but patient was unable to lay flat for scan due to shortness of breath. Repeat chest  x-ray yesterday showed showed extensive chronic lung disease compatible with emphysema, small bilateral pleural effusions, and mild diffuse edema concerning for CHF. Also showed hazy opacities within the left upper lobe which may represent asymmetric alveolar edema vs infection.  - [facility] was consulted and felt like she also had component of COPD exacerbation. PO Prednisone was switched to IV Solu-Medrol. VBG was also ordered and showed pH of 7.248, pCO2 90.7, pO2 50.4, Bicarb 38.2. She was started on BiPAP. - Repeat ABG this morning showed pH of 7.4, pCO2 64.9, pO2 84.3, BiCarb 39.6.  - Patient states she feels she is breathing better today. - Management per [facility].  Severe Pulmonary Hypertension - PASP of 69.5 mmHg on Echo this admission, up from 29.8 mmHg in 2021.  - D-dimer elevated at 1.38. - V/Q scan was ordered and attempted yesterday but patient was unable to lay flat due to shortness of breath.  - Patient breathing better today but is still unsure if she will be able to lay flat.  Prolonged QTc - Patient had multiple EKGs yesterday with QTc ranging from 480's to 530's ms. Amiodarone discontinued yesterday. - Will recheck EKG today.    History of Aortic Stenosis s/p Bioprosthetic AVR  - History of AVR in 2012. Stable with normal function on Echo this admission.     Hypertension  - BP well controlled.    Hyperlipidemia  - Continue home Simvastatin 34m daily.     Hypothyroidism  - TSH normal on 04/24/2020. Continue home Synthroid.     AKI  - Creatinine 1.20 on admission and peaked at 1.95 after diuresis. Down to 1.45 today. Recent baseline around 1.1 to 1.3.  - Continue to monitor closely.    Thrombocytopenia  - Platelets 110 on admission and 105 today. Platelets were 174 in 04/2020. - Management per [facility].  Lung Cancer - Patient states she has received 5 radiation session. - [facility] talked with Oncology yesterday who recommended brain MRI to rule out brain mets. This was not  ordered yesterday due to breathing condition. May be able to get today.  For questions or updates, please contact COremPlease consult [email or web address] for contact info under        Signed, CDarreld Mclean PA-C  06/14/2020, 7:43 AM  PT seen and examined  I agree with findings as noted above by Virgie Dad   Seen yesterday by Ashok Norris Pt's medical issues are extremely complicated Currently she is comfortable in bed   Had VQ scan earlier today   Last night per family and nursing she did well with BiPAP   She did diurese some yesterday  ON exam, laying flat in bed Neck:  JVP is minimlally elevated  Lungs are rel clear to auscul   Moving air Cardiac exam:  RRR  N oS3  No signif murmurs Ext with Tr edema  Pt maintaining SR   Lopressor cut back to 25 bid   Hopefullly she can toelrate this   Continue Eliquis Volume is up mildly  Will reassess renal function in am Will look into BiPAP for home (pt is follwed in pulmonary V/Q scan results pending Appreciate assistance from hospitalist service  Dorris Carnes MD

## 2020-06-14 NOTE — Progress Notes (Signed)
   Patient had 14 beat run of non-sustained. She does report intermittent palpitations. Potassium normal this morning. Will check Magnesium. Continue beta-blocker.   Patient also wanted to discussed code status. She is currently DNI. However, she also tells me that she does not want chest compressions or defibrillation/cardioversion. She is OK with NIPPV/BiPAP and ACLS medications. Will update code status.  We suspect patient will be discharged this weekend. Currently being treated for COPD exacerbation. She missed appointment with Dr. Lamonte Sakai yesterday due to be hospitalized. We would like patient to have close follow-up with Pulmonology. Asked patient if she was okay with me calling Dr. Agustina Caroli office to schedule follow-up and she said that was fine. I got patient a follow-up visit with Rexene Edison, NP, next Thursday 06/20/2020 at 3:00pm.  Darreld Mclean, PA-C 06/14/2020 1:51 PM

## 2020-06-14 NOTE — Progress Notes (Signed)
   06/13/20 1925  Assess: MEWS Score  Pulse Rate (!) 44  ECG Heart Rate (!) 44  Resp 16  Level of Consciousness Responds to Voice  SpO2 95 %  Assess: MEWS Score  MEWS Temp 0  MEWS Systolic 0  MEWS Pulse 1  MEWS RR 0  MEWS LOC 1  MEWS Score 2  MEWS Score Color Yellow  Assess: if the MEWS score is Yellow or Red  Were vital signs taken at a resting state? Yes  Focused Assessment No change from prior assessment  Early Detection of Sepsis Score *See Row Information* Low  MEWS guidelines implemented *See Row Information* Yes  Treat  MEWS Interventions Escalated (See documentation below)  Pain Scale 0-10  Pain Score 0  Take Vital Signs  Increase Vital Sign Frequency  Yellow: Q 2hr X 2 then Q 4hr X 2, if remains yellow, continue Q 4hrs  Escalate  MEWS: Escalate Yellow: discuss with charge nurse/RN and consider discussing with provider and RRT  Notify: Charge Nurse/RN  Name of Charge Nurse/RN Notified Geologist, engineering  Date Charge Nurse/RN Notified 06/13/20  Time Charge Nurse/RN Notified 2000

## 2020-06-14 NOTE — Progress Notes (Signed)
PROGRESS NOTE    JAJAIRA RUIS  FXT:[id number] DOB: [birth year] DOA: 06/09/2020 PCP: [name], PA-C    Brief Narrative:  [name] is an 87 year old female with past medical history significant for COPD, GOLD stage III, chronic hypoxic respiratory failure on 2 L nasal cannula baseline, bronchogenic carcinoma, HLD, essential hypertension, paroxysmal atrial fibrillation, severe protein calorie malnutrition, hypothyroidism who initially presented to the ED with new onset atrial fibrillation with RVR.  Patient was admitted by the cardiology service, and started on heparin drip which was eventually transitioned to Eliquis.  Patient currently rate controlled on metoprolol succinate 25 mg p.o. daily.  Hospital service consulted on 06/13/2020 for progressing shortness of breath and worsening oxygen status.   Assessment & Plan:   Principal Problem:   Atrial flutter with rapid ventricular response (HCC) Active Problems:   Chronic obstructive pulmonary disease (HCC)   Hypertension   S/P aortic valve replacement and aortoplasty   Hypothyroidism; on synthroid   Hyperlipidemia LDL goal <70   Acute on chronic diastolic CHF (congestive heart failure) (HCC)   Acute on chronic hypoxic/hypercapnic respiratory failure Acute COPD exacerbation; GOLD III Patient follows with pulmonology outpatient, Dr. Lamonte Sakai. On Stiolto 2 puffs once daily and prednisone 10 mg p.o. daily at home.  Utilizes home nebulizer machine as needed.  On 3 L nasal cannula at baseline.  Patient was noted to have increased work of breathing with shortness of breath and increased oxygen demand on 06/13/2020.  ABG notable for pH 7.24 with a PaCO2 of 90.7; and patient was placed on BiPAP.  D-dimer elevated 1.38, but unable to tolerate nuclear medicine scan; and CTA contraindicated given renal dysfunction. --Repeat AG BG this a.m. with pH 7.40, PaCO2 64.9; improved, now on 5 L nasal cannula --Continue BiPAP nightly --Solu-Medrol 40  mg IV q6h, will taper to q8h today; continue taper to home dose oral steriod --Continue scheduled Xopenex nebs --Brovana nebs twice daily --Incruse Elipta 1 puff daily --Continue supplemental oxygen, maintain SPO2 greater than 88%  Acute on chronic diastolic congestive heart failure TTE with LVEF 29-92%, grade 2 diastolic dysfunction, LA/RA moderately dilated, moderate TR, severely elevated PA systolic pressure, IVC dilated.  Patient received 2 doses of IV Lasix on admission which was held given bump of her creatinine. --net negative 1.3L past 24h and net negative 1.8L since admission --wt 52.4>53.1kg --Metoprolol succinate 25 mg p.o. daily --Further per cardiology --Strict I's and O's --Daily weights  Atrial fibrillation with RVR Essential hypertension BP 110/52, well controlled.   --Metoprolol succinate 25 mg p.o. daily --Eliquis 2.5 mg twice daily for anticoagulation --Monitor on telemetry  Acute on chronic CKD stage3b Baseline creatinine 1.2-1.3 with GFR 36. --Cr 1.20>>1.95>1.82>1.58>1.45 --Cardiology holding further diuresis at this time --Avoid nephrotoxins, renally dose all medications --Repeat BMP in a.m.  Prolonged QTC --Amiodarone discontinued by cardiology --Continue to monitor on telemetry --Avoid QTC prolonging medications  Aortic stenosis s/p bioprosthetic AVR Underwent repair in 2012, TTE shows valve functioning within normal limits.  Hypothyroidism TSH 0.984 on 04/24/2020 --Levothyroxine 112 mcg p.o. daily  Hyperlipidemia: Simvastatin 20 mg p.o. nightly  Right lung bronchogenic carcinoma Nuclear med PET scan 02/22/2020 with large hypermetabolic nodule superior segment right lower lobe consistent with bronchogenic carcinoma, no evidence of mediastinal nodal metastasis, small hypermetabolic nodule left upper lobe.  Unable to perform bronchoscopic biopsy due to severity of her poorly controlled COPD. --Case was discussed with oncology, requested MR brain to  rule out metastases, patient unable to tolerate yesterday; she states does  not know if she can lay flat for prolonged period of time today --Continue outpatient follow-up with pulmonology and oncology  Severe protein calorie malnutrition Body mass index is 20.08 kg/m. Nutrition Status: Nutrition Problem: Increased nutrient needs Etiology: chronic illness (COPD, CHF) Signs/Symptoms: estimated needs Interventions: Ensure Enlive (each supplement provides 350kcal and 20 grams of protein),MVI --Continue to encourage increased oral intake  Weakness/deconditioning --PT/OT evaluation for discharge needs  On behalf of [facility] hospital service, we appreciate the ability to participate in the care of this patient.  We will continue to follow along with you.  DVT prophylaxis: Eliquis   Code Status: Partial Code Family Communication: Updated patient extensively at bedside  Disposition Plan:  Level of care: Telemetry Cardiac Status is: Inpatient  Procedures:   TTE  Antimicrobials:   None   Subjective: Patient seen and examined bedside, resting comfortably.  Transition from BiPAP to nasal cannula overnight.  Patient states breathing improved and reports lower extremity edema much improved since initial presentation.  No specific complaints or concerns at this time.  Discussed with patient regarding elevated CO2 levels on ABG; and likely would benefit from home use trilogy machine; she seemed to be interested in trying.  Denies headache, no visual changes, no chest pain, palpitations, no cough/congestion, no fever/chills/night sweats, no nausea/vomiting/diarrhea, no abdominal pain.  No acute events overnight per nurse staff.  Objective: Vitals:   06/14/20 0300 06/14/20 0416 06/14/20 1200 06/14/20 1348  BP: (!) 150/70 (!) 110/52  118/73  Pulse: (!) 47 (!) 48 (!) 47 72  Resp: 17 (!) 21  18  Temp: (!) 97.2 F (36.2 C) 97.8 F (36.6 C)  98.7 F (37.1 C)  TempSrc: Axillary Oral  Axillary   SpO2: 98% 97%  96%  Weight:  53.1 kg    Height:        Intake/Output Summary (Last 24 hours) at 06/14/2020 1407 Last data filed at 06/14/2020 0900 Gross per 24 hour  Intake 360 ml  Output 1300 ml  Net -940 ml   Filed Weights   06/09/20 2240 06/13/20 0500 06/14/20 0416  Weight: 52.4 kg 52.4 kg 53.1 kg    Examination:  General exam: Appears calm and comfortable, thin/cachectic; chronically ill in appearance Respiratory system: Slightly decreased breath sounds bilateral bases, mild late expiratory wheezing lower lung fields, normal respiratory effort without accessory muscle use, on 5 L nasal cannula (3L baseline) Cardiovascular system: S1 & S2 heard, irregularly irregular rhythm, normal rate. No JVD, murmurs, rubs, gallops or clicks.  Trace lower extremity edema. Gastrointestinal system: Abdomen is nondistended, soft and nontender. No organomegaly or masses felt. Normal bowel sounds heard. Central nervous system: Alert and oriented. No focal neurological deficits. Extremities: Symmetric 5 x 5 power. Skin: No rashes, lesions or ulcers Psychiatry: Judgement and insight appear normal. Mood & affect appropriate.     Data Reviewed: I have personally reviewed following labs and imaging studies  CBC: Recent Labs  Lab 06/09/20 1216 06/09/20 1309 06/10/20 0049 06/11/20 0311 06/14/20 0411  WBC  --  9.4 6.7 8.8 3.6*  NEUTROABS  --  8.6*  --   --   --   HGB 11.9* 13.2 13.4 13.2 12.8  HCT 35.0* 40.2 42.2 41.9 40.9  MCV  --  100.8* 101.4* 102.4* 103.0*  PLT  --  110* 111* 110* 765*   Basic Metabolic Panel: Recent Labs  Lab 06/09/20 1207 06/09/20 1216 06/10/20 0049 06/10/20 1132 06/11/20 0311 06/12/20 0936 06/13/20 0643 06/14/20 0411  NA  --    < >  141 136 133* 137 136 135  K  --    < > 3.0* 4.5 4.0 4.0 3.9 4.6  CL  --    < > 93* 92* 86* 90* 89* 88*  CO2  --    < > 36* 33* 33* 38* 37* 37*  GLUCOSE  --    < > 102* 183* 123* 217* 126* 151*  BUN  --    < > 25* 32* 34* 41*  36* 33*  CREATININE  --    < > 1.80* 1.76* 1.95* 1.82* 1.58* 1.45*  CALCIUM  --    < > 9.7 9.5 9.8 10.2 10.3 10.2  MG 1.9  --  2.2  --  2.4  --   --   --    < > = values in this interval not displayed.   GFR: Estimated Creatinine Clearance: 22.5 mL/min (A) (by C-G formula based on SCr of 1.45 mg/dL (H)). Liver Function Tests: No results for input(s): AST, ALT, ALKPHOS, BILITOT, PROT, ALBUMIN in the last 168 hours. No results for input(s): LIPASE, AMYLASE in the last 168 hours. No results for input(s): AMMONIA in the last 168 hours. Coagulation Profile: No results for input(s): INR, PROTIME in the last 168 hours. Cardiac Enzymes: No results for input(s): CKTOTAL, CKMB, CKMBINDEX, TROPONINI in the last 168 hours. BNP (last 3 results) No results for input(s): PROBNP in the last 8760 hours. HbA1C: No results for input(s): HGBA1C in the last 72 hours. CBG: No results for input(s): GLUCAP in the last 168 hours. Lipid Profile: No results for input(s): CHOL, HDL, LDLCALC, TRIG, CHOLHDL, LDLDIRECT in the last 72 hours. Thyroid Function Tests: No results for input(s): TSH, T4TOTAL, FREET4, T3FREE, THYROIDAB in the last 72 hours. Anemia Panel: No results for input(s): VITAMINB12, FOLATE, FERRITIN, TIBC, IRON, RETICCTPCT in the last 72 hours. Sepsis Labs: No results for input(s): PROCALCITON, LATICACIDVEN in the last 168 hours.  Recent Results (from the past 240 hour(s))  SARS CORONAVIRUS 2 (TAT 6-24 HRS) Nasopharyngeal Nasopharyngeal Swab     Status: None   Collection Time: 06/09/20  1:09 PM   Specimen: Nasopharyngeal Swab  Result Value Ref Range Status   SARS Coronavirus 2 NEGATIVE NEGATIVE Final    Comment: (NOTE) SARS-CoV-2 target nucleic acids are NOT DETECTED.  The SARS-CoV-2 RNA is generally detectable in upper and lower respiratory specimens during the acute phase of infection. Negative results do not preclude SARS-CoV-2 infection, do not rule out co-infections with other  pathogens, and should not be used as the sole basis for treatment or other patient management decisions. Negative results must be combined with clinical observations, patient history, and epidemiological information. The expected result is Negative.  Fact Sheet for Patients: SugarRoll.be  Fact Sheet for Healthcare Providers: https://www.woods-mathews.com/  This test is not yet approved or cleared by the [place] FDA and  has been authorized for detection and/or diagnosis of SARS-CoV-2 by FDA under an Emergency Use Authorization (EUA). This EUA will remain  in effect (meaning this test can be used) for the duration of the COVID-19 declaration under Se ction 564(b)(1) of the Act, 21 U.S.C. section 360bbb-3(b)(1), unless the authorization is terminated or revoked sooner.  Performed at [facility], [place] [address], [place], [place] [address]          Radiology Studies: DG CHEST PORT 1 VIEW  Result Date: 06/13/2020 CLINICAL DATA:  Shortness of breath. EXAM: PORTABLE CHEST 1 VIEW COMPARISON:  06/09/2020 FINDINGS: Previous median sternotomy. Stable cardiac  enlargement. Extensive chronic lung disease is identified bilaterally compatible with emphysema. There are small bilateral pleural effusions and mild diffuse edema concerning for CHF. Hazy opacities within the left upper lobe may represent an area of asymmetric alveolar edema versus infection. IMPRESSION: 1. Congestive heart failure. 2. Hazy opacities within the left upper lobe may represent asymmetric alveolar edema versus infection. Electronically Signed   By: Kerby Moors M.D.   On: 06/13/2020 09:43   ECHOCARDIOGRAM COMPLETE  Result Date: 06/12/2020    ECHOCARDIOGRAM REPORT   Patient Name:   AIREN DALES Date of Exam: 06/12/2020 Medical Rec #:  [id number]     Height:       64.0 in Accession #:    [id number]    Weight:       115.5 lb Date of Birth:  05-Mar-1930    BSA:          1.549  m Patient Age:    32 years      BP:           121/72 mmHg Patient Gender: F             HR:           62 bpm. Exam Location:  Inpatient Procedure: 2D Echo, Color Doppler and Cardiac Doppler Indications:     I48.91* Unspecified atrial fibrillation  History:         Patient has prior history of Echocardiogram examinations, most                  recent 06/25/2019. Arrythmias:Atrial Fibrillation; Risk                  Factors:Hypertension and Dyslipidemia.  Sonographer:     Raquel Sarna Senior RDCS Referring Phys:  [id number] RHONDA G BARRETT Diagnosing Phys: Jenkins Rouge MD IMPRESSIONS  1. Left ventricular ejection fraction, by estimation, is 60 to 65%. The left ventricle has normal function. The left ventricle has no regional wall motion abnormalities. Left ventricular diastolic parameters are consistent with Grade II diastolic dysfunction (pseudonormalization). Elevated left ventricular end-diastolic pressure.  2. Right ventricular systolic function is mildly reduced. The right ventricular size is mildly enlarged. There is severely elevated pulmonary artery systolic pressure.  3. Left atrial size was moderately dilated.  4. Right atrial size was moderately dilated.  5. The mitral valve is abnormal. Trivial mitral valve regurgitation. No evidence of mitral stenosis.  6. Tricuspid valve regurgitation is moderate.  7. Post 21 mm Medtronic Freestyle valve normal functioning . The aortic valve has been repaired/replaced. Aortic valve regurgitation is not visualized. No aortic stenosis is present.  8. The inferior vena cava is dilated in size with >50% respiratory variability, suggesting right atrial pressure of 8 mmHg. FINDINGS  Left Ventricle: Left ventricular ejection fraction, by estimation, is 60 to 65%. The left ventricle has normal function. The left ventricle has no regional wall motion abnormalities. The left ventricular internal cavity size was normal in size. There is  no left ventricular hypertrophy. Left ventricular  diastolic parameters are consistent with Grade II diastolic dysfunction (pseudonormalization). Elevated left ventricular end-diastolic pressure. Right Ventricle: The right ventricular size is mildly enlarged. No increase in right ventricular wall thickness. Right ventricular systolic function is mildly reduced. There is severely elevated pulmonary artery systolic pressure. The tricuspid regurgitant velocity is 3.69 m/s, and with an assumed right atrial pressure of 15 mmHg, the estimated right ventricular systolic pressure is 35.7 mmHg. Left Atrium: Left atrial size was moderately dilated. Right  Atrium: Right atrial size was moderately dilated. Pericardium: There is no evidence of pericardial effusion. Mitral Valve: The mitral valve is abnormal. There is mild thickening of the mitral valve leaflet(s). There is mild calcification of the mitral valve leaflet(s). Mild mitral annular calcification. Trivial mitral valve regurgitation. No evidence of mitral valve stenosis. MV peak gradient, 14.8 mmHg. The mean mitral valve gradient is 4.0 mmHg. Tricuspid Valve: The tricuspid valve is normal in structure. Tricuspid valve regurgitation is moderate . No evidence of tricuspid stenosis. Aortic Valve: Post 21 mm Medtronic Freestyle valve normal functioning. The aortic valve has been repaired/replaced. Aortic valve regurgitation is not visualized. No aortic stenosis is present. Aortic valve mean gradient measures 6.0 mmHg. Aortic valve peak gradient measures 10.5 mmHg. Aortic valve area, by VTI measures 2.19 cm. Pulmonic Valve: The pulmonic valve was normal in structure. Pulmonic valve regurgitation is not visualized. No evidence of pulmonic stenosis. Aorta: The aortic root is normal in size and structure. Venous: The inferior vena cava is dilated in size with greater than 50% respiratory variability, suggesting right atrial pressure of 8 mmHg. IAS/Shunts: No atrial level shunt detected by color flow Doppler.  LEFT VENTRICLE  PLAX 2D LVIDd:         3.20 cm  Diastology LVIDs:         2.30 cm  LV e' medial:    6.31 cm/s LV PW:         1.10 cm  LV E/e' medial:  29.5 LV IVS:        1.00 cm  LV e' lateral:   5.98 cm/s LVOT diam:     2.00 cm  LV E/e' lateral: 31.1 LV SV:         81 LV SV Index:   52 LVOT Area:     3.14 cm  RIGHT VENTRICLE RV S prime:     10.20 cm/s TAPSE (M-mode): 1.8 cm LEFT ATRIUM             Index       RIGHT ATRIUM           Index LA diam:        4.50 cm 2.90 cm/m  RA Area:     26.70 cm LA Vol (A2C):   80.3 ml 51.83 ml/m RA Volume:   96.70 ml  62.41 ml/m LA Vol (A4C):   82.9 ml 53.51 ml/m LA Biplane Vol: 84.1 ml 54.28 ml/m  AORTIC VALVE AV Area (Vmax):    2.21 cm AV Area (Vmean):   2.31 cm AV Area (VTI):     2.19 cm AV Vmax:           162.00 cm/s AV Vmean:          111.000 cm/s AV VTI:            0.368 m AV Peak Grad:      10.5 mmHg AV Mean Grad:      6.0 mmHg LVOT Vmax:         114.00 cm/s LVOT Vmean:        81.500 cm/s LVOT VTI:          0.257 m LVOT/AV VTI ratio: 0.70  AORTA Ao Root diam: 3.10 cm MITRAL VALVE                TRICUSPID VALVE MV Area (PHT): 3.43 cm     TR Peak grad:   54.5 mmHg MV Area VTI:   1.57 cm  TR Vmax:        369.00 cm/s MV Peak grad:  14.8 mmHg MV Mean grad:  4.0 mmHg     SHUNTS MV Vmax:       1.92 m/s     Systemic VTI:  0.26 m MV Vmean:      87.1 cm/s    Systemic Diam: 2.00 cm MV Decel Time: 221 msec MV E velocity: 186.00 cm/s MV A velocity: 62.60 cm/s MV E/A ratio:  2.97 Jenkins Rouge MD Electronically signed by Jenkins Rouge MD Signature Date/Time: 06/12/2020/2:39:27 PM    Final (Updated)    VAS [place] LOWER EXTREMITY VENOUS (DVT)  Result Date: 06/13/2020  Lower Venous DVT Study Indications: Swelling, and Edema.  Comparison Study: no prior Performing Technologist: Abram Sander RVS  Examination Guidelines: A complete evaluation includes B-mode imaging, spectral Doppler, color Doppler, and power Doppler as needed of all accessible portions of each vessel. Bilateral testing is  considered an integral part of a complete examination. Limited examinations for reoccurring indications may be performed as noted. The reflux portion of the exam is performed with the patient in reverse Trendelenburg.  +---------+---------------+---------+-----------+----------+--------------+ RIGHT    CompressibilityPhasicitySpontaneityPropertiesThrombus Aging +---------+---------------+---------+-----------+----------+--------------+ CFV      Full           Yes      Yes                                 +---------+---------------+---------+-----------+----------+--------------+ SFJ      Full                                                        +---------+---------------+---------+-----------+----------+--------------+ FV Prox  Full                                                        +---------+---------------+---------+-----------+----------+--------------+ FV Mid   Full                                                        +---------+---------------+---------+-----------+----------+--------------+ FV DistalFull                                                        +---------+---------------+---------+-----------+----------+--------------+ PFV      Full                                                        +---------+---------------+---------+-----------+----------+--------------+ POP      Full           Yes      Yes                                 +---------+---------------+---------+-----------+----------+--------------+  PTV      Full                                                        +---------+---------------+---------+-----------+----------+--------------+ PERO     Full                                                        +---------+---------------+---------+-----------+----------+--------------+   +---------+---------------+---------+-----------+----------+--------------+ LEFT      CompressibilityPhasicitySpontaneityPropertiesThrombus Aging +---------+---------------+---------+-----------+----------+--------------+ CFV      Full           Yes      Yes                                 +---------+---------------+---------+-----------+----------+--------------+ SFJ      Full                                                        +---------+---------------+---------+-----------+----------+--------------+ FV Prox  Full                                                        +---------+---------------+---------+-----------+----------+--------------+ FV Mid   Full                                                        +---------+---------------+---------+-----------+----------+--------------+ FV DistalFull                                                        +---------+---------------+---------+-----------+----------+--------------+ PFV      Full                                                        +---------+---------------+---------+-----------+----------+--------------+ POP      Full           Yes      Yes                                 +---------+---------------+---------+-----------+----------+--------------+ PTV      Full                                                        +---------+---------------+---------+-----------+----------+--------------+  PERO     Full                                                        +---------+---------------+---------+-----------+----------+--------------+     Summary: BILATERAL: - No evidence of deep vein thrombosis seen in the lower extremities, bilaterally. - No evidence of superficial venous thrombosis in the lower extremities, bilaterally. -No evidence of popliteal cyst, bilaterally.   *See table(s) above for measurements and observations. Electronically signed by Servando Snare MD on 06/13/2020 at 4:42:40 PM.    Final         Scheduled Meds: . apixaban  2.5 mg Oral BID  .  arformoterol  15 mcg Nebulization BID   And  . umeclidinium bromide  1 puff Inhalation Daily  . feeding supplement  237 mL Oral TID BM  . levalbuterol  1.25 mg Nebulization Q6H  . levothyroxine  112 mcg Oral Daily  . methylPREDNISolone (SOLU-MEDROL) injection  40 mg Intravenous Q6H  . metoprolol succinate  25 mg Oral Daily  . multivitamin with minerals  1 tablet Oral Daily  . simvastatin  20 mg Oral QHS   Continuous Infusions: . sodium chloride       LOS: 4 days    Time spent: 38 minutes spent on chart review, discussion with nursing staff, consultants, updating family and interview/physical exam; more than 50% of that time was spent in counseling and/or coordination of care.    Annette Liotta J [place] DO [facility] Available via Epic secure chat 7am-7pm After these hours, please refer to coverage provider listed on [email or web address] 06/14/2020, 2:07 PM

## 2020-06-15 DIAGNOSIS — J432 Centrilobular emphysema: Secondary | ICD-10-CM

## 2020-06-15 DIAGNOSIS — I4892 Unspecified atrial flutter: Secondary | ICD-10-CM | POA: Diagnosis not present

## 2020-06-15 DIAGNOSIS — I272 Pulmonary hypertension, unspecified: Secondary | ICD-10-CM

## 2020-06-15 DIAGNOSIS — C3490 Malignant neoplasm of unspecified part of unspecified bronchus or lung: Secondary | ICD-10-CM

## 2020-06-15 DIAGNOSIS — J9620 Acute and chronic respiratory failure, unspecified whether with hypoxia or hypercapnia: Secondary | ICD-10-CM

## 2020-06-15 DIAGNOSIS — Z7901 Long term (current) use of anticoagulants: Secondary | ICD-10-CM

## 2020-06-15 DIAGNOSIS — Z952 Presence of prosthetic heart valve: Secondary | ICD-10-CM

## 2020-06-15 DIAGNOSIS — N179 Acute kidney failure, unspecified: Secondary | ICD-10-CM

## 2020-06-15 DIAGNOSIS — E038 Other specified hypothyroidism: Secondary | ICD-10-CM

## 2020-06-15 LAB — BASIC METABOLIC PANEL
Anion gap: 8 (ref 5–15)
BUN: 36 mg/dL — ABNORMAL HIGH (ref 8–23)
CO2: 36 mmol/L — ABNORMAL HIGH (ref 22–32)
Calcium: 9.8 mg/dL (ref 8.9–10.3)
Chloride: 93 mmol/L — ABNORMAL LOW (ref 98–111)
Creatinine, Ser: 1.36 mg/dL — ABNORMAL HIGH (ref 0.44–1.00)
GFR, Estimated: 37 mL/min — ABNORMAL LOW (ref >60)
Glucose, Bld: 135 mg/dL — ABNORMAL HIGH (ref 70–99)
Potassium: 5 mmol/L (ref 3.5–5.1)
Sodium: 137 mmol/L (ref 135–145)

## 2020-06-15 MED ORDER — PREDNISONE 10 MG PO TABS: ORAL_TABLET | ORAL | 1 refills | Status: DC

## 2020-06-15 MED ORDER — LEVALBUTEROL HCL 0.63 MG/3ML IN NEBU
INHALATION_SOLUTION | RESPIRATORY_TRACT | Status: AC
  Administered 2020-06-15: 0.63 mg
  Filled 2020-06-15: qty 3

## 2020-06-15 MED ORDER — LEVALBUTEROL HCL 1.25 MG/0.5ML IN NEBU
1.2500 mg | INHALATION_SOLUTION | Freq: Three times a day (TID) | RESPIRATORY_TRACT | Status: DC
  Administered 2020-06-15: 1.25 mg via RESPIRATORY_TRACT
  Filled 2020-06-15 (×4): qty 0.5

## 2020-06-15 MED ORDER — METOPROLOL SUCCINATE ER 25 MG PO TB24: 25.0000 mg | ORAL_TABLET | Freq: Every day | ORAL | 3 refills | Status: DC

## 2020-06-15 MED ORDER — PREDNISONE 10 MG (21) PO TBPK: ORAL_TABLET | ORAL | 0 refills | Status: DC

## 2020-06-15 MED ORDER — APIXABAN 2.5 MG PO TABS: 2.5000 mg | ORAL_TABLET | Freq: Two times a day (BID) | ORAL | 3 refills | Status: DC

## 2020-06-15 NOTE — Discharge Summary (Addendum)
Discharge Summary    Patient ID: [name] MRN: [id number]; DOB: 1929/10/11  Admit date: 06/09/2020 Discharge date: 06/15/2020  Primary Care Provider: [name], PA-C  Primary Cardiologist: Sinclair Grooms, MD  Primary Electrophysiologist:  None   Discharge Diagnoses    Principal Problem:   Atrial flutter with rapid ventricular response [facility]) Active Problems:   Chronic obstructive pulmonary disease ([place])   Hypertension   S/P aortic valve replacement and aortoplasty   Hypothyroidism; on synthroid   Hyperlipidemia LDL goal <70   Acute on chronic diastolic CHF (congestive heart failure) (H[facility])   Acute on chronic respiratory failure ([place])   Pulmonary hypertension, unspecified ([place])   AKI (acute kidney injury) ([place])   Lung cancer ([place])   Chronic anticoagulation    Diagnostic Studies/Procedures    Echo 06/12/20: 1. Left ventricular ejection fraction, by estimation, is 60 to 65%. The  left ventricle has normal function. The left ventricle has no regional  wall motion abnormalities. Left ventricular diastolic parameters are  consistent with Grade II diastolic  dysfunction (pseudonormalization). Elevated left ventricular end-diastolic  pressure.  2. Right ventricular systolic function is mildly reduced. The right  ventricular size is mildly enlarged. There is severely elevated pulmonary  artery systolic pressure.  3. Left atrial size was moderately dilated.  4. Right atrial size was moderately dilated.  5. The mitral valve is abnormal. Trivial mitral valve regurgitation. No  evidence of mitral stenosis.  6. Tricuspid valve regurgitation is moderate.  7. Post 21 mm Medtronic Freestyle valve normal functioning . The aortic  valve has been repaired/replaced. Aortic valve regurgitation is not  visualized. No aortic stenosis is present.  8. The inferior vena cava is dilated in size with >50% respiratory  variability, suggesting right atrial pressure of 8 mmHg.   _____________   Lower Extremity Dopplers 06/13/2020: Summary: BILATERAL:  - No evidence of deep vein thrombosis seen in the lower extremities,  bilaterally.  - No evidence of superficial venous thrombosis in the lower extremities,  bilaterally.  -No evidence of popliteal cyst, bilaterally.   History of Present Illness    [name] is a 87 y.o. 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  She reports progressing edema and SOB over the last several days. She denied palpitations.   WBC 9.4 Hgb 13.2 Plt 110 BNP 367 Mg 1.9 Cr 1.2 hstrop 28, delta pending COVID pending CXR stable emphysmatous changes  EKG aflutter with RVR  06/2019 echo LVEF 60-65%, indeterminate DDx, normal LA  Reports recent COVID contact, test is pending here.   She was admitted to cardiology with new onset atrial flutter with RVR.  Hospital Course     Consultants: none  Atrial flutter with RVR - new diagnosis She was started on IV amiodarone. Heparin drip transitioned to DOAC - 2.5 mg eliquis BID for age and weight.  TEE/DCCV was planned, but she converted to sinus rhythm.  IV amiodarone transitioned to PO.  She was doing well in sinus rhythm on PO amiodarone, but this was stopped due to her underlying pulmonary condition and possible amiodarone toxicity. If she reverted back to atrial flutter, restart PO amiodarone. Started on 25 mg toprol.    Need for chronic  anticoagulation This patients CHA2DS2-VASc Score and unadjusted Ischemic Stroke Rate (% per year) is equal to 4.8 % stroke rate/year from a score of 4 (4age, female, HTN, carotid artery disease) She is on eliquis 2.5 mg BID. She is  doing well without bleeding.   Acute hypoxic and hypercarbic respiratory failure due to COPD exacerbation IM consulted and initiated solu-mdedrol. Pt is maintained on stiolto and prednisone daily at home. She has ambulated and maintained 93% O2 saturation on 2-3L  - is on 3 L at baseline. Resume home medications.    Acute on chronic diastolic heart failure Suspect exacerbation likely due to RVR. Repeat echo revealed normal EF 60-65%, grade 2 DD, moderately dilated left and right atria, moderate TR, and no AS. She was diuresed with 40 mg IV lasix x 3 doses.  Resume home lasix of 20 mg daily.    Pulmonary hypertension Moderately reduced RV function VQ scan was recommended, but patient has significant SOB in supine position with anxiety. SOB has resolved with diuresis and solumedrol. Suspect dyspnea due to the above, lower suspicion for PE.    Hypertension Home 74m imdur and 2.5 mg amlodipine held. May restart at OV if pressure is elevated.   AKI Resolving. Discharge sCr 1.36, down from peak of 1.95.   Lung cancer Oncology on board.    Disposition Pt was weaned to her home O2. PT was consulted and recommended home health PT 3 times weekly. She ambulated with 2-3 L O2.    Did the patient have an acute coronary syndrome (MI, NSTEMI, STEMI, etc) this admission?:  No                               Did the patient have a percutaneous coronary intervention (stent / angioplasty)?:  No.       _____________  Discharge Vitals Blood pressure (!) 153/69, pulse 86, temperature 98.5 F (36.9 C), temperature source Oral, resp. rate 13, height _0  (1.626 m), weight 53.1 kg, SpO2 97 %.  Filed Weights   06/09/20 2240 06/13/20 0500 06/14/20 0416  Weight: 52.4 kg 52.4 kg 53.1 kg    Labs & Radiologic Studies    CBC Recent Labs    06/14/20 0411  WBC 3.6*  HGB 12.8  HCT 40.9  MCV 103.0*  PLT 1454   Basic Metabolic Panel Recent Labs    06/14/20 0411 06/14/20 1449  06/15/20 0250  NA 135  --  137  K 4.6  --  5.0  CL 88*  --  93*  CO2 37*  --  36*  GLUCOSE 151*  --  135*  BUN 33*  --  36*  CREATININE 1.45*  --  1.36*  CALCIUM 10.2  --  9.8  MG  --  2.3  --    Liver Function Tests No results for input(s): AST, ALT, ALKPHOS, BILITOT, PROT, ALBUMIN in the last 72 hours. No results for input(s): LIPASE, AMYLASE in the last 72 hours. High Sensitivity Troponin:   Recent Labs  Lab 06/09/20 1207  TROPONINIHS 28*    BNP Invalid input(s): POCBNP D-Dimer Recent Labs    06/13/20 1426  DDIMER 1.38*   Hemoglobin A1C No results for input(s): HGBA1C in the last 72 hours. Fasting Lipid Panel No results for input(s): CHOL, HDL, LDLCALC, TRIG, CHOLHDL, LDLDIRECT in the last 72 hours. Thyroid Function Tests No results for input(s): TSH, T4TOTAL, T3FREE, THYROIDAB in the last 72 hours.  Invalid input(s): FREET3 _____________  DG CHEST PORT 1 VIEW  Result Date: 06/13/2020 CLINICAL DATA:  Shortness of breath. EXAM: PORTABLE CHEST 1 VIEW COMPARISON:  06/09/2020 FINDINGS: Previous median sternotomy. Stable cardiac enlargement. Extensive chronic lung disease is identified bilaterally compatible with emphysema. There are small bilateral pleural effusions and mild diffuse edema concerning for CHF. Hazy opacities within the left upper lobe may represent an area of asymmetric alveolar edema versus infection. IMPRESSION: 1. Congestive heart failure. 2. Hazy opacities within the left upper lobe may represent asymmetric alveolar edema versus infection. Electronically Signed   By: Kerby Moors M.D.   On: 06/13/2020 09:43   DG Chest Port 1 View  Result Date: 06/09/2020 CLINICAL DATA:  Worsening shortness of breath. History COPD and prior lung carcinoma. EXAM: PORTABLE CHEST 1 VIEW COMPARISON:  11/30/2019 FINDINGS: Stable heart size. Stable evidence of emphysematous lung disease with bilateral hyperinflation and scattered parenchymal scarring, most prominently at the  right lung base. Prominence of central pulmonary arteries is suggestive of underlying pulmonary hypertension. No pneumothorax, pulmonary edema or significant pleural effusions. IMPRESSION: Stable emphysematous lung disease and probable pulmonary hypertension. Electronically Signed   By: Aletta Edouard M.D.   On: 06/09/2020 13:15   ECHOCARDIOGRAM COMPLETE  Result Date: 06/12/2020    ECHOCARDIOGRAM REPORT   Patient Name:   [name] Date of Exam: 06/12/2020 Medical Rec #:  [id number]     Height:       64.0 in Accession #:    [id number]    Weight:       115.5 lb Date of Birth:  March 31, 1930    BSA:          1.549 m Patient Age:    49 years      BP:           121/72 mmHg Patient Gender: F             HR:           62 bpm. Exam Location:  Inpatient Procedure: 2D Echo, Color Doppler and Cardiac Doppler Indications:     I48.91* Unspecified atrial fibrillation  History:         Patient has prior history of Echocardiogram examinations, most                  recent 06/25/2019. Arrythmias:Atrial Fibrillation; Risk                  Factors:Hypertension and Dyslipidemia.  Sonographer:     Raquel Sarna Senior RDCS Referring Phys:  [id number] RHONDA G BARRETT Diagnosing Phys: Jenkins Rouge MD IMPRESSIONS  1. Left ventricular ejection fraction, by estimation, is 60 to 65%. The left ventricle has normal function. The left ventricle has no regional wall motion abnormalities. Left ventricular diastolic parameters are consistent with Grade II diastolic dysfunction (pseudonormalization). Elevated left ventricular end-diastolic pressure.  2. Right ventricular systolic function is mildly reduced. The right ventricular size is mildly enlarged. There is severely elevated pulmonary artery systolic pressure.  3. Left atrial size was moderately dilated.  4. Right atrial size was moderately dilated.  5. The mitral valve is abnormal. Trivial mitral valve regurgitation. No evidence of mitral stenosis.  6. Tricuspid valve regurgitation is moderate.  7. Post  21 mm Medtronic Freestyle valve normal functioning . The aortic valve has been repaired/replaced. Aortic valve regurgitation is not visualized. No aortic stenosis is present.  8. The inferior vena cava is dilated in size with >50% respiratory variability, suggesting right atrial pressure  of 8 mmHg. FINDINGS  Left Ventricle: Left ventricular ejection fraction, by estimation, is 60 to 65%. The left ventricle has normal function. The left ventricle has no regional wall motion abnormalities. The left ventricular internal cavity size was normal in size. There is  no left ventricular hypertrophy. Left ventricular diastolic parameters are consistent with Grade II diastolic dysfunction (pseudonormalization). Elevated left ventricular end-diastolic pressure. Right Ventricle: The right ventricular size is mildly enlarged. No increase in right ventricular wall thickness. Right ventricular systolic function is mildly reduced. There is severely elevated pulmonary artery systolic pressure. The tricuspid regurgitant velocity is 3.69 m/s, and with an assumed right atrial pressure of 15 mmHg, the estimated right ventricular systolic pressure is 17.4 mmHg. Left Atrium: Left atrial size was moderately dilated. Right Atrium: Right atrial size was moderately dilated. Pericardium: There is no evidence of pericardial effusion. Mitral Valve: The mitral valve is abnormal. There is mild thickening of the mitral valve leaflet(s). There is mild calcification of the mitral valve leaflet(s). Mild mitral annular calcification. Trivial mitral valve regurgitation. No evidence of mitral valve stenosis. MV peak gradient, 14.8 mmHg. The mean mitral valve gradient is 4.0 mmHg. Tricuspid Valve: The tricuspid valve is normal in structure. Tricuspid valve regurgitation is moderate . No evidence of tricuspid stenosis. Aortic Valve: Post 21 mm Medtronic Freestyle valve normal functioning. The aortic valve has been repaired/replaced. Aortic valve  regurgitation is not visualized. No aortic stenosis is present. Aortic valve mean gradient measures 6.0 mmHg. Aortic valve peak gradient measures 10.5 mmHg. Aortic valve area, by VTI measures 2.19 cm. Pulmonic Valve: The pulmonic valve was normal in structure. Pulmonic valve regurgitation is not visualized. No evidence of pulmonic stenosis. Aorta: The aortic root is normal in size and structure. Venous: The inferior vena cava is dilated in size with greater than 50% respiratory variability, suggesting right atrial pressure of 8 mmHg. IAS/Shunts: No atrial level shunt detected by color flow Doppler.  LEFT VENTRICLE PLAX 2D LVIDd:         3.20 cm  Diastology LVIDs:         2.30 cm  LV e' medial:    6.31 cm/s LV PW:         1.10 cm  LV E/e' medial:  29.5 LV IVS:        1.00 cm  LV e' lateral:   5.98 cm/s LVOT diam:     2.00 cm  LV E/e' lateral: 31.1 LV SV:         81 LV SV Index:   52 LVOT Area:     3.14 cm  RIGHT VENTRICLE RV S prime:     10.20 cm/s TAPSE (M-mode): 1.8 cm LEFT ATRIUM             Index       RIGHT ATRIUM           Index LA diam:        4.50 cm 2.90 cm/m  RA Area:     26.70 cm LA Vol (A2C):   80.3 ml 51.83 ml/m RA Volume:   96.70 ml  62.41 ml/m LA Vol (A4C):   82.9 ml 53.51 ml/m LA Biplane Vol: 84.1 ml 54.28 ml/m  AORTIC VALVE AV Area (Vmax):    2.21 cm AV Area (Vmean):   2.31 cm AV Area (VTI):     2.19 cm AV Vmax:           162.00 cm/s AV Vmean:  111.000 cm/s AV VTI:            0.368 m AV Peak Grad:      10.5 mmHg AV Mean Grad:      6.0 mmHg LVOT Vmax:         114.00 cm/s LVOT Vmean:        81.500 cm/s LVOT VTI:          0.257 m LVOT/AV VTI ratio: 0.70  AORTA Ao Root diam: 3.10 cm MITRAL VALVE                TRICUSPID VALVE MV Area (PHT): 3.43 cm     TR Peak grad:   54.5 mmHg MV Area VTI:   1.57 cm     TR Vmax:        369.00 cm/s MV Peak grad:  14.8 mmHg MV Mean grad:  4.0 mmHg     SHUNTS MV Vmax:       1.92 m/s     Systemic VTI:  0.26 m MV Vmean:      87.1 cm/s    Systemic Diam:  2.00 cm MV Decel Time: 221 msec MV E velocity: 186.00 cm/s MV A velocity: 62.60 cm/s MV E/A ratio:  2.97 Jenkins Rouge MD Electronically signed by Jenkins Rouge MD Signature Date/Time: 06/12/2020/2:39:27 PM    Final (Updated)    VAS [place] LOWER EXTREMITY VENOUS (DVT)  Result Date: 06/13/2020  Lower Venous DVT Study Indications: Swelling, and Edema.  Comparison Study: no prior Performing Technologist: Abram Sander RVS  Examination Guidelines: A complete evaluation includes B-mode imaging, spectral Doppler, color Doppler, and power Doppler as needed of all accessible portions of each vessel. Bilateral testing is considered an integral part of a complete examination. Limited examinations for reoccurring indications may be performed as noted. The reflux portion of the exam is performed with the patient in reverse Trendelenburg.  +---------+---------------+---------+-----------+----------+--------------+ RIGHT    CompressibilityPhasicitySpontaneityPropertiesThrombus Aging +---------+---------------+---------+-----------+----------+--------------+ CFV      Full           Yes      Yes                                 +---------+---------------+---------+-----------+----------+--------------+ SFJ      Full                                                        +---------+---------------+---------+-----------+----------+--------------+ FV Prox  Full                                                        +---------+---------------+---------+-----------+----------+--------------+ FV Mid   Full                                                        +---------+---------------+---------+-----------+----------+--------------+ FV DistalFull                                                        +---------+---------------+---------+-----------+----------+--------------+  PFV      Full                                                         +---------+---------------+---------+-----------+----------+--------------+ POP      Full           Yes      Yes                                 +---------+---------------+---------+-----------+----------+--------------+ PTV      Full                                                        +---------+---------------+---------+-----------+----------+--------------+ PERO     Full                                                        +---------+---------------+---------+-----------+----------+--------------+   +---------+---------------+---------+-----------+----------+--------------+ LEFT     CompressibilityPhasicitySpontaneityPropertiesThrombus Aging +---------+---------------+---------+-----------+----------+--------------+ CFV      Full           Yes      Yes                                 +---------+---------------+---------+-----------+----------+--------------+ SFJ      Full                                                        +---------+---------------+---------+-----------+----------+--------------+ FV Prox  Full                                                        +---------+---------------+---------+-----------+----------+--------------+ FV Mid   Full                                                        +---------+---------------+---------+-----------+----------+--------------+ FV DistalFull                                                        +---------+---------------+---------+-----------+----------+--------------+ PFV      Full                                                        +---------+---------------+---------+-----------+----------+--------------+  POP      Full           Yes      Yes                                 +---------+---------------+---------+-----------+----------+--------------+ PTV      Full                                                         +---------+---------------+---------+-----------+----------+--------------+ PERO     Full                                                        +---------+---------------+---------+-----------+----------+--------------+     Summary: BILATERAL: - No evidence of deep vein thrombosis seen in the lower extremities, bilaterally. - No evidence of superficial venous thrombosis in the lower extremities, bilaterally. -No evidence of popliteal cyst, bilaterally.   *See table(s) above for measurements and observations. Electronically signed by Servando Snare MD on 06/13/2020 at 4:42:40 PM.    Final    Disposition   Pt is being discharged home today in good condition.  Follow-up Plans & Appointments     Follow-up Information    Parrett, Fonnie Mu, NP Follow up.   Specialty: Pulmonary Disease Why: Hospital follow-up with Pulmonology scheduled for 06/20/2020 at 3:00pm. Contact information: [place] [address] [phone]        Richardson Dopp T, PA-C Follow up.   Specialties: Cardiology, Physician Assistant Why: Hospital follow-up with Cardiology scheduled for 06/25/2020 at 9:15am with Richardson Dopp, one of Dr. Thompson Caul PAs. Please arrive 15 minutes early for check-in. If this date/time does not work for you, please call our office to reschedule. Contact information: [address] [place] [address] [phone]              Discharge Instructions    Diet - low sodium heart healthy   Complete by: As directed    Discharge instructions   Complete by: As directed    I have prescribed a prednisone taper for 3 days, then resume your home dose of prednisone.   I have stopped your amlodipine and imdur for now. You may need to restart these if your pressure becomes elevated. We Valisa Karpel evaluate at follow up. Please call our office if you notice your BP consistently higher than 140-90s.  We recommend seeing your PCP in the next 7-10 days.   Should you  experience COVID-19-like symptoms, please consider testing. May use a home rapid test.   Increase activity slowly   Complete by: As directed       Discharge Medications   Allergies as of 06/15/2020      Reactions   Clarithromycin Other (See Comments)   Made the mouth/throat sore   Tamsulosin Itching   Trazodone And Nefazodone Other (See Comments)   Dizziness   Penicillins Itching, Rash   Did it involve swelling of the face/tongue/throat, SOB, or low BP? No Did it involve sudden or severe rash/hives, skin peeling, or any reaction on the inside of your mouth  or nose? Yes Did you need to seek medical attention at a hospital or doctor's office? Yes When did it last happen?Young woman  If all above answers are "NO", may proceed with cephalosporin use.      Medication List    STOP taking these medications   amLODipine 2.5 MG tablet Commonly known as: NORVASC   aspirin EC 81 MG tablet   doxycycline 100 MG tablet Commonly known as: VIBRA-TABS   isosorbide mononitrate 30 MG 24 hr tablet Commonly known as: IMDUR     TAKE these medications   acetaminophen 500 MG tablet Commonly known as: TYLENOL Take 500 mg by mouth every 6 (six) hours as needed. Take 3 tablets every 6 hours   albuterol 108 (90 Base) MCG/ACT inhaler Commonly known as: ProAir HFA Inhale 2 puffs into the lungs every 6 (six) hours as needed for wheezing or shortness of breath.   amitriptyline 10 MG tablet Commonly known as: ELAVIL Take 1 tablet (10 mg total) by mouth at bedtime.   apixaban 2.5 MG Tabs tablet Commonly known as: ELIQUIS Take 1 tablet (2.5 mg total) by mouth 2 (two) times daily.   Fish Oil 1000 MG Caps Take 1,000 mg by mouth daily.   furosemide 20 MG tablet Commonly known as: LASIX Take 1 tablet (20 mg total) by mouth 2 (two) times daily.   levothyroxine 112 MCG tablet Commonly known as: SYNTHROID Take 1 tablet by mouth once daily What changed: when to take this   Melatonin 10  MG Tabs Take 10 mg by mouth at bedtime.   metoprolol succinate 25 MG 24 hr tablet Commonly known as: TOPROL-XL Take 1 tablet (25 mg total) by mouth daily. Start taking on: June 16, 2020   OXYGEN Inhale 2-3 L/min into the lungs See admin instructions. Inhale 2 L/min into the lungs continuously and 3 L/min with any exertion   predniSONE 10 MG (21) Tbpk tablet Commonly known as: STERAPRED UNI-PAK 21 TAB Take 40 mg (4 tablets) for 1 day, then 30 mg (3 tablets) for 1 day, then 20 mg (2 tablets) for one day, then resume home dose of 10 mg daily. What changed: You were already taking a medication with the same name, and this prescription was added. Make sure you understand how and when to take each.   predniSONE 10 MG tablet Commonly known as: DELTASONE Resume following 3 day taper. What changed: See the new instructions.   simvastatin 20 MG tablet Commonly known as: ZOCOR Take 1 tablet (20 mg total) by mouth at bedtime.   Stiolto Respimat 2.5-2.5 MCG/ACT Aers Generic drug: Tiotropium Bromide-Olodaterol INHALE 2 PUFFS BY MOUTH ONCE DAILY. PLEASE SEE DR FOR REFILLS. What changed: See the new instructions.   VITAMIN D-3 PO Take 1 capsule by mouth daily.          Outstanding Labs/Studies   Monitor BP  Duration of Discharge Encounter   Greater than 30 minutes including physician time.  Signed, Tami Lin Duke, PA 06/15/2020, 2:01 PM    I have seen and examined this patient with Angie Duke.  Agree with above, note added to reflect my findings.  On exam, RRR, no murmurs.  Patient admitted to the hospital with new onset atrial flutter and shortness of breath.  She developed diastolic heart failure requiring diuresis.  She also developed a COPD exacerbation and was put on steroids.  We Craven Crean plan for discharge today with outpatient follow-up.  She Eadie Repetto require a steroid taper.  Tiphanie Vo M. Tyyne Cliett  MD 06/15/2020 2:23 PM

## 2020-06-15 NOTE — TOC Transition Note (Signed)
Transition of Care [facility]) - CM/SW Discharge Note   Patient Details  Name: [name] MRN: [id number] Date of Birth: [birth year]  Transition of Care [facility]) CM/SW Contact:  Harriet Masson, RN Phone Number:[phone] 06/15/2020, 2:16 PM   Clinical Narrative:    Spoke with pt today concerning recommendation for HHPT. Pt receptive to any agency-[facility] called spoke with [name] who accepted services for PT. Pt made aware and RNCM left message with pt's son [name]. No other request at this time. Pt to be discharged home. Pt states she has sufficient transportation services to get to all her medical appointments and pick up for her medications ordered. So [name] will be transporting today.  TOC team remains available to assist further with any discharge needs.   Final next level of care: [place] Barriers to Discharge: No Barriers Identified   Patient Goals and CMS Choice        Discharge Placement                  Name of family member notified: [name] message left-also spoke with the pt Patient and family notified of of transfer: 06/15/20  Discharge Plan and [place]: [place] Date [place]: 06/15/20 Time [place]: [id number] Representative spoke with at [place]: [place] Determinants of Health ([place]) Interventions     Readmission Risk Interventions No flowsheet data found.

## 2020-06-15 NOTE — Progress Notes (Signed)
Patient stated that she does not need to wear the BIPAP at night, RT tried to explain to patient the reason why she needs to wear it but patient declined.

## 2020-06-15 NOTE — Progress Notes (Signed)
[facility]  PROGRESS NOTE  [name] QQV:[id number] DOB: Jun 26, 1929 DOA: 06/09/2020 PCP: [name], PA-C   Brief HPI:   87 year old female with past medical history of COPD, Gold stage III, chronic hypoxemic respiratory failure on 2 L/min of oxygen nasal cannula at baseline, bronchogenic carcinoma, hyperlipidemia, essential hypertension, paroxysmal atrial fibrillation, severe protein calorie malnutrition, hypothyroidism who presented to ED with new onset A. fib with RVR.  Patient was mated to the cardiology service and started on heparin drip which was eventually transitioned to Eliquis.  Patient's rate is controlled on metoprolol.  Hospital service was consulted on 06/13/2020 for progressive shortness of breath and worsening oxygen status.    Subjective   Patient seen and examined, she has significantly improved, currently back to baseline at 2 L/min of oxygen.  Did not require BiPAP last night.   Assessment/Plan:     1. Acute on chronic hypoxemic respiratory failure-patient presented A. fib with RVR, developed worsening shortness of breath.  ABG showed pH 7.24, PCO2 of 90.7.  Patient was placed on BiPAP.  D-dimer is elevated at 1.38 but she was unable to tolerate VQ scan and CT is contraindicated given renal dysfunction.  Repeat ABG showed pH of 7.40, PCO2 65.9 which improved.  Patient is currently on 2 L/min of oxygen by nasal cannula.  She is off BiPAP.  She is on Solu-Medrol 40 mg IV every 8 hours.  Patient is stable for discharge home.  She can be discharged home tapering dose of prednisone 40 mg daily for 1 day, 30 mg daily for 1 day, 20 mg daily for 1 day, and then continue taking prednisone 10 mg daily at home dose.  Continue supplemental oxygen 2 L/min.  Patient follows pulmonology as outpatient.  She can follow-up pulmonology.  Continue Stiolto 2 puffs once daily and prednisone as above. 2. Acute on chronic  diastolic CHF-TTE showed LVEF of 60 to 33%, grade 2 diastolic  dysfunction.  She was started on Lasix as per cardiology.  Continue metoprolol 25 daily.  Recommend per cardiology. 3. Atrial fibrillation with RVR-cardiology following, patient heart rate is controlled metoprolol, she is on Eliquis for anticoagulation. 4. Acute on chronic CKD stage IIIb-baseline creatinine 1.2-1.3 GFR 36.  Diuretics per cardiology. 5. Prolonged QTC-amiodarone was discontinued by cardiology.  Avoid QTC prolonging medications. 6. Aortic stenosis s/p bioprosthetic AVR-she underwent repair with 2012, TTE showed valve functioning normal limits. 7. Hypothyroidism-TSH 0.84, continue levothyroxine. 8. Right lung bronchogenic carcinoma-Nuclear med PET scan 02/22/2020 with large hypermetabolic nodule superior segment right lower lobe consistent with bronchogenic carcinoma, no evidence of mediastinal nodal metastasis, small hypermetabolic nodule left upper lobe.  Unable to perform bronchoscopic biopsy due to severity of her poorly controlled COPD. --Case was discussed with oncology, requested MR brain to rule out metastases, patient unable to tolerate yesterday; she states does not know if she can lay flat for prolonged period of time today --Continue outpatient follow-up with pulmonology and oncolog     COVID-19 Labs  Recent Labs    06/13/20 1426  DDIMER 1.38*    Lab Results  Component Value Date   [place] NEGATIVE 06/09/2020   [place] NEGATIVE 06/25/2019     Scheduled medications:   . apixaban  2.5 mg Oral BID  . arformoterol  15 mcg Nebulization BID   And  . umeclidinium bromide  1 puff Inhalation Daily  . feeding supplement  237 mL Oral TID BM  . levalbuterol  1.25 mg Nebulization TID  . levothyroxine  112  mcg Oral Daily  . methylPREDNISolone (SOLU-MEDROL) injection  40 mg Intravenous Q8H  . metoprolol succinate  25 mg Oral Daily  . multivitamin with minerals  1 tablet Oral Daily  . simvastatin  20 mg Oral QHS         CBG: No results for input(s):  GLUCAP in the last 168 hours.  SpO2: 97 % O2 Flow Rate (L/min): 3 L/min FiO2 (%): 40 %    CBC: Recent Labs  Lab 06/09/20 1216 06/09/20 1309 06/10/20 0049 06/11/20 0311 06/14/20 0411  WBC  --  9.4 6.7 8.8 3.6*  NEUTROABS  --  8.6*  --   --   --   HGB 11.9* 13.2 13.4 13.2 12.8  HCT 35.0* 40.2 42.2 41.9 40.9  MCV  --  100.8* 101.4* 102.4* 103.0*  PLT  --  110* 111* 110* 105*    Basic Metabolic Panel: Recent Labs  Lab 06/09/20 1207 06/09/20 1216 06/10/20 0049 06/10/20 1132 06/11/20 0311 06/12/20 0936 06/13/20 0643 06/14/20 0411 06/14/20 1449 06/15/20 0250  NA  --    < > 141   < > 133* 137 136 135  --  137  K  --    < > 3.0*   < > 4.0 4.0 3.9 4.6  --  5.0  CL  --    < > 93*   < > 86* 90* 89* 88*  --  93*  CO2  --   --  36*   < > 33* 38* 37* 37*  --  36*  GLUCOSE  --    < > 102*   < > 123* 217* 126* 151*  --  135*  BUN  --    < > 25*   < > 34* 41* 36* 33*  --  36*  CREATININE  --    < > 1.80*   < > 1.95* 1.82* 1.58* 1.45*  --  1.36*  CALCIUM  --   --  9.7   < > 9.8 10.2 10.3 10.2  --  9.8  MG 1.9  --  2.2  --  2.4  --   --   --  2.3  --    < > = values in this interval not displayed.     Liver Function Tests: No results for input(s): AST, ALT, ALKPHOS, BILITOT, PROT, ALBUMIN in the last 168 hours.   Antibiotics: Anti-infectives (From admission, onward)   None       DVT prophylaxis: Apixaban  Code Status: Full code  Family Communication: No family at bedside     Objective   Vitals:   06/15/20 0242 06/15/20 0409 06/15/20 0911 06/15/20 1016  BP:  (!) 119/59  (!) 153/69  Pulse: 64 72  86  Resp: 15 13    Temp:  98.5 F (36.9 C)    TempSrc:  Oral    SpO2: 98% 96% 97%   Weight:      Height:        Intake/Output Summary (Last 24 hours) at 06/15/2020 1339 Last data filed at 06/15/2020 0500 Gross per 24 hour  Intake 360 ml  Output 800 ml  Net -440 ml    01/27 1901 - 01/29 0700 In: 720 [P.O.:720] Out: 2100 [Urine:2100]  Filed Weights    06/09/20 2240 06/13/20 0500 06/14/20 0416  Weight: 52.4 kg 52.4 kg 53.1 kg    Physical Examination:    General-appears in no acute distress  Heart-S1-S2, regular, no murmur auscultated  Lungs-clear to  auscultation bilaterally, no wheezing or crackles auscultated  Abdomen-soft, nontender, no organomegaly  Extremities-no edema in the lower extremities  Neuro-alert, oriented x3, no focal deficit noted       Data Reviewed:   Recent Results (from the past 240 hour(s))  SARS CORONAVIRUS 2 (TAT 6-24 HRS) Nasopharyngeal Nasopharyngeal Swab     Status: None   Collection Time: 06/09/20  1:09 PM   Specimen: Nasopharyngeal Swab  Result Value Ref Range Status   SARS Coronavirus 2 NEGATIVE NEGATIVE Final    Comment: (NOTE) SARS-CoV-2 target nucleic acids are NOT DETECTED.  The SARS-CoV-2 RNA is generally detectable in upper and lower respiratory specimens during the acute phase of infection. Negative results do not preclude SARS-CoV-2 infection, do not rule out co-infections with other pathogens, and should not be used as the sole basis for treatment or other patient management decisions. Negative results must be combined with clinical observations, patient history, and epidemiological information. The expected result is Negative.  Fact Sheet for Patients: SugarRoll.be  Fact Sheet for Healthcare Providers: https://www.woods-mathews.com/  This test is not yet approved or cleared by the [place] FDA and  has been authorized for detection and/or diagnosis of SARS-CoV-2 by FDA under an Emergency Use Authorization (EUA). This EUA will remain  in effect (meaning this test can be used) for the duration of the COVID-19 declaration under Se ction 564(b)(1) of the Act, 21 U.S.C. section 360bbb-3(b)(1), unless the authorization is terminated or revoked sooner.  Performed at [facility], [place] [address] [place], [place] [address]      No results for input(s): LIPASE, AMYLASE in the last 168 hours. No results for input(s): AMMONIA in the last 168 hours.  Cardiac Enzymes: No results for input(s): CKTOTAL, CKMB, CKMBINDEX, TROPONINI in the last 168 hours. BNP (last 3 results) Recent Labs    04/24/20 1541 05/22/20 1102 06/09/20 1207  BNP CANCELED 803.4* 367.1*    ProBNP (last 3 results) No results for input(s): PROBNP in the last 8760 hours.  Studies:  VAS [place] LOWER EXTREMITY VENOUS (DVT)  Result Date: 06/13/2020  Lower Venous DVT Study Indications: Swelling, and Edema.  Comparison Study: no prior Performing Technologist: Abram Sander RVS  Examination Guidelines: A complete evaluation includes B-mode imaging, spectral Doppler, color Doppler, and power Doppler as needed of all accessible portions of each vessel. Bilateral testing is considered an integral part of a complete examination. Limited examinations for reoccurring indications may be performed as noted. The reflux portion of the exam is performed with the patient in reverse Trendelenburg.  +---------+---------------+---------+-----------+----------+--------------+ RIGHT    CompressibilityPhasicitySpontaneityPropertiesThrombus Aging +---------+---------------+---------+-----------+----------+--------------+ CFV      Full           Yes      Yes                                 +---------+---------------+---------+-----------+----------+--------------+ SFJ      Full                                                        +---------+---------------+---------+-----------+----------+--------------+ FV Prox  Full                                                        +---------+---------------+---------+-----------+----------+--------------+  FV Mid   Full                                                        +---------+---------------+---------+-----------+----------+--------------+ FV DistalFull                                                         +---------+---------------+---------+-----------+----------+--------------+ PFV      Full                                                        +---------+---------------+---------+-----------+----------+--------------+ POP      Full           Yes      Yes                                 +---------+---------------+---------+-----------+----------+--------------+ PTV      Full                                                        +---------+---------------+---------+-----------+----------+--------------+ PERO     Full                                                        +---------+---------------+---------+-----------+----------+--------------+   +---------+---------------+---------+-----------+----------+--------------+ LEFT     CompressibilityPhasicitySpontaneityPropertiesThrombus Aging +---------+---------------+---------+-----------+----------+--------------+ CFV      Full           Yes      Yes                                 +---------+---------------+---------+-----------+----------+--------------+ SFJ      Full                                                        +---------+---------------+---------+-----------+----------+--------------+ FV Prox  Full                                                        +---------+---------------+---------+-----------+----------+--------------+ FV Mid   Full                                                        +---------+---------------+---------+-----------+----------+--------------+  FV DistalFull                                                        +---------+---------------+---------+-----------+----------+--------------+ PFV      Full                                                        +---------+---------------+---------+-----------+----------+--------------+ POP      Full           Yes      Yes                                  +---------+---------------+---------+-----------+----------+--------------+ PTV      Full                                                        +---------+---------------+---------+-----------+----------+--------------+ PERO     Full                                                        +---------+---------------+---------+-----------+----------+--------------+     Summary: BILATERAL: - No evidence of deep vein thrombosis seen in the lower extremities, bilaterally. - No evidence of superficial venous thrombosis in the lower extremities, bilaterally. -No evidence of popliteal cyst, bilaterally.   *See table(s) above for measurements and observations. Electronically signed by Servando Snare MD on 06/13/2020 at 4:42:40 PM.    Final        Oswald Hillock   [facility] If 7PM-7AM, please contact night-coverage at [email or web address], Office  [phone]   06/15/2020, 1:39 PM  LOS: 5 days

## 2020-06-15 NOTE — Evaluation (Addendum)
Occupational Therapy Evaluation Patient Details Name: [name] MRN: [id number] DOB: 07-02-1929 Today's Date: 06/15/2020    History of Present Illness [name] is an 87 year old female with past medical history significant for COPD, GOLD stage III, chronic hypoxic respiratory failure on 3 L nasal cannula baseline (2L sleeping at night,) bronchogenic carcinoma, HLD, essential hypertension, paroxysmal atrial fibrillation, severe protein calorie malnutrition, hypothyroidism. Admitted with  Atrial flutter with rapid ventricular response, acute on chronic CHF.   Clinical Impression   Patient admitted with the above diagnosis.  She is close to baseline for all in room mobility/toileting, and self care.  Her primary deficits is DOE, she fatigues very quickly.  Patient educated on lenght of O2 cord, addition of additional lengths, and that she needs to bump up her O2 1/2 liter for every additional length added, particularly during activities.  PT is recommending [facility].  Once [name] PT assess her, they can decide if she would benefit from any DME or EC/WS strategies to help with DOE.  No further OT as she is discharging today.         Follow Up Recommendations  HH PT can assess for any [facility] OT needs.      Equipment Recommendations  None recommended by OT    Recommendations for Other Services       Precautions / Restrictions Precautions Precautions: None Precaution Comments: DOE Restrictions Weight Bearing Restrictions: No      Mobility Bed Mobility Overal bed mobility: Modified Independent                  Transfers Overall transfer level: Needs assistance   Transfers: Sit to/from Stand;Stand Pivot Transfers Sit to Stand: Supervision Stand pivot transfers: Supervision       General transfer comment: reaching for objects in her envisronment.  fatigues quickly    Balance Overall balance assessment: Mild deficits observed, not formally tested                                          ADL either performed or assessed with clinical judgement   ADL Overall ADL's : At baseline                                       General ADL Comments: patient's primary deficit is DOE.  She is able to care for herself, she just needs better EC/WS techniques.  Frequent rest breaks for toileting and getting dressed for discharge.     Vision Baseline Vision/History: Wears glasses Wears Glasses: At all times Patient Visual Report: No change from baseline       Perception     Praxis      Pertinent Vitals/Pain Pain Assessment: No/denies pain     Hand Dominance Right   Extremity/Trunk Assessment Upper Extremity Assessment Upper Extremity Assessment: Overall WFL for tasks assessed   Lower Extremity Assessment Lower Extremity Assessment: Defer to PT evaluation   Cervical / Trunk Assessment Cervical / Trunk Assessment: Kyphotic   Communication Communication Communication: No difficulties   Cognition Arousal/Alertness: Awake/alert Behavior During Therapy: WFL for tasks assessed/performed Overall Cognitive Status: Within Functional Limits for tasks assessed  General Comments   O2 93% at conclusion with 4 L.  Nursing notified she is requesting breathing treatment.      Exercises     Shoulder Instructions      Home Living Family/patient expects to be discharged to:: Private residence Living Arrangements: Alone Available Help at Discharge: Family;Available PRN/intermittently Type of Home: House Home Access: Stairs to enter [employer] of Steps: 3 Entrance Stairs-Rails: Left Home Layout: One level     Bathroom Shower/Tub: International aid/development worker Accessibility: Yes How Accessible: Accessible via walker Home Equipment: [place] - 2 wheels;Shower seat          Prior Functioning/Environment Level of Independence: Independent with assistive device(s)         Comments: Able to care for herself and participate in light home mangement and meal prep.  Son assists with community mobility.  She handles her own bills and med management.        OT Problem List: Decreased activity tolerance;Impaired balance (sitting and/or standing)      OT Treatment/Interventions:      OT Goals(Current goals can be found in the care plan section) Acute Rehab OT Goals Patient Stated Goal: discharging home this date OT Goal Formulation: With patient Time For Goal Achievement: 06/15/20 Potential to Achieve Goals: Good  OT Frequency:     Barriers to D/C:            Co-evaluation              AM-PAC OT "6 Clicks" Daily Activity     Outcome Measure Help from another person eating meals?: None Help from another person taking care of personal grooming?: None Help from another person toileting, which includes using toliet, bedpan, or urinal?: A Little Help from another person bathing (including washing, rinsing, drying)?: A Little Help from another person to put on and taking off regular upper body clothing?: None Help from another person to put on and taking off regular lower body clothing?: A Little 6 Click Score: 21   End of Session Equipment Utilized During Treatment: Oxygen Nurse Communication: Other (comment) (patient requests breathing treatment)  Activity Tolerance: Patient limited by fatigue Patient left: in bed;with call bell/phone within reach  OT Visit Diagnosis: Unsteadiness on feet (R26.81)                Time: 0100-7121 OT Time Calculation (min): 21 min Charges:  OT General Charges $OT Visit: 1 Visit OT Evaluation $OT Eval Moderate Complexity: 1 Mod  06/15/2020  Rich, OTR/L  Acute Rehabilitation Services  Office:  [phone]   Metta Clines 06/15/2020, 3:59 PM

## 2020-06-15 NOTE — Progress Notes (Signed)
Progress Note  Patient Name: [name] Date of Encounter: 06/15/2020  [facility] Cardiologist: Sinclair Grooms, MD   Subjective   Currently feeling well.  She feels that her breathing is back to normal.  Despite that, she has not been out of bed or moving around much.  She did not wear BiPAP yesterday.    Inpatient Medications    Scheduled Meds: . apixaban  2.5 mg Oral BID  . arformoterol  15 mcg Nebulization BID   And  . umeclidinium bromide  1 puff Inhalation Daily  . feeding supplement  237 mL Oral TID BM  . levalbuterol      . levalbuterol  1.25 mg Nebulization TID  . levothyroxine  112 mcg Oral Daily  . methylPREDNISolone (SOLU-MEDROL) injection  40 mg Intravenous Q8H  . metoprolol succinate  25 mg Oral Daily  . multivitamin with minerals  1 tablet Oral Daily  . simvastatin  20 mg Oral QHS   Continuous Infusions: . sodium chloride     PRN Meds: acetaminophen, nitroGLYCERIN, ondansetron (ZOFRAN) IV, QUEtiapine   Vital Signs    Vitals:   06/14/20 1348 06/14/20 2131 06/15/20 0242 06/15/20 0409  BP: 118/73   (!) 119/59  Pulse: 72 67 64 72  Resp: _0 Temp: 98.7 F (37.1 C)   98.5 F (36.9 C)  TempSrc: Axillary   Oral  SpO2: 96% 99% 98% 96%  Weight:      Height:        Intake/Output Summary (Last 24 hours) at 06/15/2020 0853 Last data filed at 06/15/2020 0500 Gross per 24 hour  Intake 720 ml  Output 800 ml  Net -80 ml   Last 3 Weights 06/14/2020 06/13/2020 06/09/2020  Weight (lbs) 117 lb 115 lb 9.6 oz 115 lb 8.3 oz  Weight (kg) 53.071 kg 52.436 kg 52.4 kg      Telemetry    Sinus rhythm-personally reviewed  ECG    None new  Physical Exam   GEN: Well nourished, well developed, in no acute distress  HEENT: normal  Neck: no JVD, carotid bruits, or masses Cardiac: RRR; no murmurs, rubs, or gallops,no edema  Respiratory:  clear to auscultation bilaterally, normal work of breathing GI: soft, nontender, nondistended, + BS MS: no  deformity or atrophy  Skin: warm and dry Neuro:  Strength and sensation are intact Psych: euthymic mood, full affect   Labs    High Sensitivity Troponin:   Recent Labs  Lab 06/09/20 1207  TROPONINIHS 28*      Chemistry Recent Labs  Lab 06/13/20 0643 06/14/20 0411 06/15/20 0250  NA 136 135 137  K 3.9 4.6 5.0  CL 89* 88* 93*  CO2 37* 37* 36*  GLUCOSE 126* 151* 135*  BUN 36* 33* 36*  CREATININE 1.58* 1.45* 1.36*  CALCIUM 10.3 10.2 9.8  GFRNONAA 31* 35* 37*  ANIONGAP _1 Hematology Recent Labs  Lab 06/10/20 0049 06/11/20 0311 06/14/20 0411  WBC 6.7 8.8 3.6*  RBC 4.16 4.09 3.97  HGB 13.4 13.2 12.8  HCT 42.2 41.9 40.9  MCV 101.4* 102.4* 103.0*  MCH 32.2 32.3 32.2  MCHC 31.8 31.5 31.3  RDW 13.7 13.8 13.2  PLT 111* 110* 105*    BNP Recent Labs  Lab 06/09/20 1207  BNP 367.1*     DDimer  Recent Labs  Lab 06/13/20 1426  DDIMER 1.38*     Radiology    DG CHEST PORT  1 VIEW  Result Date: 06/13/2020 CLINICAL DATA:  Shortness of breath. EXAM: PORTABLE CHEST 1 VIEW COMPARISON:  06/09/2020 FINDINGS: Previous median sternotomy. Stable cardiac enlargement. Extensive chronic lung disease is identified bilaterally compatible with emphysema. There are small bilateral pleural effusions and mild diffuse edema concerning for CHF. Hazy opacities within the left upper lobe may represent an area of asymmetric alveolar edema versus infection. IMPRESSION: 1. Congestive heart failure. 2. Hazy opacities within the left upper lobe may represent asymmetric alveolar edema versus infection. Electronically Signed   By: Kerby Moors M.D.   On: 06/13/2020 09:43   VAS [place] LOWER EXTREMITY VENOUS (DVT)  Result Date: 06/13/2020  Lower Venous DVT Study Indications: Swelling, and Edema.  Comparison Study: no prior Performing Technologist: Abram Sander RVS  Examination Guidelines: A complete evaluation includes B-mode imaging, spectral Doppler, color Doppler, and power Doppler as  needed of all accessible portions of each vessel. Bilateral testing is considered an integral part of a complete examination. Limited examinations for reoccurring indications may be performed as noted. The reflux portion of the exam is performed with the patient in reverse Trendelenburg.  +---------+---------------+---------+-----------+----------+--------------+ RIGHT    CompressibilityPhasicitySpontaneityPropertiesThrombus Aging +---------+---------------+---------+-----------+----------+--------------+ CFV      Full           Yes      Yes                                 +---------+---------------+---------+-----------+----------+--------------+ SFJ      Full                                                        +---------+---------------+---------+-----------+----------+--------------+ FV Prox  Full                                                        +---------+---------------+---------+-----------+----------+--------------+ FV Mid   Full                                                        +---------+---------------+---------+-----------+----------+--------------+ FV DistalFull                                                        +---------+---------------+---------+-----------+----------+--------------+ PFV      Full                                                        +---------+---------------+---------+-----------+----------+--------------+ POP      Full           Yes      Yes                                 +---------+---------------+---------+-----------+----------+--------------+  PTV      Full                                                        +---------+---------------+---------+-----------+----------+--------------+ PERO     Full                                                        +---------+---------------+---------+-----------+----------+--------------+    +---------+---------------+---------+-----------+----------+--------------+ LEFT     CompressibilityPhasicitySpontaneityPropertiesThrombus Aging +---------+---------------+---------+-----------+----------+--------------+ CFV      Full           Yes      Yes                                 +---------+---------------+---------+-----------+----------+--------------+ SFJ      Full                                                        +---------+---------------+---------+-----------+----------+--------------+ FV Prox  Full                                                        +---------+---------------+---------+-----------+----------+--------------+ FV Mid   Full                                                        +---------+---------------+---------+-----------+----------+--------------+ FV DistalFull                                                        +---------+---------------+---------+-----------+----------+--------------+ PFV      Full                                                        +---------+---------------+---------+-----------+----------+--------------+ POP      Full           Yes      Yes                                 +---------+---------------+---------+-----------+----------+--------------+ PTV      Full                                                        +---------+---------------+---------+-----------+----------+--------------+  PERO     Full                                                        +---------+---------------+---------+-----------+----------+--------------+     Summary: BILATERAL: - No evidence of deep vein thrombosis seen in the lower extremities, bilaterally. - No evidence of superficial venous thrombosis in the lower extremities, bilaterally. -No evidence of popliteal cyst, bilaterally.   *See table(s) above for measurements and observations. Electronically signed by Servando Snare MD on 06/13/2020 at  4:42:40 PM.    Final     Cardiovascular Studies   Echocardiogram 06/12/2020: Impressions:  1. Left ventricular ejection fraction, by estimation, is 60 to 65%. The  left ventricle has normal function. The left ventricle has no regional  wall motion abnormalities. Left ventricular diastolic parameters are  consistent with Grade II diastolic  dysfunction (pseudonormalization). Elevated left ventricular end-diastolic  pressure.   2. Right ventricular systolic function is mildly reduced. The right  ventricular size is mildly enlarged. There is severely elevated pulmonary  artery systolic pressure.   3. Left atrial size was moderately dilated.   4. Right atrial size was moderately dilated.   5. The mitral valve is abnormal. Trivial mitral valve regurgitation. No  evidence of mitral stenosis.   6. Tricuspid valve regurgitation is moderate.   7. Post 21 mm Medtronic Freestyle valve normal functioning . The aortic  valve has been repaired/replaced. Aortic valve regurgitation is not  visualized. No aortic stenosis is present.   8. The inferior vena cava is dilated in size with >50% respiratory  variability, suggesting right atrial pressure of 8 mmHg. _______________  Lower Extremity Dopplers 06/13/2020: Summary: BILATERAL:  - No evidence of deep vein thrombosis seen in the lower extremities,  bilaterally.  - No evidence of superficial venous thrombosis in the lower extremities,  bilaterally.  -No evidence of popliteal cyst, bilaterally.   Patient Profile   Ms. Ronda is a 87 y.o. 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  Assessment & Plan   1.  Atrial flutter  with rapid rates: Converted back to sinus rhythm on amiodarone.  Was initially transitioned to p.o. amiodarone, but this was stopped due to her underlying pulmonary condition and possible amiodarone toxicity.  She has maintained sinus rhythm.  We Ebonee Stober continue Eliquis for a CHA2DS2-VASc of 6.  She does go back into atrial flutter, would restart amiodarone.  2.  Acute on chronic diastolic heart failure: Likely secondary to atrial flutter.  Respiratory status is back to normal.  No obvious volume overload. . 3.  Acute hypoxic and hypercarbic respiratory failure due to COPD exacerbation: Has been seen by hospitalist team.  Is currently on Solu-Medrol.  She Kaicee Scarpino likely be ready for discharge within the next few days.  Appreciate recommendations on discharge medications.  4.  Severe pulmonary hypertension: Was unable to lie flat previously.  Respiratory status back to normal.  Can be evaluated as an outpatient.  5.  Aortic stenosis status post bioprosthetic AVR: Stable on echo this admission.  Continue  to monitor.      6.  Hypertension: Currently well controlled     7.  Hyperlipidemia: Continue home Zocor Hypothyroidism     8.  Acute kidney injury: Creatinine has stabilized to around her baseline.  Continue to monitor.  9.  Thrombocytopenia: Platelets low at 105 yesterday.  Management per triad.  10.  Lung cancer: Oncology was consulted who recommended a brain MRI.  Plan per triad.  11.  Deconditioning: Plan to consult physical therapy.  For questions or updates, please contact [place] Please consult [email or web address] for contact info under        Signed, Destin Kittler Meredith Leeds, MD  06/15/2020, 8:53 AM

## 2020-06-15 NOTE — Evaluation (Signed)
Physical Therapy Evaluation Patient Details Name: [name] MRN: [id number] DOB: 07/25/1929 Today's Date: 06/15/2020   History of Present Illness  [name] is an 87 year old female with past medical history significant for COPD, GOLD stage III, chronic hypoxic respiratory failure on 3 L nasal cannula baseline (2L sleeping at night,) bronchogenic carcinoma, HLD, essential hypertension, paroxysmal atrial fibrillation, severe protein calorie malnutrition, hypothyroidism. Admitted with  Atrial flutter with rapid ventricular response, acute on chronic CHF.  Clinical Impression  Pt admitted with above diagnosis. Demonstrates ability to ambulate 100 ft maintained SpO2 >92% on 3 and 2L. Using RW this date for increased safety and stability, shows mild instability without this device. Moderate DOE, but states she feels that she is approaching baseline. (She was able to walk through grocery store on occasion with her son, otherwise independent at home PTA.) Pt currently with functional limitations due to the deficits listed below (see PT Problem List). Pt will benefit from skilled PT to increase their independence and safety with mobility to allow discharge to the venue listed below.       Follow Up Recommendations Home health PT;Supervision - Intermittent    Equipment Recommendations       Recommendations for Other Services       Precautions / Restrictions Precautions Precautions: None Precaution Comments:  (monitor O2) Restrictions Weight Bearing Restrictions: No      Mobility  Bed Mobility Overal bed mobility: Modified Independent             General bed mobility comments: extra time.    Transfers Overall transfer level: Needs assistance Equipment used: Rolling walker (2 wheeled) Transfers: Sit to/from [facility] Sit to Stand: Supervision Stand pivot transfers: Supervision       General transfer comment: supervision for safety. Cues for hand  placement, practiced x2 did not require physical assist from bed. Supervision with cues for sequencing, assist with lines/leads pivoting around to/from bed and bsc.  Ambulation/Gait Ambulation/Gait assistance: Supervision Gait Distance (Feet): 100 Feet Assistive device: Rolling walker (2 wheeled);None Gait Pattern/deviations: Step-through pattern;Decreased stride length Gait velocity: decreased   General Gait Details: Educated on safe DME use with RW for light support ambulating approx 85 feet today on 3L at 97% SpO2, titrated to 2L and SpO2 remained >92% during bout. Trialled short distance of 15 feet without RW and demonstrated mild but notable instability. Moderate DOE towards end of distance.  Stairs            Wheelchair Mobility    Modified Rankin (Stroke Patients Only)       Balance Overall balance assessment: Mild deficits observed, not formally tested                                           Pertinent Vitals/Pain Pain Assessment: No/denies pain    Home Living Family/patient expects to be discharged to:: Private residence Living Arrangements: Alone Available Help at Discharge: Family;Available PRN/intermittently Type of Home: House Home Access: Stairs to enter Entrance Stairs-Rails: Left Entrance Stairs-Number of Steps: 3 Home Layout: One level Home Equipment: Walker - 2 wheels      Prior Function Level of Independence: Independent         Comments: Occasionally walks throughout grocery store with son, however he does most of the shopping.     Hand Dominance   Dominant Hand: Right    Extremity/Trunk Assessment  Upper Extremity Assessment Upper Extremity Assessment: Defer to OT evaluation    Lower Extremity Assessment Lower Extremity Assessment: Generalized weakness       Communication   Communication: No difficulties  Cognition Arousal/Alertness: Awake/alert Behavior During Therapy: WFL for tasks  assessed/performed Overall Cognitive Status: Within Functional Limits for tasks assessed                                        General Comments General comments (skin integrity, edema, etc.): SpO2 on 1.5L when PT entered room and sustaining 97% SpO2, normal RR    Exercises     Assessment/Plan    PT Assessment Patient needs continued PT services  PT Problem List Decreased strength;Decreased activity tolerance;Decreased balance;Decreased mobility;Decreased knowledge of use of DME;Cardiopulmonary status limiting activity       PT Treatment Interventions DME instruction;Gait training;Stair training;Functional mobility training;Therapeutic exercise;Therapeutic activities;Patient/family education    PT Goals (Current goals can be found in the Care Plan section)  Acute Rehab PT Goals Patient Stated Goal: Go home soon PT Goal Formulation: With patient Time For Goal Achievement: 06/29/20 Potential to Achieve Goals: Good    Frequency Min 3X/week   Barriers to discharge Decreased caregiver support lives alone    Co-evaluation               AM-PAC PT "6 Clicks" Mobility  Outcome Measure Help needed turning from your back to your side while in a flat bed without using bedrails?: None Help needed moving from lying on your back to sitting on the side of a flat bed without using bedrails?: None Help needed moving to and from a bed to a chair (including a wheelchair)?: A Little Help needed standing up from a chair using your arms (e.g., wheelchair or bedside chair)?: A Little Help needed to walk in hospital room?: A Little Help needed climbing 3-5 steps with a railing? : A Little 6 Click Score: 20    End of Session Equipment Utilized During Treatment: Gait belt;Oxygen Activity Tolerance: Patient tolerated treatment well Patient left: with call bell/phone within reach;in chair   PT Visit Diagnosis: Other abnormalities of gait and mobility (R26.89);Muscle  weakness (generalized) (M62.81);Difficulty in walking, not elsewhere classified (R26.2)    Time: 1798-1025 PT Time Calculation (min) (ACUTE ONLY): 29 min   Charges:   PT Evaluation $PT Eval Moderate Complexity: 1 Mod PT Treatments $Gait Training: 8-22 mins        Elayne Snare, PT, DPT  Ellouise Newer 06/15/2020, 12:07 PM

## 2020-06-17 ENCOUNTER — Ambulatory Visit
Admission: RE | Admit: 2020-06-17 | Discharge: 2020-06-17 | Disposition: A | Discharge status: Home / Self Care | Payer: Medicare Other | Source: Ambulatory Visit | Attending: Radiation Oncology | Admitting: Radiation Oncology

## 2020-06-17 ENCOUNTER — Encounter: Payer: Self-pay | Admitting: Radiation Oncology

## 2020-06-17 ENCOUNTER — Other Ambulatory Visit: Payer: Self-pay

## 2020-06-17 VITALS — BP 139/54 | HR 51 | Temp 98.3°F | Resp 24 | Ht 64.0 in | Wt 117.8 lb

## 2020-06-17 DIAGNOSIS — Z923 Personal history of irradiation: Secondary | ICD-10-CM | POA: Diagnosis not present

## 2020-06-17 DIAGNOSIS — Z7901 Long term (current) use of anticoagulants: Secondary | ICD-10-CM | POA: Insufficient documentation

## 2020-06-17 DIAGNOSIS — R630 Anorexia: Secondary | ICD-10-CM | POA: Diagnosis not present

## 2020-06-17 DIAGNOSIS — R197 Diarrhea, unspecified: Secondary | ICD-10-CM | POA: Diagnosis not present

## 2020-06-17 DIAGNOSIS — Z7952 Long term (current) use of systemic steroids: Secondary | ICD-10-CM | POA: Diagnosis not present

## 2020-06-17 DIAGNOSIS — R911 Solitary pulmonary nodule: Secondary | ICD-10-CM | POA: Diagnosis not present

## 2020-06-17 DIAGNOSIS — Z79899 Other long term (current) drug therapy: Secondary | ICD-10-CM | POA: Diagnosis not present

## 2020-06-17 DIAGNOSIS — R0602 Shortness of breath: Secondary | ICD-10-CM | POA: Insufficient documentation

## 2020-06-17 DIAGNOSIS — R109 Unspecified abdominal pain: Secondary | ICD-10-CM | POA: Insufficient documentation

## 2020-06-17 NOTE — Progress Notes (Signed)
Patient is here today for 1 month follow up post radiation to pulmonary nodules.  Patient was hospitalized for a flutter and shortness of breath 01/23-01/29/2022.  Patient reports some mild abdominal pain from last night and states she took 3 tylenol and finally went to sleep.  Reports this is a new pain.  Patient does report having diarrhea.  Patient reports shortness of breath with any exertion.  She uses 3 liters of oxygen and goes up to 4 liters.  She states she doesn't have much of an appetite because they took her Salt away after this last hospitalization.  Poor energy.  Vitals:   06/17/20 1550  BP: (!) 139/54  Pulse: (!) 51  Resp: (!) 24  Temp: 98.3 F (36.8 C)  SpO2: 93%  Weight: 117 lb 12.8 oz (53.4 kg)  Height: _0  (1.626 m)  PF: (!) 3 L/min

## 2020-06-17 NOTE — Progress Notes (Signed)
Radiation Oncology         [phone] ________________________________  Name: [name] MRN: [id number]  Date: 06/17/2020  DOB: [birth year]  Follow-Up Visit Note  CC: [name], PA-C  Collene Gobble, MD    ICD-10-CM   1. Pulmonary nodule 1 cm or greater in diameter  R91.1 CT CHEST WO CONTRAST    Diagnosis: PET-avid right lower lobe lung nodule consistent with primary bronchogenic carcinoma; possible left upper lobe synchronous bronchogenic carcinoma  Interval Since Last Radiation: One month and two days  Radiation Treatment Dates: 05/02/2020 through 05/15/2020  Site / Technique / Total Dose (Gy) / Dose per Fx (Gy) / Completed Fx / Beam Energies Right lung; IMRT. A total of 60 Gy given at 12 Gy per fraction for a total of 5 fractions. 6XFFF beam energy.  Narrative:  The patient returns today for routine follow-up. Since the end of treatment, she was admitted to the hospital from 06/09/2020 - 06/15/2020 for atrial flutter with rapid ventricular response. No other significant interval history.  On review of systems, she reports mild abdominal pain last night, diarrhea, and shortness of breath with exertion. She also reports having a poor appetite because she was advised to decrease salt intake after hospitalization. Uses between 3-4L supplemental oxygen.           ALLERGIES:  is allergic to clarithromycin, tamsulosin, trazodone and nefazodone, and penicillins.  Meds: Current Outpatient Medications  Medication Sig Dispense Refill  . acetaminophen (TYLENOL) 500 MG tablet Take 500 mg by mouth every 6 (six) hours as needed. Take 3 tablets every 6 hours    . albuterol (PROAIR HFA) 108 (90 Base) MCG/ACT inhaler Inhale 2 puffs into the lungs every 6 (six) hours as needed for wheezing or shortness of breath. 18 g 5  . amitriptyline (ELAVIL) 10 MG tablet Take 1 tablet (10 mg total) by mouth at bedtime. 30 tablet 0  . apixaban (ELIQUIS) 2.5 MG TABS tablet Take 1 tablet (2.5 mg  total) by mouth 2 (two) times daily. 180 tablet 3  . Cholecalciferol (VITAMIN D-3 PO) Take 1 capsule by mouth daily.    Marland Kitchen levothyroxine (SYNTHROID) 112 MCG tablet Take 1 tablet by mouth once daily (Patient taking differently: Take 112 mcg by mouth daily before breakfast.) 90 tablet 1  . Melatonin 10 MG TABS Take 10 mg by mouth at bedtime.    . metoprolol succinate (TOPROL-XL) 25 MG 24 hr tablet Take 1 tablet (25 mg total) by mouth daily. 90 tablet 3  . Omega-3 Fatty Acids (FISH OIL) 1000 MG CAPS Take 1,000 mg by mouth daily.    . OXYGEN Inhale 2-3 L/min into the lungs See admin instructions. Inhale 2 L/min into the lungs continuously and 3 L/min with any exertion    . predniSONE (STERAPRED UNI-PAK 21 TAB) 10 MG (21) TBPK tablet Take 40 mg (4 tablets) for 1 day, then 30 mg (3 tablets) for 1 day, then 20 mg (2 tablets) for one day, then resume home dose of 10 mg daily. 10 tablet 0  . simvastatin (ZOCOR) 20 MG tablet Take 1 tablet (20 mg total) by mouth at bedtime. 90 tablet 1  . STIOLTO RESPIMAT 2.5-2.5 MCG/ACT AERS INHALE 2 PUFFS BY MOUTH ONCE DAILY. PLEASE SEE DR FOR REFILLS. (Patient taking differently: Inhale 2 puffs into the lungs daily.) 4 g 12  . furosemide (LASIX) 20 MG tablet Take 1 tablet (20 mg total) by mouth 2 (two) times daily. (Patient not taking: Reported on  06/17/2020) 60 tablet 1  . predniSONE (DELTASONE) 10 MG tablet Resume following 3 day taper. (Patient not taking: Reported on 06/17/2020) 30 tablet 1   No current facility-administered medications for this encounter.    Physical Findings: The patient is in no acute distress. Patient is alert and oriented.  height is _0  (1.626 m) and weight is 117 lb 12.8 oz (53.4 kg). Her temperature is 98.3 F (36.8 C). Her blood pressure is 139/54 (abnormal) and her pulse is 51 (abnormal). Her respiration is 24 (abnormal) and oxygen saturation is 93%. .  She is on supplemental oxygen 3 to 4 L Lungs are clear to auscultation bilaterally. Heart  has regular rhythm with decreased rate. No palpable cervical, supraclavicular, or axillary adenopathy. Abdomen soft, non-tender, normal bowel sounds.   Lab Findings: Lab Results  Component Value Date   WBC 3.6 (L) 06/14/2020   HGB 12.8 06/14/2020   HCT 40.9 06/14/2020   MCV 103.0 (H) 06/14/2020   PLT 105 (L) 06/14/2020    Radiographic Findings: DG CHEST PORT 1 VIEW  Result Date: 06/13/2020 CLINICAL DATA:  Shortness of breath. EXAM: PORTABLE CHEST 1 VIEW COMPARISON:  06/09/2020 FINDINGS: Previous median sternotomy. Stable cardiac enlargement. Extensive chronic lung disease is identified bilaterally compatible with emphysema. There are small bilateral pleural effusions and mild diffuse edema concerning for CHF. Hazy opacities within the left upper lobe may represent an area of asymmetric alveolar edema versus infection. IMPRESSION: 1. Congestive heart failure. 2. Hazy opacities within the left upper lobe may represent asymmetric alveolar edema versus infection. Electronically Signed   By: Kerby Moors M.D.   On: 06/13/2020 09:43   DG Chest Port 1 View  Result Date: 06/09/2020 CLINICAL DATA:  Worsening shortness of breath. History COPD and prior lung carcinoma. EXAM: PORTABLE CHEST 1 VIEW COMPARISON:  11/30/2019 FINDINGS: Stable heart size. Stable evidence of emphysematous lung disease with bilateral hyperinflation and scattered parenchymal scarring, most prominently at the right lung base. Prominence of central pulmonary arteries is suggestive of underlying pulmonary hypertension. No pneumothorax, pulmonary edema or significant pleural effusions. IMPRESSION: Stable emphysematous lung disease and probable pulmonary hypertension. Electronically Signed   By: Aletta Edouard M.D.   On: 06/09/2020 13:15   ECHOCARDIOGRAM COMPLETE  Result Date: 06/12/2020    ECHOCARDIOGRAM REPORT   Patient Name:   [name] Date of Exam: 06/12/2020 Medical Rec #:  [id number]     Height:       64.0 in Accession #:     [id number]    Weight:       115.5 lb Date of Birth:  1930-03-13    BSA:          1.549 m Patient Age:    79 years      BP:           121/72 mmHg Patient Gender: F             HR:           62 bpm. Exam Location:  Inpatient Procedure: 2D Echo, Color Doppler and Cardiac Doppler Indications:     I48.91* Unspecified atrial fibrillation  History:         Patient has prior history of Echocardiogram examinations, most                  recent 06/25/2019. Arrythmias:Atrial Fibrillation; Risk                  Factors:Hypertension and Dyslipidemia.  Sonographer:  Raquel Sarna Senior RDCS Referring Phys:  [id number] RHONDA G BARRETT Diagnosing Phys: Jenkins Rouge MD IMPRESSIONS  1. Left ventricular ejection fraction, by estimation, is 60 to 65%. The left ventricle has normal function. The left ventricle has no regional wall motion abnormalities. Left ventricular diastolic parameters are consistent with Grade II diastolic dysfunction (pseudonormalization). Elevated left ventricular end-diastolic pressure.  2. Right ventricular systolic function is mildly reduced. The right ventricular size is mildly enlarged. There is severely elevated pulmonary artery systolic pressure.  3. Left atrial size was moderately dilated.  4. Right atrial size was moderately dilated.  5. The mitral valve is abnormal. Trivial mitral valve regurgitation. No evidence of mitral stenosis.  6. Tricuspid valve regurgitation is moderate.  7. Post 21 mm Medtronic Freestyle valve normal functioning . The aortic valve has been repaired/replaced. Aortic valve regurgitation is not visualized. No aortic stenosis is present.  8. The inferior vena cava is dilated in size with >50% respiratory variability, suggesting right atrial pressure of 8 mmHg. FINDINGS  Left Ventricle: Left ventricular ejection fraction, by estimation, is 60 to 65%. The left ventricle has normal function. The left ventricle has no regional wall motion abnormalities. The left ventricular internal cavity size  was normal in size. There is  no left ventricular hypertrophy. Left ventricular diastolic parameters are consistent with Grade II diastolic dysfunction (pseudonormalization). Elevated left ventricular end-diastolic pressure. Right Ventricle: The right ventricular size is mildly enlarged. No increase in right ventricular wall thickness. Right ventricular systolic function is mildly reduced. There is severely elevated pulmonary artery systolic pressure. The tricuspid regurgitant velocity is 3.69 m/s, and with an assumed right atrial pressure of 15 mmHg, the estimated right ventricular systolic pressure is 19.4 mmHg. Left Atrium: Left atrial size was moderately dilated. Right Atrium: Right atrial size was moderately dilated. Pericardium: There is no evidence of pericardial effusion. Mitral Valve: The mitral valve is abnormal. There is mild thickening of the mitral valve leaflet(s). There is mild calcification of the mitral valve leaflet(s). Mild mitral annular calcification. Trivial mitral valve regurgitation. No evidence of mitral valve stenosis. MV peak gradient, 14.8 mmHg. The mean mitral valve gradient is 4.0 mmHg. Tricuspid Valve: The tricuspid valve is normal in structure. Tricuspid valve regurgitation is moderate . No evidence of tricuspid stenosis. Aortic Valve: Post 21 mm Medtronic Freestyle valve normal functioning. The aortic valve has been repaired/replaced. Aortic valve regurgitation is not visualized. No aortic stenosis is present. Aortic valve mean gradient measures 6.0 mmHg. Aortic valve peak gradient measures 10.5 mmHg. Aortic valve area, by VTI measures 2.19 cm. Pulmonic Valve: The pulmonic valve was normal in structure. Pulmonic valve regurgitation is not visualized. No evidence of pulmonic stenosis. Aorta: The aortic root is normal in size and structure. Venous: The inferior vena cava is dilated in size with greater than 50% respiratory variability, suggesting right atrial pressure of 8 mmHg.  IAS/Shunts: No atrial level shunt detected by color flow Doppler.  LEFT VENTRICLE PLAX 2D LVIDd:         3.20 cm  Diastology LVIDs:         2.30 cm  LV e' medial:    6.31 cm/s LV PW:         1.10 cm  LV E/e' medial:  29.5 LV IVS:        1.00 cm  LV e' lateral:   5.98 cm/s LVOT diam:     2.00 cm  LV E/e' lateral: 31.1 LV SV:         81  LV SV Index:   52 LVOT Area:     3.14 cm  RIGHT VENTRICLE RV S prime:     10.20 cm/s TAPSE (M-mode): 1.8 cm LEFT ATRIUM             Index       RIGHT ATRIUM           Index LA diam:        4.50 cm 2.90 cm/m  RA Area:     26.70 cm LA Vol (A2C):   80.3 ml 51.83 ml/m RA Volume:   96.70 ml  62.41 ml/m LA Vol (A4C):   82.9 ml 53.51 ml/m LA Biplane Vol: 84.1 ml 54.28 ml/m  AORTIC VALVE AV Area (Vmax):    2.21 cm AV Area (Vmean):   2.31 cm AV Area (VTI):     2.19 cm AV Vmax:           162.00 cm/s AV Vmean:          111.000 cm/s AV VTI:            0.368 m AV Peak Grad:      10.5 mmHg AV Mean Grad:      6.0 mmHg LVOT Vmax:         114.00 cm/s LVOT Vmean:        81.500 cm/s LVOT VTI:          0.257 m LVOT/AV VTI ratio: 0.70  AORTA Ao Root diam: 3.10 cm MITRAL VALVE                TRICUSPID VALVE MV Area (PHT): 3.43 cm     TR Peak grad:   54.5 mmHg MV Area VTI:   1.57 cm     TR Vmax:        369.00 cm/s MV Peak grad:  14.8 mmHg MV Mean grad:  4.0 mmHg     SHUNTS MV Vmax:       1.92 m/s     Systemic VTI:  0.26 m MV Vmean:      87.1 cm/s    Systemic Diam: 2.00 cm MV Decel Time: 221 msec MV E velocity: 186.00 cm/s MV A velocity: 62.60 cm/s MV E/A ratio:  2.97 Jenkins Rouge MD Electronically signed by Jenkins Rouge MD Signature Date/Time: 06/12/2020/2:39:27 PM    Final (Updated)    VAS [place] LOWER EXTREMITY VENOUS (DVT)  Result Date: 06/13/2020  Lower Venous DVT Study Indications: Swelling, and Edema.  Comparison Study: no prior Performing Technologist: Abram Sander RVS  Examination Guidelines: A complete evaluation includes B-mode imaging, spectral Doppler, color Doppler, and power Doppler  as needed of all accessible portions of each vessel. Bilateral testing is considered an integral part of a complete examination. Limited examinations for reoccurring indications may be performed as noted. The reflux portion of the exam is performed with the patient in reverse Trendelenburg.  +---------+---------------+---------+-----------+----------+--------------+ RIGHT    CompressibilityPhasicitySpontaneityPropertiesThrombus Aging +---------+---------------+---------+-----------+----------+--------------+ CFV      Full           Yes      Yes                                 +---------+---------------+---------+-----------+----------+--------------+ SFJ      Full                                                        +---------+---------------+---------+-----------+----------+--------------+  FV Prox  Full                                                        +---------+---------------+---------+-----------+----------+--------------+ FV Mid   Full                                                        +---------+---------------+---------+-----------+----------+--------------+ FV DistalFull                                                        +---------+---------------+---------+-----------+----------+--------------+ PFV      Full                                                        +---------+---------------+---------+-----------+----------+--------------+ POP      Full           Yes      Yes                                 +---------+---------------+---------+-----------+----------+--------------+ PTV      Full                                                        +---------+---------------+---------+-----------+----------+--------------+ PERO     Full                                                        +---------+---------------+---------+-----------+----------+--------------+    +---------+---------------+---------+-----------+----------+--------------+ LEFT     CompressibilityPhasicitySpontaneityPropertiesThrombus Aging +---------+---------------+---------+-----------+----------+--------------+ CFV      Full           Yes      Yes                                 +---------+---------------+---------+-----------+----------+--------------+ SFJ      Full                                                        +---------+---------------+---------+-----------+----------+--------------+ FV Prox  Full                                                        +---------+---------------+---------+-----------+----------+--------------+  FV Mid   Full                                                        +---------+---------------+---------+-----------+----------+--------------+ FV DistalFull                                                        +---------+---------------+---------+-----------+----------+--------------+ PFV      Full                                                        +---------+---------------+---------+-----------+----------+--------------+ POP      Full           Yes      Yes                                 +---------+---------------+---------+-----------+----------+--------------+ PTV      Full                                                        +---------+---------------+---------+-----------+----------+--------------+ PERO     Full                                                        +---------+---------------+---------+-----------+----------+--------------+     Summary: BILATERAL: - No evidence of deep vein thrombosis seen in the lower extremities, bilaterally. - No evidence of superficial venous thrombosis in the lower extremities, bilaterally. -No evidence of popliteal cyst, bilaterally.   *See table(s) above for measurements and observations. Electronically signed by Servando Snare MD on 06/13/2020 at  4:42:40 PM.    Final     Impression: PET-avid right lower lobe lung nodule consistent with primary bronchogenic carcinoma; possible left upper lobe synchronous bronchogenic carcinoma  The patient tolerated her SBRT well except for fatigue.  Other events noted as above requiring admission  Plan: The patient will follow-up with radiation oncology in 3 months. prior to this appointment she will undergo a CT scan of the chest to follow-up on her SBRT treatments.    ____________________________________   Blair Promise, PhD, MD  This document serves as a record of services personally performed by Gery Pray, MD. It was created on his behalf by Clerance Lav, a trained medical scribe. The creation of this record is based on the scribe's personal observations and the provider's statements to them. This document has been checked and approved by the attending provider.

## 2020-06-17 NOTE — Progress Notes (Incomplete)
  Patient Name: [name] MRN: [id number] DOB: 14-Jun-1929 Referring Physician: Baltazar Apo (Profile Not Attached) Date of Service: 05/15/2020 [facility], [place]                                                        End Of Treatment Note  Diagnoses: R91.1-Solitary pulmonary nodule  Cancer Staging: PET-avid right lower lobe lung nodule consistent with primary bronchogenic carcinoma; possible left upper lobe synchronous bronchogenic carcinoma  Intent: Curative  Radiation Treatment Dates: 05/02/2020 through 05/15/2020  Site / Technique / Total Dose (Gy) / Dose per Fx (Gy) / Completed Fx / Beam Energies Right lung; IMRT. A total of 60 Gy given at 12 Gy per fraction for a total of 5 fractions. 6XFFF beam energy.  Narrative: The patient tolerated radiation therapy relatively well. She did report moderate fatigue, shortness of breath with exertion, and felt that liquids were getting stuck in her chest when swallowing. She denied cough, hemoptysis, and skin changes.  Plan: The patient will follow-up with radiation oncology in one month.  ________________________________________________   Blair Promise, PhD, MD  This document serves as a record of services personally performed by Gery Pray, MD. It was created on his behalf by Clerance Lav, a trained medical scribe. The creation of this record is based on the scribe's personal observations and the provider's statements to them. This document has been checked and approved by the attending provider.

## 2020-06-18 DIAGNOSIS — J438 Other emphysema: Secondary | ICD-10-CM | POA: Diagnosis not present

## 2020-06-18 DIAGNOSIS — J449 Chronic obstructive pulmonary disease, unspecified: Secondary | ICD-10-CM | POA: Diagnosis not present

## 2020-06-19 ENCOUNTER — Other Ambulatory Visit: Payer: Self-pay | Admitting: *Deleted

## 2020-06-19 NOTE — Patient Outreach (Signed)
[facility]) Care Management  06/19/2020  Renee TRETO [birth year] [id number]   EMMI-GENERAL DISCHARGE-UNSUCCESSFUL RED ON EMMI ALERT Day #1 Date: 06/18/20 Red Alert Reason: DON'T KNOW WHO TO CALL  OUTREACH#1 RN attempted outreach unsuccessful. Able to leave a HIPAA approved voice message. Will also send outreach letter and follow up once again in a few days.  Renee Mina, RN Care Management Coordinator [place] [facility] [phone]

## 2020-06-20 ENCOUNTER — Encounter: Payer: Self-pay | Admitting: Adult Health

## 2020-06-20 ENCOUNTER — Other Ambulatory Visit: Payer: Self-pay

## 2020-06-20 ENCOUNTER — Telehealth: Payer: Self-pay | Admitting: Physician Assistant

## 2020-06-20 ENCOUNTER — Ambulatory Visit: Payer: Medicare Other | Admitting: Adult Health

## 2020-06-20 DIAGNOSIS — J9611 Chronic respiratory failure with hypoxia: Secondary | ICD-10-CM | POA: Diagnosis not present

## 2020-06-20 DIAGNOSIS — I272 Pulmonary hypertension, unspecified: Secondary | ICD-10-CM

## 2020-06-20 DIAGNOSIS — J449 Chronic obstructive pulmonary disease, unspecified: Secondary | ICD-10-CM | POA: Diagnosis not present

## 2020-06-20 DIAGNOSIS — C3431 Malignant neoplasm of lower lobe, right bronchus or lung: Secondary | ICD-10-CM

## 2020-06-20 DIAGNOSIS — I5033 Acute on chronic diastolic (congestive) heart failure: Secondary | ICD-10-CM

## 2020-06-20 NOTE — Assessment & Plan Note (Signed)
O2 sats are adequate on current regimen.  Continue on 2 L at rest and 4 L with activity.  O2 saturation goal 88 to 90% or greater.

## 2020-06-20 NOTE — Assessment & Plan Note (Signed)
Decompensated diastolic heart failure.  Exam shows 2+ lower extremity edema.  She has no significant increased work of breathing at rest.  O2 saturations are adequate on oxygen.  Agree to proceed with gentle diuresis, continue follow-up with cardiology.  Plan  Patient Instructions  Continue on Stiolto 2 puffs daily Continue on Prednisone 1m daily  Continue on Oxygen 2l/m rest and 4l/m with activity .  Take Lasix as directed  Low salt diet  Follow up with Radiation oncology /CT chest as planned ProAir inhaler As needed   Activity as tolerated.  Follow up with Cardiology as discussed.  Follow up in 3 months with Dr. BLamonte Sakai And As needed   Please contact office for sooner follow up if symptoms do not improve or worsen or seek emergency care

## 2020-06-20 NOTE — Progress Notes (Signed)
_0  ID: [name], female    DOB: 1930-01-21, 87 y.o.   MRN: [id number]  Chief Complaint  Patient presents with  . COPD  . Hospitalization Follow-up    Referring provider: Ellouise Newer  HPI: 87 year old female former smoker followed for COPD and chronic respiratory failure on oxygen, hypermetabolic right lower lobe lung mass Medical history significant for A. fib, Pleural plaques with asbestos exposure, CKD, Right sided hydronephrosis   TEST/EVENTS :  CT chest January 31, 2020 showed interval increase in size of spiculated mass in the right lower lobe now measuring 3 x 2.9 cm, no evidence of metastatic disease.  PET scan February 22, 2020 showed large hypermetabolic nodule in the right lower lobe consistent with bronchogenic carcinoma, no evidence of mediastinal nodal metastasis.,  Small hypermetabolic nodule in the left upper lobe favor synchronous bronchogenic carcinoma, no distant metastatic disease.  2D echo January 2022 EF 1700%, grade 2 diastolic dysfunction, decreased right ventricle systolic function severely elevated pulmonary artery pressure  06/20/2020 Follow up : COPD , O2 RF , Hypermetabolic lung mass Patient returns for a follow-up visit.  Patient has severe COPD that is steroid and oxygen dependent.  She is on Stiolto daily.  She is on chronic prednisone's with 10 mg daily. Patient says she continues to have significant shortness of breath with activity.  She is sedentary.  She denies flare of cough.  She is more short of breath and has noticed increased lower extremity swelling.  She was contacted by her primary doctor and instructed to increase Lasix to 60 mg for the next 2 days.  Patient was found to have a hypermetabolic right lower lobe lung mass consistent with a primary bronchogenic carcinoma and a possible left upper lobe synchronous bronchogenic carcinoma.  She has been seen by radiation oncology and underwent SBRT x 5 to the right lung completing  at May 15, 2020 She has a follow-up appointment with radiation oncology in 3 months with a follow-up CT scan.  Was admitted January 23 through January 29 for a flutter with RVR.  She was treated with IV amiodarone, heparin and Eliquis.  Amiodarone was stopped prior to discharge due to her underlying severe lung disease.  She did require diuresis.  Echo did show severe pulmonary hypertension.  Patient was unable to do VQ scan.  Dyspnea did improve after diuresis and treatment of a flutter.  And she was started on anticoagulation.  Feels better, but gets more winded with walking . Ankles very swollen . Started extra lasix yesterday , does feel it is helping some .   She remains on oxygen 2 L at rest and 4 L with activity.  Lives independently at home . Lives alone. Family helps.   Allergies  Allergen Reactions  . Clarithromycin Other (See Comments)    Made the mouth/throat sore  . Tamsulosin Itching  . Trazodone And Nefazodone Other (See Comments)    Dizziness   . Penicillins Itching and Rash    Did it involve swelling of the face/tongue/throat, SOB, or low BP? No Did it involve sudden or severe rash/hives, skin peeling, or any reaction on the inside of your mouth or nose? Yes Did you need to seek medical attention at a hospital or doctor's office? Yes When did it last happen?Young woman  If all above answers are "NO", may proceed with cephalosporin use.     Immunization History  Administered Date(s) Administered  . Fluad Quad(high Dose 65+) 02/01/2019  . Influenza Whole  03/16/2012  . Influenza, High Dose Seasonal PF 02/09/2017, 03/02/2018, 01/17/2019  . Influenza,inj,Quad PF,6+ Mos 03/20/2013, 02/18/2015  . Influenza-Unspecified 02/15/2014, 12/17/2015, 02/09/2017, 03/02/2018, 02/19/2020  . PFIZER(Purple Top)SARS-COV-2 Vaccination 06/01/2019, 06/22/2019, 03/26/2020  . Pneumococcal Conjugate-13 10/24/2013  . Pneumococcal Polysaccharide-23 02/09/2017  . Tdap 11/27/2019   . Zoster Recombinat (Shingrix) 07/17/2018, 02/28/2019    Past Medical History:  Diagnosis Date  . Allergic rhinitis   . Aortic stenosis   . Carotid artery disease ([place])   . Colon polyps   . Congestive heart failure (CHF) ([place])   . h/o Atrial fibrillation ([place]) 03/22/2011   Brief post op   . Hematuria 11/05/2015  . Hypercalcemia   . Hypertension   . Hypothyroid   . Lung cancer, lower lobe ([place])   . Osteopenia   . 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 Pressure urticaria   . RLS (restless legs syndrome)   . S/P aortic valve replacement and aortoplasty 04/13/2011   34m Medtronic Freestyle porcine aortic root   . S/P aortic valve replacement with stentless valve 03/18/2011    Tobacco History: Social History   Tobacco Use  Smoking Status Former Smoker  . Packs/day: 0.90  . Years: 62.00  . Pack years: 55.80  . Types: Cigarettes  . Quit date: 07/14/2010  . Years since quitting: 9.9  Smokeless Tobacco Never Used   Counseling given: Not Answered   Outpatient Medications Prior to Visit  Medication Sig Dispense Refill  . acetaminophen (TYLENOL) 500 MG tablet Take 500 mg by mouth every 6 (six) hours as needed. Take 3 tablets every 6 hours    . albuterol (PROAIR HFA) 108 (90 Base) MCG/ACT inhaler Inhale 2 puffs into the lungs every 6 (six) hours as needed for wheezing or shortness of breath. 18 g 5  . amitriptyline (ELAVIL) 10 MG tablet Take 1 tablet (10 mg total) by mouth at bedtime. 30 tablet 0  . apixaban (ELIQUIS) 2.5 MG TABS tablet Take 1 tablet (2.5 mg total) by mouth 2 (two) times daily. 180 tablet 3  . Cholecalciferol (VITAMIN D-3 PO) Take 1 capsule by mouth daily.    . furosemide (LASIX) 20 MG tablet Take 1 tablet (20 mg total) by mouth 2 (two) times daily. 60 tablet 1  . levothyroxine (SYNTHROID) 112 MCG tablet Take 1 tablet by mouth once daily (Patient taking differently: Take 112 mcg by mouth daily before breakfast.) 90 tablet 1  . Melatonin 10 MG TABS Take 10 mg by mouth at  bedtime.    . metoprolol succinate (TOPROL-XL) 25 MG 24 hr tablet Take 1 tablet (25 mg total) by mouth daily. 90 tablet 3  . Omega-3 Fatty Acids (FISH OIL) 1000 MG CAPS Take 1,000 mg by mouth daily.    . OXYGEN Inhale 2-3 L/min into the lungs See admin instructions. Inhale 2 L/min into the lungs continuously and 3 L/min with any exertion    . predniSONE (DELTASONE) 10 MG tablet Resume following 3 day taper. 30 tablet 1  . simvastatin (ZOCOR) 20 MG tablet Take 1 tablet (20 mg total) by mouth at bedtime. 90 tablet 1  . STIOLTO RESPIMAT 2.5-2.5 MCG/ACT AERS INHALE 2 PUFFS BY MOUTH ONCE DAILY. PLEASE SEE DR FOR REFILLS. (Patient taking differently: Inhale 2 puffs into the lungs daily.) 4 g 12  . predniSONE (STERAPRED UNI-PAK 21 TAB) 10 MG (21) TBPK tablet Take 40 mg (4 tablets) for 1 day, then 30 mg (3 tablets) for 1 day, then 20 mg (2 tablets) for one day, then resume  home dose of 10 mg daily. (Patient not taking: Reported on 06/20/2020) 10 tablet 0   No facility-administered medications prior to visit.     Review of Systems:   Constitutional:   No  weight loss, night sweats,  Fevers, chills, + We can do this we can fatigue, or  lassitude.  HEENT:   No headaches,  Difficulty swallowing,  Tooth/dental problems, or  Sore throat,                No sneezing, itching, ear ache, nasal congestion, post nasal drip,   CV:  No chest pain,  Orthopnea, PND, swelling in lower extremities, anasarca, dizziness, palpitations, syncope.   GI  No heartburn, indigestion, abdominal pain, nausea, vomiting, diarrhea, change in bowel habits, loss of appetite, bloody stools.   Resp:    No chest wall deformity  Skin: no rash or lesions.  GU: no dysuria, change in color of urine, no urgency or frequency.  No flank pain, no hematuria   MS:  No joint pain or swelling.  No decreased range of motion.  No back pain.    Physical Exam  BP (!) 122/56 (BP Location: Left Arm, Patient Position: Sitting, Cuff Size: Normal)    Pulse (!) 54   Temp (!) 97.3 F (36.3 C) (Temporal)   Ht 5' 4" (1.626 m)   Wt 117 lb 9.6 oz (53.3 kg)   BMI 20.19 kg/m   GEN: A/Ox3; pleasant , NAD, elderly, chronically ill-appearing on oxygen   HEENT:  [place]/AT,   NOSE-clear, THROAT-clear, no lesions, no postnasal drip or exudate noted.   NECK:  Supple w/ fair ROM; no JVD; normal carotid impulses w/o bruits; no thyromegaly or nodules palpated; no lymphadenopathy.    RESP diminished breath sounds at the bases   no accessory muscle use, no dullness to percussion Speaks in full sentences  CARD:  RRR, no m/r/g, 2+ peripheral edema, pulses intact, no cyanosis or clubbing.  GI:   Soft & nt; nml bowel sounds; no organomegaly or masses detected.   Musco: Warm bil, no deformities or joint swelling noted.   Neuro: alert, no focal deficits noted.    Skin: Warm, no lesions or rashes    Lab Results:  CBC  BMET  ProBNP No results found for: PROBNP  Imaging: DG CHEST PORT 1 VIEW  Result Date: 06/13/2020 CLINICAL DATA:  Shortness of breath. EXAM: PORTABLE CHEST 1 VIEW COMPARISON:  06/09/2020 FINDINGS: Previous median sternotomy. Stable cardiac enlargement. Extensive chronic lung disease is identified bilaterally compatible with emphysema. There are small bilateral pleural effusions and mild diffuse edema concerning for CHF. Hazy opacities within the left upper lobe may represent an area of asymmetric alveolar edema versus infection. IMPRESSION: 1. Congestive heart failure. 2. Hazy opacities within the left upper lobe may represent asymmetric alveolar edema versus infection. Electronically Signed   By: Kerby Moors M.D.   On: 06/13/2020 09:43   DG Chest Port 1 View  Result Date: 06/09/2020 CLINICAL DATA:  Worsening shortness of breath. History COPD and prior lung carcinoma. EXAM: PORTABLE CHEST 1 VIEW COMPARISON:  11/30/2019 FINDINGS: Stable heart size. Stable evidence of emphysematous lung disease with bilateral hyperinflation and  scattered parenchymal scarring, most prominently at the right lung base. Prominence of central pulmonary arteries is suggestive of underlying pulmonary hypertension. No pneumothorax, pulmonary edema or significant pleural effusions. IMPRESSION: Stable emphysematous lung disease and probable pulmonary hypertension. Electronically Signed   By: Aletta Edouard M.D.   On: 06/09/2020 13:15  ECHOCARDIOGRAM COMPLETE  Result Date: 06/12/2020    ECHOCARDIOGRAM REPORT   Patient Name:   DIAMANTE TRUSZKOWSKI Date of Exam: 06/12/2020 Medical Rec #:  [id number]     Height:       64.0 in Accession #:    [id number]    Weight:       115.5 lb Date of Birth:  1930-03-07    BSA:          1.549 m Patient Age:    11 years      BP:           121/72 mmHg Patient Gender: F             HR:           62 bpm. Exam Location:  Inpatient Procedure: 2D Echo, Color Doppler and Cardiac Doppler Indications:     I48.91* Unspecified atrial fibrillation  History:         Patient has prior history of Echocardiogram examinations, most                  recent 06/25/2019. Arrythmias:Atrial Fibrillation; Risk                  Factors:Hypertension and Dyslipidemia.  Sonographer:     Raquel Sarna Senior RDCS Referring Phys:  [id number] RHONDA G BARRETT Diagnosing Phys: Jenkins Rouge MD IMPRESSIONS  1. Left ventricular ejection fraction, by estimation, is 60 to 65%. The left ventricle has normal function. The left ventricle has no regional wall motion abnormalities. Left ventricular diastolic parameters are consistent with Grade II diastolic dysfunction (pseudonormalization). Elevated left ventricular end-diastolic pressure.  2. Right ventricular systolic function is mildly reduced. The right ventricular size is mildly enlarged. There is severely elevated pulmonary artery systolic pressure.  3. Left atrial size was moderately dilated.  4. Right atrial size was moderately dilated.  5. The mitral valve is abnormal. Trivial mitral valve regurgitation. No evidence of mitral stenosis.   6. Tricuspid valve regurgitation is moderate.  7. Post 21 mm Medtronic Freestyle valve normal functioning . The aortic valve has been repaired/replaced. Aortic valve regurgitation is not visualized. No aortic stenosis is present.  8. The inferior vena cava is dilated in size with >50% respiratory variability, suggesting right atrial pressure of 8 mmHg. FINDINGS  Left Ventricle: Left ventricular ejection fraction, by estimation, is 60 to 65%. The left ventricle has normal function. The left ventricle has no regional wall motion abnormalities. The left ventricular internal cavity size was normal in size. There is  no left ventricular hypertrophy. Left ventricular diastolic parameters are consistent with Grade II diastolic dysfunction (pseudonormalization). Elevated left ventricular end-diastolic pressure. Right Ventricle: The right ventricular size is mildly enlarged. No increase in right ventricular wall thickness. Right ventricular systolic function is mildly reduced. There is severely elevated pulmonary artery systolic pressure. The tricuspid regurgitant velocity is 3.69 m/s, and with an assumed right atrial pressure of 15 mmHg, the estimated right ventricular systolic pressure is 53.7 mmHg. Left Atrium: Left atrial size was moderately dilated. Right Atrium: Right atrial size was moderately dilated. Pericardium: There is no evidence of pericardial effusion. Mitral Valve: The mitral valve is abnormal. There is mild thickening of the mitral valve leaflet(s). There is mild calcification of the mitral valve leaflet(s). Mild mitral annular calcification. Trivial mitral valve regurgitation. No evidence of mitral valve stenosis. MV peak gradient, 14.8 mmHg. The mean mitral valve gradient is 4.0 mmHg. Tricuspid Valve: The tricuspid valve is normal in structure. Tricuspid valve  regurgitation is moderate . No evidence of tricuspid stenosis. Aortic Valve: Post 21 mm Medtronic Freestyle valve normal functioning. The aortic  valve has been repaired/replaced. Aortic valve regurgitation is not visualized. No aortic stenosis is present. Aortic valve mean gradient measures 6.0 mmHg. Aortic valve peak gradient measures 10.5 mmHg. Aortic valve area, by VTI measures 2.19 cm. Pulmonic Valve: The pulmonic valve was normal in structure. Pulmonic valve regurgitation is not visualized. No evidence of pulmonic stenosis. Aorta: The aortic root is normal in size and structure. Venous: The inferior vena cava is dilated in size with greater than 50% respiratory variability, suggesting right atrial pressure of 8 mmHg. IAS/Shunts: No atrial level shunt detected by color flow Doppler.  LEFT VENTRICLE PLAX 2D LVIDd:         3.20 cm  Diastology LVIDs:         2.30 cm  LV e' medial:    6.31 cm/s LV PW:         1.10 cm  LV E/e' medial:  29.5 LV IVS:        1.00 cm  LV e' lateral:   5.98 cm/s LVOT diam:     2.00 cm  LV E/e' lateral: 31.1 LV SV:         81 LV SV Index:   52 LVOT Area:     3.14 cm  RIGHT VENTRICLE RV S prime:     10.20 cm/s TAPSE (M-mode): 1.8 cm LEFT ATRIUM             Index       RIGHT ATRIUM           Index LA diam:        4.50 cm 2.90 cm/m  RA Area:     26.70 cm LA Vol (A2C):   80.3 ml 51.83 ml/m RA Volume:   96.70 ml  62.41 ml/m LA Vol (A4C):   82.9 ml 53.51 ml/m LA Biplane Vol: 84.1 ml 54.28 ml/m  AORTIC VALVE AV Area (Vmax):    2.21 cm AV Area (Vmean):   2.31 cm AV Area (VTI):     2.19 cm AV Vmax:           162.00 cm/s AV Vmean:          111.000 cm/s AV VTI:            0.368 m AV Peak Grad:      10.5 mmHg AV Mean Grad:      6.0 mmHg LVOT Vmax:         114.00 cm/s LVOT Vmean:        81.500 cm/s LVOT VTI:          0.257 m LVOT/AV VTI ratio: 0.70  AORTA Ao Root diam: 3.10 cm MITRAL VALVE                TRICUSPID VALVE MV Area (PHT): 3.43 cm     TR Peak grad:   54.5 mmHg MV Area VTI:   1.57 cm     TR Vmax:        369.00 cm/s MV Peak grad:  14.8 mmHg MV Mean grad:  4.0 mmHg     SHUNTS MV Vmax:       1.92 m/s     Systemic VTI:  0.26  m MV Vmean:      87.1 cm/s    Systemic Diam: 2.00 cm MV Decel Time: 221 msec MV E velocity: 186.00 cm/s MV A velocity: 62.60  cm/s MV E/A ratio:  2.97 Jenkins Rouge MD Electronically signed by Jenkins Rouge MD Signature Date/Time: 06/12/2020/2:39:27 PM    Final (Updated)    VAS [place] LOWER EXTREMITY VENOUS (DVT)  Result Date: 06/13/2020  Lower Venous DVT Study Indications: Swelling, and Edema.  Comparison Study: no prior Performing Technologist: Abram Sander RVS  Examination Guidelines: A complete evaluation includes B-mode imaging, spectral Doppler, color Doppler, and power Doppler as needed of all accessible portions of each vessel. Bilateral testing is considered an integral part of a complete examination. Limited examinations for reoccurring indications may be performed as noted. The reflux portion of the exam is performed with the patient in reverse Trendelenburg.  +---------+---------------+---------+-----------+----------+--------------+ RIGHT    CompressibilityPhasicitySpontaneityPropertiesThrombus Aging +---------+---------------+---------+-----------+----------+--------------+ CFV      Full           Yes      Yes                                 +---------+---------------+---------+-----------+----------+--------------+ SFJ      Full                                                        +---------+---------------+---------+-----------+----------+--------------+ FV Prox  Full                                                        +---------+---------------+---------+-----------+----------+--------------+ FV Mid   Full                                                        +---------+---------------+---------+-----------+----------+--------------+ FV DistalFull                                                        +---------+---------------+---------+-----------+----------+--------------+ PFV      Full                                                         +---------+---------------+---------+-----------+----------+--------------+ POP      Full           Yes      Yes                                 +---------+---------------+---------+-----------+----------+--------------+ PTV      Full                                                        +---------+---------------+---------+-----------+----------+--------------+  PERO     Full                                                        +---------+---------------+---------+-----------+----------+--------------+   +---------+---------------+---------+-----------+----------+--------------+ LEFT     CompressibilityPhasicitySpontaneityPropertiesThrombus Aging +---------+---------------+---------+-----------+----------+--------------+ CFV      Full           Yes      Yes                                 +---------+---------------+---------+-----------+----------+--------------+ SFJ      Full                                                        +---------+---------------+---------+-----------+----------+--------------+ FV Prox  Full                                                        +---------+---------------+---------+-----------+----------+--------------+ FV Mid   Full                                                        +---------+---------------+---------+-----------+----------+--------------+ FV DistalFull                                                        +---------+---------------+---------+-----------+----------+--------------+ PFV      Full                                                        +---------+---------------+---------+-----------+----------+--------------+ POP      Full           Yes      Yes                                 +---------+---------------+---------+-----------+----------+--------------+ PTV      Full                                                         +---------+---------------+---------+-----------+----------+--------------+ PERO     Full                                                        +---------+---------------+---------+-----------+----------+--------------+  Summary: BILATERAL: - No evidence of deep vein thrombosis seen in the lower extremities, bilaterally. - No evidence of superficial venous thrombosis in the lower extremities, bilaterally. -No evidence of popliteal cyst, bilaterally.   *See table(s) above for measurements and observations. Electronically signed by Servando Snare MD on 06/13/2020 at 4:42:40 PM.    Final       PFT Results Latest Ref Rng & Units 06/18/2016  FVC-Pre L 1.36  FVC-Predicted Pre % 58  FVC-Post L 1.76  FVC-Predicted Post % 75  Pre FEV1/FVC % % 53  Post FEV1/FCV % % 53  FEV1-Pre L 0.73  FEV1-Predicted Pre % 42  FEV1-Post L 0.93  DLCO uncorrected ml/min/mmHg 11.12  DLCO UNC% % 47  DLCO corrected ml/min/mmHg 10.84  DLCO COR %Predicted % 46  DLVA Predicted % 71  TLC L 5.00  TLC % Predicted % 101  RV % Predicted % 135    No results found for: NITRICOXIDE      Assessment & Plan:   Chronic obstructive pulmonary disease (HCC) Severe COPD steroid and oxygen dependent.  Patient has significant symptom burden with multiple comorbidities.  No increased oxygen demand.  Hold on current prednisone dose. Agree patient has a component of volume overload.  Continue with recommendations for extra gentle diuresis.  Need caution as patient has chronic kidney disease and appears a right chronic hydronephrosis.  Plan  Patient Instructions  Continue on Stiolto 2 puffs daily Continue on Prednisone 71m daily  Continue on Oxygen 2l/m rest and 4l/m with activity .  Take Lasix as directed  Low salt diet  Follow up with Radiation oncology /CT chest as planned ProAir inhaler As needed   Activity as tolerated.  Follow up with Cardiology as discussed.  Follow up in 3 months with Dr. BLamonte Sakai And As needed    Please contact office for sooner follow up if symptoms do not improve or worsen or seek emergency care        Acute on chronic diastolic CHF (congestive heart failure) (HCC) Decompensated diastolic heart failure.  Exam shows 2+ lower extremity edema.  She has no significant increased work of breathing at rest.  O2 saturations are adequate on oxygen.  Agree to proceed with gentle diuresis, continue follow-up with cardiology.  Plan  Patient Instructions  Continue on Stiolto 2 puffs daily Continue on Prednisone 179mdaily  Continue on Oxygen 2l/m rest and 4l/m with activity .  Take Lasix as directed  Low salt diet  Follow up with Radiation oncology /CT chest as planned ProAir inhaler As needed   Activity as tolerated.  Follow up with Cardiology as discussed.  Follow up in 3 months with Dr. ByLamonte SakaiAnd As needed   Please contact office for sooner follow up if symptoms do not improve or worsen or seek emergency care        Chronic respiratory failure with hypoxia (HCArvadaO2 sats are adequate on current regimen.  Continue on 2 L at rest and 4 L with activity.  O2 saturation goal 88 to 90% or greater.  Lung cancer (HCLebanonHypermetabolic right lower lobe lung mass and left upper lobe lung nodule.  She is status post SBRT x5 treatments completed end of December 2021.  Patient is continue follow-up with radiation oncology and serial CT chest.  Pulmonary hypertension, unspecified (HCLedbetterRecent echo showed severe pulmonary hypertension in the setting of chronic lung disease/oxygen dependent.  Patient was unable to do VQ scan during hospitalization.  Venous Dopplers were  negative for DVT. Patient is on anticoagulation for recent a flutter.  Continue on her current regimen, keep O2 saturations greater than 88 to 90%.  And continue with diuresis as tolerated.     Rexene Edison, NP 06/20/2020

## 2020-06-20 NOTE — Assessment & Plan Note (Signed)
Hypermetabolic right lower lobe lung mass and left upper lobe lung nodule.  She is status post SBRT x5 treatments completed end of December 2021.  Patient is continue follow-up with radiation oncology and serial CT chest.

## 2020-06-20 NOTE — Patient Instructions (Signed)
Continue on Stiolto 2 puffs daily Continue on Prednisone 46m daily  Continue on Oxygen 2l/m rest and 4l/m with activity .  Take Lasix as directed  Low salt diet  Follow up with Radiation oncology /CT chest as planned ProAir inhaler As needed   Activity as tolerated.  Follow up with Cardiology as discussed.  Follow up in 3 months with Dr. BLamonte Sakai And As needed   Please contact office for sooner follow up if symptoms do not improve or worsen or seek emergency care

## 2020-06-20 NOTE — Assessment & Plan Note (Signed)
Severe COPD steroid and oxygen dependent.  Patient has significant symptom burden with multiple comorbidities.  No increased oxygen demand.  Hold on current prednisone dose. Agree patient has a component of volume overload.  Continue with recommendations for extra gentle diuresis.  Need caution as patient has chronic kidney disease and appears a right chronic hydronephrosis.  Plan  Patient Instructions  Continue on Stiolto 2 puffs daily Continue on Prednisone 87m daily  Continue on Oxygen 2l/m rest and 4l/m with activity .  Take Lasix as directed  Low salt diet  Follow up with Radiation oncology /CT chest as planned ProAir inhaler As needed   Activity as tolerated.  Follow up with Cardiology as discussed.  Follow up in 3 months with Dr. BLamonte Sakai And As needed   Please contact office for sooner follow up if symptoms do not improve or worsen or seek emergency care

## 2020-06-20 NOTE — Assessment & Plan Note (Signed)
Recent echo showed severe pulmonary hypertension in the setting of chronic lung disease/oxygen dependent.  Patient was unable to do VQ scan during hospitalization.  Venous Dopplers were negative for DVT. Patient is on anticoagulation for recent a flutter.  Continue on her current regimen, keep O2 saturations greater than 88 to 90%.  And continue with diuresis as tolerated.

## 2020-06-20 NOTE — Telephone Encounter (Signed)
Patient granddaughter [name] called stating that patient was having more LE edema with some SOB. Patient is going to Pulm today at 2pm and will be evaluated for SOB there.   Per Herb Grays- I instructed Tonya to increase Furosemide to 60 MG daily from 33m for 2 days only.    Patient granddaughter is aware and verbalized understanding. AS, CMA

## 2020-06-21 ENCOUNTER — Encounter: Payer: Self-pay | Admitting: *Deleted

## 2020-06-21 ENCOUNTER — Other Ambulatory Visit: Payer: Self-pay | Admitting: *Deleted

## 2020-06-21 ENCOUNTER — Ambulatory Visit: Payer: Self-pay | Admitting: *Deleted

## 2020-06-21 NOTE — Patient Outreach (Signed)
[facility]) Care Management  06/21/2020  [name] 09-Oct-1929 [id number]  EMMI-GENERAL DISCHARGE-SUCCESSFUL-RESOLVED RED ON EMMI ALERT Day #1 Date:06/18/2020 Red Alert Reason: DON'T KNOW WHO TO CALL  OUTREACH #2 RN spoke with pt concerning the above emmi. Pt very much aware of who to call is she needs services from her provider. Pt able to mention all upcoming appointments with her primary and CAD with sufficient transportation from her two sons and a supportive granddaughter.   No further needs at this time however RN introduced [facility] services for management of care related to her COPD/HF or HTN. Pt receptive and requested further management of care with her COPD. All other conditions pt states are controlled and managed well with no reported issues.   Will update provider on pt's enrollment and pursue appropriate program and plan of care.  ______________________________________________________  Telephone Assessment Provider to completed Transition of care   RN spoke with pt directly and introduced the [facility] program and services. Discussed her ongoing medication issues and inquired on further assistance with managing some of her ongoing issues. Pt confirms the following:  HF- pt reports her weights are stable at this time with weights around her baseline at 115 lbs and denies any swelling or related issued once educated.  HTN- pt states she takes her BP everyday and today it was 140/64. Pt aware of possible symptoms and what to do if acute but feels this is a condition that is controlled with her ongoing medication. COPD- Pt is unaware of the COPD action plan and what to do if acute symptoms should occur. RN educated pt accordingly on all levels of the COPD action plan as pt remains in the GREEN zone today. All medication review and pt able to recognize which medications are for all the above medical conditions. Pt receptive to enrolling into the [facility] program and has  requested COPD program for ongoing education and monitoring. Will generate a plan of care related and discuss all goals and interventions. Will also discuss pt's responsibility with daily monitoring and triggers to avoid to prevent flare-ups with her COPD (breathing). Pt with a complete understanding as the initial assessment was completed. Care plan discussed in detail along with all prevention measures and interventions.  Will follow up in a few weeks and space pt monthly if she continues to do well. Will send outreach and welcome letter to pt and route today's assess to pt's primary provider on her disposition with [facility] services. No further questions or inquires at this time.   Goals Addressed            This Visit's Progress   . [facility]-Make and Keep All Appointments       Follow Up Date 07/05/2020 Timeframe:  Short-Term Goal Priority:  Medium Start Date:     06/21/2020                        Expected End Date:    08/15/2020                   - ask family or friend for a ride - call to cancel if needed - keep a calendar with prescription refill dates - keep a calendar with appointment dates    Why is this important?    Part of staying healthy is seeing the doctor for follow-up care.   If you forget your appointments, there are some things you can do to stay on track.  Notes:  2/4-Verified all upcoming appointments and sufficient transportation with her sons and extended family if needed. No needed refills and [facility] will send out a calender for more organization with her management of care with HF weights (controlled) and COPD symptoms (currently GREEN action zone)/medications along with HTN monitoring (controlled).    . Track and Manage My Symptoms-COPD       Follow Up Date 10/15/2020 Timeframe:  Long-Range Goal Priority:  Medium Start Date:     06/21/2020                        Expected End Date:     10/15/2020                  - begin a symptom diary - bring symptom diary to all  visits - develop a rescue plan - eliminate symptom triggers at home - follow rescue plan if symptoms flare-up - keep follow-up appointments - use an extra pillow to sleep    Why is this important?    Tracking your symptoms and other information about your health helps your doctor plan your care.   Write down the symptoms, the time of day, what you were doing and what medicine you are taking.   You will soon learn how to manage your symptoms.     Notes:  2/4-Developed action plan for long term goals as pt receptive and will comply with all discussed for his goals and interventions mentioned above. Awareness of all future appointments as confirmed today.    . Track and Manage My Triggers-COPD       Follow Up Date  07/05/2020 Timeframe:  Short-Term Goal Priority:  Medium Start Date:    06/21/2020                         Expected End Date:    08/15/2020                    - avoid second hand smoke - eliminate smoking in my home - identify and avoid work-related triggers - identify and remove indoor air pollutants - limit outdoor activity during cold weather - listen for public air quality announcements every day    Why is this important?    Triggers are activities or things, like tobacco smoke or cold weather, that make your COPD (chronic obstructive pulmonary disease) flare-up.   Knowing these triggers helps you plan how to stay away from them.   When you cannot remove them, you can learn how to manage them.     Notes:  2/4-Discussed the plan of care once again and what to avoid that would risk flare ups and exacerbated episodes. Pt aware of several risk and continue to avoid.        Raina Mina, RN Care Management Coordinator [place] Office [phone]

## 2020-06-24 ENCOUNTER — Encounter: Payer: Self-pay | Admitting: Radiation Oncology

## 2020-06-25 ENCOUNTER — Ambulatory Visit: Payer: Medicare Other | Admitting: Physician Assistant

## 2020-06-27 ENCOUNTER — Ambulatory Visit (INDEPENDENT_AMBULATORY_CARE_PROVIDER_SITE_OTHER): Payer: Medicare Other | Admitting: Physician Assistant

## 2020-06-27 ENCOUNTER — Other Ambulatory Visit: Payer: Self-pay

## 2020-06-27 ENCOUNTER — Telehealth: Payer: Self-pay | Admitting: *Deleted

## 2020-06-27 ENCOUNTER — Encounter: Payer: Self-pay | Admitting: Physician Assistant

## 2020-06-27 ENCOUNTER — Telehealth: Payer: Self-pay | Admitting: Interventional Cardiology

## 2020-06-27 VITALS — BP 92/58 | HR 61 | Temp 98.3°F | Ht 64.0 in | Wt 113.6 lb

## 2020-06-27 DIAGNOSIS — Z09 Encounter for follow-up examination after completed treatment for conditions other than malignant neoplasm: Secondary | ICD-10-CM

## 2020-06-27 DIAGNOSIS — Z7901 Long term (current) use of anticoagulants: Secondary | ICD-10-CM | POA: Diagnosis not present

## 2020-06-27 DIAGNOSIS — J449 Chronic obstructive pulmonary disease, unspecified: Secondary | ICD-10-CM

## 2020-06-27 DIAGNOSIS — J9620 Acute and chronic respiratory failure, unspecified whether with hypoxia or hypercapnia: Secondary | ICD-10-CM | POA: Diagnosis not present

## 2020-06-27 DIAGNOSIS — I10 Essential (primary) hypertension: Secondary | ICD-10-CM

## 2020-06-27 DIAGNOSIS — I4891 Unspecified atrial fibrillation: Secondary | ICD-10-CM

## 2020-06-27 DIAGNOSIS — I5033 Acute on chronic diastolic (congestive) heart failure: Secondary | ICD-10-CM

## 2020-06-27 NOTE — Telephone Encounter (Signed)
Spoke with [name] at pt's PCP office and she said pt in today and BP was fluctuating while she was there.  Pt getting elevated BPs at home.  BP upon arrival at PCP was 140/53.  They rechecked and it dropped to 92/58.  PCP said pt had just minimal swelling in LE and was no longer in flutter.  Scheduled pt to be seen 2/16.  Called pt and made her aware of appt.

## 2020-06-27 NOTE — Telephone Encounter (Signed)
CALLED PATIENT'S GRANDDAUGHTER (TONYA NELSON) TO INFORM OF CT FOR 09-13-20- ARRIVAL TIME- 12:15 PM, NO RESTRICTIONS TO TEST, TEST TO BE @ [facility], LVM FOR A RETURN CAL

## 2020-06-27 NOTE — Telephone Encounter (Signed)
Pt c/o BP issue: STAT if pt c/o blurred vision, one-sided weakness or slurred speech  1. What are your last 5 BP readings? 212/80, 212/92, 182/82, 183/76  2. Are you having any other symptoms (ex. Dizziness, headache, blurred vision, passed out)? SOB when moving around, swelling  3. What is your BP issue? Athena Summerland from [employer] calling to request the patient be seen next week. She states the patient is hypertensive and is having swelling and SOB. She states her swelling is in the lower extremity. She states the patient was recently in the hospital for afib. She states she has SOB when moving around and her oxygen drops down to the 80's with movement. She states her oxygen was 93% today. She states she roomed her about 15- 20 minutes ago in the office so she is rested and not SOB. She states the patient does have pulmonary issues, but they are concerned her symptoms are cardiac. She states to call her at [phone] or message her through epic.

## 2020-06-27 NOTE — Progress Notes (Signed)
Established Patient Office Visit  Subjective:  Patient ID: [name], female    DOB: 05-16-1930  Age: 87 y.o. MRN: [id number]  CC:  Chief Complaint  Patient presents with  . Hospitalization Follow-up    HPI [name] presents for hospitalization follow up.  Patient was admitted for new onset atrial flutter with RVR and acute on chronic diastolic CHF. Reports constant sleepiness since her discharge which has been better the fast few days.  States yesterday had an odd sensation where she could not focus her eyesight which lasted for 2 to 3 minutes.  No recurrent episodes.  Denies chest pain, slurred speech, numbness, confusion or altered mental status.  When laying down feels her heartbeat.  Lower extremity swelling has been stable.  Continues to have shortness of breath usually on exertion.  Does not have shortness of breath at rest.  Has been monitoring blood pressure, pulse, and weight.  BP log: 133/61, 182/82, 139/58, 163/72, 133/55, 180/68, 144/62, 166/74, 146/60, 140/64, 133/57, 145/64, 125/50, 166/70, 176/76, 111/63, 141/60, 173/82, 140/69, 124/58, 187/81, 179/71, 212/93, 154/80  Pulse range: 46-67  Discharge Summary:  Admit date: 06/09/2020 Discharge date: 06/15/2020  Primary Care Provider: [name], PA-C  Primary Cardiologist: Sinclair Grooms, MD  Primary Electrophysiologist:  None   Discharge Diagnoses    Principal Problem:   Atrial flutter with rapid ventricular response [employer]) Active Problems:   Chronic obstructive pulmonary disease (HCC)   Hypertension   S/P aortic valve replacement and aortoplasty   Hypothyroidism; on synthroid   Hyperlipidemia LDL goal <70   Acute on chronic diastolic CHF (congestive heart failure) (HCC)   Acute on chronic respiratory failure (HCC)   Pulmonary hypertension, unspecified (HCC)   AKI (acute kidney injury) ([place])   Lung cancer (HCC)   Chronic anticoagulation    Diagnostic Studies/Procedures    Echo  06/12/20: 1. Left ventricular ejection fraction, by estimation, is 60 to 65%. The  left ventricle has normal function. The left ventricle has no regional  wall motion abnormalities. Left ventricular diastolic parameters are  consistent with Grade II diastolic  dysfunction (pseudonormalization). Elevated left ventricular end-diastolic  pressure.  2. Right ventricular systolic function is mildly reduced. The right  ventricular size is mildly enlarged. There is severely elevated pulmonary  artery systolic pressure.  3. Left atrial size was moderately dilated.  4. Right atrial size was moderately dilated.  5. The mitral valve is abnormal. Trivial mitral valve regurgitation. No  evidence of mitral stenosis.  6. Tricuspid valve regurgitation is moderate.  7. Post 21 mm Medtronic Freestyle valve normal functioning . The aortic  valve has been repaired/replaced. Aortic valve regurgitation is not  visualized. No aortic stenosis is present.  8. The inferior vena cava is dilated in size with >50% respiratory  variability, suggesting right atrial pressure of 8 mmHg.  _____________   Lower Extremity Dopplers 06/13/2020: Summary: BILATERAL:  - No evidence of deep vein thrombosis seen in the lower extremities,  bilaterally.  - No evidence of superficial venous thrombosis in the lower extremities,  bilaterally.  -No evidence of popliteal cyst, bilaterally.   History of Present Illness    [name] is a57 y.o.femalewith a history of aortic stenosis s/p bioprosthetic AVR in 2012, carotid artery disease, COPD/emphysema on 3L of O2 at home, lung cancer, hypertension, hyperlipidemia, hypothyroidism who was admitted on 06/09/2020 for new onset atrial flutter with RVR and acute on chronic diastolic CHF after presenting with progressive shortness of breath and lower  extremity edema.  She reports progressing edema and SOB over the last several days. She denied palpitations.   WBC 9.4 Hgb 13.2  Plt 110 BNP 367 Mg 1.9 Cr 1.2 hstrop 28, delta pending COVID pending CXR stable emphysmatous changes  EKG aflutter with RVR  06/2019 echo LVEF 60-65%, indeterminate DDx, normal LA  Reports recent COVID contact, test is pending here.  She was admitted to cardiology with new onset atrial flutter with RVR.  Hospital Course     Consultants: none  Atrial flutter with RVR - new diagnosis She was started on IV amiodarone. Heparin drip transitioned to DOAC - 2.5 mg eliquis BID for age and weight.  TEE/DCCV was planned, but she converted to sinus rhythm.  IV amiodarone transitioned to PO.  She was doing well in sinus rhythm on PO amiodarone, but this was stopped due to her underlying pulmonary condition and possible amiodarone toxicity. If she reverted back to atrial flutter, restart PO amiodarone. Started on 25 mg toprol.    Need for chronic anticoagulation This patients CHA2DS2-VASc Score and unadjusted Ischemic Stroke Rate (% per year) is equal to 4.8 % stroke rate/year from a score of 4 (4age, female, HTN, carotid artery disease) She is on eliquis 2.5 mg BID. She is doing well without bleeding.   Acute hypoxic and hypercarbic respiratory failure due to COPD exacerbation IM consulted and initiated solu-mdedrol. Pt is maintained on stiolto and prednisone daily at home. She has ambulated and maintained 93% O2 saturation on 2-3L [place] - is on 3 L at baseline. Resume home medications.    Acute on chronic diastolic heart failure Suspect exacerbation likely due to RVR. Repeat echo revealed normal EF 60-65%, grade 2 DD, moderately dilated left and right atria, moderate TR, and no AS. She was diuresed with 40 mg IV lasix x 3 doses.  Resume home lasix of 20 mg daily.    Pulmonary hypertension Moderately reduced RV function VQ scan was recommended, but patient has significant SOB in supine position with anxiety. SOB has resolved with diuresis and solumedrol. Suspect dyspnea due to  the above, lower suspicion for PE.    Hypertension Home 34m imdur and 2.5 mg amlodipine held. May restart at OV if pressure is elevated.   AKI Resolving. Discharge sCr 1.36, down from peak of 1.95.   Lung cancer Oncology on board.    Disposition Pt was weaned to her home O2. PT was consulted and recommended home health PT 3 times weekly. She ambulated with 2-3 L O2.    Past Medical History:  Diagnosis Date  . Allergic rhinitis   . Aortic stenosis   . Carotid artery disease ([place]   . Colon polyps   . Congestive heart failure (CHF) ([place]   . h/o Atrial fibrillation ([place] 03/22/2011   Brief post op   . Hematuria 11/05/2015  . Hypercalcemia   . Hypertension   . Hypothyroid   . Lung cancer, lower lobe ([place]   . Osteopenia   . 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 Pressure urticaria   . RLS (restless legs syndrome)   . S/P aortic valve replacement and aortoplasty 04/13/2011   218mMedtronic Freestyle porcine aortic root   . S/P aortic valve replacement with stentless valve 03/18/2011    Past Surgical History:  Procedure Laterality Date  . AORTIC ROOT REPLACEMENT  03/18/2011   [id number] Freestyle Porcine aortic root with reimplantation of coronary arteries  . BREAST LUMPECTOMY     Left  . CATARACT EXTRACTION    .  CHOLECYSTECTOMY    . FOOT SURGERY     Morton's neuroma  . KNEE ARTHROSCOPY     Left    Family History  Problem Relation Age of Onset  . Cancer Mother        bladder  . Lung cancer Father   . Heart disease Maternal Grandmother   . Emphysema Maternal Aunt   . Asthma Maternal Aunt   . Asthma Maternal Uncle   . Breast cancer Sister   . Uterine cancer Sister     Social History   Socioeconomic History  . Marital status: Widowed    Spouse name: Not on file  . Number of children: N  . Years of education: Not on file  . Highest education level: Not on file  Occupational History  . Occupation: housewife  Tobacco Use  . Smoking status:  Former Smoker    Packs/day: 0.90    Years: 62.00    Pack years: 55.80    Types: Cigarettes    Quit date: 07/14/2010    Years since quitting: 9.9  . Smokeless tobacco: Never Used  Vaping Use  . Vaping Use: Never used  Substance and Sexual Activity  . Alcohol use: No    Alcohol/week: 0.0 standard drinks  . Drug use: No  . Sexual activity: Never  Other Topics Concern  . Not on file  Social History Narrative  . Not on file   Social Determinants of Health   Financial Resource Strain: Not on file  Food Insecurity: No Food Insecurity  . Worried About Charity fundraiser in the Last Year: Never true  . Ran Out of Food in the Last Year: Never true  Transportation Needs: No Transportation Needs  . Lack of Transportation (Medical): No  . Lack of Transportation (Non-Medical): No  Physical Activity: Not on file  Stress: Not on file  Social Connections: Not on file  Intimate Partner Violence: Not on file    Outpatient Medications Prior to Visit  Medication Sig Dispense Refill  . acetaminophen (TYLENOL) 500 MG tablet Take 500 mg by mouth every 6 (six) hours as needed. Take 3 tablets every 6 hours    . albuterol (PROAIR HFA) 108 (90 Base) MCG/ACT inhaler Inhale 2 puffs into the lungs every 6 (six) hours as needed for wheezing or shortness of breath. 18 g 5  . amitriptyline (ELAVIL) 10 MG tablet Take 1 tablet (10 mg total) by mouth at bedtime. 30 tablet 0  . apixaban (ELIQUIS) 2.5 MG TABS tablet Take 1 tablet (2.5 mg total) by mouth 2 (two) times daily. 180 tablet 3  . Cholecalciferol (VITAMIN D-3 PO) Take 1 capsule by mouth daily.    . furosemide (LASIX) 20 MG tablet Take 1 tablet (20 mg total) by mouth 2 (two) times daily. 60 tablet 1  . levothyroxine (SYNTHROID) 112 MCG tablet Take 1 tablet by mouth once daily (Patient taking differently: Take 112 mcg by mouth daily before breakfast.) 90 tablet 1  . Melatonin 10 MG TABS Take 10 mg by mouth at bedtime.    . metoprolol succinate  (TOPROL-XL) 25 MG 24 hr tablet Take 1 tablet (25 mg total) by mouth daily. 90 tablet 3  . Omega-3 Fatty Acids (FISH OIL) 1000 MG CAPS Take 1,000 mg by mouth daily.    . OXYGEN Inhale 2-3 L/min into the lungs See admin instructions. Inhale 2 L/min into the lungs continuously and 3 L/min with any exertion    . predniSONE (STERAPRED UNI-PAK 21 TAB)  10 MG (21) TBPK tablet Take 40 mg (4 tablets) for 1 day, then 30 mg (3 tablets) for 1 day, then 20 mg (2 tablets) for one day, then resume home dose of 10 mg daily. 10 tablet 0  . STIOLTO RESPIMAT 2.5-2.5 MCG/ACT AERS INHALE 2 PUFFS BY MOUTH ONCE DAILY. PLEASE SEE DR FOR REFILLS. (Patient taking differently: Inhale 2 puffs into the lungs daily.) 4 g 12  . predniSONE (DELTASONE) 10 MG tablet Resume following 3 day taper. 30 tablet 1  . simvastatin (ZOCOR) 20 MG tablet Take 1 tablet (20 mg total) by mouth at bedtime. 90 tablet 1   No facility-administered medications prior to visit.    Allergies  Allergen Reactions  . Clarithromycin Other (See Comments)    Made the mouth/throat sore  . Tamsulosin Itching  . Trazodone And Nefazodone Other (See Comments)    Dizziness   . Penicillins Itching and Rash    Did it involve swelling of the face/tongue/throat, SOB, or low BP? No Did it involve sudden or severe rash/hives, skin peeling, or any reaction on the inside of your mouth or nose? Yes Did you need to seek medical attention at a hospital or doctor's office? Yes When did it last happen?Young woman  If all above answers are "NO", may proceed with cephalosporin use.     ROS Review of Systems A fourteen system review of systems was performed and found to be positive as per HPI.    Objective:    Physical Exam General:  Pleasant and cooperative, in no acute distress, has portal oxygen   Neuro:  Alert and oriented,  extra-ocular muscles intact  HEENT:  Normocephalic, atraumatic, neck supple Skin:  no gross rash, warm, pink. Cardiac:  RRR w/o  murmur Respiratory:  Decreased breath sounds, Not using accessory muscles, speaking in full sentences- unlabored. Vascular:  Ext warm, no cyanosis apprec.; Trace edema   Psych:  No HI/SI, judgement and insight good, Euthymic mood. Full Affect.   BP (!) 92/58   Pulse 61   Temp 98.3 F (36.8 C)   Ht _0  (1.626 m)   Wt 113 lb 9.6 oz (51.5 kg)   SpO2 93%   BMI 19.50 kg/m  Wt Readings from Last 3 Encounters:  06/27/20 113 lb 9.6 oz (51.5 kg)  06/20/20 117 lb 9.6 oz (53.3 kg)  06/17/20 117 lb 12.8 oz (53.4 kg)     There are no preventive care reminders to display for this patient.  There are no preventive care reminders to display for this patient.  Lab Results  Component Value Date   TSH 0.984 04/24/2020   Lab Results  Component Value Date   WBC 9.6 06/28/2020   HGB 14.1 06/28/2020   HCT 43.6 06/28/2020   MCV 102 (H) 06/28/2020   PLT 165 06/28/2020   Lab Results  Component Value Date   NA 147 (H) 06/27/2020   K 3.7 06/27/2020   CO2 33 (H) 06/27/2020   GLUCOSE 157 (H) 06/27/2020   BUN 30 (H) 06/27/2020   CREATININE 1.65 (H) 06/27/2020   BILITOT 0.3 06/27/2020   ALKPHOS 52 06/27/2020   AST 19 06/27/2020   ALT 28 06/27/2020   PROT 6.1 06/27/2020   ALBUMIN 3.8 06/27/2020   CALCIUM 9.2 06/27/2020   ANIONGAP 8 06/15/2020   Lab Results  Component Value Date   CHOL 151 03/06/2019   Lab Results  Component Value Date   HDL 81 03/06/2019   Lab Results  Component Value  Date   LDLCALC 54 03/06/2019   Lab Results  Component Value Date   TRIG 85 03/06/2019   Lab Results  Component Value Date   CHOLHDL 1.9 03/06/2019   Lab Results  Component Value Date   HGBA1C 5.5 03/06/2019      Assessment & Plan:   Problem List Items Addressed This Visit      Cardiovascular and Mediastinum   Essential hypertension   Relevant Orders   Comp Met (CMET) (Completed)   CBC w/Diff (Completed)   Atrial fibrillation with RVR (HCC)   Relevant Orders   CBC w/Diff  (Completed)   Acute on chronic diastolic CHF (congestive heart failure) ([place])     Respiratory   Chronic obstructive pulmonary disease ([place])   Relevant Orders   CBC w/Diff (Completed)     Other   Hospital discharge follow-up - Primary   Relevant Orders   CBC w/Diff (Completed)   Comp Met (CMET) (Completed)   CBC w/Diff (Completed)   Chronic anticoagulation   Relevant Orders   CBC w/Diff (Completed)     Hospital discharge follow-up, Atrial fibrillation with RVR, Acute on chronic diastolic CHF: -On exam RRR with no significant volume overload today. -Continue Eliquis and Metoprolol. -Continue Lasix 20 mg BID and will repeat CMP today to monitor renal function and electrolytes. If edema continues to stay stable recommend decreasing Lasix to once daily.  Will repeat CBC w/diff. -Patient's blood pressure today was elevated on intake at 140/53 with pulse 61, BP recheck 94/60, manual recheck 92/48.  Ambulatory blood pressures have been elevated. Amlodipine and isosorbide were discontinued during hospitalization. Due to labile blood pressure today will defer hypertension adjustments to cardiology. Cardiology was contacted and patient's follow up appointment was changed to 07/03/20. -Advised to continue ambulatory monitoring of BP, pulse and weight and keep a log to take to Cardiology appointment. -Follow low sodium diet and continue to monitor fluid intake.  Essential hypertension: -Labile blood pressure today. -Continue Metoprolol 25 mg. -Follow up with cardiology as scheduled.  Chronic obstructive pulmonary disease: -Followed by Pulmonology. -On Stiolto, Albuterol, Prednisone 10 mg daily, and supplemental oxygen.  -SpO2 93% RA w/ supplemental oxygen.    No orders of the defined types were placed in this encounter.   Follow-up: Return in about 3 months (around 09/24/2020) for HTN, edema.   Note:  This note was prepared with assistance of Dragon voice recognition software.  Occasional wrong-word or sound-a-like substitutions may have occurred due to the inherent limitations of voice recognition software.   [name], PA-C

## 2020-06-28 DIAGNOSIS — I4891 Unspecified atrial fibrillation: Secondary | ICD-10-CM | POA: Diagnosis not present

## 2020-06-28 DIAGNOSIS — I10 Essential (primary) hypertension: Secondary | ICD-10-CM | POA: Diagnosis not present

## 2020-06-28 DIAGNOSIS — Z7901 Long term (current) use of anticoagulants: Secondary | ICD-10-CM | POA: Diagnosis not present

## 2020-06-28 DIAGNOSIS — J9620 Acute and chronic respiratory failure, unspecified whether with hypoxia or hypercapnia: Secondary | ICD-10-CM | POA: Diagnosis not present

## 2020-06-28 LAB — COMPREHENSIVE METABOLIC PANEL
ALT: 28 IU/L (ref 0–32)
AST: 19 IU/L (ref 0–40)
Albumin/Globulin Ratio: 1.7 (ref 1.2–2.2)
Albumin: 3.8 g/dL (ref 3.6–4.6)
Alkaline Phosphatase: 52 IU/L (ref 44–121)
BUN/Creatinine Ratio: 18 (ref 12–28)
BUN: 30 mg/dL — ABNORMAL HIGH (ref 8–27)
Bilirubin Total: 0.3 mg/dL (ref 0.0–1.2)
CO2: 33 mmol/L — ABNORMAL HIGH (ref 20–29)
Calcium: 9.2 mg/dL (ref 8.7–10.3)
Chloride: 99 mmol/L (ref 96–106)
Creatinine, Ser: 1.65 mg/dL — ABNORMAL HIGH (ref 0.57–1.00)
GFR calc Af Amer: 32 mL/min/{1.73_m2} — ABNORMAL LOW (ref >59)
GFR calc non Af Amer: 28 mL/min/{1.73_m2} — ABNORMAL LOW (ref >59)
Globulin, Total: 2.3 g/dL (ref 1.5–4.5)
Glucose: 157 mg/dL — ABNORMAL HIGH (ref 65–99)
Potassium: 3.7 mmol/L (ref 3.5–5.2)
Sodium: 147 mmol/L — ABNORMAL HIGH (ref 134–144)
Total Protein: 6.1 g/dL (ref 6.0–8.5)

## 2020-06-28 LAB — CBC WITH DIFFERENTIAL/PLATELET

## 2020-06-29 LAB — CBC WITH DIFFERENTIAL/PLATELET
Basophils Absolute: 0 10*3/uL (ref 0.0–0.2)
Basos: 0 %
EOS (ABSOLUTE): 0.1 10*3/uL (ref 0.0–0.4)
Eos: 1 %
Hematocrit: 43.6 % (ref 34.0–46.6)
Hemoglobin: 14.1 g/dL (ref 11.1–15.9)
Immature Grans (Abs): 0 10*3/uL (ref 0.0–0.1)
Immature Granulocytes: 0 %
Lymphocytes Absolute: 0.6 10*3/uL — ABNORMAL LOW (ref 0.7–3.1)
Lymphs: 6 %
MCH: 32.9 pg (ref 26.6–33.0)
MCHC: 32.3 g/dL (ref 31.5–35.7)
MCV: 102 fL — ABNORMAL HIGH (ref 79–97)
Monocytes Absolute: 0.5 10*3/uL (ref 0.1–0.9)
Monocytes: 6 %
Neutrophils Absolute: 8.3 10*3/uL — ABNORMAL HIGH (ref 1.4–7.0)
Neutrophils: 87 %
Platelets: 165 10*3/uL (ref 150–450)
RBC: 4.28 x10E6/uL (ref 3.77–5.28)
RDW: 12.9 % (ref 11.7–15.4)
WBC: 9.6 10*3/uL (ref 3.4–10.8)

## 2020-07-01 ENCOUNTER — Other Ambulatory Visit: Payer: Self-pay | Admitting: Physician Assistant

## 2020-07-02 ENCOUNTER — Other Ambulatory Visit: Payer: Self-pay | Admitting: Emergency Medicine

## 2020-07-02 DIAGNOSIS — J438 Other emphysema: Secondary | ICD-10-CM

## 2020-07-02 NOTE — Progress Notes (Signed)
Cardiology Office Note   Date:  07/03/2020   ID:  [name], DOB 1929/05/30, MRN [id number]  PCP:  [name], PA-C  Cardiologist:  Dr. Daneen Schick, MD   Chief Complaint  Patient presents with  . Follow-up    History of Present Illness: [name] is a 87 y.o. female who presents for follow-up of labile BP, seen for Dr. Tamala Julian.  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 BNP was found to be elevated at 367.  CXR with emphysematous changes.  EKG showed atrial flutter with RVR.  Echocardiogram showed LVEF at 60 to 65% with indeterminate diastolic function and a normal LA.  On 06/27/20 patient's PCPs office called for an update.  She has been getting elevated BP readings at home.  At the PCPs office BP was 140/53 however on recheck it was 92/58.  She had minimal swelling in lower extremities and was no longer noted to be in atrial flutter.  She presents today for follow-up of LE edema and labile BPs. She seems to be doing well from a CV standpoint. She brings a detailed log of BPs with her. Most AM readings are very uncontrolled however she tells me that these were before her antihypertensives. PM readings are mostly well controlled. There are some above goal. She reports no symptoms with either high pressures or low. No falls or dizziness. Continues with supplemental O2. Stable with that. EKG today shows NSR. No further complaints of palpitations. Her PCP reduced her Lasix to 73m PO QD. She has no swelling. We discussed reducing this to PRN dosing which she agrees. Kidney function was above baseline. Hopeful this will return once off  Lasix.   Past Medical History:  Diagnosis Date  . Allergic rhinitis   . Aortic stenosis   . Carotid artery disease ([facility]   . Colon polyps   . Congestive heart failure (CHF) ([facility]   . h/o Atrial fibrillation ([facility] 03/22/2011   Brief post op   . Hematuria 11/05/2015  . Hypercalcemia   . Hypertension   . Hypothyroid   . Lung cancer, lower lobe ([facility]   . Osteopenia   . 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 Pressure urticaria   . RLS (restless legs syndrome)   . S/P aortic valve replacement and aortoplasty 04/13/2011   26mMedtronic Freestyle porcine aortic root   . S/P aortic valve replacement with stentless valve 03/18/2011    Past Surgical History:  Procedure Laterality Date  . AORTIC ROOT REPLACEMENT  03/18/2011   2148medtronic Freestyle Porcine aortic root with reimplantation of coronary arteries  . BREAST LUMPECTOMY     Left  . CATARACT EXTRACTION    . CHOLECYSTECTOMY    . FOOT SURGERY     Morton's neuroma  . KNEE ARTHROSCOPY     Left     Current Outpatient Medications  Medication Sig Dispense Refill  . acetaminophen (TYLENOL) 500 MG tablet Take 500 mg by mouth every 6 (six) hours as needed. Take 3 tablets every 6 hours    . albuterol (PROAIR HFA) 108 (90 Base) MCG/ACT inhaler Inhale 2 puffs into the lungs every 6 (six) hours as needed for wheezing or shortness  of breath. 18 g 5  . amitriptyline (ELAVIL) 10 MG tablet Take 1 tablet (10 mg total) by mouth at bedtime. 30 tablet 0  . amLODipine (NORVASC) 5 MG tablet Take 1 tablet (5 mg total) by mouth daily. 90 tablet 3  . apixaban (ELIQUIS) 2.5 MG TABS tablet Take 1 tablet (2.5 mg total) by mouth 2 (two) times daily. 180 tablet 3  . Cholecalciferol (VITAMIN D-3 PO) Take 1 capsule by mouth daily.    . furosemide (LASIX) 20 MG tablet Take 1-2 tablets (20-40 mg total) by mouth daily as needed for fluid or edema. 180 tablet 3  . levothyroxine (SYNTHROID) 112 MCG tablet Take 1 tablet by mouth once daily 90 tablet 1  . Melatonin 10  MG TABS Take 10 mg by mouth at bedtime.    . metoprolol succinate (TOPROL-XL) 25 MG 24 hr tablet Take 1 tablet (25 mg total) by mouth daily. 90 tablet 3  . Omega-3 Fatty Acids (FISH OIL) 1000 MG CAPS Take 1,000 mg by mouth daily.    . OXYGEN Inhale 2-3 L/min into the lungs See admin instructions. Inhale 2 L/min into the lungs continuously and 3 L/min with any exertion    . predniSONE (DELTASONE) 10 MG tablet Take 1 tablet by mouth once daily with breakfast 30 tablet 3  . simvastatin (ZOCOR) 20 MG tablet TAKE 1 TABLET BY MOUTH AT BEDTIME 90 tablet 0  . STIOLTO RESPIMAT 2.5-2.5 MCG/ACT AERS INHALE 2 PUFFS BY MOUTH ONCE DAILY. PLEASE SEE DR FOR REFILLS. 4 g 12   No current facility-administered medications for this visit.    Allergies:   Clarithromycin, Tamsulosin, Trazodone and nefazodone, and Penicillins    Social History:  The patient  reports that she quit smoking about 9 years ago. Her smoking use included cigarettes. She has a 55.80 pack-year smoking history. She has never used smokeless tobacco. She reports that she does not drink alcohol and does not use drugs.   Family History:  The patient's family history includes Asthma in her maternal aunt and maternal uncle; Breast cancer in her sister; Cancer in her mother; Emphysema in her maternal aunt; Heart disease in her maternal grandmother; Lung cancer in her father; Uterine cancer in her sister.    ROS:  Please see the history of present illness.   Otherwise, review of systems are positive for none.   All other systems are reviewed and negative.    PHYSICAL EXAM: VS:  BP (!) 112/56   Pulse 60   Ht _0  (1.626 m)   Wt 109 lb 12.8 oz (49.8 kg)   SpO2 94%   BMI 18.85 kg/m  , BMI Body mass index is 18.85 kg/m.   General: Well developed, well nourished, NAD Neck: Negative for carotid bruits. No JVD Lungs:Clear to ausculation bilaterally. Breathing is unlabored. Cardiovascular: RRR with S1 S2. Extremities: No edema. Radial pulses 2+  bilaterally Neuro: Alert and oriented. No focal deficits. No facial asymmetry. MAE spontaneously. Psych: Responds to questions appropriately with normal affect.     EKG:  EKG is ordered today. The ekg ordered today demonstrates NSR HR 60   Recent Labs: 04/24/2020: TSH 0.984 06/09/2020: B Natriuretic Peptide 367.1 06/14/2020: Magnesium 2.3 06/27/2020: ALT 28; BUN 30; Creatinine, Ser 1.65; Potassium 3.7; Sodium 147 06/28/2020: Hemoglobin 14.1; Platelets 165    Lipid Panel    Component Value Date/Time   CHOL 151 03/06/2019 1009   TRIG 85 03/06/2019 1009   HDL 81 03/06/2019 1009   CHOLHDL 1.9  03/06/2019 1009   CHOLHDL 2.1 11/08/2015 1011   VLDL 17 11/08/2015 1011   LDLCALC 54 03/06/2019 1009     Wt Readings from Last 3 Encounters:  07/03/20 109 lb 12.8 oz (49.8 kg)  06/27/20 113 lb 9.6 oz (51.5 kg)  06/20/20 117 lb 9.6 oz (53.3 kg)     Other studies Reviewed: Additional studies/ records that were reviewed today include:  Review of the above records demonstrates:   Echocardiogram 02/26/2015:  - Left ventricle: Abnormal septal motion. The cavity size was  normal. Wall thickness was normal. Systolic function was normal.  The estimated ejection fraction was in the range of 55% to 60%.  Wall motion was normal; there were no regional wall motion  abnormalities. Doppler parameters are consistent with elevated  ventricular end-diastolic filling pressure.  - Aortic valve: Normal appearing tissue AVR. Valve area (VTI): 2.25  cm^2. Valve area (Vmean): 2.26 cm^2.  - Atrial septum: No defect or patent foramen ovale was identified.   Carotid Dopplers 03/09/2019:  1 to 39% bilateral carotid artery stenosis.  Cardiac catheterization 02/12/2011:  CONCLUSION: 1. Critical aortic stenosis, calcific. 2. Essentially widely patent coronary arteries with possible kink versus atherosclerotic obstruction in the distal LAD beyond the second diagonal luminal  irregularities noted in the proximal LAD and proximal RCA. 3. Normal left ventricular function.  ASSESSMENT AND PLAN:  1. Atrial flutter with RVR  (new diagnosis): -Tolerating low dose Eliquis >>EKG with NSR today -Rates stable with Toprol 19m PO QD.  -CHA2DS2-VASc Score and unadjusted Ischemic Stroke Rate (% per year) is equal to 4.8 % stroke rate/year from a score of 4 (2age, female, HTN, carotid artery disease).  2. Chronic hypoxic and hypercarbic respiratory failure due to O2 dependent COPD: -Continue with supplemental O2>>follows with Dr. BLamonte Sakai   3. Chronic diastolic heart failure: -Previously placed on Lasix 242mPO BID>>resumed at hospital d/c>>due to elevated Cr above baseline this was decreased to daily dosing. We will dose PRN for now for LE edema. She appears euvolemic on exam and Cr was elevated at PCP yesterday. Repeat labs today  -Echo with normal EF 60-65%, grade 2 DD, moderately dilated left and right atria, moderate TR, and no AS.  4. Hypertension: -More recently has been having labile BPs. AM pressures elevated however this is prior to morning antihypertensives>>PM reading improved however a little high still. Plan to resume amlodipine 2.5 mg and follow closely   5. CKD Stage IV: -Cr above baseline at PCP follow up. Reduce Lasix to PRN dosing and follow response.  -Repeat labs today AKI -Hospital peak was 1.95  6. Lung CA: -Follows with oncology>>currenlty being treated   7. AVR: -Recent echo>>stable with no AS   Current medicines are reviewed at length with the patient today.  The patient does not have concerns regarding medicines.  The following changes have been made:  Resume amlodipine   Labs/ tests ordered today include: BMET   Orders Placed This Encounter  Procedures  . Basic metabolic panel    Disposition:   FU with APP in 2 weeks  Signed, JiKathyrn DrownNP  07/03/2020 5:43 PM    [facility] [address] [place]  [id number]: [phone]: [phone]

## 2020-07-03 ENCOUNTER — Ambulatory Visit: Payer: Medicare Other | Admitting: Cardiology

## 2020-07-03 ENCOUNTER — Other Ambulatory Visit: Payer: Self-pay

## 2020-07-03 ENCOUNTER — Encounter: Payer: Self-pay | Admitting: Cardiology

## 2020-07-03 VITALS — BP 112/56 | HR 60 | Ht 64.0 in | Wt 109.8 lb

## 2020-07-03 DIAGNOSIS — Z954 Presence of other heart-valve replacement: Secondary | ICD-10-CM

## 2020-07-03 DIAGNOSIS — I480 Paroxysmal atrial fibrillation: Secondary | ICD-10-CM

## 2020-07-03 DIAGNOSIS — I6523 Occlusion and stenosis of bilateral carotid arteries: Secondary | ICD-10-CM

## 2020-07-03 DIAGNOSIS — Z952 Presence of prosthetic heart valve: Secondary | ICD-10-CM | POA: Diagnosis not present

## 2020-07-03 DIAGNOSIS — I272 Pulmonary hypertension, unspecified: Secondary | ICD-10-CM | POA: Diagnosis not present

## 2020-07-03 DIAGNOSIS — I10 Essential (primary) hypertension: Secondary | ICD-10-CM

## 2020-07-03 MED ORDER — AMLODIPINE BESYLATE 5 MG PO TABS: 5.0000 mg | ORAL_TABLET | Freq: Every day | ORAL | 3 refills | Status: DC

## 2020-07-03 MED ORDER — FUROSEMIDE 20 MG PO TABS: 20.0000 mg | ORAL_TABLET | Freq: Every day | ORAL | 3 refills | Status: DC | PRN

## 2020-07-03 NOTE — Patient Instructions (Addendum)
Medication Instructions:  Your physician has recommended you make the following change in your medication:  1. START AMLODIPINE TO 5 MG DAILY.  2. INCREASE LASIX TO 20 - 40 MG AS NEEDED FOR SWELLING  *If you need a refill on your cardiac medications before your next appointment, please call your pharmacy*   Lab Work: TODAY: BMET  If you have labs (blood work) drawn today and your tests are completely normal, you will receive your results only by: Marland Kitchen MyChart Message (if you have MyChart) OR . A paper copy in the mail If you have any lab test that is abnormal or we need to change your treatment, we will call you to review the results.   Testing/Procedures: NONE   Follow-Up: At [facility], you and your health needs are our priority.  As part of our continuing mission to provide you with exceptional heart care, we have created designated Provider Care Teams.  These Care Teams include your primary Cardiologist (physician) and Advanced Practice Providers (APPs -  Physician Assistants and Nurse Practitioners) who all work together to provide you with the care you need, when you need it.  We recommend signing up for the patient portal called "MyChart".  Sign up information is provided on this After Visit Summary.  MyChart is used to connect with patients for Virtual Visits (Telemedicine).  Patients are able to view lab/test results, encounter notes, upcoming appointments, etc.  Non-urgent messages can be sent to your provider as well.   To learn more about what you can do with MyChart, go to [email or web address].    Your next appointment:   Your physician recommends that you keep your scheduled follow-up appointment with Ermalinda Barrios, PA-C

## 2020-07-04 LAB — BASIC METABOLIC PANEL
BUN/Creatinine Ratio: 19 (ref 12–28)
BUN: 24 mg/dL (ref 8–27)
CO2: 31 mmol/L — ABNORMAL HIGH (ref 20–29)
Calcium: 9.1 mg/dL (ref 8.7–10.3)
Chloride: 96 mmol/L (ref 96–106)
Creatinine, Ser: 1.29 mg/dL — ABNORMAL HIGH (ref 0.57–1.00)
GFR calc Af Amer: 43 mL/min/{1.73_m2} — ABNORMAL LOW (ref >59)
GFR calc non Af Amer: 37 mL/min/{1.73_m2} — ABNORMAL LOW (ref >59)
Glucose: 69 mg/dL (ref 65–99)
Potassium: 3.5 mmol/L (ref 3.5–5.2)
Sodium: 144 mmol/L (ref 134–144)

## 2020-07-05 ENCOUNTER — Other Ambulatory Visit: Payer: Self-pay | Admitting: *Deleted

## 2020-07-05 NOTE — Addendum Note (Signed)
Addended by: Briant Cedar on: 07/05/2020 01:04 PM   Modules accepted: Orders

## 2020-07-05 NOTE — Patient Outreach (Signed)
[facility]) Care Management  07/05/2020  [name] [birth year] [id number]  Telephone Assessment-Successful-COPD  RN followed up with pt via 2 weeks from her recent enrollment. Pt continue to be on track with no acute issues that have been encountered. Reviewed and discussed once again the plan of care as pt with full understanding and verified pt continue to be in the GREEN zone with her COPD. Pt currently has used only her controlled inhaler that is scheduled no acute events or episodes have occurred since our last encounter.   Plan of care updated accordingly and RN case manager will follow up next month with an scheduled appointment.    Goals Addressed            This Visit's Progress   . [facility]-Make and Keep All Appointments   On track    Follow Up Date 08/02/2020 Timeframe:  Short-Term Goal Priority:  Medium Start Date:     06/21/2020                        Expected End Date:    08/15/2020                   - ask family or friend for a ride - call to cancel if needed - keep a calendar with prescription refill dates - keep a calendar with appointment dates    Why is this important?    Part of staying healthy is seeing the doctor for follow-up care.   If you forget your appointments, there are some things you can do to stay on track.    Notes:  2/17-Pt continues to be on track with all interventions noted above. Will continue to encouraged adherence. 2/4-Verified all upcoming appointments and sufficient transportation with her sons and extended family if needed. No needed refills and [facility] will send out a calender for more organization with her management of care with HF weights (controlled) and COPD symptoms (currently GREEN action zone)/medications along with HTN monitoring (controlled).    Baker Pierini and Manage My Symptoms-COPD   On track    Follow Up Date 08/02/2020 Timeframe:  Long-Range Goal Priority:  Medium Start Date:     06/21/2020                         Expected End Date:     10/15/2020                  - begin a symptom diary - bring symptom diary to all visits - develop a rescue plan - eliminate symptom triggers at home - follow rescue plan if symptoms flare-up - keep follow-up appointments - use an extra pillow to sleep    Why is this important?    Tracking your symptoms and other information about your health helps your doctor plan your care.   Write down the symptoms, the time of day, what you were doing and what medicine you are taking.   You will soon learn how to manage your symptoms.     Notes:  2/4-Developed action plan for long term goals as pt receptive and will comply with all discussed for his goals and interventions mentioned above. Awareness of all future appointments as confirmed today.    . Track and Manage My Triggers-COPD   On track    Follow Up Date  08/02/2020 Timeframe:  Short-Term Goal Priority:  Medium Start Date:  06/21/2020                         Expected End Date:    08/15/2020                    - avoid second hand smoke - eliminate smoking in my home - identify and avoid work-related triggers - identify and remove indoor air pollutants - limit outdoor activity during cold weather - listen for public air quality announcements every day    Why is this important?    Triggers are activities or things, like tobacco smoke or cold weather, that make your COPD (chronic obstructive pulmonary disease) flare-up.   Knowing these triggers helps you plan how to stay away from them.   When you cannot remove them, you can learn how to manage them.     Notes:  2/18-Reiterated on the same plan of care as pt remains on track with no acute symptoms as discussed. Review the plan of care for pt's knowledge base. 2/4-Discussed the plan of care once again and what to avoid that would risk flare ups and exacerbated episodes. Pt aware of several risk and continue to avoid.        Raina Mina, RN Care  Management Coordinator [place] Office [phone]

## 2020-07-15 DIAGNOSIS — J9611 Chronic respiratory failure with hypoxia: Secondary | ICD-10-CM

## 2020-07-15 NOTE — Progress Notes (Signed)
Cardiology Office Note    Date:  07/17/2020   ID:  [name], DOB Jun 13, 1929, MRN [id number]   PCP:  [name], [place]  Cardiologist:  Renee Grooms, MD  Advanced Practice Provider:  No care team member to display Electrophysiologist:  None   [id number]   Chief Complaint  Patient presents with  . Follow-up    History of Present Illness:  [name] is a 87 y.o. 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 Echocardiogram showed LVEF at 60 to 65% with indeterminate diastolic function and a normal LA.   Patient last saw Ms. Glenford Peers, NP 07/03/2020 for labile blood pressures and lower extremity edema.  She was in normal sinus rhythm that day and tolerating Eliquis and Toprol.  Lasix was reduced to as needed because of decreased edema and elevated creatinine.  Morning blood pressures were elevated prior to taking her medications.  Amlodipine 2.5 mg was resumed.   Patient comes in for f/u. BP is doing better. Hasn't used lasix. She thinks she's taking amlodipine 5 mg 2 tablets daily for a total of 10 mg. Has a hard time focusing her eyes in am and feels foggy in her head and staggers some. Has to take her time when she gets up. BP has been good 115-140 but dizzy when she stands up.    Past Medical History:  Diagnosis Date  . Allergic rhinitis   . Aortic stenosis   . Carotid artery disease ([place])   . Colon polyps   . Congestive heart failure (CHF) ([place])   . h/o Atrial fibrillation ([place]) 03/22/2011   Brief post op   . Hematuria 11/05/2015  . Hypercalcemia   . Hypertension   . Hypothyroid    . Lung cancer, lower lobe ([place])   . Osteopenia   . 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 Pressure urticaria   . RLS (restless legs syndrome)   . S/P aortic valve replacement and aortoplasty 04/13/2011   62m Medtronic Freestyle porcine aortic root   . S/P aortic valve replacement with stentless valve 03/18/2011    Past Surgical History:  Procedure Laterality Date  . AORTIC ROOT REPLACEMENT  03/18/2011   232mMedtronic Freestyle Porcine aortic root with reimplantation of coronary arteries  . BREAST LUMPECTOMY     Left  . CATARACT EXTRACTION    . CHOLECYSTECTOMY    . FOOT SURGERY     Morton's neuroma  . KNEE ARTHROSCOPY     Left    Current Medications: Current Meds  Medication Sig  . acetaminophen (TYLENOL) 500 MG tablet Take 500 mg by mouth every 6 (six) hours as needed. Take 3 tablets every 6 hours  . albuterol (PROAIR HFA) 108 (90 Base) MCG/ACT inhaler Inhale 2 puffs into the lungs every 6 (six) hours as needed for wheezing or shortness of breath.  . Marland Kitchenmitriptyline (ELAVIL) 10 MG tablet Take 1 tablet (10 mg total) by mouth at bedtime.  . Marland Kitchenpixaban (ELIQUIS) 2.5 MG TABS tablet Take 1 tablet (2.5 mg total) by mouth 2 (two) times daily.  . Cholecalciferol (VITAMIN D-3 PO) Take 1 capsule by mouth daily.  . furosemide (LASIX) 20  MG tablet Take 1-2 tablets (20-40 mg total) by mouth daily as needed for fluid or edema.  Marland Kitchen levothyroxine (SYNTHROID) 112 MCG tablet Take 1 tablet by mouth once daily  . Melatonin 10 MG TABS Take 10 mg by mouth at bedtime.  . metoprolol succinate (TOPROL-XL) 25 MG 24 hr tablet Take 1 tablet (25 mg total) by mouth daily.  . Omega-3 Fatty Acids (FISH OIL) 1000 MG CAPS Take 1,000 mg by mouth daily.  . OXYGEN Inhale 2-3 L/min into the lungs See admin instructions. Inhale 2 L/min into the lungs continuously and 3 L/min with any exertion  . predniSONE (DELTASONE) 10 MG tablet Take 1 tablet by mouth once daily with breakfast  . simvastatin (ZOCOR) 20 MG tablet TAKE 1  TABLET BY MOUTH AT BEDTIME  . STIOLTO RESPIMAT 2.5-2.5 MCG/ACT AERS INHALE 2 PUFFS BY MOUTH ONCE DAILY. PLEASE SEE DR FOR REFILLS.  . [DISCONTINUED] amLODipine (NORVASC) 5 MG tablet Take 1 tablet (5 mg total) by mouth daily.     Allergies:   Clarithromycin, Tamsulosin, Trazodone and nefazodone, and Penicillins   Social History   Socioeconomic History  . Marital status: Widowed    Spouse name: Not on file  . Number of children: N  . Years of education: Not on file  . Highest education level: Not on file  Occupational History  . Occupation: housewife  Tobacco Use  . Smoking status: Former Smoker    Packs/day: 0.90    Years: 62.00    Pack years: 55.80    Types: Cigarettes    Quit date: 07/14/2010    Years since quitting: 10.0  . Smokeless tobacco: Never Used  Vaping Use  . Vaping Use: Never used  Substance and Sexual Activity  . Alcohol use: No    Alcohol/week: 0.0 standard drinks  . Drug use: No  . Sexual activity: Never  Other Topics Concern  . Not on file  Social History Narrative  . Not on file   Social Determinants of Health   Financial Resource Strain: Not on file  Food Insecurity: No Food Insecurity  . Worried About Charity fundraiser in the Last Year: Never true  . Ran Out of Food in the Last Year: Never true  Transportation Needs: No Transportation Needs  . Lack of Transportation (Medical): No  . Lack of Transportation (Non-Medical): No  Physical Activity: Not on file  Stress: Not on file  Social Connections: Not on file     Family History:  The patient's family history includes Asthma in her maternal aunt and maternal uncle; Breast cancer in her sister; Cancer in her mother; Emphysema in her maternal aunt; Heart disease in her maternal grandmother; Lung cancer in her father; Uterine cancer in her sister.   ROS:   Please see the history of present illness.    ROS All other systems reviewed and are negative.   PHYSICAL EXAM:   VS:  Ht _0  (1.626 m)    Wt 111 lb 3.2 oz (50.4 kg)   SpO2 (!) 88% Comment: CURRENTLY USING OXYGEN  BMI 19.09 kg/m   Physical Exam  GEN: Thin, in no acute distress  Neck: no JVD, carotid bruits, or masses Cardiac:RRR; no murmurs, rubs, or gallops  Respiratory:  clear to auscultation bilaterally, normal work of breathing GI: soft, nontender, nondistended, + BS Ext: without cyanosis, clubbing, or edema, Good distal pulses bilaterally Neuro:  Alert and Oriented x 3 Psych: euthymic mood, full affect  Wt Readings from Last 3  Encounters:  07/17/20 111 lb 3.2 oz (50.4 kg)  07/03/20 109 lb 12.8 oz (49.8 kg)  06/27/20 113 lb 9.6 oz (51.5 kg)      Studies/Labs Reviewed:   EKG:  EKG is not ordered today.    Recent Labs: 04/24/2020: TSH 0.984 06/09/2020: B Natriuretic Peptide 367.1 06/14/2020: Magnesium 2.3 06/27/2020: ALT 28 06/28/2020: Hemoglobin 14.1; Platelets 165 07/03/2020: BUN 24; Creatinine, Ser 1.29; Potassium 3.5; Sodium 144   Lipid Panel    Component Value Date/Time   CHOL 151 03/06/2019 1009   TRIG 85 03/06/2019 1009   HDL 81 03/06/2019 1009   CHOLHDL 1.9 03/06/2019 1009   CHOLHDL 2.1 11/08/2015 1011   VLDL 17 11/08/2015 1011   LDLCALC 54 03/06/2019 1009    Additional studies/ records that were reviewed today include:  Echocardiogram 02/26/2015:  - Left ventricle: Abnormal septal motion. The cavity size was    normal. Wall thickness was normal. Systolic function was normal.    The estimated ejection fraction was in the range of 55% to 60%.    Wall motion was normal; there were no regional wall motion    abnormalities. Doppler parameters are consistent with elevated    ventricular end-diastolic filling pressure.  - Aortic valve: Normal appearing tissue AVR. Valve area (VTI): 2.25    cm^2. Valve area (Vmean): 2.26 cm^2.  - Atrial septum: No defect or patent foramen ovale was identified.    Carotid Dopplers 03/09/2019:   1 to 39% bilateral carotid artery stenosis.   Cardiac  catheterization 02/12/2011:   CONCLUSION: 1. Critical aortic stenosis, calcific. 2. Essentially widely patent coronary arteries with possible kink     versus atherosclerotic obstruction in the distal LAD beyond the     second diagonal luminal irregularities noted in the proximal LAD     and proximal RCA. 3. Normal left ventricular function.      Risk Assessment/Calculations:         ASSESSMENT:    1. Atrial flutter, unspecified type ([place])   2. Chronic obstructive pulmonary disease, unspecified COPD type ([place])   3. Chronic diastolic CHF (congestive heart failure) ([place])   4. Essential hypertension   5. S/P aortic valve replacement and aortoplasty   6. CKD (chronic kidney disease) stage 4, GFR 15-29 ml/min (HCC)   7. History of lung cancer      PLAN:  In order of problems listed above:  Atrial flutter with RVR on Eliquis and Toprol in normal sinus rhythm last office visit CHA2DS2-VASc equals 4. No bleeding problems on eliquis  Chronic hypoxic and hypercarbic respiratory failure due to COPD O2 dependent  Chronic diastolic CHF now on as needed Lasix because of rising creatinine echo 05/2020 normal LVEF 60 to 65% with grade 2 DD  Hypertension BP was up and amlodipine resumed at 2.5 mg daily but patient taking 10 mg and orthostatic with BP dropping to 80/60. Will decrease amlodipine 2.5 mg daily and hopefully symptoms will resolve. She'll call us in 2 weeks with update and BP readings. Orthostatic precautions reviewed with her  AVR static in 2012 recent echo without AS  CKD stage IV-Crt 1.29 07/03/20 improved  Lung CA  Shared Decision Making/Informed Consent        Medication Adjustments/Labs and Tests Ordered: Current medicines are reviewed at length with the patient today.  Concerns regarding medicines are outlined above.  Medication changes, Labs and Tests ordered today are listed in the Patient Instructions below. Patient Instructions  Medication Instructions:   Your  physician  has recommended you make the following change in your medication:   DECREASE: Amlodipine to 2.29m daily  *If you need a refill on your cardiac medications before your next appointment, please call your pharmacy*   Lab Work: None today If you have labs (blood work) drawn today and your tests are completely normal, you will receive your results only by: .Marland KitchenMyChart Message (if you have MyChart) OR . A paper copy in the mail If you have any lab test that is abnormal or we need to change your treatment, we will call you to review the results.   Follow-Up: [facility] you and your health needs are our priority.  As part of our continuing mission to provide you with exceptional heart care, we have created designated Provider Care Teams.  These Care Teams include your primary Cardiologist (physician) and Advanced Practice Providers (APPs -  Physician Assistants and Nurse Practitioners) who all work together to provide you with the care you need, when you need it.  Your next appointment:   As scheduled with Dr STamala Julian Other Instructions Keep a check of your Blood Pressure and call uKoreawith the readings in 2 weeks.     SSumner Boast PA-C  07/17/2020 11:34 AM    [facility] [address] [place]  [id number]: [phone] Fax: [phone]

## 2020-07-15 NOTE — Telephone Encounter (Signed)
Please advise on patient mychart message  I need some help please.  I am in need of a new battery for my Inogen portable concentrator.  I have contacted my insurance company and been informed that they will pay 80% of the cost of either a new battery or a new unit.  I can only get this thru [employer] however.  I already have an account with them for my home concentrator, however I cannot get in touch with them for this new issue.  I have left multiple messages for them with no response.  I also will need a prescription for this item sent to [employer] in order for the insurance company to pay for it.  Please note also that my oxygen usage has been increased recently with exertion, therefore my new prescription will need to reflect this updated information.  Thank you so much for you assistance!

## 2020-07-16 DIAGNOSIS — J449 Chronic obstructive pulmonary disease, unspecified: Secondary | ICD-10-CM | POA: Diagnosis not present

## 2020-07-16 DIAGNOSIS — J438 Other emphysema: Secondary | ICD-10-CM | POA: Diagnosis not present

## 2020-07-17 ENCOUNTER — Other Ambulatory Visit: Payer: Self-pay

## 2020-07-17 ENCOUNTER — Encounter: Payer: Self-pay | Admitting: Physician Assistant

## 2020-07-17 ENCOUNTER — Ambulatory Visit: Payer: Medicare Other | Admitting: Physician Assistant

## 2020-07-17 VITALS — Ht 64.0 in | Wt 111.2 lb

## 2020-07-17 DIAGNOSIS — Z85118 Personal history of other malignant neoplasm of bronchus and lung: Secondary | ICD-10-CM

## 2020-07-17 DIAGNOSIS — N184 Chronic kidney disease, stage 4 (severe): Secondary | ICD-10-CM

## 2020-07-17 DIAGNOSIS — Z952 Presence of prosthetic heart valve: Secondary | ICD-10-CM

## 2020-07-17 DIAGNOSIS — I5032 Chronic diastolic (congestive) heart failure: Secondary | ICD-10-CM

## 2020-07-17 DIAGNOSIS — I4892 Unspecified atrial flutter: Secondary | ICD-10-CM

## 2020-07-17 DIAGNOSIS — I10 Essential (primary) hypertension: Secondary | ICD-10-CM | POA: Diagnosis not present

## 2020-07-17 DIAGNOSIS — J449 Chronic obstructive pulmonary disease, unspecified: Secondary | ICD-10-CM

## 2020-07-17 MED ORDER — AMLODIPINE BESYLATE 5 MG PO TABS: 2.5000 mg | ORAL_TABLET | Freq: Every day | ORAL | 3 refills | Status: AC

## 2020-07-17 NOTE — Patient Instructions (Signed)
Medication Instructions:   Your physician has recommended you make the following change in your medication:   DECREASE: Amlodipine to 2.74m daily  *If you need a refill on your cardiac medications before your next appointment, please call your pharmacy*   Lab Work: None today If you have labs (blood work) drawn today and your tests are completely normal, you will receive your results only by: .Marland KitchenMyChart Message (if you have MyChart) OR . A paper copy in the mail If you have any lab test that is abnormal or we need to change your treatment, we will call you to review the results.   Follow-Up: [facility] and your [facility] needs are our priority.  As part of our continuing mission to provide you with exceptional heart care, we have created designated Provider Care Teams.  These Care Teams include your primary Cardiologist (physician) and Advanced Practice Providers (APPs -  Physician Assistants and Nurse Practitioners) who all work together to provide you with the care you need, when you need it.  Your next appointment:   As scheduled with Dr STamala Julian Other Instructions Keep a check of your Blood Pressure and call uKoreawith the readings in 2 weeks.

## 2020-07-17 NOTE — Telephone Encounter (Signed)
Please order either a new battery or new system (whichever she prefers) through her DME. Find out how much O2 she is using so we can write updated script and flow rate.

## 2020-07-23 ENCOUNTER — Encounter: Payer: Self-pay | Admitting: Physician Assistant

## 2020-08-02 ENCOUNTER — Other Ambulatory Visit: Payer: Self-pay | Admitting: *Deleted

## 2020-08-02 NOTE — Patient Outreach (Signed)
[place] [facility]) Care Management  08/02/2020  [name] 1929/12/04 [id number]   Telephone Assessment-Unsuccessful  RN attempted outreach call today however unsuccessful. RN able to leave a HIPAA approved voice message requesting a call back.  Will scheduled another outreach all over the next week for ongoing [facility] services.  Raina Mina, RN Care Management Coordinator [place] Office [phone]

## 2020-08-08 ENCOUNTER — Other Ambulatory Visit: Payer: Self-pay | Admitting: *Deleted

## 2020-08-08 NOTE — Patient Outreach (Signed)
[place] [facility]) Care Management  08/08/2020  [name] 22-Nov-1929 [id number]   Telephone Assessment (2 week follow-up)  RN spoke with pt today and inquired on her ongoing management of care. Pt reports she is doing well with no acute events. Pt express her participation with her church events and continue to have support from her family when needed. Pt remains very independent, home alone and mobile with her daily routine.  Reviewed and discussed the care plan with noted updates. Verified pt received the [facility] packet with COPD information and education. Continue to verify pt remains in the GREEN zone with no acute events or episodes.   Will follow up next month with ongoing COPD care management services and continue to offer assistance for social work and pharmacy availability if needed for any ongoing issues.   Goals Addressed            This Visit's Progress   . [facility]-Make and Keep All Appointments   On track    Follow Up Date 09/06/2020 Timeframe:  Short-Term Goal Priority:  Medium Start Date:     06/21/2020                        Expected End Date:    09/13/2020                   - ask family or friend for a ride - call to cancel if needed - keep a calendar with prescription refill dates - keep a calendar with appointment dates    Why is this important?    Part of staying healthy is seeing the doctor for follow-up care.   If you forget your appointments, there are some things you can do to stay on track.    Notes:  3/24- Verified pt continue to attends all medical appointments with no delays. Pt has supportive family and a church family to assist if needed. Will extend to allow ongoing adherence with this goal.  2/17-Pt continues to be on track with all interventions noted above. Will continue to encouraged adherence. 2/4-Verified all upcoming appointments and sufficient transportation with her sons and extended family if needed. No needed refills and [facility]  will send out a calender for more organization with her management of care with HF weights (controlled) and COPD symptoms (currently GREEN action zone)/medications along with HTN monitoring (controlled).    Baker Pierini and Manage My Symptoms-COPD   On track    Follow Up Date 09/06/2020 Timeframe:  Long-Range Goal Priority:  Medium Start Date:     06/21/2020                        Expected End Date:     10/15/2020                  - begin a symptom diary - bring symptom diary to all visits - develop a rescue plan - eliminate symptom triggers at home - follow rescue plan if symptoms flare-up - keep follow-up appointments - use an extra pillow to sleep    Why is this important?    Tracking your symptoms and other information about your health helps your doctor plan your care.   Write down the symptoms, the time of day, what you were doing and what medicine you are taking.   You will soon learn how to manage your symptoms.     Notes:  2/4-Developed action  plan for long term goals as pt receptive and will comply with all discussed for his goals and interventions mentioned above. Awareness of all future appointments as confirmed today.    . Track and Manage My Triggers-COPD   On track    Follow Up Date  09/06/2020 Timeframe:  Short-Term Goal Priority:  Medium Start Date:    06/21/2020                         Expected End Date:    09/13/2020                    - avoid second hand smoke - eliminate smoking in my home - identify and avoid work-related triggers - identify and remove indoor air pollutants - limit outdoor activity during cold weather - listen for public air quality announcements every day    Why is this important?    Triggers are activities or things, like tobacco smoke or cold weather, that make your COPD (chronic obstructive pulmonary disease) flare-up.   Knowing these triggers helps you plan how to stay away from them.   When you cannot remove them, you can learn how  to manage them.     Notes:  3/22-Pt verifies she continues to do well with no acute symptoms since the last conversation and pt continue to be safe in her home.  Supportive assistance from her family when needed. Pt received the [facility] packet as RN encouraged pt to review the COPD tab for ongoing education and reiterated on the importance of managing this condition.  2/18-Reiterated on the same plan of care as pt remains on track with no acute symptoms as discussed. Review the plan of care for pt's knowledge base. 2/4-Discussed the plan of care once again and what to avoid that would risk flare ups and exacerbated episodes. Pt aware of several risk and continue to avoid.        Raina Mina, RN Care Management Coordinator [place] Office [phone]

## 2020-09-03 ENCOUNTER — Other Ambulatory Visit: Payer: Self-pay | Admitting: *Deleted

## 2020-09-03 NOTE — Patient Outreach (Signed)
[place] [facility]) Care Management  09/03/2020  [name] [birth year] [id number]   Telephone Assessment-Unsuccessful  RN attempted outreach call today however unsuccessful. RN unable to leave a message at this time.  Will attempted another outreach call over the next week.  Raina Mina, RN Care Management Coordinator [place] Office [phone]

## 2020-09-05 ENCOUNTER — Encounter: Payer: Self-pay | Admitting: Radiation Oncology

## 2020-09-06 ENCOUNTER — Ambulatory Visit: Payer: Self-pay | Admitting: *Deleted

## 2020-09-09 ENCOUNTER — Encounter: Payer: Self-pay | Admitting: Radiology

## 2020-09-10 ENCOUNTER — Other Ambulatory Visit: Payer: Self-pay | Admitting: *Deleted

## 2020-09-10 NOTE — Patient Outreach (Signed)
[facility]) Care Management  09/10/2020  [name] 03/17/1930 [id number]   Telephone Assessment-Successful-COPD  RN spoke with pt today and received an update on her ongoing management of care. Pt indicates over the pass few weeks issues with her breathing. States she has permission to take extra  Lasix and took 2 pills (40 mg) over a few days with good results. Today no additional swelling and she is able to breath much better. States these issues happen very rarely so her provider has permitted her to take again the extra Lasix as needed not a daily dosage. States she remains on 3 liters at this time. Pt denies any issues with her COPD and continue to take her inhalers when needed on her ProAir, again rarely used.   Review and discussed the current plan of care with noted updates within the plan. Pt states she continue to do well and has ongoing transportation provided by her family along with all her medications. No additional needs at this time.  Will follow up next month with pt's ongoing management of care and continue communication with her provider on her progress with [facility].  Goals Addressed            This Visit's Progress   . COMPLETED: [facility]-Make and Keep All Appointments       Follow Up Date 09/06/2020 Timeframe:  Short-Term Goal Priority:  Medium Start Date:     06/21/2020                        Expected End Date:    09/13/2020                   - ask family or friend for a ride - call to cancel if needed - keep a calendar with prescription refill dates - keep a calendar with appointment dates    Why is this important?    Part of staying healthy is seeing the doctor for follow-up care.   If you forget your appointments, there are some things you can do to stay on track.    Notes:  3/24- Verified pt continue to attends all medical appointments with no delays. Pt has supportive family and a church family to assist if needed. Will extend to allow  ongoing adherence with this goal.  2/17-Pt continues to be on track with all interventions noted above. Will continue to encouraged adherence. 2/4-Verified all upcoming appointments and sufficient transportation with her sons and extended family if needed. No needed refills and [facility] will send out a calender for more organization with her management of care with HF weights (controlled) and COPD symptoms (currently GREEN action zone)/medications along with HTN monitoring (controlled).    Baker Pierini and Manage My Symptoms-COPD   On track    Follow Up Date 10/10/2020 Timeframe:  Long-Range Goal Priority:  Medium Start Date:     06/21/2020                        Expected End Date:     10/15/2020                  - begin a symptom diary - bring symptom diary to all visits - develop a rescue plan - eliminate symptom triggers at home - follow rescue plan if symptoms flare-up - keep follow-up appointments - use an extra pillow to sleep    Why is this important?  Tracking your symptoms and other information about your health helps your doctor plan your care.   Write down the symptoms, the time of day, what you were doing and what medicine you are taking.   You will soon learn how to manage your symptoms.     Notes:  2/4-Developed action plan for long term goals as pt receptive and will comply with all discussed for his goals and interventions mentioned above. Awareness of all future appointments as confirmed today.    Baker Pierini and Manage My Triggers-COPD   On track    Follow Up Date  10/10/2020 Timeframe:  Short-Term Goal Priority:  Medium Start Date:    06/21/2020                         Expected End Date:    10/15/2020                    - avoid second hand smoke - eliminate smoking in my home - identify and avoid work-related triggers - identify and remove indoor air pollutants - limit outdoor activity during cold weather - listen for public air quality announcements every day     Why is this important?    Triggers are activities or things, like tobacco smoke or cold weather, that make your COPD (chronic obstructive pulmonary disease) flare-up.   Knowing these triggers helps you plan how to stay away from them.   When you cannot remove them, you can learn how to manage them.     Notes:  4/26-Pt reports issues with her breathing at times however resolved with doses of Laxis permitted by her pulmonologist. Pt takes 2 pills (40 mg) for a few days and remains on her 3 liters of home O2. Denies any swelling today and however limited with her mobility she reports no additional SOB today. Pt aware she is able to wean to 1 dose of Laxis if needed and 4 liters due to the long tubing for full oxygenation however to check with her provider provider to long term usage of the 4 liters. Pt is in the GREEN zone today without symptoms. 3/22-Pt verifies she continues to do well with no acute symptoms since the last conversation and pt continue to be safe in her home.  Supportive assistance from her family when needed. Pt received the [facility] packet as RN encouraged pt to review the COPD tab for ongoing education and reiterated on the importance of managing this condition.  2/18-Reiterated on the same plan of care as pt remains on track with no acute symptoms as discussed. Review the plan of care for pt's knowledge base. 2/4-Discussed the plan of care once again and what to avoid that would risk flare ups and exacerbated episodes. Pt aware of several risk and continue to avoid.        Raina Mina, RN Care Management Coordinator [place] Office [phone]

## 2020-09-13 ENCOUNTER — Encounter (HOSPITAL_COMMUNITY): Payer: Self-pay

## 2020-09-13 ENCOUNTER — Ambulatory Visit (HOSPITAL_COMMUNITY)
Admission: RE | Admit: 2020-09-13 | Discharge: 2020-09-13 | Disposition: A | Discharge status: Home / Self Care | Payer: Medicare Other | Source: Ambulatory Visit | Attending: Radiation Oncology | Admitting: Radiation Oncology

## 2020-09-13 ENCOUNTER — Other Ambulatory Visit: Payer: Self-pay

## 2020-09-13 DIAGNOSIS — C3490 Malignant neoplasm of unspecified part of unspecified bronchus or lung: Secondary | ICD-10-CM | POA: Diagnosis not present

## 2020-09-13 DIAGNOSIS — M4854XA Collapsed vertebra, not elsewhere classified, thoracic region, initial encounter for fracture: Secondary | ICD-10-CM | POA: Diagnosis not present

## 2020-09-13 DIAGNOSIS — M40204 Unspecified kyphosis, thoracic region: Secondary | ICD-10-CM | POA: Diagnosis not present

## 2020-09-13 DIAGNOSIS — R911 Solitary pulmonary nodule: Secondary | ICD-10-CM | POA: Diagnosis not present

## 2020-09-13 DIAGNOSIS — J438 Other emphysema: Secondary | ICD-10-CM | POA: Diagnosis not present

## 2020-09-15 NOTE — Progress Notes (Signed)
Radiation Oncology         [phone]) [phone] ________________________________  Name: [name] MRN: [id number]  Date: 09/16/2020  DOB: Aug 19, 1929  Follow-Up Visit Note  CC: [name], PA-C  Collene Gobble, MD    ICD-10-CM   1. Malignant neoplasm of lower lobe of right lung [facility])  C34.31     Diagnosis: PET-avid right lower lobe lung nodule consistent with primary bronchogenic carcinoma; possible left upper lobe synchronous bronchogenic carcinoma  Interval Since Last Radiation: Four months and three days  Radiation Treatment Dates: 05/02/2020 through 05/15/2020  Site / Technique / Total Dose (Gy) / Dose per Fx (Gy) / Completed Fx / Beam Energies Right lung; IMRT. A total of 60 Gy given at 12 Gy per fraction for a total of 5 fractions. 6XFFF beam energy.  Narrative:  The patient returns today for routine follow-up. Recent chest CT scan on 09/13/2020 showed suspected post-radiation changes in the parenchyma anterior to the right lower lobe nodule, which had decreased in size in the interval. There were no new or progressive findings. The small left lower lobe nodule had essentially resolved; there was only minimal architectural distortion without visible residual lesion.  On review of systems, she reports no new medical issues she does feel her breathing is slowly worsened mains on 2 L of oxygen at rest.  Occasionally she will use 3 L with exertion. She denies chest pain cough or hemoptysis.       ALLERGIES:  is allergic to clarithromycin, tamsulosin, trazodone and nefazodone, and penicillins.  Meds: Current Outpatient Medications  Medication Sig Dispense Refill  . acetaminophen (TYLENOL) 500 MG tablet Take 500 mg by mouth every 6 (six) hours as needed. Take 3 tablets every 6 hours    . albuterol (PROAIR HFA) 108 (90 Base) MCG/ACT inhaler Inhale 2 puffs into the lungs every 6 (six) hours as needed for wheezing or shortness of breath. 18 g 5  . apixaban (ELIQUIS) 2.5 MG TABS  tablet Take 1 tablet (2.5 mg total) by mouth 2 (two) times daily. 180 tablet 3  . furosemide (LASIX) 20 MG tablet Take 1-2 tablets (20-40 mg total) by mouth daily as needed for fluid or edema. 180 tablet 3  . levothyroxine (SYNTHROID) 112 MCG tablet Take 1 tablet by mouth once daily 90 tablet 1  . Melatonin 10 MG TABS Take 10 mg by mouth at bedtime.    . metoprolol succinate (TOPROL-XL) 25 MG 24 hr tablet Take 1 tablet (25 mg total) by mouth daily. 90 tablet 3  . Omega-3 Fatty Acids (FISH OIL) 1000 MG CAPS Take 1,000 mg by mouth daily.    . OXYGEN Inhale 2-3 L/min into the lungs See admin instructions. Inhale 2 L/min into the lungs continuously and 3 L/min with any exertion    . predniSONE (DELTASONE) 10 MG tablet Take 1 tablet by mouth once daily with breakfast 30 tablet 3  . simvastatin (ZOCOR) 20 MG tablet TAKE 1 TABLET BY MOUTH AT BEDTIME 90 tablet 0  . STIOLTO RESPIMAT 2.5-2.5 MCG/ACT AERS INHALE 2 PUFFS BY MOUTH ONCE DAILY. PLEASE SEE DR FOR REFILLS. 4 g 12  . amitriptyline (ELAVIL) 10 MG tablet Take 1 tablet (10 mg total) by mouth at bedtime. 30 tablet 0  . amLODipine (NORVASC) 5 MG tablet Take 0.5 tablets (2.5 mg total) by mouth daily. 90 tablet 3  . Cholecalciferol (VITAMIN D-3 PO) Take 1 capsule by mouth daily. (Patient not taking: Reported on 09/16/2020)     No  current facility-administered medications for this encounter.    Physical Findings: The patient is in no acute distress. Patient is alert and oriented.  height is 5' 4" (1.626 m) and weight is 113 lb (51.3 kg). Her temporal temperature is 96.8 F (36 C) (abnormal). Her blood pressure is 149/62 (abnormal) and her pulse is 69. Her respiration is 20 and oxygen saturation is 95%.  She is on supplemental oxygen 2 to 3 L Lungs are clear to auscultation bilaterally. Heart has regular rhythm with decreased rate. No palpable cervical, supraclavicular, or axillary adenopathy. Abdomen soft, non-tender, normal bowel sounds.   Lab  Findings: Lab Results  Component Value Date   WBC 9.6 06/28/2020   HGB 14.1 06/28/2020   HCT 43.6 06/28/2020   MCV 102 (H) 06/28/2020   PLT 165 06/28/2020    Radiographic Findings: CT CHEST WO CONTRAST  Result Date: 09/13/2020 CLINICAL DATA:  Non-small cell lung cancer, not metastatic assess treatment response in a 87 year old female EXAM: CT CHEST WITHOUT CONTRAST TECHNIQUE: Multidetector CT imaging of the chest was performed following the standard protocol without IV contrast. COMPARISON:  PET exam from October of 2021. CT evaluation from September of 2021 FINDINGS: Cardiovascular: Calcification of the thoracic aorta with similar appearance. Dilation of the aortic root similar to prior exam. Central pulmonary vasculature of normal caliber. Heart size is normal with extensive coronary artery disease and signs of coronary revascularization. No pericardial effusion. Limited assessment of cardiovascular structures given lack of intravenous contrast. Mediastinum/Nodes: Mildly patulous esophagus. No axillary, mediastinal or gross hilar lymphadenopathy. Lungs/Pleura: Marked pulmonary emphysema. Interval development of ground-glass and septal thickening along the inferior aspect of the RIGHT upper lobe in this same superior to inferior level as the previously identified nodule in the RIGHT lower lobe superior segment. Image 67/7 2.5 x 1.9 cm nodule in the medial aspect of the superior segment of the RIGHT lower lobe previously measured approximately 3.0 x 2.8 cm. Decreased contact with the pleural surface along the medial RIGHT chest wall. Septal thickening in the RIGHT upper lobe and thickening along the fissure in concert with the ground-glass that is demonstrated anterior to the lesion in the chest. Pleural thickening at the LEFT lung base with calcification likely related to prior surgery and unchanged. Small LEFT upper lobe nodule is essentially resolved, in the area of this nodule in the LEFT upper lobe  there is only minimal architectural distortion without visible residual lesion. Airways are patent. Basilar atelectasis. Upper Abdomen: Incidental imaging of upper abdominal contents unremarkable. Imaged portions the liver, pancreas, spleen, adrenal glands and kidneys is unremarkable. No upper abdominal retroperitoneal adenopathy. Musculoskeletal: Midthoracic compression fractures and associated kyphosis unchanged. These involve vertebral levels T4 through T8 as before. IMPRESSION: 1. Suspected post radiation changes in the parenchyma anterior to the RIGHT lower lobe nodule which has decreased in size in the interval. No new or progressive findings. Attention to areas along the fissure where there is some fissural thickening associated with ground-glass and septal thickening on subsequent imaging. 2. Small LEFT upper lobe nodule is essentially resolved, in the area of this nodule in the LEFT upper lobe there is only minimal architectural distortion without visible residual lesion. 3. Marked pulmonary emphysema. 4. Midthoracic compression fractures and associated kyphosis as before. 5. Emphysema and aortic atherosclerosis. Aortic Atherosclerosis (ICD10-I70.0) and Emphysema (ICD10-J43.9). Electronically Signed   By: Zetta Bills M.D.   On: 09/13/2020 17:08    Impression: PET-avid right lower lobe lung nodule consistent with primary bronchogenic carcinoma; possible left  upper lobe synchronous bronchogenic carcinoma  No evidence of recurrence on clinical exam today. Recent chest CT scan showed suspected post-radiation changes in the parenchyma anterior to the right lower lobe nodule, which had decreased in size in the interval. There were no new or progressive findings. The small left lower lobe nodule had essentially resolved; there was only minimal architectural distortion without visible residual lesion.  Plan: The patient will follow up with radiation oncology in 6 months. She will undergo a chest CT scan  prior to that visit.  Total time spent in this encounter was 15 minutes which included reviewing the patient's most recent chest CT scan, physical examination, ordering of future chest CT scan, and documentation.  ____________________________________   Blair Promise, PhD, MD  This document serves as a record of services personally performed by Gery Pray, MD. It was created on his behalf by Clerance Lav, a trained medical scribe. The creation of this record is based on the scribe's personal observations and the provider's statements to them. This document has been checked and approved by the attending provider.

## 2020-09-16 ENCOUNTER — Encounter: Payer: Self-pay | Admitting: Radiation Oncology

## 2020-09-16 ENCOUNTER — Ambulatory Visit
Admission: RE | Admit: 2020-09-16 | Discharge: 2020-09-16 | Disposition: A | Discharge status: Home / Self Care | Payer: Medicare Other | Source: Ambulatory Visit | Attending: Radiation Oncology | Admitting: Radiation Oncology

## 2020-09-16 ENCOUNTER — Other Ambulatory Visit: Payer: Self-pay

## 2020-09-16 DIAGNOSIS — Z08 Encounter for follow-up examination after completed treatment for malignant neoplasm: Secondary | ICD-10-CM | POA: Diagnosis not present

## 2020-09-16 DIAGNOSIS — J439 Emphysema, unspecified: Secondary | ICD-10-CM | POA: Insufficient documentation

## 2020-09-16 DIAGNOSIS — Z923 Personal history of irradiation: Secondary | ICD-10-CM | POA: Insufficient documentation

## 2020-09-16 DIAGNOSIS — Z85118 Personal history of other malignant neoplasm of bronchus and lung: Secondary | ICD-10-CM | POA: Diagnosis not present

## 2020-09-16 DIAGNOSIS — Z79899 Other long term (current) drug therapy: Secondary | ICD-10-CM | POA: Diagnosis not present

## 2020-09-16 DIAGNOSIS — R911 Solitary pulmonary nodule: Secondary | ICD-10-CM | POA: Diagnosis not present

## 2020-09-16 DIAGNOSIS — C3431 Malignant neoplasm of lower lobe, right bronchus or lung: Secondary | ICD-10-CM

## 2020-09-16 DIAGNOSIS — Z7901 Long term (current) use of anticoagulants: Secondary | ICD-10-CM | POA: Insufficient documentation

## 2020-09-16 HISTORY — DX: Personal history of irradiation: Z92.3

## 2020-09-16 NOTE — Progress Notes (Signed)
[name] is here today for follow up post radiation to the lung.  Lung Side:right   Does the patient complain of any of the following: . Pain:Patient denies pain. Marland Kitchen Shortness of breath w/wo exertion:  Patient reports SOB on exertion, currently on  Oxygen 2 liters  Via nasal cannula at rest and 3 liters via face mask on exertion.  . Cough: no . Hemoptysis:  . Pain with swallowing: no . Swallowing/choking concerns: Patient reports feeling like something is stuck in chest when eating.  Marland Kitchen Appetite: Good . Energy Level: Low energy  . Post radiation skin Changes: no changes     Additional comments if applicable: Vitals:   66/59/93 1444  BP: (!) 149/62  Pulse: 69  Resp: 20  Temp: (!) 96.8 F (36 C)  TempSrc: Temporal  SpO2: 95%  Weight: 113 lb (51.3 kg)  Height: _0  (1.626 m)

## 2020-09-17 ENCOUNTER — Other Ambulatory Visit: Payer: Self-pay | Admitting: Radiation Oncology

## 2020-09-17 DIAGNOSIS — C3431 Malignant neoplasm of lower lobe, right bronchus or lung: Secondary | ICD-10-CM

## 2020-09-18 ENCOUNTER — Ambulatory Visit: Payer: Medicare Other | Admitting: Emergency Medicine

## 2020-09-18 ENCOUNTER — Encounter: Payer: Self-pay | Admitting: Emergency Medicine

## 2020-09-18 ENCOUNTER — Other Ambulatory Visit: Payer: Self-pay

## 2020-09-18 DIAGNOSIS — C3431 Malignant neoplasm of lower lobe, right bronchus or lung: Secondary | ICD-10-CM | POA: Diagnosis not present

## 2020-09-18 DIAGNOSIS — J449 Chronic obstructive pulmonary disease, unspecified: Secondary | ICD-10-CM

## 2020-09-18 MED ORDER — DOXYCYCLINE HYCLATE 100 MG PO TABS: 100.0000 mg | ORAL_TABLET | Freq: Two times a day (BID) | ORAL | 0 refills | Status: DC

## 2020-09-18 MED ORDER — PREDNISONE 10 MG PO TABS: ORAL_TABLET | ORAL | 0 refills | Status: DC

## 2020-09-18 NOTE — Progress Notes (Signed)
Subjective:    Patient ID: [name], female    DOB: June 10, 1929, 87 y.o.   MRN: [id number]  HPI  ROV 03/06/20 --[name] is 87, follows up today for her right lower lobe spiculated mass.  She also has a history of severe COPD with associated hypoxemic respiratory failure, hypertension, AVR, atrial fibrillation, pleural plaques with asbestos exposure.  She is on chronic prednisone, Stiolto, oxygen at 2 L/min. PET scan done on 02/22/2020 reviewed by me, showed hypermetabolism in her 2.6 x 2.6 right lower lobe nodule that abuts the pleural surface.  She also has a hypermetabolic left upper lobe 8 mm nodule consistent with synchronous primaries. Discussed with her today - she would be willing to speak with radiation oncology to consider XRT without a tissue dx.   ROV 04/10/20 --follow-up visit for patient with a history of severe COPD, hypertension, A. fib, AVR.  She has hypoxemic respiratory failure in the setting of this.  She is on chronic prednisone now 20m since July, oxygen at 2 L/min.  She also has some pleural plaques and pulmonary nodular disease on CT chest, documented asbestos exposure.  Last time we discussed the possibility of referral to radiation oncology for possible empiric therapy. Currently managed on Stiolto, uses albuterol about 1-2x a day, has HFA and nebs. She has noticed increase exertional SOB with activity. She has trouble getting around the house and has seen desaturations on 2L/min to low 80's. Has difficulty taking a deep breath.     ROV 09/17/20 --[age]year old woman with severe COPD, pleural plaques and pulmonary nodular disease with a documented asbestos exposure and associated hypoxemic respiratory failure.  Also with a past history of atrial fibrillation, hypertension, AVR.    CT chest 12/31/2019 showed interval increase in the size of a right lower lobe spiculated mass with a possible synchronous left upper lobe malignancy, hypermetabolic on PET scan from 02/2020.   She underwent SBRT December 2021 to the right lung lesion  Repeat CT chest 09/13/2020 reviewed by me, shows postradiation changes in the right lower lobe with decrease in size of the associated nodule.  The small left upper lobe nodular area looks to be resolved.  There is associated emphysematous change  Currently managed on Stiolto, has albuterol which she uses approximately . For the last week she has had some L costal margin pain. She does have some cough, intermittent thick mucous. She has been more SOB for the last 3-4 weeks. She increased O2 from 2 to 3L/min w exertion. Remains on pred 85mdaily   Review of Systems As per HPi     Objective:   Physical Exam Vitals:   09/18/20 1135  BP: 98/68  Pulse: 66  Temp: (!) 97.5 F (36.4 C)  TempSrc: Temporal  SpO2: 93%  Weight: 111 lb 12.8 oz (50.7 kg)  Height: _0  (1.626 m)   Gen: Pleasant, thin, in no distress,  normal affect, in wheelchair with oxygen in place  ENT: No lesions,  mouth clear,  oropharynx clear, no postnasal drip  Neck: No JVD, no stridor  Lungs: No use of accessory muscles, very distant but only few scattered expiratory wheezes  Cardiovascular: RRR, heart sounds normal, no murmur or gallops, no peripheral edema  Musculoskeletal: No deformities, no cyanosis or clubbing  Neuro: alert, awake, non focal  Skin: Warm, no lesions or rash     Assessment & Plan:  Chronic obstructive pulmonary disease (HCHenagarSuspect that her dyspnea is being driven by her severe  COPD and progression of disease.  She does not appear to be fluid overloaded or have any evidence for decompensated cardiac.  We will give a prednisone taper down to a higher maintenance dose of 20 mg daily.  We may be able to get down to 15 mg daily ultimately.  We will choose the lowest effective dose.  Continue Stiolto 2 puffs once a day. Keep your albuterol available to take 2 puffs up to every 4 hours if needed for shortness of breath, chest tightness,  wheezing.  We will refill this for you today. Take prednisone as directed, down to a maintenance dose of 20 mg daily.  Stay on 20 mg daily until we have an opportunity to follow-up in office. Take doxycycline as directed until completely gone Follow with Dr Lamonte Sakai in 1 month  Lung cancer [facility]) Surveillance CT scan reviewed, shows area of treatment in the right upper lobe as expected.  The left-sided nodule has resolved.  Reviewed this with her today.  Baltazar Apo, MD, PhD 09/18/2020, 12:06 PM [facility] [phone] or if no answer [phone]

## 2020-09-18 NOTE — Patient Instructions (Addendum)
Continue Stiolto 2 puffs once a day. Keep your albuterol available to take 2 puffs up to every 4 hours if needed for shortness of breath, chest tightness, wheezing.  We will refill this for you today. Take prednisone as directed, down to a maintenance dose of 20 mg daily.  Stay on 20 mg daily until we have an opportunity to follow-up in office. Take doxycycline as directed until completely gone Continue your oxygen at 2-3 L/min Follow with Dr Lamonte Sakai in 1 month

## 2020-09-18 NOTE — Assessment & Plan Note (Signed)
Surveillance CT scan reviewed, shows area of treatment in the right upper lobe as expected.  The left-sided nodule has resolved.  Reviewed this with her today.

## 2020-09-18 NOTE — Assessment & Plan Note (Signed)
Suspect that her dyspnea is being driven by her severe COPD and progression of disease.  She does not appear to be fluid overloaded or have any evidence for decompensated cardiac.  We will give a prednisone taper down to a higher maintenance dose of 20 mg daily.  We may be able to get down to 15 mg daily ultimately.  We will choose the lowest effective dose.  Continue Stiolto 2 puffs once a day. Keep your albuterol available to take 2 puffs up to every 4 hours if needed for shortness of breath, chest tightness, wheezing.  We will refill this for you today. Take prednisone as directed, down to a maintenance dose of 20 mg daily.  Stay on 20 mg daily until we have an opportunity to follow-up in office. Take doxycycline as directed until completely gone Follow with Dr Lamonte Sakai in 1 month

## 2020-09-22 NOTE — Progress Notes (Signed)
Cardiology Office Note:    Date:  09/24/2020   ID:  ANH BIGOS, DOB Oct 14, 1929, MRN [id number]  PCP:  [name], PA-C  Cardiologist:  Sinclair Grooms, MD   Referring MD: [name], PA-C   Chief Complaint  Patient presents with  . Follow-up    COPD History of atrial flutter Frailty Aortic valve replacement    History of Present Illness:    [name] is a 87 y.o. 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 Echocardiogram showed LVEF at 60 to 65% with indeterminate diastolic function and a normal LA. Converted on amiodarone which was stopped due to lung disease.   She was hospitalized at the end of December/early January with swelling, progressive shortness of breath, weakness, and hypoxia.  She was found to be in atrial flutter and eventually was started on amiodarone and had spontaneous conversion.  Amiodarone was discontinued because of her chronic lung disease.  She continues to do relatively well off amiodarone.  She has been followed up in the atrial fibrillation clinic.  She has recently had Deltasone added due to progressive shortness of breath.  O2 saturation at rest is 92%.  With walking 76%.  She has diuretic therapy that she uses "when my legs swell".  She has not used it lately.  Past Medical History:  Diagnosis Date  . Allergic rhinitis   . Aortic stenosis   . Carotid artery disease ([place])   . Colon polyps   . Congestive heart failure (CHF) ([place])   . h/o Atrial fibrillation ([place]) 03/22/2011   Brief post op   . Hematuria 11/05/2015  . History of radiation therapy 05/02/20-05/15/20   SBRT to Right Lung - Dr.  Sondra Come  . Hypercalcemia   . Hypertension   . Hypothyroid   . Lung cancer, lower lobe ([place]) dx'd 05/2020   xrt  . Osteopenia   . 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 Pressure urticaria   . RLS (restless legs syndrome)   . S/P aortic valve replacement and aortoplasty 04/13/2011   29m Medtronic Freestyle porcine aortic root   . S/P aortic valve replacement with stentless valve 03/18/2011    Past Surgical History:  Procedure Laterality Date  . AORTIC ROOT REPLACEMENT  03/18/2011   215mMedtronic Freestyle Porcine aortic root with reimplantation of coronary arteries  . BREAST LUMPECTOMY     Left  . CATARACT EXTRACTION    . CHOLECYSTECTOMY    . FOOT SURGERY     Morton's neuroma  . KNEE ARTHROSCOPY     Left    Current Medications: Current Meds  Medication Sig  . acetaminophen (TYLENOL) 500 MG tablet Take 500 mg by mouth every 6 (six) hours as needed. Take 3 tablets every 6 hours  . albuterol (PROAIR HFA) 108 (90 Base) MCG/ACT inhaler Inhale 2 puffs into the lungs every 6 (six) hours as needed for wheezing or shortness of breath.  . Marland KitchenmLODipine (NORVASC) 5 MG tablet Take 0.5 tablets (2.5 mg total) by mouth daily.  . Marland Kitchenpixaban (ELIQUIS) 2.5 MG TABS tablet Take 1 tablet (2.5 mg total) by mouth 2 (two) times daily.  . Cholecalciferol (VITAMIN D-3 PO) Take 1 capsule by mouth daily.  .Marland Kitchen  furosemide (LASIX) 20 MG tablet Take 1-2 tablets (20-40 mg total) by mouth daily as needed for fluid or edema.  Marland Kitchen levothyroxine (SYNTHROID) 112 MCG tablet Take 1 tablet by mouth once daily  . Melatonin 10 MG TABS Take 10 mg by mouth at bedtime.  . metoprolol succinate (TOPROL-XL) 25 MG 24 hr tablet Take 1 tablet (25 mg total) by mouth daily.  . Omega-3 Fatty Acids (FISH OIL) 1000 MG CAPS Take 1,000 mg by mouth daily.  . OXYGEN Inhale 2-3 L/min into the lungs See admin instructions. Inhale 2 L/min into the lungs continuously and 3 L/min with any exertion  . predniSONE (DELTASONE) 10 MG tablet Take 4 tablets X 3  days, 3 tabs X 3 days, continue  2 tabs daily until next office visit  . simvastatin (ZOCOR) 20 MG tablet TAKE 1 TABLET BY MOUTH AT BEDTIME  . STIOLTO RESPIMAT 2.5-2.5 MCG/ACT AERS INHALE 2 PUFFS BY MOUTH ONCE DAILY. PLEASE SEE DR FOR REFILLS.     Allergies:   Clarithromycin, Tamsulosin, Trazodone and nefazodone, and Penicillins   Social History   Socioeconomic History  . Marital status: Widowed    Spouse name: Not on file  . Number of children: N  . Years of education: Not on file  . Highest education level: Not on file  Occupational History  . Occupation: housewife  Tobacco Use  . Smoking status: Former Smoker    Packs/day: 0.90    Years: 62.00    Pack years: 55.80    Types: Cigarettes    Quit date: 07/14/2010    Years since quitting: 10.2  . Smokeless tobacco: Never Used  Vaping Use  . Vaping Use: Never used  Substance and Sexual Activity  . Alcohol use: No    Alcohol/week: 0.0 standard drinks  . Drug use: No  . Sexual activity: Never  Other Topics Concern  . Not on file  Social History Narrative  . Not on file   Social Determinants of Health   Financial Resource Strain: Not on file  Food Insecurity: No Food Insecurity  . Worried About Charity fundraiser in the Last Year: Never true  . Ran Out of Food in the Last Year: Never true  Transportation Needs: No Transportation Needs  . Lack of Transportation (Medical): No  . Lack of Transportation (Non-Medical): No  Physical Activity: Not on file  Stress: Not on file  Social Connections: Not on file     Family History: The patient's family history includes Asthma in her maternal aunt and maternal uncle; Breast cancer in her sister; Cancer in her mother; Emphysema in her maternal aunt; Heart disease in her maternal grandmother; Lung cancer in her father; Uterine cancer in her sister.  ROS:   Please see the history of present illness.    Cannot remember when she was admitted to the hospital and January.  Appetite is  been stable.  Recently seen by Dr. Lamonte Sakai..  Prednisone added significantly improved shortness of breath.  All other systems reviewed and are negative.  EKGs/Labs/Other Studies Reviewed:    The following studies were reviewed today:  2D Doppler echocardiogram January 2022: IMPRESSIONS  1. Left ventricular ejection fraction, by estimation, is 60 to 65%. The  left ventricle has normal function. The left ventricle has no regional  wall motion abnormalities. Left ventricular diastolic parameters are  consistent with Grade II diastolic  dysfunction (pseudonormalization). Elevated left ventricular end-diastolic  pressure.  2. Right ventricular systolic function is mildly reduced. The right  ventricular size is mildly enlarged. There is severely elevated pulmonary  artery systolic pressure.  3. Left atrial size was moderately dilated.  4. Right atrial size was moderately dilated.  5. The mitral valve is abnormal. Trivial mitral valve regurgitation. No  evidence of mitral stenosis.  6. Tricuspid valve regurgitation is moderate.  7. Post 21 mm Medtronic Freestyle valve normal functioning . The aortic  valve has been repaired/replaced. Aortic valve regurgitation is not  visualized. No aortic stenosis is present.  8. The inferior vena cava is dilated in size with >50% respiratory  variability, suggesting right atrial pressure of 8 mmHg.    EKG:  EKG not repeated  Recent Labs: 04/24/2020: TSH 0.984 06/09/2020: B Natriuretic Peptide 367.1 06/14/2020: Magnesium 2.3 06/27/2020: ALT 28 06/28/2020: Hemoglobin 14.1; Platelets 165 07/03/2020: BUN 24; Creatinine, Ser 1.29; Potassium 3.5; Sodium 144  Recent Lipid Panel    Component Value Date/Time   CHOL 151 03/06/2019 1009   TRIG 85 03/06/2019 1009   HDL 81 03/06/2019 1009   CHOLHDL 1.9 03/06/2019 1009   CHOLHDL 2.1 11/08/2015 1011   VLDL 17 11/08/2015 1011   LDLCALC 54 03/06/2019 1009    Physical Exam:    VS:  BP (!) 124/56   Pulse  60   Ht _0  (1.626 m)   Wt 112 lb (50.8 kg)   SpO2 92%   BMI 19.22 kg/m     Wt Readings from Last 3 Encounters:  09/24/20 112 lb (50.8 kg)  09/18/20 111 lb 12.8 oz (50.7 kg)  09/16/20 113 lb (51.3 kg)     GEN: Elderly and frail. No acute distress HEENT: Normal NECK: No JVD. LYMPHATICS: No lymphadenopathy CARDIAC: 1/6 systolic murmur. RRR no gallop, or edema. VASCULAR:  Normal Pulses. No bruits. RESPIRATORY:  Clear to auscultation without rales, wheezing or rhonchi  ABDOMEN: Soft, non-tender, non-distended, No pulsatile mass, MUSCULOSKELETAL: No deformity  SKIN: Warm and dry NEUROLOGIC:  Alert and oriented x 3 PSYCHIATRIC:  Normal affect   ASSESSMENT:    1. S/P aortic valve replacement and aortoplasty   2. Hypoxemia   3. Chronic diastolic CHF (congestive heart failure) ([place])   4. Essential hypertension   5. Atrial flutter, unspecified type ([place])   6. Chronic obstructive pulmonary disease, unspecified COPD type ([place])   7. CKD (chronic kidney disease) stage 4, GFR 15-29 ml/min (H[facility])   8. History of lung cancer   9. Shortness of breath    PLAN:    In order of problems listed above:  1. Valve function is maintaining normal morphology and function by echo done recently 2. Most likely related to COPD 3. No clinical evidence of volume overload.  BNP will be done. 4. Excellent blood pressure.  Target 140/80 or less. 5. No clinical evidence of atrial flutter. 6. Coming off of a steroid Dosepak. 7. Chronic kidney disease with most recent creatinine of 1.29 in February. 8. Not addressed 9. BNP  Overall plan to continue same therapy.  Only adjustment will be diuresis if BNP is elevated.  Clinical follow-up in 1 year.  Prognosis is dependent upon progression of COPD.   Medication Adjustments/Labs and Tests Ordered: Current medicines are reviewed at length with the patient today.  Concerns regarding medicines are outlined above.  No orders of the defined types were placed  in this encounter.  No orders of the defined types were placed in this encounter.   Patient Instructions  Medication Instructions:  Your physician recommends that you continue on your current medications  as directed. Please refer to the Current Medication list given to you today.  *If you need a refill on your cardiac medications before your next appointment, please call your pharmacy*   Lab Work: BMET, ProBNP If you have labs (blood work) drawn today and your tests are completely normal, you will receive your results only by: Marland Kitchen MyChart Message (if you have MyChart) OR . A paper copy in the mail If you have any lab test that is abnormal or we need to change your treatment, we will call you to review the results.   Testing/Procedures: None   Follow-Up: At [facility], you and your health needs are our priority.  As part of our continuing mission to provide you with exceptional heart care, we have created designated Provider Care Teams.  These Care Teams include your primary Cardiologist (physician) and Advanced Practice Providers (APPs -  Physician Assistants and Nurse Practitioners) who all work together to provide you with the care you need, when you need it.  We recommend signing up for the patient portal called "MyChart".  Sign up information is provided on this After Visit Summary.  MyChart is used to connect with patients for Virtual Visits (Telemedicine).  Patients are able to view lab/test results, encounter notes, upcoming appointments, etc.  Non-urgent messages can be sent to your provider as well.   To learn more about what you can do with MyChart, go to [email or web address].    Your next appointment:   1 year(s)  The format for your next appointment:   In Person  Provider:   You may see Sinclair Grooms, MD or one of the following Advanced Practice Providers on your designated Care Team:    Kathyrn Drown, NP    Other Instructions      Signed, Sinclair Grooms, MD  09/24/2020 11:29 AM    [place]

## 2020-09-24 ENCOUNTER — Other Ambulatory Visit: Payer: Self-pay

## 2020-09-24 ENCOUNTER — Ambulatory Visit: Payer: Medicare Other | Admitting: Interventional Cardiology

## 2020-09-24 ENCOUNTER — Encounter: Payer: Self-pay | Admitting: Interventional Cardiology

## 2020-09-24 VITALS — BP 124/56 | HR 60 | Ht 64.0 in | Wt 112.0 lb

## 2020-09-24 DIAGNOSIS — R0602 Shortness of breath: Secondary | ICD-10-CM

## 2020-09-24 DIAGNOSIS — I5032 Chronic diastolic (congestive) heart failure: Secondary | ICD-10-CM | POA: Diagnosis not present

## 2020-09-24 DIAGNOSIS — R0902 Hypoxemia: Secondary | ICD-10-CM

## 2020-09-24 DIAGNOSIS — J449 Chronic obstructive pulmonary disease, unspecified: Secondary | ICD-10-CM

## 2020-09-24 DIAGNOSIS — Z952 Presence of prosthetic heart valve: Secondary | ICD-10-CM | POA: Diagnosis not present

## 2020-09-24 DIAGNOSIS — I10 Essential (primary) hypertension: Secondary | ICD-10-CM | POA: Diagnosis not present

## 2020-09-24 DIAGNOSIS — I4892 Unspecified atrial flutter: Secondary | ICD-10-CM

## 2020-09-24 DIAGNOSIS — Z85118 Personal history of other malignant neoplasm of bronchus and lung: Secondary | ICD-10-CM

## 2020-09-24 DIAGNOSIS — N184 Chronic kidney disease, stage 4 (severe): Secondary | ICD-10-CM

## 2020-09-24 NOTE — Patient Instructions (Signed)
Medication Instructions:  Your physician recommends that you continue on your current medications as directed. Please refer to the Current Medication list given to you today.  *If you need a refill on your cardiac medications before your next appointment, please call your pharmacy*   Lab Work: BMET, ProBNP If you have labs (blood work) drawn today and your tests are completely normal, you will receive your results only by: Marland Kitchen MyChart Message (if you have MyChart) OR . A paper copy in the mail If you have any lab test that is abnormal or we need to change your treatment, we will call you to review the results.   Testing/Procedures: None   Follow-Up: At [facility], you and your health needs are our priority.  As part of our continuing mission to provide you with exceptional heart care, we have created designated Provider Care Teams.  These Care Teams include your primary Cardiologist (physician) and Advanced Practice Providers (APPs -  Physician Assistants and Nurse Practitioners) who all work together to provide you with the care you need, when you need it.  We recommend signing up for the patient portal called "MyChart".  Sign up information is provided on this After Visit Summary.  MyChart is used to connect with patients for Virtual Visits (Telemedicine).  Patients are able to view lab/test results, encounter notes, upcoming appointments, etc.  Non-urgent messages can be sent to your provider as well.   To learn more about what you can do with MyChart, go to [email or web address].    Your next appointment:   1 year(s)  The format for your next appointment:   In Person  Provider:   You may see Sinclair Grooms, MD or one of the following Advanced Practice Providers on your designated Care Team:    Kathyrn Drown, NP    Other Instructions

## 2020-09-25 LAB — BASIC METABOLIC PANEL
BUN/Creatinine Ratio: 19 (ref 12–28)
BUN: 29 mg/dL — ABNORMAL HIGH (ref 8–27)
CO2: 31 mmol/L — ABNORMAL HIGH (ref 20–29)
Calcium: 9.4 mg/dL (ref 8.7–10.3)
Chloride: 100 mmol/L (ref 96–106)
Creatinine, Ser: 1.51 mg/dL — ABNORMAL HIGH (ref 0.57–1.00)
Glucose: 125 mg/dL — ABNORMAL HIGH (ref 65–99)
Potassium: 4.8 mmol/L (ref 3.5–5.2)
Sodium: 145 mmol/L — ABNORMAL HIGH (ref 134–144)
eGFR: 33 mL/min/{1.73_m2} — ABNORMAL LOW (ref >59)

## 2020-09-25 LAB — PRO B NATRIURETIC PEPTIDE: NT-Pro BNP: 1332 pg/mL — ABNORMAL HIGH (ref 0–738)

## 2020-09-26 ENCOUNTER — Telehealth: Payer: Self-pay | Admitting: *Deleted

## 2020-09-26 DIAGNOSIS — I5032 Chronic diastolic (congestive) heart failure: Secondary | ICD-10-CM

## 2020-09-26 MED ORDER — FUROSEMIDE 20 MG PO TABS: 20.0000 mg | ORAL_TABLET | Freq: Every day | ORAL | 3 refills | Status: DC

## 2020-09-26 NOTE — Telephone Encounter (Signed)
-----  Message from Belva Crome, MD sent at 09/26/2020  3:30 PM EDT ----- Let the patient know she should take Lasix 20 mg daily. BMET 7-10 days. K-Dur 10 meq daily. Try this to see if breathing improves. Call if feels worse (light headed/dizzy) A copy will be sent to [name], PA-C

## 2020-09-26 NOTE — Telephone Encounter (Signed)
Spoke with pt and reviewed results and recommendations per Dr. Tamala Julian.  Pt agreeable to plan.  She only has transportation on Wednesdays.  Scheduled her to come next Wednesday for labs.

## 2020-09-30 ENCOUNTER — Other Ambulatory Visit: Payer: Self-pay | Admitting: Physician Assistant

## 2020-09-30 MED ORDER — POTASSIUM CHLORIDE ER 10 MEQ PO TBCR: 10.0000 meq | EXTENDED_RELEASE_TABLET | Freq: Every day | ORAL | 3 refills | Status: DC

## 2020-09-30 NOTE — Addendum Note (Signed)
Addended by: Loren Racer on: 09/30/2020 08:14 AM   Modules accepted: Orders

## 2020-10-02 ENCOUNTER — Other Ambulatory Visit: Payer: Self-pay

## 2020-10-02 ENCOUNTER — Other Ambulatory Visit: Payer: Medicare Other | Admitting: *Deleted

## 2020-10-02 DIAGNOSIS — I5032 Chronic diastolic (congestive) heart failure: Secondary | ICD-10-CM

## 2020-10-03 ENCOUNTER — Other Ambulatory Visit: Payer: Self-pay | Admitting: *Deleted

## 2020-10-03 LAB — BASIC METABOLIC PANEL
BUN/Creatinine Ratio: 14 (ref 12–28)
BUN: 28 mg/dL — ABNORMAL HIGH (ref 8–27)
CO2: 34 mmol/L — ABNORMAL HIGH (ref 20–29)
Calcium: 9.8 mg/dL (ref 8.7–10.3)
Chloride: 90 mmol/L — ABNORMAL LOW (ref 96–106)
Creatinine, Ser: 1.98 mg/dL — ABNORMAL HIGH (ref 0.57–1.00)
Glucose: 128 mg/dL — ABNORMAL HIGH (ref 65–99)
Potassium: 4.6 mmol/L (ref 3.5–5.2)
Sodium: 140 mmol/L (ref 134–144)
eGFR: 24 mL/min/{1.73_m2} — ABNORMAL LOW (ref >59)

## 2020-10-03 MED ORDER — POTASSIUM CHLORIDE ER 10 MEQ PO TBCR: 10.0000 meq | EXTENDED_RELEASE_TABLET | ORAL | 3 refills | Status: AC

## 2020-10-03 MED ORDER — FUROSEMIDE 20 MG PO TABS
20.0000 mg | ORAL_TABLET | ORAL | 3 refills | Status: DC
Start: 2020-10-04 — End: 2021-02-18

## 2020-10-04 ENCOUNTER — Other Ambulatory Visit: Payer: Self-pay | Admitting: Emergency Medicine

## 2020-10-10 ENCOUNTER — Other Ambulatory Visit: Payer: Self-pay | Admitting: *Deleted

## 2020-10-10 NOTE — Patient Outreach (Signed)
[place] [facility]) Care Management  10/10/2020  CHARIKA MIKELSON [birth year] [id number]   Telephone Assessment-Unsuccessful  RN attempted outreach call today however unsuccessful. RN able to leave a HIPAA approved voice message requesting a call back.  Will follow up once again next week.  Raina Mina, RN Care Management Coordinator [place] Office [phone]

## 2020-10-18 ENCOUNTER — Other Ambulatory Visit: Payer: Self-pay | Admitting: *Deleted

## 2020-10-18 NOTE — Patient Outreach (Signed)
[place] [facility]) Care Management  10/18/2020  [name] 01-26-1930 [id number]   Telephone Assessment-Successful-COPD  Spoke with pt today and received an update on here on her ongoing management of care. Pt remains in the GREEN zone and continues to manager her care independently with no acute symptoms or issues. Plan reviewed and discussed with all goals and interventions. Pt receptive and understanding and will continue to adherent to all discussed.  Will follow up next month with another update and continue communication with her provider accordingly. No acute issues to address at this time.  Goals Addressed            This Visit's Progress   . THN-Track and Manage My Symptoms-COPD   On track    Follow Up Date 11/19/2020 Timeframe:  Long-Range Goal Priority:  Medium Start Date:     06/21/2020                        Expected End Date:     12/13/2020                 Barriers: Health Behaviors  - begin a symptom diary - bring symptom diary to all visits - develop a rescue plan - eliminate symptom triggers at home - follow rescue plan if symptoms flare-up - keep follow-up appointments - use an extra pillow to sleep    Why is this important?    Tracking your symptoms and other information about your health helps your doctor plan your care.   Write down the symptoms, the time of day, what you were doing and what medicine you are taking.   You will soon learn how to manage your symptoms.     Notes:  6/3-Pt doing well with no acute flare-ups and continue to take all her medication and uses her home O2 24/7 with no incidents over the last month. Will continue encouraged adherence to the discussed plan of care. 2/4-Developed action plan for long term goals as pt receptive and will comply with all discussed for his goals and interventions mentioned above. Awareness of all future appointments as confirmed today.    Baker Pierini and Manage My Triggers-COPD   On track     Follow Up Date  11/19/2020 Timeframe:  Short-Term Goal Priority:  Medium Start Date:    06/21/2020                         Expected End Date:    12/13/2020                   Barriers: Health Behaviors  - avoid second hand smoke - eliminate smoking in my home - identify and avoid work-related triggers - identify and remove indoor air pollutants - limit outdoor activity during cold weather - listen for public air quality announcements every day    Why is this important?    Triggers are activities or things, like tobacco smoke or cold weather, that make your COPD (chronic obstructive pulmonary disease) flare-up.   Knowing these triggers helps you plan how to stay away from them.   When you cannot remove them, you can learn how to manage them.     Notes:  6/3-Pt continues to avoid triggers and prohibits smokers in her home that may cause an exacerbation episode. Pt mostly stays inside to avoid pollutants that may cause a flare-up. Pt remains on her 3 lts of home  O2 24/7 as recommended. 4/26-Pt reports issues with her breathing at times however resolved with doses of Laxis permitted by her pulmonologist. Pt takes 2 pills (40 mg) for a few days and remains on her 3 liters of home O2. Denies any swelling today and however limited with her mobility she reports no additional SOB today. Pt aware she is able to wean to 1 dose of Laxis if needed and 4 liters due to the long tubing for full oxygenation however to check with her provider provider to long term usage of the 4 liters. Pt is in the GREEN zone today without symptoms. 3/22-Pt verifies she continues to do well with no acute symptoms since the last conversation and pt continue to be safe in her home.  Supportive assistance from her family when needed. Pt received the [facility] packet as RN encouraged pt to review the COPD tab for ongoing education and reiterated on the importance of managing this condition.  2/18-Reiterated on the same plan of care as  pt remains on track with no acute symptoms as discussed. Review the plan of care for pt's knowledge base. 2/4-Discussed the plan of care once again and what to avoid that would risk flare ups and exacerbated episodes. Pt aware of several risk and continue to avoid.        Raina Mina, RN Care Management Coordinator [place] Office [phone]

## 2020-10-23 ENCOUNTER — Encounter: Payer: Self-pay | Admitting: Emergency Medicine

## 2020-10-23 ENCOUNTER — Other Ambulatory Visit: Payer: Self-pay

## 2020-10-23 ENCOUNTER — Ambulatory Visit: Payer: Medicare Other | Admitting: Emergency Medicine

## 2020-10-23 DIAGNOSIS — J449 Chronic obstructive pulmonary disease, unspecified: Secondary | ICD-10-CM

## 2020-10-23 DIAGNOSIS — C3431 Malignant neoplasm of lower lobe, right bronchus or lung: Secondary | ICD-10-CM

## 2020-10-23 DIAGNOSIS — J9611 Chronic respiratory failure with hypoxia: Secondary | ICD-10-CM | POA: Diagnosis not present

## 2020-10-23 MED ORDER — PREDNISONE 20 MG PO TABS: 20.0000 mg | ORAL_TABLET | Freq: Every day | ORAL | 5 refills | Status: AC

## 2020-10-23 MED ORDER — ALBUTEROL SULFATE HFA 108 (90 BASE) MCG/ACT IN AERS: 2.0000 | INHALATION_SPRAY | Freq: Four times a day (QID) | RESPIRATORY_TRACT | 5 refills | Status: AC | PRN

## 2020-10-23 NOTE — Patient Instructions (Addendum)
We will plan to continue prednisone 20 mg daily for now.  We can decide in the future whether it may be possible to cut down slightly. Continue Stiolto 2 puffs once daily. Keep your albuterol available to use 2 puffs if needed for shortness of breath, chest tightness, wheezing. Continue oxygen at 3 L/min Follow with Dr. Lamonte Sakai in 2 months or sooner if you have any problems.

## 2020-10-23 NOTE — Assessment & Plan Note (Signed)
Stable post treatment most recent imaging

## 2020-10-23 NOTE — Assessment & Plan Note (Signed)
Continue 3L/min

## 2020-10-23 NOTE — Addendum Note (Signed)
Addended by: Lia Foyer R on: 10/23/2020 12:06 PM   Modules accepted: Orders

## 2020-10-23 NOTE — Progress Notes (Signed)
Subjective:    Patient ID: [name], female    DOB: 07/20/1929, 87 y.o.   MRN: [id number]  HPI  ROV 03/06/20 --Mrs. Visconti is 69, follows up today for her right lower lobe spiculated mass.  She also has a history of severe COPD with associated hypoxemic respiratory failure, hypertension, AVR, atrial fibrillation, pleural plaques with asbestos exposure.  She is on chronic prednisone, Stiolto, oxygen at 2 L/min. PET scan done on 02/22/2020 reviewed by me, showed hypermetabolism in her 2.6 x 2.6 right lower lobe nodule that abuts the pleural surface.  She also has a hypermetabolic left upper lobe 8 mm nodule consistent with synchronous primaries. Discussed with her today - she would be willing to speak with radiation oncology to consider XRT without a tissue dx.   ROV 04/10/20 --follow-up visit for patient with a history of severe COPD, hypertension, A. fib, AVR.  She has hypoxemic respiratory failure in the setting of this.  She is on chronic prednisone now 68m since July, oxygen at 2 L/min.  She also has some pleural plaques and pulmonary nodular disease on CT chest, documented asbestos exposure.  Last time we discussed the possibility of referral to radiation oncology for possible empiric therapy. Currently managed on Stiolto, uses albuterol about 1-2x a day, has HFA and nebs. She has noticed increase exertional SOB with activity. She has trouble getting around the house and has seen desaturations on 2L/min to low 80's. Has difficulty taking a deep breath.     ROV 09/17/20 --87year old woman with severe COPD, pleural plaques and pulmonary nodular disease with a documented asbestos exposure and associated hypoxemic respiratory failure.  Also with a past history of atrial fibrillation, hypertension, AVR.    CT chest 12/31/2019 showed interval increase in the size of a right lower lobe spiculated mass with a possible synchronous left upper lobe malignancy, hypermetabolic on PET scan from 02/2020.   She underwent SBRT December 2021 to the right lung lesion  Repeat CT chest 09/13/2020 reviewed by me, shows postradiation changes in the right lower lobe with decrease in size of the associated nodule.  The small left upper lobe nodular area looks to be resolved.  There is associated emphysematous change  Currently managed on Stiolto, has albuterol which she uses approximately . For the last week she has had some L costal margin pain. She does have some cough, intermittent thick mucous. She has been more SOB for the last 3-4 weeks. She increased O2 from 2 to 3L/min w exertion. Remains on pred 154mdaily  ROV 10/23/20 --this follow-up visit for [age]ear old woman with history of severe obstructive lung disease, pleural plaques with pulmonary nodular disease (documented asbestos exposure).  She has hypoxemic respiratory failure.  Also followed by cardiology for A. fib, hypertension and aortic valve replacement.  She was having persistent dyspnea, cough when I saw her last month.  We gave a prednisone taper down to 20 mg.  She has stayed on this dose.  Her previous baseline had been 10 mg.  She reports today that she is unsure how much the taper improved her. She does believe that the higher doses of pred helped her chest tightness. She ran out of pred last week, did not refill, has had more SOB and chest tightness since then. She is on stiolto, wants another albuterol inhaler to carry in her purse. Her O2 is on 3L/min. No sputum, flaring sx.    Review of Systems As per HPI  Objective:   Physical Exam Vitals:   10/23/20 1143  BP: 100/60  Pulse: 66  Temp: 97.9 F (36.6 C)  TempSrc: Temporal  SpO2: 95%  Weight: 109 lb 3.2 oz (49.5 kg)  Height: _0  (1.626 m)   Gen: Pleasant, thin, in no distress,  normal affect, in wheelchair with oxygen in place  ENT: No lesions,  mouth clear,  oropharynx clear, no postnasal drip  Neck: No JVD, no stridor  Lungs: No use of accessory muscles, very distant  but only few scattered expiratory wheezes  Cardiovascular: RRR, heart sounds normal, no murmur or gallops, no peripheral edema  Musculoskeletal: No deformities, no cyanosis or clubbing  Neuro: alert, awake, non focal  Skin: Warm, no lesions or rash     Assessment & Plan:  Chronic respiratory failure with hypoxia (HCC) Continue 3L/min  Lung cancer (HCC) Stable post treatment most recent imaging  Chronic obstructive pulmonary disease ([place]) She benefited from recent prednisone taper and then 20 mg daily.  That she ran out of prednisone and has had more dyspnea since.  I will restart her on 20 mg daily.  Depending on progress we may be able to get her down to 15 mg daily at some point in the future.  We will reassess in 2 months.  For now continue Stiolto, albuterol as needed  Baltazar Apo, MD, PhD 10/23/2020, 11:55 AM [facility] [phone] or if no answer [phone]

## 2020-10-23 NOTE — Assessment & Plan Note (Signed)
She benefited from recent prednisone taper and then 20 mg daily.  That she ran out of prednisone and has had more dyspnea since.  I will restart her on 20 mg daily.  Depending on progress we may be able to get her down to 15 mg daily at some point in the future.  We will reassess in 2 months.  For now continue Stiolto, albuterol as needed

## 2020-11-07 ENCOUNTER — Other Ambulatory Visit: Payer: Self-pay | Admitting: Physician Assistant

## 2020-11-07 DIAGNOSIS — E038 Other specified hypothyroidism: Secondary | ICD-10-CM

## 2020-11-07 NOTE — Telephone Encounter (Signed)
Can you reach out to the patient to schedule a follow up based on their last AVS?

## 2020-11-22 ENCOUNTER — Ambulatory Visit (INDEPENDENT_AMBULATORY_CARE_PROVIDER_SITE_OTHER): Payer: Medicare Other | Admitting: Physician Assistant

## 2020-11-22 ENCOUNTER — Other Ambulatory Visit: Payer: Self-pay

## 2020-11-22 ENCOUNTER — Encounter: Payer: Self-pay | Admitting: Physician Assistant

## 2020-11-22 VITALS — BP 118/55 | HR 70 | Temp 98.3°F | Ht 64.0 in | Wt 110.0 lb

## 2020-11-22 DIAGNOSIS — R1310 Dysphagia, unspecified: Secondary | ICD-10-CM | POA: Diagnosis not present

## 2020-11-22 DIAGNOSIS — I10 Essential (primary) hypertension: Secondary | ICD-10-CM

## 2020-11-22 DIAGNOSIS — M1990 Unspecified osteoarthritis, unspecified site: Secondary | ICD-10-CM

## 2020-11-22 DIAGNOSIS — B351 Tinea unguium: Secondary | ICD-10-CM

## 2020-11-22 DIAGNOSIS — J449 Chronic obstructive pulmonary disease, unspecified: Secondary | ICD-10-CM

## 2020-11-22 MED ORDER — CICLOPIROX 8 % EX SOLN: Freq: Every day | CUTANEOUS | 0 refills | Status: DC

## 2020-11-22 MED ORDER — DICLOFENAC SODIUM 1 % EX GEL: 2.0000 g | Freq: Four times a day (QID) | CUTANEOUS | 1 refills | Status: DC

## 2020-11-22 NOTE — Progress Notes (Signed)
Established Patient Office Visit  Subjective:  Patient ID: [name], female    DOB: 1929/06/01  Age: 87 y.o. MRN: [id number]  CC:  Chief Complaint  Patient presents with   Follow-up   Hypertension   Edema    HPI [name] presents for follow up on hypertension and edema. Patient has several c/o including big toenails almost falling off and discolored, intermittent chronic left hand pain (knuckle) and trouble with swallowing. Patient reports dysphagia has been chronic issue which has gradually worsened. States food (liquids and solids) get stuck and has to forcefully cough to get it to move down. Denies acid reflux symptoms or aspiration episodes.  HTN: Pt denies chest pain, palpitations, syncope or headache. Reports lower extremity swelling is stable. Taking Lasix 3 times a week. Taking antihypertensive medication as directed without side effects. Continues to monitor sodium and fluid intake.  COPD: Followed by pulmonology.  Patient is on daily prednisone and reports medication has helped with shortness of breath. Does not have shortness of breath at rest but will get short of breath with activity.  Past Medical History:  Diagnosis Date   Allergic rhinitis    Aortic stenosis    Carotid artery disease (HCC)    Colon polyps    Congestive heart failure (CHF) ([place])    h/o Atrial fibrillation ([place]) 03/22/2011   Brief post op    Hematuria 11/05/2015   History of radiation therapy 05/02/20-05/15/20   SBRT to Right Lung - Dr. Sondra Come   Hypercalcemia    Hypertension    Hypothyroid    Lung cancer, lower lobe ([place]) dx'd 05/2020   xrt   Osteopenia    Oxygen dependent 11/05/2015   Pressure urticaria    RLS (restless legs syndrome)    S/P aortic valve replacement and aortoplasty 04/13/2011   56m Medtronic Freestyle porcine aortic root    S/P aortic valve replacement with stentless valve 03/18/2011    Past Surgical History:  Procedure Laterality Date   AORTIC ROOT REPLACEMENT   03/18/2011   263mMedtronic Freestyle Porcine aortic root with reimplantation of coronary arteries   BREAST LUMPECTOMY     Left   CATARACT EXTRACTION     CHOLECYSTECTOMY     FOOT SURGERY     Morton's neuroma   KNEE ARTHROSCOPY     Left    Family History  Problem Relation Age of Onset   Cancer Mother        bladder   Lung cancer Father    Heart disease Maternal Grandmother    Emphysema Maternal Aunt    Asthma Maternal Aunt    Asthma Maternal Uncle    Breast cancer Sister    Uterine cancer Sister     Social History   Socioeconomic History   Marital status: Widowed    Spouse name: Not on file   Number of children: N   Years of education: Not on file   Highest education level: Not on file  Occupational History   Occupation: housewife  Tobacco Use   Smoking status: Former    Packs/day: 0.90    Years: 62.00    Pack years: 55.80    Types: Cigarettes    Quit date: 07/14/2010    Years since quitting: 10.3   Smokeless tobacco: Never  Vaping Use   Vaping Use: Never used  Substance and Sexual Activity   Alcohol use: No    Alcohol/week: 0.0 standard drinks   Drug use: No   Sexual  activity: Never  Other Topics Concern   Not on file  Social History Narrative   Not on file   Social Determinants of Health   Financial Resource Strain: Not on file  Food Insecurity: No Food Insecurity   Worried About Running Out of Food in the Last Year: Never true   Ran Out of Food in the Last Year: Never true  Transportation Needs: No Transportation Needs   Lack of Transportation (Medical): No   Lack of Transportation (Non-Medical): No  Physical Activity: Not on file  Stress: Not on file  Social Connections: Not on file  Intimate Partner Violence: Not on file    Outpatient Medications Prior to Visit  Medication Sig Dispense Refill   acetaminophen (TYLENOL) 500 MG tablet Take 500 mg by mouth every 6 (six) hours as needed. Take 3 tablets every 6 hours     albuterol (PROAIR HFA)  108 (90 Base) MCG/ACT inhaler Inhale 2 puffs into the lungs every 6 (six) hours as needed for wheezing or shortness of breath. 18 g 5   apixaban (ELIQUIS) 2.5 MG TABS tablet Take 1 tablet (2.5 mg total) by mouth 2 (two) times daily. 180 tablet 3   Cholecalciferol (VITAMIN D-3 PO) Take 1 capsule by mouth daily.     EUTHYROX 112 MCG tablet Take 1 tablet by mouth once daily 90 tablet 0   furosemide (LASIX) 20 MG tablet Take 1 tablet (20 mg total) by mouth every Monday, Wednesday, and Friday. 45 tablet 3   Melatonin 10 MG TABS Take 10 mg by mouth at bedtime.     metoprolol succinate (TOPROL-XL) 25 MG 24 hr tablet Take 1 tablet (25 mg total) by mouth daily. 90 tablet 3   Omega-3 Fatty Acids (FISH OIL) 1000 MG CAPS Take 1,000 mg by mouth daily.     OXYGEN Inhale 2-3 L/min into the lungs See admin instructions. Inhale 2 L/min into the lungs continuously and 3 L/min with any exertion     potassium chloride (KLOR-CON) 10 MEQ tablet Take 1 tablet (10 mEq total) by mouth every Monday, Wednesday, and Friday. 45 tablet 3   predniSONE (DELTASONE) 20 MG tablet Take 1 tablet (20 mg total) by mouth daily with breakfast. 30 tablet 5   simvastatin (ZOCOR) 20 MG tablet TAKE 1 TABLET BY MOUTH AT BEDTIME 90 tablet 0   Tiotropium Bromide-Olodaterol (STIOLTO RESPIMAT) 2.5-2.5 MCG/ACT AERS Take 2 puffs by mouth daily. 12 g 0   amLODipine (NORVASC) 5 MG tablet Take 0.5 tablets (2.5 mg total) by mouth daily. 90 tablet 3   No facility-administered medications prior to visit.    Allergies  Allergen Reactions   Clarithromycin Other (See Comments)    Made the mouth/throat sore   Tamsulosin Itching   Trazodone And Nefazodone Other (See Comments)    Dizziness    Penicillins Itching and Rash    Did it involve swelling of the face/tongue/throat, SOB, or low BP? No Did it involve sudden or severe rash/hives, skin peeling, or any reaction on the inside of your mouth or nose? Yes Did you need to seek medical attention at a  hospital or doctor's office? Yes When did it last happen?      Young woman  If all above answers are "NO", may proceed with cephalosporin use.     ROS Review of Systems Review of Systems:  A fourteen system review of systems was performed and found to be positive as per HPI.   Objective:    Physical Exam  General:  Pleasant and cooperative, in no acute distress, using portable oxygen Neuro:  Alert and oriented,  extra-ocular muscles intact  HEENT:  Normocephalic, atraumatic, neck supple Skin:  no gross rash. Cardiac:  RRR Respiratory:  Not using accessory muscles, speaking in full sentences- unlabored. MSK: good strength and ROM of left hand with crepitus noted, MCP thickening noted of both hands, no joint swelling, warmth or erythema noted. Extremities/Vascular:  Ext warm, no cyanosis apprec.; no gross edema. Thick and yellow discoloration of great toenail of both feet noted.  Psych:  No HI/SI, judgement and insight good, Euthymic mood. Full Affect.  BP (!) 118/55   Pulse 70   Temp 98.3 F (36.8 C)   Ht _0  (1.626 m)   Wt 110 lb (49.9 kg)   SpO2 97%   BMI 18.88 kg/m  Wt Readings from Last 3 Encounters:  11/22/20 110 lb (49.9 kg)  10/23/20 109 lb 3.2 oz (49.5 kg)  09/24/20 112 lb (50.8 kg)     Health Maintenance Due  Topic Date Due   COVID-19 Vaccine (4 - Booster for Pfizer series) 06/26/2020    There are no preventive care reminders to display for this patient.  Lab Results  Component Value Date   TSH 0.984 04/24/2020   Lab Results  Component Value Date   WBC 9.6 06/28/2020   HGB 14.1 06/28/2020   HCT 43.6 06/28/2020   MCV 102 (H) 06/28/2020   PLT 165 06/28/2020   Lab Results  Component Value Date   NA 140 10/02/2020   K 4.6 10/02/2020   CO2 34 (H) 10/02/2020   GLUCOSE 128 (H) 10/02/2020   BUN 28 (H) 10/02/2020   CREATININE 1.98 (H) 10/02/2020   BILITOT 0.3 06/27/2020   ALKPHOS 52 06/27/2020   AST 19 06/27/2020   ALT 28 06/27/2020   PROT 6.1  06/27/2020   ALBUMIN 3.8 06/27/2020   CALCIUM 9.8 10/02/2020   ANIONGAP 8 06/15/2020   EGFR 24 (L) 10/02/2020   Lab Results  Component Value Date   CHOL 151 03/06/2019   Lab Results  Component Value Date   HDL 81 03/06/2019   Lab Results  Component Value Date   LDLCALC 54 03/06/2019   Lab Results  Component Value Date   TRIG 85 03/06/2019   Lab Results  Component Value Date   CHOLHDL 1.9 03/06/2019   Lab Results  Component Value Date   HGBA1C 5.5 03/06/2019      Assessment & Plan:   Problem List Items Addressed This Visit       Cardiovascular and Mediastinum   Essential hypertension    -Stable. -Continue current medication regimen. -Will continue to monitor alongside cardiology.         Respiratory   Chronic obstructive pulmonary disease ([place]) - Primary    -Followed by Pulmonology. -On Stiolto and prednisone. -Reviewed chest CT 09/13/20: IMPRESSION: 1. Suspected post radiation changes in the parenchyma anterior to the RIGHT lower lobe nodule which has decreased in size in the interval. No new or progressive findings. Attention to areas along the fissure where there is some fissural thickening associated with ground-glass and septal thickening on subsequent imaging. 2. Small LEFT upper lobe nodule is essentially resolved, in the area of this nodule in the LEFT upper lobe there is only minimal architectural distortion without visible residual lesion. 3. Marked pulmonary emphysema. 4. Midthoracic compression fractures and associated kyphosis as before. 5. Emphysema and aortic atherosclerosis.       Other Visit  Diagnoses     Osteoarthritis, unspecified osteoarthritis type, unspecified site       Relevant Medications   diclofenac Sodium (VOLTAREN) 1 % GEL   Onychomycosis       Relevant Medications   ciclopirox (PENLAC) 8 % solution   Dysphagia, unspecified type          Osteoarthritis: -Patient has signs and symptoms suggestive of age-related  arthritis and recommend to use topical anti-inflammatory diclofenac gel as needed for pain relief. Patient's last BMP 10/02/2020 indicates CKD stage 4 (Cr 1.98, eGFR 24) so recommend to avoid NSAIDs. Continue Tylenol as needed for pain. If symptoms fail to improve or worsen recommend further evaluation with imaging studies or referral to orthopedics.  Dysphagia: -Discussed with patient potential etiologies including esophageal stricture and inflammatory/functional esophageal disorders. Declines referral to gastroenterology at this time. Will continue to monitor symptoms and advised to let me know if decides to pursue referral. Patient verbalized understanding. Recommend to continue soft diet.  Onychomycosis: -Will start antifungal treatment with ciclopirox solution x 12 weeks. -Will reassess symptoms and treatment therapy at follow up visit.   Meds ordered this encounter  Medications   diclofenac Sodium (VOLTAREN) 1 % GEL    Sig: Apply 2 g topically 4 (four) times daily.    Dispense:  50 g    Refill:  1    Order Specific Question:   Supervising Provider    Answer:   Beatrice Lecher D [[id number]]   ciclopirox (PENLAC) 8 % solution    Sig: Apply topically at bedtime. Apply over nail and surrounding skin. Apply daily over previous coat. After seven (7) days, may remove with alcohol and continue cycle.    Dispense:  6.6 mL    Refill:  0    Order Specific Question:   Supervising Provider    Answer:   Beatrice Lecher D [[id number]]    Follow-up: Return in about 3 months (around 02/22/2021) for HTN, edema , COPD.   Marland KitchenNote:  This note was prepared with assistance of Dragon voice recognition software. Occasional wrong-word or sound-a-like substitutions may have occurred due to the inherent limitations of voice recognition software.  [name], PA-C

## 2020-11-22 NOTE — Assessment & Plan Note (Signed)
-  Followed by Pulmonology. -On Stiolto and prednisone. -Reviewed chest CT 09/13/20: IMPRESSION: 1. Suspected post radiation changes in the parenchyma anterior to the RIGHT lower lobe nodule which has decreased in size in the interval. No new or progressive findings. Attention to areas along the fissure where there is some fissural thickening associated with ground-glass and septal thickening on subsequent imaging. 2. Small LEFT upper lobe nodule is essentially resolved, in the area of this nodule in the LEFT upper lobe there is only minimal architectural distortion without visible residual lesion. 3. Marked pulmonary emphysema. 4. Midthoracic compression fractures and associated kyphosis as before. 5. Emphysema and aortic atherosclerosis.

## 2020-11-22 NOTE — Assessment & Plan Note (Signed)
-  Stable. -Continue current medication regimen. -Will continue to monitor alongside cardiology.

## 2020-11-22 NOTE — Patient Instructions (Signed)
Arthritis Arthritis is a term that is commonly used to refer to joint pain or jointdisease. There are more than 100 types of arthritis. What are the causes? The most common cause of this condition is wear and tear of a joint. Other causes include: Gout. Inflammation of a joint. An infection of a joint. Sprains and other injuries near the joint. A reaction to medicines or drugs, or an allergic reaction. In some cases, the cause may not be known. What are the signs or symptoms? The main symptom of this condition is pain in the joint during movement. Other symptoms include: Redness, swelling, or stiffness at a joint. Warmth coming from the joint. Fever. Overall feeling of illness. How is this diagnosed? This condition may be diagnosed with a physical exam and tests, including: Blood tests. Urine tests. Imaging tests, such as X-rays, an MRI, or a CT scan. Sometimes, fluid is removed from a joint for testing. How is this treated? This condition may be treated with: Treatment of the cause, if it is known. Rest. Raising (elevating) the joint. Applying cold or hot packs to the joint. Medicines to improve symptoms and reduce inflammation. Injections of a steroid such as cortisone into the joint to help reduce pain and inflammation. Depending on the cause of your arthritis, you may need to make lifestyle changes to reduce stress on your joint. Changes may include: Exercising more. Losing weight. Follow these instructions at home: Medicines Take over-the-counter and prescription medicines only as told by your health care provider. Do not take aspirin to relieve pain if your health care provider thinks that gout may be causing your pain. Activity Rest your joint if told by your health care provider. Rest is important when your disease is active and your joint feels painful, swollen, or stiff. Avoid activities that make the pain worse. It is important to balance activity with  rest. Exercise your joint regularly with range-of-motion exercises as told by your health care provider. Try doing low-impact exercise, such as: Swimming. Water aerobics. Biking. Walking. Managing pain, stiffness, and swelling     If directed, put ice on the joint. Put ice in a plastic bag. Place a towel between your skin and the bag. Leave the ice on for 20 minutes, 2-3 times per day. If your joint is swollen, raise (elevate) it above the level of your heart if directed by your health care provider. If your joint feels stiff in the morning, try taking a warm shower. If directed, apply heat to the affected area as often as told by your health care provider. Use the heat source that your health care provider recommends, such as a moist heat pack or a heating pad. If you have diabetes, do not apply heat without permission from your health care provider. To apply heat: Place a towel between your skin and the heat source. Leave the heat on for 20-30 minutes. Remove the heat if your skin turns bright red. This is especially important if you are unable to feel pain, heat, or cold. You may have a greater risk of getting burned. General instructions Do not use any products that contain nicotine or tobacco, such as cigarettes, e-cigarettes, and chewing tobacco. If you need help quitting, ask your health care provider. Keep all follow-up visits as told by your health care provider. This is important. Contact a health care provider if: The pain gets worse. You have a fever. Get help right away if: You develop severe joint pain, swelling, or redness. Many joints  become painful and swollen. You develop severe back pain. You develop severe weakness in your leg. You cannot control your bladder or bowels. Summary Arthritis is a term that is commonly used to refer to joint pain or joint disease. There are more than 100 types of arthritis. The most common cause of this condition is wear and tear of a  joint. Other causes include gout, inflammation or infection of the joint, sprains, or allergies. Symptoms of this condition include redness, swelling, or stiffness of the joint. Other symptoms include warmth, fever, or feeling ill. This condition is treated with rest, elevation, medicines, and applying cold or hot packs. Follow your health care provider's instructions about medicines, activity, exercises, and other home care treatments. This information is not intended to replace advice given to you by your health care provider. Make sure you discuss any questions you have with your healthcare provider. Document Revised: 04/11/2018 Document Reviewed: 04/11/2018 Elsevier Patient Education  2022 [employer].

## 2020-11-29 ENCOUNTER — Other Ambulatory Visit: Payer: Self-pay | Admitting: *Deleted

## 2020-11-29 NOTE — Patient Outreach (Signed)
[facility]) Care Management  11/29/2020  GLYNIS HUNSUCKER [birth year] [id number]  [facility] outreach to complex care patient (Coverage for Vil[facility] RN CM)   Successful outreach  Patient is able to verify HIPAA ([place] and Accountability Act) identifiers Reviewed and addressed the purpose of the follow up call with the patient  Consent: [facility] ([place]) RN CM reviewed [facility] services with patient. Patient gave verbal consent for services.  Assessment State she is doing well  She reports she is "not improving but not getting worst" She still reports some dyspnea on exertion  She confirms having someone to assist in cleaning her home which helps to conserve her energy She confirms attendance of recent pulmonology (10/23/20) and primary care provider (PCP-11/22/20) appointments that went well She reports she is taking her medicines as ordered for onychomycosis and osteoarthritis that was addressed at the pcp She was able to update RN CM of her upcoming MD appointments on her calendar  Plan Assigned RN CM Lattie Haw will follow up next month as agreed per patient  Goals Addressed               This Visit's Progress     Patient Stated     THN-Track and Manage My Symptoms-COPD (pt-stated)   On track     Follow Up Date 11/19/2020 Timeframe:  Long-Range Goal Priority:  Medium Start Date:     06/21/2020                        Expected End Date:     12/13/2020                 Barriers: Health Behaviors  - begin a symptom diary - bring symptom diary to all visits - develop a rescue plan - eliminate symptom triggers at home - follow rescue plan if symptoms flare-up - keep follow-up appointments - use an extra pillow to sleep    Notes:  11/29/20 reports she continues to track and manage her COPD symptoms well at home Confirmed pulmonology office visit with concerns. Denies care coordination needs or disease management needs today appreciative of  outreach 6/3-Pt doing well with no acute flare-ups and continue to take all her medication and uses her home O2 24/7 with no incidents over the last month. Will continue encouraged adherence to the discussed plan of care. 2/4-Developed action plan for long term goals as pt receptive and will comply with all discussed for his goals and interventions mentioned above. Awareness of all future appointments as confirmed today.      THN-Track and Manage My Triggers-COPD (pt-stated)   On track     Follow Up Date  12/13/2020 Timeframe:  Short-Term Goal Priority:  Medium Start Date:    06/21/2020                         Expected End Date:    12/13/2020                   Barriers: Health Behaviors  - avoid second hand smoke - eliminate smoking in my home - identify and avoid work-related triggers - identify and remove indoor air pollutants - limit outdoor activity during cold weather - listen for public air quality announcements every day   Notes:   11/29/20 reports she continues to avoid triggers, track and manage her COPD symptoms well at home. Confirmed pulmonology office visit  with concerns. Denies care coordination needs or disease management needs today appreciative of outreach 6/3-Pt continues to avoid triggers and prohibits smokers in her home that may cause an exacerbation episode. Pt mostly stays inside to avoid pollutants that may cause a flare-up. Pt remains on her 3 lts of home O2 24/7 as recommended. 4/26-Pt reports issues with her breathing at times however resolved with doses of Laxis permitted by her pulmonologist. Pt takes 2 pills (40 mg) for a few days and remains on her 3 liters of home O2. Denies any swelling today and however limited with her mobility she reports no additional SOB today. Pt aware she is able to wean to 1 dose of Laxis if needed and 4 liters due to the long tubing for full oxygenation however to check with her provider to long term usage of the 4 liters. Pt is in the GREEN  zone today without symptoms. 3/22-Pt verifies she continues to do well with no acute symptoms since the last conversation and pt continue to be safe in her home.  Supportive assistance from her family when needed. Pt received the [facility] packet as RN encouraged pt to review the COPD tab for ongoing education and reiterated on the importance of managing this condition.  2/18-Reiterated on the same plan of care as pt remains on track with no acute symptoms as discussed. Review the plan of care for pt's knowledge base. 2/4-Discussed the plan of care once again and what to avoid that would risk flare ups and exacerbated episodes. Pt aware of several risk and continue to avoid.          Wai Minotti L. Lavina Hamman, RN, BSN, [place] Coordinator Office number [phone] [facility] number [phone] Fax number [phone]

## 2020-12-09 ENCOUNTER — Other Ambulatory Visit: Payer: Medicare Other

## 2020-12-09 ENCOUNTER — Encounter: Payer: Self-pay | Admitting: Physician Assistant

## 2020-12-09 ENCOUNTER — Other Ambulatory Visit: Payer: Self-pay | Admitting: Physician Assistant

## 2020-12-09 ENCOUNTER — Other Ambulatory Visit: Payer: Self-pay

## 2020-12-09 DIAGNOSIS — N1832 Chronic kidney disease, stage 3b: Secondary | ICD-10-CM | POA: Diagnosis not present

## 2020-12-09 MED ORDER — HYDROXYZINE HCL 10 MG PO TABS: 10.0000 mg | ORAL_TABLET | Freq: Every evening | ORAL | 0 refills | Status: DC | PRN

## 2020-12-10 LAB — RENAL FUNCTION PANEL
Albumin: 4.5 g/dL (ref 3.6–4.6)
BUN/Creatinine Ratio: 17 (ref 12–28)
BUN: 26 mg/dL (ref 8–27)
CO2: 32 mmol/L — ABNORMAL HIGH (ref 20–29)
Calcium: 9.8 mg/dL (ref 8.7–10.3)
Chloride: 98 mmol/L (ref 96–106)
Creatinine, Ser: 1.53 mg/dL — ABNORMAL HIGH (ref 0.57–1.00)
Glucose: 101 mg/dL — ABNORMAL HIGH (ref 65–99)
Phosphorus: 3.3 mg/dL (ref 3.0–4.3)
Potassium: 4.3 mmol/L (ref 3.5–5.2)
Sodium: 145 mmol/L — ABNORMAL HIGH (ref 134–144)
eGFR: 33 mL/min/{1.73_m2} — ABNORMAL LOW (ref >59)

## 2020-12-10 LAB — MAGNESIUM: Magnesium: 2.2 mg/dL (ref 1.6–2.3)

## 2020-12-10 LAB — PARATHYROID HORMONE, INTACT (NO CA): PTH: 62 pg/mL (ref 15–65)

## 2020-12-10 LAB — PROTEIN / CREATININE RATIO, URINE
Creatinine, Urine: 15.2 mg/dL
Protein, Ur: 7 mg/dL
Protein/Creat Ratio: 461 mg/g creat — ABNORMAL HIGH (ref 0–200)

## 2020-12-10 LAB — HEMOGLOBIN: Hemoglobin: 15.9 g/dL (ref 11.1–15.9)

## 2020-12-10 LAB — VITAMIN D 25 HYDROXY (VIT D DEFICIENCY, FRACTURES): Vit D, 25-Hydroxy: 63.1 ng/mL (ref 30.0–100.0)

## 2020-12-10 NOTE — Telephone Encounter (Signed)
Rx was sent for hydroxyzine HCL which is hydroxyzine hydrochloride.   Thank you, Herb Grays

## 2020-12-17 DIAGNOSIS — N1330 Unspecified hydronephrosis: Secondary | ICD-10-CM | POA: Diagnosis not present

## 2020-12-17 DIAGNOSIS — I129 Hypertensive chronic kidney disease with stage 1 through stage 4 chronic kidney disease, or unspecified chronic kidney disease: Secondary | ICD-10-CM | POA: Diagnosis not present

## 2020-12-17 DIAGNOSIS — N200 Calculus of kidney: Secondary | ICD-10-CM | POA: Diagnosis not present

## 2020-12-17 DIAGNOSIS — N1832 Chronic kidney disease, stage 3b: Secondary | ICD-10-CM | POA: Diagnosis not present

## 2020-12-17 DIAGNOSIS — J9611 Chronic respiratory failure with hypoxia: Secondary | ICD-10-CM | POA: Diagnosis not present

## 2020-12-17 DIAGNOSIS — R809 Proteinuria, unspecified: Secondary | ICD-10-CM | POA: Diagnosis not present

## 2020-12-24 ENCOUNTER — Ambulatory Visit (INDEPENDENT_AMBULATORY_CARE_PROVIDER_SITE_OTHER): Payer: Medicare Other | Admitting: Nurse Practitioner

## 2020-12-24 ENCOUNTER — Other Ambulatory Visit: Payer: Self-pay

## 2020-12-24 ENCOUNTER — Encounter: Payer: Self-pay | Admitting: Nurse Practitioner

## 2020-12-24 VITALS — Ht 64.0 in | Wt 115.0 lb

## 2020-12-24 DIAGNOSIS — R0602 Shortness of breath: Secondary | ICD-10-CM

## 2020-12-24 DIAGNOSIS — J441 Chronic obstructive pulmonary disease with (acute) exacerbation: Secondary | ICD-10-CM

## 2020-12-24 DIAGNOSIS — R059 Cough, unspecified: Secondary | ICD-10-CM

## 2020-12-24 DIAGNOSIS — J0140 Acute pansinusitis, unspecified: Secondary | ICD-10-CM | POA: Diagnosis not present

## 2020-12-24 MED ORDER — GUAIFENESIN-CODEINE 100-10 MG/5ML PO SYRP: 5.0000 mL | ORAL_SOLUTION | Freq: Two times a day (BID) | ORAL | 0 refills | Status: DC | PRN

## 2020-12-24 MED ORDER — IPRATROPIUM-ALBUTEROL 0.5-2.5 (3) MG/3ML IN SOLN: 3.0000 mL | RESPIRATORY_TRACT | 3 refills | Status: DC | PRN

## 2020-12-24 MED ORDER — DOXYCYCLINE HYCLATE 100 MG PO TABS: 100.0000 mg | ORAL_TABLET | Freq: Two times a day (BID) | ORAL | 0 refills | Status: DC

## 2020-12-24 NOTE — Progress Notes (Signed)
Virtual Visit via Telephone Note  I connected with [name] on 12/24/20 at  1:30 PM EDT by telephone and verified that I am speaking with the correct person using two identifiers.  Location: Patient: home Provider: [place] [facility]     I discussed the limitations, risks, security and privacy concerns of performing an evaluation and management service by telephone and the availability of in person appointments. I also discussed with the patient that there may be a patient responsible charge related to this service. The patient expressed understanding and agreed to proceed.   History of Present Illness: Patient states that she has chest and head congestion.  She also has loose, nonproductive cough.  She denies fever, nausea, or vomiting.  She states that she gets a headache which can be pretty severe especially when she coughs.  She does have COPD.  She uses Stiolto daily.  She has used her rescue inhaler once today.  States she has not felt the need to use it beyond 1 time.  She does have a nebulizer but does not have solution to use for that at this time.  Patient is on nasal cannula oxygen at all times.  She states she took 2 at home COVID tests.  One was yesterday when was this morning.  Both were negative.   Observations/Objective:  The patient is alert and oriented. She is pleasant and answers all questions appropriately. Breathing is non-labored. She is in no acute distress at this time.  She sounds nasally congested.  She has a loose sounding, nonproductive cough during our phone call today.  Today's Vitals   12/24/20 1317  Weight: 115 lb (52.2 kg)  Height: _0  (1.626 m)   Body mass index is 19.74 kg/m.   Assessment and Plan: 1. Acute non-recurrent pansinusitis Will start doxycycline 100 mg tablets twice daily for next 7 days. Rest and increase fluids. Continue using OTC medication to control symptoms.   - doxycycline (VIBRA-TABS) 100 MG tablet; Take 1  tablet (100 mg total) by mouth 2 (two) times daily.  Dispense: 14 tablet; Refill: 0  2. Chronic obstructive pulmonary disease with acute exacerbation ([place]) She should continue using Stiolto daily and rescue inhaler as needed and as prescribed.  We will add DuoNeb nebulizer solution.  She may use 1 vial every 4 hours as needed for shortness of breath and wheezing.  3. Shortness of breath We will add DuoNeb nebulizer solution.  She may use 1 vial every 4 hours as needed for shortness of breath and wheezing. - ipratropium-albuterol (DUONEB) 0.5-2.5 (3) MG/3ML SOLN; Take 3 mLs by nebulization every 4 (four) hours as needed.  Dispense: 150 mL; Refill: 3  4. Cough Cheratussin prescription sent to her pharmacy.  She may use this twice daily as needed for cough.  Advised her to use this with caution as it may cause dizziness and/or drowsiness.  She voiced understanding and agreement. - guaiFENesin-codeine (ROBITUSSIN AC) 100-10 MG/5ML syrup; Take 5 mLs by mouth 2 (two) times daily as needed for cough.  Dispense: 120 mL; Refill: 0   Follow Up Instructions:    I discussed the assessment and treatment plan with the patient. The patient was provided an opportunity to ask questions and all were answered. The patient agreed with the plan and demonstrated an understanding of the instructions.   The patient was advised to call back or seek an in-person evaluation if the symptoms worsen or if the condition fails to improve  as anticipated.  I provided 15 minutes of non-face-to-face time during this encounter.   Ronnell Freshwater, NP

## 2020-12-25 ENCOUNTER — Ambulatory Visit: Payer: Medicare Other | Admitting: Emergency Medicine

## 2020-12-30 ENCOUNTER — Ambulatory Visit: Payer: Medicare Other | Admitting: *Deleted

## 2021-01-01 ENCOUNTER — Other Ambulatory Visit: Payer: Self-pay | Admitting: *Deleted

## 2021-01-01 NOTE — Patient Outreach (Signed)
[facility]) Care Management  01/01/2021  SHANDRICKA MONROY November 27, 1929 [id number]   Telephone Assessment-Successful-COPD  RN spoke with pt today and review the current plan of are along with current issues. All issues noted with the plan of care. Discussed activities increase if tolerable to loosen up the thick secretions with mobility or stream from the shower by sitting on the commode again only if tolerable. Pt aware of when to use the life alert with any emergencies. Pt remains in the GREEN zone with no acute issues related to her COPD at this time. Pt's congestions is intermittent at this time.  Will update provider on pt's disposition with [facility] services and follow up in a few weeks on pt's ongoing progress with her medical needs.   Goals Addressed               This Visit's Progress     THN-Track and Manage My Symptoms-COPD (pt-stated)   On track     Follow Up Date 01/30/2021 Timeframe:  Long-Range Goal Priority:  Medium Start Date:     06/21/2020                        Expected End Date:     04/16/2021                 Barriers: Health Behaviors  - begin a symptom diary - bring symptom diary to all visits - develop a rescue plan - eliminate symptom triggers at home - follow rescue plan if symptoms flare-up - keep follow-up appointments - use an extra pillow to sleep    Why is this important?   Tracking your symptoms and other information about your health helps your doctor plan your care.  Write down the symptoms, the time of day, what you were doing and what medicine you are taking.  You will soon learn how to manage your symptoms.     Notes:  11/29/20 reports she continues to track and manage her COPD symptoms well at home Confirmed pulmonology office visit with concerns. Denies care coordination needs or disease management needs today appreciative of outreach 6/3-Pt doing well with no acute flare-ups and continue to take all her medication and uses her  home O2 24/7 with no incidents over the last month. Will continue encouraged adherence to the discussed plan of care. 2/4-Developed action plan for long term goals as pt receptive and will comply with all discussed for his goals and interventions mentioned above. Awareness of all future appointments as confirmed today.      THN-Track and Manage My Triggers-COPD (pt-stated)   On track     Follow Up Date  01/30/2021 Timeframe:  Short-Term Goal Priority:  Medium Start Date:    06/21/2020                         Expected End Date:    02/14/2021                   Barriers: Health Behaviors  - avoid second hand smoke - eliminate smoking in my home - identify and avoid work-related triggers - identify and remove indoor air pollutants - limit outdoor activity during cold weather - listen for public air quality announcements every day    Why is this important?   Triggers are activities or things, like tobacco smoke or cold weather, that make your COPD (chronic obstructive pulmonary disease) flare-up.  Knowing these triggers helps you plan how to stay away from them.  When you cannot remove them, you can learn how to manage them.     Notes:  8/17-Reports some ongoing congestion after completing antibiotics. Pt has cough medicine if needed. Pt continue to be in the GREEN zone however in/out of congestions with thick mucus that is white in coloration when present. Pt saturations in the 90's at rest and 70's with any exertion. Supportive son to assist and pt has life alert for any emergencies.  11/29/20 reports she continues to avoid triggers, track and manage her COPD symptoms well at home. Confirmed pulmonology office visit with concerns. Denies care coordination needs or disease management needs today appreciative of outreach 6/3-Pt continues to avoid triggers and prohibits smokers in her home that may cause an exacerbation episode. Pt mostly stays inside to avoid pollutants that may cause a flare-up. Pt  remains on her 3 lts of home O2 24/7 as recommended. 4/26-Pt reports issues with her breathing at times however resolved with doses of Laxis permitted by her pulmonologist. Pt takes 2 pills (40 mg) for a few days and remains on her 3 liters of home O2. Denies any swelling today and however limited with her mobility she reports no additional SOB today. Pt aware she is able to wean to 1 dose of Laxis if needed and 4 liters due to the long tubing for full oxygenation however to check with her provider provider to long term usage of the 4 liters. Pt is in the GREEN zone today without symptoms. 3/22-Pt verifies she continues to do well with no acute symptoms since the last conversation and pt continue to be safe in her home.  Supportive assistance from her family when needed. Pt received the [facility] packet as RN encouraged pt to review the COPD tab for ongoing education and reiterated on the importance of managing this condition.  2/18-Reiterated on the same plan of care as pt remains on track with no acute symptoms as discussed. Review the plan of care for pt's knowledge base. 2/4-Discussed the plan of care once again and what to avoid that would risk flare ups and exacerbated episodes. Pt aware of several risk and continue to avoid.          Raina Mina, RN Care Management Coordinator [place] Office [phone]

## 2021-01-02 ENCOUNTER — Other Ambulatory Visit: Payer: Self-pay | Admitting: Physician Assistant

## 2021-01-02 ENCOUNTER — Ambulatory Visit (INDEPENDENT_AMBULATORY_CARE_PROVIDER_SITE_OTHER): Payer: Medicare Other

## 2021-01-02 ENCOUNTER — Other Ambulatory Visit: Payer: Self-pay

## 2021-01-02 ENCOUNTER — Encounter: Payer: Self-pay | Admitting: Physician Assistant

## 2021-01-02 DIAGNOSIS — R140 Abdominal distension (gaseous): Secondary | ICD-10-CM

## 2021-01-02 DIAGNOSIS — J441 Chronic obstructive pulmonary disease with (acute) exacerbation: Secondary | ICD-10-CM

## 2021-01-02 DIAGNOSIS — R600 Localized edema: Secondary | ICD-10-CM

## 2021-01-02 DIAGNOSIS — I700 Atherosclerosis of aorta: Secondary | ICD-10-CM | POA: Diagnosis not present

## 2021-01-02 DIAGNOSIS — N1832 Chronic kidney disease, stage 3b: Secondary | ICD-10-CM | POA: Diagnosis not present

## 2021-01-02 DIAGNOSIS — N132 Hydronephrosis with renal and ureteral calculous obstruction: Secondary | ICD-10-CM | POA: Diagnosis not present

## 2021-01-02 DIAGNOSIS — R059 Cough, unspecified: Secondary | ICD-10-CM

## 2021-01-02 MED ORDER — AZITHROMYCIN 250 MG PO TABS: ORAL_TABLET | ORAL | 0 refills | Status: AC

## 2021-01-02 MED ORDER — GUAIFENESIN-CODEINE 100-10 MG/5ML PO SYRP: 5.0000 mL | ORAL_SOLUTION | Freq: Two times a day (BID) | ORAL | 0 refills | Status: DC | PRN

## 2021-01-07 ENCOUNTER — Other Ambulatory Visit: Payer: Self-pay | Admitting: Physician Assistant

## 2021-01-14 ENCOUNTER — Encounter: Payer: Self-pay | Admitting: Physician Assistant

## 2021-01-14 DIAGNOSIS — R600 Localized edema: Secondary | ICD-10-CM

## 2021-01-14 MED ORDER — TORSEMIDE 10 MG PO TABS: 10.0000 mg | ORAL_TABLET | Freq: Every day | ORAL | 0 refills | Status: DC | PRN

## 2021-01-16 ENCOUNTER — Other Ambulatory Visit: Payer: Self-pay | Admitting: Emergency Medicine

## 2021-01-23 ENCOUNTER — Other Ambulatory Visit: Payer: Self-pay

## 2021-01-23 ENCOUNTER — Encounter: Payer: Self-pay | Admitting: Emergency Medicine

## 2021-01-23 ENCOUNTER — Ambulatory Visit: Payer: Medicare Other | Admitting: Emergency Medicine

## 2021-01-23 DIAGNOSIS — C3431 Malignant neoplasm of lower lobe, right bronchus or lung: Secondary | ICD-10-CM | POA: Diagnosis not present

## 2021-01-23 DIAGNOSIS — J449 Chronic obstructive pulmonary disease, unspecified: Secondary | ICD-10-CM | POA: Diagnosis not present

## 2021-01-23 DIAGNOSIS — J9611 Chronic respiratory failure with hypoxia: Secondary | ICD-10-CM

## 2021-01-23 NOTE — Patient Instructions (Addendum)
Continue Stiolto 2 puffs once daily. Keep your albuterol available to use either 1 nebulizer treatment or 2 puffs up to every 4 hours if needed for shortness of breath, chest tightness, wheezing. Continue prednisone 20 mg once daily. Continue your oxygen at 3 L/min pulsed at rest.  You may need to turn your oxygen up to 4 or 5 L/min pulsed when you are up exerting herself.  Our goal is to keep your oxygen saturations greater than 90% is much as possible. Get your repeat CT scan of the chest as planned by Dr. Sondra Come and follow with him to review. Follow with Dr Lamonte Sakai in 6 months or sooner if you have any problems

## 2021-01-23 NOTE — Progress Notes (Signed)
Subjective:    Patient ID: [name], female    DOB: [birth year], 87 y.o.   MRN: [id number]  HPI  ROV 10/23/20 --this follow-up visit for 87 year old woman with history of severe obstructive lung disease, pleural plaques with pulmonary nodular disease (documented asbestos exposure).  She has hypoxemic respiratory failure.  Also followed by cardiology for A. fib, hypertension and aortic valve replacement.  She was having persistent dyspnea, cough when I saw her last month.  We gave a prednisone taper down to 20 mg.  She has stayed on this dose.  Her previous baseline had been 10 mg.  She reports today that she is unsure how much the taper improved her. She does believe that the higher doses of pred helped her chest tightness. She ran out of pred last week, did not refill, has had more SOB and chest tightness since then. She is on stiolto, wants another albuterol inhaler to carry in her purse. Her O2 is on 3L/min. No sputum, flaring sx.   ROV 01/23/21 --pleasant 87 year old woman who has a history of severe COPD as well as abnormal chest imaging with pleural plaques and pulmonary nodular disease in the setting of known asbestos exposure.  She has history of non-small cell lung cancer that was treated with SBRT and without any evidence of recurrence on most recent imaging.  She has associated hypoxemic respiratory failure on 3 L/min.  Other past medical history significant for atrial fibrillation, hypertension, aortic valve replacement.  She has been prednisone dependent, previously on maintenance of 10 mg but was increased to maintenance 20 mg earlier this year.  She does believe that she has benefited.  She is maintained on Stiolto and believes that it benefits her as well.  She has albuterol, uses about 1-3x a day. She has exertional SOB even w minimal activity. She has intermittent chest congestion, was treated with doxy and then azithro.   CT chest 09/13/2020 reviewed by me, shows postradiation changes  around the right lower lobe nodule, now smaller.  Left upper lobe nodule smaller as well, essentially resolved.   Review of Systems As per HPI     Objective:   Physical Exam Vitals:   01/23/21 1448  BP: 110/64  Pulse: 94  Temp: 98.2 F (36.8 C)  TempSrc: Oral  SpO2: 94%  Weight: 113 lb 12.8 oz (51.6 kg)  Height: 5' 4" (1.626 m)   Gen: Pleasant, thin, in no distress,  normal affect, in wheelchair with oxygen in place  ENT: No lesions,  mouth clear,  oropharynx clear, no postnasal drip  Neck: No JVD, no stridor  Lungs: No use of accessory muscles, very distant, no wheeze or crackles.   Cardiovascular: RRR, heart sounds normal, no murmur or gallops, no peripheral edema  Musculoskeletal: No deformities, no cyanosis or clubbing  Neuro: alert, awake, non focal  Skin: Warm, no lesions or rash     Assessment & Plan:  COPD (chronic obstructive pulmonary disease) ([place]) I had originally considered possibly decreasing her prednisone down to 15 mg daily at this visit depending on her clinical status.  She is using more albuterol, has significant exertional dyspnea.  I think that it makes more sense to leave the prednisone at 20 mg daily as I do not want her to backtrack.  Discussed this with her today.  Continue her other regimen as below.  Continue Stiolto 2 puffs once daily. Keep your albuterol available to use either 1 nebulizer treatment or 2 puffs up to  every 4 hours if needed for shortness of breath, chest tightness, wheezing. Continue prednisone 20 mg once daily. Follow with Dr Lamonte Sakai in 6 months or sooner if you have any problems  Chronic respiratory failure with hypoxia ([place]) Likely need to uptitrate her oxygen with exertion.  Explained this to her  Continue your oxygen at 3 L/min pulsed at rest.  You may need to turn your oxygen up to 4 or 5 L/min pulsed when you are up exerting herself.  Our goal is to keep your oxygen saturations greater than 90% is much as  possible..  Lung cancer [facility]) Get your repeat CT scan of the chest as planned by Dr. Sondra Come and follow with him to review  Baltazar Apo, MD, PhD 01/23/2021, 3:13 PM [facility] [phone] or if no answer [phone]

## 2021-01-23 NOTE — Assessment & Plan Note (Signed)
I had originally considered possibly decreasing her prednisone down to 15 mg daily at this visit depending on her clinical status.  She is using more albuterol, has significant exertional dyspnea.  I think that it makes more sense to leave the prednisone at 20 mg daily as I do not want her to backtrack.  Discussed this with her today.  Continue her other regimen as below.  Continue Stiolto 2 puffs once daily. Keep your albuterol available to use either 1 nebulizer treatment or 2 puffs up to every 4 hours if needed for shortness of breath, chest tightness, wheezing. Continue prednisone 20 mg once daily. Follow with Dr Lamonte Sakai in 6 months or sooner if you have any problems

## 2021-01-23 NOTE — Assessment & Plan Note (Signed)
Get your repeat CT scan of the chest as planned by Dr. Sondra Come and follow with him to review

## 2021-01-23 NOTE — Assessment & Plan Note (Signed)
Likely need to uptitrate her oxygen with exertion.  Explained this to her  Continue your oxygen at 3 L/min pulsed at rest.  You may need to turn your oxygen up to 4 or 5 L/min pulsed when you are up exerting herself.  Our goal is to keep your oxygen saturations greater than 90% is much as possible.Marland Kitchen

## 2021-01-30 ENCOUNTER — Other Ambulatory Visit: Payer: Self-pay | Admitting: *Deleted

## 2021-01-30 NOTE — Patient Outreach (Signed)
[place] [facility]) Care Management  01/30/2021  [name] October 20, 1929 [id number]   Telephone Assessment-Unsuccessful   RN attempted outreach call however unsuccessful. RN able to leave a HIPA approved voice message requesting a call back.  Will follow up once again over the next week for ongoing case management services.  Raina Mina, RN Care Management Coordinator [place] Office [phone]

## 2021-02-04 ENCOUNTER — Other Ambulatory Visit: Payer: Self-pay | Admitting: *Deleted

## 2021-02-04 NOTE — Patient Outreach (Addendum)
[facility]) Care Management  02/04/2021  [name] 29-Sep-1929 [id number]   Telephone Assessment-Successful-COPD  RN spoke with pt today and received an update on her discussed plan of care. Pt states she is doing much better and remains in the GREEN zone with no acute issues at this time. Changes related to home O2 on 4 liters now and her provider is aware.   Will follow up next month if pt continue to do well with place on quarterly and/or discharged possible health coach.   Goals Addressed               This Visit's Progress     THN-Track and Manage My Symptoms-COPD (pt-stated)   On track     Follow Up Date 03/05/2021 Timeframe:  Long-Range Goal Priority:  Medium Start Date:     06/21/2020                        Expected End Date:     04/16/2021                 Barriers: Health Behaviors  - begin a symptom diary - bring symptom diary to all visits - develop a rescue plan - eliminate symptom triggers at home - follow rescue plan if symptoms flare-up - keep follow-up appointments - use an extra pillow to sleep    Why is this important?   Tracking your symptoms and other information about your health helps your doctor plan your care.  Write down the symptoms, the time of day, what you were doing and what medicine you are taking.  You will soon learn how to manage your symptoms.     Notes:  11/29/20 reports she continues to track and manage her COPD symptoms well at home Confirmed pulmonology office visit with concerns. Denies care coordination needs or disease management needs today appreciative of outreach 6/3-Pt doing well with no acute flare-ups and continue to take all her medication and uses her home O2 24/7 with no incidents over the last month. Will continue encouraged adherence to the discussed plan of care. 2/4-Developed action plan for long term goals as pt receptive and will comply with all discussed for his goals and interventions mentioned  above. Awareness of all future appointments as confirmed today.      THN-Track and Manage My Triggers-COPD (pt-stated)   On track     Follow Up Date  03/05/2021 Timeframe:  Short-Term Goal Priority:  Medium Start Date:    06/21/2020                         Expected End Date:    03/16/2021                   Barriers: Health Behaviors  - avoid second hand smoke - eliminate smoking in my home - identify and avoid work-related triggers - identify and remove indoor air pollutants - limit outdoor activity during cold weather - listen for public air quality announcements every day    Why is this important?   Triggers are activities or things, like tobacco smoke or cold weather, that make your COPD (chronic obstructive pulmonary disease) flare-up.  Knowing these triggers helps you plan how to stay away from them.  When you cannot remove them, you can learn how to manage them.     Notes:  9/20-Pt reports she remains in the GREEN zone with no  residual symptoms experienced. Denies any additional congestions or thick mucus however pt is on 4 liters of home O2 now (MD aware). 8/17-Reports some ongoing congestion after completing antibiotics. Pt has cough medicine if needed. Pt continue to be in the GREEN zone however in/out of congestions with thick mucus that is white in coloration when present. Pt saturations in the 90's at rest and 70's with any exertion. Supportive son to assist and pt has life alert for any emergencies.  11/29/20 reports she continues to avoid triggers, track and manage her COPD symptoms well at home. Confirmed pulmonology office visit with concerns. Denies care coordination needs or disease management needs today appreciative of outreach 6/3-Pt continues to avoid triggers and prohibits smokers in her home that may cause an exacerbation episode. Pt mostly stays inside to avoid pollutants that may cause a flare-up. Pt remains on her 3 lts of home O2 24/7 as recommended. 4/26-Pt  reports issues with her breathing at times however resolved with doses of Laxis permitted by her pulmonologist. Pt takes 2 pills (40 mg) for a few days and remains on her 3 liters of home O2. Denies any swelling today and however limited with her mobility she reports no additional SOB today. Pt aware she is able to wean to 1 dose of Laxis if needed and 4 liters due to the long tubing for full oxygenation however to check with her provider provider to long term usage of the 4 liters. Pt is in the GREEN zone today without symptoms. 3/22-Pt verifies she continues to do well with no acute symptoms since the last conversation and pt continue to be safe in her home.  Supportive assistance from her family when needed. Pt received the [facility] packet as RN encouraged pt to review the COPD tab for ongoing education and reiterated on the importance of managing this condition.  2/18-Reiterated on the same plan of care as pt remains on track with no acute symptoms as discussed. Review the plan of care for pt's knowledge base. 2/4-Discussed the plan of care once again and what to avoid that would risk flare ups and exacerbated episodes. Pt aware of several risk and continue to avoid.          Raina Mina, RN Care Management Coordinator [place] Office [phone]

## 2021-02-08 ENCOUNTER — Ambulatory Visit (INDEPENDENT_AMBULATORY_CARE_PROVIDER_SITE_OTHER): Payer: Medicare Other

## 2021-02-08 ENCOUNTER — Other Ambulatory Visit: Payer: Self-pay

## 2021-02-08 DIAGNOSIS — Z0000 Encounter for general adult medical examination without abnormal findings: Secondary | ICD-10-CM

## 2021-02-08 NOTE — Progress Notes (Signed)
Subjective:   [name] is a 87 y.o. female who presents for Medicare Annual (Subsequent) preventive examination.I connected with  [name] on 02/08/21 by a audio enabled telemedicine application and verified that I am speaking with the correct person using two identifiers.   I discussed the limitations of evaluation and management by telemedicine. The patient expressed understanding and agreed to proceed.  Location of [place]: home Location of Provider:office Persons participating in vist: [place]    Defer to PCP Cardiac Risk Factors include: hypertension;advanced age (>44mn, >>45women)     Objective:    There were no vitals filed for this visit. There is no height or weight on file to calculate BMI.  Advanced Directives 02/08/2021 09/16/2020 06/21/2020 06/17/2020 06/09/2020 06/09/2020 04/18/2020  Does Patient Have a Medical Advance Directive? _0  Yes No  Type of Advance Directive Living will;Out of facility DNR (pink MOST or yellow form) - HSpecial educational needs te[place]f [place] Living [place] -  Does patient want to make changes to medical advance directive? No - Patient declined No - Patient declined - - No - Patient declined No - Patient declined -  Copy of [place] Chart? - - - No - copy requested No - copy requested No - copy requested, Physician notified -  Would patient like information on creating a medical advance directive? - - - - - - -  Pre-existing out of facility DNR order (yellow form or pink MOST form) - - - - - - -    Current Medications (verified) Outpatient Encounter Medications as of 02/08/2021  Medication Sig   acetaminophen (TYLENOL) 500 MG tablet Take 500 mg by mouth every 6 (six) hours as needed. Take 3 tablets every 6 hours   albuterol (PROAIR HFA) 108 (90 Base) MCG/ACT inhaler Inhale 2 puffs into the lungs every 6 (six) hours as  needed for wheezing or shortness of breath.   amLODipine (NORVASC) 5 MG tablet Take 0.5 tablets (2.5 mg total) by mouth daily.   apixaban (ELIQUIS) 2.5 MG TABS tablet Take 1 tablet (2.5 mg total) by mouth 2 (two) times daily.   EUTHYROX 112 MCG tablet Take 1 tablet by mouth once daily   furosemide (LASIX) 20 MG tablet Take 1 tablet (20 mg total) by mouth every Monday, Wednesday, and Friday.   hydrOXYzine (ATARAX/VISTARIL) 10 MG tablet TAKE 1 TABLET BY MOUTH AT BEDTIME AS NEEDED   Melatonin 10 MG TABS Take 10 mg by mouth at bedtime.   metoprolol succinate (TOPROL-XL) 25 MG 24 hr tablet Take 1 tablet (25 mg total) by mouth daily.   OXYGEN Inhale 2-3 L/min into the lungs See admin instructions. Inhale 2 L/min into the lungs continuously and 3 L/min with any exertion   potassium chloride (KLOR-CON) 10 MEQ tablet Take 1 tablet (10 mEq total) by mouth every Monday, Wednesday, and Friday.   predniSONE (DELTASONE) 20 MG tablet Take 1 tablet (20 mg total) by mouth daily with breakfast.   simvastatin (ZOCOR) 20 MG tablet TAKE 1 TABLET BY MOUTH AT BEDTIME   STIOLTO RESPIMAT 2.5-2.5 MCG/ACT AERS INHALE 2 PUFFS BY MOUTH ONCE DAILY   torsemide (DEMADEX) 10 MG tablet Take 1 tablet (10 mg total) by mouth daily as needed.   Cholecalciferol (VITAMIN D-3 PO) Take 1 capsule by mouth daily. (Patient not taking: Reported on 02/08/2021)   ciclopirox (PENLAC) 8 % solution Apply  topically at bedtime. Apply over nail and surrounding skin. Apply daily over previous coat. After seven (7) days, may remove with alcohol and continue cycle. (Patient not taking: Reported on 02/08/2021)   guaiFENesin-codeine (ROBITUSSIN AC) 100-10 MG/5ML syrup Take 5 mLs by mouth 2 (two) times daily as needed for cough. (Patient not taking: No sig reported)   ipratropium-albuterol (DUONEB) 0.5-2.5 (3) MG/3ML SOLN Take 3 mLs by nebulization every 4 (four) hours as needed. (Patient not taking: Reported on 02/08/2021)   Omega-3 Fatty Acids (FISH OIL)  1000 MG CAPS Take 1,000 mg by mouth daily. (Patient not taking: Reported on 02/08/2021)   [DISCONTINUED] diclofenac Sodium (VOLTAREN) 1 % GEL Apply 2 g topically 4 (four) times daily. (Patient not taking: Reported on 12/24/2020)   [DISCONTINUED] doxycycline (VIBRA-TABS) 100 MG tablet Take 1 tablet (100 mg total) by mouth 2 (two) times daily.   No facility-administered encounter medications on file as of 02/08/2021.    Allergies (verified) Clarithromycin, Tamsulosin, Trazodone and nefazodone, and Penicillins   History: Past Medical History:  Diagnosis Date   Allergic rhinitis    Aortic stenosis    Carotid artery disease (HCC)    Colon polyps    Congestive heart failure (CHF) (HCC)    h/o Atrial fibrillation ([place]) 03/22/2011   Brief post op    Hematuria 11/05/2015   History of radiation therapy 05/02/20-05/15/20   SBRT to Right Lung - Dr. Sondra Come   Hypercalcemia    Hypertension    Hypothyroid    Lung cancer, lower lobe ([place]) dx'd 05/2020   xrt   Osteopenia    Oxygen dependent 11/05/2015   Pressure urticaria    RLS (restless legs syndrome)    S/P aortic valve replacement and aortoplasty 04/13/2011   24m Medtronic Freestyle porcine aortic root    S/P aortic valve replacement with stentless valve 03/18/2011   Past Surgical History:  Procedure Laterality Date   AORTIC ROOT REPLACEMENT  03/18/2011   241mMedtronic Freestyle Porcine aortic root with reimplantation of coronary arteries   BREAST LUMPECTOMY     Left   CATARACT EXTRACTION     CHOLECYSTECTOMY     FOOT SURGERY     Morton's neuroma   KNEE ARTHROSCOPY     Left   Family History  Problem Relation Age of Onset   Cancer Mother        bladder   Lung cancer Father    Heart disease Maternal Grandmother    Emphysema Maternal Aunt    Asthma Maternal Aunt    Asthma Maternal Uncle    Breast cancer Sister    Uterine cancer Sister    Social History   Socioeconomic History   Marital status: Widowed    Spouse name: Not on  file   Number of children: N   Years of education: Not on file   Highest education level: Not on file  Occupational History   Occupation: housewife  Tobacco Use   Smoking status: Former    Packs/day: 0.90    Years: 62.00    Pack years: 55.80    Types: Cigarettes    Quit date: 07/14/2010    Years since quitting: 10.5   Smokeless tobacco: Never  Vaping Use   Vaping Use: Never used  Substance and Sexual Activity   Alcohol use: No    Alcohol/week: 0.0 standard drinks   Drug use: No   Sexual activity: Never  Other Topics Concern   Not on file  Social History Narrative   Not  on file   Social Determinants of Health   Financial Resource Strain: Not on file  Food Insecurity: No Food Insecurity   Worried About Charity fundraiser in the Last Year: Never true   Ran Out of Food in the Last Year: Never true  Transportation Needs: No Transportation Needs   Lack of Transportation (Medical): No   Lack of Transportation (Non-Medical): No  Physical Activity: Not on file  Stress: Not on file  Social Connections: Not on file    Tobacco Counseling Counseling given: Not Answered   Clinical Intake:  Pre-visit preparation completed: Yes  Pain : No/denies pain     Diabetes: No  How often do you need to have someone help you when you read instructions, pamphlets, or other written materials from your doctor or pharmacy?: 1 - Never What is the last grade level you completed in school?: 8th  Diabetic?No  Interpreter Needed?: No  Information entered by :: Jamyiah Labella. [place] of Daily Living In your present state of health, do you have any difficulty performing the following activities: 02/08/2021 11/22/2020  Hearing? N N  Vision? N N  Difficulty concentrating or making decisions? N Y  Walking or climbing stairs? N N  Dressing or bathing? N N  Doing errands, shopping? N N  Preparing Food and eating ? N -  Using the Toilet? N -  In the past six months, have you  accidently leaked urine? Y -  Do you have problems with loss of bowel control? N -  Managing your Medications? N -  Managing your Finances? N -  Housekeeping or managing your Housekeeping? N -  Some recent data might be hidden    Patient Care Team: [name], PA-C as PCP - General Belva Crome, MD as PCP - Cardiology (Cardiology) Rexene Alberts, MD (Inactive) (Cardiothoracic Surgery) Belva Crome, MD (Cardiology) Collene Gobble, MD as Consulting Physician (Pulmonary Disease) Magdalen Spatz, NP as Nurse Practitioner (Pulmonary Disease) Brunetta Genera, MD as Consulting Physician (Hematology) Tobi Bastos, RN as [place] Management  Indicate any recent Medical Services you may have received from other than [facility] providers in the past year (date may be approximate).     Assessment:   This is a routine wellness examination for Jariyah.  Hearing/Vision screen No results found.  Dietary issues and exercise activities discussed: Current Exercise Habits: The patient does not participate in regular exercise at present, Exercise limited by: respiratory conditions(s)   Goals Addressed   None    Depression Screen PHQ 2/9 Scores 02/08/2021 11/22/2020 06/21/2020 04/24/2020 01/05/2020 09/11/2019 07/04/2019  PHQ - 2 Score 0 0 0 0 0 3 1  PHQ- 9 Score - 5 - _0 Fall Risk Fall Risk  02/08/2021 11/22/2020 04/24/2020 01/05/2020 10/04/2019  Falls in the past year? 0 0 0 0 0  Number falls in past yr: 0 0 - - -  Injury with Fall? 0 0 - - -  Risk for fall due to : No Fall Risks No Fall Risks - - -  Follow up Falls evaluation completed Falls evaluation completed Falls evaluation completed Falls evaluation completed -    FALL RISK PREVENTION PERTAINING TO THE HOME:  Any stairs in or around the home? No  If so, are there any without handrails? No  Home free of loose throw rugs in walkways, pet beds, electrical cords, etc? Yes  Adequate lighting in your home  to reduce risk of falls? Yes   ASSISTIVE DEVICES UTILIZED TO PREVENT FALLS:  Life alert? Yes  Use of a cane, walker or w/c? No  Grab bars in the bathroom? Yes  Shower chair or bench in shower? No  Elevated toilet seat or a handicapped toilet? No   TIMED UP AND GO:  Was the test performed? No .  Length of time to ambulate N/A    Cognitive Function: MMSE - Mini Mental State Exam 11/13/2015  Orientation to time 5  Orientation to Place 5  Registration 3  Attention/ Calculation 5  Recall 3  Language- name 2 objects 2  Language- repeat 1  Language- follow 3 step command 3  Language- read & follow direction 1  Write a sentence 1  Copy design 0  Total score 29     6CIT Screen 02/08/2021 04/28/2018 02/09/2017 01/20/2017  What Year? 0 points 0 points 0 points 0 points  What month? 0 points 0 points 0 points 0 points  What time? 0 points 0 points 0 points 0 points  Count back from 20 0 points 0 points 0 points 0 points  Months in reverse 0 points 0 points 0 points 0 points  Repeat phrase 0 points 0 points 0 points 2 points  Total Score 0 0 0 2    Immunizations Immunization History  Administered Date(s) Administered   Fluad Quad(high Dose 65+) 02/01/2019   Influenza Whole 03/16/2012   Influenza, High Dose Seasonal PF 02/09/2017, 03/02/2018, 01/17/2019   Influenza,inj,Quad PF,6+ Mos 03/20/2013, 02/18/2015   Influenza-Unspecified 02/15/2014, 12/17/2015, 02/09/2017, 03/02/2018, 02/19/2020   PFIZER(Purple Top)SARS-COV-2 Vaccination 06/01/2019, 06/22/2019, 03/26/2020   Pneumococcal Conjugate-13 10/24/2013   Pneumococcal Polysaccharide-23 02/09/2017   Pneumococcal-Unspecified 09/30/2020   Tdap 11/27/2019   Zoster Recombinat (Shingrix) 07/17/2018, 02/28/2019    TDAP status: Up to date  Flu Vaccine status: Due, Education has been provided regarding the importance of this vaccine. Advised may receive this vaccine at local pharmacy or Health Dept. Aware to provide a copy of the  vaccination record if obtained from local pharmacy or Health Dept. Verbalized acceptance and understanding.  Pneumococcal vaccine status: Up to date  Covid-19 vaccine status: Information provided on how to obtain vaccines.   Qualifies for Shingles Vaccine? No   Zostavax completed No   Shingrix Completed?: Yes  Screening Tests Health Maintenance  Topic Date Due   COVID-19 Vaccine (4 - Booster for Pfizer series) 06/18/2020   INFLUENZA VACCINE  12/16/2020   TETANUS/TDAP  11/26/2029   DEXA SCAN  Completed   Zoster Vaccines- Shingrix  Completed   HPV VACCINES  Aged Out    Health Maintenance  Health Maintenance Due  Topic Date Due   COVID-19 Vaccine (4 - Booster for Pfizer series) 06/18/2020   INFLUENZA VACCINE  12/16/2020    Colorectal cancer screening: No longer required.   Mammogram status: No longer required due to age.  Bone Density status: Completed 03/24/16. Results reflect: Bone density results: OSTEOPENIA. Repeat every two years.  Lung Cancer Screening: (Low Dose CT Chest recommended if Age 76-80 years, 30 pack-year currently smoking OR have quit w/in 15years.) does qualify.   Lung Cancer Screening Referral: defer to pcp  Additional Screening:  Hepatitis C Screening: does qualify; Completed Unknown  Vision Screening: Recommended annual ophthalmology exams for early detection of glaucoma and other disorders of the eye. Is the patient up to date with their annual eye exam?  No  Who is the provider or what is the name of the  office in which the patient attends annual eye exams? N/A If pt is not established with a provider, would they like to be referred to a provider to establish care? Yes .   Dental Screening: Recommended annual dental exams for proper oral hygiene  Community Resource Referral / Chronic Care Management: CRR required this visit?  No   CCM required this visit?  No      Plan:     I have personally reviewed and noted the following in the  patient's chart:   Medical and social history Use of alcohol, tobacco or illicit drugs  Current medications and supplements including opioid prescriptions.  Functional ability and status Nutritional status Physical activity Advanced directives List of other physicians Hospitalizations, surgeries, and ER visits in previous 12 months Vitals Screenings to include cognitive, depression, and falls Referrals and appointments  In addition, I have reviewed and discussed with patient certain preventive protocols, quality metrics, and best practice recommendations. A written personalized care plan for preventive services as well as general preventive health recommendations were provided to patient.     Elyse Jarvis, [place]   02/08/2021   Nurse Notes: Non face to face  Ms. Gatson , Thank you for taking time to come for your Medicare Wellness Visit. I appreciate your ongoing commitment to your health goals. Please review the following plan we discussed and let me know if I can assist you in the future.   These are the goals we discussed:  Goals       THN-Track and Manage My Symptoms-COPD (pt-stated)      Follow Up Date 03/05/2021 Timeframe:  Long-Range Goal Priority:  Medium Start Date:     06/21/2020                        Expected End Date:     04/16/2021                 Barriers: Health Behaviors  - begin a symptom diary - bring symptom diary to all visits - develop a rescue plan - eliminate symptom triggers at home - follow rescue plan if symptoms flare-up - keep follow-up appointments - use an extra pillow to sleep    Why is this important?   Tracking your symptoms and other information about your health helps your doctor plan your care.  Write down the symptoms, the time of day, what you were doing and what medicine you are taking.  You will soon learn how to manage your symptoms.     Notes:  11/29/20 reports she continues to track and manage her COPD symptoms well at  home Confirmed pulmonology office visit with concerns. Denies care coordination needs or disease management needs today appreciative of outreach 6/3-Pt doing well with no acute flare-ups and continue to take all her medication and uses her home O2 24/7 with no incidents over the last month. Will continue encouraged adherence to the discussed plan of care. 2/4-Developed action plan for long term goals as pt receptive and will comply with all discussed for his goals and interventions mentioned above. Awareness of all future appointments as confirmed today.      THN-Track and Manage My Triggers-COPD (pt-stated)      Follow Up Date  03/05/2021 Timeframe:  Short-Term Goal Priority:  Medium Start Date:    06/21/2020  Expected End Date:    03/16/2021                   Barriers: Health Behaviors  - avoid second hand smoke - eliminate smoking in my home - identify and avoid work-related triggers - identify and remove indoor air pollutants - limit outdoor activity during cold weather - listen for public air quality announcements every day    Why is this important?   Triggers are activities or things, like tobacco smoke or cold weather, that make your COPD (chronic obstructive pulmonary disease) flare-up.  Knowing these triggers helps you plan how to stay away from them.  When you cannot remove them, you can learn how to manage them.     Notes:  9/20-Pt reports she remains in the GREEN zone with no residual symptoms experienced. Denies any additional congestions or thick mucus however pt is on 4 liters of home O2 now (MD aware). 8/17-Reports some ongoing congestion after completing antibiotics. Pt has cough medicine if needed. Pt continue to be in the GREEN zone however in/out of congestions with thick mucus that is white in coloration when present. Pt saturations in the 90's at rest and 70's with any exertion. Supportive son to assist and pt has life alert for any emergencies.   11/29/20 reports she continues to avoid triggers, track and manage her COPD symptoms well at home. Confirmed pulmonology office visit with concerns. Denies care coordination needs or disease management needs today appreciative of outreach 6/3-Pt continues to avoid triggers and prohibits smokers in her home that may cause an exacerbation episode. Pt mostly stays inside to avoid pollutants that may cause a flare-up. Pt remains on her 3 lts of home O2 24/7 as recommended. 4/26-Pt reports issues with her breathing at times however resolved with doses of Laxis permitted by her pulmonologist. Pt takes 2 pills (40 mg) for a few days and remains on her 3 liters of home O2. Denies any swelling today and however limited with her mobility she reports no additional SOB today. Pt aware she is able to wean to 1 dose of Laxis if needed and 4 liters due to the long tubing for full oxygenation however to check with her provider provider to long term usage of the 4 liters. Pt is in the GREEN zone today without symptoms. 3/22-Pt verifies she continues to do well with no acute symptoms since the last conversation and pt continue to be safe in her home.  Supportive assistance from her family when needed. Pt received the [facility] packet as RN encouraged pt to review the COPD tab for ongoing education and reiterated on the importance of managing this condition.  2/18-Reiterated on the same plan of care as pt remains on track with no acute symptoms as discussed. Review the plan of care for pt's knowledge base. 2/4-Discussed the plan of care once again and what to avoid that would risk flare ups and exacerbated episodes. Pt aware of several risk and continue to avoid.         This is a list of the screening recommended for you and due dates:  Health Maintenance  Topic Date Due   COVID-19 Vaccine (4 - Booster for Pfizer series) 06/18/2020   Flu Shot  12/16/2020   Tetanus Vaccine  11/26/2029   DEXA scan (bone density measurement)   Completed   Zoster (Shingles) Vaccine  Completed   HPV Vaccine  Aged Out

## 2021-02-10 ENCOUNTER — Other Ambulatory Visit: Payer: Self-pay | Admitting: Physician Assistant

## 2021-02-10 DIAGNOSIS — E038 Other specified hypothyroidism: Secondary | ICD-10-CM

## 2021-02-17 ENCOUNTER — Encounter: Payer: Self-pay | Admitting: Physician Assistant

## 2021-02-17 ENCOUNTER — Other Ambulatory Visit: Payer: Self-pay

## 2021-02-17 ENCOUNTER — Ambulatory Visit (INDEPENDENT_AMBULATORY_CARE_PROVIDER_SITE_OTHER): Payer: Medicare Other | Admitting: Physician Assistant

## 2021-02-17 VITALS — BP 110/58 | HR 62 | Temp 98.1°F | Ht 64.0 in | Wt 115.3 lb

## 2021-02-17 DIAGNOSIS — I10 Essential (primary) hypertension: Secondary | ICD-10-CM

## 2021-02-17 DIAGNOSIS — F341 Dysthymic disorder: Secondary | ICD-10-CM

## 2021-02-17 DIAGNOSIS — J441 Chronic obstructive pulmonary disease with (acute) exacerbation: Secondary | ICD-10-CM

## 2021-02-17 DIAGNOSIS — B351 Tinea unguium: Secondary | ICD-10-CM

## 2021-02-17 DIAGNOSIS — Z23 Encounter for immunization: Secondary | ICD-10-CM | POA: Diagnosis not present

## 2021-02-17 DIAGNOSIS — E038 Other specified hypothyroidism: Secondary | ICD-10-CM

## 2021-02-17 DIAGNOSIS — R600 Localized edema: Secondary | ICD-10-CM | POA: Diagnosis not present

## 2021-02-17 MED ORDER — BUPROPION HCL 75 MG PO TABS: ORAL_TABLET | ORAL | 0 refills | Status: DC

## 2021-02-17 NOTE — Assessment & Plan Note (Signed)
-  Followed by Pulmonology. -Reviewed most recent consult.

## 2021-02-17 NOTE — Assessment & Plan Note (Signed)
-  Stable. -Continue current medication regimen.  -Will continue to monitor.

## 2021-02-17 NOTE — Assessment & Plan Note (Addendum)
-  Last TSH wnl -Continue current medication regimen. -Rechecking TSH today. Pending results will make medication adjustments if indicated.

## 2021-02-17 NOTE — Progress Notes (Addendum)
Established Patient Office Visit  Subjective:  Patient ID: [name], female    DOB: 11/14/1929  Age: 87 y.o. MRN: [id number]  CC:  Chief Complaint  Patient presents with   Follow-up   Hypertension   Edema   COPD    HPI [name] presents for follow up on hypertension, edema and COPD. Patient states in the past was on Wellbutrin to help with smoking cessation and also helped with her mood. States would like to resume medication. Tolerated it ok without issues. Also reports tried topical anti-fungal solution which was ineffective and wants to try something else.  HTN: Pt denies chest pain, palpitations, or dizziness.  Reports lower extremity has been stable, has not needed to take water pill the last few weeks. Feels like torsemide worked better than the Lasix. Taking medication as directed without side effects. Has not checked BP at home recently. Continues to monitor fluid intake.  COPD: States has noticed an improvement with shortness of breath with activity since Dr. Lamonte Sakai advised she can increase oxygen to 4-5 L when needed.      Past Medical History:  Diagnosis Date   Allergic rhinitis    Aortic stenosis    Carotid artery disease (HCC)    Colon polyps    Congestive heart failure (CHF) ([place])    h/o Atrial fibrillation ([place]) 03/22/2011   Brief post op    Hematuria 11/05/2015   History of radiation therapy 05/02/20-05/15/20   SBRT to Right Lung - Dr. Sondra Come   Hypercalcemia    Hypertension    Hypothyroid    Lung cancer, lower lobe ([place]) dx'd 05/2020   xrt   Osteopenia    Oxygen dependent 11/05/2015   Pressure urticaria    RLS (restless legs syndrome)    S/P aortic valve replacement and aortoplasty 04/13/2011   67m Medtronic Freestyle porcine aortic root    S/P aortic valve replacement with stentless valve 03/18/2011    Past Surgical History:  Procedure Laterality Date   AORTIC ROOT REPLACEMENT  03/18/2011   21mMedtronic Freestyle Porcine aortic root  with reimplantation of coronary arteries   BREAST LUMPECTOMY     Left   CATARACT EXTRACTION     CHOLECYSTECTOMY     FOOT SURGERY     Morton's neuroma   KNEE ARTHROSCOPY     Left    Family History  Problem Relation Age of Onset   Cancer Mother        bladder   Lung cancer Father    Heart disease Maternal Grandmother    Emphysema Maternal Aunt    Asthma Maternal Aunt    Asthma Maternal Uncle    Breast cancer Sister    Uterine cancer Sister     Social History   Socioeconomic History   Marital status: Widowed    Spouse name: Not on file   Number of children: N   Years of education: Not on file   Highest education level: Not on file  Occupational History   Occupation: housewife  Tobacco Use   Smoking status: Former    Packs/day: 0.90    Years: 62.00    Pack years: 55.80    Types: Cigarettes    Quit date: 07/14/2010    Years since quitting: 10.6   Smokeless tobacco: Never  Vaping Use   Vaping Use: Never used  Substance and Sexual Activity   Alcohol use: No    Alcohol/week: 0.0 standard drinks   Drug use: No  Sexual activity: Never  Other Topics Concern   Not on file  Social History Narrative   Not on file   Social Determinants of Health   Financial Resource Strain: Not on file  Food Insecurity: No Food Insecurity   Worried About Running Out of Food in the Last Year: Never true   Ran Out of Food in the Last Year: Never true  Transportation Needs: No Transportation Needs   Lack of Transportation (Medical): No   Lack of Transportation (Non-Medical): No  Physical Activity: Not on file  Stress: Not on file  Social Connections: Not on file  Intimate Partner Violence: Not on file    Outpatient Medications Prior to Visit  Medication Sig Dispense Refill   acetaminophen (TYLENOL) 500 MG tablet Take 500 mg by mouth every 6 (six) hours as needed. Take 3 tablets every 6 hours     albuterol (PROAIR HFA) 108 (90 Base) MCG/ACT inhaler Inhale 2 puffs into the lungs  every 6 (six) hours as needed for wheezing or shortness of breath. 18 g 5   apixaban (ELIQUIS) 2.5 MG TABS tablet Take 1 tablet (2.5 mg total) by mouth 2 (two) times daily. 180 tablet 3   furosemide (LASIX) 20 MG tablet Take 1 tablet (20 mg total) by mouth every Monday, Wednesday, and Friday. 45 tablet 3   hydrOXYzine (ATARAX/VISTARIL) 10 MG tablet TAKE 1 TABLET BY MOUTH AT BEDTIME AS NEEDED 30 tablet 0   levothyroxine (SYNTHROID) 112 MCG tablet Take 1 tablet by mouth once daily 90 tablet 0   Melatonin 10 MG TABS Take 10 mg by mouth at bedtime.     metoprolol succinate (TOPROL-XL) 25 MG 24 hr tablet Take 1 tablet (25 mg total) by mouth daily. 90 tablet 3   OXYGEN Inhale 2-3 L/min into the lungs See admin instructions. Inhale 2 L/min into the lungs continuously and 3 L/min with any exertion     potassium chloride (KLOR-CON) 10 MEQ tablet Take 1 tablet (10 mEq total) by mouth every Monday, Wednesday, and Friday. 45 tablet 3   predniSONE (DELTASONE) 20 MG tablet Take 1 tablet (20 mg total) by mouth daily with breakfast. 30 tablet 5   simvastatin (ZOCOR) 20 MG tablet TAKE 1 TABLET BY MOUTH AT BEDTIME 90 tablet 0   STIOLTO RESPIMAT 2.5-2.5 MCG/ACT AERS INHALE 2 PUFFS BY MOUTH ONCE DAILY 12 g 4   amLODipine (NORVASC) 5 MG tablet Take 0.5 tablets (2.5 mg total) by mouth daily. 90 tablet 3   Cholecalciferol (VITAMIN D-3 PO) Take 1 capsule by mouth daily. (Patient not taking: Reported on 02/08/2021)     ciclopirox (PENLAC) 8 % solution Apply topically at bedtime. Apply over nail and surrounding skin. Apply daily over previous coat. After seven (7) days, may remove with alcohol and continue cycle. (Patient not taking: Reported on 02/08/2021) 6.6 mL 0   guaiFENesin-codeine (ROBITUSSIN AC) 100-10 MG/5ML syrup Take 5 mLs by mouth 2 (two) times daily as needed for cough. (Patient not taking: No sig reported) 120 mL 0   ipratropium-albuterol (DUONEB) 0.5-2.5 (3) MG/3ML SOLN Take 3 mLs by nebulization every 4 (four)  hours as needed. (Patient not taking: Reported on 02/08/2021) 150 mL 3   Omega-3 Fatty Acids (FISH OIL) 1000 MG CAPS Take 1,000 mg by mouth daily. (Patient not taking: Reported on 02/08/2021)     torsemide (DEMADEX) 10 MG tablet Take 1 tablet (10 mg total) by mouth daily as needed. 30 tablet 0   No facility-administered medications prior to visit.  Allergies  Allergen Reactions   Clarithromycin Other (See Comments)    Made the mouth/throat sore   Tamsulosin Itching   Trazodone And Nefazodone Other (See Comments)    Dizziness    Penicillins Itching and Rash    Did it involve swelling of the face/tongue/throat, SOB, or low BP? No Did it involve sudden or severe rash/hives, skin peeling, or any reaction on the inside of your mouth or nose? Yes Did you need to seek medical attention at a hospital or doctor's office? Yes When did it last happen?      Young woman  If all above answers are "NO", may proceed with cephalosporin use.     ROS Review of Systems Review of Systems:  A fourteen system review of systems was performed and found to be positive as per HPI.   Objective:    Physical Exam General:  Well Developed, well nourished, appropriate for stated age.  Neuro:  Alert and oriented,  extra-ocular muscles intact  HEENT:  Normocephalic, atraumatic, neck supple Skin:  no gross rash, warm, pink. Cardiac:  RRR, S1 S2 Respiratory:  Dec breath sounds, Not using accessory muscles, speaking in full sentences- unlabored. Vascular:  Ext warm, no cyanosis apprec.; cap RF less 2 sec. No edema. Psych:  No HI/SI, judgement and insight good, Euthymic mood. Full Affect.  BP (!) 110/58   Pulse 62   Temp 98.1 F (36.7 C)   Ht _0  (1.626 m)   Wt 115 lb 4.8 oz (52.3 kg)   SpO2 96%   BMI 19.79 kg/m  Wt Readings from Last 3 Encounters:  02/17/21 115 lb 4.8 oz (52.3 kg)  01/23/21 113 lb 12.8 oz (51.6 kg)  12/24/20 115 lb (52.2 kg)     Health Maintenance Due  Topic Date Due    COVID-19 Vaccine (5 - Booster for Pfizer series) 01/31/2021    There are no preventive care reminders to display for this patient.  Lab Results  Component Value Date   TSH 0.984 04/24/2020   Lab Results  Component Value Date   WBC 9.6 06/28/2020   HGB 15.9 12/09/2020   HCT 43.6 06/28/2020   MCV 102 (H) 06/28/2020   PLT 165 06/28/2020   Lab Results  Component Value Date   NA 145 (H) 12/09/2020   K 4.3 12/09/2020   CO2 32 (H) 12/09/2020   GLUCOSE 101 (H) 12/09/2020   BUN 26 12/09/2020   CREATININE 1.53 (H) 12/09/2020   BILITOT 0.3 06/27/2020   ALKPHOS 52 06/27/2020   AST 19 06/27/2020   ALT 28 06/27/2020   PROT 6.1 06/27/2020   ALBUMIN 4.5 12/09/2020   CALCIUM 9.8 12/09/2020   ANIONGAP 8 06/15/2020   EGFR 33 (L) 12/09/2020   Lab Results  Component Value Date   CHOL 151 03/06/2019   Lab Results  Component Value Date   HDL 81 03/06/2019   Lab Results  Component Value Date   LDLCALC 54 03/06/2019   Lab Results  Component Value Date   TRIG 85 03/06/2019   Lab Results  Component Value Date   CHOLHDL 1.9 03/06/2019   Lab Results  Component Value Date   HGBA1C 5.5 03/06/2019      Assessment & Plan:   Problem List Items Addressed This Visit       Cardiovascular and Mediastinum   Essential hypertension    -Stable. -Continue current medication regimen.  -Will continue to monitor.      Relevant Orders   CBC w/Diff  Respiratory   COPD (chronic obstructive pulmonary disease) ([place]) - Primary    -Followed by Pulmonology. -Reviewed most recent consult.        Endocrine   Hypothyroidism; on synthroid (Chronic)    -Last TSH wnl -Continue current medication regimen. -Rechecking TSH today. Pending results will make medication adjustments if indicated.       Relevant Orders   TSH     Other   Dysthymia    -PHQ-9 score of 9, denies SI/HI. Will start medication therapy with Wellbutrin. Advised to let me know if unable to tolerate  medication. -Will continue to monitor.        Relevant Medications   buPROPion (WELLBUTRIN) 75 MG tablet   Other Visit Diagnoses     Need for influenza vaccination       Relevant Orders   Flu Vaccine QUAD High Dose(Fluad) (Completed)   Lower extremity edema       Relevant Orders   Comp Met (CMET)   Onychomycosis          Lower extremity edema: -Stable. No edema on exam today. -Recommend to continue diuretic therapy as needed. On intake patient reported no longer taking torsemide. Recommend verifying which medication patient is taking, should be taking one diuretic (furosemide or torsemide). -Will collect CMP to monitor renal function and electrolytes.  Onychomycosis: -Discussed with patient alternative treatment options and wants to trial oral medication. Will collect CMP to evaluate renal and hepatic function. Pending lab results will send rx for terbinafine for 12 weeks.  Addendum 02/18/2021: Patient's granddaughter- Tonya (on [place]) verified patient is taking torsemide 10 mg when needed for edema, no longer taking Lasix. Will update medication list.  Meds ordered this encounter  Medications   buPROPion (WELLBUTRIN) 75 MG tablet    Sig: Take 1 tablet by mouth once daily x 1 week. Take 1 tablet by mouth twice daily.    Dispense:  180 tablet    Refill:  0    Order Specific Question:   Supervising Provider    Answer:   Beatrice Lecher D [[id number]]    Follow-up: Return in about 3 months (around 05/20/2021) for HTN, CKD, mood- restarted med.    [name], PA-C

## 2021-02-17 NOTE — Patient Instructions (Signed)

## 2021-02-17 NOTE — Assessment & Plan Note (Signed)
-  PHQ-9 score of 9, denies SI/HI. Will start medication therapy with Wellbutrin. Advised to let me know if unable to tolerate medication. -Will continue to monitor.

## 2021-02-18 LAB — COMPREHENSIVE METABOLIC PANEL
ALT: 21 IU/L (ref 0–32)
AST: 24 IU/L (ref 0–40)
Albumin/Globulin Ratio: 1.9 (ref 1.2–2.2)
Albumin: 4.2 g/dL (ref 3.6–4.6)
Alkaline Phosphatase: 51 IU/L (ref 44–121)
BUN/Creatinine Ratio: 19 (ref 12–28)
BUN: 27 mg/dL (ref 8–27)
Bilirubin Total: 0.5 mg/dL (ref 0.0–1.2)
CO2: 29 mmol/L (ref 20–29)
Calcium: 9.7 mg/dL (ref 8.7–10.3)
Chloride: 96 mmol/L (ref 96–106)
Creatinine, Ser: 1.41 mg/dL — ABNORMAL HIGH (ref 0.57–1.00)
Globulin, Total: 2.2 g/dL (ref 1.5–4.5)
Glucose: 94 mg/dL (ref 70–99)
Potassium: 4.8 mmol/L (ref 3.5–5.2)
Sodium: 142 mmol/L (ref 134–144)
Total Protein: 6.4 g/dL (ref 6.0–8.5)
eGFR: 36 mL/min/{1.73_m2} — ABNORMAL LOW (ref >59)

## 2021-02-18 LAB — CBC WITH DIFFERENTIAL/PLATELET
Basophils Absolute: 0 10*3/uL (ref 0.0–0.2)
Basos: 0 %
EOS (ABSOLUTE): 0.1 10*3/uL (ref 0.0–0.4)
Eos: 1 %
Hematocrit: 47.4 % — ABNORMAL HIGH (ref 34.0–46.6)
Hemoglobin: 16.2 g/dL — ABNORMAL HIGH (ref 11.1–15.9)
Immature Grans (Abs): 0.1 10*3/uL (ref 0.0–0.1)
Immature Granulocytes: 1 %
Lymphocytes Absolute: 0.9 10*3/uL (ref 0.7–3.1)
Lymphs: 7 %
MCH: 32.7 pg (ref 26.6–33.0)
MCHC: 34.2 g/dL (ref 31.5–35.7)
MCV: 96 fL (ref 79–97)
Monocytes Absolute: 0.6 10*3/uL (ref 0.1–0.9)
Monocytes: 5 %
Neutrophils Absolute: 10.5 10*3/uL — ABNORMAL HIGH (ref 1.4–7.0)
Neutrophils: 86 %
Platelets: 160 10*3/uL (ref 150–450)
RBC: 4.95 x10E6/uL (ref 3.77–5.28)
RDW: 11.4 % — ABNORMAL LOW (ref 11.7–15.4)
WBC: 12.3 10*3/uL — ABNORMAL HIGH (ref 3.4–10.8)

## 2021-02-18 LAB — TSH: TSH: 3.51 u[IU]/mL (ref 0.450–4.500)

## 2021-02-18 MED ORDER — TORSEMIDE 10 MG PO TABS: 10.0000 mg | ORAL_TABLET | Freq: Every day | ORAL | 0 refills | Status: DC | PRN

## 2021-02-18 NOTE — Addendum Note (Signed)
Addended by: [name] on: 02/18/2021 08:20 AM   Modules accepted: Orders

## 2021-02-19 ENCOUNTER — Other Ambulatory Visit: Payer: Self-pay | Admitting: Physician Assistant

## 2021-02-19 DIAGNOSIS — B351 Tinea unguium: Secondary | ICD-10-CM

## 2021-02-19 MED ORDER — TERBINAFINE HCL 250 MG PO TABS: 125.0000 mg | ORAL_TABLET | Freq: Every day | ORAL | 0 refills | Status: DC

## 2021-03-05 ENCOUNTER — Other Ambulatory Visit: Payer: Self-pay | Admitting: *Deleted

## 2021-03-05 NOTE — Patient Outreach (Signed)
[facility]) Care Management  03/05/2021  [name] Sep 30, 1929 [id number]   Telephone Assessment-Unsuccessful  RN attempted outreach call  today however unsuccessful. RN able to leave a HIPAA approved voice message requesting a call back.  Will attempt another outreach call over the next week for ongoing [facility] services.  Raina Mina, RN Care Management Coordinator [place] Office [phone]

## 2021-03-11 ENCOUNTER — Telehealth: Payer: Self-pay | Admitting: *Deleted

## 2021-03-11 ENCOUNTER — Other Ambulatory Visit: Payer: Self-pay | Admitting: *Deleted

## 2021-03-11 NOTE — Telephone Encounter (Signed)
CALLED PATIENT TO INFORM OF CT FOR 03-20-21- ARRIVAL TIME- 12:45 PM @ [facility], NO RESTRICTIONS TO TEST, PATIENT TO FU WITH DR. KINARD FOR RESULTS ON 03-24-21 @ 3 PM FOR RESULTS SPOKE WITH PATIENT AND SHE IS AWARE OF THESE APPTS.

## 2021-03-11 NOTE — Patient Outreach (Signed)
[facility]) Care Management  03/11/2021  ZI SEK December 29, 1929 [id number]   Telephone Assessment-Successful-COPD Referral to a Health Coach  Spoke with pt today and discussed the currently plan of care with noted updates within the plan. No acute issues or problems at this time as pt continue to do well and adherent to the plan of care.   Based upon pt's self management of care will refer to a Health Coach for quarterly updates. Will also update pt's provider on pt's disposition with [facility] services.   Goals Addressed               This Visit's Progress     THN-Track and Manage My Symptoms-COPD (pt-stated)   On track     Follow Up Date 06/17/2021 Timeframe:  Long-Range Goal Priority:  Medium Start Date:     06/21/2020                        Expected End Date:    06/17/2021                 Barriers: Health Behaviors  - begin a symptom diary - bring symptom diary to all visits - develop a rescue plan - eliminate symptom triggers at home - follow rescue plan if symptoms flare-up - keep follow-up appointments - use an extra pillow to sleep    Why is this important?   Tracking your symptoms and other information about your health helps your doctor plan your care.  Write down the symptoms, the time of day, what you were doing and what medicine you are taking.  You will soon learn how to manage your symptoms.     Notes:  10/25- Pt remains asymptomatic with no acute symptoms and uses her preventive medications as prescribed. Pt is in the GREEN zone and continue to use her home O2 on 4 liters as provider is aware. Will continue verify pt is avoiding the risk of flare ups and limiting her surroundings with sick family or friends at this time. 11/29/20 reports she continues to track and manage her COPD symptoms well at home Confirmed pulmonology office visit with concerns. Denies care coordination needs or disease management needs today appreciative of  outreach 6/3-Pt doing well with no acute flare-ups and continue to take all her medication and uses her home O2 24/7 with no incidents over the last month. Will continue encouraged adherence to the discussed plan of care. 2/4-Developed action plan for long term goals as pt receptive and will comply with all discussed for his goals and interventions mentioned above. Awareness of all future appointments as confirmed today.      THN-Track and Manage My Triggers-COPD (pt-stated)   On track     Follow Up Date  06/17/2021 Timeframe:  Short-Term Goal Priority:  Medium Start Date:    06/21/2020                         Expected End Date:    06/17/2021                   Barriers: Health Behaviors  - avoid second hand smoke - eliminate smoking in my home - identify and avoid work-related triggers - identify and remove indoor air pollutants - limit outdoor activity during cold weather - listen for public air quality announcements every day    Why is this important?   Triggers are activities or  things, like tobacco smoke or cold weather, that make your COPD (chronic obstructive pulmonary disease) flare-up.  Knowing these triggers helps you plan how to stay away from them.  When you cannot remove them, you can learn how to manage them.     Notes:  10/25-Will continue to reiterate on avoid environmental factors that would result in an exacerbated episode of COPD. Will continue to encourage use of her inhalers with wheezing and difficulty breathing. Verified pt is currently in the GREEN zone with no acute symptoms today. 9/20-Pt reports she remains in the GREEN zone with no residual symptoms experienced. Denies any additional congestions or thick mucus however pt is on 4 liters of home O2 now (MD aware). 8/17-Reports some ongoing congestion after completing antibiotics. Pt has cough medicine if needed. Pt continue to be in the GREEN zone however in/out of congestions with thick mucus that is white in  coloration when present. Pt saturations in the 90's at rest and 70's with any exertion. Supportive son to assist and pt has life alert for any emergencies.  11/29/20 reports she continues to avoid triggers, track and manage her COPD symptoms well at home. Confirmed pulmonology office visit with concerns. Denies care coordination needs or disease management needs today appreciative of outreach 6/3-Pt continues to avoid triggers and prohibits smokers in her home that may cause an exacerbation episode. Pt mostly stays inside to avoid pollutants that may cause a flare-up. Pt remains on her 3 lts of home O2 24/7 as recommended. 4/26-Pt reports issues with her breathing at times however resolved with doses of Laxis permitted by her pulmonologist. Pt takes 2 pills (40 mg) for a few days and remains on her 3 liters of home O2. Denies any swelling today and however limited with her mobility she reports no additional SOB today. Pt aware she is able to wean to 1 dose of Laxis if needed and 4 liters due to the long tubing for full oxygenation however to check with her provider provider to long term usage of the 4 liters. Pt is in the GREEN zone today without symptoms. 3/22-Pt verifies she continues to do well with no acute symptoms since the last conversation and pt continue to be safe in her home.  Supportive assistance from her family when needed. Pt received the [facility] packet as RN encouraged pt to review the COPD tab for ongoing education and reiterated on the importance of managing this condition.  2/18-Reiterated on the same plan of care as pt remains on track with no acute symptoms as discussed. Review the plan of care for pt's knowledge base. 2/4-Discussed the plan of care once again and what to avoid that would risk flare ups and exacerbated episodes. Pt aware of several risk and continue to avoid.          Raina Mina, RN Care Management Coordinator [place] Office [phone]

## 2021-03-12 ENCOUNTER — Other Ambulatory Visit: Payer: Self-pay | Admitting: *Deleted

## 2021-03-16 ENCOUNTER — Emergency Department (HOSPITAL_BASED_OUTPATIENT_CLINIC_OR_DEPARTMENT_OTHER): Payer: Medicare Other

## 2021-03-16 ENCOUNTER — Emergency Department (HOSPITAL_BASED_OUTPATIENT_CLINIC_OR_DEPARTMENT_OTHER)
Admission: EM | Admit: 2021-03-16 | Discharge: 2021-03-16 | Disposition: A | Discharge status: Home / Self Care | Payer: Medicare Other | Attending: Emergency Medicine | Admitting: Emergency Medicine

## 2021-03-16 ENCOUNTER — Encounter (HOSPITAL_BASED_OUTPATIENT_CLINIC_OR_DEPARTMENT_OTHER): Payer: Self-pay | Admitting: Emergency Medicine

## 2021-03-16 DIAGNOSIS — Z7901 Long term (current) use of anticoagulants: Secondary | ICD-10-CM | POA: Diagnosis not present

## 2021-03-16 DIAGNOSIS — R0602 Shortness of breath: Secondary | ICD-10-CM | POA: Diagnosis not present

## 2021-03-16 DIAGNOSIS — I130 Hypertensive heart and chronic kidney disease with heart failure and stage 1 through stage 4 chronic kidney disease, or unspecified chronic kidney disease: Secondary | ICD-10-CM | POA: Insufficient documentation

## 2021-03-16 DIAGNOSIS — Z79899 Other long term (current) drug therapy: Secondary | ICD-10-CM | POA: Insufficient documentation

## 2021-03-16 DIAGNOSIS — N1832 Chronic kidney disease, stage 3b: Secondary | ICD-10-CM | POA: Diagnosis not present

## 2021-03-16 DIAGNOSIS — I10 Essential (primary) hypertension: Secondary | ICD-10-CM | POA: Diagnosis not present

## 2021-03-16 DIAGNOSIS — I4891 Unspecified atrial fibrillation: Secondary | ICD-10-CM | POA: Diagnosis not present

## 2021-03-16 DIAGNOSIS — R5383 Other fatigue: Secondary | ICD-10-CM | POA: Diagnosis not present

## 2021-03-16 DIAGNOSIS — Z85118 Personal history of other malignant neoplasm of bronchus and lung: Secondary | ICD-10-CM | POA: Insufficient documentation

## 2021-03-16 DIAGNOSIS — E039 Hypothyroidism, unspecified: Secondary | ICD-10-CM | POA: Diagnosis not present

## 2021-03-16 DIAGNOSIS — Z87891 Personal history of nicotine dependence: Secondary | ICD-10-CM | POA: Insufficient documentation

## 2021-03-16 DIAGNOSIS — R42 Dizziness and giddiness: Secondary | ICD-10-CM | POA: Diagnosis not present

## 2021-03-16 DIAGNOSIS — N9489 Other specified conditions associated with female genital organs and menstrual cycle: Secondary | ICD-10-CM | POA: Insufficient documentation

## 2021-03-16 DIAGNOSIS — Z7951 Long term (current) use of inhaled steroids: Secondary | ICD-10-CM | POA: Diagnosis not present

## 2021-03-16 DIAGNOSIS — I5033 Acute on chronic diastolic (congestive) heart failure: Secondary | ICD-10-CM | POA: Diagnosis not present

## 2021-03-16 DIAGNOSIS — I2510 Atherosclerotic heart disease of native coronary artery without angina pectoris: Secondary | ICD-10-CM | POA: Insufficient documentation

## 2021-03-16 DIAGNOSIS — H538 Other visual disturbances: Secondary | ICD-10-CM | POA: Diagnosis not present

## 2021-03-16 DIAGNOSIS — I517 Cardiomegaly: Secondary | ICD-10-CM | POA: Diagnosis not present

## 2021-03-16 DIAGNOSIS — J449 Chronic obstructive pulmonary disease, unspecified: Secondary | ICD-10-CM | POA: Insufficient documentation

## 2021-03-16 LAB — CBC WITH DIFFERENTIAL/PLATELET
Abs Immature Granulocytes: 0.04 10*3/uL (ref 0.00–0.07)
Basophils Absolute: 0 10*3/uL (ref 0.0–0.1)
Basophils Relative: 0 %
Eosinophils Absolute: 0 10*3/uL (ref 0.0–0.5)
Eosinophils Relative: 0 %
HCT: 47.3 % — ABNORMAL HIGH (ref 36.0–46.0)
Hemoglobin: 15.4 g/dL — ABNORMAL HIGH (ref 12.0–15.0)
Immature Granulocytes: 1 %
Lymphocytes Relative: 10 %
Lymphs Abs: 0.6 10*3/uL — ABNORMAL LOW (ref 0.7–4.0)
MCH: 33 pg (ref 26.0–34.0)
MCHC: 32.6 g/dL (ref 30.0–36.0)
MCV: 101.3 fL — ABNORMAL HIGH (ref 80.0–100.0)
Monocytes Absolute: 0.2 10*3/uL (ref 0.1–1.0)
Monocytes Relative: 3 %
Neutro Abs: 5.5 10*3/uL (ref 1.7–7.7)
Neutrophils Relative %: 86 %
Platelets: 140 10*3/uL — ABNORMAL LOW (ref 150–400)
RBC: 4.67 MIL/uL (ref 3.87–5.11)
RDW: 13.2 % (ref 11.5–15.5)
WBC: 6.4 10*3/uL (ref 4.0–10.5)
nRBC: 0 % (ref 0.0–0.2)

## 2021-03-16 LAB — I-STAT VENOUS BLOOD GAS, ED
Acid-Base Excess: 8 mmol/L — ABNORMAL HIGH (ref 0.0–2.0)
Bicarbonate: 37.2 mmol/L — ABNORMAL HIGH (ref 20.0–28.0)
Calcium, Ion: 1.39 mmol/L (ref 1.15–1.40)
HCT: 47 % — ABNORMAL HIGH (ref 36.0–46.0)
Hemoglobin: 16 g/dL — ABNORMAL HIGH (ref 12.0–15.0)
O2 Saturation: 64 %
Potassium: 4.8 mmol/L (ref 3.5–5.1)
Sodium: 138 mmol/L (ref 135–145)
TCO2: 39 mmol/L — ABNORMAL HIGH (ref 22–32)
pCO2, Ven: 71.5 mmHg (ref 44.0–60.0)
pH, Ven: 7.325 (ref 7.250–7.430)
pO2, Ven: 37 mmHg (ref 32.0–45.0)

## 2021-03-16 LAB — URINALYSIS, ROUTINE W REFLEX MICROSCOPIC
Bilirubin Urine: NEGATIVE
Glucose, UA: NEGATIVE mg/dL
Hgb urine dipstick: NEGATIVE
Ketones, ur: NEGATIVE mg/dL
Leukocytes,Ua: NEGATIVE
Nitrite: NEGATIVE
Protein, ur: NEGATIVE mg/dL
Specific Gravity, Urine: 1.02 (ref 1.005–1.030)
pH: 7 (ref 5.0–8.0)

## 2021-03-16 LAB — CBG MONITORING, ED: Glucose-Capillary: 124 mg/dL — ABNORMAL HIGH (ref 70–99)

## 2021-03-16 LAB — BASIC METABOLIC PANEL
Anion gap: 7 (ref 5–15)
BUN: 27 mg/dL — ABNORMAL HIGH (ref 8–23)
CO2: 32 mmol/L (ref 22–32)
Calcium: 9.9 mg/dL (ref 8.9–10.3)
Chloride: 97 mmol/L — ABNORMAL LOW (ref 98–111)
Creatinine, Ser: 1.37 mg/dL — ABNORMAL HIGH (ref 0.44–1.00)
GFR, Estimated: 37 mL/min — ABNORMAL LOW (ref >60)
Glucose, Bld: 134 mg/dL — ABNORMAL HIGH (ref 70–99)
Potassium: 4.9 mmol/L (ref 3.5–5.1)
Sodium: 136 mmol/L (ref 135–145)

## 2021-03-16 NOTE — Discharge Instructions (Signed)
Recommend following up with your primary doctor and your pulmonologist.  Come back to ER if you develop chest pain, difficulty in breathing, or other new concerning symptom.

## 2021-03-16 NOTE — ED Triage Notes (Signed)
Pt's granddaughter reports pt "can't stay awake", blurred vision and lightheadedness since yesterday morning; sts similar sxs when she was admitted for elevated CO2 in Feb

## 2021-03-17 NOTE — ED Provider Notes (Signed)
[facility] Provider Note   CSN: [id number] Arrival date & time: 03/16/21  1534     History Chief Complaint  Patient presents with   Blurred Vision   Altered Mental Status    [name] is a 87 y.o. female.  Presents to ER with concern for multiple complaints.  Daughter reports that since yesterday patient seemed to be more lethargic, fatigued, also had complained of feeling somewhat lightheaded and had episode where she had difficulty with vision.  Patient states that she had difficulty focusing but did not have any loss of her vision.  Patient denies any medical complaints at present.  Specifically she denies feeling lethargic or lightheaded at present.  No LOC.  No chest pain.  Extensive past medical history including history of aortic stenosis, coronary artery disease, heart failure, lung cancer, hypertension, chronic respiratory failure on 3L [place] at baseline, up to 4-5 with exertion.  HPI     Past Medical History:  Diagnosis Date   Allergic rhinitis    Aortic stenosis    Carotid artery disease (HCC)    Colon polyps    Congestive heart failure (CHF) ([place])    h/o Atrial fibrillation ([place]) 03/22/2011   Brief post op    Hematuria 11/05/2015   History of radiation therapy 05/02/20-05/15/20   SBRT to Right Lung - Dr. Sondra Come   Hypercalcemia    Hypertension    Hypothyroid    Lung cancer, lower lobe ([place]) dx'd 05/2020   xrt   Osteopenia    Oxygen dependent 11/05/2015   Pressure urticaria    RLS (restless legs syndrome)    S/P aortic valve replacement and aortoplasty 04/13/2011   49m Medtronic Freestyle porcine aortic root    S/P aortic valve replacement with stentless valve 03/18/2011    Patient Active Problem List   Diagnosis Date Noted   Acute non-recurrent pansinusitis 12/24/2020   Cough 12/24/2020   Acute on chronic respiratory failure ([place] 06/15/2020   Pulmonary hypertension, unspecified ([place] 06/15/2020   AKI (acute kidney  injury) ([facility] 06/15/2020   Lung cancer ([facility] 06/15/2020   Chronic anticoagulation 06/15/2020   Acute on chronic diastolic CHF (congestive heart failure) ([place] 06/13/2020   Atrial fibrillation with RVR ([facility] 06/10/2020   Atrial flutter with rapid ventricular response (HCC)    High output congestive heart failure ([facility]    Atrial flutter ([place] 06/09/2020   Pulmonary nodule 1 cm or greater in diameter 07/24/2019   Nonintractable headache 07/04/2019   Suppurative otitis media of right ear 07/04/2019   Hospital discharge follow-up 07/04/2019   Hydronephrosis 06/26/2019   Chest pain in adult 06/25/2019   Hypertensive urgency    History of aortic valve replacement    Hyperlipidemia LDL goal <70    New daily persistent headache-  now resolved 02/28/2019   Stage 3b chronic kidney disease ([facility] 02/28/2019   Prediabetes 02/28/2019   History of iron deficiency anemia 02/28/2019   Urinary frequency 02/28/2019   Dysthymia 02/28/2019   Unintentional weight loss 02/09/2019   Chronic respiratory failure with hypoxia ([facility] [place] 02/09/2019   Insomnia 04/28/2018   Iron deficiency anemia 04/28/2018   Estrogen deficiency 04/28/2018   H. pylori infection w/ GERD sx 10/19/2017   Essential hypertension 10/19/2017   Medication monitoring encounter 07/07/2017   Family history of breast cancer in sister 07/07/2017   Family history of bladder cancer-in mother 07/07/2017   Osteopenia after menopause 07/07/2017   Chest pain 07/07/2017   History of smoking   (  39 yrs at 0.5ppd) ;1991- until 2012 ( 8 cig/day )   02/09/2017   Dyspnea 03/11/2016   Elevated hemoglobin A1c 11/13/2015   Hematuria 11/05/2015   Hypothyroidism; on synthroid 11/05/2015   Osteopenia 11/05/2015   h/o Hypercalcemia 11/05/2015   h/o RLS (restless legs syndrome) 11/05/2015   h/o Intention tremor  11/05/2015   History of nephrolithiasis 11/05/2015   S/P aortic valve replacement and aortoplasty 04/13/2011   h/o Atrial fibrillation ([place])  03/22/2011   S/P aortic valve replacement with stentless valve 03/18/2011   Carotid artery disease ([place])    Hypertension    COPD (chronic obstructive pulmonary disease) ([place]) 03/02/2011    Past Surgical History:  Procedure Laterality Date   AORTIC ROOT REPLACEMENT  03/18/2011   16m Medtronic Freestyle Porcine aortic root with reimplantation of coronary arteries   BREAST LUMPECTOMY     Left   CATARACT EXTRACTION     CHOLECYSTECTOMY     FOOT SURGERY     Morton's neuroma   KNEE ARTHROSCOPY     Left     OB History   No obstetric history on file.     Family History  Problem Relation Age of Onset   Cancer Mother        bladder   Lung cancer Father    Heart disease Maternal Grandmother    Emphysema Maternal Aunt    Asthma Maternal Aunt    Asthma Maternal Uncle    Breast cancer Sister    Uterine cancer Sister     Social History   Tobacco Use   Smoking status: Former    Packs/day: 0.90    Years: 62.00    Pack years: 55.80    Types: Cigarettes    Quit date: 07/14/2010    Years since quitting: 10.6   Smokeless tobacco: Never  Vaping Use   Vaping Use: Never used  Substance Use Topics   Alcohol use: No    Alcohol/week: 0.0 standard drinks   Drug use: No    Home Medications Prior to Admission medications   Medication Sig Start Date End Date Taking? Authorizing Provider  terbinafine (LAMISIL) 250 MG tablet Take 0.5 tablets (125 mg total) by mouth daily. Patient not taking: Reported on 03/11/2021 02/19/21   ALorrene Reid PA-C  acetaminophen (TYLENOL) 500 MG tablet Take 500 mg by mouth every 6 (six) hours as needed. Take 3 tablets every 6 hours    [provider]  albuterol (PROAIR HFA) 108 (90 Base) MCG/ACT inhaler Inhale 2 puffs into the lungs every 6 (six) hours as needed for wheezing or shortness of breath. 10/23/20   BCollene Gobble MD  amLODipine (NORVASC) 5 MG tablet Take 0.5 tablets (2.5 mg total) by mouth daily. 07/17/20 02/08/21  LImogene Burn PA-C   apixaban (ELIQUIS) 2.5 MG TABS tablet Take 1 tablet (2.5 mg total) by mouth 2 (two) times daily. 06/15/20   DLedora Bottcher PA  buPROPion (WELLBUTRIN) 75 MG tablet Take 1 tablet by mouth once daily x 1 week. Take 1 tablet by mouth twice daily. 02/17/21   ALorrene Reid PA-C  hydrOXYzine (ATARAX/VISTARIL) 10 MG tablet TAKE 1 TABLET BY MOUTH AT BEDTIME AS NEEDED Patient not taking: Reported on 03/11/2021 12/10/20   ALorrene Reid PA-C  levothyroxine (SYNTHROID) 112 MCG tablet Take 1 tablet by mouth once daily 02/11/21   ALorrene Reid PA-C  Melatonin 10 MG TABS Take 10 mg by mouth at bedtime.    [provider]  metoprolol succinate (TOPROL-XL) 25  MG 24 hr tablet Take 1 tablet (25 mg total) by mouth daily. 06/16/20   Duke, Tami Lin, PA  OXYGEN Inhale 4 L/min into the lungs See admin instructions. Inhale 2 L/min into the lungs continuously and 3 L/min with any exertion    [provider]  potassium chloride (KLOR-CON) 10 MEQ tablet Take 1 tablet (10 mEq total) by mouth every Monday, Wednesday, and Friday. 10/04/20   Belva Crome, MD  predniSONE (DELTASONE) 20 MG tablet Take 1 tablet (20 mg total) by mouth daily with breakfast. 10/23/20   Collene Gobble, MD  simvastatin (ZOCOR) 20 MG tablet TAKE 1 TABLET BY MOUTH AT BEDTIME 01/02/21   Abonza, Maritza, PA-C  STIOLTO RESPIMAT 2.5-2.5 MCG/ACT AERS INHALE 2 PUFFS BY MOUTH ONCE DAILY 01/16/21   Collene Gobble, MD  torsemide (DEMADEX) 10 MG tablet Take 1 tablet (10 mg total) by mouth daily as needed (edema). 02/18/21   [name], PA-C    Allergies    Clarithromycin, Tamsulosin, Trazodone and nefazodone, and Penicillins  Review of Systems   Review of Systems  Constitutional:  Positive for fatigue. Negative for chills and fever.  HENT:  Negative for ear pain and sore throat.   Eyes:  Positive for visual disturbance. Negative for pain.  Respiratory:  Negative for cough and shortness of breath.   Cardiovascular:  Negative  for chest pain and palpitations.  Gastrointestinal:  Negative for abdominal pain and vomiting.  Genitourinary:  Negative for dysuria and hematuria.  Musculoskeletal:  Negative for arthralgias and back pain.  Skin:  Negative for color change and rash.  Neurological:  Positive for light-headedness. Negative for seizures and syncope.  All other systems reviewed and are negative.  Physical Exam Updated Vital Signs BP (!) 143/91   Pulse 61   Temp 98.7 F (37.1 C) (Oral)   Resp 18   Ht _0  (1.626 m)   Wt 52.2 kg   SpO2 90%   BMI 19.74 kg/m   Physical Exam Vitals and nursing note reviewed.  Constitutional:      General: She is not in acute distress.    Appearance: She is well-developed.  HENT:     Head: Normocephalic and atraumatic.  Eyes:     Conjunctiva/sclera: Conjunctivae normal.  Cardiovascular:     Rate and Rhythm: Normal rate and regular rhythm.     Heart sounds: No murmur heard. Pulmonary:     Effort: Pulmonary effort is normal. No respiratory distress.     Breath sounds: Normal breath sounds.  Abdominal:     Palpations: Abdomen is soft.     Tenderness: There is no abdominal tenderness.  Musculoskeletal:     Cervical back: Neck supple.  Skin:    General: Skin is warm and dry.  Neurological:     Mental Status: She is alert.     Comments: AAOx3 CN 2-12 intact, speech clear visual fields intact 5/5 strength in b/l UE and LE Sensation to light touch intact in b/l UE and LE Normal FNF Normal gait  Psychiatric:        Mood and Affect: Mood normal.        Thought Content: Thought content normal.    ED Results / Procedures / Treatments   Labs (all labs ordered are listed, but only abnormal results are displayed) Labs Reviewed  CBC WITH DIFFERENTIAL/PLATELET - Abnormal; Notable for the following components:      Result Value   Hemoglobin 15.4 (*)    HCT 47.3 (*)  MCV 101.3 (*)    Platelets 140 (*)    Lymphs Abs 0.6 (*)    All other components within  normal limits  BASIC METABOLIC PANEL - Abnormal; Notable for the following components:   Chloride 97 (*)    Glucose, Bld 134 (*)    BUN 27 (*)    Creatinine, Ser 1.37 (*)    GFR, Estimated 37 (*)    All other components within normal limits  CBG MONITORING, ED - Abnormal; Notable for the following components:   Glucose-Capillary 124 (*)    All other components within normal limits  I-STAT VENOUS BLOOD GAS, ED - Abnormal; Notable for the following components:   pCO2, Ven 71.5 (*)    Bicarbonate 37.2 (*)    TCO2 39 (*)    Acid-Base Excess 8.0 (*)    HCT 47.0 (*)    Hemoglobin 16.0 (*)    All other components within normal limits  URINALYSIS, ROUTINE W REFLEX MICROSCOPIC    EKG EKG Interpretation  Date/Time:  _39  year old who presented to ER with concern for lethargy, lightheadedness, vision change.  On  physical exam, patient looks remarkably well-appearing.  She does not have any ongoing medical complaints at present.  Checked basic laboratory work-up, chest x-ray, EKG and VBG.  Normal neurologic exam.  Basic labs grossly stable.  VBG noted for hypercapnia but pH is normal.  Suspect patient is chronic retainer.  Given she has no respiratory complaints and looks well and has no ongoing lethargy, difficulty breathing, feel that she can be discharged and managed in the outpatient setting.  Recommend she follow-up with her pulmonologist and her primary care doctor.  Reviewed return precautions and discharged home with daughter.  After the discussed management above, the patient was determined to be safe for discharge.  The patient was in agreement with this plan and all questions regarding their care were answered.  ED return precautions were discussed and the patient will return to the ED with any significant worsening of condition.   Final Clinical Impression(s) / ED Diagnoses Final diagnoses:  Other fatigue    Rx / DC Orders ED Discharge Orders     None        Lucrezia Starch, MD 03/17/21 1545

## 2021-03-20 ENCOUNTER — Other Ambulatory Visit: Payer: Self-pay

## 2021-03-20 ENCOUNTER — Ambulatory Visit (HOSPITAL_COMMUNITY)
Admission: RE | Admit: 2021-03-20 | Discharge: 2021-03-20 | Disposition: A | Discharge status: Home / Self Care | Payer: Medicare Other | Source: Ambulatory Visit | Attending: Radiation Oncology | Admitting: Radiation Oncology

## 2021-03-20 DIAGNOSIS — Q676 Pectus excavatum: Secondary | ICD-10-CM | POA: Diagnosis not present

## 2021-03-20 DIAGNOSIS — R911 Solitary pulmonary nodule: Secondary | ICD-10-CM | POA: Diagnosis not present

## 2021-03-20 DIAGNOSIS — C3490 Malignant neoplasm of unspecified part of unspecified bronchus or lung: Secondary | ICD-10-CM | POA: Diagnosis not present

## 2021-03-20 DIAGNOSIS — J439 Emphysema, unspecified: Secondary | ICD-10-CM | POA: Diagnosis not present

## 2021-03-20 DIAGNOSIS — I700 Atherosclerosis of aorta: Secondary | ICD-10-CM | POA: Diagnosis not present

## 2021-03-20 DIAGNOSIS — C3431 Malignant neoplasm of lower lobe, right bronchus or lung: Secondary | ICD-10-CM | POA: Diagnosis not present

## 2021-03-23 NOTE — Progress Notes (Signed)
Radiation Oncology         [phone] ________________________________  Name: [name] MRN: [id number]  Date: 03/24/2021  DOB: 1930-03-28  Follow-Up Visit Note  CC: [name], PA-C  Collene Gobble, MD    ICD-10-CM   1. Pulmonary nodule 1 cm or greater in diameter  R91.1 CT CHEST WO CONTRAST      Diagnosis: PET-avid right lower lobe lung nodule consistent with primary bronchogenic carcinoma; possible left upper lobe synchronous bronchogenic carcinoma  Interval Since Last Radiation: 10 months and 9 days   Radiation Treatment Dates: 05/02/2020 through 05/15/2020   Site / Technique / Total Dose (Gy) / Dose per Fx (Gy) / Completed Fx / Beam Energies Right lung; IMRT. A total of 60 Gy given at 12 Gy per fraction for a total of 5 fractions. 6XFFF beam energy.  Narrative:  The patient returns today for routine follow-up, she was last seen here for follow-up on 09/16/20.  Since her last visit, the patient followed up with her pulmonologist, Dr. Lamonte Sakai, on several occasions. During her most recent visit with Dr. Lamonte Sakai, the patient was noted to report significant exertional dyspnea, and recommended to uptitrate her oxygen on exertion (currently on 3L at rest, recommended to up her to 4 or 5L on exertion).   On 03/16/21, the patient presented to the ED per her daughter reporting her to exhibit increased lethargy, fatigue, lightheadedness, and an episode on impaired vision. Upon presentation to the ED, the patient denied feeling lethargic or lightheaded. Laboratory work-up, chest x-ray, EKG and VBG were all negative, and the patient was noted as well-appearing. Patient was discharged and recommended to follow-up with her pulmonologist and PCP.                                Chest CT on 03/20/21 demonstrated a further decrease in size of a superior segment right lower lobe spiculated pulmonary nodule. Mild progression of presumed adjacent radiation change was appreciated as well. Also  seen was a new 5 mm left upper lobe pulmonary nodule; recommended attention to follow up noted. Otherwise, no evidence of metastatic disease within the chest was visualized.    Other pertinent imaging since the patient was last seen includes an abdominal US on 01/02/21 which demonstrated stable chronic right-sided hydronephrosis with UPJ configuration, and bilateral nephrolithiasis calculi measuring up to 1.2 cm. A stable cystic lesion in the pancreatic head was noted as well. No sings of ascites appreciated.   She denies any pain within the chest area significant cough or hemoptysis.  She uses between 2 and 4 L of supplemental O2 depending on her activity.   Allergies:  is allergic to clarithromycin, tamsulosin, trazodone and nefazodone, and penicillins.  Meds: Current Outpatient Medications  Medication Sig Dispense Refill   acetaminophen (TYLENOL) 500 MG tablet Take 500 mg by mouth every 6 (six) hours as needed. Take 3 tablets every 6 hours     albuterol (PROAIR HFA) 108 (90 Base) MCG/ACT inhaler Inhale 2 puffs into the lungs every 6 (six) hours as needed for wheezing or shortness of breath. 18 g 5   apixaban (ELIQUIS) 2.5 MG TABS tablet Take 1 tablet (2.5 mg total) by mouth 2 (two) times daily. 180 tablet 3   buPROPion (WELLBUTRIN) 75 MG tablet Take 1 tablet by mouth once daily x 1 week. Take 1 tablet by mouth twice daily. 180 tablet 0   levothyroxine (SYNTHROID) 112  MCG tablet Take 1 tablet by mouth once daily 90 tablet 0   Melatonin 10 MG TABS Take 10 mg by mouth at bedtime.     metoprolol succinate (TOPROL-XL) 25 MG 24 hr tablet Take 1 tablet (25 mg total) by mouth daily. 90 tablet 3   OXYGEN Inhale 4 L/min into the lungs See admin instructions. Inhale 2 L/min into the lungs continuously and 3 L/min with any exertion     potassium chloride (KLOR-CON) 10 MEQ tablet Take 1 tablet (10 mEq total) by mouth every Monday, Wednesday, and Friday. 45 tablet 3   predniSONE (DELTASONE) 20 MG tablet  Take 1 tablet (20 mg total) by mouth daily with breakfast. 30 tablet 5   simvastatin (ZOCOR) 20 MG tablet TAKE 1 TABLET BY MOUTH AT BEDTIME 90 tablet 0   STIOLTO RESPIMAT 2.5-2.5 MCG/ACT AERS INHALE 2 PUFFS BY MOUTH ONCE DAILY 12 g 4   terbinafine (LAMISIL) 250 MG tablet Take 0.5 tablets (125 mg total) by mouth daily. (Patient not taking: No sig reported) 45 tablet 0   torsemide (DEMADEX) 10 MG tablet Take 1 tablet (10 mg total) by mouth daily as needed (edema). 90 tablet 0   amLODipine (NORVASC) 5 MG tablet Take 0.5 tablets (2.5 mg total) by mouth daily. 90 tablet 3   hydrOXYzine (ATARAX/VISTARIL) 10 MG tablet TAKE 1 TABLET BY MOUTH AT BEDTIME AS NEEDED (Patient not taking: No sig reported) 30 tablet 0   No current facility-administered medications for this encounter.    Physical Findings: The patient is in no acute distress. Patient is alert and oriented.  Supplemental oxygen in place by nasal cannula  height is _0  (1.626 m) and weight is 113 lb (51.3 kg). Her temporal temperature is 97.2 F (36.2 C) (abnormal). Her blood pressure is 146/77 (abnormal) and her pulse is 76. Her respiration is 20 and oxygen saturation is 95%. .  Lungs are clear to auscultation bilaterally. Heart has regular rate and rhythm. No palpable cervical, supraclavicular, or axillary adenopathy. Abdomen soft, non-tender, normal bowel sounds.   Lab Findings: Lab Results  Component Value Date   WBC 6.4 03/16/2021   HGB 16.0 (H) 03/16/2021   HCT 47.0 (H) 03/16/2021   MCV 101.3 (H) 03/16/2021   PLT 140 (L) 03/16/2021    Radiographic Findings: CT CHEST WO CONTRAST  Result Date: 03/21/2021 CLINICAL DATA:  Non-small-cell lung cancer. Evaluate treatment response. Malignant neoplasm of right lower lobe. Diagnosed in January. Aortic valve replacement. Radiation therapy complete. EXAM: CT CHEST WITHOUT CONTRAST TECHNIQUE: Multidetector CT imaging of the chest was performed following the standard protocol without IV  contrast. COMPARISON:  09/13/2020 FINDINGS: Cardiovascular: Aortic atherosclerosis. Tortuous thoracic aorta. Heart size and weighted by a pectus excavatum deformity. Median sternotomy for CABG. Mediastinum/Nodes: No mediastinal or definite hilar adenopathy, given limitations of unenhanced CT. Lungs/Pleura: No pleural fluid. Left-sided pleural thickening and calcification are similar. Moderate centrilobular emphysema. Posterior right upper lobe airspace disease and septal thickening are felt to be similar, including on 49/5. Superior segment right lower lobe pulmonary nodule measures 1.6 x 1.1 cm on 57/5 versus 2.5 x 1.9 cm on the prior exam. Surrounding interstitial thickening and contact with the pleural surface are likely treatment related. Dependent mild airspace disease just posterior and caudal to this lesion is new on 68/5 and likely due to evolving radiation change. Left lower lobe scarring and volume loss again identified. Calcified left lower lobe 5 mm granuloma on 77/5. Pleural-based left apical 4 mm nodule on 29/5 is  unchanged. Posterior left upper lobe 5 mm nodule on 34/5 is at the site of scarring on the prior exam but felt to be new. Upper Abdomen: Normal imaged portions of the liver, spleen, stomach, pancreas, adrenal glands, left kidney. Musculoskeletal: Osteopenia.  Remote lower right rib fractures. Accentuation of expected thoracic kyphosis with loss of vertebral body height at multiple levels in the upper to midthoracic spine, similar. IMPRESSION: 1. Further decrease in size of a superior segment right lower lobe spiculated pulmonary nodule. Mild progression of presumed adjacent radiation change. 2. 5 mm left upper lobe pulmonary nodule is felt to be new since the prior exam, and warrants follow-up attention. Otherwise, no evidence of metastatic disease within the chest. 3. Posterior right upper lobe airspace and ground-glass opacity is similar, possibly radiation induced. 4. Coronary artery  atherosclerosis. Aortic Atherosclerosis (ICD10-I70.0). 5. Osteopenia with chronic thoracic compression deformities. Electronically Signed   By: Abigail Miyamoto M.D.   On: 03/21/2021 11:27   DG Chest Portable 1 View  Result Date: 03/16/2021 CLINICAL DATA:  Shortness of breath. EXAM: PORTABLE CHEST 1 VIEW COMPARISON:  June 13, 2020. FINDINGS: Stable cardiomegaly. Sternotomy wires are noted. Both lungs are clear. The visualized skeletal structures are unremarkable. IMPRESSION: No active disease. Electronically Signed   By: Marijo Conception M.D.   On: 03/16/2021 16:39    Impression:  PET-avid right lower lobe lung nodule consistent with primary bronchogenic carcinoma; possible left upper lobe synchronous bronchogenic carcinoma  No evidence of recurrence on clinical exam today.  Recent chest CT scan favorable.  Plan: Routine follow-up in 6 months.  Prior to this follow-up appointment the patient will have a repeat CT scan of the chest.  Today I did print out a copy of the patient's chest CT scan and highlighted the important points   20 minutes of total time was spent for this patient encounter, including preparation, face-to-face counseling with the patient and coordination of care, physical exam, and documentation of the encounter and ordering of upcoming chest CT scan. ____________________________________  Blair Promise, PhD, MD   This document serves as a record of services personally performed by Gery Pray, MD. It was created on his behalf by Roney Mans, a trained medical scribe. The creation of this record is based on the scribe's personal observations and the provider's statements to them. This document has been checked and approved by the attending provider.

## 2021-03-24 ENCOUNTER — Encounter: Payer: Self-pay | Admitting: Radiation Oncology

## 2021-03-24 ENCOUNTER — Ambulatory Visit
Admission: RE | Admit: 2021-03-24 | Discharge: 2021-03-24 | Disposition: A | Discharge status: Home / Self Care | Payer: Medicare Other | Source: Ambulatory Visit | Attending: Radiation Oncology | Admitting: Radiation Oncology

## 2021-03-24 ENCOUNTER — Other Ambulatory Visit: Payer: Self-pay

## 2021-03-24 DIAGNOSIS — M8580 Other specified disorders of bone density and structure, unspecified site: Secondary | ICD-10-CM | POA: Diagnosis not present

## 2021-03-24 DIAGNOSIS — Z7901 Long term (current) use of anticoagulants: Secondary | ICD-10-CM | POA: Insufficient documentation

## 2021-03-24 DIAGNOSIS — I700 Atherosclerosis of aorta: Secondary | ICD-10-CM | POA: Insufficient documentation

## 2021-03-24 DIAGNOSIS — J432 Centrilobular emphysema: Secondary | ICD-10-CM | POA: Insufficient documentation

## 2021-03-24 DIAGNOSIS — Z79899 Other long term (current) drug therapy: Secondary | ICD-10-CM | POA: Diagnosis not present

## 2021-03-24 DIAGNOSIS — Z85118 Personal history of other malignant neoplasm of bronchus and lung: Secondary | ICD-10-CM | POA: Insufficient documentation

## 2021-03-24 DIAGNOSIS — R911 Solitary pulmonary nodule: Secondary | ICD-10-CM

## 2021-03-24 DIAGNOSIS — N132 Hydronephrosis with renal and ureteral calculous obstruction: Secondary | ICD-10-CM | POA: Insufficient documentation

## 2021-03-24 DIAGNOSIS — K862 Cyst of pancreas: Secondary | ICD-10-CM | POA: Diagnosis not present

## 2021-03-24 DIAGNOSIS — Z08 Encounter for follow-up examination after completed treatment for malignant neoplasm: Secondary | ICD-10-CM | POA: Diagnosis not present

## 2021-03-24 NOTE — Progress Notes (Signed)
[name] is here today for follow up post radiation to the lung.  Lung Side: Right, patient completed treatment on 05/15/20  Does the patient complain of any of the following: Pain:Patient denies pain Shortness of breath w/wo exertion: Yes, patient wears  oxygen  @ 2-4 liters via nasal canula.  Cough: no Hemoptysis: no Pain with swallowing: no Swallowing/choking concerns: Patient reports food gets struck in her throat.  Appetite: Good Energy Level: Patient reports having a low energy level.  Post radiation skin Changes: skin remains intact.     Additional comments if applicable:  Vitals:   81/18/86 1450  BP: (!) 146/77  Pulse: 76  Resp: 20  Temp: (!) 97.2 F (36.2 C)  TempSrc: Temporal  SpO2: 95%  Weight: 113 lb (51.3 kg)  Height: _0  (1.626 m)

## 2021-04-10 ENCOUNTER — Other Ambulatory Visit: Payer: Self-pay | Admitting: Physician Assistant

## 2021-04-15 DIAGNOSIS — J449 Chronic obstructive pulmonary disease, unspecified: Secondary | ICD-10-CM | POA: Diagnosis not present

## 2021-04-15 DIAGNOSIS — J438 Other emphysema: Secondary | ICD-10-CM | POA: Diagnosis not present

## 2021-04-16 ENCOUNTER — Other Ambulatory Visit: Payer: Self-pay | Admitting: *Deleted

## 2021-04-16 NOTE — Patient Outreach (Signed)
[facility]) Care Management [place] Note   04/16/2021 Name:  [name] MRN:  [id number] DOB:  1929-11-10  Summary: Per patient she is having some difficulty today due to the damp weather. She stated her chest feels a little tight. She is taking all her medications as per ordered. Patient is on O2 3-4 liters depending on activity. She is able to afford her medications. She has not had any COPD exacerbation in the past 6 months She is still driving to church. Her sons and family assists with transportation and grocery shopping. She has received her flu vaccine and COVID vaccines. Her appetite is good.   Recommendations/Changes made from today's visit: RN discussed monitoring blood pressure RN reiterated taking medications as  per ordered    Subjective: [name] is an 87 y.o. year old female who is a primary patient of [name], PA-C. The care management team was consulted for assistance with care management and/or care coordination needs.    RN Health Coach completed Telephone Visit today.   Objective:  Medications Reviewed Today     Reviewed by Verlin Grills, RN (Case Manager) on 04/16/21 at 75  Med List Status: <None>   Medication Order Taking? Sig Documenting Provider Last Dose Status Informant  acetaminophen (TYLENOL) 500 MG tablet [id number]  Take 500 mg by mouth every 6 (six) hours as needed. Take 3 tablets every 6 hours [provider]  Active Self  albuterol (PROAIR HFA) 108 (90 Base) MCG/ACT inhaler [id number]  Inhale 2 puffs into the lungs every 6 (six) hours as needed for wheezing or shortness of breath. Collene Gobble, MD  Active   amLODipine (NORVASC) 5 MG tablet [id number]  Take 0.5 tablets (2.5 mg total) by mouth daily. Imogene Burn, PA-C  Expired 02/08/21 2359   apixaban (ELIQUIS) 2.5 MG TABS tablet [id number]  Take 1 tablet (2.5 mg total) by mouth 2 (two) times daily. Ledora Bottcher, PA  Active   buPROPion  [facility]) 75 MG tablet [id number]  Take 1 tablet by mouth once daily x 1 week. Take 1 tablet by mouth twice daily. [name], PA-C  Active   hydrOXYzine (ATARAX/VISTARIL) 10 MG tablet [id number]  TAKE 1 TABLET BY MOUTH AT BEDTIME AS NEEDED  Patient not taking: No sig reported   Abonza, Maritza, PA-C  Active   levothyroxine (SYNTHROID) 112 MCG tablet [id number]  Take 1 tablet by mouth once daily [name], PA-C  Active   Melatonin 10 MG TABS [id number]  Take 10 mg by mouth at bedtime. [provider]  Active Self  metoprolol succinate (TOPROL-XL) 25 MG 24 hr tablet [id number]  Take 1 tablet (25 mg total) by mouth daily. Ledora Bottcher, [place]  Active   OXYGEN [id number] Yes Inhale 4 L/min into the lungs See admin instructions. Inhale 2 L/min into the lungs continuously and 3 L/min with any exertion [provider] Taking Active Self  potassium chloride (KLOR-CON) 10 MEQ tablet [id number] Yes Take 1 tablet (10 mEq total) by mouth every Monday, Wednesday, and Friday. Belva Crome, MD Taking Active   predniSONE (DELTASONE) 20 MG tablet [id number] Yes Take 1 tablet (20 mg total) by mouth daily with breakfast. Collene Gobble, MD Taking Active   simvastatin (ZOCOR) 20 MG tablet [id number] Yes TAKE 1 TABLET BY MOUTH AT BEDTIME [name], PA-C Taking Active   STIOLTO RESPIMAT 2.5-2.5 MCG/ACT AERS [id number]  INHALE 2 PUFFS BY MOUTH ONCE DAILY Collene Gobble,  MD  Active   terbinafine (LAMISIL) 250 MG tablet [id number]  Take 0.5 tablets (125 mg total) by mouth daily.  Patient not taking: No sig reported   Abonza, Maritza, PA-C  Active   torsemide (DEMADEX) 10 MG tablet [id number]  Take 1 tablet (10 mg total) by mouth daily as needed (edema). [name], PA-C  Active              SDOH:  (Social Determinants of Health) assessments and interventions performed:  SDOH Interventions    Flowsheet Row Most Recent Value  SDOH Interventions   Food Insecurity Interventions  Intervention Not Indicated  Housing Interventions Intervention Not Indicated  Transportation Interventions Intervention Not Indicated  Depression Interventions/Treatment  Medication       Care Plan  Review of patient past medical history, allergies, medications, health status, including review of consultants reports, laboratory and other test data, was performed as part of comprehensive evaluation for care management services.   Care Plan : COPD (Adult)  Updates made by Verlin Grills, RN since 04/16/2021 12:00 AM     Problem: Disease Progression (COPD) Resolved 04/16/2021  Priority: Medium  Note:   [id number] Resolving due to duplicate goal     Long-Range Goal: Disease Progression Minimized or Managed Completed 04/16/2021  Start Date: 06/21/2020  Expected End Date: 06/17/2021  Recent Progress: On track  Priority: Medium  Note:   Evidence-based guidance:  Identify current smoking/tobacco use; provide smoking cessation intervention.  Assess symptom control by the frequency and type of symptoms, reliever use and activity limitation at every encounter.  Assess risk for exacerbation (flare up) by evaluating spirometry, pulse oximetry, reliever use, presentation of symptoms and activity limitation; anticipate treatment adjustment based on risks and resources.  Develop and/or review and reinforce use of COPD rescue (action) plan even when symptoms are controlled or infrequent.  Ask patient to bring inhaler to all visits; assess and reinforce correct technique; address barriers to proper inhaler use, such as older age, use of multiple devices and lack of understanding.   Identify symptom triggers, such as smoking, virus, weather change, emotional upset, exercise, obesity and environmental allergen; consider reduction of work-exposure versus elimination to avoid compromising employment.  Correlate presentation to comorbidity, such as diabetes, heart failure, obstructive sleep apnea,  depression and anxiety, which may worsen symptoms.  Prepare for individualized pharmacologic therapy that may include LABA (long-acting beta-2 agonist), LAMA (long-acting muscarinic antagonist), SABA (short-acting beta-2 agonist) oral or inhaled corticosteroid.  Promote participation in pulmonary rehabilitation for breathing exercises, skills training, improved exercise capacity, mood and quality of life; address barriers to participation.  Promote physical activity or exercise to improve or maintain exercise capacity, based on tolerance that may include walking, water exercise, cycling or limb muscle strength training.  Promote use of energy conservation and activity pacing techniques.  Promote use of breathing and coughing techniques, such as inspiratory muscle training, pursed-lip breathing, diaphragmatic breathing, pranayama yoga breathing or huff cough.  Screen for malnutrition risk factors, such as unintentional weight loss and poor oral intake; refer to dietitian if identified.  Consider recommendation for oral drink supplement or multivitamin and mineral supplements if suspect inadequate oral intake or micronutrient deficiencies.   Screen for obstructive sleep apnea; prepare patient for polysomnography based on risk and presentation.  Prepare patient for use of long-term oxygen and noninvasive ventilation to relieve hypercapnia, hypoxemia, obstructive sleep apnea and reduce work of breathing.  Prepare patient with worsening disease for surgical interventions that may include bronchoscopy, lung volume  reduction surgery, bullectomy or lung transplantation.   Notes:     Task: Alleviate Barriers to COPD Management Completed 04/16/2021  Due Date: 06/17/2021  Priority: Routine  Note:   Care Management Activities:    - barriers to treatment managed - breathing techniques encouraged - communicable disease prevention promoted - medication-adherence assessment completed - quality of sleep  assessed - rescue (action) plan reviewed - screen for functional limitations completed and reviewed - self-awareness of symptom triggers encouraged    Notes:  10/25- Pt remains adherent to the discussed plan of care today and has not experienced any barriers to prevent ongoing management of care. Avoiding environmental barriers that would case flare ups. 9/20-Pt continue to overcome potential barriers and avoid places that may aggravate her COPD.  6/3-Will strongly encouraged pt to avoid acute triggers that my result in exacerbated symptoms for her COPD. Verified pt is aware of her limits with mobility and continue to rest when needed not to over exert herself with activities. 2/4-Educated on the above interventions and thoroughly discussed the plan of care related. Discussed possible barriers and triggers to avoid to prevent acute flare-ups with her COPD. Confirm use of her home O2 currently being used at 3-4 liters with MD awareness.     Problem: Symptom Exacerbation (COPD) Resolved 04/16/2021  Priority: Medium  Note:   [id number] Resolving due to duplicate goal     Goal: Symptom Exacerbation Prevented or Minimized Completed 04/16/2021  Start Date: 06/21/2020  Expected End Date: 06/17/2021  Recent Progress: On track  Priority: Medium  Note:   Evidence-based guidance:  Monitor for signs of respiratory infection, including changes in sputum color, volume and thickness, as well as fever.  Encourage infection prevention strategies that may include prophylactic antibiotic therapy for patients with history of frequent exacerbations or antibiotic administration during exacerbation based on presentation, risk and benefit.  Encourage receipt of influenza and pneumococcal vaccine.  Prepare patient for use of home long-term oxygen therapy in presence of sever resting hypoxemia.  Prepare patients for laboratory studies or diagnostic exams, such as spirometry, pulse oximetry and arterial blood gas  based on current symptoms, risk factors and presentation.  Assess barriers and manage adherence, including inhaler technique and persistent trigger exposure; encourage adherence, even when symptoms are controlled or infrequent.  Assess and monitor for signs/symptoms of psychosocial concerns, such as shortness of breath-anxiety cycle or depression that may impact stability of symptoms.  Identify economic resources, sociocultural beliefs, social factors and health literacy that may interfere with adherence.  Promote lifestyle changes when needed, including regular physical activity based on tolerance, weight loss, healthy eating and stress management.  Consider referral to nurse or community health worker or home-visiting program for intensive support and education (disease-management program).  Increase frequency of follow-up following exacerbation or hospitalization; consider transition of care interventions, such as hospital visit, home visit, telephone follow-up, review of discharge summary and resource referrals.   Notes:     Task: Identify and Minimize Risk of COPD Exacerbation Completed 04/16/2021  Due Date: 06/17/2021  Priority: Routine  Note:   Care Management Activities:    - barriers to lifestyle changes reviewed and addressed - barriers to treatment reviewed and addressed - breathing techniques encouraged - healthy lifestyle promoted - rescue (action) plan reviewed - signs/symptoms of infection reviewed - signs/symptoms of worsening disease assessed - symptom diary reviewed - symptom triggers identified - treatment plan reviewed    Notes:  10/25-Pt continues to minimize her risk for flare-ups staying in the  house with limited outings. Reports she is in the GREEN zone with no reported exacerbated symptoms over the last month. Reviewed all medications and verified pt continue to use her inhalers as prescribed. Will continue to encouraged adherence to the discussed plan of  care. 9/20-Pt continues to avoid triggers that may aggravate her COPD symptoms. States she continues to take all COPD medications to avoid exacerbations changes. Remains in the GREEN zone and now on 4 liters to accommodate her needs. 8/17-Pt w/ congestion and completed antibiotics and cough medicine. Pt continue to be in the GREEN zone however in/out of congestions with thick mucus present at times. Pt saturations in the 90's at rest and 70's with any exertion. Supportive son to assist and pt has life alert for any emergencies. 6/3-Will continue to reiterate on the above goals and interventions to assist pt in prevent acute symptoms. Verified pt remains in the GREEN zone today with no acute symptoms. 4/26-Denies any swelling today and however limited with her mobility she reports no additional SOB today after use of her medications. Pt aware to use 4 liters due to the long tubing for full oxygenation however to check with her provider provider to long term use. Pt is in the GREEN zone today without symptoms.              3/24-Verified pt remains in the GREEN zone with no acute symptoms. Pt continues to use her schedule dosing of inhalers and indicates 2-3 X weekly on her ProAir.  2/4-COPD action plan discussed and confirmed pt is in the GREEN zone with no acute symptoms at this time. Discussed all zones and what to do if acute. Pt with complete understanding. Discussed how to avoid triggers to prevent acute symptoms and use of all inhalers and medications for her COPD.    Care Plan : RN Care Manager Plan Of Care  Updates made by Heloise Gordan, Eppie Gibson, RN since 04/16/2021 12:00 AM     Problem: Knowledge Deficit related to COPD and Care Coordination Needs   Priority: High     Long-Range Goal: Development Plan of Care for Management of COPD   Start Date: 04/16/2021  Expected End Date: 05/16/2022  Priority: High  Note:   Current Barriers:  Knowledge Deficits related to plan of care for management of  HTN and COPD   RNCM Clinical Goal(s):  Patient will verbalize understanding of plan for management of HTN and COPD as evidenced by Continuation of monitoring blood pressure and monitoring for triggers of COPD exacerbation  through collaboration with RN Care manager, provider, and care team.   Interventions: Inter-disciplinary care team collaboration (see longitudinal plan of care) Evaluation of current treatment plan related to  self management and patient's adherence to plan as established by provider RN provided education on Eating plan for COPD RN provided education on Frequent resting for COPD COPD tips RN sent booklet A matter of choices blood pressure control Patient will monitor blood pressure once a week    Patient Goals/Self-Care Activities: Take medications as prescribed   Attend all scheduled provider appointments Call pharmacy for medication refills 3-7 days in advance of running out of medications Attend church or other social activities Perform all self care activities independently  Perform IADL's (shopping, preparing meals, housekeeping, managing finances) independently Call provider office for new concerns or questions  - identify and remove indoor air pollutants - limit outdoor activity during cold weather - listen for public air quality announcements every day - develop a rescue  plan - eliminate symptom triggers at home - follow rescue plan if symptoms flare-up - keep follow-up appointments: with PCP and Pulmonologist - eat healthy/prescribed diet: low sodium diet - get at least 7 to 8 hours of sleep at night check blood pressure weekly learn about high blood pressure call doctor for signs and symptoms of high blood pressure keep all doctor appointments take medications for blood pressure exactly as prescribed report new symptoms to your doctor limit salt intake to less than 2300 mg/day        Plan: Telephone follow up appointment with care management  team member scheduled for:  February 2023 The patient has been provided with contact information for the care management team and has been advised to call with any health related questions or concerns.  RN sent A matter of choice blood pressure control RN sent educational material on eating plan RN sent educational material on COPD and activity  [facility] [phone]

## 2021-04-16 NOTE — Patient Instructions (Signed)
Visit Information  Thank you for taking time to visit with me today. Please don't hesitate to contact me if I can be of assistance to you before our next scheduled telephone appointment.  Following are the goals we discussed today:  Current Barriers:  Knowledge Deficits related to plan of care for management of HTN and COPD   RNCM Clinical Goal(s):  Patient will verbalize understanding of plan for management of HTN and COPD as evidenced by Continuation of monitoring blood pressure and monitoring for triggers of COPD exacerbation through collaboration with RN Care manager, provider, and care team.   Interventions: Inter-disciplinary care team collaboration (see longitudinal plan of care) Evaluation of current treatment plan related to  self management and patient's adherence to plan as established by provider RN provided education on Eating plan for COPD RN provided education on Frequent resting for COPD COPD tips RN sent booklet A matter of choices blood pressure control Patient will monitor blood pressure once a week    Patient Goals/Self-Care Activities: Take medications as prescribed   Attend all scheduled provider appointments Call pharmacy for medication refills 3-7 days in advance of running out of medications Attend church or other social activities Perform all self care activities independently  Perform IADL's (shopping, preparing meals, housekeeping, managing finances) independently Call provider office for new concerns or questions  - identify and remove indoor air pollutants - limit outdoor activity during cold weather - listen for public air quality announcements every day - develop a rescue plan - eliminate symptom triggers at home - follow rescue plan if symptoms flare-up - keep follow-up appointments: with PCP and Pulmonologist - eat healthy/prescribed diet: low sodium diet - get at least 7 to 8 hours of sleep at night check blood pressure weekly learn about  high blood pressure call doctor for signs and symptoms of high blood pressure keep all doctor appointments take medications for blood pressure exactly as prescribed report new symptoms to your doctor limit salt intake to less than 2300 mg/day   Our next appointment is by telephone on February 2023  Please call your health RN Health Coach at [phone] if you need to cancel or reschedule your appointment.   Please call the Suicide and Crisis Lifeline: 988 if you are experiencing a Mental Health or [place] or need someone to talk to.  The patient verbalized understanding of instructions, educational materials, and care plan provided today and agreed to receive a mailed copy of patient instructions, educational materials, and care plan.   Telephone follow up appointment with care management team member scheduled for: The patient has been provided with contact information for the care management team and has been advised to call with any health related questions or concerns.   [facility] [phone]

## 2021-04-21 ENCOUNTER — Other Ambulatory Visit: Payer: Self-pay | Admitting: Emergency Medicine

## 2021-05-14 ENCOUNTER — Other Ambulatory Visit: Payer: Self-pay | Admitting: Physician Assistant

## 2021-05-14 DIAGNOSIS — E038 Other specified hypothyroidism: Secondary | ICD-10-CM

## 2021-05-23 ENCOUNTER — Emergency Department (HOSPITAL_COMMUNITY)
Admission: EM | Admit: 2021-05-23 | Discharge: 2021-05-23 | Disposition: A | Discharge status: Home / Self Care | Payer: Medicare Other | Attending: Emergency Medicine | Admitting: Emergency Medicine

## 2021-05-23 ENCOUNTER — Emergency Department (HOSPITAL_COMMUNITY): Payer: Medicare Other

## 2021-05-23 ENCOUNTER — Ambulatory Visit
Admission: EM | Admit: 2021-05-23 | Discharge: 2021-05-23 | Disposition: A | Discharge status: Home / Self Care | Payer: Medicare Other

## 2021-05-23 ENCOUNTER — Other Ambulatory Visit: Payer: Self-pay

## 2021-05-23 DIAGNOSIS — Z7901 Long term (current) use of anticoagulants: Secondary | ICD-10-CM | POA: Diagnosis not present

## 2021-05-23 DIAGNOSIS — J449 Chronic obstructive pulmonary disease, unspecified: Secondary | ICD-10-CM | POA: Insufficient documentation

## 2021-05-23 DIAGNOSIS — Z87891 Personal history of nicotine dependence: Secondary | ICD-10-CM | POA: Diagnosis not present

## 2021-05-23 DIAGNOSIS — I2510 Atherosclerotic heart disease of native coronary artery without angina pectoris: Secondary | ICD-10-CM | POA: Insufficient documentation

## 2021-05-23 DIAGNOSIS — Z79899 Other long term (current) drug therapy: Secondary | ICD-10-CM | POA: Diagnosis not present

## 2021-05-23 DIAGNOSIS — M8448XA Pathological fracture, other site, initial encounter for fracture: Secondary | ICD-10-CM | POA: Diagnosis not present

## 2021-05-23 DIAGNOSIS — N189 Chronic kidney disease, unspecified: Secondary | ICD-10-CM | POA: Diagnosis not present

## 2021-05-23 DIAGNOSIS — J984 Other disorders of lung: Secondary | ICD-10-CM | POA: Diagnosis not present

## 2021-05-23 DIAGNOSIS — S2231XA Fracture of one rib, right side, initial encounter for closed fracture: Secondary | ICD-10-CM | POA: Diagnosis not present

## 2021-05-23 DIAGNOSIS — R9431 Abnormal electrocardiogram [ECG] [EKG]: Secondary | ICD-10-CM | POA: Diagnosis not present

## 2021-05-23 DIAGNOSIS — S22060A Wedge compression fracture of T7-T8 vertebra, initial encounter for closed fracture: Secondary | ICD-10-CM | POA: Diagnosis not present

## 2021-05-23 DIAGNOSIS — R072 Precordial pain: Secondary | ICD-10-CM | POA: Diagnosis not present

## 2021-05-23 DIAGNOSIS — N281 Cyst of kidney, acquired: Secondary | ICD-10-CM | POA: Diagnosis not present

## 2021-05-23 DIAGNOSIS — I10 Essential (primary) hypertension: Secondary | ICD-10-CM | POA: Diagnosis not present

## 2021-05-23 DIAGNOSIS — C3490 Malignant neoplasm of unspecified part of unspecified bronchus or lung: Secondary | ICD-10-CM | POA: Diagnosis not present

## 2021-05-23 DIAGNOSIS — R1013 Epigastric pain: Secondary | ICD-10-CM | POA: Insufficient documentation

## 2021-05-23 DIAGNOSIS — I129 Hypertensive chronic kidney disease with stage 1 through stage 4 chronic kidney disease, or unspecified chronic kidney disease: Secondary | ICD-10-CM | POA: Insufficient documentation

## 2021-05-23 DIAGNOSIS — R079 Chest pain, unspecified: Secondary | ICD-10-CM | POA: Diagnosis not present

## 2021-05-23 DIAGNOSIS — R0602 Shortness of breath: Secondary | ICD-10-CM | POA: Diagnosis not present

## 2021-05-23 DIAGNOSIS — K8689 Other specified diseases of pancreas: Secondary | ICD-10-CM | POA: Diagnosis not present

## 2021-05-23 DIAGNOSIS — K3189 Other diseases of stomach and duodenum: Secondary | ICD-10-CM | POA: Diagnosis not present

## 2021-05-23 DIAGNOSIS — N200 Calculus of kidney: Secondary | ICD-10-CM | POA: Diagnosis not present

## 2021-05-23 DIAGNOSIS — I517 Cardiomegaly: Secondary | ICD-10-CM | POA: Diagnosis not present

## 2021-05-23 DIAGNOSIS — R918 Other nonspecific abnormal finding of lung field: Secondary | ICD-10-CM | POA: Diagnosis not present

## 2021-05-23 DIAGNOSIS — J439 Emphysema, unspecified: Secondary | ICD-10-CM | POA: Diagnosis not present

## 2021-05-23 LAB — URINALYSIS, ROUTINE W REFLEX MICROSCOPIC
Bilirubin Urine: NEGATIVE
Glucose, UA: 100 mg/dL — AB
Ketones, ur: NEGATIVE mg/dL
Leukocytes,Ua: NEGATIVE
Nitrite: NEGATIVE
Protein, ur: NEGATIVE mg/dL
Specific Gravity, Urine: 1.015 (ref 1.005–1.030)
pH: 7 (ref 5.0–8.0)

## 2021-05-23 LAB — CBC
HCT: 48.6 % — ABNORMAL HIGH (ref 36.0–46.0)
Hemoglobin: 15.8 g/dL — ABNORMAL HIGH (ref 12.0–15.0)
MCH: 33.1 pg (ref 26.0–34.0)
MCHC: 32.5 g/dL (ref 30.0–36.0)
MCV: 101.9 fL — ABNORMAL HIGH (ref 80.0–100.0)
Platelets: 137 10*3/uL — ABNORMAL LOW (ref 150–400)
RBC: 4.77 MIL/uL (ref 3.87–5.11)
RDW: 12.9 % (ref 11.5–15.5)
WBC: 10.9 10*3/uL — ABNORMAL HIGH (ref 4.0–10.5)
nRBC: 0 % (ref 0.0–0.2)

## 2021-05-23 LAB — BASIC METABOLIC PANEL
Anion gap: 6 (ref 5–15)
BUN: 24 mg/dL — ABNORMAL HIGH (ref 8–23)
CO2: 34 mmol/L — ABNORMAL HIGH (ref 22–32)
Calcium: 9.7 mg/dL (ref 8.9–10.3)
Chloride: 94 mmol/L — ABNORMAL LOW (ref 98–111)
Creatinine, Ser: 1.76 mg/dL — ABNORMAL HIGH (ref 0.44–1.00)
GFR, Estimated: 27 mL/min — ABNORMAL LOW (ref >60)
Glucose, Bld: 135 mg/dL — ABNORMAL HIGH (ref 70–99)
Potassium: 4.8 mmol/L (ref 3.5–5.1)
Sodium: 134 mmol/L — ABNORMAL LOW (ref 135–145)

## 2021-05-23 LAB — LIPASE, BLOOD: Lipase: 32 U/L (ref 11–51)

## 2021-05-23 LAB — URINALYSIS, MICROSCOPIC (REFLEX): Bacteria, UA: NONE SEEN

## 2021-05-23 LAB — TROPONIN I (HIGH SENSITIVITY)
Troponin I (High Sensitivity): 16 ng/L (ref <18)
Troponin I (High Sensitivity): 15 ng/L (ref <18)

## 2021-05-23 MED ORDER — OXYCODONE HCL 5 MG PO TABS
5.0000 mg | ORAL_TABLET | ORAL | 0 refills | Status: DC | PRN
Start: 2021-05-23 — End: 2021-06-05

## 2021-05-23 MED ORDER — OXYCODONE HCL 5 MG PO TABS
5.0000 mg | ORAL_TABLET | ORAL | Status: AC
  Administered 2021-05-23: 5 mg via ORAL
  Filled 2021-05-23: qty 1

## 2021-05-23 NOTE — Discharge Instructions (Signed)
You have a compression fracture of your eighth thoracic vertebrae. You are given a prescription for pain medicine Please follow-up with your doctor on Monday.

## 2021-05-23 NOTE — ED Provider Triage Note (Signed)
Emergency Medicine Provider Triage Evaluation Note  [name] , a 88 y.o. female  was evaluated in triage.  Pt complains of epigastric pain.  Patient was sent here from urgent care due to chest pain.  She states that she is having epigastric pain that radiates to her right upper quadrant.  She does have lung cancer in her right lung.  On baseline 2 L with baseline shortness of breath.  She does state over the past 2 to 3 days she has had worsening epigastric abdominal pain associated with nausea without vomiting.  She also endorses diarrhea.  Denies fevers.  There is a note previously that endorses decreased urination however she does not endorse this with me.  Review of Systems  Positive: See above Negative:   Physical Exam  BP (!) 156/90 (BP Location: Right Arm)    Pulse 74    Temp 98.4 F (36.9 C) (Oral)    Resp 20    SpO2 91%  Gen:   Awake, no distress   Resp:  Normal effort, decreased lung sounds on the right MSK:   Moves extremities without difficulty  Other:  Abdomen is rounded and distended.  Epigastric tenderness to palpation.  She states that her abdomen is usually somewhat distended.  Medical Decision Making  Medically screening exam initiated at 2:01 PM.  Appropriate orders placed.  Zada Girt was informed that the remainder of the evaluation will be completed by another provider, this initial triage assessment does not replace that evaluation, and the importance of remaining in the ED until their evaluation is complete.     Mickie Hillier, PA-C 05/23/21 [phone]

## 2021-05-23 NOTE — ED Triage Notes (Signed)
Pt here POV from UC d/t chest pain. Upper epigastric pain radiating to right flank . Lung cancer in right lung. Baseline 2 liters [place] at rest. Endorses increased SOB and nausea. Deceased urination.

## 2021-05-23 NOTE — ED Provider Notes (Signed)
Patient here today with intense epigastric pain that started 2 days ago and has worsened since onset. She states that she had difficulty sleeping all night last night. Given significant PMH, advanced age recommend further evaluation in the ED asap. Patient is agreeable, son will drive her. Offered EMS and patient declines respectfully.    Francene Finders, PA-C 05/23/21 1138

## 2021-05-23 NOTE — ED Notes (Signed)
Pt's son returned call and was updated on pt's d/c disposition. He says he will be there to pick her up in 45 minutes.

## 2021-05-23 NOTE — ED Triage Notes (Signed)
Pt c/o abd pain onset Wednesday. Pt was walking at home then felt "something just ripped inside of me." Sharp pain pointing to lower sternum states that it radiates "all over my torso."

## 2021-05-23 NOTE — ED Provider Notes (Signed)
[facility]ERGENCY DEPARTMENT Provider Note   CSN: [id number] Arrival date & time: 05/23/21  1201     History  Chief Complaint  Patient presents with   Chest Pain    [name] is a 88 y.o. female.  HPI 88 year old female history of hypertension, status post aortic valve repair, carotid artery disease, history of smoking, history of COPD, history of lung cancer on palliative care, chronic respiratory failure with oxygen dependency, chronic kidney disease, presents today complaining of sudden severe onset of upper abdominal pain that began greater than 24 hours ago.  Pain is severe and in the epigastric region.  She described as a sudden severe pain with movement.  It is worsens with movement.  She had 1 episode of nausea and reports no vomiting but decreased p.o. food intake.  She denies fever, chills, pain with urination.  She reports some decreased urinary output.  She reports that she has problems with one of her kidneys.     Home Medications Prior to Admission medications   Medication Sig Start Date End Date Taking? Authorizing Provider  acetaminophen (TYLENOL) 500 MG tablet Take 500 mg by mouth every 6 (six) hours as needed. Take 3 tablets every 6 hours   Yes [provider]  albuterol (PROAIR HFA) 108 (90 Base) MCG/ACT inhaler Inhale 2 puffs into the lungs every 6 (six) hours as needed for wheezing or shortness of breath. 10/23/20  Yes Collene Gobble, MD  amLODipine (NORVASC) 5 MG tablet Take 0.5 tablets (2.5 mg total) by mouth daily. 07/17/20 05/23/21 Yes Imogene Burn, PA-C  apixaban (ELIQUIS) 2.5 MG TABS tablet Take 1 tablet (2.5 mg total) by mouth 2 (two) times daily. 06/15/20  Yes Duke, Tami Lin, PA  Artificial Tear Ointment (DRY EYES OP) Apply 1 drop to eye as needed (dryness).   Yes [provider]  buPROPion (WELLBUTRIN) 75 MG tablet Take 1 tablet by mouth once daily x 1 week. Take 1 tablet by mouth twice daily. 02/17/21  Yes  Abonza, Herb Grays, PA-C  hydrOXYzine (ATARAX/VISTARIL) 10 MG tablet TAKE 1 TABLET BY MOUTH AT BEDTIME AS NEEDED 12/10/20  Yes Abonza, Maritza, PA-C  levothyroxine (SYNTHROID) 112 MCG tablet Take 1 tablet by mouth once daily 05/15/21  Yes Abonza, Maritza, PA-C  metoprolol succinate (TOPROL-XL) 25 MG 24 hr tablet Take 1 tablet (25 mg total) by mouth daily. 06/16/20  Yes Duke, Tami Lin, PA  oxyCODONE (ROXICODONE) 5 MG immediate release tablet Take 1 tablet (5 mg total) by mouth every 4 (four) hours as needed for severe pain. 05/23/21  Yes Pattricia Boss, MD  potassium chloride (KLOR-CON) 10 MEQ tablet Take 1 tablet (10 mEq total) by mouth every Monday, Wednesday, and Friday. 10/04/20  Yes Belva Crome, MD  predniSONE (DELTASONE) 20 MG tablet Take 1 tablet (20 mg total) by mouth daily with breakfast. 10/23/20  Yes Collene Gobble, MD  simvastatin (ZOCOR) 20 MG tablet TAKE 1 TABLET BY MOUTH AT BEDTIME 04/14/21  Yes Abonza, Maritza, PA-C  terbinafine (LAMISIL) 250 MG tablet Take 0.5 tablets (125 mg total) by mouth daily. Patient not taking: Reported on 03/11/2021 02/19/21   [name], PA-C  Tiotropium Bromide-Olodaterol (STIOLTO RESPIMAT) 2.5-2.5 MCG/ACT AERS INHALE 2 PUFFS BY MOUTH ONCE DAILY 04/23/21  Yes Collene Gobble, MD  torsemide (DEMADEX) 10 MG tablet Take 1 tablet (10 mg total) by mouth daily as needed (edema). 02/18/21  Yes [name], PA-C  Melatonin 10 MG TABS Take 10 mg by  mouth at bedtime. Patient not taking: Reported on 05/23/2021    [provider]  OXYGEN Inhale 4 L/min into the lungs See admin instructions. Inhale 2 L/min into the lungs continuously and 3 L/min with any exertion    [provider]      Allergies    Clarithromycin, Tamsulosin, Trazodone and nefazodone, and Penicillins    Review of Systems   Review of Systems  All other systems reviewed and are negative.  Physical Exam Updated Vital Signs BP (!) 160/95    Pulse 66    Temp 98.6 F (37 C)  (Oral)    Resp 16    Ht 1.626 m (_0 )    Wt 52.6 kg    SpO2 96%    BMI 19.91 kg/m  Physical Exam Vitals and nursing note reviewed.  Constitutional:      General: She is not in acute distress.    Appearance: She is well-developed. She is ill-appearing.  HENT:     Head: Normocephalic.  Cardiovascular:     Rate and Rhythm: Normal rate and regular rhythm.     Pulses:          Carotid pulses are 1+ on the right side and 1+ on the left side.      Dorsalis pedis pulses are 1+ on the right side and 1+ on the left side.     Heart sounds: Normal heart sounds.     Comments: Femoral pulses intact 2+ and equal Pulmonary:     Effort: Pulmonary effort is normal.     Breath sounds: Normal breath sounds.  Abdominal:     General: There is no abdominal bruit.     Palpations: Abdomen is soft.     Tenderness: There is abdominal tenderness.     Comments: Distention noted with palpable mass consistent with distended urinary bladder  Musculoskeletal:        General: Normal range of motion.     Cervical back: Normal range of motion.     Right lower leg: No tenderness.     Left lower leg: No tenderness.  Skin:    General: Skin is warm and dry.     Capillary Refill: Capillary refill takes less than 2 seconds.  Neurological:     General: No focal deficit present.     Mental Status: She is alert.  Psychiatric:        Mood and Affect: Mood normal.    ED Results / Procedures / Treatments   Labs (all labs ordered are listed, but only abnormal results are displayed) Labs Reviewed  BASIC METABOLIC PANEL - Abnormal; Notable for the following components:      Result Value   Sodium 134 (*)    Chloride 94 (*)    CO2 34 (*)    Glucose, Bld 135 (*)    BUN 24 (*)    Creatinine, Ser 1.76 (*)    GFR, Estimated 27 (*)    All other components within normal limits  CBC - Abnormal; Notable for the following components:   WBC 10.9 (*)    Hemoglobin 15.8 (*)    HCT 48.6 (*)    MCV 101.9 (*)    Platelets  137 (*)    All other components within normal limits  LIPASE, BLOOD  URINALYSIS, ROUTINE W REFLEX MICROSCOPIC  TROPONIN I (HIGH SENSITIVITY)  TROPONIN I (HIGH SENSITIVITY)    EKG EKG Interpretation  Date/Time:  Friday May 23 2021 13:49:24 EST Ventricular Rate:  72 PR Interval:  154 QRS Duration: 72 QT Interval:  406 QTC Calculation: 444 R Axis:   -53 Text Interpretation: Normal sinus rhythm Possible Left atrial enlargement Left axis deviation Left ventricular hypertrophy with repolarization abnormality ( Anterior infarct , age undetermined Abnormal ECG When compared with ECG of 23-May-2021 11:22, PREVIOUS ECG IS PRESENT No significant change since last tracing earlier same date Confirmed by Pattricia Boss [phone]) on 05/23/2021 7:41:24 PM  Radiology DG Chest 2 View  Result Date: 05/23/2021 CLINICAL DATA:  Chest pain. Shortness of breath. Right lower lobe non-small-cell lung cancer. EXAM: CHEST - 2 VIEW COMPARISON:  03/20/2021 CT. FINDINGS: Osteopenia. Accentuation of expected thoracic kyphosis. These were detailed on CT of 09/13/2020 Prior median sternotomy. Midline trachea. Mild cardiomegaly. Atherosclerosis in the transverse aorta. Trace left pleural fluid. No pneumothorax. Moderate hyperinflation and central airway thickening. Right perihilar airspace disease is likely due to prior radiation therapy. No superimposed acute lobar consolidation. Remote lower right rib fractures. IMPRESSION: No acute cardiopulmonary disease. COPD/chronic bronchitis with radiation induced right perihilar opacity. Aortic Atherosclerosis (ICD10-I70.0). Electronically Signed   By: Abigail Miyamoto M.D.   On: 05/23/2021 14:36   CT CHEST ABDOMEN PELVIS WO CONTRAST  Result Date: 05/23/2021 CLINICAL DATA:  Chest pain or back pain, aortic dissection suspected EXAM: CT CHEST, ABDOMEN AND PELVIS WITHOUT CONTRAST TECHNIQUE: Multidetector CT imaging of the chest, abdomen and pelvis was performed following the standard  protocol without IV contrast. COMPARISON:  Radiograph earlier today. Chest CT 03/20/2021, chest abdomen pelvis CTA 08/16/2019 FINDINGS: CT CHEST FINDINGS Cardiovascular: Moderate atherosclerosis of the thoracic aorta. Aortic tortuosity. No periaortic stranding or inflammation. No aortic aneurysm. Cannot assess for dissection in the absence of IV contrast. Patient is post median sternotomy and CABG. Calcification of native coronary arteries. Heart is normal in size. There is no pericardial effusion. Mediastinum/Nodes: No enlarged mediastinal lymph nodes. There is no bulky hilar adenopathy on this unenhanced exam. Slightly patulous esophagus without wall thickening. Lungs/Pleura: Emphysema. Pleural thickening and calcification in the posterior aspect of the left lower lobe, stable in appearance from prior exam. No significant pleural effusion. Left lung base scarring and volume loss is unchanged. Stable calcified granuloma in the left lower lobe. Irregular nodule in superior segment of the right lower lobe measures 16 x 9 mm, series 6, image 55, previously 16 x 11 mm. There is surrounding interstitial thickening and extension to the pleural surface. Adjacent dependent airspace disease is called to this lesion is unchanged. Posterior perifissural right upper lobe airspace disease and septal thickening, not significantly changed, series 6, image 52. Pleural based left apical calcified nodule, series 6, image 30, not significantly changed. Left upper lobe nodule has increased in size, 9 x 5 mm, series 6, image 33, previously 5 mm. No definite new pulmonary nodule or airspace disease. No findings of pulmonary edema. Musculoskeletal: Exaggerated thoracic kyphosis. Thoracic compression fractures T4, T5, T6, and T7 are unchanged. Mild T8 superior endplate compression fracture with minimal loss of height, new. No evidence of focal bone lesion. Prior median sternotomy. CT ABDOMEN PELVIS FINDINGS Hepatobiliary: No evidence of  focal hepatic abnormality on this unenhanced exam, further limited by patient motion. Cholecystectomy. Dilated common bile duct at 11 mm, chronic and unchanged. Pancreas: The cystic lesion in the pancreatic head and prior imaging is not well defined on the current exam, motion artifact and arms down positioning obscures assessment. No ductal dilatation or inflammation. Spleen: Normal in size without focal abnormality. Adrenals/Urinary Tract: No adrenal nodule. Chronic right hydronephrosis  is unchanged from prior exam. Punctate nonobstructing stones in the lower right kidney. Slight thinning of right renal parenchyma. No obstructing lesion is seen. Nonobstructing stones in the lower left kidney. No left hydronephrosis. Small left renal cyst. Unremarkable urinary bladder. Stomach/Bowel: Lack of contrast, motion, and arms down positioning limits detailed bowel assessment. Decompressed stomach. No bowel obstruction or inflammation. Moderate stool in the colon. No abnormal rectal distention. Sigmoid colon is redundant. Vascular/Lymphatic: Advanced aortic atherosclerosis. No aortic aneurysm. There is no periaortic stranding. Cannot assess for dissection in the absence of IV contrast advanced bi-iliac atherosclerosis. No portal venous or mesenteric gas. There is no enlarged right inguinal node at 13 mm, series 3, image 104, increased in size from prior exam. Reproductive: Normal for age uterine atrophy.  No adnexal mass. Other: No free air or ascites. Musculoskeletal: Chronic L3 superior endplate compression fracture. Chronic bilateral L5 pars interarticularis defects. No acute osseous findings of the lumbar spine or pelvis. IMPRESSION: 1. Advanced thoracoabdominal aortic atherosclerosis. There is no periaortic stranding or aneurysmal dilatation to suggest acute aortic findings. Cannot assess for dissection on this unenhanced exam. 2. Decreased size of right lower lobe superior segment spiculated pulmonary nodule. Adjacent  presumed post radiation changes stable. 3. Increased size of left upper lobe pulmonary nodule, 9 x 5 mm, previously 5 mm. This is suspicious for progressive metastatic disease. 4. Unchanged posterior right upper lobe airspace disease, not significantly changed from prior CT, possibly radiation induced. 5. Chronic right hydronephrosis. Bilateral nonobstructing renal stones. 6. Enlarged right inguinal node measuring 13 mm, increased in size from prior exam. This is nonspecific. Recommend attention at follow-up. 7. Multiple chronic compression fractures of the thoracic and lumbar spine. Mild T8 superior endplate compression fracture with minimal loss of height, new from November 2022 exam. 8. Additional chronic findings as described. Aortic Atherosclerosis (ICD10-I70.0) and Emphysema (ICD10-J43.9). Electronically Signed   By: Keith Rake M.D.   On: 05/23/2021 20:48    Procedures Procedures    Medications Ordered in ED Medications  oxyCODONE (Oxy IR/ROXICODONE) immediate release tablet 5 mg (5 mg Oral Given 05/23/21 2056)    ED Course/ Medical Decision Making/ A&P                           Medical Decision Making 88 year old female with known severe vascular disease including coronary arteries, carotid, diffuse vascular disease, COPD, lung cancer on palliative care, presents today complaining of sudden severe onset of upper abdominal pain without associated nausea and vomiting.  Pain is worse with certain movement and with palpation.  Appears to be more musculoskeletal in nature.  Patient is hypertensive with known hypertension in the setting of not taking all of her medications today.  Her temperature is normal.  Heart rate is normal respiratory rate is normal and oxygen saturations are normal.  EKG with normal sinus rhythm and LVH essentially unchanged from prior Chest x-Delainy Mcelhiney is clear Patient with known and stable CKD with elevated CT done with advanced thoracoabdominal aortic atherosclerosis.   However were unable to give contrast and there is no definitive imaging to rule out dissection, but no stranding was noted or aneurysmal dilation Decreased size of spiculated nodule previously noted Increased size of left upper lobe pulmonary nodule Unchanged posterior right lower lobe airspace disease likely to be radiation-induced Large right inguinal node Multiple chronic compression fractures with multi T8 superior endplate compression fracture Given that the new T8 compression fracture would be consistent where patient is  having pain, also consistent with the type of pain she experienced which she described as having a sudden ripping sensation with movement Patient is treated here with oxycodone  Amount and/or Complexity of Data Reviewed External Data Reviewed: notes. Labs: ordered. Decision-making details documented in ED Course. Radiology: ordered and independent interpretation performed. Decision-making details documented in ED Course. ECG/medicine tests: ordered and independent interpretation performed. Decision-making details documented in ED Course.  Final Clinical Impression(s) / ED Diagnoses Final diagnoses:  Compression fracture of T8 vertebra, initial encounter ([place])  Epigastric pain    Rx / DC Orders ED Discharge Orders          Ordered    oxyCODONE (ROXICODONE) 5 MG immediate release tablet  Every 4 hours PRN        05/23/21 2258              Pattricia Boss, MD 05/23/21 2258

## 2021-05-28 ENCOUNTER — Ambulatory Visit (INDEPENDENT_AMBULATORY_CARE_PROVIDER_SITE_OTHER): Payer: Medicare Other | Admitting: Physician Assistant

## 2021-05-28 ENCOUNTER — Encounter: Payer: Self-pay | Admitting: Physician Assistant

## 2021-05-28 ENCOUNTER — Other Ambulatory Visit: Payer: Self-pay

## 2021-05-28 VITALS — BP 119/58 | HR 62 | Temp 98.3°F | Ht 64.0 in | Wt 114.0 lb

## 2021-05-28 DIAGNOSIS — S22060A Wedge compression fracture of T7-T8 vertebra, initial encounter for closed fracture: Secondary | ICD-10-CM | POA: Diagnosis not present

## 2021-05-28 DIAGNOSIS — M8580 Other specified disorders of bone density and structure, unspecified site: Secondary | ICD-10-CM | POA: Diagnosis not present

## 2021-05-28 NOTE — Patient Instructions (Addendum)
Thoracic Spine Fracture A thoracic spine fracture is a break in one of the bones of the middle part of the back. The fracture can be mild or very bad. The most serious types cause the broken bones to: Move out of place (unstable). Damage or press on the main nerve in the spine (spinal cord). In some cases, the bone that connects to the lower part of the back may also have a break (thoracolumbar fracture). What are the causes? This condition may be caused by: A car accident. A fall. A sports accident. Violent acts. These include assaults or gunshots. What are the signs or symptoms? Symptoms may include: Back pain. Trouble standing or walking. Numbness. Tingling. Weakness. Loss of movement. Being unable to control when to pee or poop (incontinence). How is this treated? Treatment may include: Medicines. A cast or a brace. Physical therapy. Surgery. This may be needed for very bad fractures. Follow these instructions at home: Medicines Take medicines only as told by your doctor. Do not drive or use heavy machinery while taking pain medicine. To prevent or treat trouble pooping (constipation) while you are taking prescription pain medicine, your doctor may recommend that you: Drink enough fluid to keep your pee (urine) pale yellow. Take over-the-counter or prescription medicines. Eat foods that are high in fiber. This includes fresh fruits and vegetables, whole grains, and beans. Limit foods that are high in fat and processed sugars. This includes fried or sweet foods. If you have a brace: Wear the back brace as told by your doctor. Remove it only as told by your doctor. Keep the brace clean. If the brace is not waterproof: Do not let it get wet. Cover it with a watertight covering when you take a bath or a shower. Activity Stay in bed (on bed rest) only as told by your doctor. Ask your doctor what is safe for you to do. Return to your normal activities as told by your  doctor. Do back exercises (physical therapy) as told by your doctor. Exercise often as told by your doctor. Managing pain, stiffness, and swelling  If told, put ice on the injured area: Put ice in a plastic bag. Place a towel between your skin and the bag. Leave the ice on for 20 minutes, 2-3 times a day. General instructions Do not use any products that contain nicotine or tobacco, such as cigarettes and e-cigarettes. If you need help quitting, ask your doctor. Do not drink alcohol. Keep all follow-up visits as told by your doctor. This is important. Contact a doctor if: You have a fever. You have a cough that makes your pain worse. Your pain medicine is not helping. Your pain does not get better over time. You cannot return to your normal activities as planned. Get help right away if: Your pain is bad and it suddenly gets worse. You are not able to move any part of your body (paralysis) that is below the level of your injury. You have numbness, tingling, or weakness in any part of your body that is below the level of your injury. You cannot control when you pee (urinate) or when you poop (pass stool). Summary A thoracic spine fracture is a break in one of the bones of the middle part of the back. A stable fracture can be treated with a back brace, activity restrictions, pain medicine, and physical therapy. A more severe fracture may require surgery. Make sure you know what symptoms should cause you to get help right away. This  information is not intended to replace advice given to you by your health care provider. Make sure you discuss any questions you have with your health care provider. Document Revised: 06/18/2017 Document Reviewed: 06/18/2017 Elsevier Patient Education  [place]. Spinal Compression Fracture A spinal compression fracture is a collapse of the bones that form the spine (vertebrae). With this type of fracture, the vertebrae become pushed (compressed) into  a wedge shape. Most compression fractures happen in the middle or lower part of the spine. What are the causes? This condition may be caused by: Thinning and loss of density in the bones (osteoporosis). This is the most common cause. A fall. A car or motorcycle accident. Cancer. Trauma, such as a heavy, direct hit to the head or back. What increases the risk? You are more likely to develop this condition if: You are 98 years of age or older. You have osteoporosis. You have certain types of cancer, including: Multiple myeloma. Lymphoma. Prostate cancer. Lung cancer. Breast cancer. What are the signs or symptoms? Symptoms of this condition include: Severe pain with simple movements such as coughing or sneezing. Pain that gets worse over time. Pain that is worse when you stand, walk, sit, or bend. Sudden pain that is so bad that it is hard for you to move. Bending or humping of the spine. Gradual loss of height. Numbness, tingling, or weakness in the back and legs. Trouble walking. Your symptoms will depend on the cause of the fracture and how quickly it develops. How is this diagnosed? This condition may be diagnosed based on symptoms, medical history, and a physical exam. During the physical exam, your health care provider may tap along the length of your spine to check for tenderness. Tests may be done to confirm the diagnosis. They may include: A bone mineral density test to check for osteoporosis. Imaging tests, such as a spine X-ray, CT scan, or MRI. How is this treated? Treatment depends on the cause and severity of the condition. Some fractures may heal on their own with supportive care. Treatment may include: Pain medicine. Rest. A back brace. Physical therapy exercises. Medicine to strengthen bone. Calcium and vitamin D supplements. Fractures that cause the back to become misshapen, cause nerve pain or weakness, or do not respond to other treatment may be treated with  surgery. This may include: Vertebroplasty. Bone cement is injected into the collapsed vertebrae to stabilize them. Balloon kyphoplasty. The collapsed vertebrae are expanded with a balloon and then bone cement is injected into them. Spinal fusion. The collapsed vertebrae are connected (fused) to normal vertebrae. Follow these instructions at home: Medicines Take over-the-counter and prescription medicines only as told by your health care provider. Ask your health care provider if the medicine prescribed to you: Requires you to avoid driving or using machinery. Can cause constipation. You may need to take these actions to prevent or treat constipation: Drink enough fluid to keep your urine pale yellow. Take over-the-counter or prescription medicines. Eat foods that are high in fiber, such as beans, whole grains, and fresh fruits and vegetables. Limit foods that are high in fat and processed sugars, such as fried or sweet foods. If you have a brace: Wear the brace as told by your health care provider. Remove it only as told by your health care provider. Loosen the brace if your fingers or toes tingle, become numb, or turn cold and blue. Keep the brace clean. If the brace is not waterproof: Do not let it get  wet. Cover it with a watertight covering when you take a bath or a shower. Managing pain, stiffness, and swelling  If directed, put ice on the injured area. To do this: If you have a removable brace, remove it as told by your health care provider. Put ice in a plastic bag. Place a towel between your skin and the bag. Leave the ice on for 20 minutes, 2-3 times a day. Remove the ice if your skin turns bright red. This is very important. If you cannot feel pain, heat, or cold, you have a greater risk of damage to the area. Activity Rest as told by your health care provider. Avoid sitting for a long time without moving. Get up to take short walks every 1-2 hours. This is important to  improve blood flow and breathing. Ask for help if you feel weak or unsteady. Return to your normal activities as told by your health care provider. Ask what activities are safe for you. Do physical therapy exercises to improve movement and strength in your back, as recommended by your health care provider. Exercise regularly as directed by your health care provider. General instructions  Do not drink alcohol. Alcohol can interfere with your treatment. Do not use any products that contain nicotine or tobacco, such as cigarettes, e-cigarettes, and chewing tobacco. These can delay bone healing. If you need help quitting, ask your health care provider. Keep all follow-up visits. This is important. It can help to prevent permanent injury, disability, and long-lasting (chronic) pain. Contact a health care provider if: You have a fever. Your pain medicine is not helping. Your pain does not get better over time. You cannot return to your normal activities as planned or expected. Get help right away if: Your pain is very bad and it suddenly gets worse. You are unable to move any body part (paralysis) that is below the level of your injury. You have numbness, tingling, or weakness in any body part that is below the level of your injury. You cannot control your bladder or bowels. Summary A spinal compression fracture is a collapse of the bones that form the spine (vertebrae). With this type of fracture, the vertebrae become pushed (compressed) into a wedge shape. Your symptoms and treatment will depend on the cause and severity of the fracture and how quickly it develops. Some fractures may heal on their own with supportive care. Fractures that cause the back to become misshapen, cause nerve pain or weakness, or do not respond to other treatment may be treated with surgery. This information is not intended to replace advice given to you by your health care provider. Make sure you discuss any questions  you have with your health care provider. Document Revised: 08/23/2019 Document Reviewed: 08/23/2019 Elsevier Patient Education  [place].

## 2021-05-28 NOTE — Progress Notes (Signed)
Established Patient Office Visit  Subjective:  Patient ID: [name], female    DOB: August 22, 1929  Age: 88 y.o. MRN: [id number]  CC:  Chief Complaint  Patient presents with   Follow-up    HPI [name] presents for ED follow up. Patient is accompanied by her son. Patient was evaluated for upper abdominal pain and imaging revealed compression fracture of T8 vertebrae. Patient reports continues to have pain. Tries to limit the use of oxycodone, usually takes one pill in the morning and sometimes at night. States is unable to lay on her back because of the pain which has interfered with her sleep since she cannot stay in a certain position for long. Patient reports several years ago was on Fosamax to help with improving bone mass.     Past Medical History:  Diagnosis Date   Allergic rhinitis    Aortic stenosis    Carotid artery disease (HCC)    Colon polyps    Congestive heart failure (CHF) ([place])    h/o Atrial fibrillation ([place]) 03/22/2011   Brief post op    Hematuria 11/05/2015   History of radiation therapy 05/02/20-05/15/20   SBRT to Right Lung - Dr. Sondra Come   Hypercalcemia    Hypertension    Hypothyroid    Lung cancer, lower lobe ([place]) dx'd 05/2020   xrt   Osteopenia    Oxygen dependent 11/05/2015   Pressure urticaria    RLS (restless legs syndrome)    S/P aortic valve replacement and aortoplasty 04/13/2011   43m Medtronic Freestyle porcine aortic root    S/P aortic valve replacement with stentless valve 03/18/2011    Past Surgical History:  Procedure Laterality Date   AORTIC ROOT REPLACEMENT  03/18/2011   290mMedtronic Freestyle Porcine aortic root with reimplantation of coronary arteries   BREAST LUMPECTOMY     Left   CATARACT EXTRACTION     CHOLECYSTECTOMY     FOOT SURGERY     Morton's neuroma   KNEE ARTHROSCOPY     Left    Family History  Problem Relation Age of Onset   Cancer Mother        bladder   Lung cancer Father    Heart disease Maternal  Grandmother    Emphysema Maternal Aunt    Asthma Maternal Aunt    Asthma Maternal Uncle    Breast cancer Sister    Uterine cancer Sister     Social History   Socioeconomic History   Marital status: Widowed    Spouse name: Not on file   Number of children: N   Years of education: Not on file   Highest education level: Not on file  Occupational History   Occupation: housewife  Tobacco Use   Smoking status: Former    Packs/day: 0.90    Years: 62.00    Pack years: 55.80    Types: Cigarettes    Quit date: 07/14/2010    Years since quitting: 10.8   Smokeless tobacco: Never  Vaping Use   Vaping Use: Never used  Substance and Sexual Activity   Alcohol use: No    Alcohol/week: 0.0 standard drinks   Drug use: No   Sexual activity: Never  Other Topics Concern   Not on file  Social History Narrative   Not on file   Social Determinants of Health   Financial Resource Strain: Not on file  Food Insecurity: No Food Insecurity   Worried About [place]  Last Year: Never true   Ran Out of Food in the Last Year: Never true  Transportation Needs: No Transportation Needs   Lack of Transportation (Medical): No   Lack of Transportation (Non-Medical): No  Physical Activity: Not on file  Stress: Not on file  Social Connections: Not on file  Intimate Partner Violence: Not on file    Outpatient Medications Prior to Visit  Medication Sig Dispense Refill   acetaminophen (TYLENOL) 500 MG tablet Take 500 mg by mouth every 6 (six) hours as needed. Take 3 tablets every 6 hours     albuterol (PROAIR HFA) 108 (90 Base) MCG/ACT inhaler Inhale 2 puffs into the lungs every 6 (six) hours as needed for wheezing or shortness of breath. 18 g 5   amLODipine (NORVASC) 5 MG tablet Take 0.5 tablets (2.5 mg total) by mouth daily. 90 tablet 3   apixaban (ELIQUIS) 2.5 MG TABS tablet Take 1 tablet (2.5 mg total) by mouth 2 (two) times daily. 180 tablet 3   Artificial Tear Ointment (DRY EYES  OP) Apply 1 drop to eye as needed (dryness).     levothyroxine (SYNTHROID) 112 MCG tablet Take 1 tablet by mouth once daily 90 tablet 1   Melatonin 10 MG TABS Take 10 mg by mouth at bedtime.     metoprolol succinate (TOPROL-XL) 25 MG 24 hr tablet Take 1 tablet (25 mg total) by mouth daily. 90 tablet 3   oxyCODONE (ROXICODONE) 5 MG immediate release tablet Take 1 tablet (5 mg total) by mouth every 4 (four) hours as needed for severe pain. 30 tablet 0   OXYGEN Inhale 4 L/min into the lungs See admin instructions. Inhale 2 L/min into the lungs continuously and 3 L/min with any exertion     potassium chloride (KLOR-CON) 10 MEQ tablet Take 1 tablet (10 mEq total) by mouth every Monday, Wednesday, and Friday. 45 tablet 3   predniSONE (DELTASONE) 20 MG tablet Take 1 tablet (20 mg total) by mouth daily with breakfast. 30 tablet 5   simvastatin (ZOCOR) 20 MG tablet TAKE 1 TABLET BY MOUTH AT BEDTIME 90 tablet 1   Tiotropium Bromide-Olodaterol (STIOLTO RESPIMAT) 2.5-2.5 MCG/ACT AERS INHALE 2 PUFFS BY MOUTH ONCE DAILY 12 g 4   torsemide (DEMADEX) 10 MG tablet Take 1 tablet (10 mg total) by mouth daily as needed (edema). 90 tablet 0   buPROPion (WELLBUTRIN) 75 MG tablet Take 1 tablet by mouth once daily x 1 week. Take 1 tablet by mouth twice daily. 180 tablet 0   hydrOXYzine (ATARAX/VISTARIL) 10 MG tablet TAKE 1 TABLET BY MOUTH AT BEDTIME AS NEEDED 30 tablet 0   terbinafine (LAMISIL) 250 MG tablet Take 0.5 tablets (125 mg total) by mouth daily. 45 tablet 0   No facility-administered medications prior to visit.    Allergies  Allergen Reactions   Clarithromycin Other (See Comments)    Made the mouth/throat sore   Tamsulosin Itching   Trazodone And Nefazodone Other (See Comments)    Dizziness    Penicillins Itching and Rash    Did it involve swelling of the face/tongue/throat, SOB, or low BP? No Did it involve sudden or severe rash/hives, skin peeling, or any reaction on the inside of your mouth or nose?  Yes Did you need to seek medical attention at a hospital or doctor's office? Yes When did it last happen?      Young woman  If all above answers are NO, may proceed with cephalosporin use.     ROS  Review of Systems Review of Systems:  A fourteen system review of systems was performed and found to be positive as per HPI.   Objective:    Physical Exam General:  Well Developed, well nourished, appropriate for stated age.  Neuro:  Alert and oriented,  extra-ocular muscles intact  HEENT:  Normocephalic, atraumatic, neck supple Skin:  no gross rash, warm, pink. Cardiac: RRR  Respiratory:  No wheezing, crackles, rhonchi or respiratory distress.  Vascular:  Ext warm, no cyanosis apprec.; cap RF less 2 sec. 1+ pitting edema b/l Psych:  No HI/SI, judgement and insight good, Euthymic mood. Full Affect.  BP (!) 119/58    Pulse 62    Temp 98.3 F (36.8 C)    Ht _0  (1.626 m)    Wt 114 lb (51.7 kg)    SpO2 91%    BMI 19.57 kg/m  Wt Readings from Last 3 Encounters:  05/28/21 114 lb (51.7 kg)  05/23/21 116 lb (52.6 kg)  03/24/21 113 lb (51.3 kg)     Health Maintenance Due  Topic Date Due   COVID-19 Vaccine (5 - Booster for Pfizer series) 11/25/2020    There are no preventive care reminders to display for this patient.  Lab Results  Component Value Date   TSH 3.510 02/17/2021   Lab Results  Component Value Date   WBC 10.9 (H) 05/23/2021   HGB 15.8 (H) 05/23/2021   HCT 48.6 (H) 05/23/2021   MCV 101.9 (H) 05/23/2021   PLT 137 (L) 05/23/2021   Lab Results  Component Value Date   NA 134 (L) 05/23/2021   K 4.8 05/23/2021   CO2 34 (H) 05/23/2021   GLUCOSE 135 (H) 05/23/2021   BUN 24 (H) 05/23/2021   CREATININE 1.76 (H) 05/23/2021   BILITOT 0.5 02/17/2021   ALKPHOS 51 02/17/2021   AST 24 02/17/2021   ALT 21 02/17/2021   PROT 6.4 02/17/2021   ALBUMIN 4.2 02/17/2021   CALCIUM 9.7 05/23/2021   ANIONGAP 6 05/23/2021   EGFR 36 (L) 02/17/2021   Lab Results  Component  Value Date   CHOL 151 03/06/2019   Lab Results  Component Value Date   HDL 81 03/06/2019   Lab Results  Component Value Date   LDLCALC 54 03/06/2019   Lab Results  Component Value Date   TRIG 85 03/06/2019   Lab Results  Component Value Date   CHOLHDL 1.9 03/06/2019   Lab Results  Component Value Date   HGBA1C 5.5 03/06/2019      Assessment & Plan:   Problem List Items Addressed This Visit       Musculoskeletal and Integument   Osteopenia (Chronic)   Relevant Orders   DG Bone Density   Other Visit Diagnoses     Compression fracture of T8 vertebra, initial encounter ([place])    -  Primary   Relevant Orders   DG Bone Density      Compression fracture of T8 vertebra, initial encounter: -Reviewed hospital notes, labs and/or imaging.  -Recommend to continue conservative therapy and pain control. Patient was provided rx for Oxycodone 5 mg immediate release to take every 4 hours as needed for severe pain with quantity of 30 tablets. Advised can take acetaminophen 325-650 mg as needed for mild-moderate pain relief. -Discussed with patient has multiple chronic compression fractures of the thoracic and lumbar spine which can be  associated with osteoporosis so recommend obtaining bone density scan for further evaluation. Patient verbalized understanding and agreeable. Last bone  density 03/24/2016: T score of -1.2 L femoral neck, osteopenia. Discussed with patient pending bone density results, if medical treatment indicated then recommend alternative for bisphosphonate due to CKD, Estimated Creatinine Clearance: 17.7 mL/min (A) (by C-G formula based on SCr of 1.76 mg/dL (H)). Such as Prolia.  -Chest XR: remote lower right rib fractures, no acute cardiopulmonary disease, COPD/chronic bronchitis with radiation induced right perihilar opacity.  -CT abdomen pelvis wo contrast: IMPRESSION: 1. Advanced thoracoabdominal aortic atherosclerosis. There is no periaortic stranding or  aneurysmal dilatation to suggest acute aortic findings. Cannot assess for dissection on this unenhanced exam. 2. Decreased size of right lower lobe superior segment spiculated pulmonary nodule. Adjacent presumed post radiation changes stable. 3. Increased size of left upper lobe pulmonary nodule, 9 x 5 mm, previously 5 mm. This is suspicious for progressive metastatic disease. 4. Unchanged posterior right upper lobe airspace disease, not significantly changed from prior CT, possibly radiation induced. 5. Chronic right hydronephrosis. Bilateral nonobstructing renal stones. 6. Enlarged right inguinal node measuring 13 mm, increased in size from prior exam. This is nonspecific. Recommend attention at follow-up. 7. Multiple chronic compression fractures of the thoracic and lumbar spine. Mild T8 superior endplate compression fracture with minimal loss of height, new from November 2022 exam.   No orders of the defined types were placed in this encounter.   Follow-up: Return if symptoms worsen or fail to improve.    [name], PA-C

## 2021-05-31 ENCOUNTER — Other Ambulatory Visit: Payer: Self-pay

## 2021-05-31 ENCOUNTER — Emergency Department (HOSPITAL_BASED_OUTPATIENT_CLINIC_OR_DEPARTMENT_OTHER): Payer: Medicare Other

## 2021-05-31 ENCOUNTER — Emergency Department (HOSPITAL_BASED_OUTPATIENT_CLINIC_OR_DEPARTMENT_OTHER)
Admission: EM | Admit: 2021-05-31 | Discharge: 2021-05-31 | Disposition: A | Discharge status: Home / Self Care | Payer: Medicare Other | Attending: Emergency Medicine | Admitting: Emergency Medicine

## 2021-05-31 ENCOUNTER — Encounter (HOSPITAL_BASED_OUTPATIENT_CLINIC_OR_DEPARTMENT_OTHER): Payer: Self-pay | Admitting: Emergency Medicine

## 2021-05-31 DIAGNOSIS — K5903 Drug induced constipation: Secondary | ICD-10-CM | POA: Insufficient documentation

## 2021-05-31 DIAGNOSIS — Z7901 Long term (current) use of anticoagulants: Secondary | ICD-10-CM | POA: Insufficient documentation

## 2021-05-31 DIAGNOSIS — I700 Atherosclerosis of aorta: Secondary | ICD-10-CM | POA: Diagnosis not present

## 2021-05-31 DIAGNOSIS — R109 Unspecified abdominal pain: Secondary | ICD-10-CM | POA: Diagnosis not present

## 2021-05-31 DIAGNOSIS — K862 Cyst of pancreas: Secondary | ICD-10-CM | POA: Diagnosis not present

## 2021-05-31 DIAGNOSIS — K5900 Constipation, unspecified: Secondary | ICD-10-CM | POA: Diagnosis present

## 2021-05-31 LAB — COMPREHENSIVE METABOLIC PANEL
ALT: 27 U/L (ref 0–44)
AST: 28 U/L (ref 15–41)
Albumin: 3.7 g/dL (ref 3.5–5.0)
Alkaline Phosphatase: 42 U/L (ref 38–126)
Anion gap: 8 (ref 5–15)
BUN: 27 mg/dL — ABNORMAL HIGH (ref 8–23)
CO2: 30 mmol/L (ref 22–32)
Calcium: 9.8 mg/dL (ref 8.9–10.3)
Chloride: 97 mmol/L — ABNORMAL LOW (ref 98–111)
Creatinine, Ser: 1.67 mg/dL — ABNORMAL HIGH (ref 0.44–1.00)
GFR, Estimated: 29 mL/min — ABNORMAL LOW (ref >60)
Glucose, Bld: 112 mg/dL — ABNORMAL HIGH (ref 70–99)
Potassium: 4.6 mmol/L (ref 3.5–5.1)
Sodium: 135 mmol/L (ref 135–145)
Total Bilirubin: 0.7 mg/dL (ref 0.3–1.2)
Total Protein: 6.5 g/dL (ref 6.5–8.1)

## 2021-05-31 LAB — CBC WITH DIFFERENTIAL/PLATELET
Abs Immature Granulocytes: 0.07 10*3/uL (ref 0.00–0.07)
Basophils Absolute: 0 10*3/uL (ref 0.0–0.1)
Basophils Relative: 0 %
Eosinophils Absolute: 0.1 10*3/uL (ref 0.0–0.5)
Eosinophils Relative: 1 %
HCT: 45.7 % (ref 36.0–46.0)
Hemoglobin: 15.1 g/dL — ABNORMAL HIGH (ref 12.0–15.0)
Immature Granulocytes: 1 %
Lymphocytes Relative: 14 %
Lymphs Abs: 1 10*3/uL (ref 0.7–4.0)
MCH: 32.5 pg (ref 26.0–34.0)
MCHC: 33 g/dL (ref 30.0–36.0)
MCV: 98.5 fL (ref 80.0–100.0)
Monocytes Absolute: 0.9 10*3/uL (ref 0.1–1.0)
Monocytes Relative: 12 %
Neutro Abs: 5.3 10*3/uL (ref 1.7–7.7)
Neutrophils Relative %: 72 %
Platelets: 178 10*3/uL (ref 150–400)
RBC: 4.64 MIL/uL (ref 3.87–5.11)
RDW: 13.1 % (ref 11.5–15.5)
WBC: 7.3 10*3/uL (ref 4.0–10.5)
nRBC: 0 % (ref 0.0–0.2)

## 2021-05-31 LAB — LIPASE, BLOOD: Lipase: 30 U/L (ref 11–51)

## 2021-05-31 MED ORDER — LACTULOSE 10 GM/15ML PO SOLN: 30.0000 g | Freq: Once | ORAL | Status: DC

## 2021-05-31 MED ORDER — DICYCLOMINE HCL 10 MG PO CAPS
10.0000 mg | ORAL_CAPSULE | Freq: Once | ORAL | Status: AC
  Administered 2021-05-31: 10 mg via ORAL
  Filled 2021-05-31: qty 1

## 2021-05-31 MED ORDER — OXYCODONE-ACETAMINOPHEN 5-325 MG PO TABS
1.0000 | ORAL_TABLET | Freq: Once | ORAL | Status: AC
  Administered 2021-05-31: 1 via ORAL
  Filled 2021-05-31: qty 1

## 2021-05-31 MED ORDER — LACTULOSE 10 GM/15ML PO SOLN: 20.0000 g | Freq: Two times a day (BID) | ORAL | 0 refills | Status: DC | PRN

## 2021-05-31 NOTE — ED Provider Notes (Signed)
[facility] EMERGENCY DEPARTMENT Provider Note   CSN: [id number] Arrival date & time: 05/31/21  1302     History  Chief Complaint  Patient presents with   Constipation    [name] is a 88 y.o. female.  Patient is a 88 year old female who presents with abdominal pain with concern for constipation.  She said that she was in the hospital recently diagnosed with a fracture of her back.  She has been taking oxycodone.  She says she has not had a bowel movement about 10 days.  She has some worsening pain in her abdomen.  It is generalized.  She has had some nausea but no vomiting.  No fevers.  No urinary symptoms.  She has tried Metamucil, Ex-Lax and suppositories without improvement in symptoms.      Home Medications Prior to Admission medications   Medication Sig Start Date End Date Taking? Authorizing Provider  acetaminophen (TYLENOL) 500 MG tablet Take 500 mg by mouth every 6 (six) hours as needed. Take 3 tablets every 6 hours    [provider]  albuterol (PROAIR HFA) 108 (90 Base) MCG/ACT inhaler Inhale 2 puffs into the lungs every 6 (six) hours as needed for wheezing or shortness of breath. 10/23/20   Collene Gobble, MD  amLODipine (NORVASC) 5 MG tablet Take 0.5 tablets (2.5 mg total) by mouth daily. 07/17/20 05/28/21  Imogene Burn, PA-C  apixaban (ELIQUIS) 2.5 MG TABS tablet Take 1 tablet (2.5 mg total) by mouth 2 (two) times daily. 06/15/20   [facility], Tami Lin, PA  Artificial Tear Ointment (DRY EYES OP) Apply 1 drop to eye as needed (dryness).    [provider]  levothyroxine (SYNTHROID) 112 MCG tablet Take 1 tablet by mouth once daily 05/15/21   [name], PA-C  Melatonin 10 MG TABS Take 10 mg by mouth at bedtime.    [provider]  metoprolol succinate (TOPROL-XL) 25 MG 24 hr tablet Take 1 tablet (25 mg total) by mouth daily. 06/16/20   [facility], Tami Lin, PA  oxyCODONE (ROXICODONE) 5 MG immediate release tablet Take 1 tablet (5  mg total) by mouth every 4 (four) hours as needed for severe pain. 05/23/21   Pattricia Boss, MD  OXYGEN Inhale 4 L/min into the lungs See admin instructions. Inhale 2 L/min into the lungs continuously and 3 L/min with any exertion    [provider]  potassium chloride (KLOR-CON) 10 MEQ tablet Take 1 tablet (10 mEq total) by mouth every Monday, Wednesday, and Friday. 10/04/20   Belva Crome, MD  predniSONE (DELTASONE) 20 MG tablet Take 1 tablet (20 mg total) by mouth daily with breakfast. 10/23/20   Collene Gobble, MD  simvastatin (ZOCOR) 20 MG tablet TAKE 1 TABLET BY MOUTH AT BEDTIME 04/14/21   Abonza, Maritza, PA-C  Tiotropium Bromide-Olodaterol (STIOLTO RESPIMAT) 2.5-2.5 MCG/ACT AERS INHALE 2 PUFFS BY MOUTH ONCE DAILY 04/23/21   Collene Gobble, MD  torsemide (DEMADEX) 10 MG tablet Take 1 tablet (10 mg total) by mouth daily as needed (edema). 02/18/21   [name], PA-C      Allergies    Clarithromycin, Tamsulosin, Trazodone and nefazodone, and Penicillins    Review of Systems   Review of Systems  Constitutional:  Negative for chills, diaphoresis, fatigue and fever.  HENT:  Negative for congestion, rhinorrhea and sneezing.   Eyes: Negative.   Respiratory:  Negative for cough, chest tightness and shortness of breath.   Cardiovascular:  Negative for chest  pain and leg swelling.  Gastrointestinal:  Positive for abdominal pain, constipation and nausea. Negative for blood in stool, diarrhea and vomiting.  Genitourinary:  Negative for difficulty urinating, flank pain, frequency and hematuria.  Musculoskeletal:  Negative for arthralgias and back pain.  Skin:  Negative for rash.  Neurological:  Negative for dizziness, speech difficulty, weakness, numbness and headaches.   Physical Exam Updated Vital Signs BP (!) 153/78    Pulse 62    Temp 97.8 F (36.6 C) (Oral)    Resp 12    Ht _0  (1.626 m)    Wt 51.7 kg    SpO2 100%    BMI 19.57 kg/m  Physical Exam Constitutional:       Appearance: She is well-developed.  HENT:     Head: Normocephalic and atraumatic.  Eyes:     Pupils: Pupils are equal, round, and reactive to light.  Cardiovascular:     Rate and Rhythm: Normal rate and regular rhythm.     Heart sounds: Normal heart sounds.  Pulmonary:     Effort: Pulmonary effort is normal. No respiratory distress.     Breath sounds: Normal breath sounds. No wheezing or rales.  Chest:     Chest wall: No tenderness.  Abdominal:     General: Bowel sounds are normal. There is distension.     Palpations: Abdomen is soft.     Tenderness: There is abdominal tenderness (Generalized tenderness). There is no guarding or rebound.  Genitourinary:    Comments: No impaction Musculoskeletal:        General: Normal range of motion.     Cervical back: Normal range of motion and neck supple.  Lymphadenopathy:     Cervical: No cervical adenopathy.  Skin:    General: Skin is warm and dry.     Findings: No rash.  Neurological:     Mental Status: She is alert and oriented to person, place, and time.    ED Results / Procedures / Treatments   Labs (all labs ordered are listed, but only abnormal results are displayed) Labs Reviewed  COMPREHENSIVE METABOLIC PANEL - Abnormal; Notable for the following components:      Result Value   Chloride 97 (*)    Glucose, Bld 112 (*)    BUN 27 (*)    Creatinine, Ser 1.67 (*)    GFR, Estimated 29 (*)    All other components within normal limits  CBC WITH DIFFERENTIAL/PLATELET - Abnormal; Notable for the following components:   Hemoglobin 15.1 (*)    All other components within normal limits  LIPASE, BLOOD    EKG None  Radiology CT Abdomen Pelvis Wo Contrast  Result Date: 05/31/2021 CLINICAL DATA:  Lower abdominal and left upper quadrant abdominal pain EXAM: CT ABDOMEN AND PELVIS WITHOUT CONTRAST TECHNIQUE: Multidetector CT imaging of the abdomen and pelvis was performed following the standard protocol without IV contrast. RADIATION  DOSE REDUCTION: This exam was performed according to the departmental dose-optimization program which includes automated exposure control, adjustment of the mA and/or kV according to patient size and/or use of iterative reconstruction technique. COMPARISON:  05/23/2021 CT chest, abdomen and pelvis FINDINGS: Lower chest: Coarsely calcified posterior left pleural plaques, chronic and unchanged. No acute abnormality at the lung bases. Coronary atherosclerosis. Hepatobiliary: Normal liver size. No liver mass. Stable small granulomatous/dystrophic coarse right liver calcification. Cholecystectomy. Bile ducts are similar and within normal post cholecystectomy limits with CBD diameter 9 mm. Pancreas: Pancreatic neck 1.7 cm cystic lesion (series 2/image  27), previously 1.7 cm, stable. No additional pancreatic lesions. Mild pancreatic duct dilation (4 mm diameter). Spleen: Normal size. No mass. Adrenals/Urinary Tract: Normal adrenals. Chronic mild-to-moderate right sided pelvocaliectasis with abrupt caliber transition at the right ureteropelvic junction, mildly decreased in dilatation in the interval. Several small nonobstructing right renal stones, largest 3 mm in the interpolar right kidney. No left hydronephrosis. Nonobstructing 4 mm lower left renal stone. Small simple bilateral renal cortical cysts, largest 1.1 cm in the anterior lower left kidney. Normal bladder. Stomach/Bowel: Normal non-distended stomach. Normal caliber small bowel with no small bowel wall thickening. Normal appendix. Moderate diffuse colonic stool. No large bowel wall thickening, significant diverticulosis or acute pericolonic fat stranding. Vascular/Lymphatic: Atherosclerotic nonaneurysmal abdominal aorta. No pathologically enlarged lymph nodes in the abdomen or pelvis. Reproductive: Grossly normal uterus.  No adnexal mass. Other: No pneumoperitoneum, ascites or focal fluid collection. Musculoskeletal: No aggressive appearing focal osseous lesions.  Marked thoracolumbar spondylosis. Chronic mild superior L3 vertebral compression fracture. Chronic bilateral L5 pars defects with chronic 9 mm anterolisthesis at L5-S1. IMPRESSION: 1. No acute abnormality. No evidence of bowel obstruction or acute bowel inflammation. 2. Moderate diffuse colonic stool, suggesting constipation. 3. Pancreatic neck 1.7 cm cystic lesion. Outpatient MRI abdomen without and with IV contrast may be considered as clinically warranted given patient age/comorbidities. 4. Bile ducts are similar and within normal post chronic cholecystectomy limits with CBD diameter 9 mm. 5. Chronic right UPJ obstruction. No acute urinary tract obstruction. Nonobstructing bilateral nephrolithiasis. 6. Chronic findings include: Coronary atherosclerosis. Chronic bilateral L5 pars defects with chronic 9 mm anterolisthesis at L5-S1. Aortic Atherosclerosis (ICD10-I70.0). Electronically Signed   By: Ilona Sorrel M.D.   On: 05/31/2021 14:32    Procedures Procedures    Medications Ordered in ED Medications  oxyCODONE-acetaminophen (PERCOCET/ROXICET) 5-325 MG per tablet 1 tablet (1 tablet Oral Given 05/31/21 1453)    ED Course/ Medical Decision Making/ A&P                          Patient is a 88 year old female who presents with abdominal pain and concern for constipation.  She does have some diffuse abdominal pain.  Given this and her age, CT scan was performed.  There is no acute abnormalities.  She does have some constipation and there is a cyst on her pancreas that is recommended for MRI.  Her labs are reviewed by me and are nonconcerning.  I reviewed her old records.  She is currently receiving treatment for lung cancer.  She is only getting palliative treatment at this point.  I discussed the findings of her pancreatic cyst.  She does not feel that this needs to be followed up on given that she already has active cancer and is in palliative treatment.  I do not disagree with this.  We will try soapsuds  enema. Dr. Melina Copa to take over care pending this treatment. Medical Decision Making  Final Clinical Impression(s) / ED Diagnoses Final diagnoses:  Drug-induced constipation  Pancreatic cyst    Rx / DC Orders ED Discharge Orders     None         Malvin Johns, MD 05/31/21 1507

## 2021-05-31 NOTE — ED Provider Notes (Signed)
Signout from Dr. Tamera Punt.  88 year old female here with constipation.  Plan is to follow-up on results from soapsuds enema. Physical Exam  BP (!) 183/81    Pulse 68    Temp 97.8 F (36.6 C) (Oral)    Resp (!) 21    Ht _0  (1.626 m)    Wt 51.7 kg    SpO2 99%    BMI 19.57 kg/m   Physical Exam  Procedures  Procedures  ED Course / MDM    Medical Decision Making  Patient had minimal improvement with soapsuds enema.  Given some Bentyl with improvement in her cramps.  Will discharge home on lactulose.  Recommended close follow-up with PCP       Hayden Rasmussen, MD 06/01/21 [phone]

## 2021-05-31 NOTE — ED Triage Notes (Signed)
Pt arrives pov c/o lower abdominal pain and LUQ pain, endorses constipation x 10 days, concern for bowel obstruction. Reports suppositories with no results or BM. Pt refusing EKG

## 2021-06-01 ENCOUNTER — Encounter (HOSPITAL_COMMUNITY): Payer: Self-pay

## 2021-06-01 ENCOUNTER — Emergency Department (HOSPITAL_COMMUNITY)
Admission: EM | Admit: 2021-06-01 | Discharge: 2021-06-01 | Disposition: A | Discharge status: Home / Self Care | Payer: Medicare Other | Attending: Emergency Medicine | Admitting: Emergency Medicine

## 2021-06-01 ENCOUNTER — Other Ambulatory Visit: Payer: Self-pay

## 2021-06-01 ENCOUNTER — Other Ambulatory Visit: Payer: Self-pay | Admitting: Physician Assistant

## 2021-06-01 DIAGNOSIS — K5903 Drug induced constipation: Secondary | ICD-10-CM

## 2021-06-01 DIAGNOSIS — I110 Hypertensive heart disease with heart failure: Secondary | ICD-10-CM | POA: Insufficient documentation

## 2021-06-01 DIAGNOSIS — I2510 Atherosclerotic heart disease of native coronary artery without angina pectoris: Secondary | ICD-10-CM | POA: Insufficient documentation

## 2021-06-01 DIAGNOSIS — R110 Nausea: Secondary | ICD-10-CM | POA: Insufficient documentation

## 2021-06-01 DIAGNOSIS — E039 Hypothyroidism, unspecified: Secondary | ICD-10-CM | POA: Diagnosis not present

## 2021-06-01 DIAGNOSIS — R109 Unspecified abdominal pain: Secondary | ICD-10-CM | POA: Diagnosis not present

## 2021-06-01 DIAGNOSIS — I509 Heart failure, unspecified: Secondary | ICD-10-CM | POA: Insufficient documentation

## 2021-06-01 DIAGNOSIS — Z87891 Personal history of nicotine dependence: Secondary | ICD-10-CM | POA: Diagnosis not present

## 2021-06-01 DIAGNOSIS — Z85118 Personal history of other malignant neoplasm of bronchus and lung: Secondary | ICD-10-CM | POA: Diagnosis not present

## 2021-06-01 DIAGNOSIS — K5900 Constipation, unspecified: Secondary | ICD-10-CM | POA: Diagnosis present

## 2021-06-01 MED ORDER — METOCLOPRAMIDE HCL 5 MG PO TABS: 5.0000 mg | ORAL_TABLET | Freq: Four times a day (QID) | ORAL | 0 refills | Status: AC

## 2021-06-01 MED ORDER — GOLYTELY 236 G PO SOLR: 4000.0000 mL | Freq: Once | ORAL | 0 refills | Status: AC

## 2021-06-01 MED ORDER — MILK AND MOLASSES ENEMA
1.0000 | Freq: Once | RECTAL | Status: AC
  Administered 2021-06-01: 240 mL via RECTAL
  Filled 2021-06-01: qty 240

## 2021-06-01 NOTE — ED Provider Notes (Signed)
[facility] Emergency Department Provider Note MRN:  [id number]  Arrival date & time: 06/01/21     Chief Complaint   Abdominal Pain   History of Present Illness   [name] is a 88 y.o. year-old female with a history of CAD, CHF, A. fib presenting to the ED with chief complaint of abdominal pain.  Patient has had no bowel movements in 9 days.  She was recently started on an opioid pain medication for a compression fracture in her back.  She is having worsening abdominal pain and distention.  Nausea but no vomiting.  She has been evaluated in the emergency department 2 times recently with CT scans.  Still no bowel movements despite multiple enemas, glycerin suppositories.  Here because the pain is not going away, cannot sleep, cannot function.  Review of Systems  A thorough review of systems was obtained and all systems are negative except as noted in the HPI and PMH.   Patient's Health History    Past Medical History:  Diagnosis Date   Allergic rhinitis    Aortic stenosis    Carotid artery disease (HCC)    Colon polyps    Congestive heart failure (CHF) ([place])    h/o Atrial fibrillation ([place]) 03/22/2011   Brief post op    Hematuria 11/05/2015   History of radiation therapy 05/02/20-05/15/20   SBRT to Right Lung - Dr. Sondra Come   Hypercalcemia    Hypertension    Hypothyroid    Lung cancer, lower lobe ([place]) dx'd 05/2020   xrt   Osteopenia    Oxygen dependent 11/05/2015   Pressure urticaria    RLS (restless legs syndrome)    S/P aortic valve replacement and aortoplasty 04/13/2011   77m Medtronic Freestyle porcine aortic root    S/P aortic valve replacement with stentless valve 03/18/2011    Past Surgical History:  Procedure Laterality Date   AORTIC ROOT REPLACEMENT  03/18/2011   239mMedtronic Freestyle Porcine aortic root with reimplantation of coronary arteries   BREAST LUMPECTOMY     Left   CATARACT EXTRACTION     CHOLECYSTECTOMY     FOOT  SURGERY     Morton's neuroma   KNEE ARTHROSCOPY     Left    Family History  Problem Relation Age of Onset   Cancer Mother        bladder   Lung cancer Father    Heart disease Maternal Grandmother    Emphysema Maternal Aunt    Asthma Maternal Aunt    Asthma Maternal Uncle    Breast cancer Sister    Uterine cancer Sister     Social History   Socioeconomic History   Marital status: Widowed    Spouse name: Not on file   Number of children: N   Years of education: Not on file   Highest education level: Not on file  Occupational History   Occupation: housewife  Tobacco Use   Smoking status: Former    Packs/day: 0.90    Years: 62.00    Pack years: 55.80    Types: Cigarettes    Quit date: 07/14/2010    Years since quitting: 10.8   Smokeless tobacco: Never  Vaping Use   Vaping Use: Never used  Substance and Sexual Activity   Alcohol use: No    Alcohol/week: 0.0 standard drinks   Drug use: No   Sexual activity: Never  Other Topics Concern   Not on file  Social History Narrative  Not on file   Social Determinants of Health   Financial Resource Strain: Not on file  Food Insecurity: No Food Insecurity   Worried About [place] in the Last Year: Never true   Ran Out of Food in the Last Year: Never true  Transportation Needs: No Transportation Needs   Lack of Transportation (Medical): No   Lack of Transportation (Non-Medical): No  Physical Activity: Not on file  Stress: Not on file  Social Connections: Not on file  Intimate Partner Violence: Not on file     Physical Exam   Vitals:   06/01/21 0130 06/01/21 0300  BP: (!) 181/90 (!) 182/102  Pulse: 71 85  Resp: 18 15  Temp:    SpO2: 98% 98%    CONSTITUTIONAL: Well-appearing, NAD NEURO/PSYCH:  Alert and oriented x 3, no focal deficits EYES:  eyes equal and reactive ENT/NECK:  no LAD, no JVD CARDIO: Regular rate, well-perfused, normal S1 and S2 PULM:  CTAB no wheezing or rhonchi GI/GU: Generalized  tenderness, moderate distention MSK/SPINE:  No gross deformities, no edema SKIN:  no rash, atraumatic   *Additional and/or pertinent findings included in MDM below  Diagnostic and Interventional Summary    EKG Interpretation  Date/Time:    Ventricular Rate:    PR Interval:    QRS Duration:   QT Interval:    QTC Calculation:   R Axis:     Text Interpretation:         Labs Reviewed - No data to display  No orders to display    Medications  milk and molasses enema (240 mLs Rectal Given 06/01/21 0143)     Procedures  /  Critical Care Procedures  ED Course and Medical Decision Making  Initial Impression and Ddx Constipation likely triggered by recent opioid prescription, significant distention on exam.  CT scan today did not show any perforation or obstruction.  We will perform repeat rectal exam to exclude signs of fecal impaction.  Will continue enema attempts.  If unsuccessful, patient may need admission.  Past medical/surgical history that increases complexity of ED encounter: Advanced age, anticoagulated status  Interpretation of Diagnostics Not applicable  Patient Reassessment and Ultimate Disposition/Management Rectal exam is without evidence of impaction.  Milk and molasses enema is successful, large bowel movement per nursing staff.  Patient is feeling better.  Will discharge on GoLytely to further clear out her bowels.  Patient management required discussion with the following services or consulting groups:  None  Complexity of Problems Addressed Acute complicated illness or Injury  Additional Data Reviewed and Analyzed Further history obtained from: Further history from spouse/family member  Factors Impacting ED Encounter Risk Prescriptions and Consideration of hospitalization  Barth Kirks. [place] Small, [place] [email or web address]  Final Clinical Impressions(s) / ED Diagnoses     ICD-10-CM   1.  Drug-induced constipation  K59.03       ED Discharge Orders          Ordered    polyethylene glycol (GOLYTELY) 236 g solution   Once        06/01/21 0324             Discharge Instructions Discussed with and Provided to Patient:     Discharge Instructions      You were evaluated in the Emergency Department and after careful evaluation, we did not find any emergent condition requiring admission or further testing in the hospital.  Your exam/testing today is  overall reassuring.  Recommend use of GoLytely prescription at home tomorrow to further clear out your bowels.  Can continue enemas, suppositories as needed.  Please return to the Emergency Department if you experience any worsening of your condition.   Thank you for allowing [place] to be a part of your care.       Maudie Flakes, MD 06/01/21 [phone]

## 2021-06-01 NOTE — ED Triage Notes (Signed)
Patient presents with abdominal pain. Patient taking oxycodone for her pain for fractured vertebrae last week. Patient seen today at [facility] and got a soap sud enema but still having abdominal pain.

## 2021-06-01 NOTE — Discharge Instructions (Signed)
You were evaluated in the Emergency Department and after careful evaluation, we did not find any emergent condition requiring admission or further testing in the hospital.  Your exam/testing today is overall reassuring.  Recommend use of GoLytely prescription at home tomorrow to further clear out your bowels.  Can continue enemas, suppositories as needed.  Please return to the Emergency Department if you experience any worsening of your condition.   Thank you for allowing [place] to be a part of your care.

## 2021-06-02 ENCOUNTER — Encounter: Payer: Self-pay | Admitting: Physician Assistant

## 2021-06-04 ENCOUNTER — Ambulatory Visit (INDEPENDENT_AMBULATORY_CARE_PROVIDER_SITE_OTHER): Payer: Medicare Other | Admitting: Physician Assistant

## 2021-06-04 ENCOUNTER — Encounter: Payer: Self-pay | Admitting: Physician Assistant

## 2021-06-04 ENCOUNTER — Other Ambulatory Visit: Payer: Self-pay

## 2021-06-04 VITALS — BP 118/65 | HR 68 | Temp 97.7°F | Ht 64.0 in | Wt 118.0 lb

## 2021-06-04 DIAGNOSIS — R109 Unspecified abdominal pain: Secondary | ICD-10-CM | POA: Diagnosis not present

## 2021-06-04 DIAGNOSIS — C3431 Malignant neoplasm of lower lobe, right bronchus or lung: Secondary | ICD-10-CM

## 2021-06-04 DIAGNOSIS — R600 Localized edema: Secondary | ICD-10-CM

## 2021-06-04 DIAGNOSIS — K5903 Drug induced constipation: Secondary | ICD-10-CM | POA: Diagnosis not present

## 2021-06-04 DIAGNOSIS — S22060A Wedge compression fracture of T7-T8 vertebra, initial encounter for closed fracture: Secondary | ICD-10-CM

## 2021-06-04 DIAGNOSIS — S22060D Wedge compression fracture of T7-T8 vertebra, subsequent encounter for fracture with routine healing: Secondary | ICD-10-CM

## 2021-06-04 NOTE — Progress Notes (Signed)
Acute Office Visit  Subjective:    Patient ID: [name], female    DOB: December 03, 1929, 88 y.o.   MRN: [id number]  Chief Complaint  Patient presents with   Acute Visit    Abdominal Pain, Mental confusion     HPI Patient is in today for c/o back pain and abdominal distension. Patient is accompanied by her son. Patient has been experiencing constipation secondary to pain medication which she was started on for compression fracture. Patient's son reports patient has had several bowel movements which have been loose stools. Patient reports has not had a solid stool. Patient was prescribed Golytely at last ED visit which she wasn't able to completely finish yet and has been taking 1 Miralax packet per day. States her abdomen continues to be distended. Also reports lower abdominal discomfort. Patient's son states she has not been eating and has been trying to make sure she has one protein drink per day. Bending over helps ease her pain.  Past Medical History:  Diagnosis Date   Allergic rhinitis    Aortic stenosis    Carotid artery disease (HCC)    Colon polyps    Congestive heart failure (CHF) ([place])    h/o Atrial fibrillation ([place]) 03/22/2011   Brief post op    Hematuria 11/05/2015   History of radiation therapy 05/02/20-05/15/20   SBRT to Right Lung - Dr. Sondra Come   Hypercalcemia    Hypertension    Hypothyroid    Lung cancer, lower lobe ([place]) dx'd 05/2020   xrt   Osteopenia    Oxygen dependent 11/05/2015   Pressure urticaria    RLS (restless legs syndrome)    S/P aortic valve replacement and aortoplasty 04/13/2011   68m Medtronic Freestyle porcine aortic root    S/P aortic valve replacement with stentless valve 03/18/2011    Past Surgical History:  Procedure Laterality Date   AORTIC ROOT REPLACEMENT  03/18/2011   260mMedtronic Freestyle Porcine aortic root with reimplantation of coronary arteries   BREAST LUMPECTOMY     Left   CATARACT EXTRACTION     CHOLECYSTECTOMY      FOOT SURGERY     Morton's neuroma   KNEE ARTHROSCOPY     Left    Family History  Problem Relation Age of Onset   Cancer Mother        bladder   Lung cancer Father    Heart disease Maternal Grandmother    Emphysema Maternal Aunt    Asthma Maternal Aunt    Asthma Maternal Uncle    Breast cancer Sister    Uterine cancer Sister     Social History   Socioeconomic History   Marital status: Widowed    Spouse name: Not on file   Number of children: N   Years of education: Not on file   Highest education level: Not on file  Occupational History   Occupation: housewife  Tobacco Use   Smoking status: Former    Packs/day: 0.90    Years: 62.00    Pack years: 55.80    Types: Cigarettes    Quit date: 07/14/2010    Years since quitting: 10.9   Smokeless tobacco: Never  Vaping Use   Vaping Use: Never used  Substance and Sexual Activity   Alcohol use: No    Alcohol/week: 0.0 standard drinks   Drug use: No   Sexual activity: Never  Other Topics Concern   Not on file  Social History Narrative   Not on  file   Social Determinants of Health   Financial Resource Strain: Not on file  Food Insecurity: No Food Insecurity   Worried About Charity fundraiser in the Last Year: Never true   Ran Out of Food in the Last Year: Never true  Transportation Needs: No Transportation Needs   Lack of Transportation (Medical): No   Lack of Transportation (Non-Medical): No  Physical Activity: Not on file  Stress: Not on file  Social Connections: Not on file  Intimate Partner Violence: Not on file    Outpatient Medications Prior to Visit  Medication Sig Dispense Refill   acetaminophen (TYLENOL) 500 MG tablet Take 500 mg by mouth every 6 (six) hours as needed. Take 3 tablets every 6 hours     albuterol (PROAIR HFA) 108 (90 Base) MCG/ACT inhaler Inhale 2 puffs into the lungs every 6 (six) hours as needed for wheezing or shortness of breath. 18 g 5   amLODipine (NORVASC) 5 MG tablet Take 0.5  tablets (2.5 mg total) by mouth daily. 90 tablet 3   apixaban (ELIQUIS) 2.5 MG TABS tablet Take 1 tablet (2.5 mg total) by mouth 2 (two) times daily. 180 tablet 3   Artificial Tear Ointment (DRY EYES OP) Apply 1 drop to eye as needed (dryness).     levothyroxine (SYNTHROID) 112 MCG tablet Take 1 tablet by mouth once daily 90 tablet 1   Melatonin 10 MG TABS Take 10 mg by mouth at bedtime.     metoCLOPramide (REGLAN) 5 MG tablet Take 1 tablet (5 mg total) by mouth 4 (four) times daily. 30 tablet 0   metoprolol succinate (TOPROL-XL) 25 MG 24 hr tablet Take 1 tablet (25 mg total) by mouth daily. 90 tablet 3   oxyCODONE (ROXICODONE) 5 MG immediate release tablet Take 1 tablet (5 mg total) by mouth every 4 (four) hours as needed for severe pain. 30 tablet 0   OXYGEN Inhale 4 L/min into the lungs See admin instructions. Inhale 2 L/min into the lungs continuously and 3 L/min with any exertion     potassium chloride (KLOR-CON) 10 MEQ tablet Take 1 tablet (10 mEq total) by mouth every Monday, Wednesday, and Friday. 45 tablet 3   predniSONE (DELTASONE) 20 MG tablet Take 1 tablet (20 mg total) by mouth daily with breakfast. 30 tablet 5   simvastatin (ZOCOR) 20 MG tablet TAKE 1 TABLET BY MOUTH AT BEDTIME 90 tablet 1   Tiotropium Bromide-Olodaterol (STIOLTO RESPIMAT) 2.5-2.5 MCG/ACT AERS INHALE 2 PUFFS BY MOUTH ONCE DAILY 12 g 4   torsemide (DEMADEX) 10 MG tablet Take 1 tablet (10 mg total) by mouth daily as needed (edema). 90 tablet 0   No facility-administered medications prior to visit.    Allergies  Allergen Reactions   Clarithromycin Other (See Comments)    Made the mouth/throat sore   Tamsulosin Itching   Trazodone And Nefazodone Other (See Comments)    Dizziness    Penicillins Itching and Rash    Did it involve swelling of the face/tongue/throat, SOB, or low BP? No Did it involve sudden or severe rash/hives, skin peeling, or any reaction on the inside of your mouth or nose? Yes Did you need to  seek medical attention at a hospital or doctor's office? Yes When did it last happen?      Young woman  If all above answers are NO, may proceed with cephalosporin use.     Review of Systems Review of Systems:  A fourteen system review of systems  was performed and found to be positive as per HPI.    Objective:    Physical Exam General:  Cooperative, ill-appearing Neuro:  Alert and oriented,  extra-ocular muscles intact  HEENT:  Normocephalic, atraumatic, neck supple Skin:  no gross rash, warm, pink. Abdomen: +distension, +tenderness lower abdomen more right sided, +BS Cardiac:  RRR Respiratory:  Dec breath sounds, no wheezing, crackles or rales. Using portable oxygen. Vascular:  Ext warm, no cyanosis apprec.; cap RF less 2 sec. 2+ pitting edema bilaterally  Psych:  No HI/SI, judgement and insight good, Euthymic mood. Full Affect.  BP 118/65    Pulse 68    Temp 97.7 F (36.5 C)    Ht _0  (1.626 m)    Wt 118 lb (53.5 kg)    SpO2 94%    BMI 20.25 kg/m  Wt Readings from Last 3 Encounters:  06/04/21 118 lb (53.5 kg)  05/31/21 114 lb (51.7 kg)  05/28/21 114 lb (51.7 kg)    There are no preventive care reminders to display for this patient.   There are no preventive care reminders to display for this patient.   Lab Results  Component Value Date   TSH 3.510 02/17/2021   Lab Results  Component Value Date   WBC 8.1 06/04/2021   HGB 15.9 06/04/2021   HCT 45.6 06/04/2021   MCV 95 06/04/2021   PLT 194 06/04/2021   Lab Results  Component Value Date   NA 136 06/04/2021   K CANCELED 06/04/2021   CO2 23 06/04/2021   GLUCOSE CANCELED 06/04/2021   BUN 29 (H) 06/04/2021   CREATININE 1.93 (H) 06/04/2021   BILITOT 0.3 06/04/2021   ALKPHOS 61 06/04/2021   AST 43 (H) 06/04/2021   ALT 28 06/04/2021   PROT 6.3 06/04/2021   ALBUMIN 3.9 06/04/2021   CALCIUM 9.3 06/04/2021   ANIONGAP 8 05/31/2021   EGFR 24 (L) 06/04/2021   Lab Results  Component Value Date   CHOL 151  03/06/2019   Lab Results  Component Value Date   HDL 81 03/06/2019   Lab Results  Component Value Date   LDLCALC 54 03/06/2019   Lab Results  Component Value Date   TRIG 85 03/06/2019   Lab Results  Component Value Date   CHOLHDL 1.9 03/06/2019   Lab Results  Component Value Date   HGBA1C 5.5 03/06/2019       Assessment & Plan:   Problem List Items Addressed This Visit   None Visit Diagnoses     Drug-induced constipation    -  Primary   Compression fracture of T8 vertebra, initial encounter ([place])       Abdominal pain, unspecified abdominal location       Relevant Orders   CBC w/Diff (Completed)   Comp Met (CMET) (Completed)   Lipase (Completed)   Lower extremity edema       Relevant Orders   B Nat Peptide (Completed)      Malignant neoplasm of lower lobe of right lung: -CT chest abdomen pelvis wo contrast 05/23/2021 revealed increased size of left upper lobe pulmonary nodule, 9 x 5 mm, previously 5 mm which is suspicious for progressive metastatic disease. -Patient is followed by pulmonology and radiation/oncology.  -Discussed with patient and patient's son to consider palliative care.  Drug-induced constipation, Abdominal pain: -Advised to continue with Miralax and recommend increasing to 2 packets daily until bowels become regular and soft then can resume one packet daily if still taking pain medication. Discussed  high fiber diet.  -CT abdomen pelvis wo contrast 05/31/2021 reveals no evidence of obstruction or acute bowel inflammation, moderate diffuse colonic stool, pancreatic neck 1.7 cm cystic lesion, chronic right UPJ obstruction without acute urinary tract obstruction. Patient and son deferred additional imaging of pancreatic cystic lesion with MRI. Abdominal pain possibly multi-factorial from constipation and referred pain from compression fracture. Will collect labs to evaluate renal function and signs of pancreatitis.   Compression fracture of T8  vertebra, subsequent encounter: -Due to patient's multiple co-morbidities and current health status is not an ideal candidate for surgical intervention and recommend to continue with conservative therapy. Advised will provide refill of pain medication if needed. Continue to limit use of Oxycodone for severe pain and take Tylenol for mild-moderate pain.   Lower extremity edema: -Patient has significant swelling on exam today. Advised to take an extra dose of Torsemide 10 mg and possibly another one tomorrow depending how her swelling improves. Will collect BNP to evaluate for acute exacerbation of CHF.    No orders of the defined types were placed in this encounter.    [name], PA-C

## 2021-06-04 NOTE — Patient Instructions (Signed)

## 2021-06-05 ENCOUNTER — Telehealth: Payer: Self-pay | Admitting: Physician Assistant

## 2021-06-05 ENCOUNTER — Encounter: Payer: Self-pay | Admitting: Physician Assistant

## 2021-06-05 DIAGNOSIS — C3431 Malignant neoplasm of lower lobe, right bronchus or lung: Secondary | ICD-10-CM

## 2021-06-05 LAB — CBC WITH DIFFERENTIAL/PLATELET
Basophils Absolute: 0 10*3/uL (ref 0.0–0.2)
Basos: 0 %
EOS (ABSOLUTE): 0 10*3/uL (ref 0.0–0.4)
Eos: 0 %
Hematocrit: 45.6 % (ref 34.0–46.6)
Hemoglobin: 15.9 g/dL (ref 11.1–15.9)
Immature Grans (Abs): 0.1 10*3/uL (ref 0.0–0.1)
Immature Granulocytes: 1 %
Lymphocytes Absolute: 0.5 10*3/uL — ABNORMAL LOW (ref 0.7–3.1)
Lymphs: 6 %
MCH: 33.1 pg — ABNORMAL HIGH (ref 26.6–33.0)
MCHC: 34.9 g/dL (ref 31.5–35.7)
MCV: 95 fL (ref 79–97)
Monocytes Absolute: 0.3 10*3/uL (ref 0.1–0.9)
Monocytes: 4 %
Neutrophils Absolute: 7.3 10*3/uL — ABNORMAL HIGH (ref 1.4–7.0)
Neutrophils: 89 %
Platelets: 194 10*3/uL (ref 150–450)
RBC: 4.8 x10E6/uL (ref 3.77–5.28)
RDW: 12.2 % (ref 11.7–15.4)
WBC: 8.1 10*3/uL (ref 3.4–10.8)

## 2021-06-05 LAB — COMPREHENSIVE METABOLIC PANEL
ALT: 28 IU/L (ref 0–32)
AST: 43 IU/L — ABNORMAL HIGH (ref 0–40)
Albumin/Globulin Ratio: 1.6 (ref 1.2–2.2)
Albumin: 3.9 g/dL (ref 3.6–4.6)
Alkaline Phosphatase: 61 IU/L (ref 44–121)
BUN/Creatinine Ratio: 15 (ref 12–28)
BUN: 29 mg/dL — ABNORMAL HIGH (ref 8–27)
Bilirubin Total: 0.3 mg/dL (ref 0.0–1.2)
CO2: 23 mmol/L (ref 20–29)
Calcium: 9.3 mg/dL (ref 8.7–10.3)
Chloride: 92 mmol/L — ABNORMAL LOW (ref 96–106)
Creatinine, Ser: 1.93 mg/dL — ABNORMAL HIGH (ref 0.57–1.00)
Globulin, Total: 2.4 g/dL (ref 1.5–4.5)
Sodium: 136 mmol/L (ref 134–144)
Total Protein: 6.3 g/dL (ref 6.0–8.5)
eGFR: 24 mL/min/{1.73_m2} — ABNORMAL LOW (ref >59)

## 2021-06-05 LAB — BRAIN NATRIURETIC PEPTIDE: BNP: 456.6 pg/mL — ABNORMAL HIGH (ref 0.0–100.0)

## 2021-06-05 LAB — LIPASE: Lipase: 31 U/L (ref 14–85)

## 2021-06-05 MED ORDER — OXYCODONE HCL 5 MG PO TABS: 5.0000 mg | ORAL_TABLET | ORAL | 0 refills | Status: AC | PRN

## 2021-06-05 NOTE — Telephone Encounter (Signed)
Patient's granddaughter called and stated she([name]) needs a refill of her oxycodone. Please advise. [phone]

## 2021-06-09 ENCOUNTER — Other Ambulatory Visit: Payer: Self-pay | Admitting: Physician Assistant

## 2021-06-09 ENCOUNTER — Other Ambulatory Visit: Payer: Self-pay

## 2021-06-09 ENCOUNTER — Encounter: Payer: Self-pay | Admitting: Physician Assistant

## 2021-06-09 DIAGNOSIS — R600 Localized edema: Secondary | ICD-10-CM

## 2021-06-09 MED ORDER — METOPROLOL SUCCINATE ER 25 MG PO TB24: 25.0000 mg | ORAL_TABLET | Freq: Every day | ORAL | 0 refills | Status: AC

## 2021-06-09 MED ORDER — TORSEMIDE 10 MG PO TABS: 10.0000 mg | ORAL_TABLET | Freq: Two times a day (BID) | ORAL | 1 refills | Status: AC | PRN

## 2021-06-09 MED ORDER — APIXABAN 2.5 MG PO TABS: 2.5000 mg | ORAL_TABLET | Freq: Two times a day (BID) | ORAL | 0 refills | Status: AC

## 2021-06-09 NOTE — Telephone Encounter (Signed)
Kenney Houseman called stating that patients Torsemide 62m has been changed to BID and is requesting a refill.   Also, wants to know if patient should continue to take Reglan. She is currently having very loose stools. She having trouble holding her bowels.   Also requesting FMLA forms to be completed. AS, CMA

## 2021-06-09 NOTE — Progress Notes (Signed)
Error

## 2021-06-09 NOTE — Telephone Encounter (Signed)
Please send refill. Computer will not let me sent that medication?   The FMLA paperwork was put in CMA basket this morning.   Tonya aware of the below. AS, CMA

## 2021-06-09 NOTE — Addendum Note (Signed)
Addended by: Mickel Crow on: 06/09/2021 01:23 PM   Modules accepted: Orders

## 2021-06-29 ENCOUNTER — Encounter: Payer: Self-pay | Admitting: Emergency Medicine

## 2021-06-29 ENCOUNTER — Encounter: Payer: Self-pay | Admitting: Radiation Oncology

## 2021-06-30 NOTE — Telephone Encounter (Signed)
This is an FYI from the patient family to provider.

## 2021-07-01 NOTE — Telephone Encounter (Signed)
We should send a condolences card. Thanks.

## 2021-07-03 NOTE — Telephone Encounter (Signed)
Condolence card given to [place] and [place].

## 2021-07-14 ENCOUNTER — Ambulatory Visit: Payer: Medicare Other | Admitting: *Deleted

## 2021-07-16 DEATH — deceased

## 2021-08-07 IMAGING — CT CT HEAD W/O CM
3 of 4 series · 16 of 47 positions shown, 19 images · non-contrast
Comparison: None

CLINICAL DATA: Headache headaches for several months especially
waking her at night

EXAM:
CT HEAD WITHOUT CONTRAST
TECHNIQUE: Contiguous axial images were obtained from the base of the skull
through the vertex without intravenous contrast. Sagittal and
coronal MPR images reconstructed from axial data set.

[Series 2: head 5.00 hr40 s3 axial ibhc · axial · 0.41mm/px · z∈[-578,-443]mm · 10 of 33 slices shown, 13 images]
[im 3/33  brain]
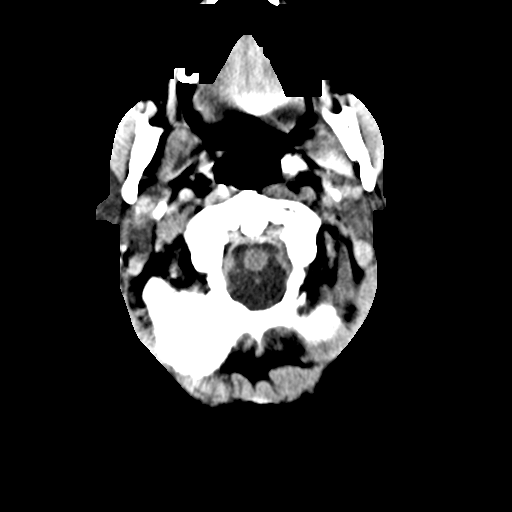
[im 3/33  bone]
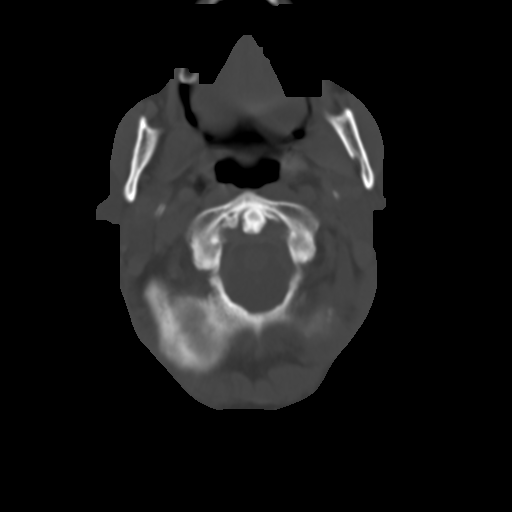
[im 5/33  brain]
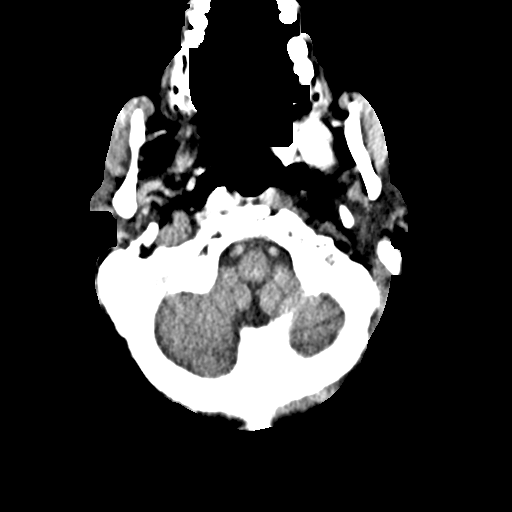
[im 10/33  brain]
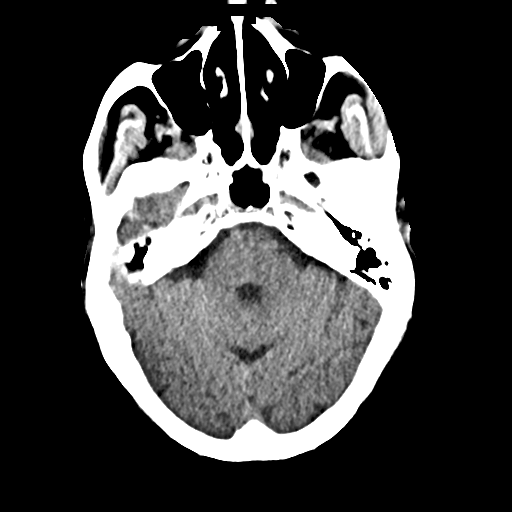
[im 12/33  brain]
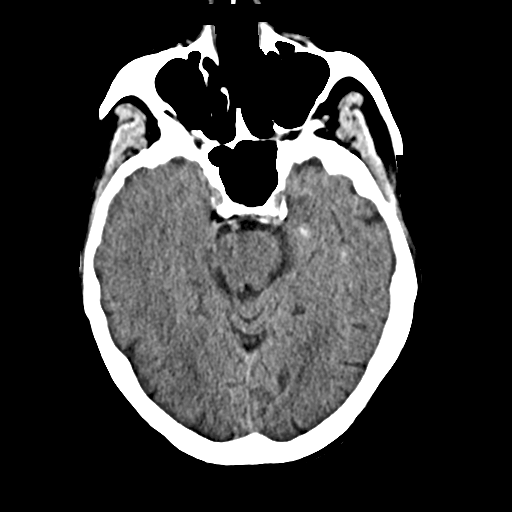
[im 14/33  brain]
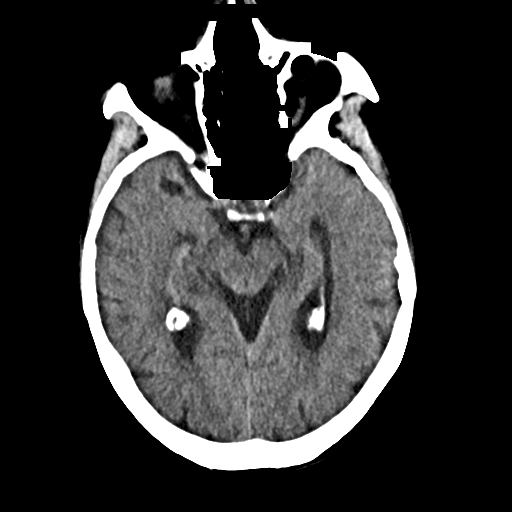
[im 14/33  bone]
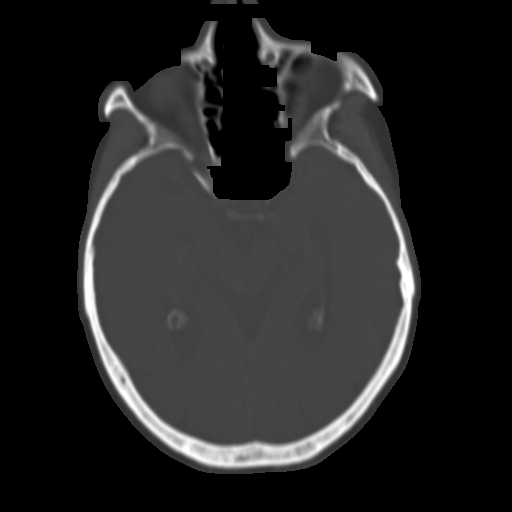
[im 19/33  brain]
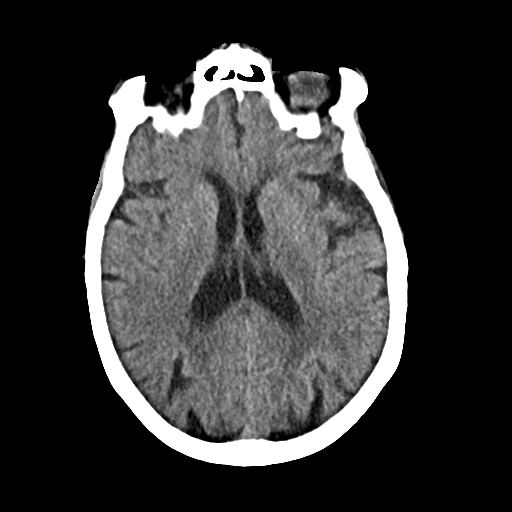
[im 21/33  brain]
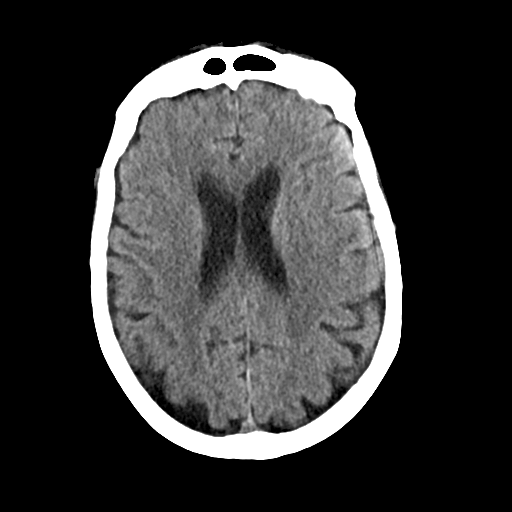
[im 23/33  brain]
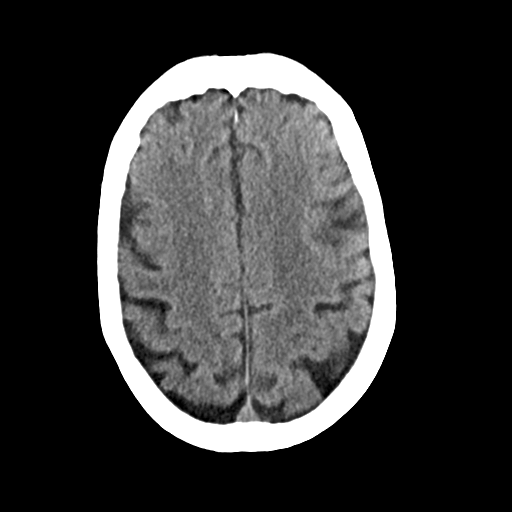
[im 28/33  brain]
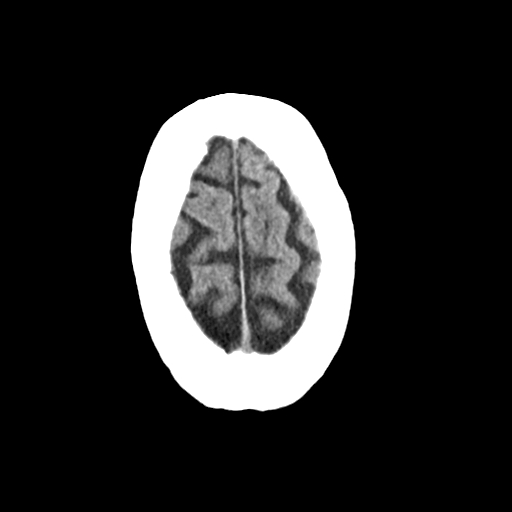
[im 28/33  bone]
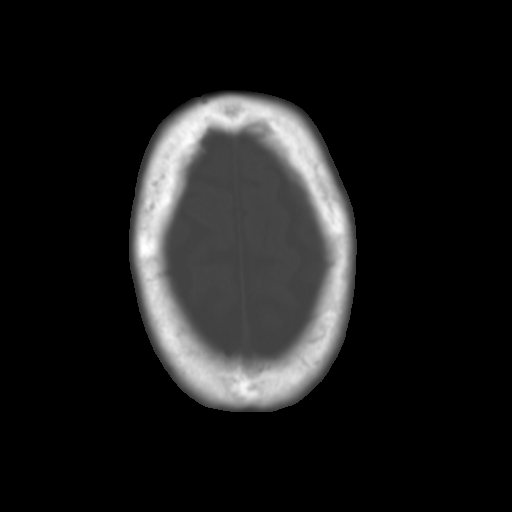
[im 30/33  brain]
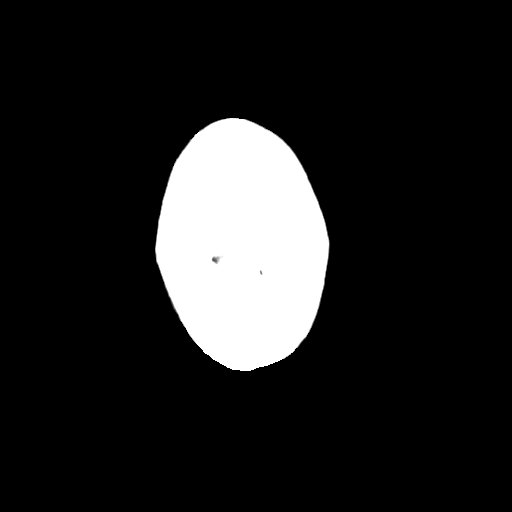

[Series 4: head 3.00 hr40 s3 sag · sagittal · 0.34mm/px · 3 of 57 slices shown]
[im 19/57  brain]
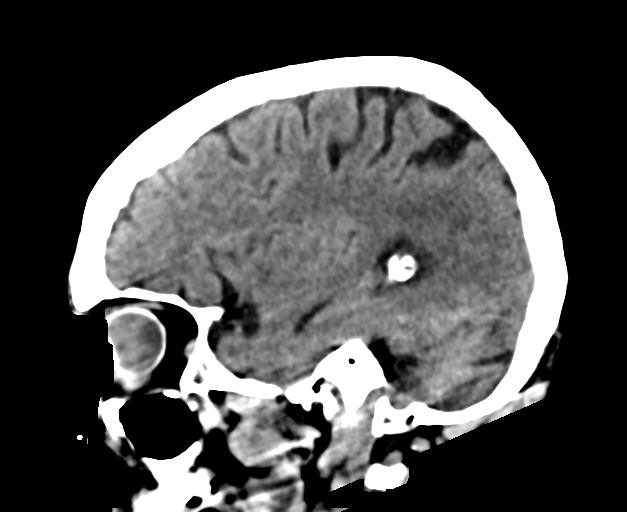
[im 29/57  brain]
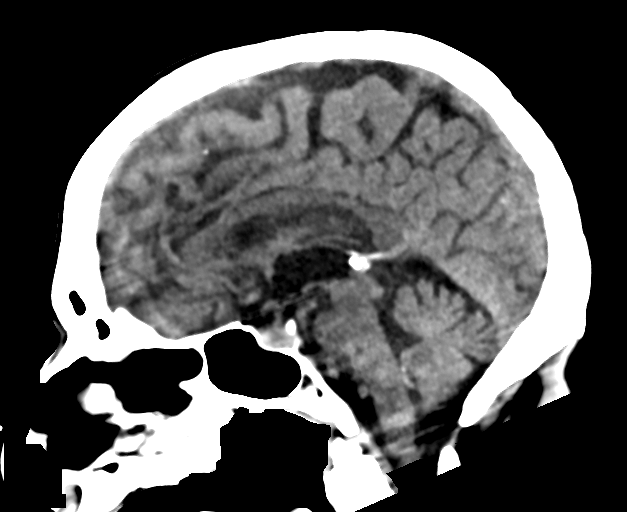
[im 38/57  brain]
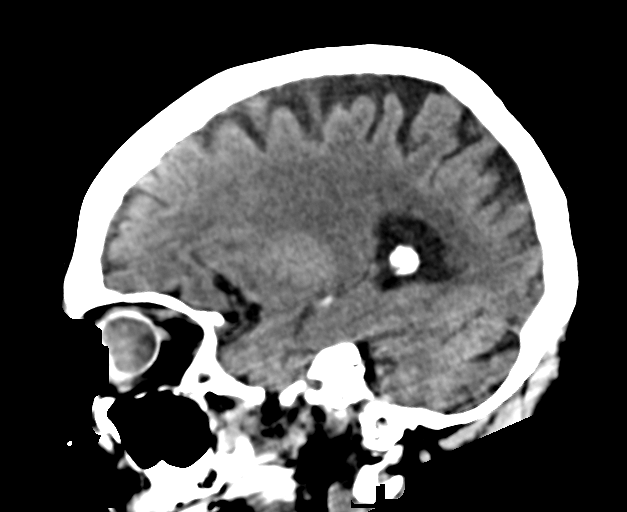

[Series 6: head 3.00 hr40 s3 cor · coronal · 0.34mm/px · 3 of 70 slices shown]
[im 24/70  brain]
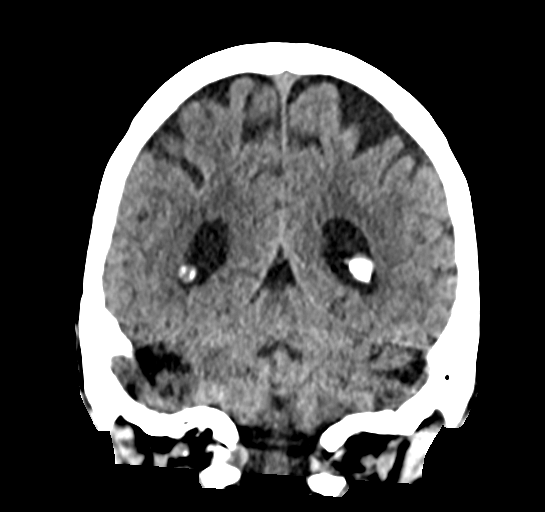
[im 31/70  brain]
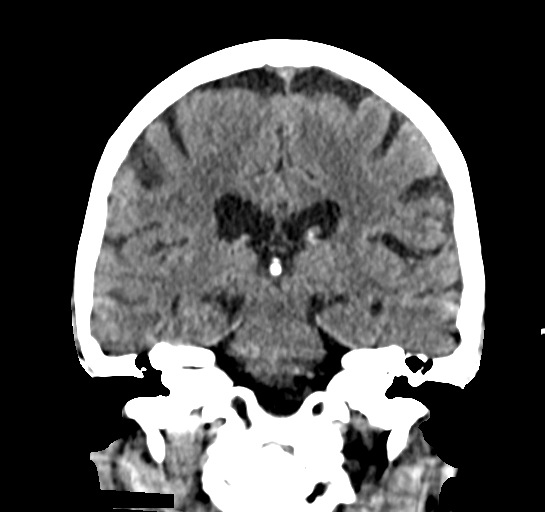
[im 39/70  brain]
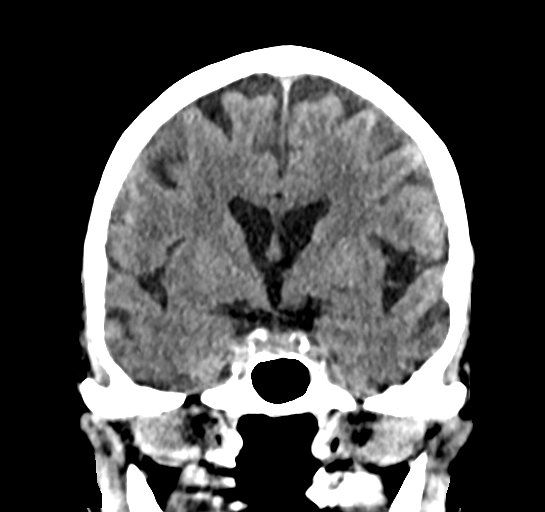

[16 of 47 positions shown; findings below may reference images not displayed]

FINDINGS: Brain: Generalized atrophy. Normal ventricular morphology. No
midline shift or mass effect. Otherwise normal appearance of brain
parenchyma. No intracranial hemorrhage, mass lesion or evidence of
acute infarction. No extra-axial fluid collections.

Vascular: No hyperdense vessels. Atherosclerotic calcifications of
internal carotid arteries bilaterally at skull base

Skull: Intact

Sinuses/Orbits: Clear

Other: N/A
IMPRESSION: Generalized atrophy.

No acute intracranial abnormalities.

## 2021-08-14 IMAGING — DX DG CHEST 2V
2 series · 2 of 2 positions shown · non-contrast
Comparison: 07/12/2017

CLINICAL DATA: COPD

EXAM:
CHEST - 2 VIEW

[chest pa]
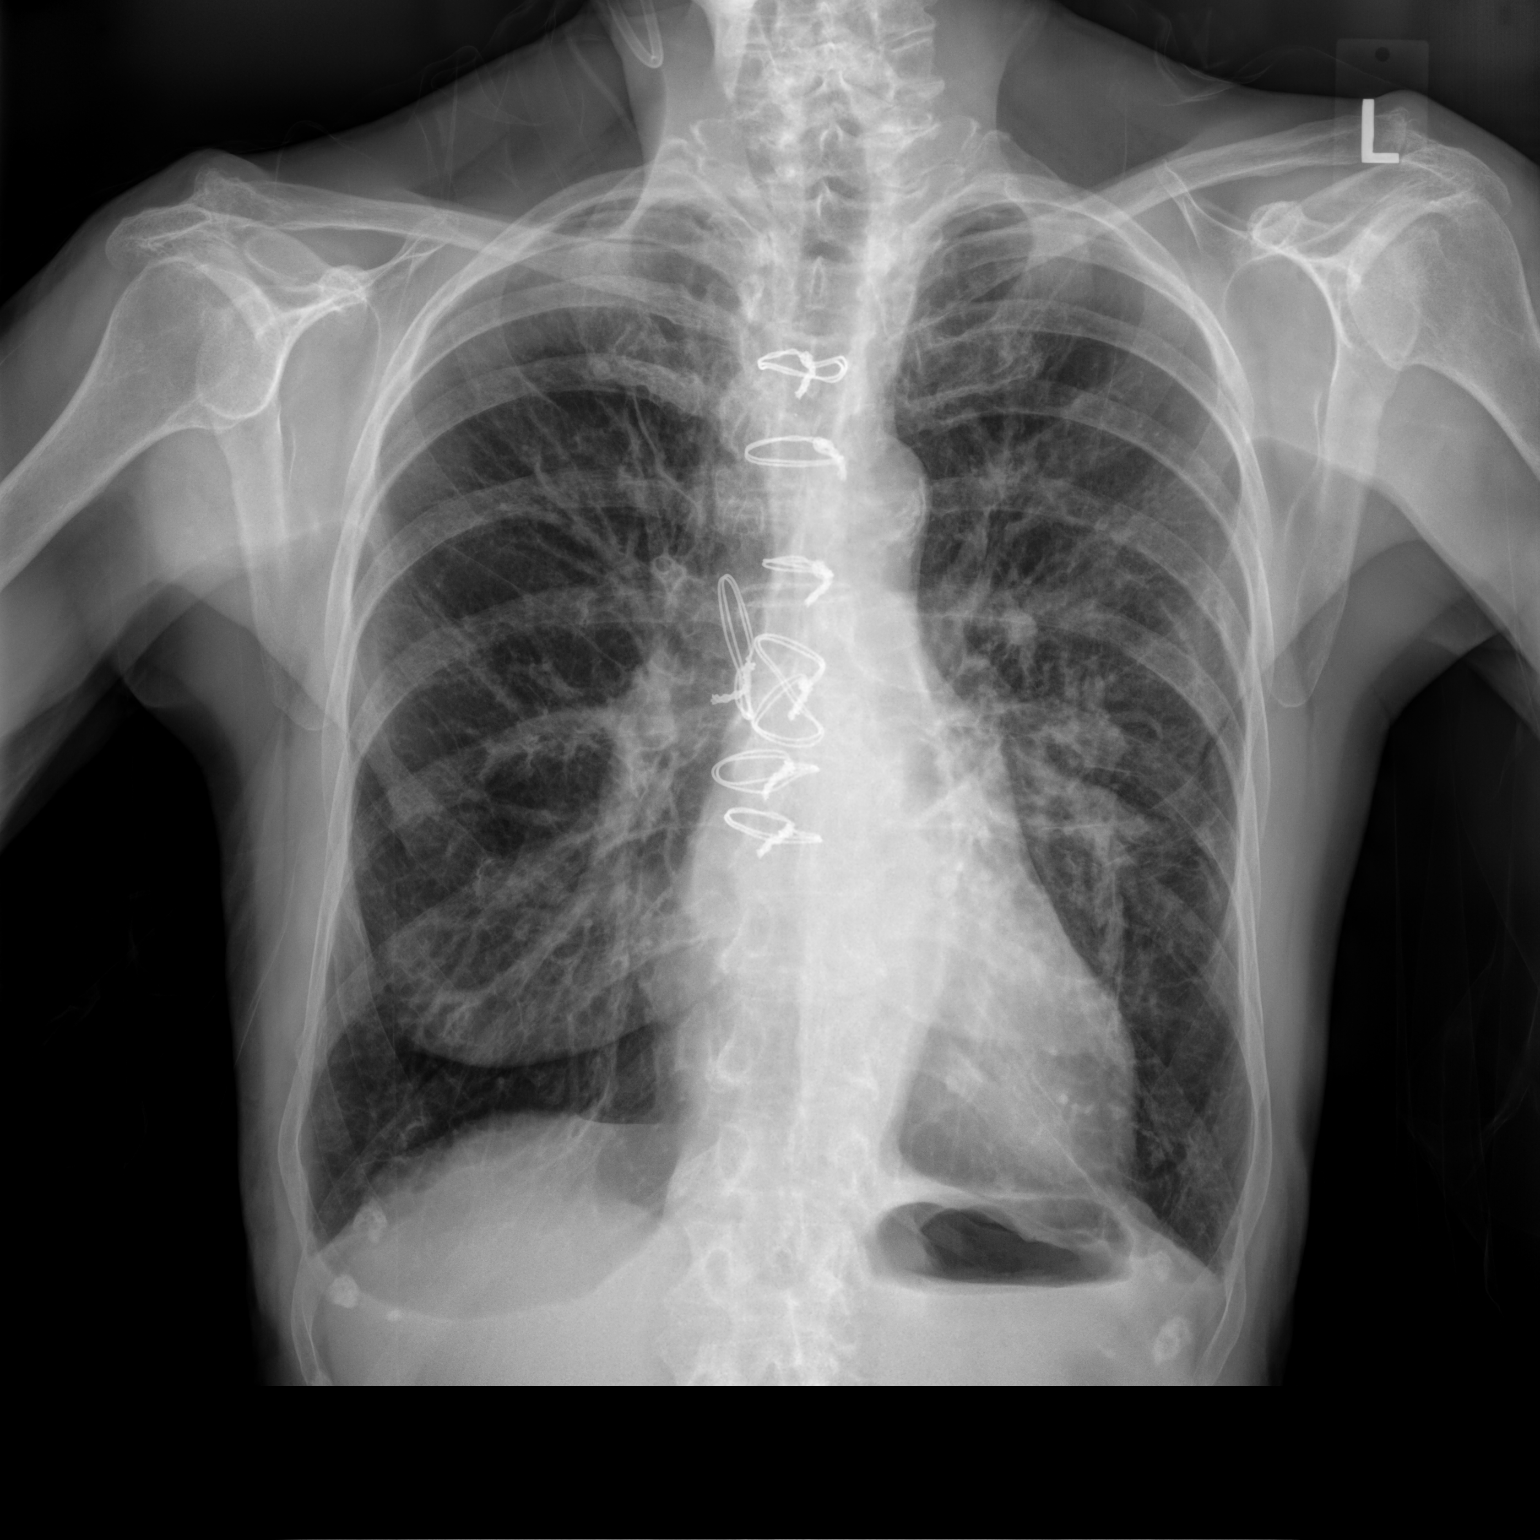

[chest lat]
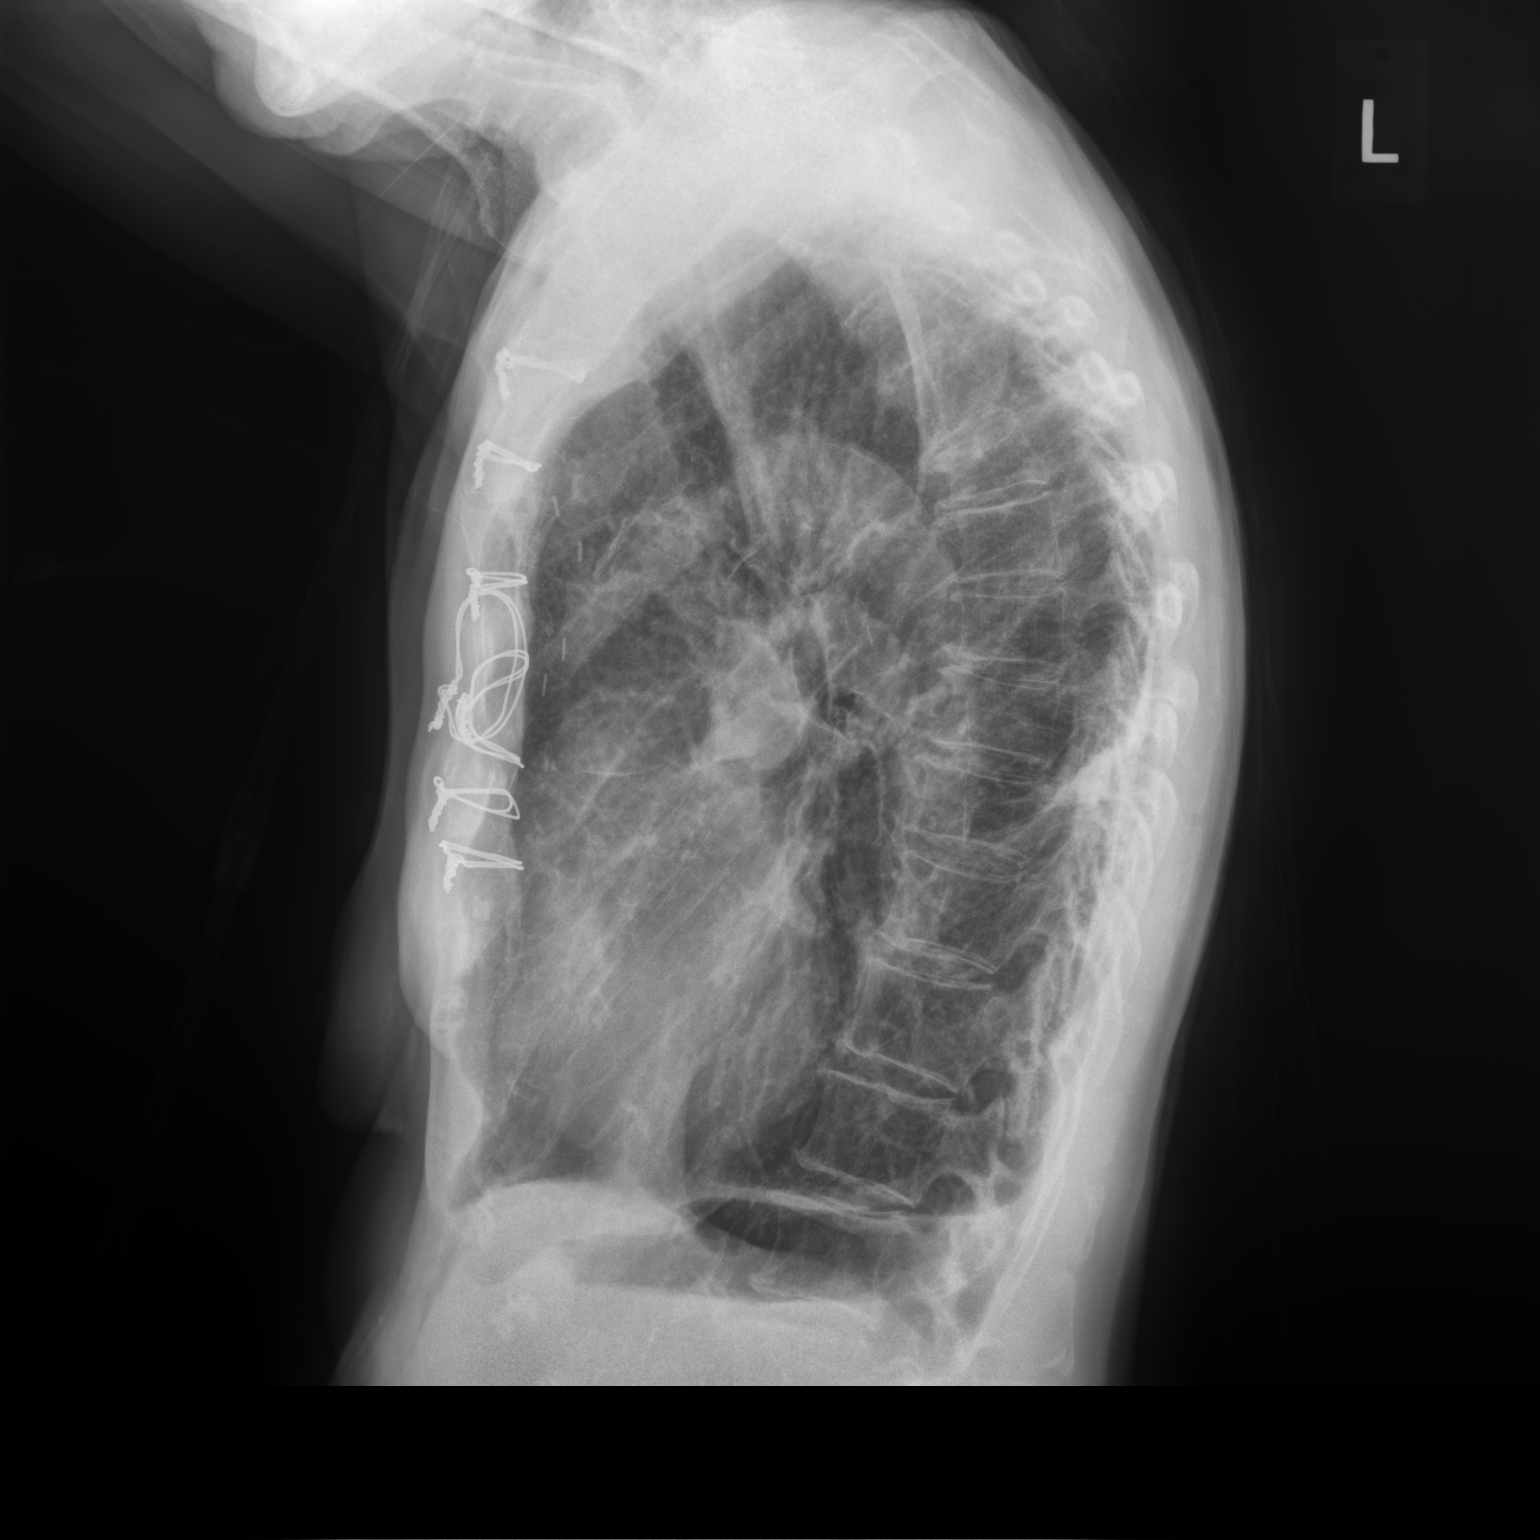

[2 of 2 positions shown; findings below may reference images not displayed]

FINDINGS: Cardiac shadow is within normal limits. Postoperative changes are
seen. The lungs are hyperinflated. Mild scarring is noted in the
bases bilaterally. Stable compression deformity in the upper
thoracic spine is again seen. No focal infiltrate is noted. Mild
chronic blunting of the posterior costophrenic angle on the left is
seen. No acute abnormality noted.
IMPRESSION: COPD without acute abnormality.

## 2021-09-03 ENCOUNTER — Other Ambulatory Visit: Payer: Self-pay | Admitting: *Deleted

## 2021-09-22 ENCOUNTER — Ambulatory Visit: Payer: Self-pay | Admitting: Radiation Oncology
